# Patient Record
Sex: Female | Born: 1950
Health system: Southern US, Community
[De-identification: ages and names within clinical notes are randomized; demographics above are authoritative.]

## PROBLEM LIST (undated history)

## (undated) DIAGNOSIS — Z8042 Family history of malignant neoplasm of prostate: Secondary | ICD-10-CM

## (undated) DIAGNOSIS — Z860100 Personal history of colon polyps, unspecified: Secondary | ICD-10-CM

## (undated) DIAGNOSIS — Z8041 Family history of malignant neoplasm of ovary: Secondary | ICD-10-CM

## (undated) DIAGNOSIS — T7840XA Allergy, unspecified, initial encounter: Secondary | ICD-10-CM

## (undated) DIAGNOSIS — F32A Depression, unspecified: Secondary | ICD-10-CM

## (undated) DIAGNOSIS — C801 Malignant (primary) neoplasm, unspecified: Secondary | ICD-10-CM

## (undated) DIAGNOSIS — H8110 Benign paroxysmal vertigo, unspecified ear: Secondary | ICD-10-CM

## (undated) DIAGNOSIS — E785 Hyperlipidemia, unspecified: Secondary | ICD-10-CM

## (undated) DIAGNOSIS — IMO0001 Reserved for inherently not codable concepts without codable children: Secondary | ICD-10-CM

## (undated) DIAGNOSIS — F419 Anxiety disorder, unspecified: Secondary | ICD-10-CM

## (undated) DIAGNOSIS — F329 Major depressive disorder, single episode, unspecified: Secondary | ICD-10-CM

## (undated) DIAGNOSIS — Z803 Family history of malignant neoplasm of breast: Secondary | ICD-10-CM

## (undated) DIAGNOSIS — E669 Obesity, unspecified: Secondary | ICD-10-CM

## (undated) DIAGNOSIS — M1990 Unspecified osteoarthritis, unspecified site: Secondary | ICD-10-CM

## (undated) DIAGNOSIS — K219 Gastro-esophageal reflux disease without esophagitis: Secondary | ICD-10-CM

## (undated) DIAGNOSIS — IMO0002 Reserved for concepts with insufficient information to code with codable children: Secondary | ICD-10-CM

## (undated) DIAGNOSIS — M545 Low back pain: Secondary | ICD-10-CM

## (undated) DIAGNOSIS — M8580 Other specified disorders of bone density and structure, unspecified site: Secondary | ICD-10-CM

## (undated) DIAGNOSIS — Z9221 Personal history of antineoplastic chemotherapy: Secondary | ICD-10-CM

## (undated) DIAGNOSIS — C541 Malignant neoplasm of endometrium: Secondary | ICD-10-CM

## (undated) DIAGNOSIS — E119 Type 2 diabetes mellitus without complications: Secondary | ICD-10-CM

## (undated) DIAGNOSIS — R7303 Prediabetes: Secondary | ICD-10-CM

## (undated) DIAGNOSIS — I10 Essential (primary) hypertension: Secondary | ICD-10-CM

## (undated) DIAGNOSIS — Z800 Family history of malignant neoplasm of digestive organs: Secondary | ICD-10-CM

## (undated) DIAGNOSIS — Z8719 Personal history of other diseases of the digestive system: Secondary | ICD-10-CM

## (undated) DIAGNOSIS — E079 Disorder of thyroid, unspecified: Secondary | ICD-10-CM

## (undated) DIAGNOSIS — I8310 Varicose veins of unspecified lower extremity with inflammation: Secondary | ICD-10-CM

## (undated) HISTORY — DX: Depression, unspecified: F32.A

## (undated) HISTORY — DX: Personal history of other diseases of the digestive system: Z87.19

## (undated) HISTORY — DX: Low back pain: M54.5

## (undated) HISTORY — PX: TUBAL LIGATION: SHX77

## (undated) HISTORY — PX: COLONOSCOPY: SHX174

## (undated) HISTORY — DX: Disorder of thyroid, unspecified: E07.9

## (undated) HISTORY — PX: POLYPECTOMY: SHX149

## (undated) HISTORY — DX: Hyperlipidemia, unspecified: E78.5

## (undated) HISTORY — DX: Varicose veins of unspecified lower extremity with inflammation: I83.10

## (undated) HISTORY — DX: Family history of malignant neoplasm of ovary: Z80.41

## (undated) HISTORY — DX: Family history of malignant neoplasm of prostate: Z80.42

## (undated) HISTORY — DX: Personal history of colonic polyps: Z86.010

## (undated) HISTORY — DX: Essential (primary) hypertension: I10

## (undated) HISTORY — DX: Major depressive disorder, single episode, unspecified: F32.9

## (undated) HISTORY — DX: Family history of malignant neoplasm of digestive organs: Z80.0

## (undated) HISTORY — DX: Family history of malignant neoplasm of breast: Z80.3

## (undated) HISTORY — DX: Prediabetes: R73.03

## (undated) HISTORY — DX: Obesity, unspecified: E66.9

## (undated) HISTORY — DX: Other specified disorders of bone density and structure, unspecified site: M85.80

## (undated) HISTORY — PX: DILATION AND CURETTAGE OF UTERUS: SHX78

## (undated) HISTORY — DX: Gastro-esophageal reflux disease without esophagitis: K21.9

## (undated) HISTORY — DX: Type 2 diabetes mellitus without complications: E11.9

## (undated) HISTORY — DX: Benign paroxysmal vertigo, unspecified ear: H81.10

## (undated) HISTORY — DX: Personal history of colon polyps, unspecified: Z86.0100

## (undated) HISTORY — DX: Allergy, unspecified, initial encounter: T78.40XA

## (undated) HISTORY — DX: Unspecified osteoarthritis, unspecified site: M19.90

---

## 1989-07-23 HISTORY — PX: CHOLECYSTECTOMY: SHX55

## 1999-05-22 ENCOUNTER — Other Ambulatory Visit: Admission: RE | Admit: 1999-05-22 | Discharge: 1999-05-22 | Payer: Self-pay | Admitting: Internal Medicine

## 2000-06-17 ENCOUNTER — Other Ambulatory Visit: Admission: RE | Admit: 2000-06-17 | Discharge: 2000-06-17 | Payer: Self-pay | Admitting: Internal Medicine

## 2001-03-03 ENCOUNTER — Ambulatory Visit (HOSPITAL_COMMUNITY): Admission: RE | Admit: 2001-03-03 | Discharge: 2001-03-03 | Payer: Self-pay | Admitting: Gastroenterology

## 2001-03-03 ENCOUNTER — Encounter (INDEPENDENT_AMBULATORY_CARE_PROVIDER_SITE_OTHER): Payer: Self-pay | Admitting: *Deleted

## 2001-07-08 ENCOUNTER — Other Ambulatory Visit: Admission: RE | Admit: 2001-07-08 | Discharge: 2001-07-08 | Payer: Self-pay | Admitting: Neurosurgery

## 2002-11-11 ENCOUNTER — Ambulatory Visit (HOSPITAL_COMMUNITY): Admission: RE | Admit: 2002-11-11 | Discharge: 2002-11-11 | Payer: Self-pay | Admitting: Urology

## 2002-11-29 ENCOUNTER — Ambulatory Visit (HOSPITAL_COMMUNITY): Admission: RE | Admit: 2002-11-29 | Discharge: 2002-11-29 | Payer: Self-pay | Admitting: Internal Medicine

## 2002-11-29 ENCOUNTER — Encounter: Payer: Self-pay | Admitting: Internal Medicine

## 2003-01-27 ENCOUNTER — Encounter (INDEPENDENT_AMBULATORY_CARE_PROVIDER_SITE_OTHER): Payer: Self-pay | Admitting: Specialist

## 2003-01-27 ENCOUNTER — Ambulatory Visit (HOSPITAL_COMMUNITY): Admission: RE | Admit: 2003-01-27 | Discharge: 2003-01-27 | Payer: Self-pay

## 2003-08-13 ENCOUNTER — Other Ambulatory Visit: Admission: RE | Admit: 2003-08-13 | Discharge: 2003-08-13 | Payer: Self-pay | Admitting: Obstetrics and Gynecology

## 2004-08-16 ENCOUNTER — Other Ambulatory Visit: Admission: RE | Admit: 2004-08-16 | Discharge: 2004-08-16 | Payer: Self-pay | Admitting: Internal Medicine

## 2005-08-31 ENCOUNTER — Other Ambulatory Visit: Admission: RE | Admit: 2005-08-31 | Discharge: 2005-08-31 | Payer: Self-pay | Admitting: Internal Medicine

## 2006-02-01 ENCOUNTER — Ambulatory Visit: Payer: Self-pay | Admitting: Family Medicine

## 2006-03-04 ENCOUNTER — Ambulatory Visit: Payer: Self-pay | Admitting: Family Medicine

## 2006-03-18 ENCOUNTER — Ambulatory Visit: Payer: Self-pay | Admitting: Internal Medicine

## 2006-03-19 ENCOUNTER — Ambulatory Visit: Payer: Self-pay | Admitting: *Deleted

## 2006-03-29 ENCOUNTER — Ambulatory Visit: Payer: Self-pay | Admitting: Family Medicine

## 2006-05-10 ENCOUNTER — Ambulatory Visit: Payer: Self-pay | Admitting: Family Medicine

## 2006-05-10 LAB — CONVERTED CEMR LAB
CO2: 31 meq/L (ref 19–32)
Chloride: 102 meq/L (ref 96–112)
Creatinine, Ser: 0.8 mg/dL (ref 0.4–1.2)
GFR calc non Af Amer: 79 mL/min
Glomerular Filtration Rate, Af Am: 96 mL/min/{1.73_m2}
Potassium: 3.8 meq/L (ref 3.5–5.1)

## 2006-08-13 DIAGNOSIS — J45909 Unspecified asthma, uncomplicated: Secondary | ICD-10-CM | POA: Insufficient documentation

## 2006-08-13 DIAGNOSIS — I10 Essential (primary) hypertension: Secondary | ICD-10-CM

## 2006-08-13 HISTORY — DX: Essential (primary) hypertension: I10

## 2006-08-28 ENCOUNTER — Ambulatory Visit: Payer: Self-pay | Admitting: Family Medicine

## 2006-08-30 ENCOUNTER — Emergency Department (HOSPITAL_COMMUNITY): Admission: EM | Admit: 2006-08-30 | Discharge: 2006-08-30 | Payer: Self-pay | Admitting: Emergency Medicine

## 2006-09-02 ENCOUNTER — Ambulatory Visit: Payer: Self-pay | Admitting: Family Medicine

## 2006-09-05 ENCOUNTER — Ambulatory Visit: Payer: Self-pay | Admitting: Family Medicine

## 2006-09-13 ENCOUNTER — Ambulatory Visit: Payer: Self-pay | Admitting: Family Medicine

## 2006-09-13 LAB — CONVERTED CEMR LAB
CO2: 31 meq/L (ref 19–32)
Calcium: 9.4 mg/dL (ref 8.4–10.5)
Chloride: 104 meq/L (ref 96–112)
Cholesterol: 222 mg/dL (ref 0–200)
GFR calc Af Amer: 96 mL/min
GFR calc non Af Amer: 79 mL/min
Potassium: 3.9 meq/L (ref 3.5–5.1)
Sodium: 142 meq/L (ref 135–145)
Total CHOL/HDL Ratio: 3.7
VLDL: 20 mg/dL (ref 0–40)

## 2006-10-04 ENCOUNTER — Ambulatory Visit: Payer: Self-pay | Admitting: Gastroenterology

## 2006-10-18 ENCOUNTER — Ambulatory Visit: Payer: Self-pay | Admitting: Gastroenterology

## 2006-11-15 ENCOUNTER — Ambulatory Visit: Payer: Self-pay | Admitting: Family Medicine

## 2006-11-15 LAB — CONVERTED CEMR LAB
Cholesterol: 169 mg/dL (ref 0–200)
HDL: 51.6 mg/dL (ref >39.0)
Total CHOL/HDL Ratio: 3.3
VLDL: 16 mg/dL (ref 0–40)

## 2007-01-14 ENCOUNTER — Telehealth (INDEPENDENT_AMBULATORY_CARE_PROVIDER_SITE_OTHER): Payer: Self-pay | Admitting: *Deleted

## 2007-01-15 ENCOUNTER — Ambulatory Visit: Payer: Self-pay | Admitting: Vascular Surgery

## 2007-01-15 ENCOUNTER — Ambulatory Visit: Payer: Self-pay | Admitting: Internal Medicine

## 2007-01-15 DIAGNOSIS — L02419 Cutaneous abscess of limb, unspecified: Secondary | ICD-10-CM

## 2007-01-15 DIAGNOSIS — L03119 Cellulitis of unspecified part of limb: Secondary | ICD-10-CM

## 2007-01-16 ENCOUNTER — Telehealth: Payer: Self-pay | Admitting: Internal Medicine

## 2007-01-17 ENCOUNTER — Encounter: Payer: Self-pay | Admitting: Internal Medicine

## 2007-01-23 ENCOUNTER — Ambulatory Visit: Payer: Self-pay | Admitting: Internal Medicine

## 2007-01-29 ENCOUNTER — Telehealth: Payer: Self-pay | Admitting: Internal Medicine

## 2007-01-30 ENCOUNTER — Encounter: Payer: Self-pay | Admitting: Internal Medicine

## 2007-01-30 ENCOUNTER — Encounter (INDEPENDENT_AMBULATORY_CARE_PROVIDER_SITE_OTHER): Payer: Self-pay | Admitting: *Deleted

## 2007-01-30 ENCOUNTER — Ambulatory Visit: Payer: Self-pay

## 2007-01-31 ENCOUNTER — Encounter: Payer: Self-pay | Admitting: Internal Medicine

## 2007-02-03 ENCOUNTER — Ambulatory Visit: Payer: Self-pay | Admitting: Internal Medicine

## 2007-02-03 ENCOUNTER — Encounter (INDEPENDENT_AMBULATORY_CARE_PROVIDER_SITE_OTHER): Payer: Self-pay | Admitting: *Deleted

## 2007-02-11 ENCOUNTER — Ambulatory Visit: Payer: Self-pay | Admitting: Family Medicine

## 2007-03-20 ENCOUNTER — Ambulatory Visit: Payer: Self-pay | Admitting: Family Medicine

## 2007-03-25 ENCOUNTER — Telehealth (INDEPENDENT_AMBULATORY_CARE_PROVIDER_SITE_OTHER): Payer: Self-pay | Admitting: *Deleted

## 2007-03-25 LAB — CONVERTED CEMR LAB
Calcium: 9.4 mg/dL (ref 8.4–10.5)
Cholesterol: 190 mg/dL (ref 0–200)
VLDL: 16 mg/dL (ref 0–40)

## 2007-06-24 ENCOUNTER — Ambulatory Visit: Payer: Self-pay | Admitting: Family Medicine

## 2007-06-24 ENCOUNTER — Encounter: Payer: Self-pay | Admitting: Family Medicine

## 2007-06-24 LAB — CONVERTED CEMR LAB
BUN: 10 mg/dL (ref 6–23)
CO2: 31 meq/L (ref 19–32)
Chloride: 103 meq/L (ref 96–112)
Creatinine, Ser: 0.9 mg/dL (ref 0.4–1.2)
GFR calc Af Amer: 84 mL/min
Potassium: 4.4 meq/L (ref 3.5–5.1)

## 2007-06-25 ENCOUNTER — Telehealth (INDEPENDENT_AMBULATORY_CARE_PROVIDER_SITE_OTHER): Payer: Self-pay | Admitting: *Deleted

## 2007-07-02 ENCOUNTER — Telehealth (INDEPENDENT_AMBULATORY_CARE_PROVIDER_SITE_OTHER): Payer: Self-pay | Admitting: *Deleted

## 2007-08-11 ENCOUNTER — Encounter (INDEPENDENT_AMBULATORY_CARE_PROVIDER_SITE_OTHER): Payer: Self-pay | Admitting: Family Medicine

## 2007-09-26 ENCOUNTER — Other Ambulatory Visit: Admission: RE | Admit: 2007-09-26 | Discharge: 2007-09-26 | Payer: Self-pay | Admitting: Family Medicine

## 2007-09-26 ENCOUNTER — Encounter (INDEPENDENT_AMBULATORY_CARE_PROVIDER_SITE_OTHER): Payer: Self-pay | Admitting: *Deleted

## 2007-09-26 ENCOUNTER — Ambulatory Visit: Payer: Self-pay | Admitting: Family Medicine

## 2007-09-26 ENCOUNTER — Encounter (INDEPENDENT_AMBULATORY_CARE_PROVIDER_SITE_OTHER): Payer: Self-pay | Admitting: Family Medicine

## 2007-09-26 LAB — CONVERTED CEMR LAB
BUN: 11 mg/dL (ref 6–23)
Calcium: 9.7 mg/dL (ref 8.4–10.5)
Cholesterol: 207 mg/dL (ref 0–200)
Creatinine, Ser: 0.9 mg/dL (ref 0.4–1.2)
Direct LDL: 124.4 mg/dL
GFR calc non Af Amer: 69 mL/min
HDL: 53.9 mg/dL (ref >39.0)
Hemoglobin: 13.1 g/dL (ref 12.0–15.0)
Potassium: 4.2 meq/L (ref 3.5–5.1)
Sodium: 142 meq/L (ref 135–145)
TSH: 3.06 microintl units/mL (ref 0.35–5.50)
Triglycerides: 114 mg/dL (ref 0–149)
VLDL: 23 mg/dL (ref 0–40)

## 2007-10-01 ENCOUNTER — Telehealth (INDEPENDENT_AMBULATORY_CARE_PROVIDER_SITE_OTHER): Payer: Self-pay | Admitting: *Deleted

## 2007-11-25 ENCOUNTER — Encounter (INDEPENDENT_AMBULATORY_CARE_PROVIDER_SITE_OTHER): Payer: Self-pay | Admitting: *Deleted

## 2007-12-19 ENCOUNTER — Ambulatory Visit: Payer: Self-pay | Admitting: Internal Medicine

## 2007-12-19 DIAGNOSIS — R1031 Right lower quadrant pain: Secondary | ICD-10-CM

## 2007-12-19 LAB — CONVERTED CEMR LAB
Eosinophils Absolute: 0.1 10*3/uL (ref 0.0–0.7)
Eosinophils Relative: 2 % (ref 0–5)
Hemoglobin: 12 g/dL (ref 12.0–15.0)
Lymphs Abs: 2 10*3/uL (ref 0.7–4.0)
MCV: 66.3 fL — ABNORMAL LOW (ref 78.0–100.0)
Neutrophils Relative %: 43 % (ref 43–77)
Platelets: 260 10*3/uL (ref 150–400)
RBC: 5.87 M/uL — ABNORMAL HIGH (ref 3.87–5.11)
WBC: 4.5 10*3/uL (ref 4.0–10.5)

## 2007-12-26 ENCOUNTER — Ambulatory Visit: Payer: Self-pay | Admitting: Internal Medicine

## 2008-01-13 ENCOUNTER — Encounter (INDEPENDENT_AMBULATORY_CARE_PROVIDER_SITE_OTHER): Payer: Self-pay | Admitting: *Deleted

## 2008-01-13 ENCOUNTER — Ambulatory Visit: Payer: Self-pay | Admitting: Internal Medicine

## 2008-01-13 DIAGNOSIS — R109 Unspecified abdominal pain: Secondary | ICD-10-CM | POA: Insufficient documentation

## 2008-01-14 ENCOUNTER — Ambulatory Visit: Payer: Self-pay | Admitting: Internal Medicine

## 2008-01-19 ENCOUNTER — Ambulatory Visit: Payer: Self-pay | Admitting: Cardiology

## 2008-01-20 ENCOUNTER — Ambulatory Visit: Payer: Self-pay | Admitting: Internal Medicine

## 2008-01-20 ENCOUNTER — Encounter (INDEPENDENT_AMBULATORY_CARE_PROVIDER_SITE_OTHER): Payer: Self-pay | Admitting: *Deleted

## 2008-01-20 LAB — CONVERTED CEMR LAB
OCCULT 1: NEGATIVE
OCCULT 2: NEGATIVE
OCCULT 3: NEGATIVE

## 2008-01-22 ENCOUNTER — Encounter (INDEPENDENT_AMBULATORY_CARE_PROVIDER_SITE_OTHER): Payer: Self-pay | Admitting: *Deleted

## 2008-01-22 LAB — CONVERTED CEMR LAB
ALT: 32 units/L (ref 0–35)
Albumin: 3.6 g/dL (ref 3.5–5.2)
Alkaline Phosphatase: 82 units/L (ref 39–117)
Bilirubin, Direct: 0.1 mg/dL (ref 0.0–0.3)
HCT: 38.2 % (ref 36.0–46.0)
Lipase: 12 units/L (ref 11.0–59.0)
MCV: 66.7 fL — ABNORMAL LOW (ref 78.0–100.0)
Neutrophils Relative %: 47.4 % (ref 43.0–77.0)
RDW: 15.1 % — ABNORMAL HIGH (ref 11.5–14.6)
Total Bilirubin: 0.6 mg/dL (ref 0.3–1.2)

## 2008-01-26 ENCOUNTER — Telehealth (INDEPENDENT_AMBULATORY_CARE_PROVIDER_SITE_OTHER): Payer: Self-pay | Admitting: *Deleted

## 2008-01-29 ENCOUNTER — Encounter (INDEPENDENT_AMBULATORY_CARE_PROVIDER_SITE_OTHER): Payer: Self-pay | Admitting: *Deleted

## 2008-03-10 DIAGNOSIS — Z860100 Personal history of colon polyps, unspecified: Secondary | ICD-10-CM

## 2008-03-10 HISTORY — DX: Personal history of colon polyps, unspecified: Z86.0100

## 2008-03-11 ENCOUNTER — Ambulatory Visit: Payer: Self-pay | Admitting: Gastroenterology

## 2008-03-11 DIAGNOSIS — E669 Obesity, unspecified: Secondary | ICD-10-CM | POA: Insufficient documentation

## 2008-03-11 DIAGNOSIS — K766 Portal hypertension: Secondary | ICD-10-CM

## 2008-04-16 ENCOUNTER — Ambulatory Visit: Payer: Self-pay | Admitting: Family Medicine

## 2008-05-07 ENCOUNTER — Ambulatory Visit: Payer: Self-pay | Admitting: Family Medicine

## 2008-05-25 ENCOUNTER — Telehealth (INDEPENDENT_AMBULATORY_CARE_PROVIDER_SITE_OTHER): Payer: Self-pay | Admitting: *Deleted

## 2008-08-06 ENCOUNTER — Ambulatory Visit: Payer: Self-pay | Admitting: Family Medicine

## 2008-08-13 ENCOUNTER — Encounter: Payer: Self-pay | Admitting: Family Medicine

## 2008-08-16 ENCOUNTER — Telehealth (INDEPENDENT_AMBULATORY_CARE_PROVIDER_SITE_OTHER): Payer: Self-pay | Admitting: *Deleted

## 2008-08-23 ENCOUNTER — Encounter (INDEPENDENT_AMBULATORY_CARE_PROVIDER_SITE_OTHER): Payer: Self-pay | Admitting: *Deleted

## 2008-08-23 LAB — CONVERTED CEMR LAB
Albumin: 3.7 g/dL (ref 3.5–5.2)
BUN: 9 mg/dL (ref 6–23)
Bilirubin, Direct: 0.1 mg/dL (ref 0.0–0.3)
CO2: 31 meq/L (ref 19–32)
Calcium: 9.4 mg/dL (ref 8.4–10.5)
Chloride: 104 meq/L (ref 96–112)
Cholesterol: 196 mg/dL (ref 0–200)
Creatinine, Ser: 0.8 mg/dL (ref 0.4–1.2)
GFR calc Af Amer: 95 mL/min
LDL Cholesterol: 126 mg/dL — ABNORMAL HIGH (ref 0–99)
Potassium: 4.2 meq/L (ref 3.5–5.1)
Sodium: 142 meq/L (ref 135–145)
Total Bilirubin: 0.5 mg/dL (ref 0.3–1.2)
Total Protein: 7.8 g/dL (ref 6.0–8.3)
Triglycerides: 104 mg/dL (ref 0–149)
VLDL: 21 mg/dL (ref 0–40)

## 2008-09-30 ENCOUNTER — Encounter: Payer: Self-pay | Admitting: Family Medicine

## 2008-10-01 ENCOUNTER — Ambulatory Visit: Payer: Self-pay | Admitting: Family Medicine

## 2008-10-01 ENCOUNTER — Encounter: Payer: Self-pay | Admitting: Family Medicine

## 2008-10-01 ENCOUNTER — Other Ambulatory Visit: Admission: RE | Admit: 2008-10-01 | Discharge: 2008-10-01 | Payer: Self-pay | Admitting: Family Medicine

## 2008-10-01 DIAGNOSIS — Z780 Asymptomatic menopausal state: Secondary | ICD-10-CM

## 2008-10-01 HISTORY — DX: Asymptomatic menopausal state: Z78.0

## 2008-10-08 ENCOUNTER — Ambulatory Visit (HOSPITAL_COMMUNITY): Admission: RE | Admit: 2008-10-08 | Discharge: 2008-10-08 | Payer: Self-pay | Admitting: Family Medicine

## 2008-10-08 ENCOUNTER — Encounter: Payer: Self-pay | Admitting: Family Medicine

## 2008-10-11 ENCOUNTER — Encounter (INDEPENDENT_AMBULATORY_CARE_PROVIDER_SITE_OTHER): Payer: Self-pay | Admitting: *Deleted

## 2008-10-18 ENCOUNTER — Encounter (INDEPENDENT_AMBULATORY_CARE_PROVIDER_SITE_OTHER): Payer: Self-pay | Admitting: *Deleted

## 2009-04-26 ENCOUNTER — Ambulatory Visit: Payer: Self-pay | Admitting: Family Medicine

## 2009-12-06 ENCOUNTER — Encounter: Payer: Self-pay | Admitting: Emergency Medicine

## 2009-12-06 ENCOUNTER — Inpatient Hospital Stay (HOSPITAL_COMMUNITY): Admission: EM | Admit: 2009-12-06 | Discharge: 2009-12-08 | Payer: Self-pay | Admitting: Internal Medicine

## 2009-12-06 ENCOUNTER — Ambulatory Visit: Payer: Self-pay | Admitting: Internal Medicine

## 2009-12-06 ENCOUNTER — Ambulatory Visit: Payer: Self-pay | Admitting: Diagnostic Radiology

## 2009-12-08 ENCOUNTER — Encounter: Payer: Self-pay | Admitting: Internal Medicine

## 2009-12-13 ENCOUNTER — Encounter: Payer: Self-pay | Admitting: Emergency Medicine

## 2009-12-13 ENCOUNTER — Ambulatory Visit: Payer: Self-pay | Admitting: Diagnostic Radiology

## 2009-12-13 ENCOUNTER — Observation Stay (HOSPITAL_COMMUNITY): Admission: AD | Admit: 2009-12-13 | Discharge: 2009-12-15 | Payer: Self-pay | Admitting: Internal Medicine

## 2010-01-10 ENCOUNTER — Ambulatory Visit (HOSPITAL_BASED_OUTPATIENT_CLINIC_OR_DEPARTMENT_OTHER)
Admission: RE | Admit: 2010-01-10 | Discharge: 2010-01-10 | Payer: Self-pay | Source: Home / Self Care | Admitting: Internal Medicine

## 2010-01-10 ENCOUNTER — Ambulatory Visit: Payer: Self-pay | Admitting: Diagnostic Radiology

## 2010-06-12 ENCOUNTER — Encounter: Payer: Self-pay | Admitting: Internal Medicine

## 2010-06-12 LAB — CONVERTED CEMR LAB: Pap Smear: NORMAL

## 2010-08-15 ENCOUNTER — Ambulatory Visit (INDEPENDENT_AMBULATORY_CARE_PROVIDER_SITE_OTHER)
Admission: RE | Admit: 2010-08-15 | Discharge: 2010-08-15 | Payer: Private Health Insurance - Indemnity | Source: Home / Self Care | Attending: Internal Medicine | Admitting: Internal Medicine

## 2010-08-15 DIAGNOSIS — Z1231 Encounter for screening mammogram for malignant neoplasm of breast: Secondary | ICD-10-CM

## 2010-08-15 LAB — HM MAMMOGRAPHY: HM Mammogram: NORMAL

## 2010-08-20 LAB — CONVERTED CEMR LAB
AST: 24 units/L (ref 0–37)
Albumin: 3.8 g/dL (ref 3.5–5.2)
Basophils Absolute: 0.1 10*3/uL (ref 0.0–0.1)
Basophils Relative: 1.5 % (ref 0.0–3.0)
Blood in Urine, dipstick: NEGATIVE
CO2: 31 meq/L (ref 19–32)
Cholesterol: 195 mg/dL (ref 0–200)
Eosinophils Absolute: 0.1 10*3/uL (ref 0.0–0.7)
GFR calc Af Amer: 95 mL/min
GFR calc non Af Amer: 79 mL/min
HCT: 39.9 % (ref 36.0–46.0)
HDL: 49.4 mg/dL (ref >39.0)
Monocytes Absolute: 0.5 10*3/uL (ref 0.1–1.0)
Neutro Abs: 2.2 10*3/uL (ref 1.4–7.7)
Nitrite: NEGATIVE
Platelets: 216 10*3/uL (ref 150–400)
Protein, U semiquant: NEGATIVE
RDW: 15.5 % — ABNORMAL HIGH (ref 11.5–14.6)
TSH: 3.56 microintl units/mL (ref 0.35–5.50)
Total Protein: 7.6 g/dL (ref 6.0–8.3)
Urobilinogen, UA: NEGATIVE
VLDL: 21 mg/dL (ref 0–40)
WBC Urine, dipstick: NEGATIVE
WBC: 4.9 10*3/uL (ref 4.5–10.5)
pH: 7

## 2010-08-22 NOTE — Letter (Signed)
Summary: Chief Financial Officer Insurance Certification Notification   Imported By: Roderic Ovens 02/07/2010 11:54:24  _____________________________________________________________________  External Attachment:    Type:   Image     Comment:   External Document

## 2010-09-14 ENCOUNTER — Ambulatory Visit (INDEPENDENT_AMBULATORY_CARE_PROVIDER_SITE_OTHER): Payer: Private Health Insurance - Indemnity | Admitting: Internal Medicine

## 2010-09-14 ENCOUNTER — Encounter: Payer: Self-pay | Admitting: Internal Medicine

## 2010-09-14 DIAGNOSIS — R7309 Other abnormal glucose: Secondary | ICD-10-CM | POA: Insufficient documentation

## 2010-09-14 DIAGNOSIS — Z8719 Personal history of other diseases of the digestive system: Secondary | ICD-10-CM

## 2010-09-14 DIAGNOSIS — K219 Gastro-esophageal reflux disease without esophagitis: Secondary | ICD-10-CM

## 2010-09-14 DIAGNOSIS — I10 Essential (primary) hypertension: Secondary | ICD-10-CM

## 2010-09-14 HISTORY — DX: Gastro-esophageal reflux disease without esophagitis: K21.9

## 2010-09-15 ENCOUNTER — Encounter: Payer: Self-pay | Admitting: Internal Medicine

## 2010-09-15 LAB — CONVERTED CEMR LAB
ALT: 22 units/L (ref 0–35)
BUN: 12 mg/dL (ref 6–23)
Cholesterol: 198 mg/dL (ref 0–200)
Free T4: 1.13 ng/dL (ref 0.80–1.80)
Glucose, Bld: 91 mg/dL (ref 70–99)
HDL: 57 mg/dL (ref >39)
MCHC: 31.5 g/dL (ref 30.0–36.0)
Platelets: 225 10*3/uL (ref 150–400)
Potassium: 4.5 meq/L (ref 3.5–5.3)
RBC: 5.96 M/uL — ABNORMAL HIGH (ref 3.87–5.11)
RDW: 15.9 % — ABNORMAL HIGH (ref 11.5–15.5)
Total Protein: 6.8 g/dL (ref 6.0–8.3)
WBC: 4.9 10*3/uL (ref 4.0–10.5)

## 2010-09-18 ENCOUNTER — Telehealth: Payer: Self-pay | Admitting: Internal Medicine

## 2010-09-18 ENCOUNTER — Encounter: Payer: Self-pay | Admitting: Internal Medicine

## 2010-09-28 NOTE — Progress Notes (Signed)
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  as discussed at previous visit, patient should avoid concentrated sweets and limit carbohydrate intake to 25-30 g per meal.   daily  walking program can also help progression to diabetes  Initial call taken by: D. Thomos Lemons DO,  September 18, 2010 3:15 PM  Follow-up for Phone Call        Pt notified. Nicki Guadalajara Fergerson CMA Duncan Dull)  September 18, 2010 4:11 PM

## 2010-09-28 NOTE — Letter (Signed)
    at [facility] [address] [place], [place]  [address]  [place] Phone: [phone]      September 18, 2010   [facility] [address] [place], [place] [address]  RE:  LAB RESULTS  Dear  Ms. Fikes,  The following is an interpretation of your most recent lab tests.  Please take note of any instructions provided or changes to medications that have resulted from your lab work.  ELECTROLYTES:  Good - no changes needed  KIDNEY FUNCTION TESTS:  Good - no changes needed  LIVER FUNCTION TESTS:  Good - no changes needed  LIPID PANEL:  Fair - review at your next visit Triglyceride: 77   Cholesterol: 198   LDL: 126   HDL: 57   Chol/HDL%:  3.5 Ratio  THYROID STUDIES:  Thyroid studies normal TSH: 3.985     DIABETIC STUDIES:  Fair - schedule a follow-up appointment Blood Glucose: 91   HgbA1C: 6.4     CBC:  Good - no changes needed       Sincerely Yours,    Dr. Thomos Lemons  Appended Document:  mailed

## 2010-10-03 NOTE — Assessment & Plan Note (Signed)
Summary: new to est/aetna/pkg/ss   Vital Signs:  Patient profile:   60 year old female Menstrual status:  postmenopausal Height:      62 inches Weight:      249.75 pounds BMI:     45.84 O2 Sat:      100 % on Room air Temp:     97.7 degrees F oral Pulse rate:   70 / minute Resp:     18 per minute BP sitting:   130 / 90  (left arm)  Vitals Entered By: Glendell Docker CMA (September 14, 2010 9:43 AM)  O2 Flow:  Room air CC: New Patient Is Patient Diabetic? No Pain Assessment Patient in pain? no      Comments general assessment     Menstrual Status postmenopausal Last PAP Result normal   Primary Care Provider:  Dondra Spry DO  CC:  New Patient.  History of Present Illness: 60 y/o female to establish she has been getting ESI for low back pain  she has been going to dermatologist  for dark spots on legs - lipodermatosclerosis  htn -  poor control despite taking current med regimen denies hx of CAD or CVA    Preventive Screening-Counseling & Management  Alcohol-Tobacco     Alcohol drinks/day: 0     Smoking Status: never  Caffeine-Diet-Exercise     Caffeine use/day: None     Does Patient Exercise: yes     Times/week: 3  Allergies: 1)  * Penicillins Group 2)  * Iodine; Iodine Containing Group  Past History:  Past Medical History: Asthma Hypertension Obestiy Colonic polyps, hx of GERD Diverticulitis, hx of   Past Surgical History: Cholecystectomy  1991 D&C Tubal ligation Appendectomy    Social History: Married- 40 years Never Smoked Alcohol use-no Drug use-no Daily Caffeine Use Occupation: housewife Caffeine use/day:  None Does Patient Exercise:  yes  Review of Systems       The patient complains of weight gain.  The patient denies fever, chest pain, syncope, abdominal pain, severe indigestion/heartburn, and depression.         snoring,  daytime somnolence  Physical Exam  General:  alert and overweight-appearing.   Head:   normocephalic and atraumatic.   Eyes:  pupils equal, pupils round, and pupils reactive to light.   Ears:  R ear normal and L ear normal.   Mouth:  pharynx pink and moist.   Neck:  No deformities, masses, or tenderness noted. acanthosis nigracans Lungs:  normal respiratory effort, normal breath sounds, no crackles, and no wheezes.   Heart:  normal rate, regular rhythm, and no gallop.   Abdomen:  obese, soft, non-tender, no hepatomegaly, and no splenomegaly.   Extremities:  trace left pedal edema and trace right pedal edema.   Neurologic:  cranial nerves II-XII intact and gait normal.   Skin:  acanthosis nigricans - neck   Impression & Recommendations:  Problem # 1:  HYPERTENSION (ICD-401.9) Assessment Deteriorated stop maxzide and lopressor use bystolic and lotrel  The following medications were removed from the medication list:    Amlodipine Besylate 10 Mg Tabs (Amlodipine besylate) .Marland Kitchen... 1 tablet  by mouth once daily    Triamterene-hctz 37.5-25 Mg Tabs (Triamterene-hctz) .Marland Kitchen... 1 by mouth once daily    Lopressor 100 Mg Tabs (Metoprolol tartrate) .Marland Kitchen... 1 by mouth two times a day Her updated medication list for this problem includes:    Bystolic 10 Mg Tabs (Nebivolol hcl) .Marland Kitchen... Take 1 tablet by mouth once a  day    Amlodipine Besy-benazepril Hcl 10-40 Mg Caps (Amlodipine besy-benazepril hcl) .Marland Kitchen... Take 1 capsule by mouth once a day  Orders: T-Basic Metabolic Panel [id number]) T-Hepatic Function [phone]) T-Lipid Profile [id number]) T-CBC No Diff [id number]) T- Hemoglobin A1C ([id number]) T-TSH (02725-36644) T-T4, Free (03474-25956)  BP today: 130/90 Prior BP: 116/82 (04/26/2009)  Labs Reviewed: K+: 3.9 (10/01/2008) Creat: : 0.8 (10/01/2008)   Chol: 195 (10/01/2008)   HDL: 49.4 (10/01/2008)   LDL: 125 (10/01/2008)   TG: 105 (10/01/2008)  Problem # 2:  HYPERGLYCEMIA (ICD-790.29) screen for diabetes.  Pt counseled on diet and exercise.  Orders: T-Basic Metabolic  Panel ([id number]) T-Hepatic Function [id number]) T-Lipid Profile [id number]) T-CBC No Diff [id number]) T- Hemoglobin A1C (42706-23762) T-TSH (83151-76160) T-T4, Free [phone])  Problem # 3:  OBESITY, UNSPECIFIED (ICD-278.00) epworth sleepiness scale 13 Orders: Sleep Study (Sleep Study)  Complete Medication List: 1)  Vitamin B Complex-c Caps (B complex-c) .... Take 1 capsule by mouth once daily 2)  Benefiber Powd (Wheat dextrin) .Marland Kitchen.. 1 x a day 3)  Proair Hfa 108 (90 Base) Mcg/act Aers (Albuterol sulfate) .... 2 puffs by mouth two times a day as needed 4)  Meloxicam 15 Mg Tabs (Meloxicam) .... Take 1 tablet by mouth once a day 5)  Bystolic 10 Mg Tabs (Nebivolol hcl) .... Take 1 tablet by mouth once a day 6)  Amlodipine Besy-benazepril Hcl 10-40 Mg Caps (Amlodipine besy-benazepril hcl) .... Take 1 capsule by mouth once a day 7)  Nabumetone 500 Mg Tabs (Nabumetone) .... Take 1 tablet by mouth two times a day 8)  Gabapentin 300 Mg Caps (Gabapentin) .... Take 1 tablet by mouth three times a day 9)  Tramadol Hcl 50 Mg Tabs (Tramadol hcl) .... One tablet by mouth every 6 hours as needed 10)  Symbicort 160-4.5 Mcg/act Aero (Budesonide-formoterol fumarate) .... Inhale 2 puffs by mouth two times a day as needed 11)  Aspirin Low Dose 81 Mg Tabs (Aspirin) .... Take 1 tablet by mouth once a day 12)  Triamcinolone Acetonide 0.1 % Crea (Triamcinolone acetonide) .... Apply topically to affected area once daily 13)  Terbinafine Hcl 250 Mg Tabs (Terbinafine hcl) .... Take 1 tablet by mouth once a day 14)  Trental 400 Mg Cr-tabs (Pentoxifylline) .... Take 1 tablet by mouth three times a day 15)  Budeprion Xl 150 Mg Xr24h-tab (Bupropion hcl) .... Take 1 tablet by mouth once a day 16)  Trazodone Hcl 50 Mg Tabs (Trazodone hcl) .Marland Kitchen.. 1-4 tablets by mouth at bedtime 17)  Protonix 40 Mg Tbec (Pantoprazole sodium) .... Take 1 tablet by mouth once a day  Patient Instructions: 1)   http://[email or web address]-calorie-counter.com/ 2)  Try to limit your calories to 800 - 1000 cal per day 3)  Please schedule a follow-up appointment in 2 months. 4)  Avoid food products that contain refined sugar   Orders Added: 1)  T-Basic Metabolic Panel [[id number]] 2)  T-Hepatic Function [[id number]] 3)  T-Lipid Profile [[id number]] 4)  T-CBC No Diff [[id number]] 5)  T- Hemoglobin A1C [83036-23375] 6)  T-TSH [85462-70350] 7)  T-T4, Free [[id number]] 8)  Sleep Study [Sleep Study] 9)  New Patient Level III [71696]   Immunization History:  Influenza Immunization History:    Influenza:  historical (05/02/2010)   Immunization History:  Influenza Immunization History:    Influenza:  Historical (05/02/2010)    Preventive Care Screening  Mammogram:    Date:  08/15/2010    Results:  normal   Pap Smear:    Date:  06/12/2010  Results:  normal   Last Flu Shot:    Date:  05/02/2010    Results:  Historical    Current Allergies (reviewed today): * PENICILLINS GROUP * IODINE; IODINE CONTAINING GROUP

## 2010-10-09 ENCOUNTER — Telehealth: Payer: Self-pay | Admitting: Internal Medicine

## 2010-10-09 DIAGNOSIS — Z124 Encounter for screening for malignant neoplasm of cervix: Secondary | ICD-10-CM | POA: Insufficient documentation

## 2010-10-09 LAB — CARDIAC PANEL(CRET KIN+CKTOT+MB+TROPI)
CK, MB: 3.8 ng/mL (ref 0.3–4.0)
CK, MB: 4.2 ng/mL — ABNORMAL HIGH (ref 0.3–4.0)
Relative Index: 1.3 (ref 0.0–2.5)
Relative Index: 1.2 (ref 0.0–2.5)
Relative Index: 1.5 (ref 0.0–2.5)
Total CK: 312 U/L — ABNORMAL HIGH (ref 7–177)
Total CK: 314 U/L — ABNORMAL HIGH (ref 7–177)
Total CK: 272 U/L — ABNORMAL HIGH (ref 7–177)

## 2010-10-09 LAB — COMPREHENSIVE METABOLIC PANEL
ALT: 55 U/L — ABNORMAL HIGH (ref 0–35)
AST: 29 U/L (ref 0–37)
AST: 38 U/L — ABNORMAL HIGH (ref 0–37)
Albumin: 4.1 g/dL (ref 3.5–5.2)
Alkaline Phosphatase: 103 U/L (ref 39–117)
Alkaline Phosphatase: 88 U/L (ref 39–117)
Chloride: 107 mEq/L (ref 96–112)
GFR calc Af Amer: >60 mL/min (ref >60)
GFR calc Af Amer: >60 mL/min (ref >60)
Glucose, Bld: 99 mg/dL (ref 70–99)
Potassium: 3.9 mEq/L (ref 3.5–5.1)
Potassium: 4.2 mEq/L (ref 3.5–5.1)
Sodium: 142 mEq/L (ref 135–145)
Total Bilirubin: 0.3 mg/dL (ref 0.3–1.2)
Total Protein: 8.3 g/dL (ref 6.0–8.3)

## 2010-10-09 LAB — BASIC METABOLIC PANEL
BUN: 9 mg/dL (ref 6–23)
CO2: 27 mEq/L (ref 19–32)
Calcium: 8.7 mg/dL (ref 8.4–10.5)
Chloride: 112 mEq/L (ref 96–112)
GFR calc Af Amer: >60 mL/min (ref >60)
GFR calc non Af Amer: >60 mL/min (ref >60)
Glucose, Bld: 96 mg/dL (ref 70–99)
Glucose, Bld: 109 mg/dL — ABNORMAL HIGH (ref 70–99)
Potassium: 4.4 mEq/L (ref 3.5–5.1)
Sodium: 143 mEq/L (ref 135–145)

## 2010-10-09 LAB — DIFFERENTIAL
Basophils Absolute: 0.1 10*3/uL (ref 0.0–0.1)
Eosinophils Absolute: 0.1 10*3/uL (ref 0.0–0.7)
Lymphs Abs: 2.1 10*3/uL (ref 0.7–4.0)
Neutro Abs: 2.5 10*3/uL (ref 1.7–7.7)

## 2010-10-09 LAB — POCT CARDIAC MARKERS
Troponin i, poc: <0.05 ng/mL (ref 0.00–0.09)
Troponin i, poc: <0.05 ng/mL (ref 0.00–0.09)

## 2010-10-09 LAB — CBC
HCT: 36.4 % (ref 36.0–46.0)
HCT: 36.5 % (ref 36.0–46.0)
Hemoglobin: 11.5 g/dL — ABNORMAL LOW (ref 12.0–15.0)
Hemoglobin: 11.7 g/dL — ABNORMAL LOW (ref 12.0–15.0)
Hemoglobin: 12.7 g/dL (ref 12.0–15.0)
MCHC: 31.5 g/dL (ref 30.0–36.0)
MCHC: 31.5 g/dL (ref 30.0–36.0)
MCHC: 32.1 g/dL (ref 30.0–36.0)
MCV: 69 fL — ABNORMAL LOW (ref 78.0–100.0)
Platelets: 199 10*3/uL (ref 150–400)
Platelets: 197 10*3/uL (ref 150–400)
RBC: 5.35 MIL/uL — ABNORMAL HIGH (ref 3.87–5.11)
RBC: 5.81 MIL/uL — ABNORMAL HIGH (ref 3.87–5.11)
RBC: 5.96 MIL/uL — ABNORMAL HIGH (ref 3.87–5.11)
RDW: 16.3 % — ABNORMAL HIGH (ref 11.5–15.5)
RDW: 16.5 % — ABNORMAL HIGH (ref 11.5–15.5)
RDW: 15.7 % — ABNORMAL HIGH (ref 11.5–15.5)
RDW: 15.9 % — ABNORMAL HIGH (ref 11.5–15.5)
RDW: 14.5 % (ref 11.5–15.5)
WBC: 5.2 10*3/uL (ref 4.0–10.5)
WBC: 5.2 10*3/uL (ref 4.0–10.5)

## 2010-10-09 LAB — POCT B-TYPE NATRIURETIC PEPTIDE (BNP): B Natriuretic Peptide, POC: 20.2 pg/mL (ref 0–100)

## 2010-10-09 LAB — HEPARIN LEVEL (UNFRACTIONATED)
Heparin Unfractionated: 0.38 IU/mL (ref 0.30–0.70)
Heparin Unfractionated: 0.63 IU/mL (ref 0.30–0.70)

## 2010-10-09 LAB — HEPATITIS PANEL, ACUTE
HCV Ab: NEGATIVE
Hep A IgM: NEGATIVE
Hep B C IgM: NEGATIVE
Hepatitis B Surface Ag: NEGATIVE

## 2010-10-09 LAB — CLOSTRIDIUM DIFFICILE EIA

## 2010-10-09 LAB — OVA AND PARASITE EXAMINATION

## 2010-10-09 LAB — TSH: TSH: 2.528 u[IU]/mL (ref 0.350–4.500)

## 2010-10-09 LAB — HEMOGLOBIN A1C: Mean Plasma Glucose: 140 mg/dL — ABNORMAL HIGH (ref <117)

## 2010-10-09 LAB — LIPASE, BLOOD: Lipase: 174 U/L (ref 23–300)

## 2010-10-09 LAB — STOOL CULTURE

## 2010-10-18 ENCOUNTER — Ambulatory Visit (HOSPITAL_BASED_OUTPATIENT_CLINIC_OR_DEPARTMENT_OTHER): Payer: Private Health Insurance - Indemnity | Attending: Internal Medicine

## 2010-10-18 DIAGNOSIS — G4733 Obstructive sleep apnea (adult) (pediatric): Secondary | ICD-10-CM | POA: Insufficient documentation

## 2010-10-19 NOTE — Progress Notes (Signed)
Summary: Spotting  Phone Note Call from Patient Call back at Home Phone [phone]   Caller: Patient Call For: D. Thomos Lemons DO Summary of Call: patient called and left voice message stating she has not had a period for 6 years, and on Friday she started spotting. She would like to know what Dr Artist Pais advises.  Glendell Docker CMA,  October 09, 2010 10:50 AM  call was returned to patient at [phone], no answer. A voice message was left informing patient call was returned Initial call taken by: Glendell Docker CMA,  October 09, 2010 10:52 AM  Follow-up for Phone Call        patient returned phone call stating she has dark red bleeding on Friday. She states she has had to use a regular pad,and the bleeding has eased off to bright red. She wanted to know if the bleeding she was experiencing was normal. She was asked if she had a gynecologist that she could follow up wtih with abnormal vaginal bleeding. She states she does not have a Gyn. She was informed that  I would call her back with what  Dr Artist Pais advises. Follow-up by: Glendell Docker CMA,  October 09, 2010 4:55 PM  Additional Follow-up for Phone Call Additional follow up Details #1::        pt needs to be seen by GYN  Additional Follow-up by: D. Thomos Lemons DO,  October 09, 2010 5:14 PM  New Problems: MENORRHAGIA, POSTMENOPAUSAL (ICD-627.1)   Additional Follow-up for Phone Call Additional follow up Details #2::    call placed to patient at [phone] was informed per Dr Artist Pais instructions and she would get a call from our referral coordinator regarding the   gyn appointment date and time . Patient has verbalized understanding regarding contact Follow-up by: Glendell Docker CMA,  October 09, 2010 5:28 PM  New Problems: MENORRHAGIA, POSTMENOPAUSAL (ICD-627.1)

## 2010-10-22 DIAGNOSIS — G4733 Obstructive sleep apnea (adult) (pediatric): Secondary | ICD-10-CM

## 2010-11-06 ENCOUNTER — Telehealth: Payer: Self-pay | Admitting: Internal Medicine

## 2010-11-06 NOTE — Telephone Encounter (Signed)
Refill- lasix 20mg  tab sano. Take one tablet by mouth one time daily. Qty 30. Last fill 2.29.12

## 2010-11-07 NOTE — Telephone Encounter (Signed)
Ok to refill lasix x 3.

## 2010-11-07 NOTE — Telephone Encounter (Signed)
Last OV - lasix not on med list.  Who is original prescriber?

## 2010-11-07 NOTE — Telephone Encounter (Signed)
Call placed to patient at [phone], she stated  Dr Leanor Kail was the original prescriber and it may have been an oversight, but she is taking the medication. She stated she last had medication refilled on 09/20/10

## 2010-11-08 MED ORDER — FUROSEMIDE 20 MG PO TABS: 20.0000 mg | ORAL_TABLET | Freq: Every day | ORAL | Status: DC

## 2010-11-08 NOTE — Telephone Encounter (Signed)
Call placed to patient at [phone] was informed Rx sent to pharmacy

## 2010-11-11 ENCOUNTER — Encounter: Payer: Self-pay | Admitting: Internal Medicine

## 2010-11-13 ENCOUNTER — Encounter (HOSPITAL_COMMUNITY)
Admission: RE | Admit: 2010-11-13 | Discharge: 2010-11-13 | Disposition: A | Discharge status: Home / Self Care | Payer: Managed Care, Other (non HMO) | Source: Ambulatory Visit | Attending: Obstetrics & Gynecology | Admitting: Obstetrics & Gynecology

## 2010-11-13 LAB — CBC
HCT: 40.7 % (ref 36.0–46.0)
MCH: 20.6 pg — ABNORMAL LOW (ref 26.0–34.0)
MCV: 67.5 fL — ABNORMAL LOW (ref 78.0–100.0)
Platelets: 170 10*3/uL (ref 150–400)
RDW: 16.1 % — ABNORMAL HIGH (ref 11.5–15.5)

## 2010-11-13 LAB — BASIC METABOLIC PANEL
BUN: 10 mg/dL (ref 6–23)
Chloride: 105 mEq/L (ref 96–112)
Creatinine, Ser: 0.8 mg/dL (ref 0.4–1.2)
GFR calc non Af Amer: >60 mL/min (ref >60)
Glucose, Bld: 98 mg/dL (ref 70–99)

## 2010-11-15 ENCOUNTER — Ambulatory Visit (INDEPENDENT_AMBULATORY_CARE_PROVIDER_SITE_OTHER): Payer: Managed Care, Other (non HMO) | Admitting: Internal Medicine

## 2010-11-15 ENCOUNTER — Telehealth: Payer: Self-pay | Admitting: Family Medicine

## 2010-11-15 ENCOUNTER — Telehealth: Payer: Self-pay | Admitting: Internal Medicine

## 2010-11-15 DIAGNOSIS — N950 Postmenopausal bleeding: Secondary | ICD-10-CM

## 2010-11-15 DIAGNOSIS — I10 Essential (primary) hypertension: Secondary | ICD-10-CM

## 2010-11-15 DIAGNOSIS — M1990 Unspecified osteoarthritis, unspecified site: Secondary | ICD-10-CM

## 2010-11-15 DIAGNOSIS — G4733 Obstructive sleep apnea (adult) (pediatric): Secondary | ICD-10-CM

## 2010-11-15 DIAGNOSIS — R7309 Other abnormal glucose: Secondary | ICD-10-CM

## 2010-11-15 HISTORY — DX: Unspecified osteoarthritis, unspecified site: M19.90

## 2010-11-15 HISTORY — DX: Obstructive sleep apnea (adult) (pediatric): G47.33

## 2010-11-15 MED ORDER — GABAPENTIN 300 MG PO CAPS: 300.0000 mg | ORAL_CAPSULE | Freq: Three times a day (TID) | ORAL | Status: DC

## 2010-11-15 MED ORDER — NEBIVOLOL HCL 10 MG PO TABS: 10.0000 mg | ORAL_TABLET | Freq: Every day | ORAL | Status: DC

## 2010-11-15 MED ORDER — NABUMETONE 500 MG PO TABS: 500.0000 mg | ORAL_TABLET | Freq: Two times a day (BID) | ORAL | Status: DC | PRN

## 2010-11-15 MED ORDER — PANTOPRAZOLE SODIUM 40 MG PO TBEC: 40.0000 mg | DELAYED_RELEASE_TABLET | Freq: Every day | ORAL | Status: DC

## 2010-11-15 NOTE — Telephone Encounter (Signed)
Orders have been entered for [facility]

## 2010-11-15 NOTE — Patient Instructions (Signed)
Stop taking meloxicam Use nabumetone as needed and sparingly for osteoarthritis Please complete the following lab tests before your next follow up appointment: BMET 401.9 HgA1c - 790.29

## 2010-11-15 NOTE — Assessment & Plan Note (Signed)
The sleep study completed in 10/18/2010 Epworth score was 16 Sleep study results: Mild obstructive sleep apnea/hypopnea syndrome with AHI of 12 events per hour and O2 saturation as low as 84%.  Pt advised to continue weight loss efforts.  Goal wt loss 20-30 pounds.

## 2010-11-15 NOTE — Telephone Encounter (Signed)
Please fax lab order to [facility] for week of 8-13 for bmet 401.9 and hbga1c 790.29

## 2010-11-15 NOTE — Telephone Encounter (Signed)
Pt would like the results of the sleep study test mailed to her.

## 2010-11-15 NOTE — Assessment & Plan Note (Signed)
Patient advised to use generic Relafen sparingly and as needed Continue protonic for gastric protection Monitor creatinine Lab Results  Component Value Date   CREATININE 0.80 11/13/2010

## 2010-11-15 NOTE — Telephone Encounter (Signed)
Copy of results mailed to patients address on file 

## 2010-11-15 NOTE — Assessment & Plan Note (Signed)
Blood pressure controlled significantly improved on new medication regimen Cr. Stable BP: 132/90 mmHg

## 2010-11-15 NOTE — Assessment & Plan Note (Signed)
Patient was seen by her gynecologist. Pelvic ultrasound showed uterine polyps. Patient is scheduled for hysteroscopy and D&C.

## 2010-11-15 NOTE — Progress Notes (Signed)
Subjective:    Patient ID: Katelyn Fisher, female    DOB: 1951-01-10, 60 y.o.   MRN: [id number]  Hypertension This is a chronic problem. The problem has been gradually improving since onset. The problem is controlled. There are no associated agents to hypertension. Past treatments include beta blockers, ACE inhibitors and calcium channel blockers. The current treatment provides mild improvement. There are no compliance problems.   Patient tolerating bystolic well.  OSA - sleep study reviewed.  Review of Systems   negative for chest pain,  No change in weight  Past Medical History  Diagnosis Date  . Asthma   . Hypertension   . Obesity   . Hx of colonic polyps   . GERD (gastroesophageal reflux disease)   . History of colonic diverticulitis     History   Social History  . Marital Status: Married    Spouse Name: N/A    Number of Children: N/A  . Years of Education: N/A   Occupational History  . Not on file.   Social History Main Topics  . Smoking status: Never Smoker   . Smokeless tobacco: Not on file  . Alcohol Use: Not on file  . Drug Use: Not on file  . Sexually Active: Not on file   Other Topics Concern  . Not on file   Social History Narrative   Married- 40 yearsNever SmokedAlcohol use-noDrug use-noDaily Caffeine UseOccupation: housewifeCaffeine use/day:  NoneDoes Patient Exercise:  yes    Past Surgical History  Procedure Date  . Cholecystectomy 1991  . Dilation and curettage of uterus   . Tubal ligation   . Appendectomy     Family History  Problem Relation Age of Onset  . Heart disease Maternal Aunt     x 3 -2 brothers  . Hypertension Maternal Aunt   . Cancer Maternal Aunt     2 maternal aunts  . Prostate cancer Father   . Hyperlipidemia Brother   . Hyperlipidemia Sister   . Other      no FH colon cancer  . Diabetes Mother   . Diabetes Father     Allergies  Allergen Reactions  . Iodine     Rash  . Penicillins     REACTION: unspecified      Current Outpatient Prescriptions on File Prior to Visit  Medication Sig Dispense Refill  . albuterol (PROAIR HFA) 108 (90 BASE) MCG/ACT inhaler Inhale 2 puffs into the lungs 2 (two) times daily as needed.        Marland Kitchen amLODipine-benazepril (LOTREL) 10-40 MG per capsule Take 1 capsule by mouth daily.        Marland Kitchen aspirin 81 MG tablet Take 81 mg by mouth daily.        Marland Kitchen b complex vitamins capsule Take 1 capsule by mouth daily.        . budesonide-formoterol (SYMBICORT) 160-4.5 MCG/ACT inhaler Inhale 2 puffs into the lungs 2 (two) times daily.        Marland Kitchen buPROPion (WELLBUTRIN XL) 150 MG 24 hr tablet Take 150 mg by mouth daily.        . furosemide (LASIX) 20 MG tablet Take 1 tablet (20 mg total) by mouth daily.  30 tablet  3  . gabapentin (NEURONTIN) 300 MG capsule Take 300 mg by mouth 3 (three) times daily.        . pentoxifylline (TRENTAL) 400 MG CR tablet Take 400 mg by mouth 3 (three) times daily with meals.        Marland Kitchen  terbinafine (LAMISIL) 250 MG tablet Take 250 mg by mouth daily.        . traMADol (ULTRAM) 50 MG tablet Take 50 mg by mouth every 6 (six) hours as needed.        . traZODone (DESYREL) 50 MG tablet Take 50 mg by mouth. 1-4 tablets by mouth at bedtime       . triamcinolone (KENALOG) 0.1 % cream Apply 1 application topically. Apply topically to affected area once daily       . Wheat Dextrin (BENEFIBER) TABS Take by mouth daily.        Marland Kitchen DISCONTD: meloxicam (MOBIC) 15 MG tablet Take 15 mg by mouth daily.        Marland Kitchen DISCONTD: nabumetone (RELAFEN) 500 MG tablet Take 500 mg by mouth 2 (two) times daily.        Marland Kitchen DISCONTD: nebivolol (BYSTOLIC) 10 MG tablet Take 10 mg by mouth daily.        Marland Kitchen DISCONTD: pantoprazole (PROTONIX) 40 MG tablet Take 40 mg by mouth daily.          BP 132/90  Pulse 78  Temp(Src) 97.8 F (36.6 C) (Oral)  Resp 18  Ht 5\' 2"  (1.575 m)  Wt 247 lb (112.038 kg)  BMI 45.18 kg/m2  SpO2 100%     Objective:   Physical Exam  Constitutional: She appears well-developed  and well-nourished.  Cardiovascular: Normal rate, regular rhythm and normal heart sounds.   Pulmonary/Chest: Effort normal and breath sounds normal. She has no wheezes. She has no rales.  Skin: Skin is warm and dry.  Psychiatric: She has a normal mood and affect. Her behavior is normal.    Assessment & Plan:

## 2010-11-17 ENCOUNTER — Other Ambulatory Visit: Payer: Self-pay | Admitting: Obstetrics & Gynecology

## 2010-11-17 ENCOUNTER — Ambulatory Visit (HOSPITAL_COMMUNITY)
Admission: RE | Admit: 2010-11-17 | Discharge: 2010-11-17 | Disposition: A | Discharge status: Home / Self Care | Payer: Managed Care, Other (non HMO) | Source: Ambulatory Visit | Attending: Obstetrics & Gynecology | Admitting: Obstetrics & Gynecology

## 2010-11-17 DIAGNOSIS — N950 Postmenopausal bleeding: Secondary | ICD-10-CM | POA: Insufficient documentation

## 2010-11-17 DIAGNOSIS — Z01812 Encounter for preprocedural laboratory examination: Secondary | ICD-10-CM | POA: Insufficient documentation

## 2010-11-17 DIAGNOSIS — N840 Polyp of corpus uteri: Secondary | ICD-10-CM | POA: Insufficient documentation

## 2010-11-17 DIAGNOSIS — Z01818 Encounter for other preprocedural examination: Secondary | ICD-10-CM | POA: Insufficient documentation

## 2010-11-17 DIAGNOSIS — D250 Submucous leiomyoma of uterus: Secondary | ICD-10-CM | POA: Insufficient documentation

## 2010-11-17 LAB — PREGNANCY, URINE: Preg Test, Ur: NEGATIVE

## 2010-11-20 NOTE — Telephone Encounter (Signed)
Was this done?

## 2010-12-08 NOTE — Assessment & Plan Note (Signed)
Katelyn Fisher                        [facility] OFFICE NOTE   NAME:Katelyn Fisher                    MRN:          [id number]  DATE:09/02/2006                            DOB:          [birth year]    Katelyn Fisher presents today for follow up.  She was seen at the emergency  department on Friday after having a syncopal episode.  Katelyn Fisher has  seen me on August 28, 2006 with a significant upper respiratory  infection influenza type.  She reports that she woke up in the middle of  the night 2 days prior to being seen at the emergency department.  She  felt kind of hot in the bathroom and the next thing she knew she was  picking herself up from the floor.  She has sustained a abrasion to the  left side of her head.  She reports that she did not go to the emergency  department immediately.  A day and a half later she decided to go to the  ER after her congestion and fever continued.  She was since evaluated  extensively in the emergency department including comprehensive  metabolic profile, CBC which were unremarkable.  She also had a CT of  the head done which was normal.  She also had a chest x-ray which was  unremarkable.  She was treated with Prednisone and a nebulizer treatment  with a significant improvement of symptoms.  She was discharged with an  Albuterol inhaler and a Prednisone taper over 5 days.  The patient  presents today reporting that she feels much better compared to when she  first presented.  She denies any shortness of breath or dyspnea  exertion.  The cough has also improved.   MEDICATIONS:  Please see med list.   OBJECTIVE:  Temperature 99.6, weight 231, blood pressure 122/82.  GENERAL:  A pleasant female in no acute distress.  Answers questions  appropriately.  HEENT:  Mucosa slightly clearness discharge, oropharynx is benign.  LUNGS:  Clear with good air movement.  HEART:  Regular rate and rhythm.  A chest x-ray  preformed on August 28, 2006 was interpreted by radiology  and there was no acute cardiac, pulmonary disease noted.   Reviewed the medical record from the ER.   IMPRESSION:  1. Influenza type illness.  The patient is improving.  2. Asthma exacerbation.   PLAN:  Advise patient to continue the Prednisone provided at the  emergency department as well as using her Albuterol inhaler as needed.  She is to follow up with me on Thursday or sooner if her symptoms  worsen.     Leanne Chang, M.D.  Electronically Signed   LA/MedQ  DD: 09/02/2006  DT: 09/03/2006  Job #: [id number]

## 2010-12-08 NOTE — Procedures (Signed)
[facility]  Patient:    Katelyn Fisher, Katelyn Fisher                      MRN: [id number] Proc. Date: 03/03/01 Attending:  Verlin Grills, M.D. CC:         Modesta Messing, M.D.   Procedure Report  PROCEDURE:  Colonoscopy.  REFERRING PHYSICIAN:  Modesta Messing, M.D.  INDICATION FOR PROCEDURE:  Katelyn Fisher (DOB:  09-Apr-1951) is a 60 year old female with iron deficiency anemia. [name] continues to have regular menses. She is referred for surveillance colonoscopy with polypectomy to prevent colon cancer.  ENDOSCOPIST:  Verlin Grills, M.D.  PREMEDICATION:  Versed 5 mg, Demerol 50 mg.  ENDOSCOPE:  Olympus pediatric colonoscope.  DESCRIPTION OF PROCEDURE:  After obtaining informed consent, the patient was placed in the left lateral decubitus position. I administered intravenous Demerol and intravenous Versed to achieve conscious sedation for the procedure. The patients cardiac rhythm, oxygen saturation and blood pressure were monitored throughout the procedure and documented in the medical record.  Anal inspection was normal. Digital rectal exam was normal. The Olympus pediatric video colonoscope was introduced into the rectum and easily advanced to the cecum. Colonic preparation for the exam today was excellent.  RECTUM:  Normal.  SIGMOID COLON/DESCENDING COLON:  Normal.  SPLENIC FLEXURE:  Normal.  TRANSVERSE COLON:  Normal.  HEPATIC FLEXURE:  Normal.  ASCENDING COLON:  Normal.  CECUM/ILEOCECAL VALVE:  Normal.  ASSESSMENT:  Normal proctocolonoscopy to the cecum. No endoscopic evidence for the presence of colorectal neoplasia. DD:  03/03/01 TD:  03/03/01 Job: [id number]

## 2010-12-08 NOTE — Op Note (Signed)
   NAME:  Katelyn Fisher, Katelyn Fisher                       ACCOUNT NO.:  [id number]   MEDICAL RECORD NO.:  [id number]                   PATIENT TYPE:  AMB   LOCATION:  SDC                                  FACILITY:  [facility]   PHYSICIAN:  Ronda Fairly. Galen Daft, M.D.              DATE OF BIRTH:  20-Jan-1951   DATE OF PROCEDURE:  DATE OF DISCHARGE:                                 OPERATIVE REPORT   PREOPERATIVE DIAGNOSIS:  Perimenopausal bleeding.   POSTOPERATIVE DIAGNOSES:  1. Perimenopausal bleeding.  2. Submucosal fibroid.   SURGEON:  Ronda Fairly. Galen Daft, M.D.   ANESTHESIA:  General.   COMPLICATIONS:  None.   ESTIMATED BLOOD LOSS:  Minimal.   SPECIMENS:  Biopsies of fibroid and uterine curettage.   PROCEDURE:  The patient was identified as Statistician.  She was brought  to the operating room.  Time out was performed, general anesthesia,  Hibiclens prep, and the cervix was dilated to accept the hysteroscope.  The  hysteroscope revealed evidence of some uterine polyps and a submucosal  fibroid on the patient's left side.  Otherwise unremarkable.  These polyps  were removed with sharp curette and the polyp forceps and these were sent  with the curettings in a separate specimen.  Next biopsies were performed of  remaining polyp areas and of the uterine fibroid.  There were no  complications from the procedure.  It was tolerated well.  Fluid deficit was  under 300 mL.  The patient left the operating room in stable condition.  All  instrument, sponge, and needle counts were correct throughout the case.                                               Ronda Fairly. Galen Daft, M.D.    N[place]T/MEDQ  D:  01/27/2003  T:  01/27/2003  Job:  [id number]

## 2010-12-08 NOTE — Assessment & Plan Note (Signed)
[facility]                        [place] OFFICE NOTE   NAME:Katelyn Fisher, Katelyn Fisher                    MRN:          [id number]  DATE:09/05/2006                            DOB:          03-24-1951    REASON FOR VISIT:  Followup flu.   SUBJECTIVE:  Ms. Meche reports that she feels much better.  She has  not had any fevers.  Intermittent cough.  She reports that she is pretty  close to baseline.  She has no specific complaints today.  She does  report that she needs a refill on her albuterol inhaler.   MEDICATIONS:  Please see med list.   ALLERGIES:  PENICILLIN.   OBJECTIVE:  VITAL SIGNS:  Weight 236, pulse 97.7, blood pressure 126/80.  GENERAL:  We have a pleasant female in no acute distress who answers  questions appropriately.  HEENT:  Nasal mucosa was clear.  Tympanic membranes were both clear  bilaterally.  Oropharynx was unremarkable.  NECK:  Supple.  No lymphadenopathy, carotid bruits or CBD.  LUNGS:  Clear to auscultation.  HEART:  Regular rate and rhythm.   IMPRESSION:  1. Influenza-type illness, resolved.  2. Asthma/bronchitis, improved.   PLAN:  1. Continue current therapy with p.r.n. use of the albuterol.  2. I did refill her Tussionex one teaspoon q.h.s. p.r.n. six ounces      and no refills.  3. The patient to follow up as scheduled next week for routine      followup on blood pressure.     Leanne Chang, M.D.  Electronically Signed    LA/MedQ  DD: 09/05/2006  DT: 09/05/2006  Job #: [id number]

## 2010-12-19 ENCOUNTER — Encounter: Payer: Self-pay | Admitting: Internal Medicine

## 2010-12-20 NOTE — Op Note (Signed)
  Katelyn Fisher, Katelyn Fisher             ACCOUNT NO.:  [id number]  MEDICAL RECORD NO.:  [id number]           PATIENT TYPE:  O  LOCATION:  [facility]                          FACILITY[facility]  PHYSICIAN:  Genia Del, M.D.DATE OF BIRTH:  11/18/1950  DATE OF PROCEDURE:  11/17/2010 DATE OF DISCHARGE:                              OPERATIVE REPORT   PREOPERATIVE DIAGNOSES: 1. Postmenopausal bleeding with intrauterine lesion. 2. Atypical glandular epithelium. 3. Polyps on endometrial biopsy.  POSTOPERATIVE DIAGNOSES: 1. Postmenopausal bleeding with intrauterine lesion. 2. Atypical glandular epithelium. 3. Polyps on endometrial biopsy. 4. Endometrial polyps and submucosal myoma.  PROCEDURES:  Hysteroscopy with resection and dilatation and curettage.  SURGEON:  Genia Del, MD  ASSISTANTS:  None.  ANESTHESIOLOGIST:  Belva Agee, MD  DESCRIPTION OF PROCEDURE:  Under general anesthesia with endotracheal intubation, the patient was in lithotomy position.  She was prepped with SurgiPrep on the suprapubic vulvar and vaginal areas and draped as usual.  The bladder was emptied with a catheter.  The vaginal exam under general anesthesia revealed an anteverted uterus, normal volume, mobile, no adnexal mass.  We inserted the speculum in the vagina.  The anterior lip of the cervix was grasped with a tenaculum.  We proceeded with a paracervical block with Nesacaine 1% 20 mL total at 4 and 8 o'clock.  We then dilated the cervix with Hegar dilators up to #31 without difficulty.  We started with a diagnostic hysteroscopy which confirms endometrial polyps, areas of thickening, and submucosal myoma.  We took pictures.  Both ostia are well visualized.  We then removed the diagnostic hysteroscope and inserted an operative hysteroscope with a single loop.  We resected all polyps and areas of thickening as well as the submucosal portion of uterine myomas.  We completed hemostasis with the single  loop in coag mode.  Pictures are taken after resection.  We also proceeded with a systematic endometrial curettage with a sharp curette on all surfaces.  The specimens were sent to Pathology separately.  The hysteroscope was inserted again in the intrauterine cavity and hemostasis was confirmed.  We removed all instruments.  The estimated blood loss was 50 mL, fluid deficit was 175 mL.  The patient received Flagyl IV before induction.  No complications occurred and the patient was brought to recovery room in good stable status.     Genia Del, M.D.     ML/MEDQ  D:  11/17/2010  T:  11/18/2010  Job:  [id number]  Electronically Signed by Genia Del M.D. on 12/20/2010 05:04:02 PM

## 2011-01-25 ENCOUNTER — Ambulatory Visit (INDEPENDENT_AMBULATORY_CARE_PROVIDER_SITE_OTHER): Payer: Managed Care, Other (non HMO) | Admitting: Internal Medicine

## 2011-01-25 ENCOUNTER — Encounter: Payer: Self-pay | Admitting: Internal Medicine

## 2011-01-25 DIAGNOSIS — M79606 Pain in leg, unspecified: Secondary | ICD-10-CM

## 2011-01-25 DIAGNOSIS — M79609 Pain in unspecified limb: Secondary | ICD-10-CM

## 2011-01-25 MED ORDER — LEVOFLOXACIN 500 MG PO TABS: 500.0000 mg | ORAL_TABLET | Freq: Every day | ORAL | Status: AC

## 2011-01-27 DIAGNOSIS — M25561 Pain in right knee: Secondary | ICD-10-CM | POA: Insufficient documentation

## 2011-01-27 HISTORY — DX: Pain in right knee: M25.561

## 2011-01-27 NOTE — Assessment & Plan Note (Signed)
Concern over possible cellulitis. Begin abx tx. Followup closely if no improvement or worsening.

## 2011-01-27 NOTE — Progress Notes (Signed)
  Subjective:    Patient ID: Katelyn Fisher, female    DOB: [birth year], 60 y.o.   MRN: [id number]  HPI Pt presents to clinic for evaluation of leg swelling and warmth. Has chronic intermittent h/o right ant lower leg swelling and erythema. Follows with dermatology with h/o ?liposclerosis. States receives injections periodically. Was unable to be seen by dermatology for several weeks. Has had negative lower extremity us's that fail to demonstrate dvt. Notes one week h/o swelling and mild discomfort in the same area as past occurrences. No f/c, wound or drainage. No exacerbating or alleviating factors. No other complaints.  Reviewed pmh, medication and allergies    Review of Systems  Constitutional: Negative for fever and chills.  Skin: Positive for color change. Negative for pallor, rash and wound.  Hematological: Does not bruise/bleed easily.       Objective:   Physical Exam  Nursing note and vitals reviewed. Constitutional: She appears well-developed and well-nourished. No distress.  HENT:  Head: Normocephalic and atraumatic.  Neurological: She is alert.  Skin: Skin is warm and dry. No rash noted. She is not diaphoretic. There is erythema. No pallor.       Right anterior LE: mild anterior erythema with associated warmth and minimal st swelling. No wound/ulceration. No calf swelling/tenderness or palpable cords.  Psychiatric: She has a normal mood and affect.          Assessment & Plan:

## 2011-03-06 LAB — BASIC METABOLIC PANEL
Calcium: 9.5 mg/dL (ref 8.4–10.5)
Creat: 0.8 mg/dL (ref 0.50–1.10)
Glucose, Bld: 88 mg/dL (ref 70–99)
Sodium: 141 mEq/L (ref 135–145)

## 2011-03-06 LAB — HEMOGLOBIN A1C: Hgb A1c MFr Bld: 6.8 % — ABNORMAL HIGH (ref <5.7)

## 2011-03-12 ENCOUNTER — Ambulatory Visit: Payer: Managed Care, Other (non HMO) | Admitting: Internal Medicine

## 2011-03-12 ENCOUNTER — Encounter: Payer: Self-pay | Admitting: Internal Medicine

## 2011-03-12 ENCOUNTER — Ambulatory Visit (INDEPENDENT_AMBULATORY_CARE_PROVIDER_SITE_OTHER): Payer: Managed Care, Other (non HMO) | Admitting: Internal Medicine

## 2011-03-12 DIAGNOSIS — E1169 Type 2 diabetes mellitus with other specified complication: Secondary | ICD-10-CM | POA: Insufficient documentation

## 2011-03-12 DIAGNOSIS — E119 Type 2 diabetes mellitus without complications: Secondary | ICD-10-CM

## 2011-03-12 DIAGNOSIS — E669 Obesity, unspecified: Secondary | ICD-10-CM | POA: Insufficient documentation

## 2011-03-12 DIAGNOSIS — I10 Essential (primary) hypertension: Secondary | ICD-10-CM

## 2011-03-12 HISTORY — DX: Type 2 diabetes mellitus with other specified complication: E66.9

## 2011-03-12 NOTE — Assessment & Plan Note (Signed)
Normotensive and stable. Continue current regimen. Monitor bp as outpt and followup in clinic as scheduled.  

## 2011-03-12 NOTE — Patient Instructions (Signed)
Please schedule cbc, chem7, a1c, urine microalbumin, lipid (250.0) and lft (v58.69) prior to next visit

## 2011-03-12 NOTE — Progress Notes (Signed)
  Subjective:    Patient ID: Katelyn Fisher, female    DOB: 09-17-1950, 60 y.o.   MRN: [id number]  HPI Pt presents to clinic for followup of multiple medical problems. Pt with h/o hyperglycemia now with A1c increased to 6.8. No associated sx's. Last seen with inflammatory changes of right lower leg treated with abx tx. States sx's resolved with abx and now back under care of dermatology. BP reviewed as nl with h/o htn. Wt stable. No other complaint.   Past Medical History  Diagnosis Date  . Asthma   . Hypertension   . Obesity   . Hx of colonic polyps   . GERD (gastroesophageal reflux disease)   . History of colonic diverticulitis    Past Surgical History  Procedure Date  . Cholecystectomy 1991  . Dilation and curettage of uterus   . Tubal ligation   . Appendectomy     reports that she has never smoked. She does not have any smokeless tobacco history on file. Her alcohol and drug histories not on file. family history includes Cancer in her maternal aunt; Diabetes in her father and mother; Heart disease in her maternal aunt; Hyperlipidemia in her brother and sister; Hypertension in her maternal aunt; Other in an unspecified family member; and Prostate cancer in her father. Allergies  Allergen Reactions  . Iodine     Rash  . Penicillins     REACTION: unspecified     Review of Systems see hpi     Objective:   Physical Exam  Physical Exam  Nursing note and vitals reviewed. Constitutional: Appears well-developed and well-nourished. No distress.  HENT:  Head: Normocephalic and atraumatic.  Right Ear: External ear normal.  Left Ear: External ear normal.  Eyes: Conjunctivae are normal. No scleral icterus.  Neck: Neck supple. Carotid bruit is not present.  Cardiovascular: Normal rate, regular rhythm and normal heart sounds.  Exam reveals no gallop and no friction rub.   No murmur heard. Pulmonary/Chest: Effort normal and breath sounds normal. No respiratory distress. He has no  wheezes. no rales.  Lymphadenopathy:    He has no cervical adenopathy.  Neurological:Alert.  Skin: Skin is warm and dry. Not diaphoretic.  Psychiatric: Has a normal mood and affect.        Assessment & Plan:

## 2011-03-12 NOTE — Assessment & Plan Note (Signed)
New dx. Recommend low sugar/carb diet, exercise as tolerated and wt loss. Hold on medication. Reevaluate next visit and if glycemic control not at goal then begin tx.

## 2011-03-19 ENCOUNTER — Telehealth: Payer: Self-pay | Admitting: Internal Medicine

## 2011-03-19 MED ORDER — FUROSEMIDE 20 MG PO TABS: 20.0000 mg | ORAL_TABLET | Freq: Every day | ORAL | Status: DC

## 2011-03-19 NOTE — Telephone Encounter (Signed)
Rx refill sent to pharmacy. 

## 2011-03-19 NOTE — Telephone Encounter (Signed)
Refill- furosemide 20mg  tab ranb. Take one tablet by mouth one time daily. Qty 30. Last fill 7.26.12

## 2011-03-22 ENCOUNTER — Encounter: Payer: Self-pay | Admitting: Internal Medicine

## 2011-03-22 ENCOUNTER — Ambulatory Visit (INDEPENDENT_AMBULATORY_CARE_PROVIDER_SITE_OTHER): Payer: Managed Care, Other (non HMO) | Admitting: Internal Medicine

## 2011-03-22 VITALS — BP 132/90 | HR 81 | Temp 98.2°F | Resp 16 | Wt 246.0 lb

## 2011-03-22 DIAGNOSIS — J40 Bronchitis, not specified as acute or chronic: Secondary | ICD-10-CM | POA: Insufficient documentation

## 2011-03-22 MED ORDER — CHLORPHENIRAMINE-HYDROCODONE 8-10 MG/5ML PO LQCR: 5.0000 mL | Freq: Two times a day (BID) | ORAL | Status: DC | PRN

## 2011-03-22 MED ORDER — AZITHROMYCIN 250 MG PO TABS: ORAL_TABLET | ORAL | Status: AC

## 2011-03-22 NOTE — Progress Notes (Signed)
  Subjective:     Katelyn Fisher is a 60 y.o. female who presents for evaluation of symptoms of a URI. Symptoms include cough described as productive. Onset of symptoms was 3 days ago, and has been unchanged since that time. Treatment to date: none.  The following portions of the patient's history were reviewed and updated as appropriate: allergies, current medications, past family history, past medical history, past social history, past surgical history and problem list.  Review of Systems + chills, cough, nasal congestion, wheezing. No fever, hemoptyis.  Objective:    BP 132/90  Pulse 81  Temp(Src) 98.2 F (36.8 C) (Oral)  Resp 16  Wt 246 lb (111.585 kg)  SpO2 99%  General Appearance:    Alert, cooperative, no distress, appears stated age  Head:    Normocephalic, without obvious abnormality, atraumatic  Eyes:    PERRL, conjunctiva/corneas clear, EOM's intact, fundi    benign, both eyes  Ears:    Normal TM's and external ear canals, both ears  Nose:   Nares normal, septum midline, mucosa normal, no drainage    or sinus tenderness  Throat:   Lips, mucosa, and tongue normal; teeth and gums normal  Neck:   Supple, symmetrical, trachea midline, no adenopathy;    thyroid:  no enlargement/tenderness/nodules; no carotid   bruit or JVD     Lungs:     Clear to auscultation bilaterally, respirations unlabored      Heart:    Regular rate and rhythm, S1 and S2 normal, no murmur, rub   or gallop              Extremities:   Extremities normal, atraumatic, no cyanosis or edema     Skin:   Skin color, texture, turgor normal, no rashes or lesions           Assessment:    bronchitis  Plan:   Begin abx tx. Attempt tussionex prn. Cautioned re: possible sedating effecct

## 2011-03-23 ENCOUNTER — Other Ambulatory Visit: Payer: Self-pay | Admitting: Internal Medicine

## 2011-03-23 ENCOUNTER — Ambulatory Visit: Payer: Managed Care, Other (non HMO) | Admitting: Internal Medicine

## 2011-03-23 DIAGNOSIS — Z79899 Other long term (current) drug therapy: Secondary | ICD-10-CM

## 2011-04-20 ENCOUNTER — Telehealth: Payer: Self-pay | Admitting: Internal Medicine

## 2011-04-20 MED ORDER — AMLODIPINE BESY-BENAZEPRIL HCL 10-40 MG PO CAPS: 1.0000 | ORAL_CAPSULE | Freq: Every day | ORAL | Status: DC

## 2011-04-20 NOTE — Telephone Encounter (Signed)
Rx refill sent to pharmacy. 

## 2011-04-20 NOTE — Telephone Encounter (Signed)
Patient states that she is out of amlodipine benazepril. Please send refill to [facility] at [facility] [address].

## 2011-06-04 ENCOUNTER — Other Ambulatory Visit: Payer: Self-pay | Admitting: *Deleted

## 2011-06-04 DIAGNOSIS — Z79899 Other long term (current) drug therapy: Secondary | ICD-10-CM

## 2011-06-04 LAB — LIPID PANEL
Cholesterol: 200 mg/dL (ref 0–200)
LDL Cholesterol: 129 mg/dL — ABNORMAL HIGH (ref 0–99)
Total CHOL/HDL Ratio: 3.8 Ratio
VLDL: 18 mg/dL (ref 0–40)

## 2011-06-04 LAB — HEPATIC FUNCTION PANEL
ALT: 20 U/L (ref 0–35)
Bilirubin, Direct: 0.1 mg/dL (ref 0.0–0.3)
Indirect Bilirubin: 0.3 mg/dL (ref 0.0–0.9)
Total Bilirubin: 0.4 mg/dL (ref 0.3–1.2)

## 2011-06-04 LAB — BASIC METABOLIC PANEL
Chloride: 105 mEq/L (ref 96–112)
Creat: 0.78 mg/dL (ref 0.50–1.10)

## 2011-06-05 LAB — CBC
MCHC: 30.4 g/dL (ref 30.0–36.0)
MCV: 68.2 fL — ABNORMAL LOW (ref 78.0–100.0)
Platelets: 210 10*3/uL (ref 150–400)
RDW: 16.8 % — ABNORMAL HIGH (ref 11.5–15.5)
WBC: 4.2 10*3/uL (ref 4.0–10.5)

## 2011-06-05 LAB — MICROALBUMIN / CREATININE URINE RATIO: Microalb, Ur: 0.5 mg/dL (ref 0.00–1.89)

## 2011-06-05 LAB — HEMOGLOBIN A1C
Hgb A1c MFr Bld: 6.3 % — ABNORMAL HIGH (ref <5.7)
Mean Plasma Glucose: 134 mg/dL — ABNORMAL HIGH (ref <117)

## 2011-06-11 ENCOUNTER — Ambulatory Visit (INDEPENDENT_AMBULATORY_CARE_PROVIDER_SITE_OTHER): Payer: Managed Care, Other (non HMO) | Admitting: Internal Medicine

## 2011-06-11 ENCOUNTER — Encounter: Payer: Self-pay | Admitting: Internal Medicine

## 2011-06-11 ENCOUNTER — Telehealth: Payer: Self-pay | Admitting: Internal Medicine

## 2011-06-11 VITALS — BP 132/80 | HR 68 | Temp 97.8°F | Resp 18 | Ht 62.0 in | Wt 249.0 lb

## 2011-06-11 DIAGNOSIS — Z23 Encounter for immunization: Secondary | ICD-10-CM

## 2011-06-11 DIAGNOSIS — I10 Essential (primary) hypertension: Secondary | ICD-10-CM

## 2011-06-11 DIAGNOSIS — E119 Type 2 diabetes mellitus without complications: Secondary | ICD-10-CM

## 2011-06-11 DIAGNOSIS — E785 Hyperlipidemia, unspecified: Secondary | ICD-10-CM

## 2011-06-11 MED ORDER — ATORVASTATIN CALCIUM 10 MG PO TABS: 10.0000 mg | ORAL_TABLET | Freq: Every day | ORAL | Status: DC

## 2011-06-11 NOTE — Progress Notes (Signed)
  Subjective:    Patient ID: Katelyn Fisher, female    DOB: 04/09/1951, 60 y.o.   MRN: [id number]  HPI Pt presents to clinic for followup of multiple medical problems. Reviewed a1c improved 6.3 down from 6.8. H/o chronic back pain improved with epidural injxns. LDL elevated and not currently on medication. No other complaints.  Past Medical History  Diagnosis Date  . Asthma   . Hypertension   . Obesity   . Hx of colonic polyps   . GERD (gastroesophageal reflux disease)   . History of colonic diverticulitis    Past Surgical History  Procedure Date  . Cholecystectomy 1991  . Dilation and curettage of uterus   . Tubal ligation   . Appendectomy     reports that she has never smoked. She has never used smokeless tobacco. She reports that she does not drink alcohol or use illicit drugs. family history includes Cancer in her maternal aunt; Diabetes in her father and mother; Heart disease in her maternal aunt; Hyperlipidemia in her brother and sister; Hypertension in her maternal aunt; Other in an unspecified family member; and Prostate cancer in her father. Allergies  Allergen Reactions  . Iodine     Rash  . Penicillins     REACTION: unspecified     Review of Systems see hpi     Objective:   Physical Exam  Physical Exam  Nursing note and vitals reviewed. Constitutional: Appears well-developed and well-nourished. No distress.  HENT:  Head: Normocephalic and atraumatic.  Right Ear: External ear normal.  Left Ear: External ear normal.  Eyes: Conjunctivae are normal. No scleral icterus.  Neck: Neck supple. Carotid bruit is not present.  Cardiovascular: Normal rate, regular rhythm and normal heart sounds.  Exam reveals no gallop and no friction rub.   No murmur heard. Pulmonary/Chest: Effort normal and breath sounds normal. No respiratory distress. He has no wheezes. no rales.  Lymphadenopathy:    He has no cervical adenopathy.  Neurological:Alert.  Skin: Skin is warm and  dry. Not diaphoretic.  Psychiatric: Has a normal mood and affect.        Assessment & Plan:

## 2011-06-11 NOTE — Patient Instructions (Signed)
Please schedule chem7, a1c (250.0) and lipid/lft (272.4) prior to next visit 

## 2011-06-12 NOTE — Telephone Encounter (Signed)
Future orders placed and copy sent to lab.

## 2011-06-15 DIAGNOSIS — E785 Hyperlipidemia, unspecified: Secondary | ICD-10-CM | POA: Insufficient documentation

## 2011-06-15 NOTE — Assessment & Plan Note (Signed)
Improved control. Low sugar/carb diet, exercise and wt loss

## 2011-06-15 NOTE — Assessment & Plan Note (Signed)
Normotensive and stable. Continue current regimen. Monitor bp as outpt and followup in clinic as scheduled.  

## 2011-06-15 NOTE — Assessment & Plan Note (Signed)
Begin lipitor 10mg  qd. Obtain lipid/lft prior to next visit

## 2011-07-09 ENCOUNTER — Other Ambulatory Visit: Payer: Self-pay | Admitting: Internal Medicine

## 2011-07-09 DIAGNOSIS — Z1231 Encounter for screening mammogram for malignant neoplasm of breast: Secondary | ICD-10-CM

## 2011-08-02 LAB — HEPATIC FUNCTION PANEL
ALT: 22 U/L (ref 0–35)
Alkaline Phosphatase: 111 U/L (ref 39–117)
Bilirubin, Direct: 0.1 mg/dL (ref 0.0–0.3)
Indirect Bilirubin: 0.2 mg/dL (ref 0.0–0.9)
Total Protein: 6.7 g/dL (ref 6.0–8.3)

## 2011-08-02 NOTE — Telephone Encounter (Signed)
Addended by: Mervin Kung A on: 08/02/2011 09:27 AM   Modules accepted: Orders

## 2011-08-02 NOTE — Telephone Encounter (Signed)
Future lab order released and forwarded to the lab. 

## 2011-08-03 LAB — LIPID PANEL
LDL Cholesterol: 76 mg/dL (ref 0–99)
Triglycerides: 56 mg/dL (ref <150)
VLDL: 11 mg/dL (ref 0–40)

## 2011-08-03 LAB — BASIC METABOLIC PANEL
BUN: 11 mg/dL (ref 6–23)
CO2: 24 mEq/L (ref 19–32)
Chloride: 103 mEq/L (ref 96–112)
Glucose, Bld: 101 mg/dL — ABNORMAL HIGH (ref 70–99)
Potassium: 4.5 mEq/L (ref 3.5–5.3)
Sodium: 140 mEq/L (ref 135–145)

## 2011-08-08 ENCOUNTER — Encounter: Payer: Self-pay | Admitting: Internal Medicine

## 2011-08-08 ENCOUNTER — Telehealth: Payer: Self-pay | Admitting: Internal Medicine

## 2011-08-08 ENCOUNTER — Ambulatory Visit (INDEPENDENT_AMBULATORY_CARE_PROVIDER_SITE_OTHER): Payer: Managed Care, Other (non HMO) | Admitting: Internal Medicine

## 2011-08-08 DIAGNOSIS — E785 Hyperlipidemia, unspecified: Secondary | ICD-10-CM

## 2011-08-08 DIAGNOSIS — E119 Type 2 diabetes mellitus without complications: Secondary | ICD-10-CM

## 2011-08-08 DIAGNOSIS — I10 Essential (primary) hypertension: Secondary | ICD-10-CM

## 2011-08-08 MED ORDER — ATORVASTATIN CALCIUM 20 MG PO TABS: 10.0000 mg | ORAL_TABLET | Freq: Every day | ORAL | Status: DC

## 2011-08-08 NOTE — Assessment & Plan Note (Signed)
Encouraged diabetic diet, exercise and wt loss. a1c target <6.5. Obtain cbc, chem7 and a1c prior to next visit

## 2011-08-08 NOTE — Assessment & Plan Note (Signed)
suboptimal ldl control. Increase lipitor 20mg  qd. Obtain lipid/lft with next visit

## 2011-08-08 NOTE — Progress Notes (Signed)
  Subjective:    Patient ID: Katelyn Fisher, female    DOB: [birth year], 61 y.o.   MRN: [id number]  HPI Pt presents to clinic for followup of multiple medical problems. BP under average control taking 1/2 dose bystolic. Tolerates statin tx without myalgia or abn lft. Reviewed elevated ldl. Diabetic eye exam utd. Attempting exercise and wt down 3lbs since last visit.  Past Medical History  Diagnosis Date  . Asthma   . Hypertension   . Obesity   . Hx of colonic polyps   . GERD (gastroesophageal reflux disease)   . History of colonic diverticulitis    Past Surgical History  Procedure Date  . Cholecystectomy 1991  . Dilation and curettage of uterus   . Tubal ligation   . Appendectomy     reports that she has never smoked. She has never used smokeless tobacco. She reports that she does not drink alcohol or use illicit drugs. family history includes Cancer in her maternal aunt; Diabetes in her father and mother; Heart disease in her maternal aunt; Hyperlipidemia in her brother and sister; Hypertension in her maternal aunt; Other in an unspecified family member; and Prostate cancer in her father. Allergies  Allergen Reactions  . Iodine     Rash  . Penicillins     REACTION: unspecified      Review of Systems see hpi    Objective:   Physical Exam  Physical Exam  Nursing note and vitals reviewed. Constitutional: Appears well-developed and well-nourished. No distress.  HENT:  Head: Normocephalic and atraumatic.  Right Ear: External ear normal.  Left Ear: External ear normal.  Eyes: Conjunctivae are normal. No scleral icterus.  Neck: Neck supple. Carotid bruit is not present.  Cardiovascular: Normal rate, regular rhythm and normal heart sounds.  Exam reveals no gallop and no friction rub.   No murmur heard. Pulmonary/Chest: Effort normal and breath sounds normal. No respiratory distress. He has no wheezes. no rales.  Lymphadenopathy:    He has no cervical adenopathy.    Neurological:Alert.  Skin: Skin is warm and dry. Not diaphoretic.  Psychiatric: Has a normal mood and affect.  Diabetic foot exam: +2 DP pulses, no diabetic wounds, ulcerations or significant callousing. Monofilament exam nl.       Assessment & Plan:

## 2011-08-08 NOTE — Patient Instructions (Signed)
Please schedule fasting labs prior to next appointment Cbc, chem7, a1c 250.0 and lipid/lft 272.4 

## 2011-08-08 NOTE — Assessment & Plan Note (Signed)
Normotensive and stable. Continue current regimen. Monitor bp as outpt and followup in clinic as scheduled.  

## 2011-08-17 ENCOUNTER — Ambulatory Visit (HOSPITAL_BASED_OUTPATIENT_CLINIC_OR_DEPARTMENT_OTHER)
Admission: RE | Admit: 2011-08-17 | Discharge: 2011-08-17 | Disposition: A | Discharge status: Home / Self Care | Payer: Managed Care, Other (non HMO) | Source: Ambulatory Visit | Attending: Internal Medicine | Admitting: Internal Medicine

## 2011-08-17 DIAGNOSIS — Z1231 Encounter for screening mammogram for malignant neoplasm of breast: Secondary | ICD-10-CM

## 2011-08-23 ENCOUNTER — Other Ambulatory Visit: Payer: Self-pay | Admitting: Internal Medicine

## 2011-08-23 DIAGNOSIS — R928 Other abnormal and inconclusive findings on diagnostic imaging of breast: Secondary | ICD-10-CM

## 2011-08-24 ENCOUNTER — Telehealth: Payer: Self-pay | Admitting: *Deleted

## 2011-08-24 NOTE — Telephone Encounter (Signed)
Lab orders entered for April 2013. 

## 2011-08-24 NOTE — Telephone Encounter (Signed)
Radiologist was concerned about left breast. There is an area they want to make sure doesn't show a growth. Basic screening mammogram isn't good enough for that so they were scheduling an ultrasound and a detailed diagnostic mammogram of that left breast area itself. Hopefully is scheduled near future?

## 2011-08-24 NOTE — Telephone Encounter (Signed)
Call placed to patient [phone], she was informed per Dr Rodena Medin instructions and has verbalized understanding. She states she is scheduled for diagnostic on 09/03/2011.

## 2011-08-24 NOTE — Telephone Encounter (Signed)
Patient called and left voice message stating she received a phone call from the breast center to come back for a follow up mammogram. She would like to know if Dr Rodena Medin would review results inform her of her mammogram results.

## 2011-09-03 ENCOUNTER — Ambulatory Visit
Admission: RE | Admit: 2011-09-03 | Discharge: 2011-09-03 | Disposition: A | Discharge status: Home / Self Care | Payer: Managed Care, Other (non HMO) | Source: Ambulatory Visit | Attending: Internal Medicine | Admitting: Internal Medicine

## 2011-09-03 DIAGNOSIS — R928 Other abnormal and inconclusive findings on diagnostic imaging of breast: Secondary | ICD-10-CM

## 2011-10-05 ENCOUNTER — Encounter: Payer: Self-pay | Admitting: Gastroenterology

## 2011-10-28 ENCOUNTER — Other Ambulatory Visit: Payer: Self-pay | Admitting: Internal Medicine

## 2011-10-29 NOTE — Telephone Encounter (Signed)
Rx refill sent to pharmacy. 

## 2011-10-31 LAB — BASIC METABOLIC PANEL
Calcium: 9.8 mg/dL (ref 8.4–10.5)
Chloride: 106 mEq/L (ref 96–112)
Creat: 0.72 mg/dL (ref 0.50–1.10)

## 2011-10-31 LAB — CBC
MCV: 66.9 fL — ABNORMAL LOW (ref 78.0–100.0)
Platelets: 212 10*3/uL (ref 150–400)
RDW: 16.1 % — ABNORMAL HIGH (ref 11.5–15.5)
WBC: 4.7 10*3/uL (ref 4.0–10.5)

## 2011-10-31 LAB — LIPID PANEL: Cholesterol: 137 mg/dL (ref 0–200)

## 2011-10-31 LAB — HEPATIC FUNCTION PANEL
ALT: 24 U/L (ref 0–35)
AST: 25 U/L (ref 0–37)
Bilirubin, Direct: 0.1 mg/dL (ref 0.0–0.3)
Indirect Bilirubin: 0.2 mg/dL (ref 0.0–0.9)

## 2011-10-31 LAB — HEMOGLOBIN A1C: Mean Plasma Glucose: 148 mg/dL — ABNORMAL HIGH (ref <117)

## 2011-10-31 NOTE — Telephone Encounter (Signed)
Addended by: Mervin Kung A on: 10/31/2011 11:16 AM   Modules accepted: Orders

## 2011-10-31 NOTE — Telephone Encounter (Signed)
Pt presented to the lab, order released and given to the lab.

## 2011-11-05 ENCOUNTER — Ambulatory Visit (AMBULATORY_SURGERY_CENTER): Payer: Private Health Insurance - Indemnity | Admitting: *Deleted

## 2011-11-05 VITALS — Ht 62.0 in | Wt 249.5 lb

## 2011-11-05 DIAGNOSIS — Z1211 Encounter for screening for malignant neoplasm of colon: Secondary | ICD-10-CM

## 2011-11-05 MED ORDER — PEG-KCL-NACL-NASULF-NA ASC-C 100 G PO SOLR: 1.0000 | Freq: Once | ORAL | Status: DC

## 2011-11-06 ENCOUNTER — Encounter: Payer: Self-pay | Admitting: Internal Medicine

## 2011-11-06 ENCOUNTER — Telehealth: Payer: Self-pay | Admitting: Internal Medicine

## 2011-11-06 ENCOUNTER — Ambulatory Visit (INDEPENDENT_AMBULATORY_CARE_PROVIDER_SITE_OTHER): Payer: Private Health Insurance - Indemnity | Admitting: Internal Medicine

## 2011-11-06 VITALS — BP 110/80 | HR 78 | Temp 98.0°F | Resp 18 | Wt 247.0 lb

## 2011-11-06 DIAGNOSIS — E119 Type 2 diabetes mellitus without complications: Secondary | ICD-10-CM

## 2011-11-06 DIAGNOSIS — I10 Essential (primary) hypertension: Secondary | ICD-10-CM

## 2011-11-06 DIAGNOSIS — D649 Anemia, unspecified: Secondary | ICD-10-CM

## 2011-11-06 NOTE — Telephone Encounter (Signed)
Lab orders entered for July 2013. 

## 2011-11-06 NOTE — Patient Instructions (Signed)
Please schedule labs prior to next visit Cbc, ferritin, hgb electropheresis-anemia and chem7, a1c-250.0

## 2011-11-07 DIAGNOSIS — D649 Anemia, unspecified: Secondary | ICD-10-CM

## 2011-11-07 HISTORY — DX: Anemia, unspecified: D64.9

## 2011-11-07 NOTE — Assessment & Plan Note (Signed)
Normotensive and stable. Continue current regimen. Monitor bp as outpt and followup in clinic as scheduled.  

## 2011-11-07 NOTE — Assessment & Plan Note (Signed)
Chronic intermittent. Obtain ferritin, cbc and hgb electropheresis prior to next visit

## 2011-11-07 NOTE — Progress Notes (Signed)
  Subjective:    Patient ID: Jones Broom, female    DOB: 12-16-1950, 61 y.o.   MRN: [id number]  HPI Pt presents to clinic for followup of multiple medical problems. Reviewed mild anemia without gross active bleeding.  BP reviewed normotensive. utd with gyn who is scheduling bmd.   Past Medical History  Diagnosis Date  . Asthma   . Hypertension   . Obesity   . Hx of colonic polyps   . GERD (gastroesophageal reflux disease)   . History of colonic diverticulitis   . Arthritis     back  . Hyperlipidemia   . Depression    Past Surgical History  Procedure Date  . Cholecystectomy 1991  . Dilation and curettage of uterus   . Tubal ligation   . Appendectomy     reports that she has never smoked. She has never used smokeless tobacco. She reports that she does not drink alcohol or use illicit drugs. family history includes Cancer in her maternal aunt; Diabetes in her father and mother; Heart disease in her maternal aunt; Hyperlipidemia in her brother and sister; Hypertension in her maternal aunt; Other in an unspecified family member; and Prostate cancer in her father. Allergies  Allergen Reactions  . Shellfish Allergy Hives, Shortness Of Breath and Swelling  . Iodine     Rash  . Penicillins     REACTION: unspecified      Review of Systems see hpi     Objective:   Physical Exam  Physical Exam  Nursing note and vitals reviewed. Constitutional: Appears well-developed and well-nourished. No distress.  HENT:  Head: Normocephalic and atraumatic.  Right Ear: External ear normal.  Left Ear: External ear normal.  Eyes: Conjunctivae are normal. No scleral icterus.  Neck: Neck supple. Carotid bruit is not present.  Cardiovascular: Normal rate, regular rhythm and normal heart sounds.  Exam reveals no gallop and no friction rub.   No murmur heard. Pulmonary/Chest: Effort normal and breath sounds normal. No respiratory distress. He has no wheezes. no rales.  Lymphadenopathy:   He has no cervical adenopathy.  Neurological:Alert.  Skin: Skin is warm and dry. Not diaphoretic.  Psychiatric: Has a normal mood and affect.        Assessment & Plan:

## 2011-11-07 NOTE — Assessment & Plan Note (Addendum)
Average control. Attempt weight loss. Continue current regimen. Next visit consider possible stress test due to risk factors.

## 2011-11-16 ENCOUNTER — Ambulatory Visit (AMBULATORY_SURGERY_CENTER): Payer: Private Health Insurance - Indemnity | Admitting: Gastroenterology

## 2011-11-16 ENCOUNTER — Encounter: Payer: Self-pay | Admitting: Gastroenterology

## 2011-11-16 VITALS — BP 158/90 | HR 89 | Temp 96.5°F | Resp 20 | Ht 62.0 in | Wt 249.0 lb

## 2011-11-16 DIAGNOSIS — Z1211 Encounter for screening for malignant neoplasm of colon: Secondary | ICD-10-CM

## 2011-11-16 MED ORDER — SODIUM CHLORIDE 0.9 % IV SOLN: 500.0000 mL | INTRAVENOUS | Status: DC

## 2011-11-16 NOTE — Op Note (Signed)
[facility] [address] [place], [place]  [address]  COLONOSCOPY PROCEDURE REPORT  PATIENT:  Katelyn Fisher, Katelyn Fisher  MR#:  [id number] BIRTHDATE:  1951-01-30, 60 yrs. old  GENDER:  female ENDOSCOPIST:  Vania Rea. Jarold Motto, MD, [facility] REF. BY:  Charlynn Court, M.D. PROCEDURE DATE:  11/16/2011 PROCEDURE:  Colonoscopy 19147 ASA CLASS:  Class III INDICATIONS:  history of pre-cancerous (adenomatous) colon polyps  MEDICATIONS:   propofol (Diprivan) 130 mg IV  DESCRIPTION OF PROCEDURE:   After the risks and benefits and of the procedure were explained, informed consent was obtained. Digital rectal exam was performed and revealed no abnormalities. The LB [id number]-[id number] endoscope was introduced through the anus and advanced to the cecum, which was identified by both the appendix and ileocecal valve.  The quality of the prep was good, using MoviPrep.  The instrument was then slowly withdrawn as the colon was fully examined. <<PROCEDUREIMAGES>>  FINDINGS:  No polyps or cancers were seen.  This was otherwise a normal examination of the colon.   Retroflexed views in the rectum revealed no abnormalities.    The scope was then withdrawn from the patient and the procedure completed.  COMPLICATIONS:  None ENDOSCOPIC IMPRESSION: 1) No polyps or cancers 2) Otherwise normal examination RECOMMENDATIONS: 1) Repeat Colonoscopy in 5 years.  REPEAT EXAM:  No  ______________________________ Vania Rea. Jarold Motto, MD, [facility]  CC:  n. eSIGNED:   Vania Rea. Azel Gumina at 11/16/2011 09:37 AM  [place], [place], [phone]

## 2011-11-16 NOTE — Patient Instructions (Signed)

## 2011-11-16 NOTE — Progress Notes (Signed)
Patient did not experience any of the following events: a burn prior to discharge; a fall within the facility; wrong site/side/patient/procedure/implant event; or a hospital transfer or hospital admission upon discharge from the facility. (G8907) Patient did not have preoperative order for IV antibiotic SSI prophylaxis. (G8918)  

## 2011-11-19 ENCOUNTER — Telehealth: Payer: Self-pay | Admitting: *Deleted

## 2011-11-19 NOTE — Telephone Encounter (Signed)
  Follow up Call-  Call back number 11/16/2011  Post procedure Call Back phone  # [phone]  Permission to leave phone message Yes     Patient questions:  Do you have a fever, pain , or abdominal swelling? no Pain Score  0 *  Have you tolerated food without any problems? yes  Have you been able to return to your normal activities? yes  Do you have any questions about your discharge instructions: Diet   no Medications  no Follow up visit  no  Do you have questions or concerns about your Care? no  Actions: * If pain score is 4 or above: No action needed, pain <4.

## 2011-11-29 ENCOUNTER — Other Ambulatory Visit: Payer: Self-pay | Admitting: Internal Medicine

## 2011-12-10 ENCOUNTER — Telehealth: Payer: Self-pay | Admitting: Internal Medicine

## 2011-12-10 MED ORDER — NEBIVOLOL HCL 10 MG PO TABS: 10.0000 mg | ORAL_TABLET | Freq: Every day | ORAL | Status: DC

## 2011-12-10 NOTE — Telephone Encounter (Signed)
Refill- bystolic 10mg  tab. Take one tablet by mouth one time daily. Qty 90 last fill 1.9.13

## 2011-12-10 NOTE — Telephone Encounter (Signed)
Rx refill sent to pharmacy. 

## 2012-01-02 ENCOUNTER — Other Ambulatory Visit (HOSPITAL_COMMUNITY): Payer: Self-pay | Admitting: Obstetrics & Gynecology

## 2012-01-02 DIAGNOSIS — Z79890 Hormone replacement therapy (postmenopausal): Secondary | ICD-10-CM

## 2012-01-11 ENCOUNTER — Ambulatory Visit (HOSPITAL_COMMUNITY)
Admission: RE | Admit: 2012-01-11 | Discharge: 2012-01-11 | Disposition: A | Discharge status: Home / Self Care | Payer: Private Health Insurance - Indemnity | Source: Ambulatory Visit | Attending: Obstetrics & Gynecology | Admitting: Obstetrics & Gynecology

## 2012-01-11 DIAGNOSIS — Z13820 Encounter for screening for osteoporosis: Secondary | ICD-10-CM | POA: Insufficient documentation

## 2012-01-11 DIAGNOSIS — Z780 Asymptomatic menopausal state: Secondary | ICD-10-CM | POA: Insufficient documentation

## 2012-01-11 DIAGNOSIS — Z79890 Hormone replacement therapy (postmenopausal): Secondary | ICD-10-CM

## 2012-02-01 LAB — CBC
Hemoglobin: 12.5 g/dL (ref 12.0–15.0)
MCH: 20.6 pg — ABNORMAL LOW (ref 26.0–34.0)
MCHC: 30.9 g/dL (ref 30.0–36.0)
Platelets: 239 10*3/uL (ref 150–400)
RDW: 17.5 % — ABNORMAL HIGH (ref 11.5–15.5)

## 2012-02-01 LAB — BASIC METABOLIC PANEL
BUN: 10 mg/dL (ref 6–23)
CO2: 27 mEq/L (ref 19–32)
Chloride: 106 mEq/L (ref 96–112)
Creat: 0.75 mg/dL (ref 0.50–1.10)
Potassium: 4.5 mEq/L (ref 3.5–5.3)

## 2012-02-01 LAB — HEMOGLOBIN A1C: Mean Plasma Glucose: 137 mg/dL — ABNORMAL HIGH (ref <117)

## 2012-02-01 NOTE — Addendum Note (Signed)
Addended by: Elnora Morrison on: 02/01/2012 10:57 AM   Modules accepted: Orders

## 2012-02-02 LAB — FERRITIN: Ferritin: 27 ng/mL (ref 10–291)

## 2012-02-05 LAB — HEMOGLOBINOPATHY EVALUATION: Hgb A: 97.7 % (ref 96.8–97.8)

## 2012-02-07 ENCOUNTER — Encounter: Payer: Self-pay | Admitting: Internal Medicine

## 2012-02-07 ENCOUNTER — Ambulatory Visit (INDEPENDENT_AMBULATORY_CARE_PROVIDER_SITE_OTHER): Payer: Private Health Insurance - Indemnity | Admitting: Internal Medicine

## 2012-02-07 VITALS — BP 134/94 | HR 71 | Temp 98.1°F | Resp 16 | Ht 62.0 in | Wt 253.0 lb

## 2012-02-07 DIAGNOSIS — E669 Obesity, unspecified: Secondary | ICD-10-CM

## 2012-02-07 DIAGNOSIS — E119 Type 2 diabetes mellitus without complications: Secondary | ICD-10-CM

## 2012-02-07 DIAGNOSIS — E785 Hyperlipidemia, unspecified: Secondary | ICD-10-CM

## 2012-02-07 NOTE — Assessment & Plan Note (Signed)
Stable. Obtain lipid/lft prior to next visit. 

## 2012-02-07 NOTE — Assessment & Plan Note (Signed)
Good control. Continue current regimen. Encouraged wt loss.

## 2012-02-07 NOTE — Patient Instructions (Signed)
Please schedule fasting labs prior to next visit Chem7, a1c-250.0 and lipid/lft-272.4 

## 2012-02-07 NOTE — Progress Notes (Signed)
  Subjective:    Patient ID: Katelyn Fisher, female    DOB: [birth year], 61 y.o.   MRN: [id number]  HPI Pt presents to clinic for followup of multiple medical problems. BP initially elevated and rechecked to be 124/86. States has headache today. BP typically normotensive. Reviewed a1c improved and at goal. BMD utd with gyn.   Past Medical History  Diagnosis Date  . Asthma   . Hypertension   . Obesity   . Hx of colonic polyps   . GERD (gastroesophageal reflux disease)   . History of colonic diverticulitis   . Arthritis     back  . Hyperlipidemia   . Depression    Past Surgical History  Procedure Date  . Cholecystectomy 1991  . Dilation and curettage of uterus   . Tubal ligation   . Appendectomy     reports that she has never smoked. She has never used smokeless tobacco. She reports that she does not drink alcohol or use illicit drugs. family history includes Cancer in her maternal aunt; Diabetes in her father and mother; Heart disease in her maternal aunt; Hyperlipidemia in her brother and sister; Hypertension in her maternal aunt; Other in an unspecified family member; and Prostate cancer in her father. Allergies  Allergen Reactions  . Shellfish Allergy Hives, Shortness Of Breath and Swelling  . Iodine     Rash  . Penicillins     REACTION: unspecified      Review of Systems see hpi     Objective:   Physical Exam  Physical Exam  Nursing note and vitals reviewed. Constitutional: Appears well-developed and well-nourished. No distress.  HENT: nl bilateral tm's and canals Head: Normocephalic and atraumatic.  Right Ear: External ear normal.  Left Ear: External ear normal.  Eyes: Conjunctivae are normal. No scleral icterus.  Neck: Neck supple. Carotid bruit is not present.  Cardiovascular: Normal rate, regular rhythm and normal heart sounds.  Exam reveals no gallop and no friction rub.   No murmur heard. Pulmonary/Chest: Effort normal and breath sounds normal. No  respiratory distress. He has no wheezes. no rales.  Lymphadenopathy:    He has no cervical adenopathy.  Neurological:Alert.  Skin: Skin is warm and dry. Not diaphoretic.  Psychiatric: Has a normal mood and affect.        Assessment & Plan:

## 2012-02-07 NOTE — Assessment & Plan Note (Signed)
Weight increased. Encouraged dietary modification, exercise and weight loss.

## 2012-03-07 ENCOUNTER — Encounter: Payer: Self-pay | Admitting: Internal Medicine

## 2012-03-13 ENCOUNTER — Telehealth: Payer: Self-pay | Admitting: *Deleted

## 2012-03-13 MED ORDER — NEBIVOLOL HCL 10 MG PO TABS: 5.0000 mg | ORAL_TABLET | Freq: Every day | ORAL | Status: DC

## 2012-03-13 NOTE — Telephone Encounter (Signed)
Pt informed Samples requested ready for P/U Mon-Fri 8a-5p/SLS

## 2012-03-27 ENCOUNTER — Other Ambulatory Visit: Payer: Self-pay | Admitting: Internal Medicine

## 2012-05-05 ENCOUNTER — Encounter: Payer: Self-pay | Admitting: Internal Medicine

## 2012-05-05 ENCOUNTER — Ambulatory Visit (INDEPENDENT_AMBULATORY_CARE_PROVIDER_SITE_OTHER): Payer: Managed Care, Other (non HMO) | Admitting: Internal Medicine

## 2012-05-05 VITALS — BP 120/98 | HR 80 | Temp 98.0°F | Resp 16 | Wt 250.8 lb

## 2012-05-05 DIAGNOSIS — J069 Acute upper respiratory infection, unspecified: Secondary | ICD-10-CM

## 2012-05-05 MED ORDER — DOXYCYCLINE HYCLATE 100 MG PO TABS: 100.0000 mg | ORAL_TABLET | Freq: Two times a day (BID) | ORAL | Status: DC

## 2012-05-05 MED ORDER — HYDROCOD POLST-CHLORPHEN POLST 10-8 MG/5ML PO LQCR: 5.0000 mL | Freq: Two times a day (BID) | ORAL | Status: DC | PRN

## 2012-05-05 NOTE — Assessment & Plan Note (Signed)
Given intermittent associated wheezing begin antibiotic therapy. Continue albuterol MDI when necessary. Followup closely if no improvement or worsening.

## 2012-05-05 NOTE — Progress Notes (Signed)
  Subjective:    Patient ID: Katelyn Fisher, female    DOB: [birth year], 61 y.o.   MRN: [id number]  HPI Pt presents to clinic for evaluation of cough. Notes a five-day history of cough productive for clear white sputum without hemoptysis. Notes intermittent wheezing without shortness of breath. Uses albuterol MDI at home when necessary. No fever or chills. No known sick exposure. No other alleviating or exacerbating factors.  Past Medical History  Diagnosis Date  . Asthma   . Hypertension   . Obesity   . Hx of colonic polyps   . GERD (gastroesophageal reflux disease)   . History of colonic diverticulitis   . Arthritis     back  . Hyperlipidemia   . Depression    Past Surgical History  Procedure Date  . Cholecystectomy 1991  . Dilation and curettage of uterus   . Tubal ligation   . Appendectomy     reports that she has never smoked. She has never used smokeless tobacco. She reports that she does not drink alcohol or use illicit drugs. family history includes Cancer in her maternal aunt; Diabetes in her father and mother; Heart disease in her maternal aunt; Hyperlipidemia in her brother and sister; Hypertension in her maternal aunt; Other in an unspecified family member; and Prostate cancer in her father. Allergies  Allergen Reactions  . Shellfish Allergy Hives, Shortness Of Breath and Swelling  . Iodine     Rash  . Penicillins     REACTION: unspecified     Review of Systems see hpi     Objective:   Physical Exam  Nursing note and vitals reviewed. Constitutional: She appears well-developed and well-nourished. No distress.  HENT:  Head: Normocephalic and atraumatic.  Right Ear: Tympanic membrane, external ear and ear canal normal.  Left Ear: Tympanic membrane, external ear and ear canal normal.  Nose: Nose normal.  Mouth/Throat: Oropharynx is clear and moist. No oropharyngeal exudate.  Eyes: Conjunctivae normal are normal. No scleral icterus.  Neck: Neck supple.    Cardiovascular: Normal rate, regular rhythm and normal heart sounds.   Pulmonary/Chest: Effort normal and breath sounds normal. No respiratory distress. She has no wheezes. She has no rales.  Lymphadenopathy:    She has no cervical adenopathy.  Neurological: She is alert.  Skin: Skin is warm and dry. She is not diaphoretic.  Psychiatric: She has a normal mood and affect.          Assessment & Plan:

## 2012-05-08 ENCOUNTER — Ambulatory Visit: Payer: Private Health Insurance - Indemnity | Admitting: Internal Medicine

## 2012-05-13 ENCOUNTER — Ambulatory Visit (INDEPENDENT_AMBULATORY_CARE_PROVIDER_SITE_OTHER): Payer: Managed Care, Other (non HMO) | Admitting: Family

## 2012-05-13 ENCOUNTER — Telehealth: Payer: Self-pay | Admitting: Internal Medicine

## 2012-05-13 ENCOUNTER — Telehealth: Payer: Self-pay | Admitting: Family

## 2012-05-13 ENCOUNTER — Encounter: Payer: Self-pay | Admitting: Family

## 2012-05-13 ENCOUNTER — Ambulatory Visit (HOSPITAL_BASED_OUTPATIENT_CLINIC_OR_DEPARTMENT_OTHER)
Admission: RE | Admit: 2012-05-13 | Discharge: 2012-05-13 | Disposition: A | Discharge status: Home / Self Care | Payer: Managed Care, Other (non HMO) | Source: Ambulatory Visit | Attending: Family | Admitting: Family

## 2012-05-13 VITALS — BP 128/100 | HR 107 | Temp 98.2°F | Resp 18 | Wt 246.1 lb

## 2012-05-13 DIAGNOSIS — R05 Cough: Secondary | ICD-10-CM | POA: Insufficient documentation

## 2012-05-13 DIAGNOSIS — R059 Cough, unspecified: Secondary | ICD-10-CM | POA: Insufficient documentation

## 2012-05-13 DIAGNOSIS — J40 Bronchitis, not specified as acute or chronic: Secondary | ICD-10-CM

## 2012-05-13 MED ORDER — PREDNISONE 10 MG PO TABS: ORAL_TABLET | ORAL | Status: DC

## 2012-05-13 MED ORDER — AZITHROMYCIN 250 MG PO TABS: ORAL_TABLET | ORAL | Status: DC

## 2012-05-13 NOTE — Telephone Encounter (Signed)
Caller: Nessie/Patient; Patient Name: Katelyn Fisher; PCP: Marguarite Arbour (Adults only); Best Callback Phone Number: [phone] Onset- 2 weeks  cough.  Temp- 100.8  Pt c/o of productive cough  with yellow-green phlegm, fever,yellow -green  nasal drainage. She finished Doxycycline on yesterday and wants to know if doctor will call in more antibiotics. Pt does have Asthma. Denies wheezing this morning, but does have wheezing on/off. Emergent s/s of Asthma  protocol r/o. Call provider immediately. Per protocol pt must be seen for antibiotics, appointment scheduled for today at 1:15pm with Lucile Crater.

## 2012-05-13 NOTE — Progress Notes (Signed)
Subjective:    Patient ID: Katelyn Fisher, female    DOB: Mar 31, 1951, 61 y.o.   MRN: [id number]  HPI  Ms.  Coote is a 61 yr old female who presents today with chief complaint of cough. She was initially seen on 10/14 and was placed on doxy and tussionex.  She continues to cough. She reports low grade temp 100.1 on Sunday.  She completed doxy on Sunday. Cough is productive of yellow sputum. She reports resolution of wheezing. She reports mild nasal congestion and occasional nasal drainage. She has not taken her BP meds today.  She reports that she has not been using symbicort every day. She is using albuterol prn. Review of Systems see HPI    Past Medical History  Diagnosis Date  . Asthma   . Hypertension   . Obesity   . Hx of colonic polyps   . GERD (gastroesophageal reflux disease)   . History of colonic diverticulitis   . Arthritis     back  . Hyperlipidemia   . Depression     History   Social History  . Marital Status: Married    Spouse Name: N/A    Number of Children: N/A  . Years of Education: N/A   Occupational History  . Not on file.   Social History Main Topics  . Smoking status: Never Smoker   . Smokeless tobacco: Never Used  . Alcohol Use: No  . Drug Use: No  . Sexually Active: Not on file   Other Topics Concern  . Not on file   Social History Narrative   Married- 40 yearsNever SmokedAlcohol use-noDrug use-noDaily Caffeine UseOccupation: housewifeCaffeine use/day:  NoneDoes Patient Exercise:  yes    Past Surgical History  Procedure Date  . Cholecystectomy 1991  . Dilation and curettage of uterus   . Tubal ligation   . Appendectomy     Family History  Problem Relation Age of Onset  . Heart disease Maternal Aunt     x 3 -2 brothers  . Hypertension Maternal Aunt   . Cancer Maternal Aunt     2 maternal aunts  . Prostate cancer Father   . Hyperlipidemia Brother   . Hyperlipidemia Sister   . Other      no FH colon cancer  . Diabetes Mother    . Diabetes Father     Allergies  Allergen Reactions  . Shellfish Allergy Hives, Shortness Of Breath and Swelling  . Iodine     Rash  . Penicillins     REACTION: unspecified    Current Outpatient Prescriptions on File Prior to Visit  Medication Sig Dispense Refill  . albuterol (PROAIR HFA) 108 (90 BASE) MCG/ACT inhaler Inhale 2 puffs into the lungs 2 (two) times daily as needed.        Marland Kitchen amLODipine-benazepril (LOTREL) 10-40 MG per capsule TAKE ONE CAPSULE BY MOUTH ONE TIME DAILY  30 capsule  1  . aspirin 81 MG tablet Take 81 mg by mouth daily.        Marland Kitchen atorvastatin (LIPITOR) 20 MG tablet Take 0.5 tablets (10 mg total) by mouth daily.  30 tablet  6  . b complex vitamins capsule Take 1 capsule by mouth daily.        . budesonide-formoterol (SYMBICORT) 160-4.5 MCG/ACT inhaler Inhale 2 puffs into the lungs as needed.      . chlorpheniramine-HYDROcodone (TUSSIONEX PENNKINETIC ER) 10-8 MG/5ML LQCR Take 5 mLs by mouth every 12 (twelve) hours as needed.  140  mL  0  . furosemide (LASIX) 20 MG tablet TAKE ONE TABLET BY MOUTH ONE TIME DAILY  30 tablet  5  . meloxicam (MOBIC) 15 MG tablet Take 15 mg by mouth daily.      . Multiple Vitamin (MULTIVITAMIN) tablet Take 1 tablet by mouth daily.      . nebivolol (BYSTOLIC) 10 MG tablet Take 0.5 tablets (5 mg total) by mouth daily.  28 tablet  0  . pantoprazole (PROTONIX) 40 MG tablet Take 1 tablet (40 mg total) by mouth daily.  90 tablet  1  . pentoxifylline (TRENTAL) 400 MG CR tablet Take 400 mg by mouth 3 (three) times daily with meals.        . traMADol (ULTRAM) 50 MG tablet Take 50 mg by mouth every 6 (six) hours as needed.        . triamcinolone (KENALOG) 0.1 % cream Apply 1 application topically. Apply topically to affected area once daily       . Wheat Dextrin (BENEFIBER) TABS Take by mouth daily.        Marland Kitchen doxycycline (VIBRA-TABS) 100 MG tablet Take 1 tablet (100 mg total) by mouth 2 (two) times daily.  14 tablet  0    BP 128/100  Pulse 107   Temp 98.2 F (36.8 C) (Oral)  Resp 18  Wt 246 lb 1.3 oz (111.621 kg)  SpO2 99%    Objective:   Physical Exam  Constitutional: She is oriented to person, place, and time. She appears well-developed and well-nourished. No distress.  HENT:  Head: Normocephalic and atraumatic.  Right Ear: Tympanic membrane and ear canal normal.  Left Ear: Tympanic membrane and ear canal normal.  Mouth/Throat: No posterior oropharyngeal erythema.  Cardiovascular: Normal rate and regular rhythm.   No murmur heard. Pulmonary/Chest: Effort normal.       Coarse upper airway rhonchi  Musculoskeletal: She exhibits no edema.  Neurological: She is alert and oriented to person, place, and time.  Skin: Skin is warm and dry. No rash noted. No erythema. No pallor.  Psychiatric: She has a normal mood and affect. Her behavior is normal. Judgment and thought content normal.          Assessment & Plan:

## 2012-05-13 NOTE — Patient Instructions (Addendum)
Please complete your chest xray on the first floor we will contact you with your results.

## 2012-05-13 NOTE — Assessment & Plan Note (Signed)
CXR neg for pneumonia today. Suspect bronchitis with asthma flare (despite lack of overt wheezing).  Pt is to resume symbicort (add prednisone taper with close monitoring of sugars) see phone note.  Rx with zpak.

## 2012-05-13 NOTE — Telephone Encounter (Addendum)
Called pt and notified her that CXR is negative for pneumonia.  I would like her to take zpak for bronchitis and a pred taper. Pt aware. She does not have a meter at home.  I would like for her to check her blood sugar at least once a day when she is on prednisone. She will come to the office tomorrow for a nurse visit at [facility] for meter and teaching.

## 2012-05-14 ENCOUNTER — Ambulatory Visit (INDEPENDENT_AMBULATORY_CARE_PROVIDER_SITE_OTHER): Payer: Managed Care, Other (non HMO) | Admitting: Family

## 2012-05-14 ENCOUNTER — Ambulatory Visit: Payer: Managed Care, Other (non HMO) | Admitting: Family

## 2012-05-14 VITALS — HR 85 | Temp 98.7°F | Resp 16

## 2012-05-14 DIAGNOSIS — M7918 Myalgia, other site: Secondary | ICD-10-CM

## 2012-05-14 DIAGNOSIS — IMO0001 Reserved for inherently not codable concepts without codable children: Secondary | ICD-10-CM

## 2012-05-14 NOTE — Telephone Encounter (Signed)
Pt presented to the office for glucometer teaching. Technique demonstrated and pt was able to successfully re-demonstrate technique. She is hesitant but feels that she can continue checking blood sugar at home. Advised pt to call me if she has any problems and she voices understanding.

## 2012-05-16 ENCOUNTER — Ambulatory Visit: Payer: Private Health Insurance - Indemnity | Admitting: Internal Medicine

## 2012-05-16 NOTE — Progress Notes (Signed)
  Subjective:    Patient ID: Katelyn Fisher, female    DOB: [birth year], 61 y.o.   MRN: [id number]  HPI  Katelyn Fisher presented today for glucometer teaching with CMA.  CMA noted pt to be tearful and I was asked to come evaluate the patient.  Pt reports + pain around her rib cage which she is worried about.   Reports that she continues to cough. She has not yet started prednisone or zpak.     Review of Systems See HPI     Objective:   Physical Exam  Constitutional: She appears well-developed and well-nourished.  Cardiovascular: Normal rate and regular rhythm.   No murmur heard. Pulmonary/Chest: Effort normal and breath sounds normal. No respiratory distress. She has no wheezes. She has no rales. She exhibits no tenderness.  Psychiatric:       tearful          Assessment & Plan:  Musculoskeletal pain- likely related to coughing. Recommended that she start prednisone, zpak, check sugars once daily, call if >250.

## 2012-05-19 ENCOUNTER — Telehealth: Payer: Self-pay | Admitting: *Deleted

## 2012-05-19 DIAGNOSIS — E119 Type 2 diabetes mellitus without complications: Secondary | ICD-10-CM

## 2012-05-19 DIAGNOSIS — E785 Hyperlipidemia, unspecified: Secondary | ICD-10-CM

## 2012-05-19 LAB — BASIC METABOLIC PANEL
BUN: 13 mg/dL (ref 6–23)
Chloride: 105 mEq/L (ref 96–112)
Glucose, Bld: 77 mg/dL (ref 70–99)
Potassium: 4.1 mEq/L (ref 3.5–5.3)
Sodium: 144 mEq/L (ref 135–145)

## 2012-05-19 LAB — HEPATIC FUNCTION PANEL
ALT: 25 U/L (ref 0–35)
AST: 17 U/L (ref 0–37)
Albumin: 3.9 g/dL (ref 3.5–5.2)
Alkaline Phosphatase: 80 U/L (ref 39–117)
Bilirubin, Direct: 0.1 mg/dL (ref 0.0–0.3)
Total Bilirubin: 0.3 mg/dL (ref 0.3–1.2)

## 2012-05-19 LAB — LIPID PANEL
Cholesterol: 155 mg/dL (ref 0–200)
Total CHOL/HDL Ratio: 3.4 Ratio
VLDL: 22 mg/dL (ref 0–40)

## 2012-05-19 NOTE — Telephone Encounter (Signed)
Pt presented to the lab and orders entered per 02/07/12 office visit:  Please schedule fasting labs prior to next visit  Chem7, a1c-250.0 and lipid/lft-272.4

## 2012-05-21 ENCOUNTER — Encounter: Payer: Self-pay | Admitting: Internal Medicine

## 2012-05-21 ENCOUNTER — Ambulatory Visit (INDEPENDENT_AMBULATORY_CARE_PROVIDER_SITE_OTHER): Payer: Managed Care, Other (non HMO) | Admitting: Internal Medicine

## 2012-05-21 VITALS — BP 138/98 | HR 61 | Temp 97.7°F | Resp 16 | Wt 247.0 lb

## 2012-05-21 DIAGNOSIS — J40 Bronchitis, not specified as acute or chronic: Secondary | ICD-10-CM

## 2012-05-21 DIAGNOSIS — Z23 Encounter for immunization: Secondary | ICD-10-CM

## 2012-05-21 DIAGNOSIS — I10 Essential (primary) hypertension: Secondary | ICD-10-CM

## 2012-05-21 DIAGNOSIS — E119 Type 2 diabetes mellitus without complications: Secondary | ICD-10-CM

## 2012-05-21 MED ORDER — BENAZEPRIL HCL 40 MG PO TABS: 40.0000 mg | ORAL_TABLET | Freq: Every day | ORAL | Status: DC

## 2012-05-21 MED ORDER — NEBIVOLOL HCL 20 MG PO TABS: ORAL_TABLET | ORAL | Status: DC

## 2012-05-21 MED ORDER — METHYLPREDNISOLONE 4 MG PO KIT: PACK | ORAL | Status: DC

## 2012-05-21 MED ORDER — ALBUTEROL SULFATE HFA 108 (90 BASE) MCG/ACT IN AERS: 2.0000 | INHALATION_SPRAY | Freq: Two times a day (BID) | RESPIRATORY_TRACT | Status: DC | PRN

## 2012-05-21 MED ORDER — FUROSEMIDE 20 MG PO TABS: 20.0000 mg | ORAL_TABLET | Freq: Every day | ORAL | Status: DC

## 2012-05-21 MED ORDER — ATORVASTATIN CALCIUM 20 MG PO TABS: 10.0000 mg | ORAL_TABLET | Freq: Every day | ORAL | Status: DC

## 2012-05-21 MED ORDER — AMLODIPINE BESYLATE 10 MG PO TABS: 10.0000 mg | ORAL_TABLET | Freq: Every day | ORAL | Status: DC

## 2012-05-21 MED ORDER — AZITHROMYCIN 250 MG PO TABS: ORAL_TABLET | ORAL | Status: DC

## 2012-05-21 MED ORDER — BUDESONIDE-FORMOTEROL FUMARATE 160-4.5 MCG/ACT IN AERO: 2.0000 | INHALATION_SPRAY | Freq: Two times a day (BID) | RESPIRATORY_TRACT | Status: DC

## 2012-05-21 NOTE — Progress Notes (Signed)
  Subjective:    Patient ID: Katelyn Fisher, female    DOB: February 20, 1951, 61 y.o.   MRN: [id number]  HPI Pt presents to clinic for followup of multiple medical problems. Recent upper respiratory infection with associated wheezing improved but not resolved. Now status post doxycycline, Zithromax and prednisone. Still notes mild intermittent wheezing and cough productive for yellow sputum. Blood pressure remains persistently elevated. Compliant with medication without adverse effect. A1c mildly above goal. Has been resistant to performing fingerstick blood sugar checks. Weight down 6 pounds.  Past Medical History  Diagnosis Date  . Asthma   . Hypertension   . Obesity   . Hx of colonic polyps   . GERD (gastroesophageal reflux disease)   . History of colonic diverticulitis   . Arthritis     back  . Hyperlipidemia   . Depression    Past Surgical History  Procedure Date  . Cholecystectomy 1991  . Dilation and curettage of uterus   . Tubal ligation   . Appendectomy     reports that she has never smoked. She has never used smokeless tobacco. She reports that she does not drink alcohol or use illicit drugs. family history includes Cancer in her maternal aunt; Diabetes in her father and mother; Heart disease in her maternal aunt; Hyperlipidemia in her brother and sister; Hypertension in her maternal aunt; Other in an unspecified family member; and Prostate cancer in her father. Allergies  Allergen Reactions  . Shellfish Allergy Hives, Shortness Of Breath and Swelling  . Iodine     Rash  . Penicillins     REACTION: unspecified      Review of Systems see hpi     Objective:   Physical Exam  Physical Exam  Nursing note and vitals reviewed. Constitutional: Appears well-developed and well-nourished. No distress.  HENT:  Head: Normocephalic and atraumatic.  Right Ear: External ear normal.  Left Ear: External ear normal.  Eyes: Conjunctivae are normal. No scleral icterus.  Neck: Neck  supple. Carotid bruit is not present.  Cardiovascular: Normal rate, regular rhythm and normal heart sounds.  Exam reveals no gallop and no friction rub.   No murmur heard. Pulmonary/Chest: Effort normal and breath sounds normal. No respiratory distress. He has no wheezes. no rales.  Lymphadenopathy:    He has no cervical adenopathy.  Neurological:Alert.  Skin: Skin is warm and dry. Not diaphoretic.  Psychiatric: Has a normal mood and affect.        Assessment & Plan:

## 2012-05-21 NOTE — Assessment & Plan Note (Signed)
Improving but not resolved. Repeat Z-Pak and attempt Medrol Dosepak. Followup if no improvement or worsening.

## 2012-05-21 NOTE — Assessment & Plan Note (Signed)
Suboptimal control. Increase bystolic 20 mg daily.

## 2012-05-21 NOTE — Assessment & Plan Note (Signed)
Average control. Recheck Chem-7 and A1c and urine microalbumin prior to next visit

## 2012-05-21 NOTE — Patient Instructions (Signed)
Please schedule labs prior to next visit Chem7, a1c, urine microalbumin-250.00 

## 2012-05-23 ENCOUNTER — Encounter: Payer: Self-pay | Admitting: Internal Medicine

## 2012-05-27 MED ORDER — ALBUTEROL SULFATE HFA 108 (90 BASE) MCG/ACT IN AERS: 2.0000 | INHALATION_SPRAY | Freq: Two times a day (BID) | RESPIRATORY_TRACT | Status: DC | PRN

## 2012-05-27 NOTE — Telephone Encounter (Signed)
Per conversation w/patient; Samples ready for p/u Bystolic [10 mg--take [2] tabs daily] & Symbicort, Albuterol to mail order

## 2012-05-28 ENCOUNTER — Other Ambulatory Visit: Payer: Self-pay | Admitting: *Deleted

## 2012-05-28 MED ORDER — ALBUTEROL SULFATE HFA 108 (90 BASE) MCG/ACT IN AERS: 2.0000 | INHALATION_SPRAY | Freq: Two times a day (BID) | RESPIRATORY_TRACT | Status: DC | PRN

## 2012-05-28 NOTE — Progress Notes (Signed)
Print in Error first Rx script/SLS

## 2012-07-11 ENCOUNTER — Other Ambulatory Visit: Payer: Self-pay | Admitting: Internal Medicine

## 2012-07-11 DIAGNOSIS — Z1231 Encounter for screening mammogram for malignant neoplasm of breast: Secondary | ICD-10-CM

## 2012-08-14 ENCOUNTER — Telehealth: Payer: Self-pay | Admitting: *Deleted

## 2012-08-14 DIAGNOSIS — E119 Type 2 diabetes mellitus without complications: Secondary | ICD-10-CM

## 2012-08-14 LAB — BASIC METABOLIC PANEL
Chloride: 106 mEq/L (ref 96–112)
Potassium: 4.4 mEq/L (ref 3.5–5.3)
Sodium: 141 mEq/L (ref 135–145)

## 2012-08-14 LAB — HEMOGLOBIN A1C
Hgb A1c MFr Bld: 6.5 % — ABNORMAL HIGH (ref <5.7)
Mean Plasma Glucose: 140 mg/dL — ABNORMAL HIGH (ref <117)

## 2012-08-14 NOTE — Telephone Encounter (Signed)
Pt presented to the lab, orders entered per 05/21/12 office note as below:    Please schedule labs prior to next visit  Chem7, a1c, urine microalbumin-250.00

## 2012-08-15 LAB — MICROALBUMIN / CREATININE URINE RATIO
Creatinine, Urine: 52.5 mg/dL
Microalb Creat Ratio: 9.5 mg/g (ref 0.0–30.0)

## 2012-08-18 ENCOUNTER — Other Ambulatory Visit: Payer: Self-pay | Admitting: Family Medicine

## 2012-08-18 ENCOUNTER — Ambulatory Visit (HOSPITAL_BASED_OUTPATIENT_CLINIC_OR_DEPARTMENT_OTHER)
Admission: RE | Admit: 2012-08-18 | Discharge: 2012-08-18 | Disposition: A | Discharge status: Home / Self Care | Payer: Medicare Other | Source: Ambulatory Visit | Attending: Internal Medicine | Admitting: Internal Medicine

## 2012-08-18 ENCOUNTER — Ambulatory Visit (INDEPENDENT_AMBULATORY_CARE_PROVIDER_SITE_OTHER): Payer: Medicare Other | Admitting: Family Medicine

## 2012-08-18 ENCOUNTER — Encounter: Payer: Self-pay | Admitting: Family Medicine

## 2012-08-18 VITALS — BP 142/92 | HR 78 | Temp 97.7°F | Ht 62.0 in | Wt 250.1 lb

## 2012-08-18 DIAGNOSIS — E669 Obesity, unspecified: Secondary | ICD-10-CM

## 2012-08-18 DIAGNOSIS — M545 Low back pain, unspecified: Secondary | ICD-10-CM

## 2012-08-18 DIAGNOSIS — Z1231 Encounter for screening mammogram for malignant neoplasm of breast: Secondary | ICD-10-CM | POA: Insufficient documentation

## 2012-08-18 DIAGNOSIS — E785 Hyperlipidemia, unspecified: Secondary | ICD-10-CM

## 2012-08-18 DIAGNOSIS — G4733 Obstructive sleep apnea (adult) (pediatric): Secondary | ICD-10-CM

## 2012-08-18 DIAGNOSIS — E119 Type 2 diabetes mellitus without complications: Secondary | ICD-10-CM

## 2012-08-18 DIAGNOSIS — I10 Essential (primary) hypertension: Secondary | ICD-10-CM

## 2012-08-18 HISTORY — DX: Low back pain, unspecified: M54.50

## 2012-08-18 NOTE — Assessment & Plan Note (Signed)
Uses her CPAP routinely and says it helps her sleep well

## 2012-08-18 NOTE — Assessment & Plan Note (Signed)
Patient requesting referral to new ortho office in the [place] area. She has previously seen ortho in [place]. She is advised to pick a copy of all imaging she has done in past prior to appt. Referral placed

## 2012-08-18 NOTE — Patient Instructions (Addendum)
Next visit 3-4 months. Labs prior to visit lipid, renal, hepatic, tsh, hgba1c, cbc   DASH diet No trans fats/partially hydrogenated oils Do a nonfat yogurt daily or a probiotic capsule like Digestive Advantage or Align daily

## 2012-08-18 NOTE — Assessment & Plan Note (Signed)
BP improved on recheck. Patient was late getting here secondary to getting lost. Will not change medications today. Of note she did not take her amlodipine and Asacol in the way up the door this morning. She is encouraged to make sure she takes her meds prior to next visit so we may further evaluate

## 2012-08-18 NOTE — Assessment & Plan Note (Addendum)
Improved control, continue Atorvastatin

## 2012-08-18 NOTE — Assessment & Plan Note (Signed)
She has been exercising well and reports a last visit. Encouraged DASH diet and ongoing exercise

## 2012-08-18 NOTE — Progress Notes (Signed)
Patient ID: Katelyn Fisher, female   DOB: 03-31-1951, 62 y.o.   MRN: [id number] Katelyn Fisher [id number] [birth year] 08/18/2012      Progress Note-Follow Up  Subjective  Chief Complaint  Chief Complaint  Patient presents with  . Follow-up    3 month    HPI  Patient is a 62 year old female who is in today for followup. Overall she is doing well. She is following with dermatology to hold for some psoriasis in her legs but finds that his probably this time. She is happy with some weight loss. She's trying to exercise more using the pool and exercising that way. Does continue to low back pain and radicular symptoms down the right leg at times. Is requesting a referral to an orthopedist in [place] area. Has an appt for GYN exam in April. Denies any acute complaints. No recent illness, fevers, chills, chest pain, palpitations, shortness of breath, GI or GU complaints.  Past Medical History  Diagnosis Date  . Asthma   . Hypertension   . Obesity   . Hx of colonic polyps   . GERD (gastroesophageal reflux disease)   . History of colonic diverticulitis   . Arthritis     back  . Hyperlipidemia   . Depression     Past Surgical History  Procedure Date  . Cholecystectomy 1991  . Dilation and curettage of uterus   . Tubal ligation   . Appendectomy     Family History  Problem Relation Age of Onset  . Heart disease Maternal Aunt     x 3 -2 brothers  . Hypertension Maternal Aunt   . Cancer Maternal Aunt     2 maternal aunts  . Prostate cancer Father   . Hyperlipidemia Brother   . Hyperlipidemia Sister   . Other      no FH colon cancer  . Diabetes Mother   . Diabetes Father     History   Social History  . Marital Status: Married    Spouse Name: N/A    Number of Children: N/A  . Years of Education: N/A   Occupational History  . Not on file.   Social History Main Topics  . Smoking status: Never Smoker   . Smokeless tobacco: Never Used  . Alcohol Use: No  . Drug  Use: No  . Sexually Active: Not on file   Other Topics Concern  . Not on file   Social History Narrative   Married- 40 yearsNever SmokedAlcohol use-noDrug use-noDaily Caffeine UseOccupation: housewifeCaffeine use/day:  NoneDoes Patient Exercise:  yes    Current Outpatient Prescriptions on File Prior to Visit  Medication Sig Dispense Refill  . albuterol (PROAIR HFA) 108 (90 BASE) MCG/ACT inhaler Inhale 2 puffs into the lungs 2 (two) times daily as needed.  3 Inhaler  2  . amLODipine (NORVASC) 10 MG tablet Take 1 tablet (10 mg total) by mouth daily.  90 tablet  2  . aspirin 81 MG tablet Take 81 mg by mouth daily.        Marland Kitchen atorvastatin (LIPITOR) 20 MG tablet Take 0.5 tablets (10 mg total) by mouth daily.  90 tablet  2  . b complex vitamins capsule Take 1 capsule by mouth daily.        . benazepril (LOTENSIN) 40 MG tablet Take 1 tablet (40 mg total) by mouth daily.  90 tablet  2  . budesonide-formoterol (SYMBICORT) 160-4.5 MCG/ACT inhaler Inhale 2 puffs into the lungs 2 (two) times daily.  3 Inhaler  2  . chlorpheniramine-HYDROcodone (TUSSIONEX PENNKINETIC ER) 10-8 MG/5ML LQCR Take 5 mLs by mouth every 12 (twelve) hours as needed.  140 mL  0  . furosemide (LASIX) 20 MG tablet Take 1 tablet (20 mg total) by mouth daily.  90 tablet  2  . meloxicam (MOBIC) 15 MG tablet Take 15 mg by mouth daily.      . Multiple Vitamin (MULTIVITAMIN) tablet Take 1 tablet by mouth daily.      . pantoprazole (PROTONIX) 40 MG tablet Take 1 tablet (40 mg total) by mouth daily.  90 tablet  1  . pentoxifylline (TRENTAL) 400 MG CR tablet Take 400 mg by mouth 3 (three) times daily with meals.        . traMADol (ULTRAM) 50 MG tablet Take 50 mg by mouth every 6 (six) hours as needed.        . triamcinolone (KENALOG) 0.1 % cream Apply 1 application topically. Apply topically to affected area once daily       . Wheat Dextrin (BENEFIBER) TABS Take by mouth daily.        . nebivolol 20 MG TABS One by mouth daily  90 tablet  2     Allergies  Allergen Reactions  . Shellfish Allergy Hives, Shortness Of Breath and Swelling  . Iodine     Rash  . Penicillins     REACTION: unspecified    Review of Systems  Review of Systems  Constitutional: Negative for fever and malaise/fatigue.  HENT: Negative for congestion.   Eyes: Negative for discharge.  Respiratory: Negative for shortness of breath.   Cardiovascular: Negative for chest pain, palpitations and leg swelling.  Gastrointestinal: Negative for nausea, abdominal pain and diarrhea.  Genitourinary: Negative for dysuria.  Musculoskeletal: Positive for back pain. Negative for falls.  Skin: Negative for rash.  Neurological: Negative for loss of consciousness and headaches.  Endo/Heme/Allergies: Negative for polydipsia.  Psychiatric/Behavioral: Negative for depression and suicidal ideas. The patient is not nervous/anxious and does not have insomnia.     Objective  BP 142/92  Pulse 78  Temp 97.7 F (36.5 C) (Temporal)  Ht 5\' 2"  (1.575 m)  Wt 250 lb 1.3 oz (113.436 kg)  BMI 45.74 kg/m2  SpO2 97%  Physical Exam  Physical Exam  Constitutional: She is oriented to person, place, and time and well-developed, well-nourished, and in no distress. No distress.  HENT:  Head: Normocephalic and atraumatic.  Eyes: Conjunctivae normal are normal.  Neck: Neck supple. No thyromegaly present.  Cardiovascular: Normal rate, regular rhythm and normal heart sounds.   No murmur heard. Pulmonary/Chest: Effort normal and breath sounds normal. She has no wheezes.  Abdominal: She exhibits no distension and no mass.  Musculoskeletal: She exhibits no edema.  Lymphadenopathy:    She has no cervical adenopathy.  Neurological: She is alert and oriented to person, place, and time.  Skin: Skin is warm and dry. No rash noted. She is not diaphoretic.  Psychiatric: Memory, affect and judgment normal.    Lab Results  Component Value Date   TSH 3.985 09/15/2010   Lab Results    Component Value Date   WBC 4.5 02/01/2012   HGB 12.5 02/01/2012   HCT 40.4 02/01/2012   MCV 66.4* 02/01/2012   PLT 239 02/01/2012   Lab Results  Component Value Date   CREATININE 0.72 08/14/2012   BUN 13 08/14/2012   NA 141 08/14/2012   K 4.4 08/14/2012   CL 106 08/14/2012  CO2 28 08/14/2012   Lab Results  Component Value Date   ALT 25 05/19/2012   AST 17 05/19/2012   ALKPHOS 80 05/19/2012   BILITOT 0.3 05/19/2012   Lab Results  Component Value Date   CHOL 155 05/19/2012   Lab Results  Component Value Date   HDL 46 05/19/2012   Lab Results  Component Value Date   LDLCALC 87 05/19/2012   Lab Results  Component Value Date   TRIG 111 05/19/2012   Lab Results  Component Value Date   CHOLHDL 3.4 05/19/2012     Assessment & Plan  HYPERTENSION BP improved on recheck. Patient was late getting here secondary to getting lost. Will not change medications today. Of note she did not take her amlodipine and Asacol in the way up the door this morning. She is encouraged to make sure she takes her meds prior to next visit so we may further evaluate  Obstructive sleep apnea Uses her CPAP routinely and says it helps her sleep well  OBESITY, UNSPECIFIED She has been exercising well and reports a last visit. Encouraged DASH diet and ongoing exercise  Diabetes mellitus type 2, controlled HGBA1c improved to 6.5. Patient encouraged to continue avoiding simple carbs and we will continue to monitor.  Hyperlipidemia Improved control, continue Atorvastatin  Low back pain radiating to right leg Patient requesting referral to new ortho office in the [place] area. She has previously seen ortho in [place]. She is advised to pick a copy of all imaging she has done in past prior to appt. Referral placed

## 2012-08-18 NOTE — Assessment & Plan Note (Signed)
HGBA1c improved to 6.5. Patient encouraged to continue avoiding simple carbs and we will continue to monitor.

## 2012-10-14 ENCOUNTER — Emergency Department (HOSPITAL_BASED_OUTPATIENT_CLINIC_OR_DEPARTMENT_OTHER)
Admission: EM | Admit: 2012-10-14 | Discharge: 2012-10-14 | Disposition: A | Discharge status: Home / Self Care | Payer: Medicare Other | Attending: Emergency Medicine | Admitting: Emergency Medicine

## 2012-10-14 ENCOUNTER — Encounter (HOSPITAL_BASED_OUTPATIENT_CLINIC_OR_DEPARTMENT_OTHER): Payer: Self-pay | Admitting: *Deleted

## 2012-10-14 DIAGNOSIS — Z79899 Other long term (current) drug therapy: Secondary | ICD-10-CM | POA: Insufficient documentation

## 2012-10-14 DIAGNOSIS — I10 Essential (primary) hypertension: Secondary | ICD-10-CM | POA: Insufficient documentation

## 2012-10-14 DIAGNOSIS — K219 Gastro-esophageal reflux disease without esophagitis: Secondary | ICD-10-CM | POA: Insufficient documentation

## 2012-10-14 DIAGNOSIS — R1110 Vomiting, unspecified: Secondary | ICD-10-CM | POA: Insufficient documentation

## 2012-10-14 DIAGNOSIS — J45909 Unspecified asthma, uncomplicated: Secondary | ICD-10-CM | POA: Insufficient documentation

## 2012-10-14 DIAGNOSIS — F411 Generalized anxiety disorder: Secondary | ICD-10-CM | POA: Insufficient documentation

## 2012-10-14 DIAGNOSIS — Z860100 Personal history of colon polyps, unspecified: Secondary | ICD-10-CM | POA: Insufficient documentation

## 2012-10-14 DIAGNOSIS — E7800 Pure hypercholesterolemia, unspecified: Secondary | ICD-10-CM | POA: Insufficient documentation

## 2012-10-14 DIAGNOSIS — E669 Obesity, unspecified: Secondary | ICD-10-CM | POA: Insufficient documentation

## 2012-10-14 DIAGNOSIS — F3289 Other specified depressive episodes: Secondary | ICD-10-CM | POA: Insufficient documentation

## 2012-10-14 DIAGNOSIS — Z7982 Long term (current) use of aspirin: Secondary | ICD-10-CM | POA: Insufficient documentation

## 2012-10-14 DIAGNOSIS — R42 Dizziness and giddiness: Secondary | ICD-10-CM | POA: Insufficient documentation

## 2012-10-14 DIAGNOSIS — M129 Arthropathy, unspecified: Secondary | ICD-10-CM | POA: Insufficient documentation

## 2012-10-14 DIAGNOSIS — Z8719 Personal history of other diseases of the digestive system: Secondary | ICD-10-CM | POA: Insufficient documentation

## 2012-10-14 DIAGNOSIS — F329 Major depressive disorder, single episode, unspecified: Secondary | ICD-10-CM | POA: Insufficient documentation

## 2012-10-14 HISTORY — DX: Anxiety disorder, unspecified: F41.9

## 2012-10-14 LAB — COMPREHENSIVE METABOLIC PANEL
ALT: 25 U/L (ref 0–35)
AST: 23 U/L (ref 0–37)
Albumin: 3.8 g/dL (ref 3.5–5.2)
Calcium: 9.3 mg/dL (ref 8.4–10.5)
GFR calc Af Amer: >90 mL/min (ref >90)
Glucose, Bld: 120 mg/dL — ABNORMAL HIGH (ref 70–99)
Sodium: 142 mEq/L (ref 135–145)
Total Protein: 7.8 g/dL (ref 6.0–8.3)

## 2012-10-14 LAB — CBC WITH DIFFERENTIAL/PLATELET
Basophils Absolute: 0.1 10*3/uL (ref 0.0–0.1)
Eosinophils Absolute: 0 10*3/uL (ref 0.0–0.7)
Lymphocytes Relative: 22 % (ref 12–46)
MCHC: 32.4 g/dL (ref 30.0–36.0)
Monocytes Relative: 3 % (ref 3–12)
Neutrophils Relative %: 74 % (ref 43–77)
Platelets: 222 10*3/uL (ref 150–400)
RDW: 18.2 % — ABNORMAL HIGH (ref 11.5–15.5)
WBC: 6.4 10*3/uL (ref 4.0–10.5)

## 2012-10-14 LAB — URINE MICROSCOPIC-ADD ON

## 2012-10-14 LAB — URINALYSIS, ROUTINE W REFLEX MICROSCOPIC
Glucose, UA: NEGATIVE mg/dL
Hgb urine dipstick: NEGATIVE
Specific Gravity, Urine: 1.021 (ref 1.005–1.030)
Urobilinogen, UA: 1 mg/dL (ref 0.0–1.0)
pH: 6.5 (ref 5.0–8.0)

## 2012-10-14 MED ORDER — MECLIZINE HCL 25 MG PO TABS
25.0000 mg | ORAL_TABLET | Freq: Once | ORAL | Status: AC
  Administered 2012-10-14: 25 mg via ORAL
  Filled 2012-10-14: qty 1

## 2012-10-14 MED ORDER — SODIUM CHLORIDE 0.9 % IV SOLN
Freq: Once | INTRAVENOUS | Status: AC
  Administered 2012-10-14: 13:00:00 via INTRAVENOUS

## 2012-10-14 MED ORDER — ONDANSETRON HCL 4 MG/2ML IJ SOLN
4.0000 mg | Freq: Once | INTRAMUSCULAR | Status: AC
  Administered 2012-10-14: 4 mg via INTRAVENOUS
  Filled 2012-10-14: qty 2

## 2012-10-14 MED ORDER — ONDANSETRON 4 MG PO TBDP: 4.0000 mg | ORAL_TABLET | Freq: Three times a day (TID) | ORAL | Status: DC | PRN

## 2012-10-14 MED ORDER — MECLIZINE HCL 25 MG PO TABS: 25.0000 mg | ORAL_TABLET | Freq: Four times a day (QID) | ORAL | Status: DC

## 2012-10-14 NOTE — ED Provider Notes (Signed)
Medical screening examination/treatment/procedure(s) were conducted as a shared visit with non-physician practitioner(s) and myself.  I personally evaluated the patient during the encounter 62 yo woman with new onset of dizziness, nausea and vomiting.  Exam shows accentuation of her dizziness when her head is turned to the left.  Lab workup negative.  Rx Antivert.  Carleene Cooper III, MD 10/14/12 [phone]

## 2012-10-14 NOTE — ED Provider Notes (Signed)
History     CSN: [id number]  Arrival date & time 10/14/12  1032   First MD Initiated Contact with Patient 10/14/12 1213      Chief Complaint  Patient presents with  . Dizziness    (Consider location/radiation/quality/duration/timing/severity/associated sxs/prior treatment) Patient is a 62 y.o. female presenting with vomiting. The history is provided by the patient. No language interpreter was used.  Emesis Severity:  Moderate Duration:  1 day Timing:  Constant Able to tolerate:  Liquids Progression:  Worsening Chronicity:  New Recent urination:  Normal Relieved by:  Nothing Associated symptoms: no abdominal pain, no diarrhea, no fever and no headaches   Pt complains of nausea and vomiting.  Pt reports she feels dizzy, worsen when she lays down.    Past Medical History  Diagnosis Date  . Asthma   . Hypertension   . Obesity   . Hx of colonic polyps   . GERD (gastroesophageal reflux disease)   . History of colonic diverticulitis   . Arthritis     back  . Hyperlipidemia   . Depression   . Low back pain radiating to right leg 08/18/2012  . Anxiety     Past Surgical History  Procedure Laterality Date  . Cholecystectomy  1991  . Dilation and curettage of uterus    . Tubal ligation    . Appendectomy      Family History  Problem Relation Age of Onset  . Heart disease Maternal Aunt     x 3 -2 brothers  . Hypertension Maternal Aunt   . Cancer Maternal Aunt     2 maternal aunts  . Prostate cancer Father   . Hyperlipidemia Brother   . Hyperlipidemia Sister   . Other      no FH colon cancer  . Diabetes Mother   . Diabetes Father     History  Substance Use Topics  . Smoking status: Never Smoker   . Smokeless tobacco: Never Used  . Alcohol Use: No    OB History   Grav Para Term Preterm Abortions TAB SAB Ect Mult Living                  Review of Systems  Gastrointestinal: Positive for vomiting. Negative for abdominal pain and diarrhea.  Neurological:  Negative for headaches.  All other systems reviewed and are negative.    Allergies  Shellfish allergy; Iodine; and Penicillins  Home Medications   Current Outpatient Rx  Name  Route  Sig  Dispense  Refill  . albuterol (PROAIR HFA) 108 (90 BASE) MCG/ACT inhaler   Inhalation   Inhale 2 puffs into the lungs 2 (two) times daily as needed.   3 Inhaler   2   . amLODipine (NORVASC) 10 MG tablet   Oral   Take 1 tablet (10 mg total) by mouth daily.   90 tablet   2   . aspirin 81 MG tablet   Oral   Take 81 mg by mouth daily.           Marland Kitchen atorvastatin (LIPITOR) 20 MG tablet   Oral   Take 0.5 tablets (10 mg total) by mouth daily.   90 tablet   2   . b complex vitamins capsule   Oral   Take 1 capsule by mouth daily.           . benazepril (LOTENSIN) 40 MG tablet   Oral   Take 1 tablet (40 mg total) by mouth daily.  90 tablet   2   . budesonide-formoterol (SYMBICORT) 160-4.5 MCG/ACT inhaler   Inhalation   Inhale 2 puffs into the lungs 2 (two) times daily.   3 Inhaler   2   . chlorpheniramine-HYDROcodone (TUSSIONEX PENNKINETIC ER) 10-8 MG/5ML LQCR   Oral   Take 5 mLs by mouth every 12 (twelve) hours as needed.   140 mL   0   . furosemide (LASIX) 20 MG tablet   Oral   Take 1 tablet (20 mg total) by mouth daily.   90 tablet   2   . meloxicam (MOBIC) 15 MG tablet   Oral   Take 15 mg by mouth daily.         . Multiple Vitamin (MULTIVITAMIN) tablet   Oral   Take 1 tablet by mouth daily.         Marland Kitchen EXPIRED: nebivolol 20 MG TABS      One by mouth daily   90 tablet   2   . pantoprazole (PROTONIX) 40 MG tablet   Oral   Take 1 tablet (40 mg total) by mouth daily.   90 tablet   1   . pentoxifylline (TRENTAL) 400 MG CR tablet   Oral   Take 400 mg by mouth 3 (three) times daily with meals.           . traMADol (ULTRAM) 50 MG tablet   Oral   Take 50 mg by mouth every 6 (six) hours as needed.           . triamcinolone (KENALOG) 0.1 % cream    Topical   Apply 1 application topically. Apply topically to affected area once daily          . Wheat Dextrin (BENEFIBER) TABS   Oral   Take by mouth daily.             BP 157/96  Pulse 82  Temp(Src) 98.5 F (36.9 C) (Oral)  Resp 18  SpO2 99%  Physical Exam  Nursing note and vitals reviewed. Constitutional: She is oriented to person, place, and time. She appears well-developed and well-nourished.  HENT:  Head: Normocephalic.  Right Ear: External ear normal.  Eyes: Pupils are equal, round, and reactive to light.  Neck: Normal range of motion.  Cardiovascular: Normal rate.   Pulmonary/Chest: Effort normal.  Abdominal: Soft. Bowel sounds are normal.  Musculoskeletal: Normal range of motion.  Neurological: She is alert and oriented to person, place, and time. She has normal reflexes.  Skin: Skin is warm.  Psychiatric: She has a normal mood and affect.    ED Course  Procedures (including critical care time)  Labs Reviewed  CBC WITH DIFFERENTIAL - Abnormal; Notable for the following:    RBC 6.28 (*)    MCV 65.0 (*)    MCH 21.0 (*)    RDW 18.2 (*)    All other components within normal limits  URINALYSIS, ROUTINE W REFLEX MICROSCOPIC - Abnormal; Notable for the following:    APPearance CLOUDY (*)    Protein, ur 30 (*)    All other components within normal limits  URINE MICROSCOPIC-ADD ON - Abnormal; Notable for the following:    Squamous Epithelial / LPF FEW (*)    Bacteria, UA FEW (*)    All other components within normal limits  COMPREHENSIVE METABOLIC PANEL   No results found.   No diagnosis found.    MDM   Results for orders placed during the hospital encounter of 10/14/12  CBC WITH DIFFERENTIAL      Result Value Range   WBC 6.4  4.0 - 10.5 K/uL   RBC 6.28 (*) 3.87 - 5.11 MIL/uL   Hemoglobin 13.2  12.0 - 15.0 g/dL   HCT 16.1  09.6 - 04.5 %   MCV 65.0 (*) 78.0 - 100.0 fL   MCH 21.0 (*) 26.0 - 34.0 pg   MCHC 32.4  30.0 - 36.0 g/dL   RDW 40.9 (*)  81.1 - 15.5 %   Platelets 222  150 - 400 K/uL   Neutrophils Relative 74  43 - 77 %   Lymphocytes Relative 22  12 - 46 %   Monocytes Relative 3  3 - 12 %   Eosinophils Relative 0  0 - 5 %   Basophils Relative 1  0 - 1 %   Neutro Abs 4.7  1.7 - 7.7 K/uL   Lymphs Abs 1.4  0.7 - 4.0 K/uL   Monocytes Absolute 0.2  0.1 - 1.0 K/uL   Eosinophils Absolute 0.0  0.0 - 0.7 K/uL   Basophils Absolute 0.1  0.0 - 0.1 K/uL   RBC Morphology TARGET CELLS     Smear Review LARGE PLATELETS PRESENT    COMPREHENSIVE METABOLIC PANEL      Result Value Range   Sodium 142  135 - 145 mEq/L   Potassium 3.6  3.5 - 5.1 mEq/L   Chloride 104  96 - 112 mEq/L   CO2 27  19 - 32 mEq/L   Glucose, Bld 120 (*) 70 - 99 mg/dL   BUN 12  6 - 23 mg/dL   Creatinine, Ser 9.14  0.50 - 1.10 mg/dL   Calcium 9.3  8.4 - 78.2 mg/dL   Total Protein 7.8  6.0 - 8.3 g/dL   Albumin 3.8  3.5 - 5.2 g/dL   AST 23  0 - 37 U/L   ALT 25  0 - 35 U/L   Alkaline Phosphatase 106  39 - 117 U/L   Total Bilirubin 0.3  0.3 - 1.2 mg/dL   GFR calc non Af Amer >90  >90 mL/min   GFR calc Af Amer >90  >90 mL/min  URINALYSIS, ROUTINE W REFLEX MICROSCOPIC      Result Value Range   Color, Urine YELLOW  YELLOW   APPearance CLOUDY (*) CLEAR   Specific Gravity, Urine 1.021  1.005 - 1.030   pH 6.5  5.0 - 8.0   Glucose, UA NEGATIVE  NEGATIVE mg/dL   Hgb urine dipstick NEGATIVE  NEGATIVE   Bilirubin Urine NEGATIVE  NEGATIVE   Ketones, ur NEGATIVE  NEGATIVE mg/dL   Protein, ur 30 (*) NEGATIVE mg/dL   Urobilinogen, UA 1.0  0.0 - 1.0 mg/dL   Nitrite NEGATIVE  NEGATIVE   Leukocytes, UA NEGATIVE  NEGATIVE  URINE MICROSCOPIC-ADD ON      Result Value Range   Squamous Epithelial / LPF FEW (*) RARE   WBC, UA 0-2  <3 WBC/hpf   RBC / HPF 0-2  <3 RBC/hpf   Bacteria, UA FEW (*) RARE   Urine-Other MUCOUS PRESENT     No results found.  IV ns x 1 liter,  zofran iv and antivert.   Pt reports feeling some better.   Dr. Ignacia Palma in to see and examine  pt       Elson Areas, PA-C 10/14/12 1526

## 2012-10-14 NOTE — ED Provider Notes (Signed)
12:49 PM  Date: 10/14/2012  Rate:64  Rhythm: normal sinus rhythm  QRS Axis: normal  Intervals: normal  ST/T Wave abnormalities: nonspecific T wave changes  Conduction Disutrbances:  Incomplete right bundle branch block.  Narrative Interpretation: Abnormal EKG  Old EKG Reviewed: none available    Carleene Cooper III, MD 10/14/12 1252

## 2012-10-14 NOTE — ED Notes (Signed)
Patient states she woke up this morning at 0615 with dizziness.  States she attempted to drink some juice which caused some nausea and vomiting.  States it feels like the room is spinning.  Denies pain.

## 2012-10-21 LAB — HM PAP SMEAR: HM Pap smear: NORMAL

## 2012-10-23 ENCOUNTER — Ambulatory Visit: Payer: Medicare Other | Admitting: Family Medicine

## 2012-10-27 ENCOUNTER — Ambulatory Visit: Payer: Medicare Other | Admitting: Family

## 2012-10-27 ENCOUNTER — Ambulatory Visit (INDEPENDENT_AMBULATORY_CARE_PROVIDER_SITE_OTHER): Payer: Medicare Other | Admitting: Family Medicine

## 2012-10-27 ENCOUNTER — Encounter: Payer: Self-pay | Admitting: Family Medicine

## 2012-10-27 VITALS — BP 136/86 | HR 66 | Temp 98.0°F | Ht 62.0 in | Wt 248.1 lb

## 2012-10-27 DIAGNOSIS — I10 Essential (primary) hypertension: Secondary | ICD-10-CM

## 2012-10-27 DIAGNOSIS — H8110 Benign paroxysmal vertigo, unspecified ear: Secondary | ICD-10-CM

## 2012-10-27 DIAGNOSIS — J3489 Other specified disorders of nose and nasal sinuses: Secondary | ICD-10-CM

## 2012-10-27 DIAGNOSIS — R0981 Nasal congestion: Secondary | ICD-10-CM

## 2012-10-27 DIAGNOSIS — E119 Type 2 diabetes mellitus without complications: Secondary | ICD-10-CM

## 2012-10-27 MED ORDER — GUAIFENESIN ER 600 MG PO TB12: 600.0000 mg | ORAL_TABLET | Freq: Two times a day (BID) | ORAL | Status: DC

## 2012-10-27 MED ORDER — FLUTICASONE PROPIONATE 50 MCG/ACT NA SUSP: 2.0000 | Freq: Every day | NASAL | Status: DC

## 2012-10-27 NOTE — Patient Instructions (Addendum)
Digestive Advantage probiotics by [employer] Krill oil caps can switch from Fish oil, MegaRed by [employer]   Vertigo Vertigo means you feel like you or your surroundings are moving when they are not. Vertigo can be dangerous if it occurs when you are at work, driving, or performing difficult activities.  CAUSES  Vertigo occurs when there is a conflict of signals sent to your brain from the visual and sensory systems in your body. There are many different causes of vertigo, including:  Infections, especially in the inner ear.  A bad reaction to a drug or misuse of alcohol and medicines.  Withdrawal from drugs or alcohol.  Rapidly changing positions, such as lying down or rolling over in bed.  A migraine headache.  Decreased blood flow to the brain.  Increased pressure in the brain from a head injury, infection, tumor, or bleeding. SYMPTOMS  You may feel as though the world is spinning around or you are falling to the ground. Because your balance is upset, vertigo can cause nausea and vomiting. You may have involuntary eye movements (nystagmus). DIAGNOSIS  Vertigo is usually diagnosed by physical exam. If the cause of your vertigo is unknown, your caregiver may perform imaging tests, such as an MRI scan (magnetic resonance imaging). TREATMENT  Most cases of vertigo resolve on their own, without treatment. Depending on the cause, your caregiver may prescribe certain medicines. If your vertigo is related to body position issues, your caregiver may recommend movements or procedures to correct the problem. In rare cases, if your vertigo is caused by certain inner ear problems, you may need surgery. HOME CARE INSTRUCTIONS   Follow your caregiver's instructions.  Avoid driving.  Avoid operating heavy machinery.  Avoid performing any tasks that would be dangerous to you or others during a vertigo episode.  Tell your caregiver if you notice that certain medicines seem to be causing your vertigo.  Some of the medicines used to treat vertigo episodes can actually make them worse in some people. SEEK IMMEDIATE MEDICAL CARE IF:   Your medicines do not relieve your vertigo or are making it worse.  You develop problems with talking, walking, weakness, or using your arms, hands, or legs.  You develop severe headaches.  Your nausea or vomiting continues or gets worse.  You develop visual changes.  A family member notices behavioral changes.  Your condition gets worse. MAKE SURE YOU:  Understand these instructions.  Will watch your condition.  Will get help right away if you are not doing well or get worse. Document Released: 04/18/2005 Document Revised: 10/01/2011 Document Reviewed: 01/25/2011 [facility] Patient Information 2013 [place], [place].

## 2012-11-01 ENCOUNTER — Encounter: Payer: Self-pay | Admitting: Family Medicine

## 2012-11-01 DIAGNOSIS — H8110 Benign paroxysmal vertigo, unspecified ear: Secondary | ICD-10-CM | POA: Insufficient documentation

## 2012-11-01 NOTE — Assessment & Plan Note (Signed)
hgba1c stable, encouraged decreased simple carb intake.

## 2012-11-01 NOTE — Assessment & Plan Note (Signed)
Well controlled, no changes 

## 2012-11-01 NOTE — Progress Notes (Signed)
Patient ID: Katelyn Fisher, female   DOB: 03/04/1951, 61 y.o.   MRN: [id number] Katelyn Fisher [id number] 1950/10/02 11/01/2012      Progress Note-Follow Up  Subjective  Chief Complaint  Chief Complaint  Patient presents with  . Follow-up    hospital    HPI  Patient is a 62 year old female who is in today for hospital followup. She presented to the ED with vertiginous symptoms. She notes that she turned her head to the left especially quickly she had a spinning sensation. No vomiting but some nausea was associated. She did have some mild fullness and discomfort in her left ear as well. No tinnitus or hearing loss. Has had some nasal congestion and irritation but no green rhinorrhea , cough, sore throat, headache or other neurologic complaints noted. Denies chest pain or palpitations no shortness of breath GI or GU concerns given meclizine by the ED and did find that helpful but is now out of the medication. Symptoms are greatly improved and she's not having any spinning sensation at the present time just an occasional sense of congestion and lightheadedness  Past Medical History  Diagnosis Date  . Asthma   . Hypertension   . Obesity   . Hx of colonic polyps   . GERD (gastroesophageal reflux disease)   . History of colonic diverticulitis   . Arthritis     back  . Hyperlipidemia   . Depression   . Low back pain radiating to right leg 08/18/2012  . Anxiety   . Benign paroxysmal positional vertigo 11/01/2012    Past Surgical History  Procedure Laterality Date  . Cholecystectomy  1991  . Dilation and curettage of uterus    . Tubal ligation    . Appendectomy      Family History  Problem Relation Age of Onset  . Heart disease Maternal Aunt     x 3 -2 brothers  . Hypertension Maternal Aunt   . Cancer Maternal Aunt     2 maternal aunts  . Prostate cancer Father   . Hyperlipidemia Brother   . Hyperlipidemia Sister   . Other      no FH colon cancer  . Diabetes Mother   .  Diabetes Father     History   Social History  . Marital Status: Married    Spouse Name: N/A    Number of Children: N/A  . Years of Education: N/A   Occupational History  . Not on file.   Social History Main Topics  . Smoking status: Never Smoker   . Smokeless tobacco: Never Used  . Alcohol Use: No  . Drug Use: No  . Sexually Active: Not on file   Other Topics Concern  . Not on file   Social History Narrative   Married- 40 years   Never Smoked   Alcohol use-no   Drug use-no   Daily Caffeine Use   Occupation: housewife   Caffeine use/day:  None   Does Patient Exercise:  yes    Current Outpatient Prescriptions on File Prior to Visit  Medication Sig Dispense Refill  . albuterol (PROAIR HFA) 108 (90 BASE) MCG/ACT inhaler Inhale 2 puffs into the lungs 2 (two) times daily as needed.  3 Inhaler  2  . amLODipine (NORVASC) 10 MG tablet Take 1 tablet (10 mg total) by mouth daily.  90 tablet  2  . aspirin 81 MG tablet Take 81 mg by mouth daily.        Marland Kitchen  atorvastatin (LIPITOR) 20 MG tablet Take 0.5 tablets (10 mg total) by mouth daily.  90 tablet  2  . b complex vitamins capsule Take 1 capsule by mouth daily.        . benazepril (LOTENSIN) 40 MG tablet Take 1 tablet (40 mg total) by mouth daily.  90 tablet  2  . budesonide-formoterol (SYMBICORT) 160-4.5 MCG/ACT inhaler Inhale 2 puffs into the lungs 2 (two) times daily.  3 Inhaler  2  . chlorpheniramine-HYDROcodone (TUSSIONEX PENNKINETIC ER) 10-8 MG/5ML LQCR Take 5 mLs by mouth every 12 (twelve) hours as needed.  140 mL  0  . furosemide (LASIX) 20 MG tablet Take 1 tablet (20 mg total) by mouth daily.  90 tablet  2  . meloxicam (MOBIC) 15 MG tablet Take 15 mg by mouth daily.      . Multiple Vitamin (MULTIVITAMIN) tablet Take 1 tablet by mouth daily.      . pantoprazole (PROTONIX) 40 MG tablet Take 1 tablet (40 mg total) by mouth daily.  90 tablet  1  . pentoxifylline (TRENTAL) 400 MG CR tablet Take 400 mg by mouth 3 (three) times  daily with meals.        . traMADol (ULTRAM) 50 MG tablet Take 50 mg by mouth every 6 (six) hours as needed.        . triamcinolone (KENALOG) 0.1 % cream Apply 1 application topically. Apply topically to affected area once daily       . Wheat Dextrin (BENEFIBER) TABS Take by mouth daily.        . nebivolol 20 MG TABS One by mouth daily  90 tablet  2   No current facility-administered medications on file prior to visit.    Allergies  Allergen Reactions  . Shellfish Allergy Hives, Shortness Of Breath and Swelling  . Iodine     Rash  . Penicillins     REACTION: unspecified    Review of Systems  Review of Systems  Constitutional: Negative for fever and malaise/fatigue.  HENT: Positive for congestion. Negative for hearing loss, nosebleeds, tinnitus and ear discharge.   Eyes: Negative for discharge.  Respiratory: Positive for sputum production. Negative for shortness of breath.   Cardiovascular: Negative for chest pain, palpitations and leg swelling.  Gastrointestinal: Negative for nausea, abdominal pain and diarrhea.  Genitourinary: Negative for dysuria.  Musculoskeletal: Negative for falls.  Skin: Negative for rash.  Neurological: Positive for dizziness. Negative for loss of consciousness and headaches.  Endo/Heme/Allergies: Negative for polydipsia.  Psychiatric/Behavioral: Negative for depression and suicidal ideas. The patient is not nervous/anxious and does not have insomnia.     Objective  BP 136/86  Pulse 66  Temp(Src) 98 F (36.7 C) (Oral)  Ht 5\' 2"  (1.575 m)  Wt 248 lb 1.9 oz (112.546 kg)  BMI 45.37 kg/m2  SpO2 94%  Physical Exam Physical Exam  Constitutional: She is oriented to person, place, and time and well-developed, well-nourished, and in no distress. No distress.  HENT:  Head: Normocephalic and atraumatic.  TMs dull and retracted  Eyes: Conjunctivae are normal.  Neck: Neck supple. No thyromegaly present.  Cardiovascular: Normal rate, regular rhythm and  normal heart sounds.   No murmur heard. Pulmonary/Chest: Effort normal and breath sounds normal. She has no wheezes.  Abdominal: She exhibits no distension and no mass.  Musculoskeletal: She exhibits no edema.  Lymphadenopathy:    She has no cervical adenopathy.  Neurological: She is alert and oriented to person, place, and time.  Skin:  Skin is warm and dry. No rash noted. She is not diaphoretic.  Psychiatric: Memory, affect and judgment normal.    Lab Results  Component Value Date   TSH 3.985 09/15/2010   Lab Results  Component Value Date   WBC 6.4 10/14/2012   HGB 13.2 10/14/2012   HCT 40.8 10/14/2012   MCV 65.0* 10/14/2012   PLT 222 10/14/2012   Lab Results  Component Value Date   CREATININE 0.70 10/14/2012   BUN 12 10/14/2012   NA 142 10/14/2012   K 3.6 10/14/2012   CL 104 10/14/2012   CO2 27 10/14/2012   Lab Results  Component Value Date   ALT 25 10/14/2012   AST 23 10/14/2012   ALKPHOS 106 10/14/2012   BILITOT 0.3 10/14/2012   Lab Results  Component Value Date   CHOL 155 05/19/2012   Lab Results  Component Value Date   HDL 46 05/19/2012   Lab Results  Component Value Date   LDLCALC 87 05/19/2012   Lab Results  Component Value Date   TRIG 111 05/19/2012   Lab Results  Component Value Date   CHOLHDL 3.4 05/19/2012     Assessment & Plan  HYPERTENSION Well controlled, no changes.  Benign paroxysmal positional vertigo Recently presented to ED with symptoms, is greatly improved and did get relief from Meclizine, given refill today. Encouraged increased hydration, asked to treat her allergies more aggressively and report worsening symptoms for further consideration.   Diabetes mellitus type 2, controlled hgba1c stable, encouraged decreased simple carb intake.

## 2012-11-01 NOTE — Assessment & Plan Note (Addendum)
Recently presented to ED with symptoms, is greatly improved and did get relief from Meclizine, given refill today. Encouraged increased hydration, asked to treat her allergies more aggressively and report worsening symptoms for further consideration.

## 2012-11-02 ENCOUNTER — Encounter: Payer: Self-pay | Admitting: Internal Medicine

## 2012-11-03 NOTE — Telephone Encounter (Signed)
Please advise mychart question? 

## 2012-11-28 ENCOUNTER — Other Ambulatory Visit: Payer: Self-pay | Admitting: Family Medicine

## 2012-11-28 ENCOUNTER — Telehealth: Payer: Self-pay

## 2012-11-28 DIAGNOSIS — D649 Anemia, unspecified: Secondary | ICD-10-CM

## 2012-11-28 DIAGNOSIS — I10 Essential (primary) hypertension: Secondary | ICD-10-CM

## 2012-11-28 DIAGNOSIS — E785 Hyperlipidemia, unspecified: Secondary | ICD-10-CM

## 2012-11-28 DIAGNOSIS — E119 Type 2 diabetes mellitus without complications: Secondary | ICD-10-CM

## 2012-11-28 LAB — HEMOGLOBIN A1C
Hgb A1c MFr Bld: 6.2 % — ABNORMAL HIGH (ref <5.7)
Mean Plasma Glucose: 131 mg/dL — ABNORMAL HIGH (ref <117)

## 2012-11-28 LAB — RENAL FUNCTION PANEL
Calcium: 9.2 mg/dL (ref 8.4–10.5)
Chloride: 107 mEq/L (ref 96–112)
Glucose, Bld: 97 mg/dL (ref 70–99)
Phosphorus: 3.6 mg/dL (ref 2.3–4.6)
Potassium: 4 mEq/L (ref 3.5–5.3)
Sodium: 140 mEq/L (ref 135–145)

## 2012-11-28 LAB — CBC
Hemoglobin: 12.1 g/dL (ref 12.0–15.0)
RBC: 5.88 MIL/uL — ABNORMAL HIGH (ref 3.87–5.11)

## 2012-11-28 LAB — LIPID PANEL: Total CHOL/HDL Ratio: 2.7 Ratio

## 2012-11-28 LAB — HEPATIC FUNCTION PANEL
ALT: 22 U/L (ref 0–35)
Bilirubin, Direct: 0.1 mg/dL (ref 0.0–0.3)
Indirect Bilirubin: 0.2 mg/dL (ref 0.0–0.9)

## 2012-11-28 NOTE — Telephone Encounter (Signed)
Labs ordered.

## 2012-12-02 ENCOUNTER — Ambulatory Visit (INDEPENDENT_AMBULATORY_CARE_PROVIDER_SITE_OTHER): Payer: Medicare Other | Admitting: Family Medicine

## 2012-12-02 ENCOUNTER — Encounter: Payer: Self-pay | Admitting: Family Medicine

## 2012-12-02 VITALS — BP 130/92 | HR 90 | Temp 98.7°F | Ht 62.0 in | Wt 250.0 lb

## 2012-12-02 DIAGNOSIS — T7840XA Allergy, unspecified, initial encounter: Secondary | ICD-10-CM

## 2012-12-02 DIAGNOSIS — I10 Essential (primary) hypertension: Secondary | ICD-10-CM

## 2012-12-02 DIAGNOSIS — E119 Type 2 diabetes mellitus without complications: Secondary | ICD-10-CM

## 2012-12-02 DIAGNOSIS — E785 Hyperlipidemia, unspecified: Secondary | ICD-10-CM

## 2012-12-02 DIAGNOSIS — D649 Anemia, unspecified: Secondary | ICD-10-CM

## 2012-12-02 DIAGNOSIS — J45909 Unspecified asthma, uncomplicated: Secondary | ICD-10-CM

## 2012-12-02 DIAGNOSIS — H8110 Benign paroxysmal vertigo, unspecified ear: Secondary | ICD-10-CM

## 2012-12-02 LAB — T4, FREE: Free T4: 1.13 ng/dL (ref 0.80–1.80)

## 2012-12-02 MED ORDER — MONTELUKAST SODIUM 10 MG PO TABS: 10.0000 mg | ORAL_TABLET | Freq: Every evening | ORAL | Status: DC | PRN

## 2012-12-02 NOTE — Progress Notes (Signed)
Patient ID: Katelyn Fisher, female   DOB: [birth year], 62 y.o.   MRN: [id number] TAKERRA LUPINACCI [id number] 07/18/1951 12/02/2012      Progress Note-Follow Up  Subjective  Chief Complaint  Chief Complaint  Patient presents with  . Follow-up    4 month    HPI  Patient is a 62 year old female who is in today for followup. Generally doing well. Has been struggling with some allergies and a low-grade cough. No fevers or chills. No headache or chest pain. No shortness of breath GI or GU complaints. Patient follows with Dr. Doylene Canning at [facility] dermatology for chronic dermatologic complaints on her legs. Blood sugars have been well-controlled. Taking medications as prescribed.  Past Medical History  Diagnosis Date  . Asthma   . Hypertension   . Obesity   . Hx of colonic polyps   . GERD (gastroesophageal reflux disease)   . History of colonic diverticulitis   . Arthritis     back  . Hyperlipidemia   . Depression   . Low back pain radiating to right leg 08/18/2012  . Anxiety   . Benign paroxysmal positional vertigo 11/01/2012    Past Surgical History  Procedure Laterality Date  . Cholecystectomy  1991  . Dilation and curettage of uterus    . Tubal ligation    . Appendectomy      Family History  Problem Relation Age of Onset  . Heart disease Maternal Aunt     x 3 -2 brothers  . Hypertension Maternal Aunt   . Cancer Maternal Aunt     2 maternal aunts  . Prostate cancer Father   . Hyperlipidemia Brother   . Hyperlipidemia Sister   . Other      no FH colon cancer  . Diabetes Mother   . Diabetes Father     History   Social History  . Marital Status: Married    Spouse Name: N/A    Number of Children: N/A  . Years of Education: N/A   Occupational History  . Not on file.   Social History Main Topics  . Smoking status: Never Smoker   . Smokeless tobacco: Never Used  . Alcohol Use: No  . Drug Use: No  . Sexually Active: Not on file   Other Topics Concern   . Not on file   Social History Narrative   Married- 40 years   Never Smoked   Alcohol use-no   Drug use-no   Daily Caffeine Use   Occupation: housewife   Caffeine use/day:  None   Does Patient Exercise:  yes    Current Outpatient Prescriptions on File Prior to Visit  Medication Sig Dispense Refill  . albuterol (PROAIR HFA) 108 (90 BASE) MCG/ACT inhaler Inhale 2 puffs into the lungs 2 (two) times daily as needed.  3 Inhaler  2  . amLODipine (NORVASC) 10 MG tablet Take 1 tablet (10 mg total) by mouth daily.  90 tablet  2  . aspirin 81 MG tablet Take 81 mg by mouth daily.        Marland Kitchen atorvastatin (LIPITOR) 20 MG tablet Take 0.5 tablets (10 mg total) by mouth daily.  90 tablet  2  . b complex vitamins capsule Take 1 capsule by mouth daily.        . benazepril (LOTENSIN) 40 MG tablet Take 1 tablet (40 mg total) by mouth daily.  90 tablet  2  . budesonide-formoterol (SYMBICORT) 160-4.5 MCG/ACT inhaler Inhale 2 puffs into the  lungs 2 (two) times daily.  3 Inhaler  2  . chlorpheniramine-HYDROcodone (TUSSIONEX PENNKINETIC ER) 10-8 MG/5ML LQCR Take 5 mLs by mouth every 12 (twelve) hours as needed.  140 mL  0  . fluticasone (FLONASE) 50 MCG/ACT nasal spray Place 2 sprays into the nose daily.  16 g  0  . furosemide (LASIX) 20 MG tablet Take 1 tablet (20 mg total) by mouth daily.  90 tablet  2  . meloxicam (MOBIC) 15 MG tablet Take 15 mg by mouth daily.      . Multiple Vitamin (MULTIVITAMIN) tablet Take 1 tablet by mouth daily.      . nebivolol 20 MG TABS One by mouth daily  90 tablet  2  . pantoprazole (PROTONIX) 40 MG tablet Take 1 tablet (40 mg total) by mouth daily.  90 tablet  1  . pentoxifylline (TRENTAL) 400 MG CR tablet Take 400 mg by mouth 3 (three) times daily with meals.        . traMADol (ULTRAM) 50 MG tablet Take 50 mg by mouth every 6 (six) hours as needed.        . triamcinolone (KENALOG) 0.1 % cream Apply 1 application topically. Apply topically to affected area once daily       .  Wheat Dextrin (BENEFIBER) TABS Take by mouth daily.        Marland Kitchen guaiFENesin (MUCINEX) 600 MG 12 hr tablet Take 1 tablet (600 mg total) by mouth 2 (two) times daily. X 10 days  20 tablet  0   No current facility-administered medications on file prior to visit.    Allergies  Allergen Reactions  . Shellfish Allergy Hives, Shortness Of Breath and Swelling  . Iodine     Rash  . Penicillins     REACTION: unspecified    Review of Systems  Review of Systems  Constitutional: Negative for fever and malaise/fatigue.  HENT: Negative for congestion.   Eyes: Negative for discharge.  Respiratory: Negative for shortness of breath.   Cardiovascular: Negative for chest pain, palpitations and leg swelling.  Gastrointestinal: Negative for nausea, abdominal pain and diarrhea.  Genitourinary: Negative for dysuria.  Musculoskeletal: Positive for joint pain. Negative for falls.  Skin: Negative for rash.  Neurological: Negative for loss of consciousness and headaches.  Endo/Heme/Allergies: Negative for polydipsia.  Psychiatric/Behavioral: Negative for depression and suicidal ideas. The patient is not nervous/anxious and does not have insomnia.     Objective  BP 130/92  Pulse 90  Temp(Src) 98.7 F (37.1 C) (Oral)  Ht 5\' 2"  (1.575 m)  Wt 250 lb 0.6 oz (113.417 kg)  BMI 45.72 kg/m2  SpO2 97%  Physical Exam  Physical Exam  Constitutional: She is oriented to person, place, and time and well-developed, well-nourished, and in no distress. No distress.  HENT:  Head: Normocephalic and atraumatic.  Eyes: Conjunctivae are normal.  Neck: Neck supple. No thyromegaly present.  Cardiovascular: Normal rate, regular rhythm and normal heart sounds.   No murmur heard. Pulmonary/Chest: Effort normal and breath sounds normal. She has no wheezes.  Abdominal: She exhibits no distension and no mass.  Musculoskeletal: She exhibits no edema.  Lymphadenopathy:    She has no cervical adenopathy.  Neurological: She  is alert and oriented to person, place, and time.  Skin: Skin is warm and dry. No rash noted. She is not diaphoretic.  Psychiatric: Memory, affect and judgment normal.    Lab Results  Component Value Date   TSH 5.857* 11/28/2012   Lab Results  Component Value Date   WBC 5.0 11/28/2012   HGB 12.1 11/28/2012   HCT 39.2 11/28/2012   MCV 66.7* 11/28/2012   PLT 214 11/28/2012   Lab Results  Component Value Date   CREATININE 0.72 11/28/2012   BUN 10 11/28/2012   NA 140 11/28/2012   K 4.0 11/28/2012   CL 107 11/28/2012   CO2 29 11/28/2012   Lab Results  Component Value Date   ALT 22 11/28/2012   AST 19 11/28/2012   ALKPHOS 99 11/28/2012   BILITOT 0.3 11/28/2012   Lab Results  Component Value Date   CHOL 144 11/28/2012   Lab Results  Component Value Date   HDL 53 11/28/2012   Lab Results  Component Value Date   LDLCALC 78 11/28/2012   Lab Results  Component Value Date   TRIG 65 11/28/2012   Lab Results  Component Value Date   CHOLHDL 2.7 11/28/2012     Assessment & Plan  HYPERTENSION Well controlled, no changes  Benign paroxysmal positional vertigo Recent episode of vertigo improved at this time. Encouraged adequate hydration and report worsening symptoms  Anemia Resolved  Diabetes mellitus type 2, controlled hgba1c is 6.2 minimize simple carbs. Continue current meds  Hyperlipidemia Avoid trans fats, tolerating Lipitor, continue same

## 2012-12-02 NOTE — Patient Instructions (Addendum)
Labs prior to visit, lipid, renal, cbc, tsh hgab1c  Diabetes and Exercise Regular exercise is important and can help:   Control blood glucose (sugar).  Decrease blood pressure.    Control blood lipids (cholesterol, triglycerides).  Improve overall health. BENEFITS FROM EXERCISE  Improved fitness.  Improved flexibility.  Improved endurance.  Increased bone density.  Weight control.  Increased muscle strength.  Decreased body fat.  Improvement of the body's use of insulin, a hormone.  Increased insulin sensitivity.  Reduction of insulin needs.  Reduced stress and tension.  Helps you feel better. People with diabetes who add exercise to their lifestyle gain additional benefits, including:  Weight loss.  Reduced appetite.  Improvement of the body's use of blood glucose.  Decreased risk factors for heart disease:  Lowering of cholesterol and triglycerides.  Raising the level of good cholesterol (high-density lipoproteins, HDL).  Lowering blood sugar.  Decreased blood pressure. TYPE 1 DIABETES AND EXERCISE  Exercise will usually lower your blood glucose.  If blood glucose is greater than 240 mg/dl, check urine ketones. If ketones are present, do not exercise.  Location of the insulin injection sites may need to be adjusted with exercise. Avoid injecting insulin into areas of the body that will be exercised. For example, avoid injecting insulin into:  The arms when playing tennis.  The legs when jogging. For more information, discuss this with your caregiver.  Keep a record of:  Food intake.  Type and amount of exercise.  Expected peak times of insulin action.  Blood glucose levels. Do this before, during, and after exercise. Review your records with your caregiver. This will help you to develop guidelines for adjusting food intake and insulin amounts.  TYPE 2 DIABETES AND EXERCISE  Regular physical activity can help control blood  glucose.  Exercise is important because it may:  Increase the body's sensitivity to insulin.  Improve blood glucose control.  Exercise reduces the risk of heart disease. It decreases serum cholesterol and triglycerides. It also lowers blood pressure.  Those who take insulin or oral hypoglycemic agents should watch for signs of hypoglycemia. These signs include dizziness, shaking, sweating, chills, and confusion.  Body water is lost during exercise. It must be replaced. This will help to avoid loss of body fluids (dehydration) or heat stroke. Be sure to talk to your caregiver before starting an exercise program to make sure it is safe for you. Remember, any activity is better than none.  Document Released: 09/29/2003 Document Revised: 10/01/2011 Document Reviewed: 01/13/2009 [facility] Patient Information 2013 [place], [place].

## 2012-12-05 NOTE — Assessment & Plan Note (Signed)
Recent episode of vertigo improved at this time. Encouraged adequate hydration and report worsening symptoms

## 2012-12-05 NOTE — Assessment & Plan Note (Signed)
hgba1c is 6.2 minimize simple carbs. Continue current meds

## 2012-12-05 NOTE — Assessment & Plan Note (Signed)
Avoid trans fats, tolerating Lipitor, continue same

## 2012-12-05 NOTE — Assessment & Plan Note (Signed)
Well controlled, no changes 

## 2012-12-05 NOTE — Assessment & Plan Note (Signed)
Resolved

## 2013-01-29 ENCOUNTER — Other Ambulatory Visit: Payer: Self-pay

## 2013-02-06 ENCOUNTER — Ambulatory Visit (INDEPENDENT_AMBULATORY_CARE_PROVIDER_SITE_OTHER): Payer: Medicare Other | Admitting: Physician Assistant

## 2013-02-06 ENCOUNTER — Encounter: Payer: Self-pay | Admitting: Physician Assistant

## 2013-02-06 VITALS — BP 128/84 | HR 74 | Temp 97.9°F | Resp 16 | Wt 247.0 lb

## 2013-02-06 DIAGNOSIS — R0981 Nasal congestion: Secondary | ICD-10-CM

## 2013-02-06 DIAGNOSIS — J3489 Other specified disorders of nose and nasal sinuses: Secondary | ICD-10-CM

## 2013-02-06 DIAGNOSIS — H8110 Benign paroxysmal vertigo, unspecified ear: Secondary | ICD-10-CM

## 2013-02-06 DIAGNOSIS — I10 Essential (primary) hypertension: Secondary | ICD-10-CM

## 2013-02-06 MED ORDER — FLUTICASONE PROPIONATE 50 MCG/ACT NA SUSP: 2.0000 | Freq: Every day | NASAL | Status: DC

## 2013-02-06 NOTE — Assessment & Plan Note (Signed)
Patient taking medications as prescribed.  Instructed to be mindful about salt intake as it relates to both her HTN and could add to her aural fullness.  Sodium-controlled diet information given to patient.

## 2013-02-06 NOTE — Progress Notes (Signed)
  Subjective:    Patient ID: Katelyn Fisher, female    DOB: 06/12/1951, 62 y.o.   MRN: [id number]  HPI  Patient is a 62 y.o. female with history of BPPV who presents to the clinic today complaining of dizziness and aural fullness starting 2 days ago.  Information is provided by the patient who seems a reliable historian.  Patient states that this Wednesday she noticed a sensation of pressure in her R ear.  She then began to feel dizzy, nauseas and lightheaded.  Patient states the episode lasted a few hours and was relieved with Meclizine and rest.  She was recently seen in the ED for similar symptoms, diagnosed with BPPV.  She denies ever having experienced these spells prior to her ED visit.  Patient denies any other recent sickness, sick contact, overt ear pain, discharge from the ear, change in hearing or vision, tinnitus, or headache.  Also denies fevers.  Endorses nausea with "spells" but denies any emetic episodes.  Has prescription for Flonase but has not taken it in several months. Endorses taking all anti-HTN medications as prescribed.  BP has been well controlled with medications.  Patient endorses good fluid intake.   Past Medical History  Diagnosis Date  . Asthma   . Hypertension   . Obesity   . Hx of colonic polyps   . GERD (gastroesophageal reflux disease)   . History of colonic diverticulitis   . Arthritis     back  . Hyperlipidemia   . Depression   . Low back pain radiating to right leg 08/18/2012  . Anxiety   . Benign paroxysmal positional vertigo 11/01/2012     Review of Systems  Constitutional: Negative for fever and fatigue.  HENT: Negative for hearing loss, ear pain, congestion, sore throat, rhinorrhea, sinus pressure, tinnitus and ear discharge.        Endorses aural fullness of R ear only  Eyes: Negative for visual disturbance.  Respiratory: Negative for cough and shortness of breath.   All other systems reviewed and are negative.      Objective:   Physical  Exam  Constitutional: She appears well-developed and well-nourished. No distress.  HENT:  Head: Normocephalic and atraumatic.  Right Ear: Tympanic membrane, external ear and ear canal normal.  Left Ear: Tympanic membrane, external ear and ear canal normal.  Lymphadenopathy:    She has no cervical adenopathy.    Labs -- no labs performed for this visit      Assessment & Plan:  (1) Benign Paroxysmal Positional Vertigo -- patient given prescription for Flonase to take daily.  Also encouraged sodium-controlled diet to help with aural fullness.  Advised to try OTC antihistamine and saline nasal spray for pressure.  Return to clinic at next appt. time or sooner if needed. (2) HTN -- BP well-controlled with medications.  Sodium-controlled diet recommended to patient to help with HTN and symptoms of aural fullness.

## 2013-02-06 NOTE — Patient Instructions (Addendum)
Please restart taking your Flonase every day over the next month.  Also, feel free to try an OTC zyrtec or claritin to help alleviate symptoms. Please be mindful of salt intake. I feel fairly confident that this will help to alleviate symptoms.  If symptoms persist or acutely worsen, please call the office or go to the ED.      Benign Positional Vertigo Vertigo means you feel like you or your surroundings are moving when they are not. Benign positional vertigo is the most common form of vertigo. Benign means that the cause of your condition is not serious. Benign positional vertigo is more common in older adults. CAUSES  Benign positional vertigo is the result of an upset in the labyrinth system. This is an area in the middle ear that helps control your balance. This may be caused by a viral infection, head injury, or repetitive motion. However, often no specific cause is found. SYMPTOMS  Symptoms of benign positional vertigo occur when you move your head or eyes in different directions. Some of the symptoms may include:  Loss of balance and falls.  Vomiting.  Blurred vision.  Dizziness.  Nausea.  Involuntary eye movements (nystagmus). DIAGNOSIS  Benign positional vertigo is usually diagnosed by physical exam. If the specific cause of your benign positional vertigo is unknown, your caregiver may perform imaging tests, such as magnetic resonance imaging (MRI) or computed tomography (CT). TREATMENT  Your caregiver may recommend movements or procedures to correct the benign positional vertigo. Medicines such as meclizine, benzodiazepines, and medicines for nausea may be used to treat your symptoms. In rare cases, if your symptoms are caused by certain conditions that affect the inner ear, you may need surgery. HOME CARE INSTRUCTIONS   Follow your caregiver's instructions.  Move slowly. Do not make sudden body or head movements.  Avoid driving.  Avoid operating heavy machinery.  Avoid  performing any tasks that would be dangerous to you or others during a vertigo episode.  Drink enough fluids to keep your urine clear or pale yellow. SEEK IMMEDIATE MEDICAL CARE IF:   You develop problems with walking, weakness, numbness, or using your arms, hands, or legs.  You have difficulty speaking.  You develop severe headaches.  Your nausea or vomiting continues or gets worse.  You develop visual changes.  Your family or friends notice any behavioral changes.  Your condition gets worse.  You have a fever.  You develop a stiff neck or sensitivity to light. MAKE SURE YOU:   Understand these instructions.  Will watch your condition.  Will get help right away if you are not doing well or get worse. Document Released: 04/16/2006 Document Revised: 10/01/2011 Document Reviewed: 03/29/2011 [facility] Patient Information 2014 [place], [place].  Sodium-Controlled Diet Sodium is a mineral. It is found in many foods. Sodium may be found naturally or added during the making of a food. The most common form of sodium is salt, which is made up of sodium and chloride. Reducing your sodium intake involves changing your eating habits. The following guidelines will help you reduce the sodium in your diet:  Stop using the salt shaker.  Use salt sparingly in cooking and baking.  Substitute with sodium-free seasonings and spices.  Do not use a salt substitute (potassium chloride) without your caregiver's permission.  Include a variety of fresh, unprocessed foods in your diet.  Limit the use of processed and convenience foods that are high in sodium. USE THE FOLLOWING FOODS SPARINGLY: Breads/Starches  Commercial bread stuffing,  commercial pancake or waffle mixes, coating mixes. Waffles. Croutons. Prepared (boxed or frozen) potato, rice, or noodle mixes that contain salt or sodium. Salted [place] fries or hash browns. Salted popcorn, breads, crackers, chips, or snack  foods. Vegetables  Vegetables canned with salt or prepared in cream, butter, or cheese sauces. Sauerkraut. Tomato or vegetable juices canned with salt.  Fresh vegetables are allowed if rinsed thoroughly. Fruit  Fruit is okay to eat. Meat and Meat Substitutes  Salted or smoked meats, such as bacon or Canadian bacon, chipped or corned beef, hot dogs, salt pork, luncheon meats, pastrami, ham, or sausage. Canned or smoked fish, poultry, or meat. Processed cheese or cheese spreads, blue or Roquefort cheese. Battered or frozen fish products. Prepared spaghetti sauce. Baked beans. Reuben sandwiches. Salted nuts. Caviar. Milk  Limit buttermilk to 1 cup per week. Soups and Combination Foods  Bouillon cubes, canned or dried soups, broth, consomm. Convenience (frozen or packaged) dinners with more than 600 mg sodium. Pot pies, pizza, Asian food, fast food cheeseburgers, and specialty sandwiches. Desserts and Sweets  Regular (salted) desserts, pie, commercial fruit snack pies, commercial snack cakes, canned puddings.  Eat desserts and sweets in moderation. Fats and Oils  Gravy mixes or canned gravy. No more than 1 to 2 tbs of salad dressing. Chip dips.  Eat fats and oils in moderation. Beverages  See those listed under the vegetables and milk groups. Condiments  Ketchup, mustard, meat sauces, salsa, regular (salted) and lite soy sauce or mustard. Dill pickles, olives, meat tenderizer. Prepared horseradish or pickle relish. Dutch-processed cocoa. Baking powder or baking soda used medicinally. Worcestershire sauce. "Light" salt. Salt substitute, unless approved by your caregiver. Document Released: 12/29/2001 Document Revised: 10/01/2011 Document Reviewed: 08/01/2009 [facility] Patient Information 2014 [place], [place].

## 2013-02-06 NOTE — Assessment & Plan Note (Signed)
Patient instructed to restart Flonase use daily with 2 puffs in each nostril.  Patient also instructed to try saline nasal spray or OTC allergy medications for symptoms.  Return to clinic for next scheduled appt or prn if needed.

## 2013-02-27 ENCOUNTER — Telehealth: Payer: Self-pay | Admitting: *Deleted

## 2013-02-27 ENCOUNTER — Other Ambulatory Visit: Payer: Self-pay | Admitting: Family Medicine

## 2013-02-27 DIAGNOSIS — I10 Essential (primary) hypertension: Secondary | ICD-10-CM

## 2013-02-27 DIAGNOSIS — E785 Hyperlipidemia, unspecified: Secondary | ICD-10-CM

## 2013-02-27 DIAGNOSIS — E119 Type 2 diabetes mellitus without complications: Secondary | ICD-10-CM

## 2013-02-27 LAB — LIPID PANEL
Cholesterol: 143 mg/dL (ref 0–200)
HDL: 51 mg/dL (ref >39)
Total CHOL/HDL Ratio: 2.8 Ratio

## 2013-02-27 LAB — RENAL FUNCTION PANEL
BUN: 10 mg/dL (ref 6–23)
Calcium: 9.1 mg/dL (ref 8.4–10.5)
Creat: 0.81 mg/dL (ref 0.50–1.10)
Phosphorus: 4.1 mg/dL (ref 2.3–4.6)

## 2013-02-27 LAB — CBC
HCT: 40.1 % (ref 36.0–46.0)
MCHC: 30.7 g/dL (ref 30.0–36.0)
MCV: 68.2 fL — ABNORMAL LOW (ref 78.0–100.0)
RDW: 16.6 % — ABNORMAL HIGH (ref 11.5–15.5)

## 2013-02-27 NOTE — Telephone Encounter (Signed)
Pt presented to the lab and orders entered per 12/02/12 office note as below:  Labs prior to visit, lipid, renal, cbc, tsh hgab1c

## 2013-03-04 ENCOUNTER — Ambulatory Visit (INDEPENDENT_AMBULATORY_CARE_PROVIDER_SITE_OTHER): Payer: Medicare Other | Admitting: Family Medicine

## 2013-03-04 ENCOUNTER — Encounter: Payer: Self-pay | Admitting: Family Medicine

## 2013-03-04 VITALS — BP 122/82 | HR 90 | Temp 98.2°F | Ht 62.0 in | Wt 255.0 lb

## 2013-03-04 DIAGNOSIS — M1990 Unspecified osteoarthritis, unspecified site: Secondary | ICD-10-CM

## 2013-03-04 DIAGNOSIS — K219 Gastro-esophageal reflux disease without esophagitis: Secondary | ICD-10-CM

## 2013-03-04 DIAGNOSIS — E119 Type 2 diabetes mellitus without complications: Secondary | ICD-10-CM

## 2013-03-04 DIAGNOSIS — E039 Hypothyroidism, unspecified: Secondary | ICD-10-CM

## 2013-03-04 DIAGNOSIS — E669 Obesity, unspecified: Secondary | ICD-10-CM

## 2013-03-04 DIAGNOSIS — D649 Anemia, unspecified: Secondary | ICD-10-CM

## 2013-03-04 MED ORDER — LEVOTHYROXINE SODIUM 25 MCG PO TABS: 25.0000 ug | ORAL_TABLET | Freq: Every day | ORAL | Status: DC

## 2013-03-04 NOTE — Patient Instructions (Addendum)

## 2013-03-08 ENCOUNTER — Encounter: Payer: Self-pay | Admitting: Family Medicine

## 2013-03-08 NOTE — Assessment & Plan Note (Signed)
Diet controlled, minimize simple carbs, increase activity as tolerated

## 2013-03-08 NOTE — Progress Notes (Signed)
Patient ID: Katelyn Fisher, female   DOB: [birth year], 62 y.o.   MRN: [id number] Katelyn Fisher [id number] [birth year] 03/08/2013      Progress Note-Follow Up  Subjective  Chief Complaint  Chief Complaint  Patient presents with  . Follow-up    3 month    HPI  Patient is a 62 year old female who is in today for followup. Overall she's doing well. She's having persistent left knee pain. Some swelling is noted at times. She denies any trauma redness or warmth. She's recently been considerable clearing of by her orthopedist and that has been somewhat helpful. She's had some intermittent congestion, postnasal drip and pressure in her right ear but no fevers or chills. No sign of acute illness no chest pain, palpitations, shortness or breath, GI or GU concerns noted today.  Past Medical History  Diagnosis Date  . Asthma   . Hypertension   . Obesity   . Hx of colonic polyps   . GERD (gastroesophageal reflux disease)   . History of colonic diverticulitis   . Arthritis     back  . Hyperlipidemia   . Depression   . Low back pain radiating to right leg 08/18/2012  . Anxiety   . Benign paroxysmal positional vertigo 11/01/2012    Past Surgical History  Procedure Laterality Date  . Cholecystectomy  1991  . Dilation and curettage of uterus    . Tubal ligation    . Appendectomy      Family History  Problem Relation Age of Onset  . Heart disease Maternal Aunt     x 3 -2 brothers  . Hypertension Maternal Aunt   . Cancer Maternal Aunt     2 maternal aunts  . Prostate cancer Father   . Hyperlipidemia Brother   . Hyperlipidemia Sister   . Other      no FH colon cancer  . Diabetes Mother   . Diabetes Father     History   Social History  . Marital Status: Married    Spouse Name: N/A    Number of Children: N/A  . Years of Education: N/A   Occupational History  . Not on file.   Social History Main Topics  . Smoking status: Never Smoker   . Smokeless tobacco: Never Used   . Alcohol Use: No  . Drug Use: No  . Sexual Activity: Not on file   Other Topics Concern  . Not on file   Social History Narrative   Married- 40 years   Never Smoked   Alcohol use-no   Drug use-no   Daily Caffeine Use   Occupation: housewife   Caffeine use/day:  None   Does Patient Exercise:  yes    Current Outpatient Prescriptions on File Prior to Visit  Medication Sig Dispense Refill  . albuterol (PROAIR HFA) 108 (90 BASE) MCG/ACT inhaler Inhale 2 puffs into the lungs 2 (two) times daily as needed.  3 Inhaler  2  . amLODipine (NORVASC) 10 MG tablet Take 1 tablet (10 mg total) by mouth daily.  90 tablet  2  . aspirin 81 MG tablet Take 81 mg by mouth daily.        Marland Kitchen atorvastatin (LIPITOR) 20 MG tablet Take 0.5 tablets (10 mg total) by mouth daily.  90 tablet  2  . b complex vitamins capsule Take 1 capsule by mouth daily.        . benazepril (LOTENSIN) 40 MG tablet Take 1 tablet (40 mg total) by  mouth daily.  90 tablet  2  . budesonide-formoterol (SYMBICORT) 160-4.5 MCG/ACT inhaler Inhale 2 puffs into the lungs 2 (two) times daily.  3 Inhaler  2  . chlorpheniramine-HYDROcodone (TUSSIONEX PENNKINETIC ER) 10-8 MG/5ML LQCR Take 5 mLs by mouth every 12 (twelve) hours as needed.  140 mL  0  . fluticasone (FLONASE) 50 MCG/ACT nasal spray Place 2 sprays into the nose daily.  16 g  0  . furosemide (LASIX) 20 MG tablet Take 1 tablet (20 mg total) by mouth daily.  90 tablet  2  . meloxicam (MOBIC) 15 MG tablet Take 15 mg by mouth daily.      . montelukast (SINGULAIR) 10 MG tablet Take 1 tablet (10 mg total) by mouth at bedtime as needed.  30 tablet  3  . Multiple Vitamin (MULTIVITAMIN) tablet Take 1 tablet by mouth daily.      . nebivolol 20 MG TABS One by mouth daily  90 tablet  2  . pantoprazole (PROTONIX) 40 MG tablet Take 1 tablet (40 mg total) by mouth daily.  90 tablet  1  . pentoxifylline (TRENTAL) 400 MG CR tablet Take 400 mg by mouth 3 (three) times daily with meals.        .  traMADol (ULTRAM) 50 MG tablet Take 50 mg by mouth every 6 (six) hours as needed.        . triamcinolone (KENALOG) 0.1 % cream Apply 1 application topically. Apply topically to affected area once daily        No current facility-administered medications on file prior to visit.    Allergies  Allergen Reactions  . Shellfish Allergy Hives, Shortness Of Breath and Swelling  . Iodine     Rash  . Penicillins     REACTION: unspecified    Review of Systems  Review of Systems  Constitutional: Negative for fever and malaise/fatigue.  HENT: Positive for congestion.   Eyes: Negative for discharge.  Respiratory: Negative for shortness of breath.   Cardiovascular: Negative for chest pain, palpitations and leg swelling.  Gastrointestinal: Negative for nausea, abdominal pain and diarrhea.  Genitourinary: Negative for dysuria.  Musculoskeletal: Positive for joint pain. Negative for falls.       Left knee pain and stiffness, no redness, warmth or trauma. Some swelling at times  Skin: Negative for rash.  Neurological: Negative for loss of consciousness and headaches.  Endo/Heme/Allergies: Negative for polydipsia.  Psychiatric/Behavioral: Negative for depression and suicidal ideas. The patient is not nervous/anxious and does not have insomnia.     Objective  BP 122/82  Pulse 90  Temp(Src) 98.2 F (36.8 C) (Oral)  Ht 5\' 2"  (1.575 m)  Wt 255 lb (115.667 kg)  BMI 46.63 kg/m2  SpO2 98%  Physical Exam  Physical Exam  Constitutional: She is oriented to person, place, and time and well-developed, well-nourished, and in no distress. No distress.  HENT:  Head: Normocephalic and atraumatic.  Eyes: Conjunctivae are normal.  Neck: Neck supple. No thyromegaly present.  Cardiovascular: Normal rate, regular rhythm and normal heart sounds.   No murmur heard. Pulmonary/Chest: Effort normal and breath sounds normal. She has no wheezes.  Abdominal: She exhibits no distension and no mass.   Musculoskeletal: She exhibits no edema.  Lymphadenopathy:    She has no cervical adenopathy.  Neurological: She is alert and oriented to person, place, and time.  Skin: Skin is warm and dry. No rash noted. She is not diaphoretic.  Psychiatric: Memory, affect and judgment normal.  Lab Results  Component Value Date   TSH 6.913* 02/27/2013   Lab Results  Component Value Date   WBC 5.2 02/27/2013   HGB 12.3 02/27/2013   HCT 40.1 02/27/2013   MCV 68.2* 02/27/2013   PLT 212 02/27/2013   Lab Results  Component Value Date   CREATININE 0.81 02/27/2013   BUN 10 02/27/2013   NA 142 02/27/2013   K 4.3 02/27/2013   CL 105 02/27/2013   CO2 30 02/27/2013   Lab Results  Component Value Date   ALT 22 11/28/2012   AST 19 11/28/2012   ALKPHOS 99 11/28/2012   BILITOT 0.3 11/28/2012   Lab Results  Component Value Date   CHOL 143 02/27/2013   Lab Results  Component Value Date   HDL 51 02/27/2013   Lab Results  Component Value Date   LDLCALC 75 02/27/2013   Lab Results  Component Value Date   TRIG 83 02/27/2013   Lab Results  Component Value Date   CHOLHDL 2.8 02/27/2013     Assessment & Plan  Anemia resolved  Diabetes mellitus type 2, controlled Diet controlled, minimize simple carbs, increase activity as tolerated  Osteoarthritis Some relief with Voltaren gel, may try Salon Pas as well if more affordable  GERD No c/o on current meds. Continue same avoid offending foods  OBESITY, UNSPECIFIED Encouraged DASH diet, increase activity as tolerated

## 2013-03-08 NOTE — Assessment & Plan Note (Signed)
Some relief with Voltaren gel, may try Salon Pas as well if more affordable

## 2013-03-08 NOTE — Assessment & Plan Note (Signed)
Encouraged DASH diet, increase activity as tolerated

## 2013-03-08 NOTE — Assessment & Plan Note (Signed)
No c/o on current meds. Continue same avoid offending foods

## 2013-03-08 NOTE — Assessment & Plan Note (Signed)
resolved 

## 2013-03-09 ENCOUNTER — Encounter: Payer: Self-pay | Admitting: Family Medicine

## 2013-03-09 DIAGNOSIS — E039 Hypothyroidism, unspecified: Secondary | ICD-10-CM

## 2013-03-10 ENCOUNTER — Encounter: Payer: Self-pay | Admitting: Family Medicine

## 2013-03-10 MED ORDER — LEVOTHYROXINE SODIUM 25 MCG PO TABS: 25.0000 ug | ORAL_TABLET | Freq: Every day | ORAL | Status: DC

## 2013-03-10 NOTE — Telephone Encounter (Signed)
RX sent

## 2013-05-28 ENCOUNTER — Telehealth: Payer: Self-pay

## 2013-05-28 ENCOUNTER — Other Ambulatory Visit: Payer: Self-pay

## 2013-05-28 DIAGNOSIS — E785 Hyperlipidemia, unspecified: Secondary | ICD-10-CM

## 2013-05-28 DIAGNOSIS — Z0000 Encounter for general adult medical examination without abnormal findings: Secondary | ICD-10-CM

## 2013-05-28 DIAGNOSIS — I10 Essential (primary) hypertension: Secondary | ICD-10-CM

## 2013-05-28 NOTE — Telephone Encounter (Signed)
For hyperlipid, HTN and hyperglycemia

## 2013-05-28 NOTE — Telephone Encounter (Signed)
Pt was in today to have her labs drawn. Please advise which labs need to be done and diagnosis?

## 2013-05-28 NOTE — Telephone Encounter (Signed)
Needs lipid, renal, cbc, tsh, hepatic, hgba1c,

## 2013-05-29 LAB — HEMOGLOBIN A1C
Hgb A1c MFr Bld: 6.2 % — ABNORMAL HIGH (ref <5.7)
Mean Plasma Glucose: 131 mg/dL — ABNORMAL HIGH (ref <117)

## 2013-05-29 LAB — RENAL FUNCTION PANEL
BUN: 11 mg/dL (ref 6–23)
Calcium: 8.9 mg/dL (ref 8.4–10.5)
Phosphorus: 3.8 mg/dL (ref 2.3–4.6)
Potassium: 4 mEq/L (ref 3.5–5.3)

## 2013-05-29 LAB — HEPATIC FUNCTION PANEL
Albumin: 3.9 g/dL (ref 3.5–5.2)
Alkaline Phosphatase: 88 U/L (ref 39–117)
Total Bilirubin: 0.4 mg/dL (ref 0.3–1.2)

## 2013-05-29 LAB — LIPID PANEL
Cholesterol: 168 mg/dL (ref 0–200)
Total CHOL/HDL Ratio: 3.1 Ratio
VLDL: 20 mg/dL (ref 0–40)

## 2013-05-29 LAB — CBC
Hemoglobin: 12.9 g/dL (ref 12.0–15.0)
MCHC: 31.2 g/dL (ref 30.0–36.0)
Platelets: 230 10*3/uL (ref 150–400)
RDW: 17 % — ABNORMAL HIGH (ref 11.5–15.5)

## 2013-05-30 LAB — TSH: TSH: 2.926 u[IU]/mL (ref 0.350–4.500)

## 2013-06-01 ENCOUNTER — Telehealth: Payer: Self-pay | Admitting: Family Medicine

## 2013-06-01 ENCOUNTER — Ambulatory Visit (INDEPENDENT_AMBULATORY_CARE_PROVIDER_SITE_OTHER): Payer: Medicare Other | Admitting: Family Medicine

## 2013-06-01 ENCOUNTER — Encounter: Payer: Self-pay | Admitting: Family Medicine

## 2013-06-01 VITALS — BP 142/92 | HR 69 | Temp 97.8°F | Resp 16 | Ht 62.0 in | Wt 250.8 lb

## 2013-06-01 DIAGNOSIS — E039 Hypothyroidism, unspecified: Secondary | ICD-10-CM | POA: Insufficient documentation

## 2013-06-01 DIAGNOSIS — E079 Disorder of thyroid, unspecified: Secondary | ICD-10-CM | POA: Insufficient documentation

## 2013-06-01 DIAGNOSIS — E785 Hyperlipidemia, unspecified: Secondary | ICD-10-CM

## 2013-06-01 DIAGNOSIS — M545 Low back pain: Secondary | ICD-10-CM

## 2013-06-01 DIAGNOSIS — I10 Essential (primary) hypertension: Secondary | ICD-10-CM

## 2013-06-01 DIAGNOSIS — E669 Obesity, unspecified: Secondary | ICD-10-CM

## 2013-06-01 DIAGNOSIS — E119 Type 2 diabetes mellitus without complications: Secondary | ICD-10-CM

## 2013-06-01 DIAGNOSIS — D649 Anemia, unspecified: Secondary | ICD-10-CM

## 2013-06-01 DIAGNOSIS — Z23 Encounter for immunization: Secondary | ICD-10-CM

## 2013-06-01 HISTORY — DX: Disorder of thyroid, unspecified: E07.9

## 2013-06-01 HISTORY — DX: Hypothyroidism, unspecified: E03.9

## 2013-06-01 NOTE — Assessment & Plan Note (Signed)
tsh normalized continue current dose of Levothyroxine

## 2013-06-01 NOTE — Assessment & Plan Note (Signed)
Given handout on DASH diet and encouraged activity as tolerated

## 2013-06-01 NOTE — Telephone Encounter (Signed)
Lab order week of 08-26-2013 Labs prior to next visit lipid, renal, cbc, tsh, hepatic, hgba1c

## 2013-06-01 NOTE — Assessment & Plan Note (Addendum)
Mildly elevated with running late will recheck at next visit, encouraged DASH diet and patient will call with any concerning symptoms. Given flu shot today

## 2013-06-01 NOTE — Progress Notes (Signed)
Patient ID: Katelyn Fisher, female   DOB: 1951/02/22, 62 y.o.   MRN: [id number] Katelyn Fisher [id number] 1950/11/11 06/01/2013      Progress Note-Follow Up  Subjective  Chief Complaint  Chief Complaint  Patient presents with  . Follow-up    3-mth [DM, Osteoarthritis, GERD, Obesity]    HPI   ppatient is a 62 year old American female who is in today for routine followup. Overall she reports having done well. She did have an episode of mild vertigo last week but that resolved without incident. She did have some mild nasal congestion at that time but no fevers or chills. No green rhinorrhea. She took one dose of meclizine increased hydration increase to rest and her symptoms resolved. She denies chest pain, palpitations, shortness of breath, GI or GU concerns at today's visit and is taking medications as prescribed. Is had some intermittent trouble with low back and bilateral hip pain but it is well-controlled with occasional meloxicam. No incontinence noted.  Past Medical History  Diagnosis Date  . Asthma   . Hypertension   . Obesity   . Hx of colonic polyps   . GERD (gastroesophageal reflux disease)   . History of colonic diverticulitis   . Arthritis     back  . Hyperlipidemia   . Depression   . Low back pain radiating to right leg 08/18/2012  . Anxiety   . Benign paroxysmal positional vertigo 11/01/2012    Past Surgical History  Procedure Laterality Date  . Cholecystectomy  1991  . Dilation and curettage of uterus    . Tubal ligation    . Appendectomy      Family History  Problem Relation Age of Onset  . Heart disease Maternal Aunt     x 3 -2 brothers  . Hypertension Maternal Aunt   . Cancer Maternal Aunt     2 maternal aunts  . Prostate cancer Father   . Hyperlipidemia Brother   . Hyperlipidemia Sister   . Other      no FH colon cancer  . Diabetes Mother   . Diabetes Father     History   Social History  . Marital Status: Married    Spouse Name: N/A    Number of Children: N/A  . Years of Education: N/A   Occupational History  . Not on file.   Social History Main Topics  . Smoking status: Never Smoker   . Smokeless tobacco: Never Used  . Alcohol Use: No  . Drug Use: No  . Sexual Activity: Not on file   Other Topics Concern  . Not on file   Social History Narrative   Married- 40 years   Never Smoked   Alcohol use-no   Drug use-no   Daily Caffeine Use   Occupation: housewife   Caffeine use/day:  None   Does Patient Exercise:  yes    Current Outpatient Prescriptions on File Prior to Visit  Medication Sig Dispense Refill  . albuterol (PROAIR HFA) 108 (90 BASE) MCG/ACT inhaler Inhale 2 puffs into the lungs 2 (two) times daily as needed.  3 Inhaler  2  . amLODipine (NORVASC) 10 MG tablet Take 1 tablet (10 mg total) by mouth daily.  90 tablet  2  . aspirin 81 MG tablet Take 81 mg by mouth daily.        Marland Kitchen atorvastatin (LIPITOR) 20 MG tablet Take 0.5 tablets (10 mg total) by mouth daily.  90 tablet  2  . b complex  vitamins capsule Take 1 capsule by mouth daily.        . benazepril (LOTENSIN) 40 MG tablet Take 1 tablet (40 mg total) by mouth daily.  90 tablet  2  . budesonide-formoterol (SYMBICORT) 160-4.5 MCG/ACT inhaler Inhale 2 puffs into the lungs 2 (two) times daily.  3 Inhaler  2  . chlorpheniramine-HYDROcodone (TUSSIONEX PENNKINETIC ER) 10-8 MG/5ML LQCR Take 5 mLs by mouth every 12 (twelve) hours as needed.  140 mL  0  . diclofenac sodium (VOLTAREN) 1 % GEL Apply topically.      . fluticasone (FLONASE) 50 MCG/ACT nasal spray Place 2 sprays into the nose daily.  16 g  0  . furosemide (LASIX) 20 MG tablet Take 1 tablet (20 mg total) by mouth daily.  90 tablet  2  . levothyroxine (LEVOTHROID) 25 MCG tablet Take 1 tablet (25 mcg total) by mouth daily before breakfast.  90 tablet  1  . meloxicam (MOBIC) 15 MG tablet Take 15 mg by mouth daily.      . montelukast (SINGULAIR) 10 MG tablet Take 1 tablet (10 mg total) by mouth at  bedtime as needed.  30 tablet  3  . Multiple Vitamin (MULTIVITAMIN) tablet Take 1 tablet by mouth daily.      . pantoprazole (PROTONIX) 40 MG tablet Take 1 tablet (40 mg total) by mouth daily.  90 tablet  1  . pentoxifylline (TRENTAL) 400 MG CR tablet Take 400 mg by mouth 3 (three) times daily with meals.        . Probiotic Product (ALIGN) 4 MG CAPS Take by mouth.      . traMADol (ULTRAM) 50 MG tablet Take 50 mg by mouth every 6 (six) hours as needed.        . triamcinolone (KENALOG) 0.1 % cream Apply 1 application topically. Apply topically to affected area once daily       . nebivolol 20 MG TABS One by mouth daily  90 tablet  2   No current facility-administered medications on file prior to visit.    Allergies  Allergen Reactions  . Shellfish Allergy Hives, Shortness Of Breath and Swelling  . Iodine     Rash  . Penicillins     REACTION: unspecified    Review of Systems  Review of Systems  Constitutional: Negative.   HENT: Positive for congestion. Negative for sore throat.   Eyes: Negative.   Respiratory: Negative.   Cardiovascular: Negative.   Gastrointestinal: Negative.   Genitourinary: Negative.   Musculoskeletal:       Low back and hip pain intermittently  Neurological: Negative for headaches.  Psychiatric/Behavioral: Negative.     Objective  BP 142/92  Pulse 69  Temp(Src) 97.8 F (36.6 C) (Oral)  Resp 16  Ht 5\' 2"  (1.575 m)  Wt 250 lb 12 oz (113.739 kg)  BMI 45.85 kg/m2  SpO2 98%  Physical Exam  Physical Exam  Constitutional: She is oriented to person, place, and time and well-developed, well-nourished, and in no distress. No distress.  HENT:  Head: Normocephalic and atraumatic.  Eyes: Conjunctivae are normal.  Neck: Neck supple. No thyromegaly present.  Cardiovascular: Normal rate, regular rhythm and normal heart sounds.   No murmur heard. Pulmonary/Chest: Effort normal and breath sounds normal. She has no wheezes.  Abdominal: She exhibits no  distension and no mass.  Musculoskeletal: She exhibits no edema.  Lymphadenopathy:    She has no cervical adenopathy.  Neurological: She is alert and oriented to person, place,  and time.  Skin: Skin is warm and dry. No rash noted. She is not diaphoretic.  Psychiatric: Memory, affect and judgment normal.    Lab Results  Component Value Date   TSH 2.926 05/29/2013   Lab Results  Component Value Date   WBC 3.8* 05/29/2013   HGB 12.9 05/29/2013   HCT 41.4 05/29/2013   MCV 68.8* 05/29/2013   PLT 230 05/29/2013   Lab Results  Component Value Date   CREATININE 0.89 05/29/2013   BUN 11 05/29/2013   NA 139 05/29/2013   K 4.0 05/29/2013   CL 103 05/29/2013   CO2 29 05/29/2013   Lab Results  Component Value Date   ALT 22 05/29/2013   AST 20 05/29/2013   ALKPHOS 88 05/29/2013   BILITOT 0.4 05/29/2013   Lab Results  Component Value Date   CHOL 168 05/29/2013   Lab Results  Component Value Date   HDL 54 05/29/2013   Lab Results  Component Value Date   LDLCALC 94 05/29/2013   Lab Results  Component Value Date   TRIG 99 05/29/2013   Lab Results  Component Value Date   CHOLHDL 3.1 05/29/2013     Assessment & Plan  HYPERTENSION Mildly elevated with running late will recheck at next visit, encouraged DASH diet and patient will call with any concerning symptoms. Given flu shot today  Low back pain radiating to right leg Mild, infrequen, good response to Meloxicam prn, encouraged activity as tolerated. Moist heat and stay active as tolerated  Diabetes mellitus type 2, controlled Stable with hgba1c of 6.2 minimize simple carbs encouraged DASh diet  OBESITY, UNSPECIFIED Given handout on DASH diet and encouraged activity as tolerated  Thyroid disease tsh normalized continue current dose of Levothyroxine

## 2013-06-01 NOTE — Assessment & Plan Note (Signed)
Stable with hgba1c of 6.2 minimize simple carbs encouraged DASh diet

## 2013-06-01 NOTE — Patient Instructions (Signed)
DASH Diet  The DASH diet stands for "Dietary Approaches to Stop Hypertension." It is a healthy eating plan that has been shown to reduce high blood pressure (hypertension) in as little as 14 days, while also possibly providing other significant health benefits. These other health benefits include reducing the risk of breast cancer after menopause and reducing the risk of type 2 diabetes, heart disease, colon cancer, and stroke. Health benefits also include weight loss and slowing kidney failure in patients with chronic kidney disease.   DIET GUIDELINES  · Limit salt (sodium). Your diet should contain less than 1500 mg of sodium daily.  · Limit refined or processed carbohydrates. Your diet should include mostly whole grains. Desserts and added sugars should be used sparingly.  · Include small amounts of heart-healthy fats. These types of fats include nuts, oils, and tub margarine. Limit saturated and trans fats. These fats have been shown to be harmful in the body.  CHOOSING FOODS   The following food groups are based on a 2000 calorie diet. See your Registered Dietitian for individual calorie needs.  Grains and Grain Products (6 to 8 servings daily)  · Eat More Often: Whole-wheat bread, brown rice, whole-grain or wheat pasta, quinoa, popcorn without added fat or salt (air popped).  · Eat Less Often: White bread, white pasta, white rice, cornbread.  Vegetables (4 to 5 servings daily)  · Eat More Often: Fresh, frozen, and canned vegetables. Vegetables may be raw, steamed, roasted, or grilled with a minimal amount of fat.  · Eat Less Often/Avoid: Creamed or fried vegetables. Vegetables in a cheese sauce.  Fruit (4 to 5 servings daily)  · Eat More Often: All fresh, canned (in natural juice), or frozen fruits. Dried fruits without added sugar. One hundred percent fruit juice (½ cup [237 mL] daily).  · Eat Less Often: Dried fruits with added sugar. Canned fruit in light or heavy syrup.  Lean Meats, Fish, and Poultry (2  servings or less daily. One serving is 3 to 4 oz [85-114 g]).  · Eat More Often: Ninety percent or leaner ground beef, tenderloin, sirloin. Round cuts of beef, chicken breast, turkey breast. All fish. Grill, bake, or broil your meat. Nothing should be fried.  · Eat Less Often/Avoid: Fatty cuts of meat, turkey, or chicken leg, thigh, or wing. Fried cuts of meat or fish.  Dairy (2 to 3 servings)  · Eat More Often: Low-fat or fat-free milk, low-fat plain or light yogurt, reduced-fat or part-skim cheese.  · Eat Less Often/Avoid: Milk (whole, 2%). Whole milk yogurt. Full-fat cheeses.  Nuts, Seeds, and Legumes (4 to 5 servings per week)  · Eat More Often: All without added salt.  · Eat Less Often/Avoid: Salted nuts and seeds, canned beans with added salt.  Fats and Sweets (limited)  · Eat More Often: Vegetable oils, tub margarines without trans fats, sugar-free gelatin. Mayonnaise and salad dressings.  · Eat Less Often/Avoid: Coconut oils, palm oils, butter, stick margarine, cream, half and half, cookies, candy, pie.  FOR MORE INFORMATION  The Dash Diet Eating Plan: www.dashdiet.org  Document Released: 06/28/2011 Document Revised: 10/01/2011 Document Reviewed: 06/28/2011  [employer]® Patient Information ©2014 [employer].

## 2013-06-01 NOTE — Assessment & Plan Note (Signed)
Mild, infrequen, good response to Meloxicam prn, encouraged activity as tolerated. Moist heat and stay active as tolerated

## 2013-06-05 NOTE — Telephone Encounter (Signed)
Lab order placed.

## 2013-07-06 ENCOUNTER — Encounter: Payer: Self-pay | Admitting: Physician Assistant

## 2013-07-06 ENCOUNTER — Telehealth: Payer: Self-pay | Admitting: Physician Assistant

## 2013-07-06 ENCOUNTER — Ambulatory Visit (INDEPENDENT_AMBULATORY_CARE_PROVIDER_SITE_OTHER): Payer: Medicare Other | Admitting: Physician Assistant

## 2013-07-06 VITALS — BP 132/88 | HR 72 | Temp 98.9°F | Resp 16 | Wt 250.0 lb

## 2013-07-06 DIAGNOSIS — J45909 Unspecified asthma, uncomplicated: Secondary | ICD-10-CM

## 2013-07-06 MED ORDER — HYDROCOD POLST-CHLORPHEN POLST 10-8 MG/5ML PO LQCR: 5.0000 mL | Freq: Two times a day (BID) | ORAL | Status: DC | PRN

## 2013-07-06 MED ORDER — HYDROCODONE-HOMATROPINE 5-1.5 MG/5ML PO SYRP: 5.0000 mL | ORAL_SOLUTION | Freq: Three times a day (TID) | ORAL | Status: DC | PRN

## 2013-07-06 MED ORDER — AZITHROMYCIN 250 MG PO TABS: ORAL_TABLET | ORAL | Status: DC

## 2013-07-06 MED ORDER — ALBUTEROL SULFATE (2.5 MG/3ML) 0.083% IN NEBU: 2.5000 mg | INHALATION_SOLUTION | Freq: Four times a day (QID) | RESPIRATORY_TRACT | Status: DC | PRN

## 2013-07-06 NOTE — Progress Notes (Signed)
Patient ID: Katelyn Fisher, female   DOB: [birth year], 62 y.o.   MRN: [id number]  Patient presents c/o 1 week of sore throat, R ear pain, chest congestion and cough productive of sputum.  Patient does have history of asthma and has run out of her Albuterol inaler.  Is taking Symbicort as prescribed.  Also taking Flonase nasal spray.  Denies fever, shortness of breath, pleuritic chest pain, recent travel or sick contact.  Patient does endorse mild wheezing at bedtime.  Cough is keeping her from sleeping.   Past Medical History  Diagnosis Date  . Asthma   . Hypertension   . Obesity   . Hx of colonic polyps   . GERD (gastroesophageal reflux disease)   . History of colonic diverticulitis   . Arthritis     back  . Hyperlipidemia   . Depression   . Low back pain radiating to right leg 08/18/2012  . Anxiety   . Benign paroxysmal positional vertigo 11/01/2012  . Thyroid disease 06/01/2013    Current Outpatient Prescriptions on File Prior to Visit  Medication Sig Dispense Refill  . albuterol (PROAIR HFA) 108 (90 BASE) MCG/ACT inhaler Inhale 2 puffs into the lungs 2 (two) times daily as needed.  3 Inhaler  2  . amLODipine (NORVASC) 10 MG tablet Take 1 tablet (10 mg total) by mouth daily.  90 tablet  2  . aspirin 81 MG tablet Take 81 mg by mouth daily.        Marland Kitchen b complex vitamins capsule Take 1 capsule by mouth daily.        . benazepril (LOTENSIN) 40 MG tablet Take 1 tablet (40 mg total) by mouth daily.  90 tablet  2  . budesonide-formoterol (SYMBICORT) 160-4.5 MCG/ACT inhaler Inhale 2 puffs into the lungs 2 (two) times daily.  3 Inhaler  2  . diclofenac sodium (VOLTAREN) 1 % GEL Apply topically.      . fluticasone (FLONASE) 50 MCG/ACT nasal spray Place 2 sprays into the nose daily.  16 g  0  . furosemide (LASIX) 20 MG tablet Take 1 tablet (20 mg total) by mouth daily.  90 tablet  2  . levothyroxine (LEVOTHROID) 25 MCG tablet Take 1 tablet (25 mcg total) by mouth daily before breakfast.  90  tablet  1  . meloxicam (MOBIC) 15 MG tablet Take 15 mg by mouth daily.      . montelukast (SINGULAIR) 10 MG tablet Take 1 tablet (10 mg total) by mouth at bedtime as needed.  30 tablet  3  . Multiple Vitamin (MULTIVITAMIN) tablet Take 1 tablet by mouth daily.      . pantoprazole (PROTONIX) 40 MG tablet Take 1 tablet (40 mg total) by mouth daily.  90 tablet  1  . pentoxifylline (TRENTAL) 400 MG CR tablet Take 400 mg by mouth 3 (three) times daily with meals.        . Probiotic Product (ALIGN) 4 MG CAPS Take by mouth.      . traMADol (ULTRAM) 50 MG tablet Take 50 mg by mouth every 6 (six) hours as needed.        . triamcinolone (KENALOG) 0.1 % cream Apply 1 application topically. Apply topically to affected area once daily       . atorvastatin (LIPITOR) 20 MG tablet Take 0.5 tablets (10 mg total) by mouth daily.  90 tablet  2  . nebivolol 20 MG TABS One by mouth daily  90 tablet  2   No  current facility-administered medications on file prior to visit.    Allergies  Allergen Reactions  . Shellfish Allergy Hives, Shortness Of Breath and Swelling  . Iodine     Rash  . Penicillins     REACTION: unspecified    Family History  Problem Relation Age of Onset  . Heart disease Maternal Aunt     x 3 -2 brothers  . Hypertension Maternal Aunt   . Cancer Maternal Aunt     2 maternal aunts  . Prostate cancer Father   . Hyperlipidemia Brother   . Hyperlipidemia Sister   . Other      no FH colon cancer  . Diabetes Mother   . Diabetes Father     History   Social History  . Marital Status: Married    Spouse Name: N/A    Number of Children: N/A  . Years of Education: N/A   Social History Main Topics  . Smoking status: Never Smoker   . Smokeless tobacco: Never Used  . Alcohol Use: No  . Drug Use: No  . Sexual Activity: None   Other Topics Concern  . None   Social History Narrative   Married- 40 years   Never Smoked   Alcohol use-no   Drug use-no   Daily Caffeine Use    Occupation: housewife   Caffeine use/day:  None   Does Patient Exercise:  yes   Review of Systems - See HPI.  All other ROS are negative.  Filed Vitals:   07/06/13 1416  BP: 132/88  Pulse: 72  Temp: 98.9 F (37.2 C)  Resp: 16   Physical Exam  Vitals reviewed. Constitutional: She is oriented to person, place, and time and well-developed, well-nourished, and in no distress.  HENT:  Head: Normocephalic and atraumatic.  Right Ear: External ear normal.  Left Ear: External ear normal.  Nose: Nose normal.  Mouth/Throat: Oropharynx is clear and moist. No oropharyngeal exudate.  TM within normal limits bilaterally.  No tenderness to percussion of sinuses noted.  Eyes: Conjunctivae are normal.  Neck: Neck supple.  Cardiovascular: Normal rate, regular rhythm, normal heart sounds and intact distal pulses.   Pulmonary/Chest: Effort normal and breath sounds normal. No respiratory distress. She has no wheezes. She has no rales. She exhibits no tenderness.  Lymphadenopathy:    She has no cervical adenopathy.  Neurological: She is alert and oriented to person, place, and time.  Skin: Skin is warm and dry. No rash noted.  Psychiatric: Affect normal.     Recent Results (from the past 2160 hour(s))  LIPID PANEL     Status: None   Collection Time    05/29/13 12:05 PM      Result Value Range   Cholesterol 168  0 - 200 mg/dL   Comment: ATP III Classification:           < 200        mg/dL        Desirable          200 - 239     mg/dL        Borderline High          >= 240        mg/dL        High         Triglycerides 99  <150 mg/dL   HDL 54  >16 mg/dL   Total CHOL/HDL Ratio 3.1     VLDL 20  0 - 40  mg/dL   LDL Cholesterol 94  0 - 99 mg/dL   Comment:       Total Cholesterol/HDL Ratio:CHD Risk                            Coronary Heart Disease Risk Table                                            Men       Women              1/2 Average Risk              3.4        3.3                   Average Risk              5.0        4.4               2X Average Risk              9.6        7.1               3X Average Risk             23.4       11.0     Use the calculated Patient Ratio above and the CHD Risk table      to determine the patient's CHD Risk.     ATP III Classification (LDL):           < 100        mg/dL         Optimal          100 - 129     mg/dL         Near or Above Optimal          130 - 159     mg/dL         Borderline High          160 - 189     mg/dL         High           > 190        mg/dL         Very High        RENAL FUNCTION PANEL     Status: None   Collection Time    05/29/13 12:05 PM      Result Value Range   Sodium 139  135 - 145 mEq/L   Potassium 4.0  3.5 - 5.3 mEq/L   Chloride 103  96 - 112 mEq/L   CO2 29  19 - 32 mEq/L   Glucose, Bld 97  70 - 99 mg/dL   BUN 11  6 - 23 mg/dL   Creat 4.09  8.11 - 9.14 mg/dL   Albumin 3.9  3.5 - 5.2 g/dL   Calcium 8.9  8.4 - 78.2 mg/dL   Phosphorus 3.8  2.3 - 4.6 mg/dL  CBC     Status: Abnormal   Collection Time    05/29/13 12:05 PM      Result Value Range   WBC 3.8 (*) 4.0 - 10.5 K/uL  RBC 6.02 (*) 3.87 - 5.11 MIL/uL   Hemoglobin 12.9  12.0 - 15.0 g/dL   HCT 16.1  09.6 - 04.5 %   MCV 68.8 (*) 78.0 - 100.0 fL   MCH 21.4 (*) 26.0 - 34.0 pg   MCHC 31.2  30.0 - 36.0 g/dL   RDW 40.9 (*) 81.1 - 91.4 %   Platelets 230  150 - 400 K/uL  TSH     Status: None   Collection Time    05/29/13 12:05 PM      Result Value Range   TSH 2.926  0.350 - 4.500 uIU/mL  HEPATIC FUNCTION PANEL     Status: None   Collection Time    05/29/13 12:05 PM      Result Value Range   Total Bilirubin 0.4  0.3 - 1.2 mg/dL   Bilirubin, Direct 0.1  0.0 - 0.3 mg/dL   Indirect Bilirubin 0.3  0.0 - 0.9 mg/dL   Alkaline Phosphatase 88  39 - 117 U/L   AST 20  0 - 37 U/L   ALT 22  0 - 35 U/L   Total Protein 7.1  6.0 - 8.3 g/dL   Albumin 3.9  3.5 - 5.2 g/dL  HEMOGLOBIN N8G     Status: Abnormal   Collection Time    05/29/13 12:05 PM       Result Value Range   Hemoglobin A1C 6.2 (*) <5.7 %   Comment:                                                                            According to the ADA Clinical Practice Recommendations for 2011, when     HbA1c is used as a screening test:             >=6.5%   Diagnostic of Diabetes Mellitus                (if abnormal result is confirmed)           5.7-6.4%   Increased risk of developing Diabetes Mellitus           References:Diagnosis and Classification of Diabetes Mellitus,Diabetes     Care,2011,34(Suppl 1):S62-S69 and Standards of Medical Care in             Diabetes - 2011,Diabetes Care,2011,34 (Suppl 1):S11-S61.         Mean Plasma Glucose 131 (*) <117 mg/dL    Assessment/Plan: Asthma with bronchitis Rx Azithromycin.  Refill Albuterol nebulizer solution.  Continue Asthma medications.  Rx hycodan for cough.  Rest. increase fluid intake.  Probiotic.  Saline nasal spray.  Humidifier in bedroom.  Call or return to clinic if symptoms are not improving.

## 2013-07-06 NOTE — Patient Instructions (Signed)
Take antibiotic as prescribed.  Rest.  Symbicort and Albuterol as prescribed.  Flonase and saline nasal spray daily.  Probiotic.  Claritin daily.  Tussionex for cough.  Please call or return to clinic if symptoms are worsen or do not improve over the next week.  Cough is usually the very last symptom to go away, but it should become dry and non-productive.

## 2013-07-06 NOTE — Telephone Encounter (Signed)
tussionex is not covered by insurance, request something else be called in

## 2013-07-07 DIAGNOSIS — J45909 Unspecified asthma, uncomplicated: Secondary | ICD-10-CM | POA: Insufficient documentation

## 2013-07-07 NOTE — Telephone Encounter (Signed)
Already aware of situation.  Patient returned to office yesterday after visit.  Katelyn Fisher explained situation.  New prescription for Hycodan cough syrup printed and given to patient.

## 2013-07-07 NOTE — Assessment & Plan Note (Signed)
Rx Azithromycin.  Refill Albuterol nebulizer solution.  Continue Asthma medications.  Rx hycodan for cough.  Rest. increase fluid intake.  Probiotic.  Saline nasal spray.  Humidifier in bedroom.  Call or return to clinic if symptoms are not improving.

## 2013-07-14 ENCOUNTER — Ambulatory Visit: Payer: Commercial Managed Care - HMO | Admitting: Family Medicine

## 2013-07-20 ENCOUNTER — Ambulatory Visit: Payer: Medicare Other | Attending: Physical Medicine and Rehabilitation | Admitting: Physical Therapy

## 2013-07-20 DIAGNOSIS — M793 Panniculitis, unspecified: Secondary | ICD-10-CM | POA: Insufficient documentation

## 2013-07-20 DIAGNOSIS — M25669 Stiffness of unspecified knee, not elsewhere classified: Secondary | ICD-10-CM | POA: Insufficient documentation

## 2013-07-20 DIAGNOSIS — R609 Edema, unspecified: Secondary | ICD-10-CM | POA: Insufficient documentation

## 2013-07-20 DIAGNOSIS — J449 Chronic obstructive pulmonary disease, unspecified: Secondary | ICD-10-CM | POA: Insufficient documentation

## 2013-07-20 DIAGNOSIS — J4489 Other specified chronic obstructive pulmonary disease: Secondary | ICD-10-CM | POA: Insufficient documentation

## 2013-07-20 DIAGNOSIS — R269 Unspecified abnormalities of gait and mobility: Secondary | ICD-10-CM | POA: Insufficient documentation

## 2013-07-20 DIAGNOSIS — IMO0001 Reserved for inherently not codable concepts without codable children: Secondary | ICD-10-CM | POA: Insufficient documentation

## 2013-07-20 DIAGNOSIS — M25569 Pain in unspecified knee: Secondary | ICD-10-CM | POA: Insufficient documentation

## 2013-07-20 DIAGNOSIS — M6281 Muscle weakness (generalized): Secondary | ICD-10-CM | POA: Insufficient documentation

## 2013-07-23 DIAGNOSIS — C541 Malignant neoplasm of endometrium: Secondary | ICD-10-CM

## 2013-07-23 DIAGNOSIS — Z9221 Personal history of antineoplastic chemotherapy: Secondary | ICD-10-CM | POA: Insufficient documentation

## 2013-07-23 HISTORY — DX: Malignant neoplasm of endometrium: C54.1

## 2013-07-23 HISTORY — DX: Personal history of antineoplastic chemotherapy: Z92.21

## 2013-07-24 ENCOUNTER — Encounter: Payer: Self-pay | Admitting: Family Medicine

## 2013-07-27 ENCOUNTER — Encounter: Payer: Self-pay | Admitting: Family Medicine

## 2013-07-27 DIAGNOSIS — E039 Hypothyroidism, unspecified: Secondary | ICD-10-CM

## 2013-07-28 ENCOUNTER — Ambulatory Visit: Payer: Medicare HMO | Attending: Physical Medicine and Rehabilitation | Admitting: Physical Therapy

## 2013-07-28 DIAGNOSIS — M25669 Stiffness of unspecified knee, not elsewhere classified: Secondary | ICD-10-CM | POA: Insufficient documentation

## 2013-07-28 DIAGNOSIS — M25569 Pain in unspecified knee: Secondary | ICD-10-CM | POA: Insufficient documentation

## 2013-07-28 DIAGNOSIS — R609 Edema, unspecified: Secondary | ICD-10-CM | POA: Insufficient documentation

## 2013-07-28 DIAGNOSIS — IMO0001 Reserved for inherently not codable concepts without codable children: Secondary | ICD-10-CM | POA: Insufficient documentation

## 2013-07-28 DIAGNOSIS — M6281 Muscle weakness (generalized): Secondary | ICD-10-CM | POA: Insufficient documentation

## 2013-07-28 DIAGNOSIS — R269 Unspecified abnormalities of gait and mobility: Secondary | ICD-10-CM | POA: Insufficient documentation

## 2013-07-30 MED ORDER — AMLODIPINE BESYLATE 10 MG PO TABS: 10.0000 mg | ORAL_TABLET | Freq: Every day | ORAL | Status: DC

## 2013-07-30 MED ORDER — BENAZEPRIL HCL 40 MG PO TABS: 40.0000 mg | ORAL_TABLET | Freq: Every day | ORAL | Status: DC

## 2013-07-30 MED ORDER — PANTOPRAZOLE SODIUM 40 MG PO TBEC: 40.0000 mg | DELAYED_RELEASE_TABLET | Freq: Every day | ORAL | Status: DC

## 2013-07-30 MED ORDER — LEVOTHYROXINE SODIUM 25 MCG PO TABS: 25.0000 ug | ORAL_TABLET | Freq: Every day | ORAL | Status: DC

## 2013-07-30 MED ORDER — FUROSEMIDE 20 MG PO TABS: 20.0000 mg | ORAL_TABLET | Freq: Every day | ORAL | Status: DC

## 2013-07-31 MED ORDER — NEBIVOLOL HCL 20 MG PO TABS: 20.0000 mg | ORAL_TABLET | Freq: Every day | ORAL | Status: DC

## 2013-07-31 NOTE — Addendum Note (Signed)
Addended by: Varney Daily on: 07/31/2013 11:14 AM   Modules accepted: Orders

## 2013-08-04 ENCOUNTER — Ambulatory Visit: Payer: Medicare HMO | Admitting: Rehabilitation

## 2013-08-04 ENCOUNTER — Other Ambulatory Visit: Payer: Self-pay

## 2013-08-04 DIAGNOSIS — Z1231 Encounter for screening mammogram for malignant neoplasm of breast: Secondary | ICD-10-CM

## 2013-08-06 ENCOUNTER — Encounter: Payer: Self-pay | Admitting: Family Medicine

## 2013-08-06 MED ORDER — ATORVASTATIN CALCIUM 20 MG PO TABS: 10.0000 mg | ORAL_TABLET | Freq: Every day | ORAL | Status: DC

## 2013-08-06 NOTE — Telephone Encounter (Signed)
Please advise mychart message? [facility] is out of Bystolic 20 mg. Does pt need to take Bystolic 10 mg 2 tabs at once or 1 bid?

## 2013-08-07 MED ORDER — NEBIVOLOL HCL 10 MG PO TABS: 10.0000 mg | ORAL_TABLET | Freq: Two times a day (BID) | ORAL | Status: DC

## 2013-08-11 ENCOUNTER — Telehealth: Payer: Self-pay | Admitting: Family Medicine

## 2013-08-11 ENCOUNTER — Ambulatory Visit: Payer: Medicare HMO | Admitting: Physical Therapy

## 2013-08-11 NOTE — Telephone Encounter (Signed)
Patient states that [facility] did not receive the bystolic refill. Please resend. Patient states that she is taking 20mg .

## 2013-08-11 NOTE — Telephone Encounter (Signed)
Spoke with Hope at [facility]. She verified receipt of previous rx sent and states they need to speak with pt re: balance on acct before they can ship medication. Left detailed message on pt's home # to call [phone] re: acct.

## 2013-08-18 ENCOUNTER — Ambulatory Visit: Payer: Medicare HMO | Admitting: Physical Therapy

## 2013-08-25 ENCOUNTER — Ambulatory Visit: Payer: Medicare HMO | Attending: Physical Medicine and Rehabilitation | Admitting: Rehabilitation

## 2013-08-25 DIAGNOSIS — IMO0001 Reserved for inherently not codable concepts without codable children: Secondary | ICD-10-CM | POA: Diagnosis present

## 2013-08-25 DIAGNOSIS — R609 Edema, unspecified: Secondary | ICD-10-CM | POA: Insufficient documentation

## 2013-08-25 DIAGNOSIS — R269 Unspecified abnormalities of gait and mobility: Secondary | ICD-10-CM | POA: Insufficient documentation

## 2013-08-25 DIAGNOSIS — M25569 Pain in unspecified knee: Secondary | ICD-10-CM | POA: Insufficient documentation

## 2013-08-25 DIAGNOSIS — M6281 Muscle weakness (generalized): Secondary | ICD-10-CM | POA: Diagnosis not present

## 2013-08-25 DIAGNOSIS — M25669 Stiffness of unspecified knee, not elsewhere classified: Secondary | ICD-10-CM | POA: Insufficient documentation

## 2013-08-27 ENCOUNTER — Ambulatory Visit
Admission: RE | Admit: 2013-08-27 | Discharge: 2013-08-27 | Disposition: A | Discharge status: Home / Self Care | Payer: Commercial Managed Care - HMO | Source: Ambulatory Visit

## 2013-08-27 DIAGNOSIS — Z1231 Encounter for screening mammogram for malignant neoplasm of breast: Secondary | ICD-10-CM

## 2013-08-28 ENCOUNTER — Encounter: Payer: Self-pay | Admitting: Family Medicine

## 2013-09-01 ENCOUNTER — Ambulatory Visit: Payer: Medicare HMO | Admitting: Physical Therapy

## 2013-09-01 DIAGNOSIS — IMO0001 Reserved for inherently not codable concepts without codable children: Secondary | ICD-10-CM | POA: Diagnosis not present

## 2013-09-04 ENCOUNTER — Telehealth: Payer: Self-pay | Admitting: Family Medicine

## 2013-09-04 ENCOUNTER — Ambulatory Visit (INDEPENDENT_AMBULATORY_CARE_PROVIDER_SITE_OTHER): Payer: Medicare HMO | Admitting: Family Medicine

## 2013-09-04 VITALS — BP 138/88 | HR 74 | Temp 97.8°F | Resp 16 | Ht 62.0 in | Wt 246.5 lb

## 2013-09-04 DIAGNOSIS — I10 Essential (primary) hypertension: Secondary | ICD-10-CM

## 2013-09-04 DIAGNOSIS — K219 Gastro-esophageal reflux disease without esophagitis: Secondary | ICD-10-CM

## 2013-09-04 DIAGNOSIS — E119 Type 2 diabetes mellitus without complications: Secondary | ICD-10-CM

## 2013-09-04 DIAGNOSIS — R739 Hyperglycemia, unspecified: Secondary | ICD-10-CM

## 2013-09-04 DIAGNOSIS — D649 Anemia, unspecified: Secondary | ICD-10-CM

## 2013-09-04 DIAGNOSIS — J45909 Unspecified asthma, uncomplicated: Secondary | ICD-10-CM

## 2013-09-04 DIAGNOSIS — R7309 Other abnormal glucose: Secondary | ICD-10-CM

## 2013-09-04 DIAGNOSIS — E039 Hypothyroidism, unspecified: Secondary | ICD-10-CM

## 2013-09-04 DIAGNOSIS — E785 Hyperlipidemia, unspecified: Secondary | ICD-10-CM

## 2013-09-04 DIAGNOSIS — E079 Disorder of thyroid, unspecified: Secondary | ICD-10-CM

## 2013-09-04 MED ORDER — HYDROCODONE-HOMATROPINE 5-1.5 MG/5ML PO SYRP: 5.0000 mL | ORAL_SOLUTION | Freq: Three times a day (TID) | ORAL | Status: DC | PRN

## 2013-09-04 NOTE — Patient Instructions (Signed)

## 2013-09-04 NOTE — Progress Notes (Signed)
Pre visit review using our clinic review tool, if applicable. No additional management support is needed unless otherwise documented below in the visit note/SLS  

## 2013-09-04 NOTE — Telephone Encounter (Signed)
Lab order week of 11-23-2013 Labs prior to next visit lipid, renal, cbc, tsh, hepatic, hgba1c next visit annual

## 2013-09-06 ENCOUNTER — Encounter: Payer: Self-pay | Admitting: Family Medicine

## 2013-09-06 NOTE — Assessment & Plan Note (Signed)
Well controlled, avoid simple carbs

## 2013-09-06 NOTE — Progress Notes (Signed)
Patient ID: Katelyn Fisher, female   DOB: 29-Feb-1951, 63 y.o.   MRN: [id number] Katelyn Fisher [id number] [birth year] 09/06/2013      Progress Note-Follow Up  Subjective  Chief Complaint  Chief Complaint  Patient presents with  . Follow-up    3-mth. [HTN, DM, Obesity, Thyroid disease]    HPI  Patient is a 63 year old female who is in today for followup. She's been struggling with some pain in her right knee but is managing at and it is improving. Her asthma has been stable. No recent flares. No need for albuterol use. No chest pain, palpitations or shortness of breath. Taking medications as prescribed. Denies polyuria and polydipsia.  Past Medical History  Diagnosis Date  . Asthma   . Hypertension   . Obesity   . Hx of colonic polyps   . GERD (gastroesophageal reflux disease)   . History of colonic diverticulitis   . Arthritis     back  . Hyperlipidemia   . Depression   . Low back pain radiating to right leg 08/18/2012  . Anxiety   . Benign paroxysmal positional vertigo 11/01/2012  . Thyroid disease 06/01/2013    Past Surgical History  Procedure Laterality Date  . Cholecystectomy  1991  . Dilation and curettage of uterus    . Tubal ligation    . Appendectomy      Family History  Problem Relation Age of Onset  . Heart disease Maternal Aunt     x 3 -2 brothers  . Hypertension Maternal Aunt   . Cancer Maternal Aunt     2 maternal aunts  . Prostate cancer Father   . Hyperlipidemia Brother   . Hyperlipidemia Sister   . Other      no FH colon cancer  . Diabetes Mother   . Diabetes Father     History   Social History  . Marital Status: Married    Spouse Name: N/A    Number of Children: N/A  . Years of Education: N/A   Occupational History  . Not on file.   Social History Main Topics  . Smoking status: Never Smoker   . Smokeless tobacco: Never Used  . Alcohol Use: No  . Drug Use: No  . Sexual Activity: Not on file   Other Topics Concern  . Not on  file   Social History Narrative   Married- 40 years   Never Smoked   Alcohol use-no   Drug use-no   Daily Caffeine Use   Occupation: housewife   Caffeine use/day:  None   Does Patient Exercise:  yes    Current Outpatient Prescriptions on File Prior to Visit  Medication Sig Dispense Refill  . albuterol (PROAIR HFA) 108 (90 BASE) MCG/ACT inhaler Inhale 2 puffs into the lungs 2 (two) times daily as needed.  3 Inhaler  2  . albuterol (PROVENTIL) (2.5 MG/3ML) 0.083% nebulizer solution Take 3 mLs (2.5 mg total) by nebulization every 6 (six) hours as needed for wheezing or shortness of breath.  150 mL  1  . amLODipine (NORVASC) 10 MG tablet Take 1 tablet (10 mg total) by mouth daily.  90 tablet  2  . aspirin 81 MG tablet Take 81 mg by mouth daily.        Marland Kitchen atorvastatin (LIPITOR) 20 MG tablet Take 0.5 tablets (10 mg total) by mouth daily.  90 tablet  2  . azithromycin (ZITHROMAX) 250 MG tablet Take 2 tablets daily.  Then take 1 tablet  daily.  6 tablet  0  . b complex vitamins capsule Take 1 capsule by mouth daily.        . benazepril (LOTENSIN) 40 MG tablet Take 1 tablet (40 mg total) by mouth daily.  90 tablet  2  . budesonide-formoterol (SYMBICORT) 160-4.5 MCG/ACT inhaler Inhale 2 puffs into the lungs 2 (two) times daily.  3 Inhaler  2  . diclofenac sodium (VOLTAREN) 1 % GEL Apply topically.      . fluticasone (FLONASE) 50 MCG/ACT nasal spray Place 2 sprays into the nose daily.  16 g  0  . furosemide (LASIX) 20 MG tablet Take 1 tablet (20 mg total) by mouth daily.  90 tablet  2  . levothyroxine (LEVOTHROID) 25 MCG tablet Take 1 tablet (25 mcg total) by mouth daily before breakfast.  90 tablet  2  . meloxicam (MOBIC) 15 MG tablet Take 15 mg by mouth daily.      . montelukast (SINGULAIR) 10 MG tablet Take 1 tablet (10 mg total) by mouth at bedtime as needed.  30 tablet  3  . Multiple Vitamin (MULTIVITAMIN) tablet Take 1 tablet by mouth daily.      . nebivolol (BYSTOLIC) 10 MG tablet Take 1  tablet (10 mg total) by mouth 2 (two) times daily.  180 tablet  1  . pantoprazole (PROTONIX) 40 MG tablet Take 1 tablet (40 mg total) by mouth daily.  90 tablet  2  . pentoxifylline (TRENTAL) 400 MG CR tablet Take 400 mg by mouth 3 (three) times daily with meals.        . Probiotic Product (ALIGN) 4 MG CAPS Take by mouth.      . traMADol (ULTRAM) 50 MG tablet Take 50 mg by mouth every 6 (six) hours as needed.        . triamcinolone (KENALOG) 0.1 % cream Apply 1 application topically. Apply topically to affected area once daily        No current facility-administered medications on file prior to visit.    Allergies  Allergen Reactions  . Shellfish Allergy Hives, Shortness Of Breath and Swelling  . Iodine     Rash  . Penicillins     REACTION: unspecified    Review of Systems  Review of Systems  Constitutional: Negative for fever and malaise/fatigue.  HENT: Negative for congestion.   Eyes: Negative for discharge.  Respiratory: Negative for shortness of breath.   Cardiovascular: Negative for chest pain, palpitations and leg swelling.  Gastrointestinal: Negative for nausea, abdominal pain and diarrhea.  Genitourinary: Negative for dysuria.  Musculoskeletal: Positive for joint pain. Negative for falls.       Right knee pain responding to PT  Skin: Negative for rash.  Neurological: Negative for loss of consciousness and headaches.  Endo/Heme/Allergies: Negative for polydipsia.  Psychiatric/Behavioral: Negative for depression and suicidal ideas. The patient is not nervous/anxious and does not have insomnia.     Objective  BP 138/88  Pulse 74  Temp(Src) 97.8 F (36.6 C) (Oral)  Resp 16  Ht 5\' 2"  (1.575 m)  Wt 246 lb 8 oz (111.812 kg)  BMI 45.07 kg/m2  SpO2 96%  Physical Exam  Physical Exam  Constitutional: She is oriented to person, place, and time and well-developed, well-nourished, and in no distress. No distress.  HENT:  Head: Normocephalic and atraumatic.  Eyes:  Conjunctivae are normal.  Neck: Neck supple. No thyromegaly present.  Cardiovascular: Normal rate, regular rhythm and normal heart sounds.   No murmur heard.  Pulmonary/Chest: Effort normal and breath sounds normal. She has no wheezes.  Abdominal: She exhibits no distension and no mass.  Musculoskeletal: She exhibits no edema.  Lymphadenopathy:    She has no cervical adenopathy.  Neurological: She is alert and oriented to person, place, and time.  Skin: Skin is warm and dry. No rash noted. She is not diaphoretic.  Psychiatric: Memory, affect and judgment normal.    Lab Results  Component Value Date   TSH 2.926 05/29/2013   Lab Results  Component Value Date   WBC 3.8* 05/29/2013   HGB 12.9 05/29/2013   HCT 41.4 05/29/2013   MCV 68.8* 05/29/2013   PLT 230 05/29/2013   Lab Results  Component Value Date   CREATININE 0.89 05/29/2013   BUN 11 05/29/2013   NA 139 05/29/2013   K 4.0 05/29/2013   CL 103 05/29/2013   CO2 29 05/29/2013   Lab Results  Component Value Date   ALT 22 05/29/2013   AST 20 05/29/2013   ALKPHOS 88 05/29/2013   BILITOT 0.4 05/29/2013   Lab Results  Component Value Date   CHOL 168 05/29/2013   Lab Results  Component Value Date   HDL 54 05/29/2013   Lab Results  Component Value Date   LDLCALC 94 05/29/2013   Lab Results  Component Value Date   TRIG 99 05/29/2013   Lab Results  Component Value Date   CHOLHDL 3.1 05/29/2013     Assessment & Plan  HYPERTENSION Well controlled, no changes  ASTHMA No recent flares  Diabetes mellitus type 2, controlled Well controlled, avoid simple carbs  GERD Avoid offending foods, continue probiotic and use Protonix prn

## 2013-09-06 NOTE — Assessment & Plan Note (Signed)
No recent flares 

## 2013-09-06 NOTE — Assessment & Plan Note (Signed)
Avoid offending foods, continue probiotic and use Protonix prn

## 2013-09-06 NOTE — Assessment & Plan Note (Signed)
Well controlled, no changes 

## 2013-09-07 ENCOUNTER — Telehealth: Payer: Self-pay | Admitting: Family Medicine

## 2013-09-07 NOTE — Telephone Encounter (Signed)
Lab order placed.

## 2013-09-07 NOTE — Telephone Encounter (Signed)
Relevant patient education assigned to patient using Emmi. ° °

## 2013-09-08 ENCOUNTER — Ambulatory Visit: Payer: Medicare HMO | Admitting: Rehabilitation

## 2013-09-09 ENCOUNTER — Telehealth: Payer: Self-pay

## 2013-09-09 NOTE — Telephone Encounter (Signed)
Relevant patient education assigned to patient using Emmi. ° °

## 2013-09-15 ENCOUNTER — Ambulatory Visit: Payer: Medicare HMO | Admitting: Physical Therapy

## 2013-09-17 LAB — CBC
HCT: 41.7 % (ref 36.0–46.0)
Hemoglobin: 12.8 g/dL (ref 12.0–15.0)
MCH: 21.1 pg — ABNORMAL LOW (ref 26.0–34.0)
MCHC: 30.7 g/dL (ref 30.0–36.0)
MCV: 68.8 fL — ABNORMAL LOW (ref 78.0–100.0)
Platelets: 226 10*3/uL (ref 150–400)
RBC: 6.06 MIL/uL — ABNORMAL HIGH (ref 3.87–5.11)
RDW: 17.5 % — ABNORMAL HIGH (ref 11.5–15.5)
WBC: 4.5 10*3/uL (ref 4.0–10.5)

## 2013-09-17 LAB — LIPID PANEL
Cholesterol: 144 mg/dL (ref 0–200)
HDL: 57 mg/dL (ref >39)
LDL Cholesterol: 65 mg/dL (ref 0–99)
Total CHOL/HDL Ratio: 2.5 Ratio
Triglycerides: 108 mg/dL (ref <150)
VLDL: 22 mg/dL (ref 0–40)

## 2013-09-17 LAB — HEPATIC FUNCTION PANEL
ALT: 23 U/L (ref 0–35)
AST: 22 U/L (ref 0–37)
Albumin: 4.2 g/dL (ref 3.5–5.2)
Alkaline Phosphatase: 82 U/L (ref 39–117)
Bilirubin, Direct: 0.1 mg/dL (ref 0.0–0.3)
Indirect Bilirubin: 0.3 mg/dL (ref 0.2–1.2)
Total Bilirubin: 0.4 mg/dL (ref 0.2–1.2)
Total Protein: 6.8 g/dL (ref 6.0–8.3)

## 2013-09-17 LAB — RENAL FUNCTION PANEL
Albumin: 4.2 g/dL (ref 3.5–5.2)
BUN: 14 mg/dL (ref 6–23)
CO2: 27 mEq/L (ref 19–32)
Calcium: 9.2 mg/dL (ref 8.4–10.5)
Chloride: 102 mEq/L (ref 96–112)
Creat: 0.76 mg/dL (ref 0.50–1.10)
Glucose, Bld: 88 mg/dL (ref 70–99)
Phosphorus: 4 mg/dL (ref 2.3–4.6)
Potassium: 4.1 mEq/L (ref 3.5–5.3)
Sodium: 140 mEq/L (ref 135–145)

## 2013-09-17 LAB — HEMOGLOBIN A1C
Hgb A1c MFr Bld: 6.3 % — ABNORMAL HIGH (ref <5.7)
Mean Plasma Glucose: 134 mg/dL — ABNORMAL HIGH (ref <117)

## 2013-09-17 LAB — TSH: TSH: 5.36 u[IU]/mL — ABNORMAL HIGH (ref 0.350–4.500)

## 2013-09-21 NOTE — Addendum Note (Signed)
Addended by: Varney Daily on: 09/21/2013 11:36 AM   Modules accepted: Orders

## 2013-09-22 ENCOUNTER — Ambulatory Visit: Payer: Medicare HMO | Attending: Physical Medicine and Rehabilitation | Admitting: Rehabilitation

## 2013-09-22 DIAGNOSIS — R609 Edema, unspecified: Secondary | ICD-10-CM | POA: Insufficient documentation

## 2013-09-22 DIAGNOSIS — IMO0001 Reserved for inherently not codable concepts without codable children: Secondary | ICD-10-CM | POA: Insufficient documentation

## 2013-09-22 DIAGNOSIS — M25569 Pain in unspecified knee: Secondary | ICD-10-CM | POA: Insufficient documentation

## 2013-09-22 DIAGNOSIS — R269 Unspecified abnormalities of gait and mobility: Secondary | ICD-10-CM | POA: Insufficient documentation

## 2013-09-22 DIAGNOSIS — M25669 Stiffness of unspecified knee, not elsewhere classified: Secondary | ICD-10-CM | POA: Insufficient documentation

## 2013-09-22 DIAGNOSIS — M6281 Muscle weakness (generalized): Secondary | ICD-10-CM | POA: Insufficient documentation

## 2013-09-29 ENCOUNTER — Ambulatory Visit: Payer: Medicare HMO | Admitting: Physical Therapy

## 2013-10-12 ENCOUNTER — Encounter: Payer: Self-pay | Admitting: Family Medicine

## 2013-10-13 ENCOUNTER — Encounter: Payer: Self-pay | Admitting: Family Medicine

## 2013-10-13 MED ORDER — LEVOTHYROXINE SODIUM 25 MCG PO TABS: 25.0000 ug | ORAL_TABLET | ORAL | Status: DC

## 2013-10-13 NOTE — Telephone Encounter (Signed)
I will handle the refill as soon as I know where patient would like this sent.  Please see the other information

## 2013-10-29 ENCOUNTER — Other Ambulatory Visit: Payer: Self-pay

## 2013-11-27 LAB — HEPATIC FUNCTION PANEL
ALT: 22 U/L (ref 0–35)
AST: 20 U/L (ref 0–37)
Albumin: 3.8 g/dL (ref 3.5–5.2)
Alkaline Phosphatase: 84 U/L (ref 39–117)
BILIRUBIN DIRECT: 0.1 mg/dL (ref 0.0–0.3)
Indirect Bilirubin: 0.2 mg/dL (ref 0.2–1.2)
Total Bilirubin: 0.3 mg/dL (ref 0.2–1.2)
Total Protein: 6.8 g/dL (ref 6.0–8.3)

## 2013-11-27 LAB — CBC
HEMATOCRIT: 39.9 % (ref 36.0–46.0)
Hemoglobin: 12.5 g/dL (ref 12.0–15.0)
MCH: 21.2 pg — ABNORMAL LOW (ref 26.0–34.0)
MCHC: 31.3 g/dL (ref 30.0–36.0)
MCV: 67.5 fL — ABNORMAL LOW (ref 78.0–100.0)
PLATELETS: 252 10*3/uL (ref 150–400)
RBC: 5.91 MIL/uL — ABNORMAL HIGH (ref 3.87–5.11)
RDW: 16.3 % — AB (ref 11.5–15.5)
WBC: 5.3 10*3/uL (ref 4.0–10.5)

## 2013-11-27 LAB — HEMOGLOBIN A1C
Hgb A1c MFr Bld: 6.6 % — ABNORMAL HIGH (ref <5.7)
Mean Plasma Glucose: 143 mg/dL — ABNORMAL HIGH (ref <117)

## 2013-11-27 LAB — RENAL FUNCTION PANEL
ALBUMIN: 3.8 g/dL (ref 3.5–5.2)
BUN: 9 mg/dL (ref 6–23)
CHLORIDE: 104 meq/L (ref 96–112)
CO2: 28 mEq/L (ref 19–32)
Calcium: 9.1 mg/dL (ref 8.4–10.5)
Creat: 0.79 mg/dL (ref 0.50–1.10)
Glucose, Bld: 99 mg/dL (ref 70–99)
Phosphorus: 3.7 mg/dL (ref 2.3–4.6)
Potassium: 4.3 mEq/L (ref 3.5–5.3)
Sodium: 141 mEq/L (ref 135–145)

## 2013-11-27 LAB — LIPID PANEL
Cholesterol: 143 mg/dL (ref 0–200)
HDL: 50 mg/dL (ref >39)
LDL Cholesterol: 72 mg/dL (ref 0–99)
TRIGLYCERIDES: 105 mg/dL (ref <150)
Total CHOL/HDL Ratio: 2.9 Ratio
VLDL: 21 mg/dL (ref 0–40)

## 2013-11-28 LAB — TSH: TSH: 3.503 u[IU]/mL (ref 0.350–4.500)

## 2013-12-07 ENCOUNTER — Ambulatory Visit (INDEPENDENT_AMBULATORY_CARE_PROVIDER_SITE_OTHER): Payer: Commercial Managed Care - HMO | Admitting: Family Medicine

## 2013-12-07 ENCOUNTER — Other Ambulatory Visit (HOSPITAL_COMMUNITY)
Admission: RE | Admit: 2013-12-07 | Discharge: 2013-12-07 | Disposition: A | Discharge status: Home / Self Care | Payer: Medicare HMO | Source: Ambulatory Visit | Attending: Family Medicine | Admitting: Family Medicine

## 2013-12-07 ENCOUNTER — Encounter: Payer: Self-pay | Admitting: Family Medicine

## 2013-12-07 VITALS — BP 112/84 | HR 79 | Temp 98.2°F | Ht 62.0 in | Wt 252.0 lb

## 2013-12-07 DIAGNOSIS — E119 Type 2 diabetes mellitus without complications: Secondary | ICD-10-CM

## 2013-12-07 DIAGNOSIS — Z0000 Encounter for general adult medical examination without abnormal findings: Secondary | ICD-10-CM

## 2013-12-07 DIAGNOSIS — Z01419 Encounter for gynecological examination (general) (routine) without abnormal findings: Secondary | ICD-10-CM | POA: Insufficient documentation

## 2013-12-07 DIAGNOSIS — I8310 Varicose veins of unspecified lower extremity with inflammation: Secondary | ICD-10-CM

## 2013-12-07 DIAGNOSIS — E785 Hyperlipidemia, unspecified: Secondary | ICD-10-CM

## 2013-12-07 DIAGNOSIS — M793 Panniculitis, unspecified: Secondary | ICD-10-CM | POA: Insufficient documentation

## 2013-12-07 DIAGNOSIS — Z124 Encounter for screening for malignant neoplasm of cervix: Secondary | ICD-10-CM

## 2013-12-07 DIAGNOSIS — E669 Obesity, unspecified: Secondary | ICD-10-CM

## 2013-12-07 DIAGNOSIS — I10 Essential (primary) hypertension: Secondary | ICD-10-CM

## 2013-12-07 HISTORY — DX: Varicose veins of unspecified lower extremity with inflammation: I83.10

## 2013-12-07 HISTORY — DX: Panniculitis, unspecified: M79.3

## 2013-12-07 NOTE — Patient Instructions (Signed)

## 2013-12-07 NOTE — Progress Notes (Signed)
Pre visit review using our clinic review tool, if applicable. No additional management support is needed unless otherwise documented below in the visit note. 

## 2013-12-07 NOTE — Assessment & Plan Note (Signed)
Pap today, no concerns on exam.  

## 2013-12-09 ENCOUNTER — Encounter: Payer: Self-pay | Admitting: Family Medicine

## 2013-12-09 DIAGNOSIS — Z0000 Encounter for general adult medical examination without abnormal findings: Secondary | ICD-10-CM | POA: Insufficient documentation

## 2013-12-09 NOTE — Assessment & Plan Note (Signed)
Patient encouraged to maintain heart healthy diet, regular exercise, adequate sleep. Consider daily probiotics. Take medications as prescribed 

## 2013-12-09 NOTE — Assessment & Plan Note (Signed)
Tolerating statin, encouraged heart healthy diet, avoid trans fats, minimize simple carbs and saturated fats. Increase exercise as tolerated 

## 2013-12-09 NOTE — Assessment & Plan Note (Signed)
Encouraged DASH diet, decrease po intake and increase exercise as tolerated. Needs 7-8 hours of sleep nightly. Avoid trans fats, eat small, frequent meals every 4-5 hours with lean proteins, complex carbs and healthy fats. Minimize simple carbs, GMO foods. 

## 2013-12-09 NOTE — Progress Notes (Signed)
Patient ID: Katelyn Fisher, female   DOB: Sep 09, 1950, 63 y.o.   MRN: [id number] Katelyn Fisher [id number] 07-25-1950 12/09/2013      Progress Note-Follow Up  Subjective  Chief Complaint  Chief Complaint  Patient presents with  . Annual Exam    physical  . Gynecologic Exam    pap    HPI  Patient is a 63 year old 43 in today for routine medical care. Generally doing well. Denies any GYN complaints. He is struggling with rash on the sides of her legs which she follows through dermatology. She has a long standing diagnosis of lipodermatosclerosis. Denies CP/palp/SOB/HA/congestion/fevers/GI or GU c/o. Taking meds as prescribed  Past Medical History  Diagnosis Date  . Asthma   . Hypertension   . Obesity   . Hx of colonic polyps   . GERD (gastroesophageal reflux disease)   . History of colonic diverticulitis   . Arthritis     back  . Hyperlipidemia   . Depression   . Low back pain radiating to right leg 08/18/2012  . Anxiety   . Benign paroxysmal positional vertigo 11/01/2012  . Thyroid disease 06/01/2013  . Cervical cancer screening 10/09/2010    Qualifier: Diagnosis of  By: Nena Jordan   . 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  . Preventative health care 12/09/2013    Past Surgical History  Procedure Laterality Date  . Cholecystectomy  1991  . Dilation and curettage of uterus    . Tubal ligation    . Appendectomy      Family History  Problem Relation Age of Onset  . Heart disease Maternal Aunt     x 3 -2 brothers  . Hypertension Maternal Aunt   . Cancer Maternal Aunt     2 maternal aunts  . Prostate cancer Father   . Diabetes Father   . Cancer Father     lung cancer, asbestos exposure  . Heart disease Brother   . Diabetes Brother   . Other      no FH colon cancer  . Diabetes Mother   . Lupus Mother   . Heart disease Brother   . Hyperlipidemia Sister   . Heart disease Brother   . Diabetes Brother   . Heart disease Brother   . Diabetes Brother      History   Social History  . Marital Status: Married    Spouse Name: N/A    Number of Children: N/A  . Years of Education: N/A   Occupational History  . Not on file.   Social History Main Topics  . Smoking status: Never Smoker   . Smokeless tobacco: Never Used  . Alcohol Use: No  . Drug Use: No  . Sexual Activity: Yes     Comment: lives with husband, no dietary restrictions   Other Topics Concern  . Not on file   Social History Narrative   Married- 40 years   Never Smoked   Alcohol use-no   Drug use-no   Daily Caffeine Use   Occupation: housewife   Caffeine use/day:  None   Does Patient Exercise:  yes    Current Outpatient Prescriptions on File Prior to Visit  Medication Sig Dispense Refill  . albuterol (PROAIR HFA) 108 (90 BASE) MCG/ACT inhaler Inhale 2 puffs into the lungs 2 (two) times daily as needed.  3 Inhaler  2  . albuterol (PROVENTIL) (2.5 MG/3ML) 0.083% nebulizer solution Take 3 mLs (2.5 mg total) by nebulization every 6 (six) hours as  needed for wheezing or shortness of breath.  150 mL  1  . amLODipine (NORVASC) 10 MG tablet Take 1 tablet (10 mg total) by mouth daily.  90 tablet  2  . aspirin 81 MG tablet Take 81 mg by mouth daily.        Marland Kitchen atorvastatin (LIPITOR) 20 MG tablet Take 0.5 tablets (10 mg total) by mouth daily.  90 tablet  2  . b complex vitamins capsule Take 1 capsule by mouth daily.        . benazepril (LOTENSIN) 40 MG tablet Take 1 tablet (40 mg total) by mouth daily.  90 tablet  2  . budesonide-formoterol (SYMBICORT) 160-4.5 MCG/ACT inhaler Inhale 2 puffs into the lungs 2 (two) times daily.  3 Inhaler  2  . diclofenac sodium (VOLTAREN) 1 % GEL Apply topically.      . fluticasone (FLONASE) 50 MCG/ACT nasal spray Place 2 sprays into the nose daily.  16 g  0  . furosemide (LASIX) 20 MG tablet Take 1 tablet (20 mg total) by mouth daily.  90 tablet  2  . HYDROcodone-homatropine (HYCODAN) 5-1.5 MG/5ML syrup Take 5 mLs by mouth every 8 (eight)  hours as needed for cough.  120 mL  0  . levothyroxine (SYNTHROID, LEVOTHROID) 25 MCG tablet Take 1 tablet (25 mcg total) by mouth as directed. 1 tab daily except on Saturdays take 2 tabs  102 tablet  1  . meloxicam (MOBIC) 15 MG tablet Take 15 mg by mouth daily.      . montelukast (SINGULAIR) 10 MG tablet Take 1 tablet (10 mg total) by mouth at bedtime as needed.  30 tablet  3  . Multiple Vitamin (MULTIVITAMIN) tablet Take 1 tablet by mouth daily.      . nebivolol (BYSTOLIC) 10 MG tablet Take 1 tablet (10 mg total) by mouth 2 (two) times daily.  180 tablet  1  . pantoprazole (PROTONIX) 40 MG tablet Take 1 tablet (40 mg total) by mouth daily.  90 tablet  2  . pentoxifylline (TRENTAL) 400 MG CR tablet Take 400 mg by mouth 3 (three) times daily with meals.        . Probiotic Product (ALIGN) 4 MG CAPS Take by mouth.      . traMADol (ULTRAM) 50 MG tablet Take 50 mg by mouth every 6 (six) hours as needed.        . triamcinolone (KENALOG) 0.1 % cream Apply 1 application topically. Apply topically to affected area once daily        No current facility-administered medications on file prior to visit.    Allergies  Allergen Reactions  . Shellfish Allergy Hives, Shortness Of Breath and Swelling  . Iodine     Rash  . Penicillins     REACTION: unspecified    Review of Systems  Review of Systems  Constitutional: Negative for fever, chills and malaise/fatigue.  HENT: Negative for congestion, hearing loss and nosebleeds.   Eyes: Negative for discharge.  Respiratory: Negative for cough, sputum production, shortness of breath and wheezing.   Cardiovascular: Negative for chest pain, palpitations and leg swelling.  Gastrointestinal: Negative for heartburn, nausea, vomiting, abdominal pain, diarrhea, constipation and blood in stool.  Genitourinary: Negative for dysuria, urgency, frequency and hematuria.  Musculoskeletal: Negative for back pain, falls and myalgias.  Skin: Negative for rash.   Neurological: Negative for dizziness, tremors, sensory change, focal weakness, loss of consciousness, weakness and headaches.  Endo/Heme/Allergies: Negative for polydipsia. Does not bruise/bleed easily.  Psychiatric/Behavioral: Negative for depression and suicidal ideas. The patient is not nervous/anxious and does not have insomnia.     Objective  BP 112/84  Pulse 79  Temp(Src) 98.2 F (36.8 C) (Oral)  Ht 5\' 2"  (1.575 m)  Wt 252 lb (114.306 kg)  BMI 46.08 kg/m2  SpO2 99%  Physical Exam  Physical Exam  Constitutional: She is oriented to person, place, and time and well-developed, well-nourished, and in no distress. No distress.  HENT:  Head: Normocephalic and atraumatic.  Right Ear: External ear normal.  Left Ear: External ear normal.  Nose: Nose normal.  Mouth/Throat: Oropharynx is clear and moist. No oropharyngeal exudate.  Eyes: Conjunctivae are normal. Pupils are equal, round, and reactive to light. Right eye exhibits no discharge. Left eye exhibits no discharge. No scleral icterus.  Neck: Normal range of motion. Neck supple. No thyromegaly present.  Cardiovascular: Normal rate, regular rhythm, normal heart sounds and intact distal pulses.   No murmur heard. Pulmonary/Chest: Effort normal and breath sounds normal. No respiratory distress. She has no wheezes. She has no rales.  Abdominal: Soft. Bowel sounds are normal. She exhibits no distension and no mass. There is no tenderness.  Genitourinary: Vagina normal, uterus normal, cervix normal, right adnexa normal and left adnexa normal. No vaginal discharge found.  Musculoskeletal: Normal range of motion. She exhibits no edema and no tenderness.  Lymphadenopathy:    She has no cervical adenopathy.  Neurological: She is alert and oriented to person, place, and time. She has normal reflexes. No cranial nerve deficit. Coordination normal.  Skin: Skin is warm and dry. No rash noted. She is not diaphoretic.  Psychiatric: Mood,  memory and affect normal.    Lab Results  Component Value Date   TSH 3.503 11/27/2013   Lab Results  Component Value Date   WBC 5.3 11/27/2013   HGB 12.5 11/27/2013   HCT 39.9 11/27/2013   MCV 67.5* 11/27/2013   PLT 252 11/27/2013   Lab Results  Component Value Date   CREATININE 0.79 11/27/2013   BUN 9 11/27/2013   NA 141 11/27/2013   K 4.3 11/27/2013   CL 104 11/27/2013   CO2 28 11/27/2013   Lab Results  Component Value Date   ALT 22 11/27/2013   AST 20 11/27/2013   ALKPHOS 84 11/27/2013   BILITOT 0.3 11/27/2013   Lab Results  Component Value Date   CHOL 143 11/27/2013   Lab Results  Component Value Date   HDL 50 11/27/2013   Lab Results  Component Value Date   LDLCALC 72 11/27/2013   Lab Results  Component Value Date   TRIG 105 11/27/2013   Lab Results  Component Value Date   CHOLHDL 2.9 11/27/2013     Assessment & Plan  Cervical cancer screening Pap today, no concerns on exam.   Lipodermatosclerosis Follows with dermatology  HYPERTENSION Well controlled, no changes to meds. Encouraged heart healthy diet such as the DASH diet and exercise as tolerated.   Preventative health care Patient encouraged to maintain heart healthy diet, regular exercise, adequate sleep. Consider daily probiotics. Take medications as prescribed  Hyperlipidemia Tolerating statin, encouraged heart healthy diet, avoid trans fats, minimize simple carbs and saturated fats. Increase exercise as tolerated  Diabetes mellitus type 2, controlled hgba1c acceptable, minimize simple carbs. Increase exercise as tolerated. Continue current meds  OBESITY, UNSPECIFIED Encouraged DASH diet, decrease po intake and increase exercise as tolerated. Needs 7-8 hours of sleep nightly. Avoid trans fats, eat small, frequent meals  every 4-5 hours with lean proteins, complex carbs and healthy fats. Minimize simple carbs, GMO foods.

## 2013-12-09 NOTE — Assessment & Plan Note (Signed)
Follows with dermatology 

## 2013-12-09 NOTE — Assessment & Plan Note (Signed)
Well controlled, no changes to meds. Encouraged heart healthy diet such as the DASH diet and exercise as tolerated.  °

## 2013-12-09 NOTE — Assessment & Plan Note (Signed)
hgba1c acceptable, minimize simple carbs. Increase exercise as tolerated. Continue current meds 

## 2014-01-13 ENCOUNTER — Encounter: Payer: Self-pay | Admitting: Family Medicine

## 2014-01-14 ENCOUNTER — Other Ambulatory Visit: Payer: Self-pay | Admitting: Family Medicine

## 2014-01-14 DIAGNOSIS — J45909 Unspecified asthma, uncomplicated: Secondary | ICD-10-CM

## 2014-02-05 ENCOUNTER — Telehealth: Payer: Self-pay | Admitting: Family Medicine

## 2014-02-05 DIAGNOSIS — M25569 Pain in unspecified knee: Secondary | ICD-10-CM

## 2014-02-05 DIAGNOSIS — Z0000 Encounter for general adult medical examination without abnormal findings: Secondary | ICD-10-CM

## 2014-02-05 NOTE — Telephone Encounter (Signed)
Pt states she has been seeing Dr Myrna Blazer for her right leg and right knee pain. States she called them to arrange an appt as the pain is flaring up again but she now has [facility] and was told she will have to have a referral.   Pt also requests referral for annual eye exam to Dr Leandra Kern.  Advised pt she would be notified when referrals have been placed.

## 2014-02-05 NOTE — Telephone Encounter (Signed)
Patient called in requesting referral to the following doctors:  Dr. Wannetta Sender at [facility]  Dr. Leandra Kern eye in [facility]

## 2014-02-07 NOTE — Telephone Encounter (Signed)
Referrals have been placed.  

## 2014-03-08 ENCOUNTER — Ambulatory Visit: Payer: Commercial Managed Care - HMO | Admitting: Family Medicine

## 2014-03-15 ENCOUNTER — Ambulatory Visit: Payer: Commercial Managed Care - HMO | Admitting: Family Medicine

## 2014-03-16 ENCOUNTER — Telehealth: Payer: Self-pay

## 2014-03-16 DIAGNOSIS — E785 Hyperlipidemia, unspecified: Secondary | ICD-10-CM

## 2014-03-16 DIAGNOSIS — E079 Disorder of thyroid, unspecified: Secondary | ICD-10-CM

## 2014-03-16 DIAGNOSIS — E119 Type 2 diabetes mellitus without complications: Secondary | ICD-10-CM

## 2014-03-16 DIAGNOSIS — I10 Essential (primary) hypertension: Secondary | ICD-10-CM

## 2014-03-16 LAB — TSH: TSH: 2.562 u[IU]/mL (ref 0.350–4.500)

## 2014-03-16 LAB — HEMOGLOBIN A1C
Hgb A1c MFr Bld: 7 % — ABNORMAL HIGH (ref <5.7)
MEAN PLASMA GLUCOSE: 154 mg/dL — AB (ref <117)

## 2014-03-16 LAB — CBC
HCT: 40 % (ref 36.0–46.0)
Hemoglobin: 12.2 g/dL (ref 12.0–15.0)
MCH: 20.8 pg — ABNORMAL LOW (ref 26.0–34.0)
MCHC: 30.5 g/dL (ref 30.0–36.0)
MCV: 68.3 fL — ABNORMAL LOW (ref 78.0–100.0)
Platelets: 231 10*3/uL (ref 150–400)
RBC: 5.86 MIL/uL — ABNORMAL HIGH (ref 3.87–5.11)
RDW: 16.5 % — ABNORMAL HIGH (ref 11.5–15.5)
WBC: 4.3 10*3/uL (ref 4.0–10.5)

## 2014-03-16 NOTE — Telephone Encounter (Signed)
Pt presented in lab.  Lab order placed

## 2014-03-17 LAB — RENAL FUNCTION PANEL
Albumin: 4 g/dL (ref 3.5–5.2)
BUN: 9 mg/dL (ref 6–23)
CALCIUM: 9 mg/dL (ref 8.4–10.5)
CHLORIDE: 105 meq/L (ref 96–112)
CO2: 26 mEq/L (ref 19–32)
CREATININE: 0.84 mg/dL (ref 0.50–1.10)
Glucose, Bld: 119 mg/dL — ABNORMAL HIGH (ref 70–99)
PHOSPHORUS: 3.8 mg/dL (ref 2.3–4.6)
POTASSIUM: 4.3 meq/L (ref 3.5–5.3)
Sodium: 140 mEq/L (ref 135–145)

## 2014-03-17 LAB — HEPATIC FUNCTION PANEL
ALBUMIN: 4 g/dL (ref 3.5–5.2)
ALK PHOS: 81 U/L (ref 39–117)
ALT: 23 U/L (ref 0–35)
AST: 19 U/L (ref 0–37)
Bilirubin, Direct: 0.1 mg/dL (ref 0.0–0.3)
Indirect Bilirubin: 0.3 mg/dL (ref 0.2–1.2)
TOTAL PROTEIN: 6.9 g/dL (ref 6.0–8.3)
Total Bilirubin: 0.4 mg/dL (ref 0.2–1.2)

## 2014-03-17 LAB — LIPID PANEL
CHOL/HDL RATIO: 2.7 ratio
CHOLESTEROL: 125 mg/dL (ref 0–200)
HDL: 47 mg/dL (ref >39)
LDL CALC: 62 mg/dL (ref 0–99)
TRIGLYCERIDES: 82 mg/dL (ref <150)
VLDL: 16 mg/dL (ref 0–40)

## 2014-03-18 ENCOUNTER — Encounter: Payer: Self-pay | Admitting: Family Medicine

## 2014-03-18 ENCOUNTER — Ambulatory Visit (INDEPENDENT_AMBULATORY_CARE_PROVIDER_SITE_OTHER): Payer: Commercial Managed Care - HMO | Admitting: Family Medicine

## 2014-03-18 ENCOUNTER — Telehealth: Payer: Self-pay | Admitting: Family Medicine

## 2014-03-18 VITALS — BP 120/82 | HR 71 | Temp 98.1°F | Ht 62.0 in | Wt 253.0 lb

## 2014-03-18 DIAGNOSIS — E785 Hyperlipidemia, unspecified: Secondary | ICD-10-CM

## 2014-03-18 DIAGNOSIS — I10 Essential (primary) hypertension: Secondary | ICD-10-CM

## 2014-03-18 DIAGNOSIS — K219 Gastro-esophageal reflux disease without esophagitis: Secondary | ICD-10-CM

## 2014-03-18 DIAGNOSIS — E119 Type 2 diabetes mellitus without complications: Secondary | ICD-10-CM

## 2014-03-18 DIAGNOSIS — E079 Disorder of thyroid, unspecified: Secondary | ICD-10-CM

## 2014-03-18 NOTE — Telephone Encounter (Signed)
Yes she needs the same labs at 3 months so I can watch her sugar

## 2014-03-18 NOTE — Assessment & Plan Note (Signed)
Well controlled, no changes to meds. Encouraged heart healthy diet such as the DASH diet and exercise as tolerated.  °

## 2014-03-18 NOTE — Telephone Encounter (Signed)
Please advise 

## 2014-03-18 NOTE — Progress Notes (Signed)
Pre visit review using our clinic review tool, if applicable. No additional management support is needed unless otherwise documented below in the visit note. 

## 2014-03-18 NOTE — Telephone Encounter (Signed)
Please inform patient that MD would like her to get labs in Nov to watch her sugar. Labs ordered and pt needs to schedule appt for labs

## 2014-03-18 NOTE — Patient Instructions (Signed)

## 2014-03-18 NOTE — Assessment & Plan Note (Signed)
Avoid offending foods, start probiotics. Do not eat large meals in late evening and consider raising head of bed.  

## 2014-03-18 NOTE — Telephone Encounter (Signed)
Patient scheduled 3 month follow up in November and cpe next year(pt is not due until may for cpe,per ins). Patient wants to know if she still needs to do labs prior to 3 month follow up in November? I did schedule patient lab appt prior to cpe. Thanks!

## 2014-03-19 NOTE — Telephone Encounter (Signed)
Letter sent through mychart.

## 2014-03-21 ENCOUNTER — Encounter: Payer: Self-pay | Admitting: Family Medicine

## 2014-03-21 NOTE — Assessment & Plan Note (Signed)
Tolerating statin, encouraged heart healthy diet, avoid trans fats, minimize simple carbs and saturated fats. Increase exercise as tolerated 

## 2014-03-21 NOTE — Assessment & Plan Note (Signed)
hgba1c acceptable, minimize simple carbs. Increase exercise as tolerated. Continue current meds 

## 2014-03-21 NOTE — Progress Notes (Signed)
Patient ID: Katelyn Fisher, female   DOB: 04/01/1951, 63 y.o.   MRN: [id number] Katelyn Fisher [id number] 12-11-1950 03/21/2014      Progress Note-Follow Up  Subjective  Chief Complaint  Chief Complaint  Patient presents with  . Follow-up    HPI  Patient is a 63 year old female in today for routine medical care. She is generally well although she has had some trouble right knee pain. Has been following with pain management (loss and physical therapy has been helping has had some steroid injections. She notes in general her energy level is better. She's given up soda and is drinking more water. She's decreased her sodium. No recent illness or acute concerns otherwise. Denies CP/palp/SOB/HA/congestion/fevers/GI or GU c/o. Taking meds as prescribed  Past Medical History  Diagnosis Date  . Asthma   . Hypertension   . Obesity   . Hx of colonic polyps   . GERD (gastroesophageal reflux disease)   . History of colonic diverticulitis   . Arthritis     back  . Hyperlipidemia   . Depression   . Low back pain radiating to right leg 08/18/2012  . Anxiety   . Benign paroxysmal positional vertigo 11/01/2012  . Thyroid disease 06/01/2013  . Cervical cancer screening 10/09/2010    Qualifier: Diagnosis of  By: Wynona Luna   . 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  . Preventative health care 12/09/2013    Past Surgical History  Procedure Laterality Date  . Cholecystectomy  1991  . Dilation and curettage of uterus    . Tubal ligation    . Appendectomy      Family History  Problem Relation Age of Onset  . Heart disease Maternal Aunt     x 3 -2 brothers  . Hypertension Maternal Aunt   . Cancer Maternal Aunt     2 maternal aunts  . Prostate cancer Father   . Diabetes Father   . Cancer Father     lung cancer, asbestos exposure  . Heart disease Brother   . Diabetes Brother   . Other      no FH colon cancer  . Diabetes Mother   . Lupus Mother   . Heart disease Brother   .  Hyperlipidemia Sister   . Heart disease Brother   . Diabetes Brother   . Heart disease Brother   . Diabetes Brother     History   Social History  . Marital Status: Married    Spouse Name: N/A    Number of Children: N/A  . Years of Education: N/A   Occupational History  . Not on file.   Social History Main Topics  . Smoking status: Never Smoker   . Smokeless tobacco: Never Used  . Alcohol Use: No  . Drug Use: No  . Sexual Activity: Yes     Comment: lives with husband, no dietary restrictions   Other Topics Concern  . Not on file   Social History Narrative   Married- 40 years   Never Smoked   Alcohol use-no   Drug use-no   Daily Caffeine Use   Occupation: housewife   Caffeine use/day:  None   Does Patient Exercise:  yes    Current Outpatient Prescriptions on File Prior to Visit  Medication Sig Dispense Refill  . albuterol (PROAIR HFA) 108 (90 BASE) MCG/ACT inhaler Inhale 2 puffs into the lungs 2 (two) times daily as needed.  3 Inhaler  2  . albuterol (PROVENTIL) (2.5 MG/3ML)  0.083% nebulizer solution Take 3 mLs (2.5 mg total) by nebulization every 6 (six) hours as needed for wheezing or shortness of breath.  150 mL  1  . amLODipine (NORVASC) 10 MG tablet Take 1 tablet (10 mg total) by mouth daily.  90 tablet  2  . aspirin 81 MG tablet Take 81 mg by mouth daily.        Marland Kitchen atorvastatin (LIPITOR) 20 MG tablet Take 0.5 tablets (10 mg total) by mouth daily.  90 tablet  2  . b complex vitamins capsule Take 1 capsule by mouth daily.        . benazepril (LOTENSIN) 40 MG tablet Take 1 tablet (40 mg total) by mouth daily.  90 tablet  2  . budesonide-formoterol (SYMBICORT) 160-4.5 MCG/ACT inhaler Inhale 2 puffs into the lungs 2 (two) times daily.  3 Inhaler  2  . diclofenac sodium (VOLTAREN) 1 % GEL Apply topically.      . fluticasone (FLONASE) 50 MCG/ACT nasal spray Place 2 sprays into the nose daily.  16 g  0  . furosemide (LASIX) 20 MG tablet Take 1 tablet (20 mg total) by  mouth daily.  90 tablet  2  . HYDROcodone-homatropine (HYCODAN) 5-1.5 MG/5ML syrup Take 5 mLs by mouth every 8 (eight) hours as needed for cough.  120 mL  0  . levothyroxine (SYNTHROID, LEVOTHROID) 25 MCG tablet Take 1 tablet (25 mcg total) by mouth as directed. 1 tab daily except on Saturdays take 2 tabs  102 tablet  1  . meloxicam (MOBIC) 15 MG tablet Take 15 mg by mouth daily.      . montelukast (SINGULAIR) 10 MG tablet Take 1 tablet (10 mg total) by mouth at bedtime as needed.  30 tablet  3  . Multiple Vitamin (MULTIVITAMIN) tablet Take 1 tablet by mouth daily.      . nebivolol (BYSTOLIC) 10 MG tablet Take 1 tablet (10 mg total) by mouth 2 (two) times daily.  180 tablet  1  . pantoprazole (PROTONIX) 40 MG tablet Take 1 tablet (40 mg total) by mouth daily.  90 tablet  2  . pentoxifylline (TRENTAL) 400 MG CR tablet Take 400 mg by mouth 3 (three) times daily with meals.        . Probiotic Product (ALIGN) 4 MG CAPS Take by mouth.      . traMADol (ULTRAM) 50 MG tablet Take 50 mg by mouth every 6 (six) hours as needed.        . triamcinolone (KENALOG) 0.1 % cream Apply 1 application topically. Apply topically to affected area once daily        No current facility-administered medications on file prior to visit.    Allergies  Allergen Reactions  . Shellfish Allergy Hives, Shortness Of Breath and Swelling  . Iodine     Rash  . Penicillins     REACTION: unspecified    Review of Systems  Review of Systems  Constitutional: Negative for fever and malaise/fatigue.  HENT: Negative for congestion.   Eyes: Negative for discharge.  Respiratory: Negative for shortness of breath.   Cardiovascular: Negative for chest pain, palpitations and leg swelling.  Gastrointestinal: Negative for nausea, abdominal pain and diarrhea.  Genitourinary: Negative for dysuria.  Musculoskeletal: Positive for joint pain. Negative for falls.       Right knee pain  Skin: Negative for rash.  Neurological: Negative for  loss of consciousness and headaches.  Endo/Heme/Allergies: Negative for polydipsia.  Psychiatric/Behavioral: Negative for depression and  suicidal ideas. The patient is not nervous/anxious and does not have insomnia.     Objective  BP 120/82  Pulse 71  Temp(Src) 98.1 F (36.7 C) (Oral)  Ht 5\' 2"  (1.575 m)  Wt 253 lb 0.6 oz (114.778 kg)  BMI 46.27 kg/m2  SpO2 94%  Physical Exam  Physical Exam  Constitutional: She is oriented to person, place, and time and well-developed, well-nourished, and in no distress. No distress.  HENT:  Head: Normocephalic and atraumatic.  Eyes: Conjunctivae are normal.  Neck: Neck supple. No thyromegaly present.  Cardiovascular: Normal rate, regular rhythm and normal heart sounds.   No murmur heard. Pulmonary/Chest: Effort normal and breath sounds normal. She has no wheezes.  Abdominal: She exhibits no distension and no mass.  Musculoskeletal: She exhibits no edema.  Lymphadenopathy:    She has no cervical adenopathy.  Neurological: She is alert and oriented to person, place, and time.  Skin: Skin is warm and dry. No rash noted. She is not diaphoretic.  Psychiatric: Memory, affect and judgment normal.    Lab Results  Component Value Date   TSH 2.562 03/16/2014   Lab Results  Component Value Date   WBC 4.3 03/16/2014   HGB 12.2 03/16/2014   HCT 40.0 03/16/2014   MCV 68.3* 03/16/2014   PLT 231 03/16/2014   Lab Results  Component Value Date   CREATININE 0.84 03/16/2014   BUN 9 03/16/2014   NA 140 03/16/2014   K 4.3 03/16/2014   CL 105 03/16/2014   CO2 26 03/16/2014   Lab Results  Component Value Date   ALT 23 03/16/2014   AST 19 03/16/2014   ALKPHOS 81 03/16/2014   BILITOT 0.4 03/16/2014   Lab Results  Component Value Date   CHOL 125 03/16/2014   Lab Results  Component Value Date   HDL 47 03/16/2014   Lab Results  Component Value Date   LDLCALC 62 03/16/2014   Lab Results  Component Value Date   TRIG 82 03/16/2014   Lab Results   Component Value Date   CHOLHDL 2.7 03/16/2014     Assessment & Plan  HYPERTENSION Well controlled, no changes to meds. Encouraged heart healthy diet such as the DASH diet and exercise as tolerated.   GERD Avoid offending foods, start probiotics. Do not eat large meals in late evening and consider raising head of bed.   Diabetes mellitus type 2, controlled hgba1c acceptable, minimize simple carbs. Increase exercise as tolerated. Continue current meds.  Hyperlipidemia Tolerating statin, encouraged heart healthy diet, avoid trans fats, minimize simple carbs and saturated fats. Increase exercise as tolerated

## 2014-05-04 ENCOUNTER — Telehealth: Payer: Self-pay | Admitting: Family Medicine

## 2014-05-04 NOTE — Telephone Encounter (Signed)
Last OV 03-18-14 ok for referral?

## 2014-05-04 NOTE — Telephone Encounter (Signed)
Caller name: Berlynn, Warsame A Relation to pt: self  Call back [phone]  Reason for call: pt requesting a referral for GYN Dr. Princess Bruins, MD

## 2014-05-04 NOTE — Telephone Encounter (Signed)
Clarify what she wants a referral for. Basic pap/screen or a particular symptoms? Then I will place referral

## 2014-05-05 ENCOUNTER — Other Ambulatory Visit: Payer: Self-pay | Admitting: Family Medicine

## 2014-05-05 DIAGNOSIS — N950 Postmenopausal bleeding: Secondary | ICD-10-CM

## 2014-05-05 NOTE — Telephone Encounter (Signed)
done

## 2014-05-05 NOTE — Telephone Encounter (Signed)
Spoke with pt. She has not had menses for 10 years and started having vaginal bleeding yesterday. Wearing a panty liner and has some abdominal cramping. Pt states she called Dr Dellis Filbert (regular gyn) and they have her scheduled for u/s on Friday; will see Dr Dellis Filbert on 05/12/14. Pt just wanted to make sure she has referral in place in case insurance requires it.  Please advise.

## 2014-05-10 ENCOUNTER — Encounter: Payer: Self-pay | Admitting: Family Medicine

## 2014-05-11 ENCOUNTER — Encounter: Payer: Self-pay | Admitting: Family Medicine

## 2014-05-17 ENCOUNTER — Telehealth: Payer: Self-pay | Admitting: Family Medicine

## 2014-05-17 NOTE — Telephone Encounter (Signed)
Caller name:  Erza, [place] Relation to pt: SELF  Call back number: [phone]   Reason for call:  pt refuse to state what her message is regarding pt stated it was personal. Pt stated she had a few test and she would like to discuss

## 2014-05-17 NOTE — Telephone Encounter (Signed)
I called patient and she did not want to tell me anything. Patient stated that she had some labs/tests done and would like the provider to call her back to discuss this.  I informed patient that Dr Charlett Blake doesn't usually call patients back because she is in the room with patients.  Pt stated that she would like me to send a note to the provider to see if md would call her

## 2014-05-18 NOTE — Telephone Encounter (Signed)
Just let her know I am unable to get to the phone today but print me this note and I will attempt to call her tomorrow. Confirm correct number

## 2014-05-18 NOTE — Telephone Encounter (Signed)
Patient informed and note printed and put on mds desk

## 2014-05-19 NOTE — Telephone Encounter (Signed)
Spoke with patient by phone. 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 Has an appt with Dr Everitt Amber tomorrow at [facility] to discuss the surgery. She just wanted to let us know how she was doing. She will let us know if there is anything we can do for her.

## 2014-05-20 ENCOUNTER — Ambulatory Visit: Payer: Medicare HMO | Attending: Gynecologic Oncology | Admitting: Gynecologic Oncology

## 2014-05-20 ENCOUNTER — Encounter: Payer: Self-pay | Admitting: Gynecologic Oncology

## 2014-05-20 VITALS — BP 138/82 | HR 75 | Temp 98.2°F | Ht 62.5 in | Wt 245.2 lb

## 2014-05-20 DIAGNOSIS — E079 Disorder of thyroid, unspecified: Secondary | ICD-10-CM | POA: Diagnosis not present

## 2014-05-20 DIAGNOSIS — E785 Hyperlipidemia, unspecified: Secondary | ICD-10-CM | POA: Diagnosis not present

## 2014-05-20 DIAGNOSIS — I10 Essential (primary) hypertension: Secondary | ICD-10-CM | POA: Insufficient documentation

## 2014-05-20 DIAGNOSIS — Z6841 Body Mass Index (BMI) 40.0 and over, adult: Secondary | ICD-10-CM | POA: Diagnosis not present

## 2014-05-20 DIAGNOSIS — C541 Malignant neoplasm of endometrium: Secondary | ICD-10-CM | POA: Insufficient documentation

## 2014-05-20 DIAGNOSIS — Z79899 Other long term (current) drug therapy: Secondary | ICD-10-CM | POA: Diagnosis not present

## 2014-05-20 NOTE — Progress Notes (Signed)
Consult Note: Gyn-Onc  Consult was requested by Dr. Dellis Filbert for the evaluation of Katelyn Fisher 63 y.o. female with "at least grade 1" endometrial adenocarcinoma.   CC: Dr Dellis Filbert, Dr Alycia Rossetti Chief Complaint  Patient presents with  . Fibroids  . Endometrial Biopsy positive for cancer    Assessment/Plan:  Ms. STEPHENY CANAL  is a 28 y.o.  year old woman who is seen in consultation at the request of Dr Dellis Filbert for endometrial cancer. Her pathology describes the lesion as " at least FIGO grade 1 (of 3)".   A detailed discussion was held with the patient and her family with regard to to her endometrial cancer diagnosis. We discussed the standard management options for uterine cancer which includes surgery followed possibly by adjuvant therapy depending on the results of surgery. The options for surgical management include a hysterectomy and removal of the tubes and ovaries possibly with removal of pelvic and para-aortic lymph nodes. A minimally invasive approach including a robotic hysterectomy or laparoscopic hysterectomy have benefits including shorter hospital stay, recovery time and better wound healing. The alternative approach is an open hysterectomy. The patient has been counseled about these surgical options and the risks of surgery in general including infection, bleeding, damage to surrounding structures (including bowel, bladder, ureters, nerves or vessels), and the postoperative risks of PE/ DVT, and lymphedema. I extensively reviewed the additional risks of robotic hysterectomy including possible need for conversion to open laparotomy. I discussed that these risks are higher for her because she is obese.  I discussed positioning during surgery of trendelenberg and risks of minor facial swelling and care we take in preoperative positioning.  After counseling and consideration of her options, she desires to proceed with robotic hysterectomy, BSO, possible lymphadenectomy.   She will be  seen by anesthesia for preoperative clearance and discussion of postoperative pain management.  She was given the opportunity to ask questions, which were answered to her satisfaction, and she is agreement with the above mentioned plan of care.  Her surgery is scheduled for 06/29/14 to be performed by my partner, Dr Nancy Marus. She was offered an earlier surgery date in [place], however preferred to wait for a time for the surgery to be performed locally.   HPI: Katelyn Fisher is a very pleasant 63 year old G2P2 who has asthma, morbid obesity (BMI 44kg/m2) and HTN who has a 2 week history of postmentopausal bleeding. Her menopause was 10 years ago at age 63. The bleeding was light. She sought consultation with her gynecologist, Dr Dellis Filbert who performed a uterine sonogram (results not available at the time of this consultation), which showed "fibroids" per patient, though the specific dimensions are unclear. It also showed a thickened endometrial stripe. 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   Her only prior surgeries are a D&C and hysteroscopic resection of fibroids in 2012 (for postmenopausal bleeding). The pathology was benign, and tubal ligation and laparoscopic cholecystectomy.  Interval History: She continues to have light spotting only  Current Meds:  Outpatient Encounter Prescriptions as of 05/20/2014  Medication Sig  . albuterol (PROAIR HFA) 108 (90 BASE) MCG/ACT inhaler Inhale 2 puffs into the lungs 2 (two) times daily as needed.  Marland Kitchen amLODipine (NORVASC) 10 MG tablet Take 1 tablet (10 mg total) by mouth daily.  Marland Kitchen atorvastatin (LIPITOR) 20 MG tablet Take 0.5 tablets (10 mg total) by mouth daily.  Marland Kitchen  b complex vitamins capsule Take 1 capsule by mouth daily.    . benazepril (LOTENSIN) 40 MG tablet Take 1 tablet (40 mg total) by mouth daily.  .  budesonide-formoterol (SYMBICORT) 160-4.5 MCG/ACT inhaler Inhale 2 puffs into the lungs 2 (two) times daily.  . diclofenac sodium (VOLTAREN) 1 % GEL Apply topically.  . fluticasone (FLONASE) 50 MCG/ACT nasal spray Place 2 sprays into the nose daily.  . furosemide (LASIX) 20 MG tablet Take 1 tablet (20 mg total) by mouth daily.  Marland Kitchen levothyroxine (SYNTHROID, LEVOTHROID) 25 MCG tablet Take 1 tablet (25 mcg total) by mouth as directed. 1 tab daily except on Saturdays take 2 tabs  . Multiple Vitamin (MULTIVITAMIN) tablet Take 1 tablet by mouth daily.  . nebivolol (BYSTOLIC) 10 MG tablet Take 1 tablet (10 mg total) by mouth 2 (two) times daily.  . pantoprazole (PROTONIX) 40 MG tablet Take 1 tablet (40 mg total) by mouth daily.  . Probiotic Product (ALIGN) 4 MG CAPS Take by mouth.  . triamcinolone (KENALOG) 0.1 % cream Apply 1 application topically. Apply topically to affected area once daily   . albuterol (PROVENTIL) (2.5 MG/3ML) 0.083% nebulizer solution Take 3 mLs (2.5 mg total) by nebulization every 6 (six) hours as needed for wheezing or shortness of breath.  Marland Kitchen aspirin 81 MG tablet Take 81 mg by mouth daily.    Marland Kitchen HYDROcodone-homatropine (HYCODAN) 5-1.5 MG/5ML syrup Take 5 mLs by mouth every 8 (eight) hours as needed for cough.  . meloxicam (MOBIC) 15 MG tablet Take 15 mg by mouth daily.  . montelukast (SINGULAIR) 10 MG tablet Take 1 tablet (10 mg total) by mouth at bedtime as needed.  . pentoxifylline (TRENTAL) 400 MG CR tablet Take 400 mg by mouth 3 (three) times daily with meals.    . traMADol (ULTRAM) 50 MG tablet Take 50 mg by mouth every 6 (six) hours as needed.      Allergy:  Allergies  Allergen Reactions  . Shellfish Allergy Hives, Shortness Of Breath and Swelling  . Iodine     Rash  . Penicillins     REACTION: unspecified    Social Hx:   History   Social History  . Marital Status: Married    Spouse Name: N/A    Number of Children: N/A  . Years of Education: N/A    Occupational History  . Not on file.   Social History Main Topics  . Smoking status: Never Smoker   . Smokeless tobacco: Never Used  . Alcohol Use: No  . Drug Use: No  . Sexual Activity: Yes     Comment: lives with husband, no dietary restrictions   Other Topics Concern  . Not on file   Social History Narrative   Married- 40 years   Never Smoked   Alcohol use-no   Drug use-no   Daily Caffeine Use   Occupation: housewife   Caffeine use/day:  None   Does Patient Exercise:  yes    Past Surgical Hx:  Past Surgical History  Procedure Laterality Date  . Cholecystectomy  1991  . Dilation and curettage of uterus    . Tubal ligation    . Appendectomy      Past Medical Hx:  Past Medical History  Diagnosis Date  . Asthma   . Hypertension   . Obesity   . Hx of colonic polyps   . GERD (gastroesophageal reflux disease)   . History of colonic diverticulitis   . Arthritis  back  . Hyperlipidemia   . Depression   . Low back pain radiating to right leg 08/18/2012  . Anxiety   . Benign paroxysmal positional vertigo 11/01/2012  . Thyroid disease 06/01/2013  . Cervical cancer screening 10/09/2010    Qualifier: Diagnosis of  By: Wynona Luna   . 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  . Preventative health care 12/09/2013    Past Gynecological History:  SVD x 2, no cesarean sections.  No LMP recorded. Patient is postmenopausal.  Family Hx:  Family History  Problem Relation Age of Onset  . Heart disease Maternal Aunt     x 3 -2 brothers  . Hypertension Maternal Aunt   . Cancer Maternal Aunt     2 maternal aunts  . Prostate cancer Father   . Diabetes Father   . Cancer Father     lung cancer, asbestos exposure  . Heart disease Brother   . Diabetes Brother   . Other      no FH colon cancer  . Diabetes Mother   . Lupus Mother   . Heart disease Brother   . Hyperlipidemia Sister   . Heart disease Brother   . Diabetes Brother   . Heart disease Brother   . Diabetes  Brother     Review of Systems:  Constitutional  Feels well,   ENT Normal appearing ears and nares bilaterally Skin/Breast  No rash, sores, jaundice, itching, dryness Cardiovascular  No chest pain, shortness of breath, or edema  Pulmonary  No cough or wheeze.  Gastro Intestinal  No nausea, vomitting, or diarrhoea. No bright red blood per rectum, no abdominal pain, change in bowel movement, or constipation.  Genito Urinary  No frequency, urgency, dysuria, see HPI Musculo Skeletal  No myalgia, arthralgia, joint swelling or pain  Neurologic  No weakness, numbness, change in gait,  Psychology  No depression, anxiety, insomnia.   Vitals:  Blood pressure 138/82, pulse 75, temperature 98.2 F (36.8 C), temperature source Oral, height 5' 2.5" (1.588 m), weight 245 lb 3.2 oz (111.222 kg).  Physical Exam: WD in NAD Neck  Supple NROM, without any enlargements.  Lymph Node Survey No cervical supraclavicular or inguinal adenopathy Cardiovascular  Pulse normal rate, regularity and rhythm. S1 and S2 normal.  Lungs  Clear to auscultation bilateraly, without wheezes/crackles/rhonchi. Good air movement.  Skin  No rash/lesions/breakdown  Psychiatry  Alert and oriented to person, place, and time  Abdomen  Normoactive bowel sounds, abdomen soft, non-tender and obese without evidence of hernia.  Back No CVA tenderness Genito Urinary  Vulva/vagina: Normal external female genitalia.   No lesions. No discharge or bleeding.  Bladder/urethra:  No lesions or masses, well supported bladder  Vagina: normal in appearance, no prolapse  Cervix: Normal appearing, no lesions.  Uterus: Difficult to appreciate size based on body habitus, mobile, good descensus, no parametrial involvement or nodularity.  Adnexa: no palpable masses. Rectal  Good tone, no masses no cul de sac nodularity.  Extremities  No bilateral cyanosis, clubbing or edema.  Donaciano Eva, MD   05/20/2014, 4:40  PM

## 2014-05-20 NOTE — Patient Instructions (Signed)
Preparing for your Surgery  Plan for surgery on December 8 with Dr. Alycia Rossetti.  Pre-operative Testing -You will receive a phone call from presurgical testing at [facility] to arrange for a pre-operative testing appointment before your surgery.  This appointment normally occurs one to two weeks before your scheduled surgery.   -Bring your insurance card, copy of an advanced directive if applicable, medication list  -At that visit, you will be asked to sign a consent for a possible blood transfusion in case a transfusion becomes necessary during surgery.  The need for a blood transfusion is rare but having consent is a necessary part of your care.     -You should not be taking blood thinners or aspirin at least ten days prior to surgery unless instructed by your surgeon.  Day Before Surgery at [place] will be asked to take in only clear liquids the day before surgery.  Examples of clear liquids include broths, jello, and clear juices.  You will be advised to have nothing to eat or drink after midnight the evening before.    Your role in recovery Your role is to become active as soon as directed by your doctor, while still giving yourself time to heal.  Rest when you feel tired. You will be asked to do the following in order to speed your recovery:  - Cough and breathe deeply. This helps toclear and expand your lungs and can prevent pneumonia. You may be given a spirometer to practice deep breathing. A staff member will show you how to use the spirometer. - Do mild physical activity. Walking or moving your legs help your circulation and body functions return to normal. A staff member will help you when you try to walk and will provide you with simple exercises. Do not try to get up or walk alone the first time. - Actively manage your pain. Managing your pain lets you move in comfort. We will ask you to rate your pain on a scale of zero to 10. It is your responsibility to tell  your doctor or nurse where and how much you hurt so your pain can be treated.  Special Considerations -If you are diabetic, you may be placed on insulin after surgery to have closer control over your blood sugars to promote healing and recovery.  This does not mean that you will be discharged on insulin.  If applicable, your oral antidiabetics will be resumed when you are tolerating a solid diet.  -Your final pathology results from surgery should be available by the Friday after surgery and the results will be relayed to you when available.

## 2014-05-21 NOTE — Progress Notes (Signed)
[place] reviewed from 05/07/14: Uterus 9.6x6.2x6.8cm.  Contains 3 fibroids - largest 5x5x3.5cm. Normal right ovary, left ovary not seen. Donaciano Eva, MD

## 2014-05-24 ENCOUNTER — Telehealth: Payer: Self-pay | Admitting: Gynecologic Oncology

## 2014-05-24 NOTE — Telephone Encounter (Signed)
Returned call to patient.  Patient requesting to have her surgery at [facility] the week after Thanksgiving and the cancel her surgery here for Dec. 8.  Verbalizing understanding that she would need to go to [facility] for a pre-op appt and for surgery as well.  Surgery scheduled with Dr. Skeet Latch on Dec 2 and pre-op on November 13 at 9:00am.  Reinforced instructions.  Map of [facility] mailed to patient.  Advised to call for any questions or concerns.

## 2014-05-26 ENCOUNTER — Telehealth: Payer: Self-pay | Admitting: Family Medicine

## 2014-05-26 ENCOUNTER — Ambulatory Visit: Payer: Commercial Managed Care - HMO | Admitting: Gynecologic Oncology

## 2014-05-26 NOTE — Telephone Encounter (Signed)
Caller name:Hemric, Chioma Relation to UV:QQUI Call back number:[phone] Pharmacy:[facility]  Reason for call: pt is needing new rx nebivolol (BYSTOLIC) 10 MG tablet ,

## 2014-05-27 ENCOUNTER — Other Ambulatory Visit: Payer: Self-pay | Admitting: Internal Medicine

## 2014-05-27 ENCOUNTER — Other Ambulatory Visit: Payer: Self-pay

## 2014-05-27 ENCOUNTER — Encounter (HOSPITAL_COMMUNITY): Admission: RE | Payer: Self-pay | Source: Ambulatory Visit

## 2014-05-27 ENCOUNTER — Ambulatory Visit (HOSPITAL_COMMUNITY): Admission: RE | Admit: 2014-05-27 | Payer: Medicare HMO | Source: Ambulatory Visit | Admitting: Obstetrics & Gynecology

## 2014-05-27 SURGERY — DILATATION & CURETTAGE/HYSTEROSCOPY WITH VERSAPOINT RESECTION
Anesthesia: Choice

## 2014-05-27 MED ORDER — NEBIVOLOL HCL 10 MG PO TABS: 10.0000 mg | ORAL_TABLET | Freq: Two times a day (BID) | ORAL | Status: DC

## 2014-05-27 MED ORDER — FUROSEMIDE 20 MG PO TABS: 20.0000 mg | ORAL_TABLET | Freq: Every day | ORAL | Status: DC

## 2014-05-27 MED ORDER — LEVOTHYROXINE SODIUM 25 MCG PO TABS: 25.0000 ug | ORAL_TABLET | ORAL | Status: DC

## 2014-05-27 MED ORDER — BENAZEPRIL HCL 40 MG PO TABS
40.0000 mg | ORAL_TABLET | Freq: Every day | ORAL | Status: DC
Start: 2014-05-27 — End: 2014-10-18

## 2014-05-27 MED ORDER — AMLODIPINE BESYLATE 10 MG PO TABS: 10.0000 mg | ORAL_TABLET | Freq: Every day | ORAL | Status: DC

## 2014-06-10 ENCOUNTER — Ambulatory Visit: Payer: Commercial Managed Care - HMO | Admitting: Gynecologic Oncology

## 2014-06-16 ENCOUNTER — Other Ambulatory Visit (INDEPENDENT_AMBULATORY_CARE_PROVIDER_SITE_OTHER): Payer: Commercial Managed Care - HMO

## 2014-06-16 DIAGNOSIS — I10 Essential (primary) hypertension: Secondary | ICD-10-CM

## 2014-06-16 DIAGNOSIS — E079 Disorder of thyroid, unspecified: Secondary | ICD-10-CM

## 2014-06-16 DIAGNOSIS — E119 Type 2 diabetes mellitus without complications: Secondary | ICD-10-CM

## 2014-06-16 DIAGNOSIS — E785 Hyperlipidemia, unspecified: Secondary | ICD-10-CM

## 2014-06-16 LAB — RENAL FUNCTION PANEL
Albumin: 3.8 g/dL (ref 3.5–5.2)
BUN: 14 mg/dL (ref 6–23)
CALCIUM: 9.1 mg/dL (ref 8.4–10.5)
CO2: 25 meq/L (ref 19–32)
Chloride: 105 mEq/L (ref 96–112)
Creatinine, Ser: 0.8 mg/dL (ref 0.4–1.2)
GFR: 89.44 mL/min (ref >60.00)
Glucose, Bld: 95 mg/dL (ref 70–99)
POTASSIUM: 4.5 meq/L (ref 3.5–5.1)
Phosphorus: 3.9 mg/dL (ref 2.3–4.6)
SODIUM: 141 meq/L (ref 135–145)

## 2014-06-16 LAB — LIPID PANEL
CHOLESTEROL: 176 mg/dL (ref 0–200)
HDL: 57.3 mg/dL (ref >39.00)
LDL Cholesterol: 105 mg/dL — ABNORMAL HIGH (ref 0–99)
NonHDL: 118.7
Total CHOL/HDL Ratio: 3
Triglycerides: 67 mg/dL (ref 0.0–149.0)
VLDL: 13.4 mg/dL (ref 0.0–40.0)

## 2014-06-16 LAB — HEPATIC FUNCTION PANEL
ALBUMIN: 3.8 g/dL (ref 3.5–5.2)
ALK PHOS: 80 U/L (ref 39–117)
ALT: 24 U/L (ref 0–35)
AST: 24 U/L (ref 0–37)
Bilirubin, Direct: 0 mg/dL (ref 0.0–0.3)
TOTAL PROTEIN: 7.1 g/dL (ref 6.0–8.3)
Total Bilirubin: 0.3 mg/dL (ref 0.2–1.2)

## 2014-06-16 LAB — CBC
HEMATOCRIT: 40.7 % (ref 36.0–46.0)
HEMOGLOBIN: 12.4 g/dL (ref 12.0–15.0)
MCHC: 30.6 g/dL (ref 30.0–36.0)
MCV: 68.2 fl — ABNORMAL LOW (ref 78.0–100.0)
Platelets: 219 10*3/uL (ref 150.0–400.0)
RBC: 5.96 Mil/uL — AB (ref 3.87–5.11)
RDW: 16.4 % — ABNORMAL HIGH (ref 11.5–15.5)
WBC: 6 10*3/uL (ref 4.0–10.5)

## 2014-06-16 LAB — TSH: TSH: 2.93 u[IU]/mL (ref 0.35–4.50)

## 2014-06-16 LAB — HEMOGLOBIN A1C: Hgb A1c MFr Bld: 6.9 % — ABNORMAL HIGH (ref 4.6–6.5)

## 2014-06-21 ENCOUNTER — Ambulatory Visit (INDEPENDENT_AMBULATORY_CARE_PROVIDER_SITE_OTHER): Payer: Commercial Managed Care - HMO | Admitting: Family Medicine

## 2014-06-21 ENCOUNTER — Encounter: Payer: Self-pay | Admitting: Family Medicine

## 2014-06-21 VITALS — BP 134/86 | HR 71 | Temp 98.0°F | Ht 62.0 in | Wt 245.4 lb

## 2014-06-21 DIAGNOSIS — K219 Gastro-esophageal reflux disease without esophagitis: Secondary | ICD-10-CM

## 2014-06-21 DIAGNOSIS — E1169 Type 2 diabetes mellitus with other specified complication: Secondary | ICD-10-CM

## 2014-06-21 DIAGNOSIS — E669 Obesity, unspecified: Secondary | ICD-10-CM

## 2014-06-21 DIAGNOSIS — I10 Essential (primary) hypertension: Secondary | ICD-10-CM

## 2014-06-21 DIAGNOSIS — E119 Type 2 diabetes mellitus without complications: Secondary | ICD-10-CM

## 2014-06-21 DIAGNOSIS — E785 Hyperlipidemia, unspecified: Secondary | ICD-10-CM

## 2014-06-21 NOTE — Assessment & Plan Note (Signed)
Well controlled, no changes to meds. Encouraged heart healthy diet such as the DASH diet and exercise as tolerated.  °

## 2014-06-21 NOTE — Progress Notes (Signed)
Pre visit review using our clinic review tool, if applicable. No additional management support is needed unless otherwise documented below in the visit note. 

## 2014-06-21 NOTE — Assessment & Plan Note (Signed)
Tolerating statin, encouraged heart healthy diet, avoid trans fats, minimize simple carbs and saturated fats. Increase exercise as tolerated 

## 2014-06-21 NOTE — Patient Instructions (Signed)
Probiotics and fiber    DASH Eating Plan DASH stands for "Dietary Approaches to Stop Hypertension." The DASH eating plan is a healthy eating plan that has been shown to reduce high blood pressure (hypertension). Additional health benefits may include reducing the risk of type 2 diabetes mellitus, heart disease, and stroke. The DASH eating plan may also help with weight loss. WHAT DO I NEED TO KNOW ABOUT THE DASH EATING PLAN? For the DASH eating plan, you will follow these general guidelines:  Choose foods with a percent daily value for sodium of less than 5% (as listed on the food label).  Use salt-free seasonings or herbs instead of table salt or sea salt.  Check with your health care provider or pharmacist before using salt substitutes.  Eat lower-sodium products, often labeled as "lower sodium" or "no salt added."  Eat fresh foods.  Eat more vegetables, fruits, and low-fat dairy products.  Choose whole grains. Look for the word "whole" as the first word in the ingredient list.  Choose fish and skinless chicken or [place] more often than red meat. Limit fish, poultry, and meat to 6 oz (170 g) each day.  Limit sweets, desserts, sugars, and sugary drinks.  Choose heart-healthy fats.  Limit cheese to 1 oz (28 g) per day.  Eat more home-cooked food and less restaurant, buffet, and fast food.  Limit fried foods.  Cook foods using methods other than frying.  Limit canned vegetables. If you do use them, rinse them well to decrease the sodium.  When eating at a restaurant, ask that your food be prepared with less salt, or no salt if possible. WHAT FOODS CAN I EAT? Seek help from a dietitian for individual calorie needs. Grains Whole grain or whole wheat bread. Brown rice. Whole grain or whole wheat pasta. Quinoa, bulgur, and whole grain cereals. Low-sodium cereals. Corn or whole wheat flour tortillas. Whole grain cornbread. Whole grain crackers. Low-sodium  crackers. Vegetables Fresh or frozen vegetables (raw, steamed, roasted, or grilled). Low-sodium or reduced-sodium tomato and vegetable juices. Low-sodium or reduced-sodium tomato sauce and paste. Low-sodium or reduced-sodium canned vegetables.  Fruits All fresh, canned (in natural juice), or frozen fruits. Meat and Other Protein Products Ground beef (85% or leaner), grass-fed beef, or beef trimmed of fat. Skinless chicken or [place]. Ground chicken or [place]. Pork trimmed of fat. All fish and seafood. Eggs. Dried beans, peas, or lentils. Unsalted nuts and seeds. Unsalted canned beans. Dairy Low-fat dairy products, such as skim or 1% milk, 2% or reduced-fat cheeses, low-fat ricotta or cottage cheese, or plain low-fat yogurt. Low-sodium or reduced-sodium cheeses. Fats and Oils Tub margarines without trans fats. Light or reduced-fat mayonnaise and salad dressings (reduced sodium). Avocado. Safflower, olive, or canola oils. Natural peanut or almond butter. Other Unsalted popcorn and pretzels. The items listed above may not be a complete list of recommended foods or beverages. Contact your dietitian for more options. WHAT FOODS ARE NOT RECOMMENDED? Grains White bread. White pasta. White rice. Refined cornbread. Bagels and croissants. Crackers that contain trans fat. Vegetables Creamed or fried vegetables. Vegetables in a cheese sauce. Regular canned vegetables. Regular canned tomato sauce and paste. Regular tomato and vegetable juices. Fruits Dried fruits. Canned fruit in light or heavy syrup. Fruit juice. Meat and Other Protein Products Fatty cuts of meat. Ribs, chicken wings, bacon, sausage, bologna, salami, chitterlings, fatback, hot dogs, bratwurst, and packaged luncheon meats. Salted nuts and seeds. Canned beans with salt. Dairy Whole or 2% milk, cream, half-and-half, and cream  cheese. Whole-fat or sweetened yogurt. Full-fat cheeses or blue cheese. Nondairy creamers and whipped toppings.  Processed cheese, cheese spreads, or cheese curds. Condiments Onion and garlic salt, seasoned salt, table salt, and sea salt. Canned and packaged gravies. Worcestershire sauce. Tartar sauce. Barbecue sauce. Teriyaki sauce. Soy sauce, including reduced sodium. Steak sauce. Fish sauce. Oyster sauce. Cocktail sauce. Horseradish. Ketchup and mustard. Meat flavorings and tenderizers. Bouillon cubes. Hot sauce. Tabasco sauce. Marinades. Taco seasonings. Relishes. Fats and Oils Butter, stick margarine, lard, shortening, ghee, and bacon fat. Coconut, palm kernel, or palm oils. Regular salad dressings. Other Pickles and olives. Salted popcorn and pretzels. The items listed above may not be a complete list of foods and beverages to avoid. Contact your dietitian for more information. WHERE CAN I FIND MORE INFORMATION? National Heart, Lung, and Blood Institute: travelstabloid.com Document Released: 06/28/2011 Document Revised: 11/23/2013 Document Reviewed: 05/13/2013 [facility] Patient Information 2015 [place], [place]. This information is not intended to replace advice given to you by your health care provider. Make sure you discuss any questions you have with your health care provider.

## 2014-06-21 NOTE — Assessment & Plan Note (Signed)
hgba1c acceptable, minimize simple carbs. Increase exercise as tolerated. Continue current meds. No recent elevated numbers.

## 2014-06-21 NOTE — Assessment & Plan Note (Signed)
Encouraged DASH diet, decrease po intake and increase exercise as tolerated. Needs 7-8 hours of sleep nightly. Avoid trans fats, eat small, frequent meals every 4-5 hours with lean proteins, complex carbs and healthy fats. Minimize simple carbs, GMO foods. 

## 2014-06-21 NOTE — Assessment & Plan Note (Signed)
Avoid offending foods, start probiotics. Do not eat large meals in late evening and consider raising head of bed.  

## 2014-06-21 NOTE — Progress Notes (Signed)
Katelyn Fisher  [id number] 03-30-1951 06/21/2014      Progress Note-Follow Up  Subjective  Chief Complaint  Chief Complaint  Patient presents with  . Follow-up    3 month    HPI  Patient is a 63 y.o. female in today for routine medical care. She is in today for follow-up been feeling well. Is scheduled for a complete hysterectomy at [facility] this coming Wednesday for endometrial cancer. Denies any abdominal pain or recent bleeding. No recent illness. Blood sugars have been well-controlled running between 80 and 130. No polyuria or polydipsia. Denies CP/palp/SOB/HA/congestion/fevers/GI or GU c/o. Taking meds as prescribed  Past Medical History  Diagnosis Date  . Asthma   . Hypertension   . Obesity   . Hx of colonic polyps   . GERD (gastroesophageal reflux disease)   . History of colonic diverticulitis   . Arthritis     back  . Hyperlipidemia   . Depression   . Low back pain radiating to right leg 08/18/2012  . Anxiety   . Benign paroxysmal positional vertigo 11/01/2012  . Thyroid disease 06/01/2013  . Cervical cancer screening 10/09/2010    Qualifier: Diagnosis of  By: Wynona Luna   . 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  . Preventative health care 12/09/2013    Past Surgical History  Procedure Laterality Date  . Cholecystectomy  1991  . Dilation and curettage of uterus    . Tubal ligation    . Appendectomy      Family History  Problem Relation Age of Onset  . Heart disease Maternal Aunt     x 3 -2 brothers  . Hypertension Maternal Aunt   . Cancer Maternal Aunt     2 maternal aunts  . Prostate cancer Father   . Diabetes Father   . Cancer Father     lung cancer, asbestos exposure  . Heart disease Brother   . Diabetes Brother   . Other      no FH colon cancer  . Diabetes Mother   . Lupus Mother   . Heart disease Brother   . Hyperlipidemia Sister   . Heart disease Brother   . Diabetes Brother   . Heart disease Brother   . Diabetes Brother      History   Social History  . Marital Status: Married    Spouse Name: N/A    Number of Children: N/A  . Years of Education: N/A   Occupational History  . Not on file.   Social History Main Topics  . Smoking status: Never Smoker   . Smokeless tobacco: Never Used  . Alcohol Use: No  . Drug Use: No  . Sexual Activity: Yes     Comment: lives with husband, no dietary restrictions   Other Topics Concern  . Not on file   Social History Narrative   Married- 40 years   Never Smoked   Alcohol use-no   Drug use-no   Daily Caffeine Use   Occupation: housewife   Caffeine use/day:  None   Does Patient Exercise:  yes    Current Outpatient Prescriptions on File Prior to Visit  Medication Sig Dispense Refill  . albuterol (PROAIR HFA) 108 (90 BASE) MCG/ACT inhaler Inhale 2 puffs into the lungs 2 (two) times daily as needed. 3 Inhaler 2  . albuterol (PROVENTIL) (2.5 MG/3ML) 0.083% nebulizer solution Take 3 mLs (2.5 mg total) by nebulization every 6 (six) hours as needed for wheezing or shortness of breath.  150 mL 1  . amLODipine (NORVASC) 10 MG tablet Take 1 tablet (10 mg total) by mouth daily. 90 tablet 2  . aspirin 81 MG tablet Take 81 mg by mouth daily.      Marland Kitchen atorvastatin (LIPITOR) 20 MG tablet Take 0.5 tablets (10 mg total) by mouth daily. 90 tablet 2  . b complex vitamins capsule Take 1 capsule by mouth daily.      . benazepril (LOTENSIN) 40 MG tablet Take 1 tablet (40 mg total) by mouth daily. 90 tablet 2  . budesonide-formoterol (SYMBICORT) 160-4.5 MCG/ACT inhaler Inhale 2 puffs into the lungs 2 (two) times daily. 3 Inhaler 2  . diclofenac sodium (VOLTAREN) 1 % GEL Apply topically.    . fluticasone (FLONASE) 50 MCG/ACT nasal spray Place 2 sprays into the nose daily. 16 g 0  . furosemide (LASIX) 20 MG tablet Take 1 tablet (20 mg total) by mouth daily. 90 tablet 2  . HYDROcodone-homatropine (HYCODAN) 5-1.5 MG/5ML syrup Take 5 mLs by mouth every 8 (eight) hours as needed for  cough. 120 mL 0  . levothyroxine (SYNTHROID, LEVOTHROID) 25 MCG tablet Take 1 tablet (25 mcg total) by mouth as directed. 1 tab daily except on Saturdays take 2 tabs 102 tablet 1  . meloxicam (MOBIC) 15 MG tablet Take 15 mg by mouth daily.    . montelukast (SINGULAIR) 10 MG tablet Take 1 tablet (10 mg total) by mouth at bedtime as needed. 30 tablet 3  . Multiple Vitamin (MULTIVITAMIN) tablet Take 1 tablet by mouth daily.    . nebivolol (BYSTOLIC) 10 MG tablet Take 1 tablet (10 mg total) by mouth 2 (two) times daily. 180 tablet 1  . pantoprazole (PROTONIX) 40 MG tablet Take 1 tablet (40 mg total) by mouth daily. 90 tablet 2  . pentoxifylline (TRENTAL) 400 MG CR tablet Take 400 mg by mouth 3 (three) times daily with meals.      . Probiotic Product (ALIGN) 4 MG CAPS Take by mouth.    . traMADol (ULTRAM) 50 MG tablet Take 50 mg by mouth every 6 (six) hours as needed.      . triamcinolone (KENALOG) 0.1 % cream Apply 1 application topically. Apply topically to affected area once daily      No current facility-administered medications on file prior to visit.    Allergies  Allergen Reactions  . Shellfish Allergy Hives, Shortness Of Breath and Swelling  . Iodine     Rash  . Penicillins     REACTION: unspecified  . Sulfa Antibiotics Rash    Review of Systems  Review of Systems  Constitutional: Negative for fever and malaise/fatigue.  HENT: Negative for congestion.   Eyes: Negative for discharge.  Respiratory: Negative for shortness of breath.   Cardiovascular: Negative for chest pain, palpitations and leg swelling.  Gastrointestinal: Negative for nausea, abdominal pain and diarrhea.  Genitourinary: Negative for dysuria.  Musculoskeletal: Negative for falls.  Skin: Negative for rash.  Neurological: Negative for loss of consciousness and headaches.  Endo/Heme/Allergies: Negative for polydipsia.  Psychiatric/Behavioral: Negative for depression and suicidal ideas. The patient is not  nervous/anxious and does not have insomnia.     Objective  BP 134/86 mmHg  Pulse 71  Temp(Src) 98 F (36.7 C) (Oral)  Ht 5\' 2"  (1.575 m)  Wt 245 lb 6.4 oz (111.313 kg)  BMI 44.87 kg/m2  SpO2 100%  Physical Exam  Physical Exam  Constitutional: She is oriented to person, place, and time and well-developed, well-nourished, and in  no distress. No distress.  HENT:  Head: Normocephalic and atraumatic.  Eyes: Conjunctivae are normal.  Neck: Neck supple. No thyromegaly present.  Cardiovascular: Normal rate, regular rhythm and normal heart sounds.   No murmur heard. Pulmonary/Chest: Effort normal and breath sounds normal. She has no wheezes.  Abdominal: She exhibits no distension and no mass.  Musculoskeletal: She exhibits no edema.  Lymphadenopathy:    She has no cervical adenopathy.  Neurological: She is alert and oriented to person, place, and time.  Skin: Skin is warm and dry. No rash noted. She is not diaphoretic.  Psychiatric: Memory, affect and judgment normal.    Lab Results  Component Value Date   TSH 2.93 06/16/2014   Lab Results  Component Value Date   WBC 6.0 06/16/2014   HGB 12.4 06/16/2014   HCT 40.7 06/16/2014   MCV 68.2 Repeated and verified X2.* 06/16/2014   PLT 219.0 06/16/2014   Lab Results  Component Value Date   CREATININE 0.8 06/16/2014   BUN 14 06/16/2014   NA 141 06/16/2014   K 4.5 06/16/2014   CL 105 06/16/2014   CO2 25 06/16/2014   Lab Results  Component Value Date   ALT 24 06/16/2014   AST 24 06/16/2014   ALKPHOS 80 06/16/2014   BILITOT 0.3 06/16/2014   Lab Results  Component Value Date   CHOL 176 06/16/2014   Lab Results  Component Value Date   HDL 57.30 06/16/2014   Lab Results  Component Value Date   LDLCALC 105* 06/16/2014   Lab Results  Component Value Date   TRIG 67.0 06/16/2014   Lab Results  Component Value Date   CHOLHDL 3 06/16/2014     Assessment & Plan  Essential hypertension Well controlled, no  changes to meds. Encouraged heart healthy diet such as the DASH diet and exercise as tolerated.   GERD Avoid offending foods, start probiotics. Do not eat large meals in late evening and consider raising head of bed.   Obesity Encouraged DASH diet, decrease po intake and increase exercise as tolerated. Needs 7-8 hours of sleep nightly. Avoid trans fats, eat small, frequent meals every 4-5 hours with lean proteins, complex carbs and healthy fats. Minimize simple carbs, GMO foods.  Hyperlipidemia Tolerating statin, encouraged heart healthy diet, avoid trans fats, minimize simple carbs and saturated fats. Increase exercise as tolerated  Diabetes mellitus type 2, controlled hgba1c acceptable, minimize simple carbs. Increase exercise as tolerated. Continue current meds. No recent elevated numbers.

## 2014-06-23 HISTORY — PX: ABDOMINAL HYSTERECTOMY: SHX81

## 2014-06-24 DIAGNOSIS — Z90710 Acquired absence of both cervix and uterus: Secondary | ICD-10-CM

## 2014-06-24 HISTORY — DX: Acquired absence of both cervix and uterus: Z90.710

## 2014-07-14 ENCOUNTER — Telehealth: Payer: Self-pay | Admitting: Oncology

## 2014-07-14 NOTE — Telephone Encounter (Signed)
left message for patient to return call to schedule np appt.  °

## 2014-07-19 ENCOUNTER — Telehealth: Payer: Self-pay | Admitting: Oncology

## 2014-07-19 NOTE — Telephone Encounter (Signed)
S/w patient and gave np appt for 01/18 @ 10:30 w/Dr. Marko Plume, chemo edu 01/14 @ 10.

## 2014-07-22 ENCOUNTER — Other Ambulatory Visit: Payer: Commercial Managed Care - HMO

## 2014-07-27 ENCOUNTER — Telehealth: Payer: Self-pay | Admitting: Family Medicine

## 2014-07-27 NOTE — Telephone Encounter (Signed)
Caller name: Vondra, [place] A Relation to pt: self  Call back number: [phone]   Reason for call:  Pt requesting a refill HYDROcodone-homatropine (HYCODAN) 5-1.5 MG/5ML syrup

## 2014-07-27 NOTE — Telephone Encounter (Signed)
Please advise  Pt last seen on 06-21-14 Hycodan cough syrup last filled 09/04/13 162mL with 0

## 2014-07-28 ENCOUNTER — Encounter: Payer: Self-pay | Admitting: Family Medicine

## 2014-07-28 ENCOUNTER — Telehealth: Payer: Self-pay | Admitting: Family Medicine

## 2014-07-28 ENCOUNTER — Other Ambulatory Visit: Payer: Self-pay | Admitting: General Practice

## 2014-07-28 MED ORDER — HYDROCODONE-HOMATROPINE 5-1.5 MG/5ML PO SYRP: 5.0000 mL | ORAL_SOLUTION | Freq: Three times a day (TID) | ORAL | Status: DC | PRN

## 2014-07-28 NOTE — Telephone Encounter (Signed)
Medication was filled and printed for Dr. Charlett Blake to sign (out of office today). Printed per previous phone note.

## 2014-07-28 NOTE — Telephone Encounter (Signed)
OK to give refill on Hycodan, same sig, 120 ml

## 2014-07-28 NOTE — Telephone Encounter (Signed)
Caller name:Katelyn Fisher Relation to XH:BZJI Call back number:[phone] Pharmacy:  Reason for call: pt is needing rx HYDROcodone-homatropine (HYCODAN) 5-1.5 MG/5ML syrup please call when available and ready for pick up

## 2014-07-29 ENCOUNTER — Telehealth: Payer: Self-pay | Admitting: *Deleted

## 2014-07-29 ENCOUNTER — Other Ambulatory Visit: Payer: Commercial Managed Care - HMO

## 2014-07-29 ENCOUNTER — Ambulatory Visit: Payer: Commercial Managed Care - HMO | Admitting: Oncology

## 2014-07-29 ENCOUNTER — Ambulatory Visit: Payer: Commercial Managed Care - HMO

## 2014-07-29 NOTE — Telephone Encounter (Signed)
Spoke with the pt and informed her that the rx she requested was approved and ready for her to pick up.  Pt agreed.//AB/CMA

## 2014-07-29 NOTE — Telephone Encounter (Signed)
Requested rx through [facility].  Rx was approved by Dr. Charlett Blake.  Called and [facility] @ 1:40pm @ [phone]) informing the pt that the rx was approved and will be left up front for her to pick up.//AB/CMA

## 2014-08-05 ENCOUNTER — Other Ambulatory Visit: Payer: Commercial Managed Care - HMO

## 2014-08-06 ENCOUNTER — Other Ambulatory Visit: Payer: Self-pay | Admitting: Gynecologic Oncology

## 2014-08-06 DIAGNOSIS — C541 Malignant neoplasm of endometrium: Secondary | ICD-10-CM

## 2014-08-06 NOTE — Progress Notes (Signed)
Referral made to radiation per Dr. Skeet Latch for vaginal brachytherapy.

## 2014-08-08 ENCOUNTER — Other Ambulatory Visit: Payer: Self-pay | Admitting: Oncology

## 2014-08-08 DIAGNOSIS — C541 Malignant neoplasm of endometrium: Secondary | ICD-10-CM

## 2014-08-09 ENCOUNTER — Telehealth: Payer: Self-pay | Admitting: Oncology

## 2014-08-09 ENCOUNTER — Other Ambulatory Visit (HOSPITAL_BASED_OUTPATIENT_CLINIC_OR_DEPARTMENT_OTHER): Payer: Commercial Managed Care - HMO

## 2014-08-09 ENCOUNTER — Encounter: Payer: Self-pay | Admitting: Oncology

## 2014-08-09 ENCOUNTER — Telehealth: Payer: Self-pay | Admitting: *Deleted

## 2014-08-09 ENCOUNTER — Ambulatory Visit: Payer: Commercial Managed Care - HMO

## 2014-08-09 ENCOUNTER — Ambulatory Visit (HOSPITAL_BASED_OUTPATIENT_CLINIC_OR_DEPARTMENT_OTHER): Payer: Commercial Managed Care - HMO | Admitting: Oncology

## 2014-08-09 VITALS — BP 149/89 | HR 87 | Temp 98.4°F | Resp 20 | Ht 62.0 in | Wt 243.9 lb

## 2014-08-09 DIAGNOSIS — K635 Polyp of colon: Secondary | ICD-10-CM

## 2014-08-09 DIAGNOSIS — J4520 Mild intermittent asthma, uncomplicated: Secondary | ICD-10-CM

## 2014-08-09 DIAGNOSIS — K5792 Diverticulitis of intestine, part unspecified, without perforation or abscess without bleeding: Secondary | ICD-10-CM

## 2014-08-09 DIAGNOSIS — Z808 Family history of malignant neoplasm of other organs or systems: Secondary | ICD-10-CM

## 2014-08-09 DIAGNOSIS — I878 Other specified disorders of veins: Secondary | ICD-10-CM

## 2014-08-09 DIAGNOSIS — Z803 Family history of malignant neoplasm of breast: Secondary | ICD-10-CM

## 2014-08-09 DIAGNOSIS — C541 Malignant neoplasm of endometrium: Secondary | ICD-10-CM

## 2014-08-09 DIAGNOSIS — K219 Gastro-esophageal reflux disease without esophagitis: Secondary | ICD-10-CM

## 2014-08-09 DIAGNOSIS — I10 Essential (primary) hypertension: Secondary | ICD-10-CM

## 2014-08-09 LAB — CBC WITH DIFFERENTIAL/PLATELET
BASO%: 0.9 % (ref 0.0–2.0)
Basophils Absolute: 0.1 10*3/uL (ref 0.0–0.1)
EOS ABS: 0.1 10*3/uL (ref 0.0–0.5)
EOS%: 2 % (ref 0.0–7.0)
HEMATOCRIT: 40.9 % (ref 34.8–46.6)
HGB: 12.2 g/dL (ref 11.6–15.9)
LYMPH#: 1.9 10*3/uL (ref 0.9–3.3)
LYMPH%: 32.7 % (ref 14.0–49.7)
MCH: 20.5 pg — AB (ref 25.1–34.0)
MCHC: 29.7 g/dL — AB (ref 31.5–36.0)
MCV: 69.1 fL — AB (ref 79.5–101.0)
MONO#: 0.5 10*3/uL (ref 0.1–0.9)
MONO%: 8.8 % (ref 0.0–14.0)
NEUT#: 3.3 10*3/uL (ref 1.5–6.5)
NEUT%: 55.6 % (ref 38.4–76.8)
Platelets: 210 10*3/uL (ref 145–400)
RBC: 5.92 10*6/uL — AB (ref 3.70–5.45)
RDW: 15.6 % — ABNORMAL HIGH (ref 11.2–14.5)
WBC: 5.9 10*3/uL (ref 3.9–10.3)

## 2014-08-09 LAB — COMPREHENSIVE METABOLIC PANEL (CC13)
ALK PHOS: 94 U/L (ref 40–150)
ALT: 23 U/L (ref 0–55)
AST: 20 U/L (ref 5–34)
Albumin: 3.7 g/dL (ref 3.5–5.0)
Anion Gap: 8 mEq/L (ref 3–11)
BUN: 9.3 mg/dL (ref 7.0–26.0)
CALCIUM: 9.2 mg/dL (ref 8.4–10.4)
CO2: 27 mEq/L (ref 22–29)
CREATININE: 0.8 mg/dL (ref 0.6–1.1)
Chloride: 108 mEq/L (ref 98–109)
EGFR: 90 mL/min/{1.73_m2} — ABNORMAL LOW (ref >90)
GLUCOSE: 108 mg/dL (ref 70–140)
Potassium: 3.9 mEq/L (ref 3.5–5.1)
SODIUM: 144 meq/L (ref 136–145)
Total Bilirubin: 0.25 mg/dL (ref 0.20–1.20)
Total Protein: 7.1 g/dL (ref 6.4–8.3)

## 2014-08-09 MED ORDER — LIDOCAINE-PRILOCAINE 2.5-2.5 % EX CREA: TOPICAL_CREAM | CUTANEOUS | Status: DC

## 2014-08-09 MED ORDER — ONDANSETRON HCL 8 MG PO TABS: 8.0000 mg | ORAL_TABLET | Freq: Three times a day (TID) | ORAL | Status: DC | PRN

## 2014-08-09 MED ORDER — LORAZEPAM 1 MG PO TABS: ORAL_TABLET | ORAL | Status: DC

## 2014-08-09 MED ORDER — DEXAMETHASONE 4 MG PO TABS
ORAL_TABLET | ORAL | Status: DC
Start: 2014-08-09 — End: 2014-08-30

## 2014-08-09 NOTE — Patient Instructions (Signed)
We will send prescriptions to your pharmacy   1.decadron (dexamethasone, steroid) 4 mg. Take five tablets +(=20 mg) with food 12 hrs before taxol chemotherapy and five tablets with food 6 hrs before taxol   2.zofran (ondansetron) 8mg  One tablet every 8 hrs as needed for nausea. Will not make you drowsy. Fine to take one tablet AM after chemo whether or not any nausea then, to extend coverage for nausea a bit longer. Other than that dose, fine to take just as needed for nausea   3.ativan (lorazepam) 0.5 mg. One tablet swallow or dissolve under tongue every 6 hrs as needed for nausea. WIll make you drowsy and a little forgetful around each dose. Fine to take one tablet at bedtime night of chemo whether or not any nausea.   4.EMLA cream: numbing cream for the portacath. Squeeze out ~ an inch of the cream onto skin over portacath, then cover with a square of plastic wrap from kitchen to keep it in place and off of your clothes. Put the cream on shortly before you come to office for chemo.  You can call any time if needed [phone]

## 2014-08-09 NOTE — Progress Notes (Signed)
Checked in new pt with no financial concerns prior to seeing the dr. I informed pt that if chemo is part of her treatment Raquel will contact her ins to see if Josem Kaufmann is req and will obtain that if it is as well as contact foundations that offer copay assistance for chemo if needed. She has Raquel's card for any billing or insurance questions or concerns.

## 2014-08-09 NOTE — Telephone Encounter (Signed)
Per staff message and POF I have scheduled appts. Advised scheduler of appts. JMW  

## 2014-08-09 NOTE — Progress Notes (Signed)
[place] NEW PATIENT EVALUATION   Name: Katelyn Fisher Date: 08/09/2014 MRN: [id number] DOB: 09-Mar-1951  REFERRING PHYSICIAN: Terrence Dupont Rossi/ Janie Morning cc Mosie Lukes, MD,Marie-Lynn Lequita Halt, J.Bardelas, Jari Pigg, Verl Blalock  Outside records from [facility] reviewed in detail for this consultation.  REASON FOR REFERRAL: IA mixed serous endometroid endometrial carcinoma, for consideration of chemotherapy   HISTORY OF PRESENT ILLNESS:Katelyn Fisher is a 64 y.o. 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  Patient presented to Dr Dellis Filbert with new onset postmenopausal spotting. [place] 05-07-14 reportedly had thickened endometrial stripe, with 3 fibroids largest 5x 5x 3.5 cm;  endometrial biopsy 05-07-14 reportedly was concerning for at least FIGO grade 1 endometrial carcinoma. She had consultation with Dr Denman George on 05-20-14. Surgery was robotic hysterectomy, BSO, bilateral pelvic and para-aortic nodes by Dr Skeet Latch at [facility] on 06-23-14. Patient did well with surgery, discharged home on POD #1. Pathology [phone]) found mixed high grade carcinoma (serous 50% and endometrioid 50%) FIGO grade 3, involving inner half of myometrium with depth 5 mm where wall 3 cm thick, no serosal or lower uterine segment involvement, no cervical or adnexal involvement, LVSI present, 0/15 nodes involved. PET/ CT at [facility] 07-30-14 had no findings of concern for distant disease.  She was presented at [facility] multidisciplinary conference, with recommendations from that conference and from Dr Skeet Latch for 6 cycles of taxane platinum chemotherapy followed by vaginal brachytherapy, and for genetics counseling. Patient and husband attended chemotherapy teaching class prior to visit today. She has not had radiation  oncology consultation as yet. She had follow up with Dr Skeet Latch on 08-06-14 at [facility], and will see her next in [place] in ~ 3 months.  Patient has been recovering well from the surgery, back to most regular activities tho still limited lifting. She is having no pain. Bowels are moving well now, after some initial constipation following surgery. She was not on LMW heparin after the robotic surgery. She has had no bleeding, no fever, no symptoms of bladder infection. Appetite is generally good.   REVIEW OF SYSTEMS as above, also: No HA, good visual acuity with glasses, occasional environmental allergies/ sinus symptoms, no difficulty hearing. Upper dentures, has dentist, no active dental problems. No thyroid disease. No changes in breasts. Last asthma flare was with respiratory infection summer 2015. No cardiac symptoms. Weight stable. No nausea. No LE swelling. Chronic arthritis symptoms back and knee. No peripheral neuropathy. IV access was difficult with surgery.  Remainder of full 10 point review of systems negative.   ALLERGIES: Oxycodone; Shellfish allergy; Iodine; Penicillins; and Sulfa antibiotics  Oral pain medication after surgery "locked jaw" the 2 times that she used it, discontinued then.  PAST MEDICAL/ SURGICAL HISTORY:    G2 LMP 10 years ago. Asthma since age 69, uses prn inhalers HTN Occasional GERD Colon polyps previously, last  colonsocopy 10-2011 by Dr Verl Blalock with no polyps, next recommended in 5 years = 10-2016. Benign positional vertigo, improved with medication then Cholecystectomy age 22 in [place] BTL/ D&C Last mammograms at [facility] 08-31-13 Flu vaccine done Pneumovax, date not known  CURRENT MEDICATIONS: reviewed as listed now in EMR. Prescriptions to be sent for decadron 20 mg with food 12 hrs and 6 hrs prior to taxol, ativan, zofran, EMLA  PHARMACY: [facility]  SOCIAL HISTORY: From [place], has lived in [place] x 44 years, Married x  40 years. Never smoker, no ETOH. 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 2 daughters, one locally and one in [place].   FAMILY HISTORY:  Father prostate cancer Mother lupus, DM Brother and sister with DM and cardiac disease 2 maternal aunts with breast cancer in 60s. One maternal aunt with ovarian cancer age ~ 19 One daughter with HTN           PHYSICAL EXAM:  height is 5' 2"  (1.575 m) and weight is 243 lb 14.4 oz (110.632 kg). Her oral temperature is 98.4 F (36.9 C). Her blood pressure is 149/89 and her pulse is 87. Her respiration is 20 and oxygen saturation is 100%.  Alert, pleasant, cooperative lady looks stated age, easily mobile. Good historian, family very supportive.  HEENT:normal hair pattern. PERRL, not icteric. Oral mucosa moist, upper dentures, posterior pharynx clear. Neck supple without JVD or thyroid mass.  RESPIRATORY:lungs clear to A and P. Respirations not labored RA  CARDIAC/ VASCULAR: heart RRR, clear heart sounds. Peripheral pulses intact and symmetrical  ABDOMEN:soft, not tender, not distended. Incisions from robotic surgery closed, not tender, healing well. No appreciable HSM or mass, normal BS.  BREASTS: bilaterally without dominant mass, skin or nipple findings of concern. Axillae benign.  NEUROLOGIC:CN, motor, sensory, cerebellar not remarkable. PSYCH appropriate mood and affect  LYMPH NODES: no cervical, supraclavicular, axillary or inguinal adenopathy SKIN: without rash, ecchymosis, petechiae MUSCULOSKELETAL:good muscle mass thruout. No LE swelling, cords, tenderness. Back not tender.    LABORATORY DATA:  Results for orders placed or performed in visit on 08/09/14 (from the past 48 hour(s))  CBC with Differential     Status: Abnormal   Collection Time: 08/09/14 10:20 AM  Result Value Ref Range   WBC 5.9 3.9 - 10.3 10e3/uL   NEUT# 3.3 1.5 - 6.5 10e3/uL   HGB 12.2 11.6 - 15.9 g/dL   HCT 40.9 34.8 - 46.6 %   Platelets 210 145 - 400  10e3/uL   MCV 69.1 (L) 79.5 - 101.0 fL   MCH 20.5 (L) 25.1 - 34.0 pg   MCHC 29.7 (L) 31.5 - 36.0 g/dL   RBC 5.92 (H) 3.70 - 5.45 10e6/uL   RDW 15.6 (H) 11.2 - 14.5 %   lymph# 1.9 0.9 - 3.3 10e3/uL   MONO# 0.5 0.1 - 0.9 10e3/uL   Eosinophils Absolute 0.1 0.0 - 0.5 10e3/uL   Basophils Absolute 0.1 0.0 - 0.1 10e3/uL   NEUT% 55.6 38.4 - 76.8 %   LYMPH% 32.7 14.0 - 49.7 %   MONO% 8.8 0.0 - 14.0 %   EOS% 2.0 0.0 - 7.0 %   BASO% 0.9 0.0 - 2.0 %     CMET available after visit entirely normal , including glucose 108, creatinine 0.8, LFTs. EGFR 90  PATHOLOGY: Surgical pathology exam12/08/2013  [place]  Specimen  Tissue   Result Narrative  Patient:     KENNETHA, PEARMAN MRN:         [id number] DOB (Age):   1950-11-07 (Age: 40) Collected:   06/23/2014 Received:    06/23/2014 Completed:   06/28/2014  Surgical Pathology Report    Accession #:   [id number]  Diagnosis: A:  Uterus, cervix, bilateral ovaries and fallopian tubes, total hysterectomy and bilateral salpingo-oophorectomy  Histologic type:     mixed high grade carcinoma (serous carcinoma 50%, endometrioid adenocarcinoma 50%) (FSA1, A20) (see comment)  Histologic grade:     FIGO grade 3   Tumor site:     posterior upper corpus   Tumor size (gross):     2.0 cm (microscopic measurement)        Myometrial invasion: Inner half Depth:     5 mm           Wall thickness:     3 cm           Percent:     17% (FSA1)  Serosal involvement:     not identified             Lower uterine segment involvement:     not identified             Cervical involvement:     not identified        Adnexal involvement:     not identified        Other involved sites:     not applicable        Cervical/vaginal margin and distance:          widely negative            Lymphovascular space invasion:     present (FSA1 and A22)        Regional lymph nodes (see other specimens):      Total number involved:     0                 Total number examined:     15       Additional pathologic findings:      Right ovary:     atrophic with scant adhesions       Left ovary:     atrophic with endosalpingiosis Endometrium:     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) (A21); predominantly background atrophic endometrium Cervix:     atrophic, Nabothian cysts  Myometrium:     adenomyosis, submucosal and intramural leiomyomata (size up to 5.5 cm), small hemangioma (FSA1)  Right fallopian tube:     mild chronic inflammation and scant adhesions; s/p ligation by gross examination  Left fallopian tube:     mild chronic inflammation and scant adhesions; s/p ligation by gross examination       AJCC Pathologic Stage: pT1a pN0 pMx FIGO (2009 classification) Stage Grouping:     IA Note:  This pathologic stage assessment is based on information available at the time of this report, and is subject to change pending clinical review and additional information. Best block for future studies:     A20  Normal block for future studies:     A2  B: Lymph nodes, right pelvic, excision  - Six lymph nodes negative for metastatic carcinoma (0/6)   C: Lymph nodes, left pelvic, excision  - Nine lymph nodes, negative for metastatic carcinoma (0/9)    Comment: The review of the original frozen section slides shows at least FIGO grade 2 endometrial carcinoma with prominent frozen section artifact and LVSI.  Additional representative sections show a serous component (A20).  The entire endometrium is submitted for light microscopic examination.   Intraoperative Consult Diagnosis: Frozen section diagnosis was requested by Dr. Skeet Latch in OR #[id number] at 11:01 a.m. and delivered at 11:41 a.m. (A)  FSA1:  Uterus, representative section -  FIGO grade 1 endometrioid adenocarcinoma with focal lymphovascular space invasion  Drs. Rusin, Allen and Bookhout on 06/23/14 at 11:52 a.m. (A)  Frozen Section Pathologist: Marrion Coy, MD  Clinical History: The patient is a 64 year old woman with FIGO grade 1 endometrioid carcinoma on biopsy.  Gross Description: Received are three appropriately labeled containers.  Container A is additionally labeled "uterus, cervix, bilateral tubes and ovaries."   Adnexa:      present, bilateral fallopian tubes and ovaries  Weight:      210 grams  Shape:      pyriform but irregular  Dimensions:           height: 16.0 cm       anterior to posterior width: 6.0 cm       breadth at fundus: 8.0 cm  Serosa:      tan/brown, glistening, with multifocal red/brown discoloration  Cervix:            ectocervix: tan/brown and atrophic with slight surface nodularity       endocervix: tan/brown and trabeculated       length of endocervical canal: 5.5 cm                  Endomyometrium:           length of endometrial cavity: 5.0 cm            width of endometrial cavity at fundus: distorted secondary to fibroids measuring 5.5 cm anteriorly and 2.5 cm posteriorly       tumor findings:      There is a 0.7 x 0.4 x 0.3 cm polypoid projection on the anterior mid corpus. The remainder of the endometrium       consists of flat tan/brown nodularity with multiple underlying tan/white whorled nodules.                                      location and extent:     The polyp is located in the anterior mid corpus.       myometrial invasion:         no apparent invasion       thickness of myometrial wall at deepest gross invasion: The myometrial wall for the anterior myometrium is 2.6 cm. The                                myometrial wall posteriorly measures 1.8 cm.       other findings or comments: There are multiple tan/white whorled nodules measuring up to 3.7 cm in the anterior uterus, and 3.5                 cm in the posterior uterus.  Adnexa:      Right ovary:                dimensions: 2.7 x 1.5 x 1.0 cm                 external surface: tan/white, cerebriform with multifocal  red/brown discolorations            cut surface:      tan/white, and unremarkable       Right fallopian tube:                dimensions: 4.0 cm in length and 0.5 cm in diameter                 other findings: The fallopian tube is fimbriated and otherwise unremarkable. The cut surface  shows a pinpoint lumen. The            proximal aspect of the tube shows well healed surgical ligation.       Left ovary:                dimensions: 2.5 x 1.0 x 1.0 cm            external surface:  tan/white and cerebriform            cut surface:      tan/white and unremarkable      Left fallopian tube:                dimensions: 2.7 cm in length and 0.6 cm in diameter                 other findings: The fallopian tube is fimbriated and the cut surface shows a pinpoint lumen. The proximal aspect of the tube                 shows well healed surgical ligation.  Lymph nodes:     submitted separately      RADIOGRAPHY: EXAM: CT abdomen and pelvis without contrast    [facility] DATE: 07/30/14 13:34:51 ACCESSION: [id number] UN DICTATED: 07/30/14 15:55:40 INTERPRETATION LOCATION: [place]  CLINICAL INDICATION:  65 year old female with endometrial cancer, status post recent hysterectomy and BSO. Evaluate for metastatic disease.  COMPARISON:  PET/CT same day.  TECHNIQUE:  A spiral CT scan was obtained without IV contrast from the lung bases to the pubic symphysis. Oral contrast was administered. Images were reconstructed axially at 5-mm increments. Coronal and sagittal reformatted images were also provided for further evaluation.  For all [facility] CT exams, radiation dose reduction device (automated exposure control) is used or manual techniques with radiation dose As Low As Reasonably Achievable (ALARA) protocol are followed using age and patient-size-specific scan parameters, while maintaining the necessary diagnostic image quality.  FINDINGS:  The lung bases are unremarkable. No pleural effusion is  present.  Limited evaluation of the abdominal and pelvic viscera without IV contrast administration.  The liver, spleen, pancreas, adrenal glands, and kidneys demonstrate an unremarkable noncontrast appearance.  The gallbladder is surgically absent.  There is a small hiatal hernia. Enteric contrast extends in the proximal colon there no dilated loops of bowel to suggest obstruction. The appendix is visualized in the right lower quadrant and is normal.  There is a small amount of free fluid noted within the pelvis. No free intra-abdominal air is noted.  The bladder is well-distended and normal in appearance. The uterus and ovaries are absent.  There is a 2.8 cm hypodense lesion along the left external iliac chain. Additionally, a 1.2 cm hypodense lesion is sen in the left lower quadrant.   Otherwise, no pathologically enlarged lymph nodes are noted throughout the abdomen and pelvis.  Multilevel degenerative disc disease is noted throughout the visualized thoracolumbar spine, most pronounced at L5-S1 with moderate-severe intervertebral disc space loss. No acute osseous abnormalities or osteoblastic/lytic lesions are noted.  IMPRESSION: 1.  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  A 1.2 cm hypodense lesion is sen in the left lower quadrant. This lesion is indeterminate and may represent a reactive lymph node. However, an implant could not be excluded based on imaging. Correlation with PET/CT findings and followup are recommended. 2.  Small hiatal hernia.     Care  CTp Abdomen Pelvis Wo Contrast1/02/2015  [facility]  Result Narrative  EXAM: CT abdomen and pelvis without contrast DATE: 07/30/14 13:34:51 ACCESSION: [id number] UN DICTATED: 07/30/14 15:55:40 INTERPRETATION LOCATION: [place]  CLINICAL INDICATION:  64 year old female with endometrial cancer, status post recent hysterectomy and BSO. Evaluate for metastatic  disease.  COMPARISON:  PET/CT same day.  TECHNIQUE:  A spiral CT scan was obtained without IV contrast from the lung bases to the pubic symphysis. Oral contrast was administered. Images were reconstructed axially at 5-mm increments. Coronal and sagittal reformatted images were also provided for further evaluation.  For all [facility] CT exams, radiation dose reduction device (automated exposure control) is used or manual techniques with radiation dose As Low As Reasonably Achievable (ALARA) protocol are followed using age and patient-size-specific scan parameters, while maintaining the necessary diagnostic image quality.  FINDINGS:  The lung bases are unremarkable. No pleural effusion is present.  Limited evaluation of the abdominal and pelvic viscera without IV contrast administration.  The liver, spleen, pancreas, adrenal glands, and kidneys demonstrate an unremarkable noncontrast appearance.  The gallbladder is surgically absent.  There is a small hiatal hernia. Enteric contrast extends in the proximal colon there no dilated loops of bowel to suggest obstruction. The appendix is visualized in the right lower quadrant and is normal.  There is a small amount of free fluid noted within the pelvis. No free intra-abdominal air is noted.  The bladder is well-distended and normal in appearance. The uterus and ovaries are absent.  There is a 2.8 cm hypodense lesion along the left external iliac chain. Additionally, a 1.2 cm hypodense lesion is sen in the left lower quadrant.   Otherwise, no pathologically enlarged lymph nodes are noted throughout the abdomen and pelvis.  Multilevel degenerative disc disease is noted throughout the visualized thoracolumbar spine, most pronounced at L5-S1 with moderate-severe intervertebral disc space loss. No acute osseous abnormalities or osteoblastic/lytic lesions are noted.  IMPRESSION: 1.  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  A 1.2 cm hypodense lesion is sen in the left lower quadrant. This lesion is indeterminate and may represent a reactive lymph node. However, an implant could not be excluded based on imaging. Correlation with PET/CT findings and followup are recommended. 2.  Small hiatal hernia.   Status Results Details   Encounter Summary  PET Skull Base Mid Thigh1/02/2015  [facility]  PET Skull Base Mid Thigh1/02/2015  [facility]  Result Narrative  EXAM: Positron emission tomography (PET) with concurrently acquired computed tomography (CT) for attenuation correction and anatomic localization: skull base to mid-thigh. DATE: 07/30/14 13:33:38 ACCESSION: [id number] UN DICTATED: 07/30/14 14:45:31 INTERPRETATION LOCATION: [place]  CLINICAL INDICATION: 64 Year Old (F): C54.1 - Endometrial cancer (RAF-HCC). Serous endometrial cancer   64 year-old female with staging of newly discovered mixed serous and endometrioid endometrial cancer.  COMPARISON: No prior PET/CT studies  RADIOPHARMACEUTICAL: F-18 Fluorodeoxyglucose (FDG), IV  TECHNIQUE: Following the administration of radiopharmaceutical, PET images were acquired using 3D-acquisition and reconstructed with attenuation-correction.  A single-breathhold CT scan was obtained at quiet end-expiration with oral contrast for anatomic localization and attenuation-correction.  The coregistered PET and CT images were evaluated in axial, coronal, and sagittal planes.  Scanner: Siemens Biograph mCT  Serum glucose: 81 mg/dL Injected activity: 11.6 mCi Site of injection: Left antecubital Time of injection: 1128 Time of scan: 1250 Liver SUVavg: 2.2  FINDINGS:  HEAD/NECK: - No abnormal focal radiotracer uptake - No cervical adenopathy  CHEST: Axillae: Mild bilateral  axillary uptake  Breasts: No abnormal breast uptake  Mediastinum/hila: No adenopathy Lungs: No pulmonary nodules Pleura: No effusions Cardiovascular: No abnormal  uptake   ABDOMEN/PELVIS: Liver: No focal abnormality Gallbladder: Cholecystectomy clips are present in the gallbladder fossa. Spleen: No splenomegaly. Overall FDG uptake is normal. No focal abnormalities. Pancreas: No focal abnormality Adrenal glands: Unremarkable Kidneys: Unremarkable GI Tract: Unremarkable GU Tract: Patient is status post TAH/BSO. Residual activity in the cervix is likely postsurgical. A small amount of free fluid in the pelvis and near the left external iliac vessels is not metabolically active and may be postsurgical. Adenopathy: Mildly active bilateral inguinal nodes are likely reactive in nature.  MUSCULOSKELETAL: - No suspicious metabolically active osseous lesions are identified  - No foci of abnormal FDG uptake are noted involving the external soft tissues   IMPRESSION: - No evidence of residual or recurrent nodal or distant metastatic disease.        DISCUSSION: History as above reviewed in entirety. Circumstances surrounding diagnosis, surgery and pathology information discussed. Rationale for adjuvant chemotherapy for grade 3 serous features discussed. Patient and husband were comfortable with chemotherapy education information. Discussed usual outpatient administration of chemotherapy, antiemetics, premedication steroids for taxol. Peripheral venous access does not appear adequate for chemo; discussed PAC by IR, which patient would like to have. They understand that they can contact this office at any time if questions or concerns.  During this consultation, patient's daughter became symptomatic from new blood pressure medication such that discussion was interrupted, tho we were able to complete the visit subsequently. Patient and family know that I am glad to speak further by phone if they would like also.   IMPRESSION / PLAN:  1.IA grade 3 mixed serous and endometrioid uterine carcinoma: post robotic hysterectomy/BSO/ pelvic and para aortic nodes at [facility]  06-23-14. Will begin q 3 week taxol carboplatin in next ~ 7-10 days following PAC placement. I will see her back ~ 1 week and ~ 2 weeks after first chemotherapy. WIll refer to genetics counselors upcoming. 2.peripheral IV access not adequate for chemtherapy: PAC by IR requested. 3. HTN, controlled 4.asthma intermittently symptomatic 5.post cholecystectomy 6.history of colon polyps, none on last colonoscopy 2013 7.history of diverticulitis and GERD 8.flu vaccine done 9.history benign positional vertigo    Patient and accompanying individuals have had questions answered to their satisfaction and are in agreement with plan above. They can contact this office for questions or concerns at any time prior to next scheduled visit.  Chemotherapy orders entered; message sent for preauthorization. Antiemetics as above. Verbal consent obtained. Message to RN to review premed steroids and antiemetics shortly prior to first treatment. Time spent 60 min, including >50% discussion and coordination of care.    Crickett Abbett P, MD 08/09/2014 10:50 AM

## 2014-08-10 ENCOUNTER — Telehealth: Payer: Self-pay | Admitting: *Deleted

## 2014-08-10 NOTE — Telephone Encounter (Deleted)
-----   Message from Gordy Levan, MD sent at 08/09/2014 12:13 PM EST ----- Please send to pharmacy  [facility] always fine  1.zofran 8 mg:  1 q 8 hr prn nausea. Will not make drowsy  #30  1RF  2.ativan 1 mg:  1 SL or po q 6 hr prn nausea. Will make drowsy  #20 NRF  3.decadron 4 mg:  Five tabs with food (=20mg ) 12 hrs prior to chemo and 5 tabs with    food (=20 mg) 6 hrs prior to chemo  #10 for first treatment only  4.EMLA   RN please call patient a few days before first chemo to review  thanks

## 2014-08-11 ENCOUNTER — Other Ambulatory Visit: Payer: Self-pay | Admitting: Radiology

## 2014-08-16 ENCOUNTER — Other Ambulatory Visit: Payer: Self-pay | Admitting: Oncology

## 2014-08-16 ENCOUNTER — Encounter (HOSPITAL_COMMUNITY): Payer: Self-pay

## 2014-08-16 ENCOUNTER — Ambulatory Visit (HOSPITAL_COMMUNITY)
Admission: RE | Admit: 2014-08-16 | Discharge: 2014-08-16 | Disposition: A | Discharge status: Home / Self Care | Payer: Commercial Managed Care - HMO | Source: Ambulatory Visit | Attending: Oncology | Admitting: Oncology

## 2014-08-16 DIAGNOSIS — C541 Malignant neoplasm of endometrium: Secondary | ICD-10-CM | POA: Diagnosis not present

## 2014-08-16 DIAGNOSIS — M1990 Unspecified osteoarthritis, unspecified site: Secondary | ICD-10-CM | POA: Diagnosis not present

## 2014-08-16 DIAGNOSIS — E079 Disorder of thyroid, unspecified: Secondary | ICD-10-CM | POA: Insufficient documentation

## 2014-08-16 DIAGNOSIS — Z791 Long term (current) use of non-steroidal anti-inflammatories (NSAID): Secondary | ICD-10-CM | POA: Diagnosis not present

## 2014-08-16 DIAGNOSIS — Z452 Encounter for adjustment and management of vascular access device: Secondary | ICD-10-CM | POA: Diagnosis present

## 2014-08-16 DIAGNOSIS — E785 Hyperlipidemia, unspecified: Secondary | ICD-10-CM | POA: Insufficient documentation

## 2014-08-16 DIAGNOSIS — K219 Gastro-esophageal reflux disease without esophagitis: Secondary | ICD-10-CM | POA: Insufficient documentation

## 2014-08-16 DIAGNOSIS — Z7952 Long term (current) use of systemic steroids: Secondary | ICD-10-CM | POA: Diagnosis not present

## 2014-08-16 DIAGNOSIS — Z7982 Long term (current) use of aspirin: Secondary | ICD-10-CM | POA: Diagnosis not present

## 2014-08-16 DIAGNOSIS — E669 Obesity, unspecified: Secondary | ICD-10-CM | POA: Insufficient documentation

## 2014-08-16 DIAGNOSIS — M5441 Lumbago with sciatica, right side: Secondary | ICD-10-CM | POA: Insufficient documentation

## 2014-08-16 DIAGNOSIS — Z7951 Long term (current) use of inhaled steroids: Secondary | ICD-10-CM | POA: Insufficient documentation

## 2014-08-16 DIAGNOSIS — Z79899 Other long term (current) drug therapy: Secondary | ICD-10-CM | POA: Diagnosis not present

## 2014-08-16 DIAGNOSIS — Z6841 Body Mass Index (BMI) 40.0 and over, adult: Secondary | ICD-10-CM | POA: Diagnosis not present

## 2014-08-16 DIAGNOSIS — I10 Essential (primary) hypertension: Secondary | ICD-10-CM | POA: Diagnosis not present

## 2014-08-16 DIAGNOSIS — J45909 Unspecified asthma, uncomplicated: Secondary | ICD-10-CM | POA: Diagnosis not present

## 2014-08-16 LAB — CBC WITH DIFFERENTIAL/PLATELET
BASOS ABS: 0 10*3/uL (ref 0.0–0.1)
Basophils Relative: 0 % (ref 0–1)
EOS ABS: 0.1 10*3/uL (ref 0.0–0.7)
EOS PCT: 2 % (ref 0–5)
HCT: 39.6 % (ref 36.0–46.0)
HEMOGLOBIN: 12.1 g/dL (ref 12.0–15.0)
LYMPHS PCT: 32 % (ref 12–46)
Lymphs Abs: 1.8 10*3/uL (ref 0.7–4.0)
MCH: 21.2 pg — AB (ref 26.0–34.0)
MCHC: 30.6 g/dL (ref 30.0–36.0)
MCV: 69.4 fL — ABNORMAL LOW (ref 78.0–100.0)
Monocytes Absolute: 0.4 10*3/uL (ref 0.1–1.0)
Monocytes Relative: 8 % (ref 3–12)
Neutro Abs: 3.2 10*3/uL (ref 1.7–7.7)
Neutrophils Relative %: 58 % (ref 43–77)
Platelets: 223 10*3/uL (ref 150–400)
RBC: 5.71 MIL/uL — ABNORMAL HIGH (ref 3.87–5.11)
RDW: 15.3 % (ref 11.5–15.5)
WBC: 5.5 10*3/uL (ref 4.0–10.5)

## 2014-08-16 LAB — PROTIME-INR
INR: 0.97 (ref 0.00–1.49)
Prothrombin Time: 13 seconds (ref 11.6–15.2)

## 2014-08-16 MED ORDER — HEPARIN SOD (PORK) LOCK FLUSH 100 UNIT/ML IV SOLN
INTRAVENOUS | Status: AC
  Filled 2014-08-16: qty 5

## 2014-08-16 MED ORDER — MIDAZOLAM HCL 2 MG/2ML IJ SOLN
INTRAMUSCULAR | Status: AC
Start: 2014-08-16 — End: 2014-08-16
  Filled 2014-08-16: qty 6

## 2014-08-16 MED ORDER — HEPARIN SOD (PORK) LOCK FLUSH 100 UNIT/ML IV SOLN
INTRAVENOUS | Status: AC | PRN
  Administered 2014-08-16: 500 [IU]

## 2014-08-16 MED ORDER — MIDAZOLAM HCL 2 MG/2ML IJ SOLN
INTRAMUSCULAR | Status: AC | PRN
  Administered 2014-08-16: 0.5 mg via INTRAVENOUS
  Administered 2014-08-16: 1 mg via INTRAVENOUS
  Administered 2014-08-16: 0.5 mg via INTRAVENOUS
  Administered 2014-08-16: 1 mg via INTRAVENOUS
  Administered 2014-08-16: 0.5 mg via INTRAVENOUS

## 2014-08-16 MED ORDER — FENTANYL CITRATE 0.05 MG/ML IJ SOLN
INTRAMUSCULAR | Status: AC | PRN
  Administered 2014-08-16: 25 ug via INTRAVENOUS
  Administered 2014-08-16: 50 ug via INTRAVENOUS
  Administered 2014-08-16: 25 ug via INTRAVENOUS

## 2014-08-16 MED ORDER — VANCOMYCIN HCL 10 G IV SOLR
1500.0000 mg | Freq: Once | INTRAVENOUS | Status: AC
Start: 2014-08-16 — End: 2014-08-16
  Administered 2014-08-16: 1500 mg via INTRAVENOUS
  Filled 2014-08-16: qty 1500

## 2014-08-16 MED ORDER — SODIUM CHLORIDE 0.9 % IV SOLN
INTRAVENOUS | Status: DC
  Administered 2014-08-16: 12:00:00 via INTRAVENOUS

## 2014-08-16 MED ORDER — LIDOCAINE HCL 1 % IJ SOLN
INTRAMUSCULAR | Status: AC
  Filled 2014-08-16: qty 20

## 2014-08-16 MED ORDER — FENTANYL CITRATE 0.05 MG/ML IJ SOLN
INTRAMUSCULAR | Status: AC
  Filled 2014-08-16: qty 4

## 2014-08-16 MED ORDER — LIDOCAINE-EPINEPHRINE 2 %-1:100000 IJ SOLN
INTRAMUSCULAR | Status: AC
  Filled 2014-08-16: qty 1

## 2014-08-16 NOTE — Progress Notes (Signed)
MEDICAL ONCOLOGY  NOTE OF INFORMATION  Patient is to begin adjuvant chemotherapy with every 3 week carboplatin and taxol beginning 08-24-2014. The majority of patients require gCSF support with this regimen, tho occasionally patients do not need gCSF.  Preauthorization for neupogen/ granix requested, which will be started if needed depending on blood counts after first chemotherapy.  Godfrey Pick, MD

## 2014-08-16 NOTE — Discharge Instructions (Signed)
Implanted Port Insertion, Care After °Refer to this sheet in the next few weeks. These instructions provide you with information on caring for yourself after your procedure. Your health care provider may also give you more specific instructions. Your treatment has been planned according to current medical practices, but problems sometimes occur. Call your health care provider if you have any problems or questions after your procedure. °WHAT TO EXPECT AFTER THE PROCEDURE °After your procedure, it is typical to have the following:  °· Discomfort at the port insertion site. Ice packs to the area will help. °· Bruising on the skin over the port. This will subside in 3-4 days. °HOME CARE INSTRUCTIONS °· After your port is placed, you will get a manufacturer's information card. The card has information about your port. Keep this card with you at all times.   °· Know what kind of port you have. There are many types of ports available.   °· Wear a medical alert bracelet in case of an emergency. This can help alert health care workers that you have a port.   °· The port can stay in for as long as your health care provider believes it is necessary.   °· A home health care nurse may give medicines and take care of the port.   °· You or a family member can get special training and directions for giving medicine and taking care of the port at home.   °SEEK MEDICAL CARE IF:  °· Your port does not flush or you are unable to get a blood return.   °· You have a fever or chills. °SEEK IMMEDIATE MEDICAL CARE IF: °· You have new fluid or pus coming from your incision.   °· You notice a bad smell coming from your incision site.   °· You have swelling, pain, or more redness at the incision or port site.   °· You have chest pain or shortness of breath. °Document Released: 04/29/2013 Document Revised: 07/14/2013 Document Reviewed: 04/29/2013 °ExitCare® Patient Information ©2015 [employer]. This information is not intended to replace  advice given to you by your health care provider. Make sure you discuss any questions you have with your health care provider. °Implanted Port Home Guide °An implanted port is a type of central line that is placed under the skin. Central lines are used to provide IV access when treatment or nutrition needs to be given through a person's veins. Implanted ports are used for long-term IV access. An implanted port may be placed because:  °· You need IV medicine that would be irritating to the small veins in your hands or arms.   °· You need long-term IV medicines, such as antibiotics.   °· You need IV nutrition for a long period.   °· You need frequent blood draws for lab tests.   °· You need dialysis.   °Implanted ports are usually placed in the chest area, but they can also be placed in the upper arm, the abdomen, or the leg. An implanted port has two main parts:  °· Reservoir. The reservoir is round and will appear as a small, raised area under your skin. The reservoir is the part where a needle is inserted to give medicines or draw blood.   °· Catheter. The catheter is a thin, flexible tube that extends from the reservoir. The catheter is placed into a large vein. Medicine that is inserted into the reservoir goes into the catheter and then into the vein.   °HOW WILL I CARE FOR MY INCISION SITE? °Do not get the incision site wet. Bathe or   shower as directed by your health care provider.  °HOW IS MY PORT ACCESSED? °Special steps must be taken to access the port:  °· Before the port is accessed, a numbing cream can be placed on the skin. This helps numb the skin over the port site.   °· Your health care provider uses a sterile technique to access the port. °· Your health care provider must put on a mask and sterile gloves. °· The skin over your port is cleaned carefully with an antiseptic and allowed to dry. °· The port is gently pinched between sterile gloves, and a needle is inserted into the port. °· Only  "non-coring" port needles should be used to access the port. Once the port is accessed, a blood return should be checked. This helps ensure that the port is in the vein and is not clogged.   °· If your port needs to remain accessed for a constant infusion, a clear (transparent) bandage will be placed over the needle site. The bandage and needle will need to be changed every week, or as directed by your health care provider.   °· Keep the bandage covering the needle clean and dry. Do not get it wet. Follow your health care provider's instructions on how to take a shower or bath while the port is accessed.   °· If your port does not need to stay accessed, no bandage is needed over the port.   °WHAT IS FLUSHING? °Flushing helps keep the port from getting clogged. Follow your health care provider's instructions on how and when to flush the port. Ports are usually flushed with saline solution or a medicine called heparin. The need for flushing will depend on how the port is used.  °· If the port is used for intermittent medicines or blood draws, the port will need to be flushed:   °· After medicines have been given.   °· After blood has been drawn.   °· As part of routine maintenance.   °· If a constant infusion is running, the port may not need to be flushed.   °HOW LONG WILL MY PORT STAY IMPLANTED? °The port can stay in for as long as your health care provider thinks it is needed. When it is time for the port to come out, surgery will be done to remove it. The procedure is similar to the one performed when the port was put in.  °WHEN SHOULD I SEEK IMMEDIATE MEDICAL CARE? °When you have an implanted port, you should seek immediate medical care if:  °· You notice a bad smell coming from the incision site.   °· You have swelling, redness, or drainage at the incision site.   °· You have more swelling or pain at the port site or the surrounding area.   °· You have a fever that is not controlled with medicine. °Document  Released: 07/09/2005 Document Revised: 04/29/2013 Document Reviewed: 03/16/2013 °ExitCare® Patient Information ©2015 [employer]. This information is not intended to replace advice given to you by your health care provider. Make sure you discuss any questions you have with your health care provider.Conscious Sedation, Adult, Care After °Refer to this sheet in the next few weeks. These instructions provide you with information on caring for yourself after your procedure. Your health care provider may also give you more specific instructions. Your treatment has been planned according to current medical practices, but problems sometimes occur. Call your health care provider if you have any problems or questions after your procedure. °WHAT TO EXPECT AFTER THE PROCEDURE  °After your procedure: °·   You may feel sleepy, clumsy, and have poor balance for several hours. °· Vomiting may occur if you eat too soon after the procedure. °HOME CARE INSTRUCTIONS °· Do not participate in any activities where you could become injured for at least 24 hours. Do not: °¨ Drive. °¨ Swim. °¨ Ride a bicycle. °¨ Operate heavy machinery. °¨ Cook. °¨ Use power tools. °¨ Climb ladders. °¨ Work from a high place. °· Do not make important decisions or sign legal documents until you are improved. °· If you vomit, drink water, juice, or soup when you can drink without vomiting. Make sure you have little or no nausea before eating solid foods. °· Only take over-the-counter or prescription medicines for pain, discomfort, or fever as directed by your health care provider. °· Make sure you and your family fully understand everything about the medicines given to you, including what side effects may occur. °· You should not drink alcohol, take sleeping pills, or take medicines that cause drowsiness for at least 24 hours. °· If you smoke, do not smoke without supervision. °· If you are feeling better, you may resume normal activities 24 hours after you  were sedated. °· Keep all appointments with your health care provider. °SEEK MEDICAL CARE IF: °· Your skin is pale or bluish in color. °· You continue to feel nauseous or vomit. °· Your pain is getting worse and is not helped by medicine. °· You have bleeding or swelling. °· You are still sleepy or feeling clumsy after 24 hours. °SEEK IMMEDIATE MEDICAL CARE IF: °· You develop a rash. °· You have difficulty breathing. °· You develop any type of allergic problem. °· You have a fever. °MAKE SURE YOU: °· Understand these instructions. °· Will watch your condition. °· Will get help right away if you are not doing well or get worse. °Document Released: 04/29/2013 Document Reviewed: 04/29/2013 °ExitCare® Patient Information ©2015 [employer]. This information is not intended to replace advice given to you by your health care provider. Make sure you discuss any questions you have with your health care provider. ° °

## 2014-08-16 NOTE — H&P (Addendum)
Chief Complaint: "I am here for a port."  Referring Physician(s): Livesay,Lennis P  History of Present Illness: Katelyn Fisher is a 64 y.o. 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 Marko Plume on 08/09/14 and scheduled today for image guided port a catheter placement. The patient has had a H&P performed within the last 30 days, all history, medications, and exam have been reviewed. The patient denies any interval changes since the H&P. She has previously tolerated sedation without complications and denies any history of sleep apnea.    Past Medical History  Diagnosis Date  . Asthma   . Hypertension   . Obesity   . Hx of colonic polyps   . GERD (gastroesophageal reflux disease)   . History of colonic diverticulitis   . Arthritis     back  . Hyperlipidemia   . Depression   . Low back pain radiating to right leg 08/18/2012  . Anxiety   . Benign paroxysmal positional vertigo 11/01/2012  . Thyroid disease 06/01/2013  . Cervical cancer screening 10/09/2010    Qualifier: Diagnosis of  By: Wynona Luna   . 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  . Preventative health care 12/09/2013    Past Surgical History  Procedure Laterality Date  . Cholecystectomy  1991  . Dilation and curettage of uterus    . Tubal ligation    . Appendectomy      Allergies: Oxycodone; Penicillins; Shellfish allergy; Iodine; and Sulfa antibiotics  Medications: Prior to Admission medications   Medication Sig Start Date End Date Taking? Authorizing Provider  albuterol (PROAIR HFA) 108 (90 BASE) MCG/ACT inhaler Inhale 2 puffs into the lungs 2 (two) times daily as needed. Patient taking differently: Inhale 2 puffs into the lungs 2 (two) times daily as needed for wheezing.  05/28/12  Yes Burnice Logan, MD  albuterol (PROVENTIL) (2.5 MG/3ML) 0.083% nebulizer solution Take 3 mLs (2.5 mg total) by nebulization every 6 (six) hours as needed for wheezing or shortness of  breath. 07/06/13  Yes Brunetta Jeans, PA-C  amLODipine (NORVASC) 10 MG tablet Take 1 tablet (10 mg total) by mouth daily. 05/27/14  Yes Mosie Lukes, MD  aspirin 81 MG tablet Take 81 mg by mouth daily.     Yes Historical Provider, MD  atorvastatin (LIPITOR) 20 MG tablet Take 0.5 tablets (10 mg total) by mouth daily. 08/06/13 08/17/14 Yes Mosie Lukes, MD  benazepril (LOTENSIN) 40 MG tablet Take 1 tablet (40 mg total) by mouth daily. 05/27/14  Yes Mosie Lukes, MD  budesonide-formoterol [facility]) 160-4.5 MCG/ACT inhaler Inhale 2 puffs into the lungs 2 (two) times daily. 05/21/12  Yes Burnice Logan, MD  dexamethasone (DECADRON) 4 MG tablet Take 5 tabs with food (=20mg ) 12 hrs prior to chemo and 6 hrs with food prior to chemo 08/09/14  Yes Lennis Marion Downer, MD  diclofenac sodium (VOLTAREN) 1 % GEL Apply 2 g topically 4 (four) times daily as needed.    Yes Historical Provider, MD  fluticasone (FLONASE) 50 MCG/ACT nasal spray Place 2 sprays into the nose daily. 02/06/13  Yes Brunetta Jeans, PA-C  furosemide (LASIX) 20 MG tablet Take 1 tablet (20 mg total) by mouth daily. 05/27/14  Yes Mosie Lukes, MD  HYDROcodone-homatropine [facility]) 5-1.5 MG/5ML syrup Take 5 mLs by mouth every 8 (eight) hours as needed for cough. 07/28/14  Yes Mosie Lukes, MD  levothyroxine (SYNTHROID, LEVOTHROID) 25 MCG tablet Take 1 tablet (25  mcg total) by mouth as directed. 1 tab daily except on Saturdays take 2 tabs 05/27/14  Yes Mosie Lukes, MD  lidocaine-prilocaine (EMLA) cream Apply 1-2 hrs to [facility] cath site  prior access as directed. 08/09/14  Yes Lennis Marion Downer, MD  LORazepam (ATIVAN) 1 MG tablet Place 1 tablet under the tongue or swallow every 6 hours as needed for nausea.  Will make you drowsy. 08/09/14  Yes Lennis Marion Downer, MD  meloxicam (MOBIC) 15 MG tablet Take 15 mg by mouth daily.   Yes Historical Provider, MD  Multiple Vitamin (MULTIVITAMIN) tablet Take 1 tablet by mouth daily.   Yes Historical Provider,  MD  Multiple Vitamins-Minerals (STRESS B-COMPLEX/C/ZINC PO) Take 1 tablet by mouth daily.   Yes Historical Provider, MD  nebivolol (BYSTOLIC) 10 MG tablet Take 1 tablet (10 mg total) by mouth 2 (two) times daily. 05/27/14  Yes Mosie Lukes, MD  ondansetron (ZOFRAN) 8 MG tablet Take 1 tablet (8 mg total) by mouth every 8 (eight) hours as needed for nausea or vomiting. Will  not make drowsy. 08/09/14  Yes Lennis Marion Downer, MD  pantoprazole (PROTONIX) 40 MG tablet Take 1 tablet (40 mg total) by mouth daily. 07/30/13  Yes Mosie Lukes, MD  pentoxifylline (TRENTAL) 400 MG CR tablet Take 400 mg by mouth 3 (three) times daily with meals.    Yes Historical Provider, MD  Probiotic Product (ALIGN) 4 MG CAPS Take 1 capsule by mouth daily.    Yes Historical Provider, MD  traMADol (ULTRAM) 50 MG tablet Take 50 mg by mouth every 6 (six) hours as needed for moderate pain.    Yes Historical Provider, MD  triamcinolone (KENALOG) 0.1 % cream Apply 1 application topically. Apply topically to affected area once daily    Yes Historical Provider, MD  montelukast (SINGULAIR) 10 MG tablet Take 1 tablet (10 mg total) by mouth at bedtime as needed. Patient not taking: Reported on 08/12/2014 12/02/12   Mosie Lukes, MD    Family History  Problem Relation Age of Onset  . Heart disease Maternal Aunt     x 3 -2 brothers  . Hypertension Maternal Aunt   . Cancer Maternal Aunt     2 maternal aunts  . Prostate cancer Father   . Diabetes Father   . Cancer Father     lung cancer, asbestos exposure  . Heart disease Brother   . Diabetes Brother   . Other      no FH colon cancer  . Diabetes Mother   . Lupus Mother   . Heart disease Brother   . Hyperlipidemia Sister   . Heart disease Brother   . Diabetes Brother   . Heart disease Brother   . Diabetes Brother     History   Social History  . Marital Status: Married    Spouse Name: N/A    Number of Children: N/A  . Years of Education: N/A   Social History Main  Topics  . Smoking status: Never Smoker   . Smokeless tobacco: Never Used  . Alcohol Use: No  . Drug Use: No  . Sexual Activity: Yes     Comment: lives with husband, no dietary restrictions   Other Topics Concern  . None   Social History Narrative   Married- 40 years   Never Smoked   Alcohol use-no   Drug use-no   Daily Caffeine Use   Occupation: housewife   Caffeine use/day:  None   Does  Patient Exercise:  yes    Review of Systems: A 12 point ROS discussed and pertinent positives are indicated in the HPI above.  All other systems are negative.  Review of Systems  Vital Signs: BP 144/88 mmHg  Pulse 79  Temp(Src) 98.2 F (36.8 C) (Oral)  Resp 18  Ht 5\' 2"  (1.575 m)  Wt 243 lb 14 oz (110.621 kg)  BMI 44.59 kg/m2  SpO2 100%  Physical Exam  Constitutional: She is oriented to person, place, and time. No distress.  HENT:  Head: Normocephalic and atraumatic.  Neck: No tracheal deviation present.  Cardiovascular: Normal rate and regular rhythm.  Exam reveals no gallop and no friction rub.   No murmur heard. Pulmonary/Chest: Effort normal and breath sounds normal. No respiratory distress. She has no wheezes. She has no rales.  Abdominal: Soft. Bowel sounds are normal. She exhibits no distension. There is no tenderness.  Neurological: She is alert and oriented to person, place, and time.  Skin: She is not diaphoretic.  Psychiatric: She has a normal mood and affect. Her behavior is normal. Thought content normal.    Imaging: No results found.  Labs:  CBC:  Recent Labs  11/27/13 0847 03/16/14 0853 06/16/14 0855 08/09/14 1020  WBC 5.3 4.3 6.0 5.9  HGB 12.5 12.2 12.4 12.2  HCT 39.9 40.0 40.7 40.9  PLT 252 231 219.0 210    COAGS: No results for input(s): INR, APTT in the last 8760 hours.  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 Marko Plume on 08/09/14 Scheduled today for image guided port a catheter placement with moderate sedation Patient has been NPO, afebrile, labs reviewed, no blood thinners taken Risks and Benefits discussed with the patient. All of the patient's questions were answered, patient is agreeable to proceed. Consent signed and in chart.   SignedHedy Jacob 08/16/2014, 11:58 AM

## 2014-08-16 NOTE — Procedures (Signed)
Successful RT IJ POWER PORT  SVC/RA NO COMP STABLE FULL REPORT IN PACS

## 2014-08-20 ENCOUNTER — Telehealth: Payer: Self-pay

## 2014-08-20 ENCOUNTER — Encounter: Payer: Self-pay | Admitting: Oncology

## 2014-08-20 NOTE — Telephone Encounter (Signed)
-----   Message from Gordy Levan, MD sent at 08/11/2014  9:53 PM EST ----- RN please speak with her by phone shortly prior to first long taxol carbo on 08-24-14. Review decadron premeds with food, 5 tabs 12 hrs and 6 hrs prior Review antiemetics: suggest using ativan night of rx whether or not nausea and zofran AM after Rx whether or not nausea Go over possible taxol aches. Review EMLA for PAC Be sure she knows how to call office  (This consultation was a little difficult as daughter had medication reaction during our visit)  thanks

## 2014-08-20 NOTE — Telephone Encounter (Signed)
Spoke with Ms. Lizardo and spent 20 minutes on the phone reviewing the premedication schedule and antiemetic recommendations as noted below by Dr. Marko Plume. Teach Back Method used. Reviewed application of EMLA cream/ Press and Seal  2 hrs prior to [facility] access. Ms. Sheaffer' daughter is doing fine. Her PCP told her she needs to eat proerly and stay well hydrated.

## 2014-08-20 NOTE — Progress Notes (Signed)
APPROVED [id number]..([place]) [id number]... 08/24/14-11/22/14

## 2014-08-23 ENCOUNTER — Other Ambulatory Visit: Payer: Self-pay | Admitting: *Deleted

## 2014-08-23 ENCOUNTER — Telehealth: Payer: Self-pay

## 2014-08-23 ENCOUNTER — Encounter: Payer: Self-pay | Admitting: Oncology

## 2014-08-23 DIAGNOSIS — C541 Malignant neoplasm of endometrium: Secondary | ICD-10-CM

## 2014-08-23 NOTE — Progress Notes (Signed)
Called the patient back and she just needed the billing ph#

## 2014-08-23 NOTE — Telephone Encounter (Signed)
Returned pt call re: home medications.  Let pt know it is ok to take her regular meds for blood pressure and thyroid prior to chemotherapy tomorrow.  Pt voiced understanding.

## 2014-08-24 ENCOUNTER — Other Ambulatory Visit (HOSPITAL_BASED_OUTPATIENT_CLINIC_OR_DEPARTMENT_OTHER): Payer: Commercial Managed Care - HMO

## 2014-08-24 ENCOUNTER — Ambulatory Visit (HOSPITAL_BASED_OUTPATIENT_CLINIC_OR_DEPARTMENT_OTHER): Payer: Commercial Managed Care - HMO

## 2014-08-24 DIAGNOSIS — C541 Malignant neoplasm of endometrium: Secondary | ICD-10-CM

## 2014-08-24 DIAGNOSIS — Z5111 Encounter for antineoplastic chemotherapy: Secondary | ICD-10-CM

## 2014-08-24 LAB — COMPREHENSIVE METABOLIC PANEL (CC13)
ALBUMIN: 3.8 g/dL (ref 3.5–5.0)
ALT: 23 U/L (ref 0–55)
ANION GAP: 10 meq/L (ref 3–11)
AST: 18 U/L (ref 5–34)
Alkaline Phosphatase: 98 U/L (ref 40–150)
BUN: 12.4 mg/dL (ref 7.0–26.0)
CALCIUM: 9.5 mg/dL (ref 8.4–10.4)
CO2: 22 mEq/L (ref 22–29)
Chloride: 107 mEq/L (ref 98–109)
Creatinine: 0.9 mg/dL (ref 0.6–1.1)
EGFR: 80 mL/min/{1.73_m2} — ABNORMAL LOW (ref >90)
Glucose: 261 mg/dl — ABNORMAL HIGH (ref 70–140)
POTASSIUM: 4.5 meq/L (ref 3.5–5.1)
Sodium: 139 mEq/L (ref 136–145)
TOTAL PROTEIN: 7.6 g/dL (ref 6.4–8.3)
Total Bilirubin: 0.27 mg/dL (ref 0.20–1.20)

## 2014-08-24 LAB — CBC WITH DIFFERENTIAL/PLATELET
BASO%: 0 % (ref 0.0–2.0)
Basophils Absolute: 0 10*3/uL (ref 0.0–0.1)
EOS ABS: 0 10*3/uL (ref 0.0–0.5)
EOS%: 0 % (ref 0.0–7.0)
HCT: 41.4 % (ref 34.8–46.6)
HGB: 12.7 g/dL (ref 11.6–15.9)
LYMPH#: 1 10*3/uL (ref 0.9–3.3)
LYMPH%: 16 % (ref 14.0–49.7)
MCH: 21.1 pg — AB (ref 25.1–34.0)
MCHC: 30.7 g/dL — ABNORMAL LOW (ref 31.5–36.0)
MCV: 68.8 fL — AB (ref 79.5–101.0)
MONO#: 0 10*3/uL — ABNORMAL LOW (ref 0.1–0.9)
MONO%: 0.3 % (ref 0.0–14.0)
NEUT#: 5 10*3/uL (ref 1.5–6.5)
NEUT%: 83.7 % — ABNORMAL HIGH (ref 38.4–76.8)
Platelets: 211 10*3/uL (ref 145–400)
RBC: 6.02 10*6/uL — ABNORMAL HIGH (ref 3.70–5.45)
RDW: 15.2 % — AB (ref 11.2–14.5)
WBC: 5.9 10*3/uL (ref 3.9–10.3)

## 2014-08-24 MED ORDER — ONDANSETRON 16 MG/50ML IVPB (CHCC)
INTRAVENOUS | Status: AC
  Filled 2014-08-24: qty 16

## 2014-08-24 MED ORDER — PACLITAXEL CHEMO INJECTION 300 MG/50ML
175.0000 mg/m2 | Freq: Once | INTRAVENOUS | Status: AC
  Administered 2014-08-24: 384 mg via INTRAVENOUS
  Filled 2014-08-24: qty 64

## 2014-08-24 MED ORDER — DIPHENHYDRAMINE HCL 50 MG/ML IJ SOLN
INTRAMUSCULAR | Status: AC
  Filled 2014-08-24: qty 1

## 2014-08-24 MED ORDER — FAMOTIDINE IN NACL 20-0.9 MG/50ML-% IV SOLN
20.0000 mg | Freq: Once | INTRAVENOUS | Status: AC
  Administered 2014-08-24: 20 mg via INTRAVENOUS

## 2014-08-24 MED ORDER — SODIUM CHLORIDE 0.9 % IJ SOLN
10.0000 mL | INTRAMUSCULAR | Status: DC | PRN
  Administered 2014-08-24: 10 mL
  Filled 2014-08-24: qty 10

## 2014-08-24 MED ORDER — FAMOTIDINE IN NACL 20-0.9 MG/50ML-% IV SOLN
INTRAVENOUS | Status: AC
  Filled 2014-08-24: qty 50

## 2014-08-24 MED ORDER — DIPHENHYDRAMINE HCL 50 MG/ML IJ SOLN
50.0000 mg | Freq: Once | INTRAMUSCULAR | Status: AC
  Administered 2014-08-24: 50 mg via INTRAVENOUS

## 2014-08-24 MED ORDER — SODIUM CHLORIDE 0.9 % IV SOLN
691.0000 mg | Freq: Once | INTRAVENOUS | Status: AC
  Administered 2014-08-24: 690 mg via INTRAVENOUS
  Filled 2014-08-24: qty 69

## 2014-08-24 MED ORDER — DEXAMETHASONE SODIUM PHOSPHATE 20 MG/5ML IJ SOLN
INTRAMUSCULAR | Status: AC
  Filled 2014-08-24: qty 5

## 2014-08-24 MED ORDER — SODIUM CHLORIDE 0.9 % IV SOLN
Freq: Once | INTRAVENOUS | Status: AC
  Administered 2014-08-24: 10:00:00 via INTRAVENOUS

## 2014-08-24 MED ORDER — ONDANSETRON 16 MG/50ML IVPB (CHCC)
16.0000 mg | Freq: Once | INTRAVENOUS | Status: AC
  Administered 2014-08-24: 16 mg via INTRAVENOUS

## 2014-08-24 MED ORDER — DEXAMETHASONE SODIUM PHOSPHATE 20 MG/5ML IJ SOLN
20.0000 mg | Freq: Once | INTRAMUSCULAR | Status: AC
  Administered 2014-08-24: 20 mg via INTRAVENOUS

## 2014-08-24 MED ORDER — HEPARIN SOD (PORK) LOCK FLUSH 100 UNIT/ML IV SOLN
500.0000 [IU] | Freq: Once | INTRAVENOUS | Status: AC | PRN
  Administered 2014-08-24: 500 [IU]
  Filled 2014-08-24: qty 5

## 2014-08-24 NOTE — Patient Instructions (Signed)
[facility] Discharge Instructions for Patients Receiving Chemotherapy  Today you received the following chemotherapy agents taxol and carboplatin.  To help prevent nausea and vomiting after your treatment, we encourage you to take your nausea medication zofran and ativan. Take a zofran before bed tonight and then one in the morning as instructed by Dr. Marko Plume.  After those doses, use the zofran as needed for nausea.  You can also use ativan, however it may make you sleepy.   If you develop nausea and vomiting that is not controlled by your nausea medication, call the clinic.   BELOW ARE SYMPTOMS THAT SHOULD BE REPORTED IMMEDIATELY:  *FEVER GREATER THAN 100.5 F  *CHILLS WITH OR WITHOUT FEVER  NAUSEA AND VOMITING THAT IS NOT CONTROLLED WITH YOUR NAUSEA MEDICATION  *UNUSUAL SHORTNESS OF BREATH  *UNUSUAL BRUISING OR BLEEDING  TENDERNESS IN MOUTH AND THROAT WITH OR WITHOUT PRESENCE OF ULCERS  *URINARY PROBLEMS  *BOWEL PROBLEMS  UNUSUAL RASH Items with * indicate a potential emergency and should be followed up as soon as possible.  Feel free to call the clinic you have any questions or concerns. The clinic phone number is [phone].

## 2014-08-25 ENCOUNTER — Telehealth: Payer: Self-pay

## 2014-08-25 ENCOUNTER — Telehealth: Payer: Self-pay | Admitting: Certified Registered Nurse Anesthetist

## 2014-08-25 NOTE — Telephone Encounter (Signed)
Return pt call asking about alternatives for lemonade that are not acidic. Talked about apple, grape and cranberry or putting cucumber in water.

## 2014-08-25 NOTE — Telephone Encounter (Signed)
Chemo f/u call: pt.is  doing well without any symptoms. She did mention about drinking lemon juice.  Advised pt. to avoid acidic type food and juice with potential mouthsores.  Mouth care reviewed in regards to use of baking soda/water, and avoid alcohol based mouth rinse. Reminded pt. to use the sample biotene given in chemo education class.  Reviewed nausea medications. Pt. did  have some questions about future appts. Reviewed process with her.  She will call [phone] if she has further questions.  HL

## 2014-08-27 ENCOUNTER — Ambulatory Visit: Payer: Commercial Managed Care - HMO | Admitting: Radiation Oncology

## 2014-08-27 ENCOUNTER — Ambulatory Visit: Payer: Commercial Managed Care - HMO

## 2014-08-27 ENCOUNTER — Other Ambulatory Visit: Payer: Self-pay | Admitting: *Deleted

## 2014-08-27 DIAGNOSIS — C541 Malignant neoplasm of endometrium: Secondary | ICD-10-CM

## 2014-08-29 ENCOUNTER — Other Ambulatory Visit: Payer: Self-pay | Admitting: Oncology

## 2014-08-30 ENCOUNTER — Telehealth: Payer: Self-pay | Admitting: Oncology

## 2014-08-30 ENCOUNTER — Encounter: Payer: Self-pay | Admitting: Oncology

## 2014-08-30 ENCOUNTER — Ambulatory Visit (HOSPITAL_BASED_OUTPATIENT_CLINIC_OR_DEPARTMENT_OTHER): Payer: Commercial Managed Care - HMO

## 2014-08-30 ENCOUNTER — Ambulatory Visit (HOSPITAL_BASED_OUTPATIENT_CLINIC_OR_DEPARTMENT_OTHER): Payer: Commercial Managed Care - HMO | Admitting: Oncology

## 2014-08-30 ENCOUNTER — Other Ambulatory Visit (HOSPITAL_BASED_OUTPATIENT_CLINIC_OR_DEPARTMENT_OTHER): Payer: Commercial Managed Care - HMO

## 2014-08-30 VITALS — BP 88/68 | HR 104 | Temp 99.2°F | Resp 19 | Ht 62.0 in | Wt 239.2 lb

## 2014-08-30 DIAGNOSIS — I951 Orthostatic hypotension: Secondary | ICD-10-CM

## 2014-08-30 DIAGNOSIS — T451X5A Adverse effect of antineoplastic and immunosuppressive drugs, initial encounter: Secondary | ICD-10-CM

## 2014-08-30 DIAGNOSIS — C541 Malignant neoplasm of endometrium: Secondary | ICD-10-CM

## 2014-08-30 DIAGNOSIS — R112 Nausea with vomiting, unspecified: Secondary | ICD-10-CM

## 2014-08-30 DIAGNOSIS — I10 Essential (primary) hypertension: Secondary | ICD-10-CM

## 2014-08-30 DIAGNOSIS — I959 Hypotension, unspecified: Secondary | ICD-10-CM

## 2014-08-30 DIAGNOSIS — Z9109 Other allergy status, other than to drugs and biological substances: Secondary | ICD-10-CM

## 2014-08-30 LAB — CBC WITH DIFFERENTIAL/PLATELET
BASO%: 0.3 % (ref 0.0–2.0)
Basophils Absolute: 0 10*3/uL (ref 0.0–0.1)
EOS ABS: 0.2 10*3/uL (ref 0.0–0.5)
EOS%: 6 % (ref 0.0–7.0)
HEMATOCRIT: 38.2 % (ref 34.8–46.6)
HEMOGLOBIN: 12 g/dL (ref 11.6–15.9)
LYMPH#: 0.8 10*3/uL — AB (ref 0.9–3.3)
LYMPH%: 19.9 % (ref 14.0–49.7)
MCH: 21.5 pg — ABNORMAL LOW (ref 25.1–34.0)
MCHC: 31.4 g/dL — ABNORMAL LOW (ref 31.5–36.0)
MCV: 68.5 fL — ABNORMAL LOW (ref 79.5–101.0)
MONO#: 0.1 10*3/uL (ref 0.1–0.9)
MONO%: 1.3 % (ref 0.0–14.0)
NEUT#: 2.8 10*3/uL (ref 1.5–6.5)
NEUT%: 72.5 % (ref 38.4–76.8)
Platelets: 130 10*3/uL — ABNORMAL LOW (ref 145–400)
RBC: 5.58 10*6/uL — AB (ref 3.70–5.45)
RDW: 14.5 % (ref 11.2–14.5)
WBC: 3.8 10*3/uL — ABNORMAL LOW (ref 3.9–10.3)
nRBC: 0 % (ref 0–0)

## 2014-08-30 LAB — COMPREHENSIVE METABOLIC PANEL (CC13)
ALBUMIN: 3.5 g/dL (ref 3.5–5.0)
ALK PHOS: 77 U/L (ref 40–150)
ALT: 31 U/L (ref 0–55)
AST: 17 U/L (ref 5–34)
Anion Gap: 10 mEq/L (ref 3–11)
BUN: 10.4 mg/dL (ref 7.0–26.0)
CHLORIDE: 104 meq/L (ref 98–109)
CO2: 26 meq/L (ref 22–29)
Calcium: 9 mg/dL (ref 8.4–10.4)
Creatinine: 0.9 mg/dL (ref 0.6–1.1)
EGFR: 81 mL/min/{1.73_m2} — AB (ref >90)
Glucose: 148 mg/dl — ABNORMAL HIGH (ref 70–140)
Potassium: 4.6 mEq/L (ref 3.5–5.1)
Sodium: 140 mEq/L (ref 136–145)
Total Bilirubin: 0.43 mg/dL (ref 0.20–1.20)
Total Protein: 6.9 g/dL (ref 6.4–8.3)

## 2014-08-30 MED ORDER — DEXAMETHASONE 4 MG PO TABS
ORAL_TABLET | ORAL | Status: DC
Start: 2014-08-30 — End: 2014-10-18

## 2014-08-30 MED ORDER — SODIUM CHLORIDE 0.9 % IV SOLN
INTRAVENOUS | Status: DC
  Administered 2014-08-30: 16:00:00 via INTRAVENOUS

## 2014-08-30 MED ORDER — TRAMADOL HCL 50 MG PO TABS: 50.0000 mg | ORAL_TABLET | Freq: Four times a day (QID) | ORAL | Status: DC | PRN

## 2014-08-30 NOTE — Telephone Encounter (Signed)
gv adn printed appt sched and avs forpt for Feb and March °

## 2014-08-30 NOTE — Patient Instructions (Signed)
Do not take lasix (furosemide) for now.   If blood pressure is <244 systolic (top number), do not take norvasc (amlodipine)  If blood pressure is <975 systolic, don't take either norvasc (amlodipine) or lotensin (benazepril)  Check blood pressure 3 times a day and bring the list of readings when you see MD

## 2014-08-30 NOTE — Progress Notes (Signed)
OFFICE PROGRESS NOTE   August 30, 2014   [place], Mosie Lukes, MD,Marie-Lynn Lequita Halt, J.Bardelas, Jari Pigg, Verl Blalock  INTERVAL HISTORY:  Patient is seen, together with husband, having had first adjuvant chemotherapy with every 3 week carboplatin taxol on 08-24-14.  She is to see Dr Isidore Moos in consultation on 09-03-14.  Patient has had intermittent nausea with some vomiting since chemotherapy, severe taxol aches beginning ~ day 3, one episode of brief syncope after vomiting, and is hypotensive and orthostatic today. Note she has taken usual norvasc 41m, lotensin 483mand lasix 20 mg daily, including this AM. Nausea with vomiting happened abruptly several days after chemo. Husband was with her, and she was immediately responsive after slipping from chair to floor without injury. Tramadol was helpful for taxol aches, which were mostly in legs; aching has resolved now. She did not try claritin for taxol aches. She is able to drink gingerale, lemonade, broth and has eaten crackers, but has not been pushing fluids. She has BP cuff at home, which she has not used. She has slightly sore right ear and slightly scratchy throat. She has not had temperature higher than present 99 at office now. Bowels are moving including today, tho not great.   She had PAC by IR prior to chemo, which functioned well.  Flu vaccine done   ONCOLOGIC HISTORY Patient presented to Dr LaDellis Filbertith new onset postmenopausal spotting. [place] reportedly had thickened endometrial stripe, with 3 fibroids largest 5x 5x 3.5 cm; endometrial biopsy 05-07-14 reportedly was concerning for at least FIGO grade 1 endometrial carcinoma. She had consultation with Dr RoDenman Georgen 05-20-14. Surgery was robotic hysterectomy, BSO, bilateral pelvic and para-aortic nodes [facility] Patient did well with surgery, discharged home on POD #1. 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 [facility] had no findings of concern for distant disease. [facility] with recommendations from that conference and from Dr BrSkeet Latchor 6 cycles of taxane platinum chemotherapy followed by vaginal brachytherapy, and for genetics counseling. Patient and husband attended chemotherapy teaching class prior to visit today. She has not had radiation oncology consultation as yet. [facility] [place]. Cycle 1 carboplatin taxol was given 2-08-24-14.    Review of systems as above, also: No other URI symptoms, no lower respiratory symptoms. No bleeding. Is voiding. No peripheral neuropathy. No LE swelling. Remainder of 10 point Review of Systems negative.  Objective:  Vital signs in last 24 hours:  BP 88/68 mmHg  Pulse 104  Temp(Src) 99.2 F (37.3 C) (Oral)  Resp 19  Ht 5' 2" (1.575 m)  Wt 239 lb 3.2 oz (108.5 kg)  BMI 43.74 kg/m2  SpO2 100% Supine BP 96/68 with HR 100, seated 104/74 with HR 100, standing 88/68 with HR 104.  Respirations not labored RA Alert, oriented and appropriate. Ambulatory slowly, then used WC to infusion.  No alopecia  HEENT:PERRL, sclerae not icteric. Oral mucosa a little dry without lesions, posterior pharynx with dull erythema, no exudate. Left TM clear, right TM dull, no erythema. Neck supple. No JVD.  Lymphatics:no cervical,surpaclavicular adenopathy Resp: clear to auscultation bilaterally and normal percussion bilaterally Cardio: regular rate and rhythm. No gallop. GI: soft, nontender, not distended, no mass or organomegaly. Normally active bowel sounds. Surgical incisions  not remarkable. Musculoskeletal/ Extremities: without pitting edema, cords, tenderness Neuro: no peripheral neuropathy. Otherwise nonfocal Skin without rash, ecchymosis, petechiae Portacath-without erythema or tenderness, minimal resolving ecchymosis.  Lab Results:  Results for orders placed or performed in visit on 08/30/14  CBC with Differential  Result Value Ref Range   WBC 3.8 (L) 3.9 - 10.3 10e3/uL   NEUT# 2.8 1.5 - 6.5 10e3/uL   HGB 12.0 11.6 - 15.9 g/dL   HCT 38.2 34.8 - 46.6 %   Platelets 130 (L) 145 - 400 10e3/uL   MCV 68.5 (L) 79.5 - 101.0 fL   MCH 21.5 (L) 25.1 - 34.0 pg   MCHC 31.4 (L) 31.5 - 36.0 g/dL   RBC 5.58 (H) 3.70 - 5.45 10e6/uL   RDW 14.5 11.2 - 14.5 %   lymph# 0.8 (L) 0.9 - 3.3 10e3/uL   MONO# 0.1 0.1 - 0.9 10e3/uL   Eosinophils Absolute 0.2 0.0 - 0.5 10e3/uL   Basophils Absolute 0.0 0.0 - 0.1 10e3/uL   NEUT% 72.5 38.4 - 76.8 %   LYMPH% 19.9 14.0 - 49.7 %   MONO% 1.3 0.0 - 14.0 %   EOS% 6.0 0.0 - 7.0 %   BASO% 0.3 0.0 - 2.0 %   nRBC 0 0 - 0 %  Comprehensive metabolic panel (Cmet) - [facility]  Result Value Ref Range   Sodium 140 136 - 145 mEq/L   Potassium 4.6 3.5 - 5.1 mEq/L   Chloride 104 98 - 109 mEq/L   CO2 26 22 - 29 mEq/L   Glucose 148 (H) 70 - 140 mg/dl   BUN 10.4 7.0 - 26.0 mg/dL   Creatinine 0.9 0.6 - 1.1 mg/dL   Total Bilirubin 0.43 0.20 - 1.20 mg/dL   Alkaline Phosphatase 77 40 - 150 U/L   AST 17 5 - 34 U/L   ALT 31 0 - 55 U/L   Total Protein 6.9 6.4 - 8.3 g/dL   Albumin 3.5 3.5 - 5.0 g/dL   Calcium 9.0 8.4 - 10.4 mg/dL   Anion Gap 10 3 - 11 mEq/L   EGFR 81 (L) >90 ml/min/1.73 m2   CBC discussed, counts not yet at nadir from cycle 1. Chemistries available after visit   Studies/Results:  No results found.  Medications: I have reviewed the patient's current medications. She will hold lasix. She will not take norvasc if systolic <144 and will not take lotensin if systolic <315 She will begin claritin today, for right ear and post nasal  drainage  DISCUSSION: medications as above, written and oral instructions for the lasix and antihypertensives. IVF today. Will recheck CBC and see RN with BP check on 2-11.   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 Counts not yet at nadir; with present problems will recheck CBC on 2-11, RN to assess patient at office including BP then. I will see her again with labs at least 2-15. Consultation with Dr Isidore Moos 2-12. 2.hypotension and orthostatic changes: IV NS today, push po fluids. Follow BP at least 3x daily at home, recheck here also on 2-11. Hold lasix, adjust other BP meds as noted for now. I will let Dr Charlett Blake know, and we are  glad for her to assist with BP meds. 3.PAC in 4.history of HTN, on present meds x years with previous good control 5.right TM dull, possibly allergic post nasal drainage: claritin daily for now (which may also help aches). Call if symptoms worse or any fever 6.asthma intermittent, GERD, benign positional vertigo 7.past colon polyps, hx diverticulitis 8.flu vaccine done 9.post cholecystectomy   Repeat vitals after IVF today 120/78 with HR 98 seated. Time spent 30 min including >50% counseling and coordination of care. Cc note to Dr Charlett Blake re BP meds and lasix.   LIVESAY,LENNIS P, MD   08/30/2014, 4:07 PM

## 2014-08-30 NOTE — Patient Instructions (Signed)
Dehydration, Adult Dehydration is when you lose more fluids from the body than you take in. Vital organs like the kidneys, brain, and heart cannot function without a proper amount of fluids and salt. Any loss of fluids from the body can cause dehydration.  CAUSES   Vomiting.  Diarrhea.  Excessive sweating.  Excessive urine output.  Fever. SYMPTOMS  Mild dehydration  Thirst.  Dry lips.  Slightly dry mouth. Moderate dehydration  Very dry mouth.  Sunken eyes.  Skin does not bounce back quickly when lightly pinched and released.  Dark urine and decreased urine production.  Decreased tear production.  Headache. Severe dehydration  Very dry mouth.  Extreme thirst.  Rapid, weak pulse (more than 100 beats per minute at rest).  Cold hands and feet.  Not able to sweat in spite of heat and temperature.  Rapid breathing.  Blue lips.  Confusion and lethargy.  Difficulty being awakened.  Minimal urine production.  No tears. DIAGNOSIS  Your caregiver will diagnose dehydration based on your symptoms and your exam. Blood and urine tests will help confirm the diagnosis. The diagnostic evaluation should also identify the cause of dehydration. TREATMENT  Treatment of mild or moderate dehydration can often be done at home by increasing the amount of fluids that you drink. It is best to drink small amounts of fluid more often. Drinking too much at one time can make vomiting worse. Refer to the home care instructions below. Severe dehydration needs to be treated at the hospital where you will probably be given intravenous (IV) fluids that contain water and electrolytes. HOME CARE INSTRUCTIONS   Ask your caregiver about specific rehydration instructions.  Drink enough fluids to keep your urine clear or pale yellow.  Drink small amounts frequently if you have nausea and vomiting.  Eat as you normally do.  Avoid:  Foods or drinks high in sugar.  Carbonated  drinks.  Juice.  Extremely hot or cold fluids.  Drinks with caffeine.  Fatty, greasy foods.  Alcohol.  Tobacco.  Overeating.  Gelatin desserts.  Wash your hands well to avoid spreading bacteria and viruses.  Only take over-the-counter or prescription medicines for pain, discomfort, or fever as directed by your caregiver.  Ask your caregiver if you should continue all prescribed and over-the-counter medicines.  Keep all follow-up appointments with your caregiver. SEEK MEDICAL CARE IF:  You have abdominal pain and it increases or stays in one area (localizes).  You have a rash, stiff neck, or severe headache.  You are irritable, sleepy, or difficult to awaken.  You are weak, dizzy, or extremely thirsty. SEEK IMMEDIATE MEDICAL CARE IF:   You are unable to keep fluids down or you get worse despite treatment.  You have frequent episodes of vomiting or diarrhea.  You have blood or green matter (bile) in your vomit.  You have blood in your stool or your stool looks black and tarry.  You have not urinated in 6 to 8 hours, or you have only urinated a small amount of very dark urine.  You have a fever.  You faint. MAKE SURE YOU:   Understand these instructions.  Will watch your condition.  Will get help right away if you are not doing well or get worse. Document Released: 07/09/2005 Document Revised: 10/01/2011 Document Reviewed: 02/26/2011 ExitCare Patient Information 2015 [employer]. This information is not intended to replace advice given to you by your health care provider. Make sure you discuss any questions you have with your health care   provider.  

## 2014-08-31 ENCOUNTER — Telehealth: Payer: Self-pay | Admitting: *Deleted

## 2014-08-31 NOTE — Telephone Encounter (Signed)
-----   Message from Gordy Levan, MD sent at 08/31/2014 10:34 AM EST ----- Katelyn Fisher -  she was having a hard time when I saw her yesterday, after first chemo 2-2. Details in note from yesterday. Could you please check on her by phone today? BP at home, right ear, nausea etc. Thank you

## 2014-08-31 NOTE — Telephone Encounter (Signed)
Called patient as noted below by Dr. Marko Plume. Patient states "I am feeling so much better today." She states she took Claritin last night when she got home and her sore throat and ear pain have resolved now. She took her BP at home this morning and states it was 120/84. She plans on checking it again around 2pm and then again tonight before bed. She adjusted BP medications as instructed by Dr. Marko Plume at yesterday's visit. She says she is not having any issues with nausea today and has been able to eat a little bit more food and is still trying to push PO fluids as much as possible.   Confirmed lab appt on 09/02/14 while on the phone - patient states she will be at that appt. Told patient if she has any issues or concerns before then to please give [place] a call - patient agreeable to this.

## 2014-09-01 ENCOUNTER — Other Ambulatory Visit: Payer: Self-pay | Admitting: Oncology

## 2014-09-01 DIAGNOSIS — C541 Malignant neoplasm of endometrium: Secondary | ICD-10-CM

## 2014-09-02 ENCOUNTER — Ambulatory Visit (HOSPITAL_BASED_OUTPATIENT_CLINIC_OR_DEPARTMENT_OTHER): Payer: Commercial Managed Care - HMO

## 2014-09-02 ENCOUNTER — Other Ambulatory Visit: Payer: Self-pay | Admitting: Oncology

## 2014-09-02 ENCOUNTER — Encounter: Payer: Self-pay | Admitting: Radiation Oncology

## 2014-09-02 ENCOUNTER — Other Ambulatory Visit (HOSPITAL_BASED_OUTPATIENT_CLINIC_OR_DEPARTMENT_OTHER): Payer: Commercial Managed Care - HMO

## 2014-09-02 ENCOUNTER — Telehealth: Payer: Self-pay | Admitting: Oncology

## 2014-09-02 VITALS — BP 120/73 | HR 94 | Temp 98.3°F

## 2014-09-02 DIAGNOSIS — D702 Other drug-induced agranulocytosis: Secondary | ICD-10-CM

## 2014-09-02 DIAGNOSIS — C541 Malignant neoplasm of endometrium: Secondary | ICD-10-CM

## 2014-09-02 DIAGNOSIS — Z5189 Encounter for other specified aftercare: Secondary | ICD-10-CM

## 2014-09-02 LAB — CBC WITH DIFFERENTIAL/PLATELET
BASO%: 1 % (ref 0.0–2.0)
BASOS ABS: 0 10*3/uL (ref 0.0–0.1)
EOS%: 4.4 % (ref 0.0–7.0)
Eosinophils Absolute: 0.1 10*3/uL (ref 0.0–0.5)
HCT: 36.7 % (ref 34.8–46.6)
HEMOGLOBIN: 11.4 g/dL — AB (ref 11.6–15.9)
LYMPH#: 1.2 10*3/uL (ref 0.9–3.3)
LYMPH%: 55.8 % — ABNORMAL HIGH (ref 14.0–49.7)
MCH: 21.1 pg — ABNORMAL LOW (ref 25.1–34.0)
MCHC: 31.1 g/dL — ABNORMAL LOW (ref 31.5–36.0)
MCV: 68 fL — ABNORMAL LOW (ref 79.5–101.0)
MONO#: 0.3 10*3/uL (ref 0.1–0.9)
MONO%: 13.1 % (ref 0.0–14.0)
NEUT#: 0.5 10*3/uL — CL (ref 1.5–6.5)
NEUT%: 25.7 % — ABNORMAL LOW (ref 38.4–76.8)
NRBC: 0 % (ref 0–0)
Platelets: 155 10*3/uL (ref 145–400)
RBC: 5.4 10*6/uL (ref 3.70–5.45)
RDW: 14.7 % — AB (ref 11.2–14.5)
WBC: 2.1 10*3/uL — AB (ref 3.9–10.3)

## 2014-09-02 MED ORDER — CIPROFLOXACIN HCL 250 MG PO TABS: 250.0000 mg | ORAL_TABLET | Freq: Two times a day (BID) | ORAL | Status: DC

## 2014-09-02 MED ORDER — TBO-FILGRASTIM 300 MCG/0.5ML ~~LOC~~ SOSY
300.0000 ug | PREFILLED_SYRINGE | Freq: Once | SUBCUTANEOUS | Status: AC
  Administered 2014-09-02: 300 ug via SUBCUTANEOUS
  Filled 2014-09-02: qty 0.5

## 2014-09-02 NOTE — Telephone Encounter (Signed)
, °

## 2014-09-02 NOTE — Patient Instructions (Signed)

## 2014-09-02 NOTE — Progress Notes (Addendum)
Katelyn Fisher is feeling fine. Afebrile 98.3 Reviewed neutropenic precations with patient.  Pt. To hold ASA 81 mg as platlets are low at 68.5K. Katelyn Fisher is not currently taking ASA.  No bleed noted and discussed calling the office if bleeding occurs. Chemotherapy Alert Card given to patient and reviewed instructions. Granix given as instructed and Cipro sent to her pharmacy x 3 days while counts low. Katelyn Fisher to have repeat CBC tomorrow to follow up her platlet count.

## 2014-09-02 NOTE — Progress Notes (Addendum)
GYN Location of Tumor / Histology: endometrial adenocarcinoma, mixed high grade  Dorathy A Bloxham presented Oct 2015 with symptoms of: post menopausal spotting  Biopsies revealed:  06/23/14 Uterus: mixed high grade carcinoma , see path in chart for details  Past/Anticipated interventions by Gyn/Onc surgery, if any: Dr Skeet Latch 06/23/14 TRH/BSO, WITH LYMPH NODE DISSECTION, last FU on 08/06/14, to see her in [place] in 3 months  Past/Anticipated interventions by medical oncology, if any: Dr Marko Plume: recommendations from that conference and from Dr Skeet Latch for 6 cycles of taxane platinum chemotherapy followed by vaginal brachytherapy, and for genetics counseling. Next appt with Dr Marko Plume 09/06/14 Cycle 1 carboplatin taxol was given 08-24-14, next chemo 09/14/14  Weight changes, if any: 7 lb weight loss in past year  Bowel/Bladder complaints, if any: none  Nausea/Vomiting, if any: no  Pain issues, if any:  no  SAFETY ISSUES:  Prior radiation? no  Pacemaker/ICD? no  Possible current pregnancy? no  Is the patient on methotrexate? 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 Wearing a mask per Dr Marko Plume due to low WBC, taking Cipro.

## 2014-09-02 NOTE — Addendum Note (Signed)
Addended by: Baruch Merl on: 09/02/2014 02:13 PM   Modules accepted: Orders, Medications

## 2014-09-03 ENCOUNTER — Other Ambulatory Visit: Payer: Self-pay | Admitting: Oncology

## 2014-09-03 ENCOUNTER — Encounter: Payer: Self-pay | Admitting: Radiation Oncology

## 2014-09-03 ENCOUNTER — Ambulatory Visit
Admission: RE | Admit: 2014-09-03 | Discharge: 2014-09-03 | Disposition: A | Discharge status: Home / Self Care | Payer: Commercial Managed Care - HMO | Source: Ambulatory Visit | Attending: Radiation Oncology | Admitting: Radiation Oncology

## 2014-09-03 ENCOUNTER — Ambulatory Visit: Payer: Commercial Managed Care - HMO

## 2014-09-03 ENCOUNTER — Ambulatory Visit (HOSPITAL_BASED_OUTPATIENT_CLINIC_OR_DEPARTMENT_OTHER): Payer: Commercial Managed Care - HMO

## 2014-09-03 ENCOUNTER — Telehealth: Payer: Self-pay

## 2014-09-03 ENCOUNTER — Other Ambulatory Visit (HOSPITAL_BASED_OUTPATIENT_CLINIC_OR_DEPARTMENT_OTHER): Payer: Commercial Managed Care - HMO

## 2014-09-03 DIAGNOSIS — Z791 Long term (current) use of non-steroidal anti-inflammatories (NSAID): Secondary | ICD-10-CM | POA: Diagnosis not present

## 2014-09-03 DIAGNOSIS — J45909 Unspecified asthma, uncomplicated: Secondary | ICD-10-CM | POA: Insufficient documentation

## 2014-09-03 DIAGNOSIS — K219 Gastro-esophageal reflux disease without esophagitis: Secondary | ICD-10-CM | POA: Insufficient documentation

## 2014-09-03 DIAGNOSIS — I10 Essential (primary) hypertension: Secondary | ICD-10-CM | POA: Diagnosis not present

## 2014-09-03 DIAGNOSIS — Z90710 Acquired absence of both cervix and uterus: Secondary | ICD-10-CM | POA: Insufficient documentation

## 2014-09-03 DIAGNOSIS — E079 Disorder of thyroid, unspecified: Secondary | ICD-10-CM | POA: Diagnosis not present

## 2014-09-03 DIAGNOSIS — Z79899 Other long term (current) drug therapy: Secondary | ICD-10-CM | POA: Diagnosis not present

## 2014-09-03 DIAGNOSIS — Z90722 Acquired absence of ovaries, bilateral: Secondary | ICD-10-CM | POA: Insufficient documentation

## 2014-09-03 DIAGNOSIS — C541 Malignant neoplasm of endometrium: Secondary | ICD-10-CM | POA: Insufficient documentation

## 2014-09-03 DIAGNOSIS — Z9049 Acquired absence of other specified parts of digestive tract: Secondary | ICD-10-CM | POA: Diagnosis not present

## 2014-09-03 DIAGNOSIS — Z7982 Long term (current) use of aspirin: Secondary | ICD-10-CM | POA: Diagnosis not present

## 2014-09-03 DIAGNOSIS — Z5189 Encounter for other specified aftercare: Secondary | ICD-10-CM

## 2014-09-03 DIAGNOSIS — E785 Hyperlipidemia, unspecified: Secondary | ICD-10-CM | POA: Insufficient documentation

## 2014-09-03 DIAGNOSIS — D702 Other drug-induced agranulocytosis: Secondary | ICD-10-CM

## 2014-09-03 HISTORY — DX: Malignant (primary) neoplasm, unspecified: C80.1

## 2014-09-03 LAB — CBC WITH DIFFERENTIAL/PLATELET
BASO%: 1.2 % (ref 0.0–2.0)
Basophils Absolute: 0 10*3/uL (ref 0.0–0.1)
EOS ABS: 0.1 10*3/uL (ref 0.0–0.5)
EOS%: 4.1 % (ref 0.0–7.0)
HEMATOCRIT: 37 % (ref 34.8–46.6)
HEMOGLOBIN: 11.4 g/dL — AB (ref 11.6–15.9)
LYMPH%: 45.4 % (ref 14.0–49.7)
MCH: 21.1 pg — ABNORMAL LOW (ref 25.1–34.0)
MCHC: 30.8 g/dL — ABNORMAL LOW (ref 31.5–36.0)
MCV: 68.5 fL — ABNORMAL LOW (ref 79.5–101.0)
MONO#: 0.8 10*3/uL (ref 0.1–0.9)
MONO%: 22.1 % — ABNORMAL HIGH (ref 0.0–14.0)
NEUT%: 27.2 % — ABNORMAL LOW (ref 38.4–76.8)
NEUTROS ABS: 0.9 10*3/uL — AB (ref 1.5–6.5)
NRBC: 1 % — AB (ref 0–0)
Platelets: 173 10*3/uL (ref 145–400)
RBC: 5.4 10*6/uL (ref 3.70–5.45)
RDW: 14.8 % — AB (ref 11.2–14.5)
WBC: 3.4 10*3/uL — ABNORMAL LOW (ref 3.9–10.3)
lymph#: 1.5 10*3/uL (ref 0.9–3.3)

## 2014-09-03 MED ORDER — TBO-FILGRASTIM 300 MCG/0.5ML ~~LOC~~ SOSY
300.0000 ug | PREFILLED_SYRINGE | Freq: Once | SUBCUTANEOUS | Status: AC
  Administered 2014-09-03: 300 ug via SUBCUTANEOUS
  Filled 2014-09-03: qty 0.5

## 2014-09-03 NOTE — Patient Instructions (Signed)

## 2014-09-03 NOTE — Telephone Encounter (Signed)
-----   Message from Gordy Levan, MD sent at 09/03/2014 10:47 AM EST ----- Labs seen and need follow up: counts noted 2-12, was given gCSF today. Counts all recovering.  She does NOT need gCSF injection on 2-13, so fine to cancel that. I have put in POF, just need to be sure patient knows.   thanks

## 2014-09-03 NOTE — Progress Notes (Signed)
Spoke with Katelyn Fisher and she denies any bleeding.  Told her her platlets  Are the same as yesterday 68 K.  She is feeling tired today but otherwise feels good.  She had a little aching in her legs from the granix last evening and this am.

## 2014-09-03 NOTE — Telephone Encounter (Signed)
Told Ms. Nehme the results of the labs and cancellation of appointment  For 09-04-14 as noted below by Dr. Marko Plume.

## 2014-09-04 ENCOUNTER — Ambulatory Visit: Payer: Commercial Managed Care - HMO

## 2014-09-04 ENCOUNTER — Emergency Department (HOSPITAL_BASED_OUTPATIENT_CLINIC_OR_DEPARTMENT_OTHER)
Admission: EM | Admit: 2014-09-04 | Discharge: 2014-09-04 | Disposition: A | Discharge status: Home / Self Care | Payer: Commercial Managed Care - HMO | Attending: Emergency Medicine | Admitting: Emergency Medicine

## 2014-09-04 ENCOUNTER — Encounter (HOSPITAL_BASED_OUTPATIENT_CLINIC_OR_DEPARTMENT_OTHER): Payer: Self-pay | Admitting: *Deleted

## 2014-09-04 DIAGNOSIS — Z791 Long term (current) use of non-steroidal anti-inflammatories (NSAID): Secondary | ICD-10-CM | POA: Diagnosis not present

## 2014-09-04 DIAGNOSIS — M79605 Pain in left leg: Secondary | ICD-10-CM | POA: Diagnosis present

## 2014-09-04 DIAGNOSIS — E669 Obesity, unspecified: Secondary | ICD-10-CM | POA: Diagnosis not present

## 2014-09-04 DIAGNOSIS — F329 Major depressive disorder, single episode, unspecified: Secondary | ICD-10-CM | POA: Insufficient documentation

## 2014-09-04 DIAGNOSIS — I10 Essential (primary) hypertension: Secondary | ICD-10-CM | POA: Insufficient documentation

## 2014-09-04 DIAGNOSIS — E785 Hyperlipidemia, unspecified: Secondary | ICD-10-CM | POA: Diagnosis not present

## 2014-09-04 DIAGNOSIS — M79604 Pain in right leg: Secondary | ICD-10-CM | POA: Insufficient documentation

## 2014-09-04 DIAGNOSIS — E079 Disorder of thyroid, unspecified: Secondary | ICD-10-CM | POA: Diagnosis not present

## 2014-09-04 DIAGNOSIS — Z880 Allergy status to penicillin: Secondary | ICD-10-CM | POA: Diagnosis not present

## 2014-09-04 DIAGNOSIS — Z86010 Personal history of colonic polyps: Secondary | ICD-10-CM | POA: Diagnosis not present

## 2014-09-04 DIAGNOSIS — K219 Gastro-esophageal reflux disease without esophagitis: Secondary | ICD-10-CM | POA: Insufficient documentation

## 2014-09-04 DIAGNOSIS — Z79899 Other long term (current) drug therapy: Secondary | ICD-10-CM | POA: Diagnosis not present

## 2014-09-04 DIAGNOSIS — T458X5A Adverse effect of other primarily systemic and hematological agents, initial encounter: Secondary | ICD-10-CM | POA: Diagnosis not present

## 2014-09-04 DIAGNOSIS — F419 Anxiety disorder, unspecified: Secondary | ICD-10-CM | POA: Insufficient documentation

## 2014-09-04 DIAGNOSIS — Z8669 Personal history of other diseases of the nervous system and sense organs: Secondary | ICD-10-CM | POA: Insufficient documentation

## 2014-09-04 DIAGNOSIS — Z8541 Personal history of malignant neoplasm of cervix uteri: Secondary | ICD-10-CM | POA: Diagnosis not present

## 2014-09-04 DIAGNOSIS — T50905A Adverse effect of unspecified drugs, medicaments and biological substances, initial encounter: Secondary | ICD-10-CM

## 2014-09-04 DIAGNOSIS — M1990 Unspecified osteoarthritis, unspecified site: Secondary | ICD-10-CM | POA: Diagnosis not present

## 2014-09-04 DIAGNOSIS — Z7982 Long term (current) use of aspirin: Secondary | ICD-10-CM | POA: Insufficient documentation

## 2014-09-04 DIAGNOSIS — J45909 Unspecified asthma, uncomplicated: Secondary | ICD-10-CM | POA: Diagnosis not present

## 2014-09-04 MED ORDER — ACETAMINOPHEN 500 MG PO TABS
1000.0000 mg | ORAL_TABLET | Freq: Once | ORAL | Status: AC
  Administered 2014-09-04: 1000 mg via ORAL
  Filled 2014-09-04: qty 2

## 2014-09-04 MED ORDER — TRAMADOL HCL 50 MG PO TABS
50.0000 mg | ORAL_TABLET | Freq: Once | ORAL | Status: AC
  Administered 2014-09-04: 50 mg via ORAL

## 2014-09-04 MED ORDER — TRAMADOL HCL 50 MG PO TABS
ORAL_TABLET | ORAL | Status: AC
  Administered 2014-09-04: 50 mg via ORAL
  Filled 2014-09-04: qty 1

## 2014-09-04 NOTE — ED Provider Notes (Signed)
CSN: [id number]     Arrival date & time 09/04/14  0254 History   First MD Initiated Contact with Patient 09/04/14 [phone]     Chief Complaint  Patient presents with  . Leg Pain    chemo card     (Consider location/radiation/quality/duration/timing/severity/associated sxs/prior Treatment) Patient is a 64 y.o. female presenting with leg pain. The history is provided by the patient.  Leg Pain Location:  Leg Injury: no   Leg location:  L leg and R leg Pain details:    Quality:  Aching   Radiates to:  Does not radiate   Severity:  Severe   Onset quality:  Sudden   Timing:  Constant   Progression:  Improving Chronicity:  Recurrent (had this pain during her chemo infusion on 2/2 and some of it with granix injection) Dislocation: no   Prior injury to area:  No Relieved by:  Nothing Worsened by:  Nothing tried Associated symptoms: no back pain, no decreased ROM, no fatigue, no fever, no itching, no muscle weakness, no neck pain, no numbness, no stiffness, no swelling and no tingling   Risk factors: no concern for non-accidental trauma     Past Medical History  Diagnosis Date  . Asthma   . Hypertension   . Obesity   . Hx of colonic polyps   . GERD (gastroesophageal reflux disease)   . History of colonic diverticulitis   . Arthritis     back  . Hyperlipidemia   . Depression   . Low back pain radiating to right leg 08/18/2012  . Anxiety   . Benign paroxysmal positional vertigo 11/01/2012  . Thyroid disease 06/01/2013  . Cervical cancer screening 10/09/2010    Qualifier: Diagnosis of  By: Wynona Luna   . 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  . Preventative health care 12/09/2013  . Cancer     endometrial   Past Surgical History  Procedure Laterality Date  . Cholecystectomy  1991  . Dilation and curettage of uterus    . Tubal ligation    . Appendectomy    . Abdominal hysterectomy  06/23/14    [facility], TRH/BSO   Family History  Problem Relation Age of Onset  . Heart disease  Maternal Aunt     x 3 -2 brothers  . Hypertension Maternal Aunt   . Cancer Maternal Aunt     2 maternal aunts, breast and ovarian  . Prostate cancer Father   . Diabetes Father   . Cancer Father     lung cancer, asbestos exposure  . Heart disease Brother   . Diabetes Brother   . Other      no FH colon cancer  . Diabetes Mother   . Lupus Mother   . Heart disease Brother   . Hyperlipidemia Sister   . Heart disease Brother   . Diabetes Brother   . Heart disease Brother   . Diabetes Brother    History  Substance Use Topics  . Smoking status: Never Smoker   . Smokeless tobacco: Never Used  . Alcohol Use: No   OB History    No data available     Review of Systems  Constitutional: Negative for fever, chills and fatigue.  Respiratory: Negative for cough, choking, chest tightness and shortness of breath.   Cardiovascular: Negative for chest pain, palpitations and leg swelling.  Musculoskeletal: Positive for arthralgias. Negative for back pain, stiffness and neck pain.  Skin: Negative for itching.  All other systems reviewed and are  negative.     Allergies  Oxycodone; Penicillins; Shellfish allergy; Iodine; and Sulfa antibiotics  Home Medications   Prior to Admission medications   Medication Sig Start Date End Date Taking? Authorizing Provider  albuterol (PROAIR HFA) 108 (90 BASE) MCG/ACT inhaler Inhale 2 puffs into the lungs 2 (two) times daily as needed. Patient not taking: Reported on 08/30/2014 05/28/12   Burnice Logan, MD  albuterol (PROVENTIL) (2.5 MG/3ML) 0.083% nebulizer solution Take 3 mLs (2.5 mg total) by nebulization every 6 (six) hours as needed for wheezing or shortness of breath. Patient not taking: Reported on 08/30/2014 07/06/13   Brunetta Jeans, PA-C  amLODipine (NORVASC) 10 MG tablet Take 1 tablet (10 mg total) by mouth daily. 05/27/14   Mosie Lukes, MD  aspirin 81 MG tablet Take 81 mg by mouth daily.      Historical Provider, MD  atorvastatin  (LIPITOR) 20 MG tablet Take 10 mg by mouth daily.    Historical Provider, MD  benazepril (LOTENSIN) 40 MG tablet Take 1 tablet (40 mg total) by mouth daily. 05/27/14   Mosie Lukes, MD  budesonide-formoterol [facility]) 160-4.5 MCG/ACT inhaler Inhale 2 puffs into the lungs 2 (two) times daily. Patient not taking: Reported on 08/30/2014 05/21/12   Burnice Logan, MD  ciprofloxacin (CIPRO) 250 MG tablet Take 1 tablet (250 mg total) by mouth 2 (two) times daily. 09/02/14   Lennis Marion Downer, MD  dexamethasone (DECADRON) 4 MG tablet Take 5 tabs with food (=20mg ) 12 hrs prior to chemo and 6 hrs with food prior to chemo 08/30/14   Lennis Marion Downer, MD  diclofenac sodium (VOLTAREN) 1 % GEL Apply 2 g topically 4 (four) times daily as needed.     Historical Provider, MD  fluticasone (FLONASE) 50 MCG/ACT nasal spray Place 2 sprays into the nose daily. Patient not taking: Reported on 08/30/2014 02/06/13   Brunetta Jeans, PA-C  furosemide (LASIX) 20 MG tablet Take 1 tablet (20 mg total) by mouth daily. Patient not taking: Reported on 09/03/2014 05/27/14   Mosie Lukes, MD  HYDROcodone-homatropine [facility]) 5-1.5 MG/5ML syrup Take 5 mLs by mouth every 8 (eight) hours as needed for cough. Patient not taking: Reported on 08/30/2014 07/28/14   Mosie Lukes, MD  levothyroxine (SYNTHROID, LEVOTHROID) 25 MCG tablet Take 1 tablet (25 mcg total) by mouth as directed. 1 tab daily except on Saturdays take 2 tabs 05/27/14   Mosie Lukes, MD  lidocaine-prilocaine (EMLA) cream Apply 1-2 hrs to [facility] cath site  prior access as directed. 08/09/14   Lennis Marion Downer, MD  LORazepam (ATIVAN) 1 MG tablet Place 1 tablet under the tongue or swallow every 6 hours as needed for nausea.  Will make you drowsy. 08/09/14   Lennis Marion Downer, MD  meloxicam (MOBIC) 15 MG tablet Take 15 mg by mouth daily as needed.     Historical Provider, MD  Multiple Vitamin (MULTIVITAMIN) tablet Take 1 tablet by mouth daily.    Historical Provider, MD  Multiple  Vitamins-Minerals (STRESS B-COMPLEX/C/ZINC PO) Take 1 tablet by mouth daily.    Historical Provider, MD  nebivolol (BYSTOLIC) 10 MG tablet Take 1 tablet (10 mg total) by mouth 2 (two) times daily. 05/27/14   Mosie Lukes, MD  ondansetron (ZOFRAN) 8 MG tablet Take 1 tablet (8 mg total) by mouth every 8 (eight) hours as needed for nausea or vomiting. Will  not make drowsy. 08/09/14   Lennis Marion Downer, MD  pantoprazole (PROTONIX) 40  MG tablet Take 1 tablet (40 mg total) by mouth daily. 07/30/13   Mosie Lukes, MD  pentoxifylline (TRENTAL) 400 MG CR tablet Take 400 mg by mouth 2 (two) times daily.     Historical Provider, MD  Probiotic Product (ALIGN) 4 MG CAPS Take 1 capsule by mouth daily.     Historical Provider, MD  traMADol (ULTRAM) 50 MG tablet Take 1 tablet (50 mg total) by mouth every 6 (six) hours as needed for moderate pain. 08/30/14   Lennis Marion Downer, MD  triamcinolone (KENALOG) 0.1 % cream Apply 1 application topically. Apply topically to affected area once daily     Historical Provider, MD   BP 139/95 mmHg  Pulse 97  Temp(Src) 99 F (37.2 C) (Oral)  Resp 18  Ht 5\' 2"  (1.575 m)  Wt 240 lb (108.863 kg)  BMI 43.89 kg/m2  SpO2 98% Physical Exam  Constitutional: She is oriented to person, place, and time. She appears well-developed and well-nourished. No distress.  HENT:  Head: Normocephalic and atraumatic.  Mouth/Throat: Oropharynx is clear and moist. No oropharyngeal exudate.  Eyes: Conjunctivae and EOM are normal. Pupils are equal, round, and reactive to light.  Neck: Normal range of motion. Neck supple.  Cardiovascular: Normal rate, regular rhythm and intact distal pulses.   No murmurs.    Pulmonary/Chest: Effort normal and breath sounds normal. No respiratory distress. She has no wheezes. She has no rales.  Abdominal: Soft. Bowel sounds are normal. There is no tenderness. There is no rebound and no guarding.  Musculoskeletal: Normal range of motion. She exhibits no edema or  tenderness.  2= femoral and dorsalis pedis B, no cords.  No edema FROM.  No effusions of the ankles or knees.  Negative anterior and posterior drawer tests of B knees  No breaks in the skin.  Skin is warm and dry.  Legs are symmetric in warmth.  No erythema.  No rashes.  All compartments are soft  Neurological: She is alert and oriented to person, place, and time. She has normal reflexes. She displays normal reflexes. She exhibits normal muscle tone.  Skin: Skin is warm and dry. No rash noted. She is not diaphoretic. No erythema. No pallor.  No splinter hemorrhages no osler nodes or janeway lesions  Psychiatric: She has a normal mood and affect.    ED Course  Procedures (including critical care time) Labs Review Labs Reviewed - No data to display  Imaging Review No results found.   EKG Interpretation None      MDM   Final diagnoses:  None   Normal labs within the past 18 hours.  Did receive Granix 2/12 which can also cause aches in joints 340 am case d/w Dr, Marin Olp of oncology.  Given patient's WBC count and ANC within the past 24 hours she does not need to be on the cipro as this may be the source of the pain.  The carboplatin and granix can also cause pain.  Hold cipro and have patient follow up   Patient informed of discussion with Dr. Marin Olp.  Stop cipro and follow up.  Strict return precautions given.       Carlisle Beers, MD 09/04/14 [phone]

## 2014-09-04 NOTE — ED Notes (Signed)
Dr. Palumbo at [facility] 

## 2014-09-04 NOTE — Progress Notes (Signed)
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 Endometrial cancer 182.0 C54.1 Ambulatory referral to Social Work    HISTORY OF PRESENT ILLNESS::Katelyn Fisher is a 64 y.o. female who presented in Oct 2015 with symptoms of: post menopausal bleeding.  Biopsies revealed: 06/23/14 Uterus: mixed high grade carcinoma.  Dr Skeet Latch on 06/23/14 performed  total hysterectomy and BSO; pathology showed FIGO grade 3 mixed high grade carcinoma (50% serous 50% endometrioid adenocarcinoma).  17% invasion of myometrium, 0/15 nodes, +LVSI. pT1aN0.  PET/CT at [facility] on 07-30-14 was negative for metastatic / residual disease (per report).  Past/Anticipated interventions by medical oncology, if any: Dr Marko Plume: Cycle 1 carboplatin taxol was given 08-24-14, next chemo 09/14/14.  Per notes, tumor board recommendation was for adjuvant chemotherapy, then vaginal cuff brachytherapy.  Weight changes, if any: 7 lb weight loss in past year  Bowel/Bladder complaints, if any: none  Nausea/Vomiting, if any: no  Pain issues, if any:  no  SAFETY ISSUES:  Prior radiation? no  Pacemaker/ICD? no  Possible current pregnancy? no  Is the patient on methotrexate? 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 She denies pain, vaginal discharge.  Here with husband.  PREVIOUS RADIATION THERAPY: No  PAST MEDICAL HISTORY:  has a past medical history of Asthma; Hypertension; Obesity; colonic polyps; GERD (gastroesophageal reflux disease); History of colonic diverticulitis; Arthritis; Hyperlipidemia; Depression; Low back pain radiating to  right leg (08/18/2012); Anxiety; Benign paroxysmal positional vertigo (11/01/2012); Thyroid disease (06/01/2013); Cervical cancer screening (10/09/2010); Lipodermatosclerosis (12/07/2013); Preventative health care (12/09/2013); and Cancer.    PAST SURGICAL HISTORY: Past Surgical History  Procedure Laterality Date  . Cholecystectomy  1991  . Dilation and curettage of uterus    . Tubal ligation    . Appendectomy    . Abdominal hysterectomy  06/23/14    [facility], TRH/BSO    FAMILY HISTORY: family history includes Cancer in her father and maternal aunt; Diabetes in her brother, brother, brother, father, and mother; Heart disease in her brother, brother, brother, brother, and maternal aunt; Hyperlipidemia in her sister; Hypertension in her maternal aunt; Lupus in her mother; Other in an other family member; Prostate cancer in her father.  SOCIAL HISTORY:  reports that she has never smoked. She has never used smokeless tobacco. She reports that she does not drink alcohol or use illicit drugs.  ALLERGIES: Oxycodone; Penicillins; Shellfish allergy; Iodine; and Sulfa antibiotics  MEDICATIONS:  Current Outpatient Prescriptions  Medication Sig Dispense Refill  . amLODipine (NORVASC) 10 MG tablet Take 1 tablet (10 mg total) by mouth daily. 90 tablet 2  . atorvastatin (LIPITOR) 20 MG tablet Take 10 mg by mouth daily.    . benazepril (LOTENSIN) 40 MG tablet Take 1 tablet (40 mg total) by mouth daily. 90 tablet 2  . ciprofloxacin (CIPRO) 250 MG tablet Take 1 tablet (250 mg total) by mouth 2 (two) times daily. 6 tablet 0  . dexamethasone (DECADRON) 4 MG tablet Take 5 tabs with food (=20mg ) 12 hrs prior to chemo and 6 hrs with food prior to chemo 20 tablet 1  . diclofenac sodium (VOLTAREN)  1 % GEL Apply 2 g topically 4 (four) times daily as needed.     Marland Kitchen levothyroxine (SYNTHROID, LEVOTHROID) 25 MCG tablet Take 1 tablet (25 mcg total) by mouth as directed. 1 tab daily except on Saturdays take 2 tabs 102 tablet 1    . lidocaine-prilocaine (EMLA) cream Apply 1-2 hrs to [facility] cath site  prior access as directed. 30 g 1  . LORazepam (ATIVAN) 1 MG tablet Place 1 tablet under the tongue or swallow every 6 hours as needed for nausea.  Will make you drowsy. 20 tablet 0  . meloxicam (MOBIC) 15 MG tablet Take 15 mg by mouth daily as needed.     . Multiple Vitamin (MULTIVITAMIN) tablet Take 1 tablet by mouth daily.    . Multiple Vitamins-Minerals (STRESS B-COMPLEX/C/ZINC PO) Take 1 tablet by mouth daily.    . nebivolol (BYSTOLIC) 10 MG tablet Take 1 tablet (10 mg total) by mouth 2 (two) times daily. 180 tablet 1  . ondansetron (ZOFRAN) 8 MG tablet Take 1 tablet (8 mg total) by mouth every 8 (eight) hours as needed for nausea or vomiting. Will  not make drowsy. 30 tablet 01  . pantoprazole (PROTONIX) 40 MG tablet Take 1 tablet (40 mg total) by mouth daily. 90 tablet 2  . pentoxifylline (TRENTAL) 400 MG CR tablet Take 400 mg by mouth 2 (two) times daily.     . Probiotic Product (ALIGN) 4 MG CAPS Take 1 capsule by mouth daily.     . traMADol (ULTRAM) 50 MG tablet Take 1 tablet (50 mg total) by mouth every 6 (six) hours as needed for moderate pain. 30 tablet 0  . triamcinolone (KENALOG) 0.1 % cream Apply 1 application topically. Apply topically to affected area once daily     . albuterol (PROAIR HFA) 108 (90 BASE) MCG/ACT inhaler Inhale 2 puffs into the lungs 2 (two) times daily as needed. (Patient not taking: Reported on 08/30/2014) 3 Inhaler 2  . albuterol (PROVENTIL) (2.5 MG/3ML) 0.083% nebulizer solution Take 3 mLs (2.5 mg total) by nebulization every 6 (six) hours as needed for wheezing or shortness of breath. (Patient not taking: Reported on 08/30/2014) 150 mL 1  . aspirin 81 MG tablet Take 81 mg by mouth daily.      . budesonide-formoterol (SYMBICORT) 160-4.5 MCG/ACT inhaler Inhale 2 puffs into the lungs 2 (two) times daily. (Patient not taking: Reported on 08/30/2014) 3 Inhaler 2  . fluticasone (FLONASE) 50 MCG/ACT nasal  spray Place 2 sprays into the nose daily. (Patient not taking: Reported on 08/30/2014) 16 g 0  . furosemide (LASIX) 20 MG tablet Take 1 tablet (20 mg total) by mouth daily. (Patient not taking: Reported on 09/03/2014) 90 tablet 2  . HYDROcodone-homatropine (HYCODAN) 5-1.5 MG/5ML syrup Take 5 mLs by mouth every 8 (eight) hours as needed for cough. (Patient not taking: Reported on 08/30/2014) 120 mL 0   No current facility-administered medications for this encounter.    REVIEW OF SYSTEMS:  Notable for that above.   PHYSICAL EXAM:  height is 5\' 2"  (1.575 m) and weight is 244 lb (110.678 kg). Her oral temperature is 98.1 F (36.7 C). Her blood pressure is 124/78 and her pulse is 85. Her respiration is 20.   General: Alert and oriented, in no acute distress HEENT: Head is normocephalic. Extraocular movements are intact. Oropharynx is clear. Neck: Neck is supple, no palpable cervical or supraclavicular lymphadenopathy. Heart: Regular in rate and rhythm with no murmurs, rubs, or gallops. Chest: Clear to  auscultation bilaterally, with no rhonchi, wheezes, or rales. Abdomen: Soft, nontender, nondistended, with no rigidity or guarding. Extremities: No cyanosis or edema. Lymphatics: see Neck Exam Skin: No concerning lesions. Musculoskeletal: symmetric strength and muscle tone throughout. Neurologic: Cranial nerves II through XII are grossly intact. No obvious focalities. Speech is fluent. Coordination is intact. Psychiatric: Judgment and insight are intact. Affect is appropriate. Vaginal/GYN: no lesions over external genitalia or within vaginal cuff appreciated on digital/speculum exam   ECOG = 1  0 - Asymptomatic (Fully active, able to carry on all predisease activities without restriction)  1 - Symptomatic but completely ambulatory (Restricted in physically strenuous activity but ambulatory and able to carry out work of a light or sedentary nature. For example, light housework, office work)  2 -  Symptomatic, <50% in bed during the day (Ambulatory and capable of all self care but unable to carry out any work activities. Up and about more than 50% of waking hours)  3 - Symptomatic, >50% in bed, but not bedbound (Capable of only limited self-care, confined to bed or chair 50% or more of waking hours)  4 - Bedbound (Completely disabled. Cannot carry on any self-care. Totally confined to bed or chair)  5 - Death   Eustace Pen MM, Creech RH, Tormey DC, et al. [phone]). "Toxicity and response criteria of the [facility]". [place] Oncol. 5 (6): 649-55   LABORATORY DATA:  Lab Results  Component Value Date   WBC 2.1* 09/02/2014   HGB 11.4* 09/02/2014   HCT 36.7 09/02/2014   MCV 68.0* 09/02/2014   PLT 155 09/02/2014   CMP     Component Value Date/Time   NA 140 08/30/2014 1359   NA 141 06/16/2014 0855   K 4.6 08/30/2014 1359   K 4.5 06/16/2014 0855   CL 105 06/16/2014 0855   CO2 26 08/30/2014 1359   CO2 25 06/16/2014 0855   GLUCOSE 148* 08/30/2014 1359   GLUCOSE 95 06/16/2014 0855   GLUCOSE 69* 05/10/2006 1253   BUN 10.4 08/30/2014 1359   BUN 14 06/16/2014 0855   CREATININE 0.9 08/30/2014 1359   CREATININE 0.8 06/16/2014 0855   CREATININE 0.84 03/16/2014 0853   CALCIUM 9.0 08/30/2014 1359   CALCIUM 9.1 06/16/2014 0855   PROT 6.9 08/30/2014 1359   PROT 7.1 06/16/2014 0855   ALBUMIN 3.5 08/30/2014 1359   ALBUMIN 3.8 06/16/2014 0855   ALBUMIN 3.8 06/16/2014 0855   AST 17 08/30/2014 1359   AST 24 06/16/2014 0855   ALT 31 08/30/2014 1359   ALT 24 06/16/2014 0855   ALKPHOS 77 08/30/2014 1359   ALKPHOS 80 06/16/2014 0855   BILITOT 0.43 08/30/2014 1359   BILITOT 0.3 06/16/2014 0855   GFRNONAA >90 10/14/2012 1250   GFRAA >90 10/14/2012 1250         RADIOGRAPHY: Ir Fluoro Guide Cv Line Right  08/16/2014   CLINICAL DATA:  Endometrial cancer, access for chemotherapy  EXAM: RIGHT INTERNAL JUGULAR SINGLE LUMEN POWER PORT CATHETER INSERTION  Date:   1/25/20161/25/2016 2:12 pm  Radiologist:  M. Daryll Brod, MD  Guidance:  ULTRASOUND AND FLUOROSCOPIC  FLUOROSCOPY TIME:  30 seconds  MEDICATIONS AND MEDICAL HISTORY: 1.5 g of vancomycinadministered within 1 hour of the procedure.3.5 mg versed, 100 mcg fentanyl  ANESTHESIA/SEDATION: 32 min  CONTRAST:  None.  COMPLICATIONS: None  PROCEDURE: Informed consent was obtained from the patient following explanation of the procedure, risks, benefits and alternatives. The patient understands, agrees and consents for the procedure.  All questions were addressed. A time out was performed.  Maximal barrier sterile technique utilized including caps, mask, sterile gowns, sterile gloves, large sterile drape, hand hygiene, and 2% chlorhexidine scrub.  Under sterile conditions and local anesthesia, right internal jugular micropuncture venous access was performed. Access was performed with ultrasound. Images were obtained for documentation. A guide wire was inserted followed by a transitional dilator. This allowed insertion of a guide wire and catheter into the IVC. Measurements were obtained from the SVC / RA junction back to the right IJ venotomy site. In the right infraclavicular chest, a subcutaneous pocket was created over the second anterior rib. This was done under sterile conditions and local anesthesia. 1% lidocaine with epinephrine was utilized for this. A 2.5 cm incision was made in the skin. Blunt dissection was performed to create a subcutaneous pocket over the right pectoralis major muscle. The pocket was flushed with saline vigorously. There was adequate hemostasis. The port catheter was assembled and checked for leakage. The port catheter was secured in the pocket with two retention sutures. The tubing was tunneled subcutaneously to the right venotomy site and inserted into the SVC/RA junction through a valved peel-away sheath. Position was confirmed with fluoroscopy. Images were obtained for documentation. The patient  tolerated the procedure well. No immediate complications. Incisions were closed in a two layer fashion with 4 - 0 Vicryl suture. Dermabond was applied to the skin. The port catheter was accessed, blood was aspirated followed by saline and heparin flushes. Needle was removed. A dry sterile dressing was applied.  IMPRESSION: Ultrasound and fluoroscopically guided right internal jugular single lumen power port catheter insertion. Tip in the SVC/RA junction. Catheter ready for use.   Electronically Signed   By: Daryll Brod M.D.   On: 08/16/2014 15:30   Ir US Guide Vasc Access Right  08/16/2014   CLINICAL DATA:  Endometrial cancer, access for chemotherapy  EXAM: RIGHT INTERNAL JUGULAR SINGLE LUMEN POWER PORT CATHETER INSERTION  Date:  1/25/20161/25/2016 2:12 pm  Radiologist:  M. Daryll Brod, MD  Guidance:  ULTRASOUND AND FLUOROSCOPIC  FLUOROSCOPY TIME:  30 seconds  MEDICATIONS AND MEDICAL HISTORY: 1.5 g of vancomycinadministered within 1 hour of the procedure.3.5 mg versed, 100 mcg fentanyl  ANESTHESIA/SEDATION: 32 min  CONTRAST:  None.  COMPLICATIONS: None  PROCEDURE: Informed consent was obtained from the patient following explanation of the procedure, risks, benefits and alternatives. The patient understands, agrees and consents for the procedure. All questions were addressed. A time out was performed.  Maximal barrier sterile technique utilized including caps, mask, sterile gowns, sterile gloves, large sterile drape, hand hygiene, and 2% chlorhexidine scrub.  Under sterile conditions and local anesthesia, right internal jugular micropuncture venous access was performed. Access was performed with ultrasound. Images were obtained for documentation. A guide wire was inserted followed by a transitional dilator. This allowed insertion of a guide wire and catheter into the IVC. Measurements were obtained from the SVC / RA junction back to the right IJ venotomy site. In the right infraclavicular chest, a subcutaneous  pocket was created over the second anterior rib. This was done under sterile conditions and local anesthesia. 1% lidocaine with epinephrine was utilized for this. A 2.5 cm incision was made in the skin. Blunt dissection was performed to create a subcutaneous pocket over the right pectoralis major muscle. The pocket was flushed with saline vigorously. There was adequate hemostasis. The port catheter was assembled and checked for leakage. The port catheter was secured in the pocket with  two retention sutures. The tubing was tunneled subcutaneously to the right venotomy site and inserted into the SVC/RA junction through a valved peel-away sheath. Position was confirmed with fluoroscopy. Images were obtained for documentation. The patient tolerated the procedure well. No immediate complications. Incisions were closed in a two layer fashion with 4 - 0 Vicryl suture. Dermabond was applied to the skin. The port catheter was accessed, blood was aspirated followed by saline and heparin flushes. Needle was removed. A dry sterile dressing was applied.  IMPRESSION: Ultrasound and fluoroscopically guided right internal jugular single lumen power port catheter insertion. Tip in the SVC/RA junction. Catheter ready for use.   Electronically Signed   By: Daryll Brod M.D.   On: 08/16/2014 15:30      IMPRESSION/PLAN: Today, we discussed the patient's diagnosis ofGrade 3 Mixed serous endometroid endometrial carcinoma FIGO Stage IA (T1a, N0, M0) and general treatment for this, highlighting the role of adjuvant vaginal cuff brachytherapy in the management. We discussed the available radiation techniques, and focused on the details of logistics and delivery.   Katelyn Fisher is proceeding with chemotherapy.  Vaginal cuff brachytherapy is recommended by me to take place thereafter, to prevent a local recurrence.  We discussed the risks, benefits, and side effects of radiotherapy (HDR brachytherapy to vaginal cuff, 30 Gy/5 fractions  to mucosal surface.) Side effects may include but not necessarily be limited to: fatigue, vaginal dryness/stenosis, rare internal organ injury. No guarantees of treatment were given. A consent form was signed and placed in the patient's medical record.  The patient was encouraged to ask questions that I answered to the best of my ability. She is enthusiastic to proceed.  I plan to proceed with treatment planning once she is released to me by Dr Marko Plume.  __________________________________________   Eppie Gibson, MD

## 2014-09-04 NOTE — ED Notes (Addendum)
C/o leg pain this last week, worse tonight after taking her cipro, pain eased off tonight after taking her tramadol. Pt had contact with her oncologist yesterday. Notes in system reveal that today's appt was cancelled because blood work is improving. (denies: sx other than pain, denies: fever, nvd, dizziness, cough, congestion, cold sx, urinary sx, abd pain, back pain, chills or other sx).

## 2014-09-04 NOTE — ED Notes (Signed)
Dr. Randal Buba in to see pt.

## 2014-09-05 ENCOUNTER — Other Ambulatory Visit: Payer: Self-pay | Admitting: Oncology

## 2014-09-05 DIAGNOSIS — C541 Malignant neoplasm of endometrium: Secondary | ICD-10-CM

## 2014-09-06 ENCOUNTER — Other Ambulatory Visit (HOSPITAL_BASED_OUTPATIENT_CLINIC_OR_DEPARTMENT_OTHER): Payer: Commercial Managed Care - HMO

## 2014-09-06 ENCOUNTER — Ambulatory Visit (HOSPITAL_BASED_OUTPATIENT_CLINIC_OR_DEPARTMENT_OTHER): Payer: Commercial Managed Care - HMO | Admitting: Oncology

## 2014-09-06 ENCOUNTER — Encounter: Payer: Self-pay | Admitting: Oncology

## 2014-09-06 ENCOUNTER — Telehealth: Payer: Self-pay | Admitting: Oncology

## 2014-09-06 VITALS — BP 138/92 | HR 92 | Temp 98.8°F | Resp 20 | Ht 62.0 in | Wt 243.6 lb

## 2014-09-06 DIAGNOSIS — R112 Nausea with vomiting, unspecified: Secondary | ICD-10-CM

## 2014-09-06 DIAGNOSIS — I951 Orthostatic hypotension: Secondary | ICD-10-CM

## 2014-09-06 DIAGNOSIS — I10 Essential (primary) hypertension: Secondary | ICD-10-CM

## 2014-09-06 DIAGNOSIS — T451X5A Adverse effect of antineoplastic and immunosuppressive drugs, initial encounter: Secondary | ICD-10-CM

## 2014-09-06 DIAGNOSIS — C541 Malignant neoplasm of endometrium: Secondary | ICD-10-CM

## 2014-09-06 DIAGNOSIS — D702 Other drug-induced agranulocytosis: Secondary | ICD-10-CM

## 2014-09-06 LAB — CBC WITH DIFFERENTIAL/PLATELET
BASO%: 0.8 % (ref 0.0–2.0)
BASOS ABS: 0.1 10*3/uL (ref 0.0–0.1)
EOS%: 2.3 % (ref 0.0–7.0)
Eosinophils Absolute: 0.3 10*3/uL (ref 0.0–0.5)
HEMATOCRIT: 37.9 % (ref 34.8–46.6)
HEMOGLOBIN: 11.1 g/dL — AB (ref 11.6–15.9)
LYMPH%: 17.4 % (ref 14.0–49.7)
MCH: 19.8 pg — AB (ref 25.1–34.0)
MCHC: 29.4 g/dL — AB (ref 31.5–36.0)
MCV: 67.6 fL — ABNORMAL LOW (ref 79.5–101.0)
MONO#: 1.4 10*3/uL — ABNORMAL HIGH (ref 0.1–0.9)
MONO%: 9.3 % (ref 0.0–14.0)
NEUT#: 10.9 10*3/uL — ABNORMAL HIGH (ref 1.5–6.5)
NEUT%: 70.2 % (ref 38.4–76.8)
PLATELETS: 186 10*3/uL (ref 145–400)
RBC: 5.61 10*6/uL — AB (ref 3.70–5.45)
RDW: 15.3 % — AB (ref 11.2–14.5)
WBC: 15.5 10*3/uL — ABNORMAL HIGH (ref 3.9–10.3)
lymph#: 2.7 10*3/uL (ref 0.9–3.3)

## 2014-09-06 LAB — COMPREHENSIVE METABOLIC PANEL (CC13)
ALT: 26 U/L (ref 0–55)
AST: 19 U/L (ref 5–34)
Albumin: 3.3 g/dL — ABNORMAL LOW (ref 3.5–5.0)
Alkaline Phosphatase: 97 U/L (ref 40–150)
Anion Gap: 10 mEq/L (ref 3–11)
BUN: 7.9 mg/dL (ref 7.0–26.0)
CALCIUM: 8.9 mg/dL (ref 8.4–10.4)
CO2: 25 meq/L (ref 22–29)
Chloride: 105 mEq/L (ref 98–109)
Creatinine: 0.8 mg/dL (ref 0.6–1.1)
EGFR: >90 mL/min/{1.73_m2} (ref >90)
Glucose: 131 mg/dl (ref 70–140)
POTASSIUM: 3.8 meq/L (ref 3.5–5.1)
SODIUM: 141 meq/L (ref 136–145)
Total Bilirubin: 0.21 mg/dL (ref 0.20–1.20)
Total Protein: 6.7 g/dL (ref 6.4–8.3)

## 2014-09-06 NOTE — Telephone Encounter (Signed)
gave pt avs repoert and appts for feb/march.

## 2014-09-06 NOTE — Patient Instructions (Addendum)
After next chemo, call if nausea/ vomiting not controlled with nausea medications, or if you cannot drink fluids well. We can give you IV fluids at office if so.  Resume lasix today and take this daily as long as blood pressure is >618 systolic and you are able to take fluids well.  Watch blood pressures closely for several days after chemo. Do not take BP meds or lasix if systolic BP < 485-927.  Tramadol is fine for aches from chemo or from shots. Suggest also starting claritin the day of chemo or the next morning, and taking it daily from then until a couple of days after shots finish.

## 2014-09-06 NOTE — Progress Notes (Signed)
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 IVF were helpful on day 7, with lasix held and other BP meds needed to resume based on home BP by the following day. She was neutropenic with ANC 0.5 by day 10, with granix given day 10 and day 11, and also covered then with cipro. She was seen in ED on day 12 with leg pain which was consistent with gCSF; cipro was also DCd, no CBC done in ED, was given tramadol. She saw Dr Isidore Moos in consultation on 09-03-14, plan for vaginal brachytherapy after chemo completes.  Leg pain has essentially resolved, and no longer neutropenic by labs today. BPs have been higher in past week off lasix, 138/92 up to 166/102 at home. She has taken norvasc and lotensin as instructed, and will resume lasix now; she understands that she will need to watch BP at home after chemo treatments, and may need to adjust again during those times. Ear and sinus symptoms resolved with claritin. She is eating and drinking fluids well now, tho taste is affected by chemo. Bowels are moving.  PAC in Flu vaccine done  ONCOLOGIC HISTORY Patient presented to Dr Dellis Filbert with new onset postmenopausal spotting. [place] 05-07-14 reportedly had thickened endometrial stripe, with 3 fibroids largest 5x 5x 3.5 cm; endometrial biopsy 05-07-14 reportedly was concerning for at least FIGO grade 1 endometrial carcinoma. She had consultation with Dr Denman George on 05-20-14. Surgery was robotic hysterectomy, BSO, bilateral pelvic and para-aortic nodes by Dr Skeet Latch at [facility] on 06-23-14. Patient did well with surgery, discharged home on POD #1. Pathology [phone]) found mixed high  grade carcinoma (serous 50% and endometrioid 50%) FIGO grade 3, involving inner half of myometrium with depth 5 mm where wall 3 cm thick, no serosal or lower uterine segment involvement, no cervical or adnexal involvement, LVSI present, 0/15 nodes involved. PET/ CT at [facility] 07-30-14 had no findings of concern for distant disease. She was presented at [facility] multidisciplinary conference, with recommendations from that conference and from Dr Skeet Latch for 6 cycles of taxane platinum chemotherapy followed by vaginal brachytherapy, and for genetics counseling. Patient and husband attended chemotherapy teaching class prior to visit today. She has not had radiation oncology consultation as yet. She had follow up with Dr Skeet Latch on 08-06-14 at [facility], and will see her next in [place] in ~ 3 months. Cycle 1 carboplatin taxol was given 2-08-24-14   Review of systems as above, also: No HA or chest pain. Denies increased SOB. Wakens every 2 hrs at night, chronic problem. No LE swelling.Remainder of 10 point Review of Systems negative.  Objective:  Vital signs in last 24 hours:  BP 138/92 mmHg  Pulse 92  Temp(Src) 98.8 F (37.1 C) (Oral)  Resp 20  Ht _0  (1.575 m)  Wt 243 lb 9.6 oz (110.496 kg)  BMI 44.54 kg/m2 weight is up 4 lbs. Looks much better today, smiling and easily mobile. Alert, oriented and appropriate. Ambulatory without difficulty.  No alopecia  HEENT:PERRL, sclerae not icteric. Oral mucosa moist without lesions, posterior pharynx clear.  Neck supple. No JVD.  Lymphatics:no cervical,suraclavicular adenopathy Resp: clear to auscultation bilaterally and normal percussion bilaterally Cardio: regular rate and rhythm. No gallop. SP:QZRAQTM obese,  soft, nontender, not distended, no mass or organomegaly. Normally active bowel sounds. Surgical incisions not remarkable. Musculoskeletal/ Extremities: without pitting edema, cords, tenderness Neuro: no significant peripheral neuropathy. Otherwise  nonfocal. PSYCH appropriate mood and affect Skin without rash, ecchymosis, petechiae. Portacath-without erythema or tenderness  Lab Results:  Results for orders placed or performed in visit on 09/06/14  CBC with Differential  Result Value Ref Range   WBC 15.5 (H) 3.9 - 10.3 10e3/uL   NEUT# 10.9 (H) 1.5 - 6.5 10e3/uL   HGB 11.1 (L) 11.6 - 15.9 g/dL   HCT 37.9 34.8 - 46.6 %   Platelets 186 145 - 400 10e3/uL   MCV 67.6 (L) 79.5 - 101.0 fL   MCH 19.8 (L) 25.1 - 34.0 pg   MCHC 29.4 (L) 31.5 - 36.0 g/dL   RBC 5.61 (H) 3.70 - 5.45 10e6/uL   RDW 15.3 (H) 11.2 - 14.5 %   lymph# 2.7 0.9 - 3.3 10e3/uL   MONO# 1.4 (H) 0.1 - 0.9 10e3/uL   Eosinophils Absolute 0.3 0.0 - 0.5 10e3/uL   Basophils Absolute 0.1 0.0 - 0.1 10e3/uL   NEUT% 70.2 38.4 - 76.8 %   LYMPH% 17.4 14.0 - 49.7 %   MONO% 9.3 0.0 - 14.0 %   EOS% 2.3 0.0 - 7.0 %   BASO% 0.8 0.0 - 2.0 %  Comprehensive metabolic panel (Cmet) - [facility]  Result Value Ref Range   Sodium 141 136 - 145 mEq/L   Potassium 3.8 3.5 - 5.1 mEq/L   Chloride 105 98 - 109 mEq/L   CO2 25 22 - 29 mEq/L   Glucose 131 70 - 140 mg/dl   BUN 7.9 7.0 - 26.0 mg/dL   Creatinine 0.8 0.6 - 1.1 mg/dL   Total Bilirubin 0.21 0.20 - 1.20 mg/dL   Alkaline Phosphatase 97 40 - 150 U/L   AST 19 5 - 34 U/L   ALT 26 0 - 55 U/L   Total Protein 6.7 6.4 - 8.3 g/dL   Albumin 3.3 (L) 3.5 - 5.0 g/dL   Calcium 8.9 8.4 - 10.4 mg/dL   Anion Gap 10 3 - 11 mEq/L   EGFR >90 >90 ml/min/1.73 m2     Studies/Results:  No results found.  Medications: I have reviewed the patient's current medications. Resume lasix. Fine to use tramadol prn for aches after taxol and gCSF.  DISCUSSION: gCSF aches discussed again. Antihypertensives as above.   Patient asking if any help with housekeeping available, message sent to SW.  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 Cycle 1 complicated to date by taxol aches,  hypotension/ dehydration day 7, neutropenia day 10, gCSF aches. Counts recovered, otherwise all improved. Will give cycle 2 on 2-23 as long as ANC >=1.5 and plt >=100k. RN will assess by phone a few days after treatment/ patient and husband also to let us know if may need extra IVF. Will add EMEND if approved due to delayed nausea. I will see her back with labs and expect to begin gCSF 09-20-14. 2.hypotension/ orthostatic symptoms resolved with IVF after cycle 1: resume daily lasix with BP higher now, as well as usual antihypertensives. Follow home BP and adjust after next treatments if needed. She will keep scheduled appointment with Dr Charlett Blake upcoming. 3.PAC in 4.aches from taxol and gCSF. Should begin claritin day of chemo and can use prn tramadol  if helpful 5.right TM dull, possibly allergic post nasal drainage: resolved with claritin  6.asthma intermittent, GERD, benign positional vertigo 7.past colon polyps, hx diverticulitis 8.flu vaccine done 9.post cholecystectomy   Patient and husband understood discussion and are in agreement with plans above. They know to call prior to next scheduled appointments if needed. Chemo orders adjusted. Message to financial staff re EMEND. Time spent 25 min including >50% counseling and coordination of care.   Artist Bloom P, MD   09/06/2014, 10:44 AM

## 2014-09-08 ENCOUNTER — Other Ambulatory Visit: Payer: Self-pay | Admitting: Oncology

## 2014-09-08 DIAGNOSIS — C541 Malignant neoplasm of endometrium: Secondary | ICD-10-CM

## 2014-09-09 ENCOUNTER — Encounter: Payer: Self-pay | Admitting: *Deleted

## 2014-09-09 NOTE — Progress Notes (Signed)
[place] Psychosocial Distress Screening Clinical Social Work  Clinical Social Work was referred by distress screening protocol.  The patient scored a 5 on the Psychosocial Distress Thermometer which indicates moderate distress. Clinical Social Worker attempted to contact patient to assess for distress identified in screen. CSW had contacted patient by phone ten minutes prior to this attempt for a different referral made by medical oncologist for cleaning services.   ONCBCN DISTRESS SCREENING 09/03/2014  Screening Type Change in Status  Distress experienced in past week (1-10) 5  Practical problem type Food  Family Problem type   Emotional problem type Adjusting to illness  Spiritual/Religous concerns type   Information Concerns Type   Physical Problem type Sleep/insomnia  Physician notified of physical symptoms   Referral to clinical social work   Other "sleep" listed as most distressing    Clinical Social Worker follow up needed: Yes.    If yes, follow up plan:  CSW left voicemail for patient to return call when convenient.  Patient is familiar with CSW from previous phone contact made today.  Polo Riley, MSW, LCSW, OSW-C Clinical Social Worker [facility] [phone]

## 2014-09-09 NOTE — Progress Notes (Signed)
[place] Work  Clinical Social Work was referred by Futures trader.  Clinical Social Worker contacted patient by phone to offer support and assess for needs.  Katelyn Fisher shared she is currently in treatment and lives with her spouse.  She stated her spouse is 'doing the best he can' but she is requesting help with general cleaning services at home.  CSW made referral for [employer] organization.  Katelyn Fisher plans to contact CSW once she has been contacted by the organization.   Polo Riley, MSW, LCSW, OSW-C Clinical Social Worker [facility] [phone]

## 2014-09-13 ENCOUNTER — Telehealth: Payer: Self-pay

## 2014-09-13 NOTE — Telephone Encounter (Signed)
Told Ms. Jupin that she needs to take the decadron at 1230 09-14-14 and at 0630 with some food for her treatment at ~1230 09-14-14.  Ms. Dolinger verbalized understanding.

## 2014-09-14 ENCOUNTER — Ambulatory Visit (HOSPITAL_BASED_OUTPATIENT_CLINIC_OR_DEPARTMENT_OTHER): Payer: Commercial Managed Care - HMO

## 2014-09-14 ENCOUNTER — Other Ambulatory Visit (HOSPITAL_BASED_OUTPATIENT_CLINIC_OR_DEPARTMENT_OTHER): Payer: Commercial Managed Care - HMO

## 2014-09-14 DIAGNOSIS — Z5111 Encounter for antineoplastic chemotherapy: Secondary | ICD-10-CM

## 2014-09-14 DIAGNOSIS — C541 Malignant neoplasm of endometrium: Secondary | ICD-10-CM

## 2014-09-14 LAB — CBC WITH DIFFERENTIAL/PLATELET
BASO%: 0 % (ref 0.0–2.0)
Basophils Absolute: 0 10*3/uL (ref 0.0–0.1)
EOS ABS: 0 10*3/uL (ref 0.0–0.5)
EOS%: 0 % (ref 0.0–7.0)
HCT: 39 % (ref 34.8–46.6)
HGB: 12.3 g/dL (ref 11.6–15.9)
LYMPH%: 16.7 % (ref 14.0–49.7)
MCH: 21.1 pg — ABNORMAL LOW (ref 25.1–34.0)
MCHC: 31.5 g/dL (ref 31.5–36.0)
MCV: 66.9 fL — AB (ref 79.5–101.0)
MONO#: 0 10*3/uL — AB (ref 0.1–0.9)
MONO%: 0.4 % (ref 0.0–14.0)
NEUT%: 82.9 % — AB (ref 38.4–76.8)
NEUTROS ABS: 4.7 10*3/uL (ref 1.5–6.5)
Platelets: 217 10*3/uL (ref 145–400)
RBC: 5.83 10*6/uL — AB (ref 3.70–5.45)
RDW: 15.7 % — ABNORMAL HIGH (ref 11.2–14.5)
WBC: 5.7 10*3/uL (ref 3.9–10.3)
lymph#: 1 10*3/uL (ref 0.9–3.3)
nRBC: 0 % (ref 0–0)

## 2014-09-14 LAB — COMPREHENSIVE METABOLIC PANEL (CC13)
ALBUMIN: 3.6 g/dL (ref 3.5–5.0)
ALT: 28 U/L (ref 0–55)
AST: 20 U/L (ref 5–34)
Alkaline Phosphatase: 94 U/L (ref 40–150)
Anion Gap: 12 mEq/L — ABNORMAL HIGH (ref 3–11)
BUN: 10.1 mg/dL (ref 7.0–26.0)
CALCIUM: 9.5 mg/dL (ref 8.4–10.4)
CO2: 21 mEq/L — ABNORMAL LOW (ref 22–29)
Chloride: 105 mEq/L (ref 98–109)
Creatinine: 0.9 mg/dL (ref 0.6–1.1)
EGFR: 81 mL/min/{1.73_m2} — ABNORMAL LOW (ref >90)
Glucose: 209 mg/dl — ABNORMAL HIGH (ref 70–140)
POTASSIUM: 4.2 meq/L (ref 3.5–5.1)
Sodium: 138 mEq/L (ref 136–145)
Total Bilirubin: 0.26 mg/dL (ref 0.20–1.20)
Total Protein: 7.5 g/dL (ref 6.4–8.3)

## 2014-09-14 MED ORDER — DIPHENHYDRAMINE HCL 50 MG/ML IJ SOLN
50.0000 mg | Freq: Once | INTRAMUSCULAR | Status: AC
  Administered 2014-09-14: 50 mg via INTRAVENOUS

## 2014-09-14 MED ORDER — SODIUM CHLORIDE 0.9 % IV SOLN
691.0000 mg | Freq: Once | INTRAVENOUS | Status: AC
  Administered 2014-09-14: 690 mg via INTRAVENOUS
  Filled 2014-09-14: qty 69

## 2014-09-14 MED ORDER — SODIUM CHLORIDE 0.9 % IV SOLN
150.0000 mg | Freq: Once | INTRAVENOUS | Status: AC
  Administered 2014-09-14: 150 mg via INTRAVENOUS
  Filled 2014-09-14: qty 5

## 2014-09-14 MED ORDER — DIPHENHYDRAMINE HCL 50 MG/ML IJ SOLN
INTRAMUSCULAR | Status: AC
  Filled 2014-09-14: qty 1

## 2014-09-14 MED ORDER — SODIUM CHLORIDE 0.9 % IV SOLN
Freq: Once | INTRAVENOUS | Status: AC
  Administered 2014-09-14: 13:00:00 via INTRAVENOUS

## 2014-09-14 MED ORDER — ONDANSETRON 16 MG/50ML IVPB (CHCC)
16.0000 mg | Freq: Once | INTRAVENOUS | Status: AC
  Administered 2014-09-14: 16 mg via INTRAVENOUS

## 2014-09-14 MED ORDER — FAMOTIDINE IN NACL 20-0.9 MG/50ML-% IV SOLN
INTRAVENOUS | Status: AC
  Filled 2014-09-14: qty 50

## 2014-09-14 MED ORDER — ONDANSETRON 16 MG/50ML IVPB (CHCC)
INTRAVENOUS | Status: AC
  Filled 2014-09-14: qty 16

## 2014-09-14 MED ORDER — FAMOTIDINE IN NACL 20-0.9 MG/50ML-% IV SOLN
20.0000 mg | Freq: Once | INTRAVENOUS | Status: AC
  Administered 2014-09-14: 20 mg via INTRAVENOUS

## 2014-09-14 MED ORDER — DEXAMETHASONE SODIUM PHOSPHATE 20 MG/5ML IJ SOLN
20.0000 mg | Freq: Once | INTRAMUSCULAR | Status: AC
  Administered 2014-09-14: 20 mg via INTRAVENOUS

## 2014-09-14 MED ORDER — SODIUM CHLORIDE 0.9 % IJ SOLN
10.0000 mL | INTRAMUSCULAR | Status: DC | PRN
  Administered 2014-09-14: 10 mL
  Filled 2014-09-14: qty 10

## 2014-09-14 MED ORDER — DEXAMETHASONE SODIUM PHOSPHATE 20 MG/5ML IJ SOLN
INTRAMUSCULAR | Status: AC
  Filled 2014-09-14: qty 5

## 2014-09-14 MED ORDER — HEPARIN SOD (PORK) LOCK FLUSH 100 UNIT/ML IV SOLN
500.0000 [IU] | Freq: Once | INTRAVENOUS | Status: AC | PRN
  Administered 2014-09-14: 500 [IU]
  Filled 2014-09-14: qty 5

## 2014-09-14 MED ORDER — PACLITAXEL CHEMO INJECTION 300 MG/50ML
175.0000 mg/m2 | Freq: Once | INTRAVENOUS | Status: AC
  Administered 2014-09-14: 384 mg via INTRAVENOUS
  Filled 2014-09-14: qty 64

## 2014-09-14 NOTE — Patient Instructions (Signed)
[facility] Discharge Instructions for Patients Receiving Chemotherapy  Today you received the following chemotherapy agents: taxol, carboplatin   To help prevent nausea and vomiting after your treatment, we encourage you to take your nausea medication as prescribed.    If you develop nausea and vomiting that is not controlled by your nausea medication, call the clinic.   BELOW ARE SYMPTOMS THAT SHOULD BE REPORTED IMMEDIATELY:  *FEVER GREATER THAN 100.5 F  *CHILLS WITH OR WITHOUT FEVER  NAUSEA AND VOMITING THAT IS NOT CONTROLLED WITH YOUR NAUSEA MEDICATION  *UNUSUAL SHORTNESS OF BREATH  *UNUSUAL BRUISING OR BLEEDING  TENDERNESS IN MOUTH AND THROAT WITH OR WITHOUT PRESENCE OF ULCERS  *URINARY PROBLEMS  *BOWEL PROBLEMS  UNUSUAL RASH Items with * indicate a potential emergency and should be followed up as soon as possible.  Feel free to call the clinic you have any questions or concerns. The clinic phone number is [phone].    

## 2014-09-16 ENCOUNTER — Telehealth: Payer: Self-pay | Admitting: *Deleted

## 2014-09-16 NOTE — Telephone Encounter (Signed)
Per Dr. Marko Plume, patient called to see how she is doing after chemo on 2/23. Patient states she has been taking Claritin daily and is not experiencing any muscle aches or pains. She denies any nausea as well. Patient states she has been drinking at least 64 ounces of water and lemonade each day. Encouraged patient to continue drinking PO fluids. Confirmed patient's appts on Monday, 09/20/14 while on the phone. Told patient to please call us before then if she has any concerns - patient agreeable to this.

## 2014-09-19 ENCOUNTER — Other Ambulatory Visit: Payer: Self-pay | Admitting: Oncology

## 2014-09-19 DIAGNOSIS — C541 Malignant neoplasm of endometrium: Secondary | ICD-10-CM

## 2014-09-20 ENCOUNTER — Encounter: Payer: Self-pay | Admitting: Oncology

## 2014-09-20 ENCOUNTER — Ambulatory Visit (HOSPITAL_BASED_OUTPATIENT_CLINIC_OR_DEPARTMENT_OTHER): Payer: Commercial Managed Care - HMO

## 2014-09-20 ENCOUNTER — Other Ambulatory Visit (HOSPITAL_BASED_OUTPATIENT_CLINIC_OR_DEPARTMENT_OTHER): Payer: Commercial Managed Care - HMO

## 2014-09-20 ENCOUNTER — Telehealth: Payer: Self-pay

## 2014-09-20 ENCOUNTER — Ambulatory Visit (HOSPITAL_BASED_OUTPATIENT_CLINIC_OR_DEPARTMENT_OTHER): Payer: Commercial Managed Care - HMO | Admitting: Oncology

## 2014-09-20 VITALS — BP 129/91 | HR 100 | Temp 98.5°F | Resp 18 | Ht 62.0 in | Wt 238.6 lb

## 2014-09-20 DIAGNOSIS — Z5189 Encounter for other specified aftercare: Secondary | ICD-10-CM

## 2014-09-20 DIAGNOSIS — T451X5A Adverse effect of antineoplastic and immunosuppressive drugs, initial encounter: Secondary | ICD-10-CM

## 2014-09-20 DIAGNOSIS — M79604 Pain in right leg: Secondary | ICD-10-CM

## 2014-09-20 DIAGNOSIS — G620 Drug-induced polyneuropathy: Secondary | ICD-10-CM

## 2014-09-20 DIAGNOSIS — M545 Low back pain: Secondary | ICD-10-CM

## 2014-09-20 DIAGNOSIS — C541 Malignant neoplasm of endometrium: Secondary | ICD-10-CM

## 2014-09-20 DIAGNOSIS — I10 Essential (primary) hypertension: Secondary | ICD-10-CM

## 2014-09-20 LAB — CBC WITH DIFFERENTIAL/PLATELET
BASO%: 0.3 % (ref 0.0–2.0)
BASOS ABS: 0 10*3/uL (ref 0.0–0.1)
EOS%: 12.2 % — AB (ref 0.0–7.0)
Eosinophils Absolute: 0.5 10*3/uL (ref 0.0–0.5)
HEMATOCRIT: 38.6 % (ref 34.8–46.6)
HEMOGLOBIN: 11.5 g/dL — AB (ref 11.6–15.9)
LYMPH%: 47.3 % (ref 14.0–49.7)
MCH: 20.2 pg — AB (ref 25.1–34.0)
MCHC: 29.9 g/dL — AB (ref 31.5–36.0)
MCV: 67.4 fL — AB (ref 79.5–101.0)
MONO#: 0 10*3/uL — ABNORMAL LOW (ref 0.1–0.9)
MONO%: 1.1 % (ref 0.0–14.0)
NEUT%: 39.1 % (ref 38.4–76.8)
NEUTROS ABS: 1.5 10*3/uL (ref 1.5–6.5)
Platelets: 207 10*3/uL (ref 145–400)
RBC: 5.72 10*6/uL — ABNORMAL HIGH (ref 3.70–5.45)
RDW: 15.3 % — ABNORMAL HIGH (ref 11.2–14.5)
WBC: 3.8 10*3/uL — AB (ref 3.9–10.3)
lymph#: 1.8 10*3/uL (ref 0.9–3.3)

## 2014-09-20 LAB — COMPREHENSIVE METABOLIC PANEL (CC13)
ALK PHOS: 73 U/L (ref 40–150)
ALT: 33 U/L (ref 0–55)
AST: 20 U/L (ref 5–34)
Albumin: 3.6 g/dL (ref 3.5–5.0)
Anion Gap: 6 mEq/L (ref 3–11)
BUN: 13.5 mg/dL (ref 7.0–26.0)
CO2: 29 mEq/L (ref 22–29)
CREATININE: 0.8 mg/dL (ref 0.6–1.1)
Calcium: 9.6 mg/dL (ref 8.4–10.4)
Chloride: 104 mEq/L (ref 98–109)
EGFR: 90 mL/min/{1.73_m2} — ABNORMAL LOW (ref >90)
Glucose: 98 mg/dl (ref 70–140)
Potassium: 4.1 mEq/L (ref 3.5–5.1)
Sodium: 138 mEq/L (ref 136–145)
Total Bilirubin: 0.43 mg/dL (ref 0.20–1.20)
Total Protein: 7 g/dL (ref 6.4–8.3)

## 2014-09-20 MED ORDER — TBO-FILGRASTIM 300 MCG/0.5ML ~~LOC~~ SOSY
300.0000 ug | PREFILLED_SYRINGE | Freq: Once | SUBCUTANEOUS | Status: AC
  Administered 2014-09-20: 300 ug via SUBCUTANEOUS
  Filled 2014-09-20: qty 0.5

## 2014-09-20 NOTE — Patient Instructions (Signed)
Ok to increase tramadol to two of the 50 mg tablets every 8 hrs if this dose helps leg pain better  Call if you are very tired or if fever (100.5 or higher) or if symptoms of infection prior to next appointment

## 2014-09-20 NOTE — Progress Notes (Signed)
OFFICE PROGRESS NOTE   September 20, 2014   Physicians:Emma Rossi/ Janie Morning, Mosie Lukes, MD,Marie-Lynn Lequita Halt, J.Bardelas, Jari Pigg, Verl Blalock  INTERVAL HISTORY:  Patient is seen, together with husband, in continuing attention to adjuvant chemotherapy in process for IA grade 3 serous/endometrioid endometrial carcinoma, having had cycle 2 carboplatin taxol on 09-14-14. As she was neutropenic by day 10 cycle 1, she is to begin 3 days of granix today. She did better with this second cycle of chemotherapy thus far, with EMEND added to IV premeds. Plan is for vaginal brachytherapy after chemo completes.  Patient had no nausea, has been eating and drinking well and bowels are moving regularly. Tramadol and tylenol were helpful for taxol aches, which do not seem to have been severe. She has slight tingling on soles of feet bilaterally, none in hands Unrelated to taxol aches, she has had pain lateral right leg for past 3 days, this problem same as she has had in past and which was treated with injection (to knee?) by Dr Nelva Bush. Last orthopedic injection was ~ Nov 2015. Patient would be feeling well now if not for this RLE pain. Blood pressures at home last 4 days 111-129/ 83-97  PAC in Flu vaccine done.   ONCOLOGIC HISTORY Patient presented to Dr Dellis Filbert with new onset postmenopausal spotting. [place] 05-07-14 reportedly had thickened endometrial stripe, with 3 fibroids largest 5x 5x 3.5 cm; endometrial biopsy 05-07-14 reportedly was concerning for at least FIGO grade 1 endometrial carcinoma. She had consultation with Dr Denman George on 05-20-14. Surgery was robotic hysterectomy, BSO, bilateral pelvic and para-aortic nodes by Dr Skeet Latch at [facility] on 06-23-14. Patient did well with surgery, discharged home on POD #1. Pathology [phone]) found mixed high grade carcinoma (serous 50% and endometrioid 50%) FIGO grade 3, involving inner half of myometrium with depth 5 mm where wall 3 cm  thick, no serosal or lower uterine segment involvement, no cervical or adnexal involvement, LVSI present, 0/15 nodes involved. PET/ CT at [facility] 07-30-14 had no findings of concern for distant disease. She was presented at [facility] multidisciplinary conference, with recommendations from that conference and from Dr Skeet Latch for 6 cycles of taxane platinum chemotherapy followed by vaginal brachytherapy, and for genetics counseling. Patient and husband attended chemotherapy teaching class prior to visit today. She has not had radiation oncology consultation as yet. She had follow up with Dr Skeet Latch on 08-06-14 at [facility], and will see her next in [place] in ~ 3 months. Cycle 1 carboplatin taxol was given 08-24-2014    Review of systems as above, also: No fever or symptoms of infection. No neuropathy in hands. No syncope. No low back pain. No LE swelling. No rash. Remainder of 10 point Review of Systems negative.  Objective:  Vital signs in last 24 hours:  BP 129/91 mmHg  Pulse 100  Temp(Src) 98.5 F (36.9 C) (Oral)  Resp 18  Ht 5' 2" (1.575 m)  Wt 238 lb 9.6 oz (108.228 kg)  BMI 43.63 kg/m2 Weight down 5 lbs. Alert, oriented and appropriate. Ambulatory without assistance, favors RLE.. Otherwise looks comfortable, respirations not labored RA Alopecia  HEENT:PERRL, sclerae not icteric. Oral mucosa moist without lesions, posterior pharynx clear.  Neck supple. No JVD.  Lymphatics:no cervical,supraclavicular adenopathy Resp: clear to auscultation bilaterally and normal percussion bilaterally Cardio: regular rate and rhythm. No gallop. GI: abdomen obese, soft, nontender, not distended, no appreciable mass or organomegaly. Normally active bowel sounds. Surgical incisions not remarkable. Musculoskeletal/ Extremities: without pitting edema, cords,  tenderness Neuro: no significant peripheral neuropathy. Otherwise nonfocal. PSYCH appropriate mood and affect Skin without rash, ecchymosis,  petechiae Portacath-without erythema or tenderness  Lab Results:  Results for orders placed or performed in visit on 09/20/14  CBC with Differential  Result Value Ref Range   WBC 3.8 (L) 3.9 - 10.3 10e3/uL   NEUT# 1.5 1.5 - 6.5 10e3/uL   HGB 11.5 (L) 11.6 - 15.9 g/dL   HCT 38.6 34.8 - 46.6 %   Platelets 207 145 - 400 10e3/uL   MCV 67.4 (L) 79.5 - 101.0 fL   MCH 20.2 (L) 25.1 - 34.0 pg   MCHC 29.9 (L) 31.5 - 36.0 g/dL   RBC 5.72 (H) 3.70 - 5.45 10e6/uL   RDW 15.3 (H) 11.2 - 14.5 %   lymph# 1.8 0.9 - 3.3 10e3/uL   MONO# 0.0 (L) 0.1 - 0.9 10e3/uL   Eosinophils Absolute 0.5 0.0 - 0.5 10e3/uL   Basophils Absolute 0.0 0.0 - 0.1 10e3/uL   NEUT% 39.1 38.4 - 76.8 %   LYMPH% 47.3 14.0 - 49.7 %   MONO% 1.1 0.0 - 14.0 %   EOS% 12.2 (H) 0.0 - 7.0 %   BASO% 0.3 0.0 - 2.0 %  Comprehensive metabolic panel (Cmet) - [facility]  Result Value Ref Range   Sodium 138 136 - 145 mEq/L   Potassium 4.1 3.5 - 5.1 mEq/L   Chloride 104 98 - 109 mEq/L   CO2 29 22 - 29 mEq/L   Glucose 98 70 - 140 mg/dl   BUN 13.5 7.0 - 26.0 mg/dL   Creatinine 0.8 0.6 - 1.1 mg/dL   Total Bilirubin 0.43 0.20 - 1.20 mg/dL   Alkaline Phosphatase 73 40 - 150 U/L   AST 20 5 - 34 U/L   ALT 33 0 - 55 U/L   Total Protein 7.0 6.4 - 8.3 g/dL   Albumin 3.6 3.5 - 5.0 g/dL   Calcium 9.6 8.4 - 10.4 mg/dL   Anion Gap 6 3 - 11 mEq/L   EGFR 90 (L) >90 ml/min/1.73 m2     Studies/Results:  No results found.  Medications: I have reviewed the patient's current medications. Continue EMEND with IV premeds. She is on claritin. She will have granix 2-29, 3-1 and 3-2.  DISCUSSION: this MD spoke directly with Dr Ramos' office, with offer of appointment with APP there tomorrow; MD then notified care management directly as she needs new patient referral for the visit, last referral apparently recently expired. Letter completed for home cleaning assistance for cancer patients. She understands that counts may drop despite granix, and that she  should call if fever or symptoms of infection, or if very fatigued.  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  Has tolerated cycle 2 better so far, gCSF to begin now. I will see her as scheduled 09-27-14 and she will have cycle 3 on 10-05-14 if counts adequate.  2.hypotension/ orthostatic symptoms resolved with IVF after cycle 1: back on daily lasix with BP and usual antihypertensives. Follow home BP and adjust after next treatments if needed. She will keep scheduled appointment with Dr Blyth 10-26-14 3.PAC in 4.aches from taxol and gCSF. Less with  claritin beginning day of chemo and prn tramadol  5.right lateral LE pain: seems to be exacerbation of previous orthopedic   problem. 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 Discussed. [place]ow.  All questions answered. Patient and husband are in agreement with continuing treatment as planned. Time spent 25 min including >50% counseling and coordination of care.  LIVESAY,LENNIS P, MD   09/20/2014, 3:24 PM    

## 2014-09-20 NOTE — Telephone Encounter (Signed)
Faxed letter stating that Katelyn Fisher is under cancer treatment with Dr. Marko Plume.  This is needed for application for cleaning services.

## 2014-09-21 ENCOUNTER — Ambulatory Visit (HOSPITAL_BASED_OUTPATIENT_CLINIC_OR_DEPARTMENT_OTHER): Payer: Commercial Managed Care - HMO

## 2014-09-21 DIAGNOSIS — Z5189 Encounter for other specified aftercare: Secondary | ICD-10-CM

## 2014-09-21 DIAGNOSIS — C541 Malignant neoplasm of endometrium: Secondary | ICD-10-CM

## 2014-09-21 MED ORDER — TBO-FILGRASTIM 300 MCG/0.5ML ~~LOC~~ SOSY
300.0000 ug | PREFILLED_SYRINGE | Freq: Once | SUBCUTANEOUS | Status: AC
  Administered 2014-09-21: 300 ug via SUBCUTANEOUS
  Filled 2014-09-21: qty 0.5

## 2014-09-22 ENCOUNTER — Telehealth: Payer: Self-pay | Admitting: Oncology

## 2014-09-22 ENCOUNTER — Encounter: Payer: Self-pay | Admitting: Family Medicine

## 2014-09-22 ENCOUNTER — Ambulatory Visit (HOSPITAL_BASED_OUTPATIENT_CLINIC_OR_DEPARTMENT_OTHER): Payer: Commercial Managed Care - HMO

## 2014-09-22 ENCOUNTER — Other Ambulatory Visit: Payer: Self-pay | Admitting: Oncology

## 2014-09-22 ENCOUNTER — Ambulatory Visit: Payer: Commercial Managed Care - HMO

## 2014-09-22 DIAGNOSIS — G620 Drug-induced polyneuropathy: Secondary | ICD-10-CM | POA: Insufficient documentation

## 2014-09-22 DIAGNOSIS — C541 Malignant neoplasm of endometrium: Secondary | ICD-10-CM

## 2014-09-22 DIAGNOSIS — Z5189 Encounter for other specified aftercare: Secondary | ICD-10-CM

## 2014-09-22 DIAGNOSIS — T451X5A Adverse effect of antineoplastic and immunosuppressive drugs, initial encounter: Secondary | ICD-10-CM

## 2014-09-22 MED ORDER — TBO-FILGRASTIM 300 MCG/0.5ML ~~LOC~~ SOSY
300.0000 ug | PREFILLED_SYRINGE | Freq: Once | SUBCUTANEOUS | Status: AC
  Administered 2014-09-22: 300 ug via SUBCUTANEOUS
  Filled 2014-09-22: qty 0.5

## 2014-09-22 NOTE — Telephone Encounter (Signed)
per pof ot sch pt appt-sent MW email to sch pt trmt-LL stated she had made appt for pt-cld Dr Nelva Bush office they stated pt CX appt 3/1

## 2014-09-23 ENCOUNTER — Ambulatory Visit: Payer: Commercial Managed Care - HMO | Admitting: Family Medicine

## 2014-09-25 ENCOUNTER — Other Ambulatory Visit: Payer: Self-pay | Admitting: Oncology

## 2014-09-25 DIAGNOSIS — C541 Malignant neoplasm of endometrium: Secondary | ICD-10-CM

## 2014-09-27 ENCOUNTER — Encounter: Payer: Self-pay | Admitting: Oncology

## 2014-09-27 ENCOUNTER — Ambulatory Visit (HOSPITAL_BASED_OUTPATIENT_CLINIC_OR_DEPARTMENT_OTHER): Payer: Commercial Managed Care - HMO | Admitting: Oncology

## 2014-09-27 ENCOUNTER — Telehealth: Payer: Self-pay | Admitting: Oncology

## 2014-09-27 ENCOUNTER — Telehealth: Payer: Self-pay | Admitting: *Deleted

## 2014-09-27 ENCOUNTER — Other Ambulatory Visit (HOSPITAL_BASED_OUTPATIENT_CLINIC_OR_DEPARTMENT_OTHER): Payer: Commercial Managed Care - HMO

## 2014-09-27 VITALS — BP 135/84 | HR 106 | Temp 97.8°F | Resp 18 | Ht 62.0 in | Wt 240.3 lb

## 2014-09-27 DIAGNOSIS — C541 Malignant neoplasm of endometrium: Secondary | ICD-10-CM

## 2014-09-27 DIAGNOSIS — G622 Polyneuropathy due to other toxic agents: Secondary | ICD-10-CM

## 2014-09-27 DIAGNOSIS — M79604 Pain in right leg: Secondary | ICD-10-CM

## 2014-09-27 LAB — CBC WITH DIFFERENTIAL/PLATELET
BASO%: 0.4 % (ref 0.0–2.0)
Basophils Absolute: 0 10*3/uL (ref 0.0–0.1)
EOS%: 1.5 % (ref 0.0–7.0)
Eosinophils Absolute: 0.1 10*3/uL (ref 0.0–0.5)
HCT: 36.6 % (ref 34.8–46.6)
HGB: 11.3 g/dL — ABNORMAL LOW (ref 11.6–15.9)
LYMPH%: 28.5 % (ref 14.0–49.7)
MCH: 21.1 pg — ABNORMAL LOW (ref 25.1–34.0)
MCHC: 30.9 g/dL — ABNORMAL LOW (ref 31.5–36.0)
MCV: 68.3 fL — ABNORMAL LOW (ref 79.5–101.0)
MONO#: 0.7 10*3/uL (ref 0.1–0.9)
MONO%: 8.7 % (ref 0.0–14.0)
NEUT#: 4.9 10*3/uL (ref 1.5–6.5)
NEUT%: 60.9 % (ref 38.4–76.8)
PLATELETS: 125 10*3/uL — AB (ref 145–400)
RBC: 5.36 10*6/uL (ref 3.70–5.45)
RDW: 15.9 % — ABNORMAL HIGH (ref 11.2–14.5)
WBC: 8.1 10*3/uL (ref 3.9–10.3)
lymph#: 2.3 10*3/uL (ref 0.9–3.3)
nRBC: 0 % (ref 0–0)

## 2014-09-27 MED ORDER — TRAMADOL HCL 50 MG PO TABS: ORAL_TABLET | ORAL | Status: DC

## 2014-09-27 NOTE — Telephone Encounter (Signed)
appts made and avs pritned for pt,email to mw to add chemo

## 2014-09-27 NOTE — Telephone Encounter (Signed)
Per staff message and POF I have scheduled appts. Advised scheduler of appts. JMW  

## 2014-09-27 NOTE — Telephone Encounter (Signed)
Called and lm with new appt times on 3/15  anne

## 2014-09-27 NOTE — Progress Notes (Signed)
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  Patient had no aching with granix injections since using claritin. She had had no nausea, appetite is great and bowels are moving regularly. Fortunately Tramadol 100 mg every 8 hrs prn resolved the pain RLE, as she did not get authorization from insurance to allow visit back to Dr Nelva Bush. Last tramadol was 3 days ago, with no pain RLE now. No worsening of slight peripheral neuropathy in feet.  PAC in Flu vaccine done  ONCOLOGIC HISTORY Patient presented to Dr Dellis Filbert with new onset postmenopausal spotting. [place] 05-07-14 reportedly had thickened endometrial stripe, with 3 fibroids largest 5x 5x 3.5 cm; endometrial biopsy 05-07-14 reportedly was concerning for at least FIGO grade 1 endometrial carcinoma. She had consultation with Dr Denman George on 05-20-14. Surgery was robotic hysterectomy, BSO, bilateral pelvic and para-aortic nodes by Dr Skeet Latch at [facility] on 06-23-14. Patient did well with surgery, discharged home on POD #1. Pathology [id number]) found mixed high grade carcinoma (serous 50% and endometrioid 50%) FIGO grade 3, involving inner half of myometrium with depth 5 mm where wall 3 cm thick, no serosal or lower uterine segment involvement, no cervical or adnexal involvement, LVSI present, 0/15 nodes involved. PET/ CT at [facility] 07-30-14 had no findings of concern for distant disease. She was presented at [facility] multidisciplinary conference, with recommendations from that conference and from Dr Skeet Latch for 6 cycles of taxane platinum chemotherapy followed by vaginal brachytherapy, and for  genetics counseling. Patient and husband attended chemotherapy teaching class prior to visit today. She has not had radiation oncology consultation as yet. She had follow up with Dr Skeet Latch on 08-06-14 at [facility], and will see her next in [place] in ~ 3 months. Cycle 1 carboplatin taxol was given 2-08-24-14    Review of systems as above, also: No fever or symptoms of infection. No abdominal or pelvic pain. No bleeding. No skin rash. No swelling LE.  Is able to sleep. Following blood pressures at home. No orthostatic symptoms Remainder of 10 point Review of Systems negative.  Objective:  Vital signs in last 24 hours:  BP 135/84 mmHg  Pulse 106  Temp(Src) 97.8 F (36.6 C) (Oral)  Resp 18  Ht 5\' 2"  (1.575 m)  Wt 240 lb 4.8 oz (108.999 kg)  BMI 43.94 kg/m2  SpO2 100% Weight is up 2 lbs Alert, oriented and appropriate. Ambulatory without difficulty.  Alopecia  HEENT:PERRL, sclerae not icteric. Oral mucosa moist without lesions, posterior pharynx clear.  Neck supple. No JVD.  Lymphatics:no cervical,supraclavicular or inguinal adenopathy Resp: clear to auscultation bilaterally and normal percussion bilaterally Cardio: regular rate and rhythm. No gallop. GI: abdomen soft, nontender, not distended, no mass or organomegaly. Normally active bowel sounds. Surgical incision not remarkable. Musculoskeletal/ Extremities: without pitting edema, cords, tenderness Neuro: no peripheral neuropathy. Otherwise nonfocal Skin without rash, ecchymosis, petechiae Portacath-without erythema or tenderness  Lab Results:  Results for orders placed or performed in visit on 09/27/14  CBC with Differential  Result Value Ref Range   WBC 8.1 3.9 - 10.3 10e3/uL   NEUT# 4.9 1.5 - 6.5 10e3/uL   HGB 11.3 (L) 11.6 - 15.9 g/dL  HCT 36.6 34.8 - 46.6 %   Platelets 125 (L) 145 - 400 10e3/uL   MCV 68.3 (L) 79.5 - 101.0 fL   MCH 21.1 (L) 25.1 - 34.0 pg   MCHC 30.9 (L) 31.5 - 36.0 g/dL   RBC 5.36 3.70 - 5.45  10e6/uL   RDW 15.9 (H) 11.2 - 14.5 %   lymph# 2.3 0.9 - 3.3 10e3/uL   MONO# 0.7 0.1 - 0.9 10e3/uL   Eosinophils Absolute 0.1 0.0 - 0.5 10e3/uL   Basophils Absolute 0.0 0.0 - 0.1 10e3/uL   NEUT% 60.9 38.4 - 76.8 %   LYMPH% 28.5 14.0 - 49.7 %   MONO% 8.7 0.0 - 14.0 %   EOS% 1.5 0.0 - 7.0 %   BASO% 0.4 0.0 - 2.0 %   nRBC 0 0 - 0 %     Studies/Results:  No results found.  Medications: I have reviewed the patient's current medications. Tramadol refilled, to use prn. 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 Tolerated cycle 2 better with EMEND and claritin with gCSF. Cycle 3 will be given on 10-05-14 as long as ANC >=1.5 and plt >=100k. She will have gCSF 3-21,22,23 and I will see her back the following week. 2.hypotension/ orthostatic symptoms resolved with IVF after cycle 1: back on daily lasix with BP and usual antihypertensives. Follow home BP and adjust after next treatments if needed. She will keep scheduled appointment with Dr Charlett Blake 10-26-14 3.PAC in 4.aches from taxol and gCSF. Less with claritin beginning day of chemo and prn tramadol  5.right lateral LE pain: exacerbation of previous orthopedic problem, unable to be seen at Dr Nelva Bush' office last week, but improved with increase in tramadol. 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    Chemo and gCSF orders  entered. Time spent 25 min including >50% counseling and coordination of care.   LIVESAY,LENNIS P, MD   09/27/2014, 10:50 AM

## 2014-09-30 ENCOUNTER — Telehealth: Payer: Self-pay | Admitting: *Deleted

## 2014-09-30 ENCOUNTER — Other Ambulatory Visit: Payer: Self-pay | Admitting: *Deleted

## 2014-09-30 ENCOUNTER — Ambulatory Visit (HOSPITAL_BASED_OUTPATIENT_CLINIC_OR_DEPARTMENT_OTHER): Payer: Commercial Managed Care - HMO | Admitting: Nurse Practitioner

## 2014-09-30 ENCOUNTER — Ambulatory Visit (HOSPITAL_BASED_OUTPATIENT_CLINIC_OR_DEPARTMENT_OTHER): Payer: Commercial Managed Care - HMO

## 2014-09-30 VITALS — BP 124/76 | HR 99 | Temp 98.7°F | Resp 16 | Wt 245.3 lb

## 2014-09-30 DIAGNOSIS — J40 Bronchitis, not specified as acute or chronic: Secondary | ICD-10-CM

## 2014-09-30 DIAGNOSIS — C541 Malignant neoplasm of endometrium: Secondary | ICD-10-CM

## 2014-09-30 DIAGNOSIS — R509 Fever, unspecified: Secondary | ICD-10-CM

## 2014-09-30 LAB — CBC WITH DIFFERENTIAL/PLATELET
BASO%: 0.5 % (ref 0.0–2.0)
BASOS ABS: 0 10*3/uL (ref 0.0–0.1)
EOS%: 4.1 % (ref 0.0–7.0)
Eosinophils Absolute: 0.3 10*3/uL (ref 0.0–0.5)
HCT: 34.1 % — ABNORMAL LOW (ref 34.8–46.6)
HEMOGLOBIN: 10.5 g/dL — AB (ref 11.6–15.9)
LYMPH#: 1.5 10*3/uL (ref 0.9–3.3)
LYMPH%: 24.7 % (ref 14.0–49.7)
MCH: 20.9 pg — AB (ref 25.1–34.0)
MCHC: 30.8 g/dL — ABNORMAL LOW (ref 31.5–36.0)
MCV: 67.9 fL — AB (ref 79.5–101.0)
MONO#: 0.4 10*3/uL (ref 0.1–0.9)
MONO%: 6.8 % (ref 0.0–14.0)
NEUT%: 63.9 % (ref 38.4–76.8)
NEUTROS ABS: 3.9 10*3/uL (ref 1.5–6.5)
Platelets: 139 10*3/uL — ABNORMAL LOW (ref 145–400)
RBC: 5.02 10*6/uL (ref 3.70–5.45)
RDW: 16.1 % — ABNORMAL HIGH (ref 11.2–14.5)
WBC: 6.2 10*3/uL (ref 3.9–10.3)
nRBC: 0 % (ref 0–0)

## 2014-09-30 LAB — COMPREHENSIVE METABOLIC PANEL (CC13)
ALBUMIN: 3.2 g/dL — AB (ref 3.5–5.0)
ALT: 21 U/L (ref 0–55)
AST: 18 U/L (ref 5–34)
Alkaline Phosphatase: 84 U/L (ref 40–150)
Anion Gap: 10 mEq/L (ref 3–11)
BILIRUBIN TOTAL: 0.23 mg/dL (ref 0.20–1.20)
BUN: 8.6 mg/dL (ref 7.0–26.0)
CALCIUM: 8.6 mg/dL (ref 8.4–10.4)
CO2: 24 mEq/L (ref 22–29)
Chloride: 105 mEq/L (ref 98–109)
Creatinine: 0.8 mg/dL (ref 0.6–1.1)
EGFR: 88 mL/min/{1.73_m2} — ABNORMAL LOW (ref >90)
Glucose: 137 mg/dl (ref 70–140)
POTASSIUM: 4 meq/L (ref 3.5–5.1)
SODIUM: 139 meq/L (ref 136–145)
TOTAL PROTEIN: 6.5 g/dL (ref 6.4–8.3)

## 2014-09-30 MED ORDER — AZITHROMYCIN 250 MG PO TABS: ORAL_TABLET | ORAL | Status: DC

## 2014-09-30 NOTE — Telephone Encounter (Signed)
Received VM from patient stating she has had a sore throat since Tuesday evening and a productive cough with thick, yellow sputum. Returned call to patient to get more information - she states she also had a fever of 100.1 this morning but took Tylenol and it went back down to normal. Dr. Marko Plume notified and she states patient can see Selena Lesser, NP today. POF placed and patient agreeable to arrive at 9:15am for lab and then appt with Cyndee. Juliann Pulse, RN working with [employer] today notified of patient.

## 2014-10-01 NOTE — Assessment & Plan Note (Signed)
Patient received cycle 2 of her carboplatin/Taxol chemotherapy regimen on 09/14/2014.  She is scheduled to return on 10/05/2014 for labs and her next cycle of chemotherapy.

## 2014-10-01 NOTE — Assessment & Plan Note (Signed)
Patient complaint of three-day history of congested, productive cough with yellow secretions.  She also complains of a sore throat when she coughs only.  She has had a fever to maximum 100.1 in the past few days as well.  On exam today-oropharynx clear; with no erythema or pustules.  Patient does have a mild, congested cough; but lungs are clear with no wheezes on exam.  No acute respiratory distress.  Vital signs stable; and patient afebrile today.  Will prescribe patient a Z-Pak for probable bronchitis-like symptoms.  Patient was advised to call/return or directly to the emergency department for any worsening symptoms whatsoever.

## 2014-10-01 NOTE — Progress Notes (Signed)
SYMPTOM MANAGEMENT [facility]IC   HPI: Katelyn Fisher 64 y.o. female diagnosed with endometrial cancer.  Currently undergoing carboplatin/Taxol chemotherapy regimen.  Patient called the cancer Center today requesting urgent care visit.  Patient is complaining of a few day history of a congested, productive cough with yellow secretions.  She states that her throat hurts when she coughs only.  She has had a fever to maximum of 100.1.  She denies any other new symptoms whatsoever.   HPI  ROS  Past Medical History  Diagnosis Date  . Asthma   . Hypertension   . Obesity   . Hx of colonic polyps   . GERD (gastroesophageal reflux disease)   . History of colonic diverticulitis   . Arthritis     back  . Hyperlipidemia   . Depression   . Low back pain radiating to right leg 08/18/2012  . Anxiety   . Benign paroxysmal positional vertigo 11/01/2012  . Thyroid disease 06/01/2013  . Cervical cancer screening 10/09/2010    Qualifier: Diagnosis of  By: Wynona Luna   . 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  . Preventative health care 12/09/2013  . Cancer     endometrial    Past Surgical History  Procedure Laterality Date  . Cholecystectomy  1991  . Dilation and curettage of uterus    . Tubal ligation    . Appendectomy    . Abdominal hysterectomy  06/23/14    [facility], TRH/BSO    has Obesity; Essential hypertension; ASTHMA; COLONIC POLYPS, HX OF; POSTMENOPAUSAL STATUS; GERD; DIVERTICULITIS, HX OF; Cervical cancer screening; Obstructive sleep apnea; Osteoarthritis; Diabetes mellitus type 2, controlled; Hyperlipidemia; Anemia; Bronchitis; Low back pain radiating to right leg; Benign paroxysmal positional vertigo; Thyroid disease; Lipodermatosclerosis; Preventative health care; Endometrial cancer; and Chemotherapy-induced peripheral neuropathy on her problem list.    is allergic to oxycodone; penicillins; shellfish allergy; iodine; and sulfa antibiotics.    Medication List       This list is  accurate as of: 09/30/14 11:59 PM.  Always use your most recent med list.               albuterol 108 (90 BASE) MCG/ACT inhaler  Commonly known as:  PROAIR HFA  Inhale 2 puffs into the lungs 2 (two) times daily as needed.     albuterol (2.5 MG/3ML) 0.083% nebulizer solution  Commonly known as:  PROVENTIL  Take 3 mLs (2.5 mg total) by nebulization every 6 (six) hours as needed for wheezing or shortness of breath.     ALIGN 4 MG Caps  Take 1 capsule by mouth daily.     amLODipine 10 MG tablet  Commonly known as:  NORVASC  Take 1 tablet (10 mg total) by mouth daily.     aspirin 81 MG tablet  Take 81 mg by mouth daily.     atorvastatin 20 MG tablet  Commonly known as:  LIPITOR  Take 10 mg by mouth daily.     azithromycin 250 MG tablet  Commonly known as:  ZITHROMAX Z-PAK  Take 2 tabs (500 mg) PO on day # 1; then take 1 tab (250 mg) PO QD till gone.     benazepril 40 MG tablet  Commonly known as:  LOTENSIN  Take 1 tablet (40 mg total) by mouth daily.     budesonide-formoterol 160-4.5 MCG/ACT inhaler  Commonly known as:  SYMBICORT  Inhale 2 puffs into the lungs 2 (two) times daily.     dexamethasone 4 MG tablet  Commonly known as:  DECADRON  Take 5 tabs with food (=68m) 12 hrs prior to chemo and 6 hrs with food prior to chemo     diclofenac sodium 1 % Gel  Commonly known as:  VOLTAREN  Apply 2 g topically 4 (four) times daily as needed.     fluticasone 50 MCG/ACT nasal spray  Commonly known as:  FLONASE  Place 2 sprays into the nose daily.     furosemide 20 MG tablet  Commonly known as:  LASIX  Take 1 tablet (20 mg total) by mouth daily.     HYDROcodone-homatropine 5-1.5 MG/5ML syrup  Commonly known as:  HYCODAN  Take 5 mLs by mouth every 8 (eight) hours as needed for cough.     levothyroxine 25 MCG tablet  Commonly known as:  SYNTHROID, LEVOTHROID  Take 1 tablet (25 mcg total) by mouth as directed. 1 tab daily except on Saturdays take 2 tabs      lidocaine-prilocaine cream  Commonly known as:  EMLA  Apply 1-2 hrs to [facility] site  prior access as directed.     LORazepam 1 MG tablet  Commonly known as:  ATIVAN  Place 1 tablet under the tongue or swallow every 6 hours as needed for nausea.  Will make you drowsy.     meloxicam 15 MG tablet  Commonly known as:  MOBIC  Take 15 mg by mouth daily as needed.     multivitamin tablet  Take 1 tablet by mouth daily.     nebivolol 10 MG tablet  Commonly known as:  BYSTOLIC  Take 1 tablet (10 mg total) by mouth 2 (two) times daily.     ondansetron 8 MG tablet  Commonly known as:  ZOFRAN  Take 1 tablet (8 mg total) by mouth every 8 (eight) hours as needed for nausea or vomiting. Will  not make drowsy.     pantoprazole 40 MG tablet  Commonly known as:  PROTONIX  Take 1 tablet (40 mg total) by mouth daily.     pentoxifylline 400 MG CR tablet  Commonly known as:  TRENTAL  Take 400 mg by mouth 2 (two) times daily.     STRESS B-COMPLEX/C/ZINC PO  Take 1 tablet by mouth daily.     traMADol 50 MG tablet  Commonly known as:  ULTRAM  Take 1-2 tablets by mouth as needed every 8 hours for pain.     triamcinolone cream 0.1 %  Commonly known as:  KENALOG  Apply 1 application topically. Apply topically to affected area once daily         PHYSICAL EXAMINATION  Oncology Vitals 09/30/2014 09/27/2014 09/22/2014 09/21/2014 09/20/2014 09/14/2014 09/06/2014  Height - 158 cm - - 158 cm - 158 cm  Weight 111.267 kg 108.999 kg - - 108.228 kg - 110.496 kg  Weight (lbs) 245 lbs 5 oz 240 lbs 5 oz - - 238 lbs 10 oz - 243 lbs 10 oz  BMI (kg/m2) - 43.95 kg/m2 - - 43.64 kg/m2 - 44.55 kg/m2  Temp 98.7 97.8 98.6 98.2 98.5 97.9 98.8  Pulse 99 106 104 106 100 93 92  Resp 16 18 - - _0 SpO2 - 100 - - - 100 -  BSA (m2) - 2.18 m2 - - 2.18 m2 - 2.2 m2   BP Readings from Last 3 Encounters:  09/30/14 124/76  09/27/14 135/84  09/22/14 142/87    Physical Exam  Constitutional: She is oriented to person,  place, and  time and well-developed, well-nourished, and in no distress.  HENT:  Head: Normocephalic and atraumatic.  Mouth/Throat: Oropharynx is clear and moist.  Eyes: Conjunctivae and EOM are normal. Pupils are equal, round, and reactive to light. Right eye exhibits no discharge. Left eye exhibits no discharge. No scleral icterus.  Neck: Normal range of motion. Neck supple. No JVD present. No tracheal deviation present. No thyromegaly present.  Cardiovascular: Normal rate, regular rhythm, normal heart sounds and intact distal pulses.   Pulmonary/Chest: Effort normal and breath sounds normal. No respiratory distress. She has no wheezes. She has no rales. She exhibits no tenderness.  Occasional congested cough on exam.  Abdominal: Soft. Bowel sounds are normal. She exhibits no distension and no mass. There is no tenderness. There is no rebound and no guarding.  Musculoskeletal: Normal range of motion. She exhibits no edema or tenderness.  Lymphadenopathy:    She has no cervical adenopathy.  Neurological: She is alert and oriented to person, place, and time. Gait normal.  Skin: Skin is warm and dry. No rash noted. No erythema. No pallor.  Psychiatric: Affect normal.  Nursing note and vitals reviewed.   LABORATORY DATA:. Appointment on 09/30/2014  Component Date Value Ref Range Status  . WBC 09/30/2014 6.2  3.9 - 10.3 10e3/uL Final  . NEUT# 09/30/2014 3.9  1.5 - 6.5 10e3/uL Final  . HGB 09/30/2014 10.5* 11.6 - 15.9 g/dL Final  . HCT 09/30/2014 34.1* 34.8 - 46.6 % Final  . Platelets 09/30/2014 139* 145 - 400 10e3/uL Final  . MCV 09/30/2014 67.9* 79.5 - 101.0 fL Final  . MCH 09/30/2014 20.9* 25.1 - 34.0 pg Final  . MCHC 09/30/2014 30.8* 31.5 - 36.0 g/dL Final  . RBC 09/30/2014 5.02  3.70 - 5.45 10e6/uL Final  . RDW 09/30/2014 16.1* 11.2 - 14.5 % Final  . lymph# 09/30/2014 1.5  0.9 - 3.3 10e3/uL Final  . MONO# 09/30/2014 0.4  0.1 - 0.9 10e3/uL Final  . Eosinophils Absolute 09/30/2014 0.3   0.0 - 0.5 10e3/uL Final  . Basophils Absolute 09/30/2014 0.0  0.0 - 0.1 10e3/uL Final  . NEUT% 09/30/2014 63.9  38.4 - 76.8 % Final  . LYMPH% 09/30/2014 24.7  14.0 - 49.7 % Final  . MONO% 09/30/2014 6.8  0.0 - 14.0 % Final  . EOS% 09/30/2014 4.1  0.0 - 7.0 % Final  . BASO% 09/30/2014 0.5  0.0 - 2.0 % Final  . nRBC 09/30/2014 0  0 - 0 % Final  . Sodium 09/30/2014 139  136 - 145 mEq/L Final  . Potassium 09/30/2014 4.0  3.5 - 5.1 mEq/L Final  . Chloride 09/30/2014 105  98 - 109 mEq/L Final  . CO2 09/30/2014 24  22 - 29 mEq/L Final  . Glucose 09/30/2014 137  70 - 140 mg/dl Final  . BUN 09/30/2014 8.6  7.0 - 26.0 mg/dL Final  . Creatinine 09/30/2014 0.8  0.6 - 1.1 mg/dL Final  . Total Bilirubin 09/30/2014 0.23  0.20 - 1.20 mg/dL Final  . Alkaline Phosphatase 09/30/2014 84  40 - 150 U/L Final  . AST 09/30/2014 18  5 - 34 U/L Final  . ALT 09/30/2014 21  0 - 55 U/L Final  . Total Protein 09/30/2014 6.5  6.4 - 8.3 g/dL Final  . Albumin 09/30/2014 3.2* 3.5 - 5.0 g/dL Final  . Calcium 09/30/2014 8.6  8.4 - 10.4 mg/dL Final  . Anion Gap 09/30/2014 10  3 - 11 mEq/L Final  . EGFR 09/30/2014 88* >90  ml/min/1.73 m2 Final   eGFR is calculated using the CKD-EPI Creatinine Equation (2009)     RADIOGRAPHIC STUDIES: No results found.  ASSESSMENT/PLAN:    Bronchitis Patient complaint of three-day history of congested, productive cough with yellow secretions.  She also complains of a sore throat when she coughs only.  She has had a fever to maximum 100.1 in the past few days as well.  On exam today-oropharynx clear; with no erythema or pustules.  Patient does have a mild, congested cough; but lungs are clear with no wheezes on exam.  No acute respiratory distress.  Vital signs stable; and patient afebrile today.  Will prescribe patient a Z-Pak for probable bronchitis-like symptoms.  Patient was advised to call/return or directly to the emergency department for any worsening symptoms  whatsoever.   Endometrial cancer Patient received cycle 2 of her carboplatin/Taxol chemotherapy regimen on 09/14/2014.  She is scheduled to return on 10/05/2014 for labs and her next cycle of chemotherapy.    Patient stated understanding of all instructions; and was in agreement with this plan of care. The patient knows to call the clinic with any problems, questions or concerns.   Review/collaboration with Dr. Marko Plume regarding all aspects of patient's visit today.   Total time spent with patient was 25 minutes;  with greater than 75 percent of that time spent in face to face counseling regarding patient's symptoms,  and coordination of care and follow up.  Disclaimer: This note was dictated with voice recognition software. Similar sounding words can inadvertently be transcribed and may not be corrected upon review.   Drue Second, NP 10/01/2014

## 2014-10-05 ENCOUNTER — Other Ambulatory Visit: Payer: Commercial Managed Care - HMO

## 2014-10-05 ENCOUNTER — Ambulatory Visit (HOSPITAL_BASED_OUTPATIENT_CLINIC_OR_DEPARTMENT_OTHER): Payer: Commercial Managed Care - HMO

## 2014-10-05 ENCOUNTER — Other Ambulatory Visit (HOSPITAL_BASED_OUTPATIENT_CLINIC_OR_DEPARTMENT_OTHER): Payer: Commercial Managed Care - HMO

## 2014-10-05 DIAGNOSIS — C541 Malignant neoplasm of endometrium: Secondary | ICD-10-CM

## 2014-10-05 DIAGNOSIS — Z5111 Encounter for antineoplastic chemotherapy: Secondary | ICD-10-CM

## 2014-10-05 LAB — CBC WITH DIFFERENTIAL/PLATELET
BASO%: 0 % (ref 0.0–2.0)
Basophils Absolute: 0 10*3/uL (ref 0.0–0.1)
EOS ABS: 0 10*3/uL (ref 0.0–0.5)
EOS%: 0 % (ref 0.0–7.0)
HCT: 37.3 % (ref 34.8–46.6)
HGB: 11.3 g/dL — ABNORMAL LOW (ref 11.6–15.9)
LYMPH#: 1 10*3/uL (ref 0.9–3.3)
LYMPH%: 12.4 % — ABNORMAL LOW (ref 14.0–49.7)
MCH: 20.7 pg — ABNORMAL LOW (ref 25.1–34.0)
MCHC: 30.3 g/dL — ABNORMAL LOW (ref 31.5–36.0)
MCV: 68.4 fL — ABNORMAL LOW (ref 79.5–101.0)
MONO#: 0 10*3/uL — ABNORMAL LOW (ref 0.1–0.9)
MONO%: 0.5 % (ref 0.0–14.0)
NEUT%: 87.1 % — ABNORMAL HIGH (ref 38.4–76.8)
NEUTROS ABS: 6.9 10*3/uL — AB (ref 1.5–6.5)
Platelets: 311 10*3/uL (ref 145–400)
RBC: 5.45 10*6/uL (ref 3.70–5.45)
RDW: 16.7 % — AB (ref 11.2–14.5)
WBC: 7.9 10*3/uL (ref 3.9–10.3)

## 2014-10-05 LAB — COMPREHENSIVE METABOLIC PANEL (CC13)
ALBUMIN: 3.5 g/dL (ref 3.5–5.0)
ALK PHOS: 94 U/L (ref 40–150)
ALT: 24 U/L (ref 0–55)
AST: 16 U/L (ref 5–34)
Anion Gap: 11 mEq/L (ref 3–11)
BUN: 11.6 mg/dL (ref 7.0–26.0)
CHLORIDE: 107 meq/L (ref 98–109)
CO2: 22 mEq/L (ref 22–29)
Calcium: 9.7 mg/dL (ref 8.4–10.4)
Creatinine: 0.9 mg/dL (ref 0.6–1.1)
EGFR: 83 mL/min/{1.73_m2} — ABNORMAL LOW (ref >90)
GLUCOSE: 249 mg/dL — AB (ref 70–140)
POTASSIUM: 4.5 meq/L (ref 3.5–5.1)
SODIUM: 140 meq/L (ref 136–145)
TOTAL PROTEIN: 7.5 g/dL (ref 6.4–8.3)
Total Bilirubin: 0.26 mg/dL (ref 0.20–1.20)

## 2014-10-05 MED ORDER — DIPHENHYDRAMINE HCL 50 MG/ML IJ SOLN
50.0000 mg | Freq: Once | INTRAMUSCULAR | Status: AC
  Administered 2014-10-05: 50 mg via INTRAVENOUS

## 2014-10-05 MED ORDER — FAMOTIDINE IN NACL 20-0.9 MG/50ML-% IV SOLN
INTRAVENOUS | Status: AC
  Filled 2014-10-05: qty 50

## 2014-10-05 MED ORDER — HEPARIN SOD (PORK) LOCK FLUSH 100 UNIT/ML IV SOLN
500.0000 [IU] | Freq: Once | INTRAVENOUS | Status: AC | PRN
Start: 2014-10-05 — End: 2014-10-05
  Administered 2014-10-05: 500 [IU]
  Filled 2014-10-05: qty 5

## 2014-10-05 MED ORDER — DIPHENHYDRAMINE HCL 50 MG/ML IJ SOLN
INTRAMUSCULAR | Status: AC
  Filled 2014-10-05: qty 1

## 2014-10-05 MED ORDER — SODIUM CHLORIDE 0.9 % IJ SOLN
10.0000 mL | INTRAMUSCULAR | Status: DC | PRN
  Administered 2014-10-05: 10 mL
  Filled 2014-10-05: qty 10

## 2014-10-05 MED ORDER — PACLITAXEL CHEMO INJECTION 300 MG/50ML
175.0000 mg/m2 | Freq: Once | INTRAVENOUS | Status: AC
  Administered 2014-10-05: 384 mg via INTRAVENOUS
  Filled 2014-10-05: qty 64

## 2014-10-05 MED ORDER — SODIUM CHLORIDE 0.9 % IV SOLN
Freq: Once | INTRAVENOUS | Status: AC
  Administered 2014-10-05: 13:00:00 via INTRAVENOUS
  Filled 2014-10-05: qty 8

## 2014-10-05 MED ORDER — SODIUM CHLORIDE 0.9 % IV SOLN
691.0000 mg | Freq: Once | INTRAVENOUS | Status: AC
  Administered 2014-10-05: 690 mg via INTRAVENOUS
  Filled 2014-10-05: qty 69

## 2014-10-05 MED ORDER — SODIUM CHLORIDE 0.9 % IV SOLN
Freq: Once | INTRAVENOUS | Status: AC
  Administered 2014-10-05: 13:00:00 via INTRAVENOUS
  Filled 2014-10-05: qty 5

## 2014-10-05 MED ORDER — SODIUM CHLORIDE 0.9 % IV SOLN
Freq: Once | INTRAVENOUS | Status: AC
  Administered 2014-10-05: 13:00:00 via INTRAVENOUS

## 2014-10-05 MED ORDER — FAMOTIDINE IN NACL 20-0.9 MG/50ML-% IV SOLN
20.0000 mg | Freq: Once | INTRAVENOUS | Status: AC
Start: 2014-10-05 — End: 2014-10-05
  Administered 2014-10-05: 20 mg via INTRAVENOUS

## 2014-10-05 NOTE — Patient Instructions (Signed)
Carboplatin injection  What is this medicine?  CARBOPLATIN (KAR boe pla tin) is a chemotherapy drug. It targets fast dividing cells, like cancer cells, and causes these cells to die. This medicine is used to treat ovarian cancer and many other cancers.  This medicine may be used for other purposes; ask your health care provider or pharmacist if you have questions.  COMMON BRAND NAME(S): Paraplatin  What should I tell my health care provider before I take this medicine?  They need to know if you have any of these conditions:  -blood disorders  -hearing problems  -kidney disease  -recent or ongoing radiation therapy  -an unusual or allergic reaction to carboplatin, cisplatin, other chemotherapy, other medicines, foods, dyes, or preservatives  -pregnant or trying to get pregnant  -breast-feeding  How should I use this medicine?  This drug is usually given as an infusion into a vein. It is administered in a hospital or clinic by a specially trained health care professional.  Talk to your pediatrician regarding the use of this medicine in children. Special care may be needed.  Overdosage: If you think you have taken too much of this medicine contact a poison control center or emergency room at once.  NOTE: This medicine is only for you. Do not share this medicine with others.  What if I miss a dose?  It is important not to miss a dose. Call your doctor or health care professional if you are unable to keep an appointment.  What may interact with this medicine?  -medicines for seizures  -medicines to increase blood counts like filgrastim, pegfilgrastim, sargramostim  -some antibiotics like amikacin, gentamicin, neomycin, streptomycin, tobramycin  -vaccines  Talk to your doctor or health care professional before taking any of these medicines:  -acetaminophen  -aspirin  -ibuprofen  -ketoprofen  -naproxen  This list may not describe all possible interactions. Give your health care provider a list of all the medicines, herbs,  non-prescription drugs, or dietary supplements you use. Also tell them if you smoke, drink alcohol, or use illegal drugs. Some items may interact with your medicine.  What should I watch for while using this medicine?  Your condition will be monitored carefully while you are receiving this medicine. You will need important blood work done while you are taking this medicine.  This drug may make you feel generally unwell. This is not uncommon, as chemotherapy can affect healthy cells as well as cancer cells. Report any side effects. Continue your course of treatment even though you feel ill unless your doctor tells you to stop.  In some cases, you may be given additional medicines to help with side effects. Follow all directions for their use.  Call your doctor or health care professional for advice if you get a fever, chills or sore throat, or other symptoms of a cold or flu. Do not treat yourself. This drug decreases your body's ability to fight infections. Try to avoid being around people who are sick.  This medicine may increase your risk to bruise or bleed. Call your doctor or health care professional if you notice any unusual bleeding.  Be careful brushing and flossing your teeth or using a toothpick because you may get an infection or bleed more easily. If you have any dental work done, tell your dentist you are receiving this medicine.  Avoid taking products that contain aspirin, acetaminophen, ibuprofen, naproxen, or ketoprofen unless instructed by your doctor. These medicines may hide a fever.  Do not become   pregnant while taking this medicine. Women should inform their doctor if they wish to become pregnant or think they might be pregnant. There is a potential for serious side effects to an unborn child. Talk to your health care professional or pharmacist for more information. Do not breast-feed an infant while taking this medicine.  What side effects may I notice from receiving this medicine?  Side effects  that you should report to your doctor or health care professional as soon as possible:  -allergic reactions like skin rash, itching or hives, swelling of the face, lips, or tongue  -signs of infection - fever or chills, cough, sore throat, pain or difficulty passing urine  -signs of decreased platelets or bleeding - bruising, pinpoint red spots on the skin, black, tarry stools, nosebleeds  -signs of decreased red blood cells - unusually weak or tired, fainting spells, lightheadedness  -breathing problems  -changes in hearing  -changes in vision  -chest pain  -high blood pressure  -low blood counts - This drug may decrease the number of white blood cells, red blood cells and platelets. You may be at increased risk for infections and bleeding.  -nausea and vomiting  -pain, swelling, redness or irritation at the injection site  -pain, tingling, numbness in the hands or feet  -problems with balance, talking, walking  -trouble passing urine or change in the amount of urine  Side effects that usually do not require medical attention (report to your doctor or health care professional if they continue or are bothersome):  -hair loss  -loss of appetite  -metallic taste in the mouth or changes in taste  This list may not describe all possible side effects. Call your doctor for medical advice about side effects. You may report side effects to FDA at 1-800-FDA-1088.  Where should I keep my medicine?  This drug is given in a hospital or clinic and will not be stored at home.  NOTE: This sheet is a summary. It may not cover all possible information. If you have questions about this medicine, talk to your doctor, pharmacist, or health care provider.   2015, Elsevier/Gold Standard. (2007-10-14 14:38:05)  Paclitaxel injection  What is this medicine?  PACLITAXEL (PAK li TAX el) is a chemotherapy drug. It targets fast dividing cells, like cancer cells, and causes these cells to die. This medicine is used to treat ovarian cancer,  breast cancer, and other cancers.  This medicine may be used for other purposes; ask your health care provider or pharmacist if you have questions.  COMMON BRAND NAME(S): Onxol, Taxol  What should I tell my health care provider before I take this medicine?  They need to know if you have any of these conditions:  -blood disorders  -irregular heartbeat  -infection (especially a virus infection such as chickenpox, cold sores, or herpes)  -liver disease  -previous or ongoing radiation therapy  -an unusual or allergic reaction to paclitaxel, alcohol, polyoxyethylated castor oil, other chemotherapy agents, other medicines, foods, dyes, or preservatives  -pregnant or trying to get pregnant  -breast-feeding  How should I use this medicine?  This drug is given as an infusion into a vein. It is administered in a hospital or clinic by a specially trained health care professional.  Talk to your pediatrician regarding the use of this medicine in children. Special care may be needed.  Overdosage: If you think you have taken too much of this medicine contact a poison control center or emergency room at once.  NOTE:   This medicine is only for you. Do not share this medicine with others.  What if I miss a dose?  It is important not to miss your dose. Call your doctor or health care professional if you are unable to keep an appointment.  What may interact with this medicine?  Do not take this medicine with any of the following medications:  -disulfiram  -metronidazole  This medicine may also interact with the following medications:  -cyclosporine  -diazepam  -ketoconazole  -medicines to increase blood counts like filgrastim, pegfilgrastim, sargramostim  -other chemotherapy drugs like cisplatin, doxorubicin, epirubicin, etoposide, teniposide, vincristine  -quinidine  -testosterone  -vaccines  -verapamil  Talk to your doctor or health care professional before taking any of these  medicines:  -acetaminophen  -aspirin  -ibuprofen  -ketoprofen  -naproxen  This list may not describe all possible interactions. Give your health care provider a list of all the medicines, herbs, non-prescription drugs, or dietary supplements you use. Also tell them if you smoke, drink alcohol, or use illegal drugs. Some items may interact with your medicine.  What should I watch for while using this medicine?  Your condition will be monitored carefully while you are receiving this medicine. You will need important blood work done while you are taking this medicine.  This drug may make you feel generally unwell. This is not uncommon, as chemotherapy can affect healthy cells as well as cancer cells. Report any side effects. Continue your course of treatment even though you feel ill unless your doctor tells you to stop.  In some cases, you may be given additional medicines to help with side effects. Follow all directions for their use.  Call your doctor or health care professional for advice if you get a fever, chills or sore throat, or other symptoms of a cold or flu. Do not treat yourself. This drug decreases your body's ability to fight infections. Try to avoid being around people who are sick.  This medicine may increase your risk to bruise or bleed. Call your doctor or health care professional if you notice any unusual bleeding.  Be careful brushing and flossing your teeth or using a toothpick because you may get an infection or bleed more easily. If you have any dental work done, tell your dentist you are receiving this medicine.  Avoid taking products that contain aspirin, acetaminophen, ibuprofen, naproxen, or ketoprofen unless instructed by your doctor. These medicines may hide a fever.  Do not become pregnant while taking this medicine. Women should inform their doctor if they wish to become pregnant or think they might be pregnant. There is a potential for serious side effects to an unborn child. Talk to  your health care professional or pharmacist for more information. Do not breast-feed an infant while taking this medicine.  Men are advised not to father a child while receiving this medicine.  What side effects may I notice from receiving this medicine?  Side effects that you should report to your doctor or health care professional as soon as possible:  -allergic reactions like skin rash, itching or hives, swelling of the face, lips, or tongue  -low blood counts - This drug may decrease the number of white blood cells, red blood cells and platelets. You may be at increased risk for infections and bleeding.  -signs of infection - fever or chills, cough, sore throat, pain or difficulty passing urine  -signs of decreased platelets or bleeding - bruising, pinpoint red spots on the skin, black, tarry   stools, nosebleeds  -signs of decreased red blood cells - unusually weak or tired, fainting spells, lightheadedness  -breathing problems  -chest pain  -high or low blood pressure  -mouth sores  -nausea and vomiting  -pain, swelling, redness or irritation at the injection site  -pain, tingling, numbness in the hands or feet  -slow or irregular heartbeat  -swelling of the ankle, feet, hands  Side effects that usually do not require medical attention (report to your doctor or health care professional if they continue or are bothersome):  -bone pain  -complete hair loss including hair on your head, underarms, pubic hair, eyebrows, and eyelashes  -changes in the color of fingernails  -diarrhea  -loosening of the fingernails  -loss of appetite  -muscle or joint pain  -red flush to skin  -sweating  This list may not describe all possible side effects. Call your doctor for medical advice about side effects. You may report side effects to FDA at 1-800-FDA-1088.  Where should I keep my medicine?  This drug is given in a hospital or clinic and will not be stored at home.  NOTE: This sheet is a summary. It may not cover all possible  information. If you have questions about this medicine, talk to your doctor, pharmacist, or health care provider.   2015, Elsevier/Gold Standard. (2012-09-01 16:41:21)

## 2014-10-11 ENCOUNTER — Ambulatory Visit (HOSPITAL_BASED_OUTPATIENT_CLINIC_OR_DEPARTMENT_OTHER): Payer: Commercial Managed Care - HMO

## 2014-10-11 DIAGNOSIS — Z5189 Encounter for other specified aftercare: Secondary | ICD-10-CM

## 2014-10-11 DIAGNOSIS — C541 Malignant neoplasm of endometrium: Secondary | ICD-10-CM

## 2014-10-11 MED ORDER — TBO-FILGRASTIM 300 MCG/0.5ML ~~LOC~~ SOSY
300.0000 ug | PREFILLED_SYRINGE | Freq: Once | SUBCUTANEOUS | Status: AC
  Administered 2014-10-11: 300 ug via SUBCUTANEOUS
  Filled 2014-10-11: qty 0.5

## 2014-10-12 ENCOUNTER — Ambulatory Visit (HOSPITAL_BASED_OUTPATIENT_CLINIC_OR_DEPARTMENT_OTHER): Payer: Commercial Managed Care - HMO

## 2014-10-12 DIAGNOSIS — Z5189 Encounter for other specified aftercare: Secondary | ICD-10-CM | POA: Diagnosis not present

## 2014-10-12 DIAGNOSIS — C541 Malignant neoplasm of endometrium: Secondary | ICD-10-CM

## 2014-10-12 MED ORDER — TBO-FILGRASTIM 300 MCG/0.5ML ~~LOC~~ SOSY
300.0000 ug | PREFILLED_SYRINGE | Freq: Once | SUBCUTANEOUS | Status: AC
  Administered 2014-10-12: 300 ug via SUBCUTANEOUS
  Filled 2014-10-12: qty 0.5

## 2014-10-12 NOTE — Patient Instructions (Signed)

## 2014-10-13 ENCOUNTER — Ambulatory Visit (HOSPITAL_BASED_OUTPATIENT_CLINIC_OR_DEPARTMENT_OTHER): Payer: Commercial Managed Care - HMO

## 2014-10-13 DIAGNOSIS — C541 Malignant neoplasm of endometrium: Secondary | ICD-10-CM | POA: Diagnosis not present

## 2014-10-13 DIAGNOSIS — Z5189 Encounter for other specified aftercare: Secondary | ICD-10-CM | POA: Diagnosis not present

## 2014-10-13 MED ORDER — TBO-FILGRASTIM 300 MCG/0.5ML ~~LOC~~ SOSY
300.0000 ug | PREFILLED_SYRINGE | Freq: Once | SUBCUTANEOUS | Status: AC
  Administered 2014-10-13: 300 ug via SUBCUTANEOUS
  Filled 2014-10-13: qty 0.5

## 2014-10-15 ENCOUNTER — Other Ambulatory Visit: Payer: Self-pay | Admitting: Oncology

## 2014-10-15 DIAGNOSIS — C541 Malignant neoplasm of endometrium: Secondary | ICD-10-CM

## 2014-10-17 ENCOUNTER — Other Ambulatory Visit: Payer: Self-pay | Admitting: Oncology

## 2014-10-18 ENCOUNTER — Ambulatory Visit (HOSPITAL_BASED_OUTPATIENT_CLINIC_OR_DEPARTMENT_OTHER): Payer: Commercial Managed Care - HMO | Admitting: Oncology

## 2014-10-18 ENCOUNTER — Encounter: Payer: Self-pay | Admitting: Oncology

## 2014-10-18 ENCOUNTER — Telehealth: Payer: Self-pay | Admitting: Oncology

## 2014-10-18 ENCOUNTER — Other Ambulatory Visit: Payer: Self-pay | Admitting: Family Medicine

## 2014-10-18 ENCOUNTER — Ambulatory Visit (HOSPITAL_BASED_OUTPATIENT_CLINIC_OR_DEPARTMENT_OTHER): Payer: Commercial Managed Care - HMO

## 2014-10-18 VITALS — BP 138/89 | HR 97 | Temp 98.2°F | Resp 18 | Ht 62.0 in | Wt 241.1 lb

## 2014-10-18 DIAGNOSIS — T451X5A Adverse effect of antineoplastic and immunosuppressive drugs, initial encounter: Secondary | ICD-10-CM

## 2014-10-18 DIAGNOSIS — J4520 Mild intermittent asthma, uncomplicated: Secondary | ICD-10-CM

## 2014-10-18 DIAGNOSIS — R112 Nausea with vomiting, unspecified: Secondary | ICD-10-CM | POA: Diagnosis not present

## 2014-10-18 DIAGNOSIS — G620 Drug-induced polyneuropathy: Secondary | ICD-10-CM

## 2014-10-18 DIAGNOSIS — G622 Polyneuropathy due to other toxic agents: Secondary | ICD-10-CM

## 2014-10-18 DIAGNOSIS — I959 Hypotension, unspecified: Secondary | ICD-10-CM

## 2014-10-18 DIAGNOSIS — C541 Malignant neoplasm of endometrium: Secondary | ICD-10-CM

## 2014-10-18 DIAGNOSIS — D702 Other drug-induced agranulocytosis: Secondary | ICD-10-CM

## 2014-10-18 DIAGNOSIS — Z95828 Presence of other vascular implants and grafts: Secondary | ICD-10-CM

## 2014-10-18 LAB — CBC WITH DIFFERENTIAL/PLATELET
BASO%: 0.5 % (ref 0.0–2.0)
Basophils Absolute: 0 10*3/uL (ref 0.0–0.1)
EOS%: 0.6 % (ref 0.0–7.0)
Eosinophils Absolute: 0.1 10*3/uL (ref 0.0–0.5)
HCT: 35.7 % (ref 34.8–46.6)
HEMOGLOBIN: 10.8 g/dL — AB (ref 11.6–15.9)
LYMPH%: 30.3 % (ref 14.0–49.7)
MCH: 20.9 pg — ABNORMAL LOW (ref 25.1–34.0)
MCHC: 30.3 g/dL — ABNORMAL LOW (ref 31.5–36.0)
MCV: 69.1 fL — ABNORMAL LOW (ref 79.5–101.0)
MONO#: 0.9 10*3/uL (ref 0.1–0.9)
MONO%: 11 % (ref 0.0–14.0)
NEUT%: 57.6 % (ref 38.4–76.8)
NEUTROS ABS: 4.5 10*3/uL (ref 1.5–6.5)
NRBC: 1 % — AB (ref 0–0)
Platelets: 133 10*3/uL — ABNORMAL LOW (ref 145–400)
RBC: 5.17 10*6/uL (ref 3.70–5.45)
RDW: 17.2 % — ABNORMAL HIGH (ref 11.2–14.5)
WBC: 7.8 10*3/uL (ref 3.9–10.3)
lymph#: 2.4 10*3/uL (ref 0.9–3.3)

## 2014-10-18 LAB — COMPREHENSIVE METABOLIC PANEL (CC13)
ALBUMIN: 3.4 g/dL — AB (ref 3.5–5.0)
ALT: 27 U/L (ref 0–55)
ANION GAP: 10 meq/L (ref 3–11)
AST: 18 U/L (ref 5–34)
Alkaline Phosphatase: 93 U/L (ref 40–150)
BUN: 13.7 mg/dL (ref 7.0–26.0)
CALCIUM: 9.1 mg/dL (ref 8.4–10.4)
CO2: 26 mEq/L (ref 22–29)
CREATININE: 0.8 mg/dL (ref 0.6–1.1)
Chloride: 108 mEq/L (ref 98–109)
EGFR: >90 mL/min/{1.73_m2} (ref >90)
Glucose: 121 mg/dl (ref 70–140)
Potassium: 3.4 mEq/L — ABNORMAL LOW (ref 3.5–5.1)
Sodium: 144 mEq/L (ref 136–145)
Total Protein: 6.9 g/dL (ref 6.4–8.3)

## 2014-10-18 MED ORDER — TRAMADOL HCL 50 MG PO TABS: ORAL_TABLET | ORAL | Status: DC

## 2014-10-18 MED ORDER — FUROSEMIDE 20 MG PO TABS
20.0000 mg | ORAL_TABLET | Freq: Every day | ORAL | Status: DC
Start: 2014-10-18 — End: 2015-10-05

## 2014-10-18 MED ORDER — DEXAMETHASONE 4 MG PO TABS: ORAL_TABLET | ORAL | Status: DC

## 2014-10-18 MED ORDER — BENAZEPRIL HCL 40 MG PO TABS: 40.0000 mg | ORAL_TABLET | Freq: Every day | ORAL | Status: DC

## 2014-10-18 MED ORDER — LEVOTHYROXINE SODIUM 25 MCG PO TABS: 25.0000 ug | ORAL_TABLET | ORAL | Status: DC

## 2014-10-18 MED ORDER — AMLODIPINE BESYLATE 10 MG PO TABS: 10.0000 mg | ORAL_TABLET | Freq: Every day | ORAL | Status: DC

## 2014-10-18 MED ORDER — FERROUS FUMARATE 325 (106 FE) MG PO TABS: ORAL_TABLET | ORAL | Status: DC

## 2014-10-18 NOTE — Telephone Encounter (Signed)
Appointments made and avs printed for patient °

## 2014-10-18 NOTE — Progress Notes (Signed)
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  Patient has tolerated most recent chemotherapy without significant problems and is feeling well today. Aches with taxol and granix are controlled with claritin. Mild URI symptoms with temp 100.1 on 09-30-14 resolved quickly with z pack, no symptom now. Minimal neuropathy right foot only for a few days after chemo, then resolves. Has used only occasional prn antiemetic, but no significant nausea and no vomiting. Bowels moving daily. No problems with PAC.   PAC in Flu vaccine done  Oncologic History  Patient presented to Dr Dellis Filbert with new onset postmenopausal spotting. [place] 05-07-14 reportedly had thickened endometrial stripe, with 3 fibroids largest 5x 5x 3.5 cm; endometrial biopsy 05-07-14 reportedly was concerning for at least FIGO grade 1 endometrial carcinoma. She had consultation with Dr Denman George on 05-20-14. Surgery was robotic hysterectomy, BSO, bilateral pelvic and para-aortic nodes by Dr Skeet Latch at [facility] on 06-23-14. Patient did well with surgery, discharged home on POD #1. 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 PET/ CT at [facility] 07-30-14 had no findings of concern for distant disease. She was presented at [facility] multidisciplinary conference, with recommendations from that conference and from Dr Skeet Latch for 6 cycles  of taxane platinum chemotherapy followed by vaginal brachytherapy, and for genetics counseling. Patient and husband attended chemotherapy teaching class prior to visit today. She has not had radiation oncology consultation as yet. She had follow up with Dr Skeet Latch on 08-06-14 at [facility], and will see her next in [place] in ~ 3 months. Cycle 1 carboplatin taxol was given 2-08-24-14  Review of systems as above, also: No SOB, no bleeding, no LE swelling, bladder ok, no abdominal or pelvic discomfort Remainder of 10 point Review of Systems negative.  Objective:  Vital signs in last 24 hours:  BP 138/89 mmHg  Pulse 97  Temp(Src) 98.2 F (36.8 C) (Oral)  Resp 18  Ht 5\' 2"  (1.575 m)  Wt 241 lb 1.6 oz (109.362 kg)  BMI 44.09 kg/m2 Weight down 4 lbs Alert, oriented and appropriate. Ambulatory without difficulty. Looks comfortable, very pleasant as always. Alopecia  HEENT:PERRL, sclerae not icteric. Oral mucosa moist without lesions, posterior pharynx clear.  Neck supple. No JVD.  Lymphatics:no cervical,supraclavicular or inguinal adenopathy Resp: clear to auscultation bilaterally and normal percussion bilaterally Cardio: regular rate and rhythm. No gallop. GI: abdomen obese, soft, nontender, not distended, no appreciable mass or organomegaly. Normally active bowel sounds. Surgical incisions not remarkable. Musculoskeletal/ Extremities: without pitting edema, cords, tenderness Neuro: no significant peripheral neuropathy. Otherwise nonfocal.PSYCH appropriate mood and affect Skin without rash, ecchymosis, petechiae Portacath-without erythema or tenderness  Lab Results:  Results for orders placed or performed in visit on 10/18/14  CBC with Differential  Result Value Ref Range   WBC 7.8 3.9 - 10.3 10e3/uL   NEUT# 4.5 1.5 - 6.5 10e3/uL   HGB 10.8 (L) 11.6 - 15.9 g/dL   HCT 35.7 34.8 - 46.6 %  Platelets 133 Large platelets present (L) 145 - 400 10e3/uL   MCV 69.1 (L) 79.5 - 101.0 fL   MCH  20.9 (L) 25.1 - 34.0 pg   MCHC 30.3 (L) 31.5 - 36.0 g/dL   RBC 5.17 3.70 - 5.45 10e6/uL   RDW 17.2 (H) 11.2 - 14.5 %   lymph# 2.4 0.9 - 3.3 10e3/uL   MONO# 0.9 0.1 - 0.9 10e3/uL   Eosinophils Absolute 0.1 0.0 - 0.5 10e3/uL   Basophils Absolute 0.0 0.0 - 0.1 10e3/uL   NEUT% 57.6 38.4 - 76.8 %   LYMPH% 30.3 14.0 - 49.7 %   MONO% 11.0 0.0 - 14.0 %   EOS% 0.6 0.0 - 7.0 %   BASO% 0.5 0.0 - 2.0 %   nRBC 1 (H) 0 - 0 %   CMET available after visit normal with exception of K 3.4 and alb 3.4, including total bili <0.2 and creat 0.8  Studies/Results:  No results found.  Medications: I have reviewed the patient's current medications. Not on diuretics. Will add hemocyte or ferrous fumarate one daily on empty stomach with OJ  DISCUSSION: discussed oral iron as above. 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 Tolerated cycle 3 well with EMEND and claritin with gCSF. Cycle 4 will be given on 10-26-14 as long as ANC >=1.5 and plt >=100k. She will have gCSF [phone]  and I will see her back the following week with labs 2.hypotension/ orthostatic symptoms resolved with IVF after cycle 1: diuretics held. Continues bystolic for HTN by Dr Charlett Blake 3.PAC in 4.aches from taxol and gCSF. Less with claritin beginning day of chemo and prn tramadol  5.recent right lateral LE pain: exacerbation of previous orthopedic problem, unable to be seen at Dr Nelva Bush' office when acute, but improved with increase in tramadol. Follow 6.asthma intermittent: no symptoms now, continuing usual medications 7.past colon polyps, hx diverticulitis,  GERD 8.flu vaccine done 9.post cholecystectomy 10.minimal peripheral neuropathy primarily right foot: follow 11.hx benign positional vertigo   Chemo and granix orders confirmed. All questions addressed. Time spent 25 min including >50% counseling and coordination of care.  Derricka Mertz P, MD   10/18/2014, 8:44 AM

## 2014-10-19 ENCOUNTER — Other Ambulatory Visit: Payer: Self-pay | Admitting: Oncology

## 2014-10-19 DIAGNOSIS — Z95828 Presence of other vascular implants and grafts: Secondary | ICD-10-CM | POA: Insufficient documentation

## 2014-10-19 DIAGNOSIS — D702 Other drug-induced agranulocytosis: Secondary | ICD-10-CM | POA: Insufficient documentation

## 2014-10-19 DIAGNOSIS — R112 Nausea with vomiting, unspecified: Secondary | ICD-10-CM | POA: Insufficient documentation

## 2014-10-19 DIAGNOSIS — T451X5A Adverse effect of antineoplastic and immunosuppressive drugs, initial encounter: Secondary | ICD-10-CM

## 2014-10-19 DIAGNOSIS — J4520 Mild intermittent asthma, uncomplicated: Secondary | ICD-10-CM | POA: Insufficient documentation

## 2014-10-20 ENCOUNTER — Other Ambulatory Visit: Payer: Self-pay

## 2014-10-20 NOTE — Patient Outreach (Signed)
CSW called patient to get update on application process for veteran benefits. CSW was notified that Patient had started the process and was submitting the request. Patient requested information on local companies that help with home care. CSW provided home care numbers. CSW also checked on medications and history.

## 2014-10-22 ENCOUNTER — Other Ambulatory Visit: Payer: Self-pay | Admitting: *Deleted

## 2014-10-22 ENCOUNTER — Other Ambulatory Visit: Payer: Self-pay

## 2014-10-22 DIAGNOSIS — Z1231 Encounter for screening mammogram for malignant neoplasm of breast: Secondary | ICD-10-CM

## 2014-10-22 NOTE — Patient Outreach (Signed)
[facility]) Care Management   10/22/2014  IZZABELLA BESSE 04-05-1951 [id number]  NATILEE GAUER is an 64 y.o. female  Subjective:   Objective:   Review of Systems  Constitutional: Negative.   HENT: Negative.   Eyes: Negative.   Respiratory: Negative.   Cardiovascular: Negative.   Gastrointestinal: Negative.   Genitourinary: Negative.   Musculoskeletal: Negative.   Skin: Negative.   Neurological: Negative.   Endo/Heme/Allergies: Negative.   Psychiatric/Behavioral: Negative.   All other systems reviewed and are negative.   Physical Exam  Constitutional: She is oriented to person, place, and time. She appears well-developed and well-nourished.  HENT:  Head: Normocephalic.  Neck: Normal range of motion.  Cardiovascular: Normal rate and regular rhythm.   Respiratory: Effort normal and breath sounds normal.  GI: Soft. Bowel sounds are normal.  Musculoskeletal: Normal range of motion.  Neurological: She is alert and oriented to person, place, and time.  Skin: Skin is warm and dry.  Psychiatric: She has a normal mood and affect. Her behavior is normal. Judgment and thought content normal.    Current Medications:   Current Outpatient Prescriptions  Medication Sig Dispense Refill  . albuterol (PROAIR HFA) 108 (90 BASE) MCG/ACT inhaler Inhale 2 puffs into the lungs 2 (two) times daily as needed. (Patient not taking: Reported on 10/18/2014) 3 Inhaler 2  . albuterol (PROVENTIL) (2.5 MG/3ML) 0.083% nebulizer solution Take 3 mLs (2.5 mg total) by nebulization every 6 (six) hours as needed for wheezing or shortness of breath. (Patient not taking: Reported on 10/18/2014) 150 mL 1  . amLODipine (NORVASC) 10 MG tablet Take 1 tablet (10 mg total) by mouth daily. 90 tablet 0  . aspirin 81 MG tablet Take 81 mg by mouth daily.      Marland Kitchen atorvastatin (LIPITOR) 20 MG tablet Take 10 mg by mouth daily.    . benazepril (LOTENSIN) 40 MG tablet Take 1 tablet (40 mg total) by mouth  daily. 90 tablet 0  . budesonide-formoterol (SYMBICORT) 160-4.5 MCG/ACT inhaler Inhale 2 puffs into the lungs 2 (two) times daily. 3 Inhaler 2  . dexamethasone (DECADRON) 4 MG tablet Take 5 tabs with food (=20mg ) 12 hrs prior to chemo and 6 hrs with food prior to chemo 30 tablet 0  . diclofenac sodium (VOLTAREN) 1 % GEL Apply 2 g topically 4 (four) times daily as needed.     . diphenhydramine-acetaminophen (TYLENOL PM) 25-500 MG TABS Take 1 tablet by mouth at bedtime as needed.    . ferrous fumarate (HEMOCYTE) 325 (106 FE) MG TABS tablet Take 1 tablet daily on an empty stomach with OJ. 30 each 4  . fluticasone (FLONASE) 50 MCG/ACT nasal spray Place 2 sprays into the nose daily. 16 g 0  . furosemide (LASIX) 20 MG tablet Take 1 tablet (20 mg total) by mouth daily. 90 tablet 0  . HYDROcodone-homatropine (HYCODAN) 5-1.5 MG/5ML syrup Take 5 mLs by mouth every 8 (eight) hours as needed for cough. (Patient not taking: Reported on 10/18/2014) 120 mL 0  . levothyroxine (SYNTHROID, LEVOTHROID) 25 MCG tablet Take 1 tablet (25 mcg total) by mouth as directed. 1 tab daily except on Saturdays take 2 tabs 102 tablet 0  . lidocaine-prilocaine (EMLA) cream Apply 1-2 hrs to [facility] cath site  prior access as directed. 30 g 1  . LORazepam (ATIVAN) 1 MG tablet Place 1 tablet under the tongue or swallow every 6 hours as needed for nausea.  Will make you drowsy. 20 tablet 0  .  meloxicam (MOBIC) 15 MG tablet Take 15 mg by mouth daily as needed.     . Multiple Vitamin (MULTIVITAMIN) tablet Take 1 tablet by mouth daily.    . nebivolol (BYSTOLIC) 10 MG tablet Take 1 tablet (10 mg total) by mouth 2 (two) times daily. 180 tablet 1  . ondansetron (ZOFRAN) 8 MG tablet Take 1 tablet (8 mg total) by mouth every 8 (eight) hours as needed for nausea or vomiting. Will  not make drowsy. 30 tablet 01  . pantoprazole (PROTONIX) 40 MG tablet Take 1 tablet (40 mg total) by mouth daily. 90 tablet 2  . pentoxifylline (TRENTAL) 400 MG CR tablet  Take 400 mg by mouth 2 (two) times daily.     . Probiotic Product (ALIGN) 4 MG CAPS Take 1 capsule by mouth daily.     . traMADol (ULTRAM) 50 MG tablet Take 1-2 tablets by mouth as needed every 8 hours for pain. 30 tablet 0  . triamcinolone (KENALOG) 0.1 % cream Apply 1 application topically. Apply topically to affected area once daily      No current facility-administered medications for this visit.    Functional Status:   No flowsheet data found.  Fall/Depression Screening:    No flowsheet data found.  Assessment:   Follow up on ongoing swelling related to TED  Ongoing case management related to HTN   Plan:   Verified pt is wearing her compression stocking daily with minimal swelling to her lower extremities. Will continue to encouraged pt to wear her compression stocking and offer an additional pair for ongoing adherence with daily wear.  Again review education on removing the compression stocking at night and reapply upon awakening in the morning when mobile.  Will verified pt's ongoing understanding of HTN and possible crises than can occur. Will also verify pt continues to monitor her BP daily and document all readings for provides to view. Will continue to encourage pt with her daily monitoring and verify pt's understanding of HTN crises.  Plan to continue educating pt on her medical problem with hypo--hypertension and what to do if acute. Will continue to assist pt in managing her ongoing medical issues for improving her self management of care. No inquires or questions presented today. Will follow up with a scheduled home visit for next month.   Raina Mina, RN Care Management Coordinator [facility] Main Office [phone]

## 2014-10-22 NOTE — Patient Instructions (Signed)

## 2014-10-26 ENCOUNTER — Ambulatory Visit (HOSPITAL_BASED_OUTPATIENT_CLINIC_OR_DEPARTMENT_OTHER): Payer: Commercial Managed Care - HMO

## 2014-10-26 ENCOUNTER — Other Ambulatory Visit (HOSPITAL_BASED_OUTPATIENT_CLINIC_OR_DEPARTMENT_OTHER): Payer: Commercial Managed Care - HMO

## 2014-10-26 ENCOUNTER — Ambulatory Visit: Payer: Commercial Managed Care - HMO | Admitting: Family Medicine

## 2014-10-26 DIAGNOSIS — Z5111 Encounter for antineoplastic chemotherapy: Secondary | ICD-10-CM

## 2014-10-26 DIAGNOSIS — C541 Malignant neoplasm of endometrium: Secondary | ICD-10-CM

## 2014-10-26 LAB — CBC WITH DIFFERENTIAL/PLATELET
BASO%: 0 % (ref 0.0–2.0)
Basophils Absolute: 0 10*3/uL (ref 0.0–0.1)
EOS%: 0 % (ref 0.0–7.0)
Eosinophils Absolute: 0 10*3/uL (ref 0.0–0.5)
HCT: 36.4 % (ref 34.8–46.6)
HGB: 11.3 g/dL — ABNORMAL LOW (ref 11.6–15.9)
LYMPH%: 13.5 % — AB (ref 14.0–49.7)
MCH: 21 pg — ABNORMAL LOW (ref 25.1–34.0)
MCHC: 31 g/dL — ABNORMAL LOW (ref 31.5–36.0)
MCV: 67.8 fL — ABNORMAL LOW (ref 79.5–101.0)
MONO#: 0 10*3/uL — AB (ref 0.1–0.9)
MONO%: 0.4 % (ref 0.0–14.0)
NEUT#: 4.6 10*3/uL (ref 1.5–6.5)
NEUT%: 86.1 % — ABNORMAL HIGH (ref 38.4–76.8)
NRBC: 0 % (ref 0–0)
PLATELETS: 241 10*3/uL (ref 145–400)
RBC: 5.37 10*6/uL (ref 3.70–5.45)
RDW: 18 % — ABNORMAL HIGH (ref 11.2–14.5)
WBC: 5.4 10*3/uL (ref 3.9–10.3)
lymph#: 0.7 10*3/uL — ABNORMAL LOW (ref 0.9–3.3)

## 2014-10-26 LAB — COMPREHENSIVE METABOLIC PANEL (CC13)
ALBUMIN: 3.6 g/dL (ref 3.5–5.0)
ALT: 25 U/L (ref 0–55)
AST: 17 U/L (ref 5–34)
Alkaline Phosphatase: 93 U/L (ref 40–150)
Anion Gap: 11 mEq/L (ref 3–11)
BUN: 12.5 mg/dL (ref 7.0–26.0)
CALCIUM: 9.6 mg/dL (ref 8.4–10.4)
CHLORIDE: 107 meq/L (ref 98–109)
CO2: 21 meq/L — AB (ref 22–29)
Creatinine: 0.8 mg/dL (ref 0.6–1.1)
EGFR: 88 mL/min/{1.73_m2} — AB (ref >90)
GLUCOSE: 208 mg/dL — AB (ref 70–140)
Potassium: 4.1 mEq/L (ref 3.5–5.1)
SODIUM: 140 meq/L (ref 136–145)
Total Bilirubin: 0.22 mg/dL (ref 0.20–1.20)
Total Protein: 7.4 g/dL (ref 6.4–8.3)

## 2014-10-26 MED ORDER — DIPHENHYDRAMINE HCL 50 MG/ML IJ SOLN
50.0000 mg | Freq: Once | INTRAMUSCULAR | Status: AC
  Administered 2014-10-26: 50 mg via INTRAVENOUS

## 2014-10-26 MED ORDER — DIPHENHYDRAMINE HCL 50 MG/ML IJ SOLN
INTRAMUSCULAR | Status: AC
  Filled 2014-10-26: qty 1

## 2014-10-26 MED ORDER — SODIUM CHLORIDE 0.9 % IV SOLN
Freq: Once | INTRAVENOUS | Status: AC
  Administered 2014-10-26: 10:00:00 via INTRAVENOUS
  Filled 2014-10-26: qty 8

## 2014-10-26 MED ORDER — HEPARIN SOD (PORK) LOCK FLUSH 100 UNIT/ML IV SOLN
500.0000 [IU] | Freq: Once | INTRAVENOUS | Status: AC | PRN
  Administered 2014-10-26: 500 [IU]
  Filled 2014-10-26: qty 5

## 2014-10-26 MED ORDER — PACLITAXEL CHEMO INJECTION 300 MG/50ML
175.0000 mg/m2 | Freq: Once | INTRAVENOUS | Status: AC
  Administered 2014-10-26: 384 mg via INTRAVENOUS
  Filled 2014-10-26: qty 64

## 2014-10-26 MED ORDER — SODIUM CHLORIDE 0.9 % IV SOLN
750.0000 mg | Freq: Once | INTRAVENOUS | Status: AC
  Administered 2014-10-26: 750 mg via INTRAVENOUS
  Filled 2014-10-26: qty 75

## 2014-10-26 MED ORDER — FAMOTIDINE IN NACL 20-0.9 MG/50ML-% IV SOLN
20.0000 mg | Freq: Once | INTRAVENOUS | Status: AC
  Administered 2014-10-26: 20 mg via INTRAVENOUS

## 2014-10-26 MED ORDER — FAMOTIDINE IN NACL 20-0.9 MG/50ML-% IV SOLN
INTRAVENOUS | Status: AC
  Filled 2014-10-26: qty 50

## 2014-10-26 MED ORDER — SODIUM CHLORIDE 0.9 % IJ SOLN
10.0000 mL | INTRAMUSCULAR | Status: DC | PRN
  Administered 2014-10-26: 10 mL
  Filled 2014-10-26: qty 10

## 2014-10-26 MED ORDER — SODIUM CHLORIDE 0.9 % IV SOLN
Freq: Once | INTRAVENOUS | Status: AC
  Administered 2014-10-26: 10:00:00 via INTRAVENOUS

## 2014-10-26 MED ORDER — SODIUM CHLORIDE 0.9 % IV SOLN
Freq: Once | INTRAVENOUS | Status: AC
  Administered 2014-10-26: 11:00:00 via INTRAVENOUS
  Filled 2014-10-26: qty 5

## 2014-10-26 NOTE — Patient Instructions (Signed)
[facility] Discharge Instructions for Patients Receiving Chemotherapy  Today you received the following chemotherapy agents Taxol Carbo  To help prevent nausea and vomiting after your treatment, we encourage you to take your nausea medication as directed/prescribed   If you develop nausea and vomiting that is not controlled by your nausea medication, call the clinic.   BELOW ARE SYMPTOMS THAT SHOULD BE REPORTED IMMEDIATELY:  *FEVER GREATER THAN 100.5 F  *CHILLS WITH OR WITHOUT FEVER  NAUSEA AND VOMITING THAT IS NOT CONTROLLED WITH YOUR NAUSEA MEDICATION  *UNUSUAL SHORTNESS OF BREATH  *UNUSUAL BRUISING OR BLEEDING  TENDERNESS IN MOUTH AND THROAT WITH OR WITHOUT PRESENCE OF ULCERS  *URINARY PROBLEMS  *BOWEL PROBLEMS  UNUSUAL RASH Items with * indicate a potential emergency and should be followed up as soon as possible.  Feel free to call the clinic you have any questions or concerns. The clinic phone number is [phone].  Please show the CHEMO ALERT CARD at check-in to the Emergency Department and triage nurse.   

## 2014-11-01 ENCOUNTER — Ambulatory Visit (HOSPITAL_BASED_OUTPATIENT_CLINIC_OR_DEPARTMENT_OTHER): Payer: Commercial Managed Care - HMO

## 2014-11-01 DIAGNOSIS — C541 Malignant neoplasm of endometrium: Secondary | ICD-10-CM

## 2014-11-01 DIAGNOSIS — Z5189 Encounter for other specified aftercare: Secondary | ICD-10-CM

## 2014-11-01 MED ORDER — TBO-FILGRASTIM 300 MCG/0.5ML ~~LOC~~ SOSY
300.0000 ug | PREFILLED_SYRINGE | Freq: Once | SUBCUTANEOUS | Status: AC
  Administered 2014-11-01: 300 ug via SUBCUTANEOUS
  Filled 2014-11-01: qty 0.5

## 2014-11-02 ENCOUNTER — Ambulatory Visit (HOSPITAL_BASED_OUTPATIENT_CLINIC_OR_DEPARTMENT_OTHER): Payer: Commercial Managed Care - HMO

## 2014-11-02 DIAGNOSIS — C541 Malignant neoplasm of endometrium: Secondary | ICD-10-CM

## 2014-11-02 DIAGNOSIS — Z5189 Encounter for other specified aftercare: Secondary | ICD-10-CM | POA: Diagnosis not present

## 2014-11-02 MED ORDER — TBO-FILGRASTIM 300 MCG/0.5ML ~~LOC~~ SOSY
300.0000 ug | PREFILLED_SYRINGE | Freq: Once | SUBCUTANEOUS | Status: AC
  Administered 2014-11-02: 300 ug via SUBCUTANEOUS
  Filled 2014-11-02: qty 0.5

## 2014-11-03 ENCOUNTER — Ambulatory Visit (HOSPITAL_BASED_OUTPATIENT_CLINIC_OR_DEPARTMENT_OTHER): Payer: Commercial Managed Care - HMO

## 2014-11-03 VITALS — BP 123/70 | HR 125 | Temp 98.5°F

## 2014-11-03 DIAGNOSIS — C541 Malignant neoplasm of endometrium: Secondary | ICD-10-CM

## 2014-11-03 DIAGNOSIS — Z5189 Encounter for other specified aftercare: Secondary | ICD-10-CM

## 2014-11-03 MED ORDER — TBO-FILGRASTIM 300 MCG/0.5ML ~~LOC~~ SOSY
300.0000 ug | PREFILLED_SYRINGE | Freq: Once | SUBCUTANEOUS | Status: AC
  Administered 2014-11-03: 300 ug via SUBCUTANEOUS
  Filled 2014-11-03: qty 0.5

## 2014-11-04 ENCOUNTER — Other Ambulatory Visit: Payer: Self-pay

## 2014-11-04 NOTE — Patient Outreach (Signed)
[facility]) Care Management  11/04/2014  Katelyn Fisher [birth year] [id number]   CSW called patient to get update on care plan. Patient reports she was able to make contact with potential home care provider whom she will interview on 4/18. She reports she will confirm whether home care from provider will be acceptable. She reports she has taken next step in aid and attendance application and gotten doctor to fill out a request form. She reports she will keep CSW updated on application process. CSW made plans to follow-up in two weeks to confirm continued progress towards goals and assess needs.

## 2014-11-05 ENCOUNTER — Other Ambulatory Visit: Payer: Self-pay

## 2014-11-05 ENCOUNTER — Encounter: Payer: Self-pay | Admitting: Oncology

## 2014-11-05 DIAGNOSIS — C541 Malignant neoplasm of endometrium: Secondary | ICD-10-CM

## 2014-11-05 NOTE — Progress Notes (Signed)
I placed the forms on the desk of nurse for dr. Marko Plume

## 2014-11-07 ENCOUNTER — Other Ambulatory Visit: Payer: Self-pay | Admitting: Oncology

## 2014-11-07 DIAGNOSIS — C541 Malignant neoplasm of endometrium: Secondary | ICD-10-CM

## 2014-11-08 ENCOUNTER — Encounter: Payer: Self-pay | Admitting: Oncology

## 2014-11-08 ENCOUNTER — Telehealth: Payer: Self-pay | Admitting: Oncology

## 2014-11-08 ENCOUNTER — Telehealth: Payer: Self-pay | Admitting: *Deleted

## 2014-11-08 ENCOUNTER — Ambulatory Visit (HOSPITAL_BASED_OUTPATIENT_CLINIC_OR_DEPARTMENT_OTHER): Payer: Commercial Managed Care - HMO | Admitting: Oncology

## 2014-11-08 ENCOUNTER — Other Ambulatory Visit (HOSPITAL_BASED_OUTPATIENT_CLINIC_OR_DEPARTMENT_OTHER): Payer: Commercial Managed Care - HMO

## 2014-11-08 VITALS — BP 132/89 | HR 96 | Temp 98.2°F | Resp 19 | Ht 62.0 in | Wt 237.9 lb

## 2014-11-08 DIAGNOSIS — I10 Essential (primary) hypertension: Secondary | ICD-10-CM

## 2014-11-08 DIAGNOSIS — R112 Nausea with vomiting, unspecified: Secondary | ICD-10-CM

## 2014-11-08 DIAGNOSIS — Z95828 Presence of other vascular implants and grafts: Secondary | ICD-10-CM

## 2014-11-08 DIAGNOSIS — T451X5A Adverse effect of antineoplastic and immunosuppressive drugs, initial encounter: Secondary | ICD-10-CM

## 2014-11-08 DIAGNOSIS — G622 Polyneuropathy due to other toxic agents: Secondary | ICD-10-CM | POA: Diagnosis not present

## 2014-11-08 DIAGNOSIS — D702 Other drug-induced agranulocytosis: Secondary | ICD-10-CM | POA: Diagnosis not present

## 2014-11-08 DIAGNOSIS — C541 Malignant neoplasm of endometrium: Secondary | ICD-10-CM | POA: Diagnosis not present

## 2014-11-08 DIAGNOSIS — Z6841 Body Mass Index (BMI) 40.0 and over, adult: Secondary | ICD-10-CM

## 2014-11-08 DIAGNOSIS — G620 Drug-induced polyneuropathy: Secondary | ICD-10-CM

## 2014-11-08 LAB — CBC WITH DIFFERENTIAL/PLATELET
BASO%: 0.6 % (ref 0.0–2.0)
BASOS ABS: 0 10*3/uL (ref 0.0–0.1)
EOS ABS: 0.1 10*3/uL (ref 0.0–0.5)
EOS%: 1.1 % (ref 0.0–7.0)
HEMATOCRIT: 35.9 % (ref 34.8–46.6)
HGB: 10.8 g/dL — ABNORMAL LOW (ref 11.6–15.9)
LYMPH%: 33.9 % (ref 14.0–49.7)
MCH: 20.5 pg — ABNORMAL LOW (ref 25.1–34.0)
MCHC: 30.1 g/dL — ABNORMAL LOW (ref 31.5–36.0)
MCV: 68.2 fL — AB (ref 79.5–101.0)
MONO#: 0.8 10*3/uL (ref 0.1–0.9)
MONO%: 13.5 % (ref 0.0–14.0)
NEUT#: 3 10*3/uL (ref 1.5–6.5)
NEUT%: 50.9 % (ref 38.4–76.8)
PLATELETS: 187 10*3/uL (ref 145–400)
RBC: 5.27 10*6/uL (ref 3.70–5.45)
RDW: 19 % — AB (ref 11.2–14.5)
WBC: 5.9 10*3/uL (ref 3.9–10.3)
lymph#: 2 10*3/uL (ref 0.9–3.3)

## 2014-11-08 LAB — COMPREHENSIVE METABOLIC PANEL (CC13)
ALT: 27 U/L (ref 0–55)
ANION GAP: 14 meq/L — AB (ref 3–11)
AST: 19 U/L (ref 5–34)
Albumin: 3.6 g/dL (ref 3.5–5.0)
Alkaline Phosphatase: 89 U/L (ref 40–150)
BUN: 9.3 mg/dL (ref 7.0–26.0)
CHLORIDE: 107 meq/L (ref 98–109)
CO2: 25 mEq/L (ref 22–29)
Calcium: 9.5 mg/dL (ref 8.4–10.4)
Creatinine: 0.8 mg/dL (ref 0.6–1.1)
EGFR: >90 mL/min/{1.73_m2} (ref >90)
Glucose: 114 mg/dl (ref 70–140)
Potassium: 3.8 mEq/L (ref 3.5–5.1)
SODIUM: 146 meq/L — AB (ref 136–145)
Total Protein: 7.1 g/dL (ref 6.4–8.3)

## 2014-11-08 NOTE — Telephone Encounter (Signed)
per pof ot sch pt appt-gave pt copy of sch °

## 2014-11-08 NOTE — Progress Notes (Signed)
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 Plan is vaginal brachytherapy by Dr Isidore Moos after completion of chemotherapy. She is to see Dr Skeet Latch on 11-11-14.  Patient is tolerating chemotherapy well overall, particularly since addition of EMEND and Claritin. She is tired for a few days after chemo, with decreased appetite, but no nausea or vomiting. Bowels are moving regularly. She has minimal tingling in toes, none in fingers. She has no aching with taxol or granix on present schedule with Claritin. She is feeling very well again now, with good appetite and energy.   PAC in Flu vaccine done Genetics referral placed now  Oncologic History  Patient presented to Dr Dellis Filbert with new onset postmenopausal spotting. [place] 05-07-14 reportedly had thickened endometrial stripe, with 3 fibroids largest 5x 5x 3.5 cm; endometrial biopsy 05-07-14 reportedly was concerning for at least FIGO grade 1 endometrial carcinoma. She had consultation with Dr Denman George on 05-20-14. Surgery was robotic hysterectomy, BSO, bilateral pelvic and para-aortic nodes by Dr Skeet Latch at [facility] on 06-23-14. Patient did well with surgery, discharged home on POD #1. Pathology [phone]) found mixed high grade carcinoma (serous 50% and endometrioid 50%) FIGO grade 3, involving inner half of myometrium with depth 5 mm where wall 3 cm thick, no serosal or lower uterine segment involvement, no cervical or adnexal involvement, LVSI present, 0/15 nodes involved. PET/ CT at [facility] 07-30-14 had no findings of concern for distant disease.  [facility] multidisciplinary conference  recommended  6 cycles of taxane platinum chemotherapy followed by vaginal brachytherapy, and for genetics counseling.  Cycle 1 carboplatin taxol was given 2-08-24-14, with neutropenia day 10, granix added. Initial severe nausea controlled with addition of EMEND and aches controlled with claritin.     Review of systems as above, also: No abdominal or pelvic pain. No respiratory symptoms. Minimal LE swelling, better with knee high support socks, no pain. No bleeding. Bladder ok. No problems with PAC Remainder of 10 point Review of Systems negative.  Objective:  Vital signs in last 24 hours:  BP 132/89 mmHg  Pulse 96  Temp(Src) 98.2 F (36.8 C) (Oral)  Resp 19  Ht 5\' 2"  (1.575 m)  Wt 237 lb 14.4 oz (107.911 kg)  BMI 43.50 kg/m2  SpO2 100% Weight down 3 lbs. Alert, oriented and appropriate. Ambulatory without difficulty. Very pleasant as always Alopecia  HEENT:PERRL, sclerae not icteric. Oral mucosa moist without lesions, posterior pharynx clear.  Neck supple. No JVD.  Lymphatics:no cervical,supraclavicular or inguinal adenopathy Resp: clear to auscultation bilaterally and normal percussion bilaterally Cardio: regular rate and rhythm. No gallop. GI: abdomen obese, soft, nontender, not distended, no appreciable mass or organomegaly. Normally active bowel sounds. Surgical incision not remarkable. Musculoskeletal/ Extremities: without pitting edema, cords, tenderness Neuro: no significant peripheral neuropathy. Otherwise nonfocal. PSYCH appropriate mood and affect Skin without rash, ecchymosis, petechiae Portacath-without erythema or tenderness  Lab Results:  Results for orders placed or performed in visit on 11/08/14  CBC with Differential  Result Value Ref Range   WBC 5.9 3.9 - 10.3 10e3/uL   NEUT# 3.0 1.5 - 6.5 10e3/uL   HGB 10.8 (L) 11.6 - 15.9 g/dL   HCT 35.9 34.8 -  46.6 %   Platelets 187 145 - 400 10e3/uL   MCV 68.2 (L) 79.5 - 101.0 fL   MCH 20.5 (L) 25.1 - 34.0 pg    MCHC 30.1 (L) 31.5 - 36.0 g/dL   RBC 5.27 3.70 - 5.45 10e6/uL   RDW 19.0 (H) 11.2 - 14.5 %   lymph# 2.0 0.9 - 3.3 10e3/uL   MONO# 0.8 0.1 - 0.9 10e3/uL   Eosinophils Absolute 0.1 0.0 - 0.5 10e3/uL   Basophils Absolute 0.0 0.0 - 0.1 10e3/uL   NEUT% 50.9 38.4 - 76.8 %   LYMPH% 33.9 14.0 - 49.7 %   MONO% 13.5 0.0 - 14.0 %   EOS% 1.1 0.0 - 7.0 %   BASO% 0.6 0.0 - 2.0 %   CMET available after visit normal with exception of Na 146, including creat 0.8  Studies/Results:  No results found.  Medications: I have reviewed the patient's current medications, continuing EMEND and Claritin  DISCUSSION: patient is pleased with how she is doing with chemo now and is in agreement with continuing as planned. We have discussed rationale for genetics testing, particularly as identification of abnormalities can make a difference in long term surveillance recommendations for patients and family members.  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 Tolerated cycle 3 well with EMEND and claritin with gCSF. Cycle 5 will be given on 11-16-14 as long as ANC >=1.5 and plt >=100k. She will have gCSF 5-2,3,4 and I will see her back with labs prior to cycle 6. Genetics referral. Keep appointment with Dr Skeet Latch 11-11-14 as scheduled. HDR to follow chemo, message sent to Dr Isidore Moos re anticipated date of completion of chemo. 2.HTN managed by Dr Charlett Blake. Diuretics held after first chemo due to dehydration. 3.PAC in 4.aches from taxol and gCSF. controlled with claritin beginning day of chemo and prn tramadol  5. right lateral LE pain: exacerbation of previous orthopedic problem,  improved with prn tramadol. 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 Chemo and granix orders completed. Time spent 25 min including >50% counseling and coordination of care.   LIVESAY,LENNIS P, MD   11/08/2014, 9:36 AM

## 2014-11-08 NOTE — Telephone Encounter (Signed)
Per staff message and POF I have scheduled appts. Advised scheduler of appts. JMW  

## 2014-11-09 ENCOUNTER — Ambulatory Visit
Admission: RE | Admit: 2014-11-09 | Discharge: 2014-11-09 | Disposition: A | Discharge status: Home / Self Care | Payer: Commercial Managed Care - HMO | Source: Ambulatory Visit

## 2014-11-09 ENCOUNTER — Other Ambulatory Visit: Payer: Self-pay | Admitting: Oncology

## 2014-11-09 DIAGNOSIS — Z6841 Body Mass Index (BMI) 40.0 and over, adult: Secondary | ICD-10-CM

## 2014-11-09 DIAGNOSIS — Z1231 Encounter for screening mammogram for malignant neoplasm of breast: Secondary | ICD-10-CM

## 2014-11-09 HISTORY — DX: Morbid (severe) obesity due to excess calories: E66.01

## 2014-11-09 LAB — CA 125: CA 125: 13 U/mL (ref <35)

## 2014-11-11 ENCOUNTER — Encounter: Payer: Self-pay | Admitting: Gynecologic Oncology

## 2014-11-11 ENCOUNTER — Ambulatory Visit: Payer: Commercial Managed Care - HMO | Attending: Gynecologic Oncology | Admitting: Gynecologic Oncology

## 2014-11-11 VITALS — BP 128/87 | HR 85 | Temp 98.2°F | Resp 18 | Ht 62.0 in | Wt 240.5 lb

## 2014-11-11 DIAGNOSIS — I10 Essential (primary) hypertension: Secondary | ICD-10-CM | POA: Insufficient documentation

## 2014-11-11 DIAGNOSIS — M1990 Unspecified osteoarthritis, unspecified site: Secondary | ICD-10-CM | POA: Diagnosis not present

## 2014-11-11 DIAGNOSIS — H8110 Benign paroxysmal vertigo, unspecified ear: Secondary | ICD-10-CM | POA: Diagnosis not present

## 2014-11-11 DIAGNOSIS — E079 Disorder of thyroid, unspecified: Secondary | ICD-10-CM | POA: Diagnosis not present

## 2014-11-11 DIAGNOSIS — J45909 Unspecified asthma, uncomplicated: Secondary | ICD-10-CM | POA: Diagnosis not present

## 2014-11-11 DIAGNOSIS — K219 Gastro-esophageal reflux disease without esophagitis: Secondary | ICD-10-CM | POA: Diagnosis not present

## 2014-11-11 DIAGNOSIS — C541 Malignant neoplasm of endometrium: Secondary | ICD-10-CM | POA: Diagnosis not present

## 2014-11-11 DIAGNOSIS — Z6841 Body Mass Index (BMI) 40.0 and over, adult: Secondary | ICD-10-CM | POA: Diagnosis not present

## 2014-11-11 DIAGNOSIS — F329 Major depressive disorder, single episode, unspecified: Secondary | ICD-10-CM | POA: Diagnosis not present

## 2014-11-11 NOTE — Patient Instructions (Signed)
Plan to follow up in four months or sooner if needed.

## 2014-11-11 NOTE — Progress Notes (Signed)
GYNECOLOGIC ONCOLOGY OFFICE NOTE   Chief Complaint: Uterine serous endometrial cancer.    Assessment/Plan: Katelyn Fisher is a 64 y.o. woman with pre op diagnosis of grade 1 endometrial cancer . 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   PET/CT scan of the abdomen and pelvis to evaluate the para-aortic LN is without evidence of metastases Has received 4/6 6 cycles adjuvant platinum and taxane chemotherapy.  This will be  followed by vaginal brachytherapy. Follow-up with Gyn Onc in 4 months Follow-up with Dr Marko Plume and Rad Onc at [facility] as scheduled   Referral to genetic counseling.   History of Present Illness: Katelyn Fisher is a 64 y.o. woman who presented with a two-week history of postmenopausal bleeding. A pelvic ultrasound showed fibroids and a thickened endometrial stripe. She subsequently underwent an endometrial biopsy which was concerning for at least grade 1 endometrioid adenocarcinoma.   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 US showed fibroids and thickened EMS. EMB showed "at least grade 1 endometriod adenoca" on EMB by primary gynecologist.    06/23/2014  Surgery  RATLH, BSO, BPLND. Findings: enlarged fibroid uterus, no e/o mets. IOFS: grade 1, focal LVSI. Final path: mixed serous (50%) and endometrioid (50%), 2.0cm size, 17% invasion, no cervical involvement, +LVSI, all pelvic LN neg.     Pathology: A: Uterus, cervix, bilateral ovaries and fallopian tubes, total hysterectomy and bilateral salpingo-oophorectomy  Histologic type: mixed high grade carcinoma (serous carcinoma 50%, endometrioid adenocarcinoma 50%)   Histologic grade: FIGO grade 3   Tumor site: posterior upper corpus   Tumor size (gross): 2.0 cm (microscopic  measurement)   Myometrial invasion: Inner half Depth: 5 mm Wall thickness: 3 cm Percent: 17%  Serosal involvement: not identified   Lower uterine segment involvement: not identified   Cervical involvement: not identified   Adnexal involvement: not identified   Other involved sites: not applicable   Cervical/vaginal margin and distance: widely negative   Lymphovascular space invasion: present  Regional lymph nodes (see other specimens): Total number involved: 0  Total number examined: 15  Additional pathologic findings:  Right ovary: atrophic with scant adhesions  Left ovary: atrophic with endosalpingiosis Endometrium: 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) predominantly background atrophic endometrium Cervix: atrophic, Nabothian cysts  Myometrium: adenomyosis, submucosal and intramural leiomyomata (size up to 5.5 cm), small hemangioma  Right fallopian tube: mild chronic inflammation and scant adhesions; s/p ligation by gross examination  Left fallopian tube: mild chronic inflammation and scant adhesions; s/p ligation by gross examination  AJCC Pathologic Stage: pT1a pN0 pMx B: Lymph nodes, right pelvic, excision  - Six lymph nodes negative for metastatic carcinoma (0/6)   C: Lymph nodes, left pelvic, excision  - Nine lymph nodes, negative for metastatic carcinoma (0/9)   Post op PET to assess LN without evidence of metastatic disease.  Has received 4 cycles taxol/Carbo.  Past Medical History:  Past Medical History   Past Medical History   Diagnosis  Date   .  Asthma    .  Morbid obesity (RAF-HCC)      BMI 44   .  Hypertension    .  GERD (gastroesophageal reflux disease)    .  Arthritis    .  Depression    .  Back  pain    .  Thyroid disease  05/2013   .  Vertigo  10/2012     benign paroxysmal positional       Past Surgical History:  Past Surgical History   Past Surgical History   Procedure  Laterality  Date   .  Dilation  and curettage of uterus   2102     for PMB   .  Hysteroscopy   2012     with resection of fibroids   .  Tubal ligation     .  Laparoscopic cholecystectomy     .  Pr laparoscopy w tot hysterectuterus <=250 gram w tube/ovary  Midline  06/23/2014     Procedure: ROBOTIC LAPAROSCOPY, SURGICAL, WITH TOTAL HYSTERECTOMY, FOR UTERUS 250 G OR LESS; W/REMOVAL TUBE(S) AND/OR OVARY(S); Surgeon: Devra Dopp, MD; Location: MAIN OR [facility]; Service: Gynecology Oncology   .  Pr lap,pelvic lymphadenectomy/bx  Bilateral  06/23/2014     Procedure: ROBOTIC LAPAROSCOPY, SURGICAL; WITH BILATERAL TOTAL PELVIC LYMPHADENECTOMY PERI-AORTIC LYMPH NODE SAMPLE, SGL/MULT; Surgeon: Devra Dopp, MD; Location: MAIN OR [facility]; Service: Gynecology Oncology       Family History:  Family History   Family History   Problem  Relation  Age of Onset   .  Heart disease  Maternal Aunt    .  Lung cancer  Father    .  Cancer  Maternal Aunt  74     Breast   .  Cancer  Maternal Aunt  67     Breast   .  Diabetes  Mother    .  Lupus  Mother    .  Diabetes  Father    .  Prostate cancer  Father    .  Diabetes  Brother    .  Heart disease  Brother      ROS: No visual changes, no nausea or vomiting, no sensory or motor changes, no flank pain, no extremity edema, no incontinence, no vaginal bleeding. Tolerating chemo well.    Physical Exam: BP 128/87 mmHg  Pulse 85  Temp(Src) 98.2 F (36.8 C) (Oral)  Resp 18  Ht 5\' 2"  (1.575 m)  Wt 240 lb 8 oz (109.09 kg)  BMI 43.98 kg/m2 WD female in NAD Chest: CTA Back: No CVAT Abdomen: Obese. Soft, nondistended, nontender to palpation. No masses or hepatosplenomegaly appreciated.Port sites intact, no tenderness or masses  GU: Normal external female genitalia. No lesions. Cuff intact, no bleeding or discharge Back:  No CVAT LN:  No cervical supraclavicular or inguinal adenopathy Ext No CCE

## 2014-11-16 ENCOUNTER — Other Ambulatory Visit (HOSPITAL_BASED_OUTPATIENT_CLINIC_OR_DEPARTMENT_OTHER): Payer: Commercial Managed Care - HMO

## 2014-11-16 ENCOUNTER — Ambulatory Visit (HOSPITAL_BASED_OUTPATIENT_CLINIC_OR_DEPARTMENT_OTHER): Payer: Commercial Managed Care - HMO

## 2014-11-16 VITALS — BP 130/84 | HR 101 | Temp 98.0°F | Resp 18

## 2014-11-16 DIAGNOSIS — C541 Malignant neoplasm of endometrium: Secondary | ICD-10-CM

## 2014-11-16 DIAGNOSIS — Z5111 Encounter for antineoplastic chemotherapy: Secondary | ICD-10-CM | POA: Diagnosis not present

## 2014-11-16 LAB — CBC WITH DIFFERENTIAL/PLATELET
BASO%: 0 % (ref 0.0–2.0)
Basophils Absolute: 0 10*3/uL (ref 0.0–0.1)
EOS%: 0 % (ref 0.0–7.0)
Eosinophils Absolute: 0 10*3/uL (ref 0.0–0.5)
HEMATOCRIT: 36.5 % (ref 34.8–46.6)
HGB: 11.3 g/dL — ABNORMAL LOW (ref 11.6–15.9)
LYMPH#: 1 10*3/uL (ref 0.9–3.3)
LYMPH%: 23 % (ref 14.0–49.7)
MCH: 21.4 pg — ABNORMAL LOW (ref 25.1–34.0)
MCHC: 31 g/dL — ABNORMAL LOW (ref 31.5–36.0)
MCV: 69.1 fL — ABNORMAL LOW (ref 79.5–101.0)
MONO#: 0 10*3/uL — ABNORMAL LOW (ref 0.1–0.9)
MONO%: 0.2 % (ref 0.0–14.0)
NEUT#: 3.2 10*3/uL (ref 1.5–6.5)
NEUT%: 76.8 % (ref 38.4–76.8)
Platelets: 234 10*3/uL (ref 145–400)
RBC: 5.28 10*6/uL (ref 3.70–5.45)
RDW: 19.2 % — ABNORMAL HIGH (ref 11.2–14.5)
WBC: 4.2 10*3/uL (ref 3.9–10.3)
nRBC: 0 % (ref 0–0)

## 2014-11-16 LAB — COMPREHENSIVE METABOLIC PANEL (CC13)
ALT: 24 U/L (ref 0–55)
AST: 16 U/L (ref 5–34)
Albumin: 3.7 g/dL (ref 3.5–5.0)
Alkaline Phosphatase: 87 U/L (ref 40–150)
Anion Gap: 15 mEq/L — ABNORMAL HIGH (ref 3–11)
BUN: 13.2 mg/dL (ref 7.0–26.0)
CALCIUM: 10 mg/dL (ref 8.4–10.4)
CO2: 19 mEq/L — ABNORMAL LOW (ref 22–29)
Chloride: 107 mEq/L (ref 98–109)
Creatinine: 0.8 mg/dL (ref 0.6–1.1)
EGFR: 86 mL/min/{1.73_m2} — ABNORMAL LOW (ref >90)
Glucose: 209 mg/dl — ABNORMAL HIGH (ref 70–140)
Potassium: 4.1 mEq/L (ref 3.5–5.1)
SODIUM: 140 meq/L (ref 136–145)
Total Bilirubin: <0.2 mg/dL (ref 0.20–1.20)
Total Protein: 7.4 g/dL (ref 6.4–8.3)

## 2014-11-16 MED ORDER — SODIUM CHLORIDE 0.9 % IV SOLN
Freq: Once | INTRAVENOUS | Status: AC
  Administered 2014-11-16: 09:00:00 via INTRAVENOUS
  Filled 2014-11-16: qty 8

## 2014-11-16 MED ORDER — SODIUM CHLORIDE 0.9 % IV SOLN
750.0000 mg | Freq: Once | INTRAVENOUS | Status: AC
  Administered 2014-11-16: 750 mg via INTRAVENOUS
  Filled 2014-11-16: qty 75

## 2014-11-16 MED ORDER — FAMOTIDINE IN NACL 20-0.9 MG/50ML-% IV SOLN
20.0000 mg | Freq: Once | INTRAVENOUS | Status: AC
  Administered 2014-11-16: 20 mg via INTRAVENOUS

## 2014-11-16 MED ORDER — SODIUM CHLORIDE 0.9 % IV SOLN
Freq: Once | INTRAVENOUS | Status: AC
  Administered 2014-11-16: 09:00:00 via INTRAVENOUS

## 2014-11-16 MED ORDER — SODIUM CHLORIDE 0.9 % IJ SOLN
10.0000 mL | INTRAMUSCULAR | Status: DC | PRN
  Administered 2014-11-16: 10 mL
  Filled 2014-11-16: qty 10

## 2014-11-16 MED ORDER — DIPHENHYDRAMINE HCL 50 MG/ML IJ SOLN
INTRAMUSCULAR | Status: AC
  Filled 2014-11-16: qty 1

## 2014-11-16 MED ORDER — FAMOTIDINE IN NACL 20-0.9 MG/50ML-% IV SOLN
INTRAVENOUS | Status: AC
  Filled 2014-11-16: qty 50

## 2014-11-16 MED ORDER — FOSAPREPITANT DIMEGLUMINE INJECTION 150 MG
Freq: Once | INTRAVENOUS | Status: AC
  Administered 2014-11-16: 10:00:00 via INTRAVENOUS
  Filled 2014-11-16: qty 5

## 2014-11-16 MED ORDER — DIPHENHYDRAMINE HCL 50 MG/ML IJ SOLN
50.0000 mg | Freq: Once | INTRAMUSCULAR | Status: AC
  Administered 2014-11-16: 50 mg via INTRAVENOUS

## 2014-11-16 MED ORDER — PACLITAXEL CHEMO INJECTION 300 MG/50ML
175.0000 mg/m2 | Freq: Once | INTRAVENOUS | Status: AC
  Administered 2014-11-16: 384 mg via INTRAVENOUS
  Filled 2014-11-16: qty 64

## 2014-11-16 MED ORDER — HEPARIN SOD (PORK) LOCK FLUSH 100 UNIT/ML IV SOLN
500.0000 [IU] | Freq: Once | INTRAVENOUS | Status: AC | PRN
  Administered 2014-11-16: 500 [IU]
  Filled 2014-11-16: qty 5

## 2014-11-16 NOTE — Patient Instructions (Signed)
[facility] Discharge Instructions for Patients Receiving Chemotherapy  Today you received the following chemotherapy agents Taxol and Carboplatin.  To help prevent nausea and vomiting after your treatment, we encourage you to take your nausea medication as prescribed.   If you develop nausea and vomiting that is not controlled by your nausea medication, call the clinic.   BELOW ARE SYMPTOMS THAT SHOULD BE REPORTED IMMEDIATELY:  *FEVER GREATER THAN 100.5 F  *CHILLS WITH OR WITHOUT FEVER  NAUSEA AND VOMITING THAT IS NOT CONTROLLED WITH YOUR NAUSEA MEDICATION  *UNUSUAL SHORTNESS OF BREATH  *UNUSUAL BRUISING OR BLEEDING  TENDERNESS IN MOUTH AND THROAT WITH OR WITHOUT PRESENCE OF ULCERS  *URINARY PROBLEMS  *BOWEL PROBLEMS  UNUSUAL RASH Items with * indicate a potential emergency and should be followed up as soon as possible.  Feel free to call the clinic you have any questions or concerns. The clinic phone number is [phone].  Please show the CHEMO ALERT CARD at check-in to the Emergency Department and triage nurse.   

## 2014-11-18 ENCOUNTER — Other Ambulatory Visit: Payer: Commercial Managed Care - HMO

## 2014-11-18 ENCOUNTER — Ambulatory Visit: Payer: Commercial Managed Care - HMO

## 2014-11-19 ENCOUNTER — Ambulatory Visit (INDEPENDENT_AMBULATORY_CARE_PROVIDER_SITE_OTHER): Payer: Commercial Managed Care - HMO | Admitting: Family Medicine

## 2014-11-19 ENCOUNTER — Encounter: Payer: Self-pay | Admitting: Family Medicine

## 2014-11-19 VITALS — BP 154/97 | HR 88 | Temp 98.5°F | Ht 62.5 in | Wt 238.4 lb

## 2014-11-19 DIAGNOSIS — Z9109 Other allergy status, other than to drugs and biological substances: Secondary | ICD-10-CM

## 2014-11-19 DIAGNOSIS — K219 Gastro-esophageal reflux disease without esophagitis: Secondary | ICD-10-CM

## 2014-11-19 DIAGNOSIS — E785 Hyperlipidemia, unspecified: Secondary | ICD-10-CM | POA: Diagnosis not present

## 2014-11-19 DIAGNOSIS — E119 Type 2 diabetes mellitus without complications: Secondary | ICD-10-CM

## 2014-11-19 DIAGNOSIS — C541 Malignant neoplasm of endometrium: Secondary | ICD-10-CM

## 2014-11-19 DIAGNOSIS — R739 Hyperglycemia, unspecified: Secondary | ICD-10-CM

## 2014-11-19 DIAGNOSIS — M25569 Pain in unspecified knee: Secondary | ICD-10-CM

## 2014-11-19 DIAGNOSIS — J45909 Unspecified asthma, uncomplicated: Secondary | ICD-10-CM | POA: Diagnosis not present

## 2014-11-19 DIAGNOSIS — G8929 Other chronic pain: Secondary | ICD-10-CM

## 2014-11-19 DIAGNOSIS — Z889 Allergy status to unspecified drugs, medicaments and biological substances status: Secondary | ICD-10-CM

## 2014-11-19 DIAGNOSIS — I10 Essential (primary) hypertension: Secondary | ICD-10-CM

## 2014-11-19 LAB — LDL CHOLESTEROL, DIRECT: Direct LDL: 71 mg/dL

## 2014-11-19 LAB — LIPID PANEL
CHOL/HDL RATIO: 4
CHOLESTEROL: 174 mg/dL (ref 0–200)
HDL: 49.5 mg/dL (ref >39.00)
NonHDL: 124.5
Triglycerides: 282 mg/dL — ABNORMAL HIGH (ref 0.0–149.0)
VLDL: 56.4 mg/dL — AB (ref 0.0–40.0)

## 2014-11-19 LAB — HEMOGLOBIN A1C: Hgb A1c MFr Bld: 7 % — ABNORMAL HIGH (ref 4.6–6.5)

## 2014-11-19 MED ORDER — BENAZEPRIL HCL 40 MG PO TABS
40.0000 mg | ORAL_TABLET | Freq: Every day | ORAL | Status: DC
Start: 2014-11-19 — End: 2015-10-05

## 2014-11-19 MED ORDER — LEVOTHYROXINE SODIUM 25 MCG PO TABS: 25.0000 ug | ORAL_TABLET | ORAL | Status: DC

## 2014-11-19 MED ORDER — NEBIVOLOL HCL 10 MG PO TABS: 10.0000 mg | ORAL_TABLET | Freq: Two times a day (BID) | ORAL | Status: DC

## 2014-11-19 MED ORDER — AMLODIPINE BESYLATE 10 MG PO TABS
10.0000 mg | ORAL_TABLET | Freq: Every day | ORAL | Status: DC
Start: 2014-11-19 — End: 2015-10-05

## 2014-11-19 NOTE — Patient Instructions (Signed)

## 2014-11-19 NOTE — Progress Notes (Signed)
Pre visit review using our clinic review tool, if applicable. No additional management support is needed unless otherwise documented below in the visit note. 

## 2014-11-22 ENCOUNTER — Ambulatory Visit (HOSPITAL_BASED_OUTPATIENT_CLINIC_OR_DEPARTMENT_OTHER): Payer: Commercial Managed Care - HMO

## 2014-11-22 VITALS — BP 119/86 | HR 120 | Temp 98.4°F | Resp 18

## 2014-11-22 DIAGNOSIS — C541 Malignant neoplasm of endometrium: Secondary | ICD-10-CM

## 2014-11-22 DIAGNOSIS — D702 Other drug-induced agranulocytosis: Secondary | ICD-10-CM

## 2014-11-22 MED ORDER — TBO-FILGRASTIM 300 MCG/0.5ML ~~LOC~~ SOSY
300.0000 ug | PREFILLED_SYRINGE | Freq: Once | SUBCUTANEOUS | Status: AC
  Administered 2014-11-22: 300 ug via SUBCUTANEOUS
  Filled 2014-11-22: qty 0.5

## 2014-11-22 NOTE — Patient Instructions (Signed)

## 2014-11-23 ENCOUNTER — Ambulatory Visit (HOSPITAL_BASED_OUTPATIENT_CLINIC_OR_DEPARTMENT_OTHER): Payer: Commercial Managed Care - HMO

## 2014-11-23 VITALS — BP 106/71 | HR 106 | Temp 98.6°F | Resp 18

## 2014-11-23 DIAGNOSIS — D702 Other drug-induced agranulocytosis: Secondary | ICD-10-CM | POA: Diagnosis not present

## 2014-11-23 DIAGNOSIS — C541 Malignant neoplasm of endometrium: Secondary | ICD-10-CM

## 2014-11-23 MED ORDER — TBO-FILGRASTIM 300 MCG/0.5ML ~~LOC~~ SOSY
300.0000 ug | PREFILLED_SYRINGE | Freq: Once | SUBCUTANEOUS | Status: AC
  Administered 2014-11-23: 300 ug via SUBCUTANEOUS
  Filled 2014-11-23: qty 0.5

## 2014-11-23 NOTE — Patient Instructions (Signed)

## 2014-11-24 ENCOUNTER — Ambulatory Visit (HOSPITAL_BASED_OUTPATIENT_CLINIC_OR_DEPARTMENT_OTHER): Payer: Commercial Managed Care - HMO

## 2014-11-24 VITALS — BP 107/93 | HR 106 | Temp 98.7°F

## 2014-11-24 DIAGNOSIS — D702 Other drug-induced agranulocytosis: Secondary | ICD-10-CM

## 2014-11-24 DIAGNOSIS — C541 Malignant neoplasm of endometrium: Secondary | ICD-10-CM | POA: Diagnosis not present

## 2014-11-24 MED ORDER — TBO-FILGRASTIM 300 MCG/0.5ML ~~LOC~~ SOSY
300.0000 ug | PREFILLED_SYRINGE | Freq: Once | SUBCUTANEOUS | Status: AC
  Administered 2014-11-24: 300 ug via SUBCUTANEOUS
  Filled 2014-11-24: qty 0.5

## 2014-11-24 NOTE — Patient Instructions (Signed)

## 2014-11-25 ENCOUNTER — Other Ambulatory Visit: Payer: Self-pay | Admitting: *Deleted

## 2014-11-25 ENCOUNTER — Encounter: Payer: Self-pay | Admitting: *Deleted

## 2014-11-25 NOTE — Patient Outreach (Signed)
[facility]) Care Management  [facility] Social Work  11/25/2014  Katelyn Fisher 01-16-1951 [id number]  Subjective:    Objective:   Current Medications:  Current Outpatient Prescriptions  Medication Sig Dispense Refill  . albuterol (PROAIR HFA) 108 (90 BASE) MCG/ACT inhaler Inhale 2 puffs into the lungs 2 (two) times daily as needed. 3 Inhaler 2  . albuterol (PROVENTIL) (2.5 MG/3ML) 0.083% nebulizer solution Take 3 mLs (2.5 mg total) by nebulization every 6 (six) hours as needed for wheezing or shortness of breath. 150 mL 1  . amLODipine (NORVASC) 10 MG tablet Take 1 tablet (10 mg total) by mouth daily. 90 tablet 2  . aspirin 81 MG tablet Take 81 mg by mouth daily.      Marland Kitchen atorvastatin (LIPITOR) 20 MG tablet Take 10 mg by mouth daily.    . benazepril (LOTENSIN) 40 MG tablet Take 1 tablet (40 mg total) by mouth daily. 90 tablet 2  . budesonide-formoterol (SYMBICORT) 160-4.5 MCG/ACT inhaler Inhale 2 puffs into the lungs 2 (two) times daily. 3 Inhaler 2  . dexamethasone (DECADRON) 4 MG tablet Take 5 tabs with food (=94m) 12 hrs prior to chemo and 6 hrs with food prior to chemo 30 tablet 0  . diclofenac sodium (VOLTAREN) 1 % GEL Apply 2 g topically 4 (four) times daily as needed.     . diphenhydramine-acetaminophen (TYLENOL PM) 25-500 MG TABS Take 1 tablet by mouth at bedtime as needed.    . ferrous fumarate (HEMOCYTE) 325 (106 FE) MG TABS tablet Take 1 tablet daily on an empty stomach with OJ. 30 each 4  . fluticasone (FLONASE) 50 MCG/ACT nasal spray Place 2 sprays into the nose daily. 16 g 0  . furosemide (LASIX) 20 MG tablet Take 1 tablet (20 mg total) by mouth daily. 90 tablet 0  . HYDROcodone-homatropine (HYCODAN) 5-1.5 MG/5ML syrup Take 5 mLs by mouth every 8 (eight) hours as needed for cough. 120 mL 0  . levothyroxine (SYNTHROID, LEVOTHROID) 25 MCG tablet Take 1 tablet (25 mcg total) by mouth as directed. 1 tab daily except on Saturdays take 2 tabs 102 tablet 2  .  lidocaine-prilocaine (EMLA) cream Apply 1-2 [facility]  prior access as directed. 30 g 1  . LORazepam (ATIVAN) 1 MG tablet Place 1 tablet under the tongue or swallow every 6 hours as needed for nausea.  Will make you drowsy. 20 tablet 0  . meloxicam (MOBIC) 15 MG tablet Take 15 mg by mouth daily as needed.     . Multiple Vitamin (MULTIVITAMIN) tablet Take 1 tablet by mouth daily.    . nebivolol (BYSTOLIC) 10 MG tablet Take 1 tablet (10 mg total) by mouth 2 (two) times daily. 180 tablet 2  . ondansetron (ZOFRAN) 8 MG tablet Take 1 tablet (8 mg total) by mouth every 8 (eight) hours as needed for nausea or vomiting. Will  not make drowsy. 30 tablet 01  . pantoprazole (PROTONIX) 40 MG tablet Take 1 tablet (40 mg total) by mouth daily. 90 tablet 2  . pentoxifylline (TRENTAL) 400 MG CR tablet Take 400 mg by mouth 2 (two) times daily.     . Probiotic Product (ALIGN) 4 MG CAPS Take 1 capsule by mouth daily.     . traMADol (ULTRAM) 50 MG tablet Take 1-2 tablets by mouth as needed every 8 hours for pain. 30 tablet 0  . triamcinolone (KENALOG) 0.1 % cream Apply 1 application topically. Apply topically to affected area once daily  No current facility-administered medications for this visit.    Functional Status:  In your present state of health, do you have any difficulty performing the following activities: 11/25/2014 10/22/2014  Hearing? N N  Vision? N N  Difficulty concentrating or making decisions? N N  Walking or climbing stairs? N N  Dressing or bathing? N N  Doing errands, shopping? Y N  Preparing Food and eating ? N N  Using the Toilet? N N  In the past six months, have you accidently leaked urine? N N  Do you have problems with loss of bowel control? N N  Managing your Medications? N N  Managing your Finances? N N  Housekeeping or managing your Housekeeping? Y N    Fall/Depression Screening:  PHQ 2/9 Scores 11/25/2014 10/22/2014  PHQ - 2 Score 0 0    Assessment:   CSW was  able to make contact with patient today to explain to patient that [facility] would be providing social work services through [employer], moving forward, as previous CSW is no longer with the health system.  CSW then introduced self, explained role and types of services provided through [employer].  CSW briefly explained the reason for the call, indicating that CSW wanted to confirm that patient was able to obtain all information necessary to submit her application for Aid & Attendance benefits to the Department of [facility] for processing.  CSW was able to obtain verbal consent from patient to converse with CSW, in addition to getting patient to recite two HIPAA compliant identifiers (which included her name and date of birth).  Patient indicated to CSW that she was able to submit her Aid & Attendance application for processing and has been told that eligibility is imminent.  CSW congratulated patient for being able to submit her application in such a timely fashion, as the application process typically takes several months.  Patient went on to say that she has already been in contact with several home care agencies that may be providing the home care services for her and her husband.  Patient is now just awaiting the final approval and determination of hours of service received each week.  CSW inquired as to how CSW could be of further assistance to patient at this time.  Patient denied being able to identify any additional social work needs at this time.  CSW provided patient with CSW's contact information, encouraging patient to contact CSW directly if social work needs are identified in the future.  Patient voiced understanding and was agreeable to this plan.  Patient is aware that she will continue to receive disease management services through her RNCM with [facility], Raina Mina.  CSW will contact Ms. Matthew's  to report findings of phone conversation with patient today.  Plan:    CSW will perform a case closure on patient, as all goals of treatment from social work standpoint have been met and no additional social work needs have been identified at this time. CSW will fax a correspondence letter to patient's Primary Care Physician, Dr. Penni Homans to ensure that Dr. Charlett Blake is aware of CSW's involvement with patient. CSW will submit a case closure request to Lurline Del, Care Management Assistant with [facility], in the form of an [employer].  Nat Christen, BSW, MSW, [facility] [place], [place], [place] [address] [email or web address] [phone]

## 2014-11-26 ENCOUNTER — Other Ambulatory Visit: Payer: Self-pay | Admitting: *Deleted

## 2014-11-26 NOTE — Patient Outreach (Signed)
[facility]) Care Management  11/26/2014  Katelyn Fisher 02/04/1951 [id number]   RN returned a call to pt based upon a voice message received as pt was requesting to cancelled today's appointment and rescheduled for next week. Pt states she recently received an "injection to her leg" that has been giving her problems.. States since the injection it has improved but she wanted to schedule the next home visit on another day. RN inquired on her ongoing medical issues as pt states her blood pressures have been 120-122/82-84 with no symptoms encountered. States she is doing good concerning her blood pressures. Denies any other issues at this time as RN will rescheduled the next home visit for next Thursday at 11:30AM.     Raina Mina, RN Care Management Coordinator [place] [phone]

## 2014-11-28 ENCOUNTER — Other Ambulatory Visit: Payer: Self-pay | Admitting: Oncology

## 2014-11-28 ENCOUNTER — Encounter: Payer: Self-pay | Admitting: Family Medicine

## 2014-11-28 NOTE — Assessment & Plan Note (Addendum)
Poorly controlled will not alter medications, encouraged DASH diet, minimize caffeine and obtain adequate sleep. Report concerning symptoms and follow up as directed and as needed. Did not take meds this am

## 2014-11-28 NOTE — Assessment & Plan Note (Signed)
Sugar labile with chemo and radiation.

## 2014-11-28 NOTE — Assessment & Plan Note (Signed)
Following closely with oncology and has chemo every 3 weeks and has had radiation as well.

## 2014-11-28 NOTE — Assessment & Plan Note (Signed)
encouraged heart healthy diet, avoid trans fats, minimize simple carbs and saturated fats. Increase exercise as tolerated 

## 2014-11-28 NOTE — Progress Notes (Signed)
Katelyn Fisher  [id number] 06-Dec-1950 11/28/2014      Progress Note-Follow Up  Subjective  Chief Complaint  Chief Complaint  Patient presents with  . Follow-up    3 month    HPI  Patient is a 64 y.o. female in today for routine medical care. Patient is in today accompanied by her husband with numerous concerns. She has been diagnosed with endometrial cancer and is undergoing chemotherapy and radiation. Is tolerating it but acknowledges fatigue, malaise, nausea and anorexia. She acknowledges depression and anxiety. Overall feels she is coping fairly well. Did not take all of her medications today as prescribed but normally does. Is in need of removal referral to her allergist to manage his her asthma, Dr. Frances Furbish as well as referral to orthopedist, Dr. Nelva Bush for ongoing management of chronic pain.  Past Medical History  Diagnosis Date  . Asthma   . Hypertension   . Obesity   . Hx of colonic polyps   . GERD (gastroesophageal reflux disease)   . History of colonic diverticulitis   . Arthritis     back  . Hyperlipidemia   . Depression   . Low back pain radiating to right leg 08/18/2012  . Anxiety   . Benign paroxysmal positional vertigo 11/01/2012  . Thyroid disease 06/01/2013  . Cervical cancer screening 10/09/2010    Qualifier: Diagnosis of  By: Wynona Luna   . 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  . Preventative health care 12/09/2013  . Cancer     endometrial    Past Surgical History  Procedure Laterality Date  . Cholecystectomy  1991  . Dilation and curettage of uterus    . Tubal ligation    . Appendectomy    . Abdominal hysterectomy  06/23/14    [facility], TRH/BSO    Family History  Problem Relation Age of Onset  . Heart disease Maternal Aunt     x 3 -2 brothers  . Hypertension Maternal Aunt   . Cancer Maternal Aunt     2 maternal aunts, breast and ovarian  . Prostate cancer Father   . Diabetes Father   . Cancer Father     lung cancer, asbestos exposure  .  Heart disease Brother   . Diabetes Brother   . Other      no FH colon cancer  . Diabetes Mother   . Lupus Mother   . Heart disease Brother   . Hyperlipidemia Sister   . Heart disease Brother   . Diabetes Brother   . Heart disease Brother   . Diabetes Brother     History   Social History  . Marital Status: Married    Spouse Name: N/A  . Number of Children: N/A  . Years of Education: N/A   Occupational History  . Not on file.   Social History Main Topics  . Smoking status: Never Smoker   . Smokeless tobacco: Never Used  . Alcohol Use: No  . Drug Use: No  . Sexual Activity: Yes     Comment: lives with husband, no dietary restrictions   G2 P 2   Other Topics Concern  . Not on file   Social History Narrative   Married- 40 years   Never Smoked   Alcohol use-no   Drug use-no   Daily Caffeine Use   Occupation: housewife   Caffeine use/day:  None   Does Patient Exercise:  yes    Current Outpatient Prescriptions on File Prior to Visit  Medication Sig Dispense Refill  . albuterol (PROAIR HFA) 108 (90 BASE) MCG/ACT inhaler Inhale 2 puffs into the lungs 2 (two) times daily as needed. 3 Inhaler 2  . albuterol (PROVENTIL) (2.5 MG/3ML) 0.083% nebulizer solution Take 3 mLs (2.5 mg total) by nebulization every 6 (six) hours as needed for wheezing or shortness of breath. 150 mL 1  . aspirin 81 MG tablet Take 81 mg by mouth daily.      Marland Kitchen atorvastatin (LIPITOR) 20 MG tablet Take 10 mg by mouth daily.    . budesonide-formoterol (SYMBICORT) 160-4.5 MCG/ACT inhaler Inhale 2 puffs into the lungs 2 (two) times daily. 3 Inhaler 2  . dexamethasone (DECADRON) 4 MG tablet Take 5 tabs with food (=20mg ) 12 hrs prior to chemo and 6 hrs with food prior to chemo 30 tablet 0  . diclofenac sodium (VOLTAREN) 1 % GEL Apply 2 g topically 4 (four) times daily as needed.     . diphenhydramine-acetaminophen (TYLENOL PM) 25-500 MG TABS Take 1 tablet by mouth at bedtime as needed.    . ferrous fumarate  (HEMOCYTE) 325 (106 FE) MG TABS tablet Take 1 tablet daily on an empty stomach with OJ. 30 each 4  . fluticasone (FLONASE) 50 MCG/ACT nasal spray Place 2 sprays into the nose daily. 16 g 0  . furosemide (LASIX) 20 MG tablet Take 1 tablet (20 mg total) by mouth daily. 90 tablet 0  . HYDROcodone-homatropine (HYCODAN) 5-1.5 MG/5ML syrup Take 5 mLs by mouth every 8 (eight) hours as needed for cough. 120 mL 0  . lidocaine-prilocaine (EMLA) cream Apply 1-2 hrs to [facility] cath site  prior access as directed. 30 g 1  . LORazepam (ATIVAN) 1 MG tablet Place 1 tablet under the tongue or swallow every 6 hours as needed for nausea.  Will make you drowsy. 20 tablet 0  . meloxicam (MOBIC) 15 MG tablet Take 15 mg by mouth daily as needed.     . Multiple Vitamin (MULTIVITAMIN) tablet Take 1 tablet by mouth daily.    . ondansetron (ZOFRAN) 8 MG tablet Take 1 tablet (8 mg total) by mouth every 8 (eight) hours as needed for nausea or vomiting. Will  not make drowsy. 30 tablet 01  . pantoprazole (PROTONIX) 40 MG tablet Take 1 tablet (40 mg total) by mouth daily. 90 tablet 2  . pentoxifylline (TRENTAL) 400 MG CR tablet Take 400 mg by mouth 2 (two) times daily.     . Probiotic Product (ALIGN) 4 MG CAPS Take 1 capsule by mouth daily.     . traMADol (ULTRAM) 50 MG tablet Take 1-2 tablets by mouth as needed every 8 hours for pain. 30 tablet 0  . triamcinolone (KENALOG) 0.1 % cream Apply 1 application topically. Apply topically to affected area once daily      No current facility-administered medications on file prior to visit.    Allergies  Allergen Reactions  . Oxycodone Swelling    Facial swelling and tightness  . Penicillins Swelling    Rash with welps  . Shellfish Allergy Hives, Shortness Of Breath and Swelling  . Iodine     Rash  . Sulfa Antibiotics Rash    Review of Systems  Review of Systems  Constitutional: Positive for malaise/fatigue. Negative for fever.  HENT: Negative for congestion.   Eyes:  Negative for discharge.  Respiratory: Negative for shortness of breath.   Cardiovascular: Negative for chest pain, palpitations and leg swelling.  Gastrointestinal: Negative for nausea, abdominal pain and diarrhea.  Genitourinary: Negative for dysuria.  Musculoskeletal: Positive for back pain. Negative for falls.  Skin: Negative for rash.  Neurological: Negative for loss of consciousness and headaches.  Endo/Heme/Allergies: Negative for polydipsia.  Psychiatric/Behavioral: Negative for depression and suicidal ideas. The patient is nervous/anxious. The patient does not have insomnia.     Objective  BP 154/97 mmHg  Pulse 88  Temp(Src) 98.5 F (36.9 C) (Oral)  Ht 5' 2.5" (1.588 m)  Wt 238 lb 6 oz (108.126 kg)  BMI 42.88 kg/m2  SpO2 100%  Physical Exam  Physical Exam  Constitutional: She is oriented to person, place, and time and well-developed, well-nourished, and in no distress. No distress.  HENT:  Head: Normocephalic and atraumatic.  Eyes: Conjunctivae are normal.  Neck: Neck supple. No thyromegaly present.  Cardiovascular: Normal rate, regular rhythm and normal heart sounds.   No murmur heard. Pulmonary/Chest: Effort normal and breath sounds normal. She has no wheezes.  Abdominal: She exhibits no distension and no mass.  Musculoskeletal: She exhibits no edema.  Lymphadenopathy:    She has no cervical adenopathy.  Neurological: She is alert and oriented to person, place, and time.  Skin: Skin is warm and dry. No rash noted. She is not diaphoretic.  Psychiatric: Memory, affect and judgment normal.    Lab Results  Component Value Date   TSH 2.93 06/16/2014   Lab Results  Component Value Date   WBC 4.2 11/16/2014   HGB 11.3* 11/16/2014   HCT 36.5 11/16/2014   MCV 69.1* 11/16/2014   PLT 234 11/16/2014   Lab Results  Component Value Date   CREATININE 0.8 11/16/2014   BUN 13.2 11/16/2014   NA 140 11/16/2014   K 4.1 11/16/2014   CL 105 06/16/2014   CO2 19*  11/16/2014   Lab Results  Component Value Date   ALT 24 11/16/2014   AST 16 11/16/2014   ALKPHOS 87 11/16/2014   BILITOT <0.20 11/16/2014   Lab Results  Component Value Date   CHOL 174 11/19/2014   Lab Results  Component Value Date   HDL 49.50 11/19/2014   Lab Results  Component Value Date   LDLCALC 105* 06/16/2014   Lab Results  Component Value Date   TRIG 282.0* 11/19/2014   Lab Results  Component Value Date   CHOLHDL 4 11/19/2014     Assessment & Plan  Essential hypertension Poorly controlled will not alter medications, encouraged DASH diet, minimize caffeine and obtain adequate sleep. Report concerning symptoms and follow up as directed and as needed. Did not take meds this am   GERD Avoid offending foods, start probiotics. Do not eat large meals in late evening and consider raising head of bed.    Diabetes mellitus type 2, controlled Sugar labile with chemo and radiation.    Endometrial cancer Following closely with oncology and has chemo every 3 weeks and has had radiation as well.    Hyperlipidemia encouraged heart healthy diet, avoid trans fats, minimize simple carbs and saturated fats. Increase exercise as tolerated

## 2014-11-28 NOTE — Assessment & Plan Note (Signed)
Avoid offending foods, start probiotics. Do not eat large meals in late evening and consider raising head of bed.  

## 2014-11-29 ENCOUNTER — Encounter: Payer: Self-pay | Admitting: Genetic Counselor

## 2014-11-29 ENCOUNTER — Ambulatory Visit (HOSPITAL_BASED_OUTPATIENT_CLINIC_OR_DEPARTMENT_OTHER): Payer: Commercial Managed Care - HMO | Admitting: Genetic Counselor

## 2014-11-29 ENCOUNTER — Telehealth: Payer: Self-pay | Admitting: Oncology

## 2014-11-29 ENCOUNTER — Other Ambulatory Visit (HOSPITAL_BASED_OUTPATIENT_CLINIC_OR_DEPARTMENT_OTHER): Payer: Commercial Managed Care - HMO

## 2014-11-29 ENCOUNTER — Other Ambulatory Visit: Payer: Commercial Managed Care - HMO

## 2014-11-29 ENCOUNTER — Encounter: Payer: Self-pay | Admitting: Oncology

## 2014-11-29 ENCOUNTER — Ambulatory Visit (HOSPITAL_BASED_OUTPATIENT_CLINIC_OR_DEPARTMENT_OTHER): Payer: Commercial Managed Care - HMO | Admitting: Oncology

## 2014-11-29 VITALS — BP 133/88 | HR 83 | Temp 98.0°F | Resp 18 | Ht 62.5 in | Wt 236.5 lb

## 2014-11-29 DIAGNOSIS — K219 Gastro-esophageal reflux disease without esophagitis: Secondary | ICD-10-CM | POA: Diagnosis not present

## 2014-11-29 DIAGNOSIS — I10 Essential (primary) hypertension: Secondary | ICD-10-CM

## 2014-11-29 DIAGNOSIS — C541 Malignant neoplasm of endometrium: Secondary | ICD-10-CM

## 2014-11-29 DIAGNOSIS — M79604 Pain in right leg: Secondary | ICD-10-CM | POA: Diagnosis not present

## 2014-11-29 DIAGNOSIS — Z95828 Presence of other vascular implants and grafts: Secondary | ICD-10-CM

## 2014-11-29 DIAGNOSIS — Z8041 Family history of malignant neoplasm of ovary: Secondary | ICD-10-CM

## 2014-11-29 DIAGNOSIS — Z315 Encounter for genetic counseling: Secondary | ICD-10-CM | POA: Diagnosis not present

## 2014-11-29 DIAGNOSIS — Z803 Family history of malignant neoplasm of breast: Secondary | ICD-10-CM | POA: Diagnosis not present

## 2014-11-29 DIAGNOSIS — Z8042 Family history of malignant neoplasm of prostate: Secondary | ICD-10-CM | POA: Insufficient documentation

## 2014-11-29 DIAGNOSIS — D701 Agranulocytosis secondary to cancer chemotherapy: Secondary | ICD-10-CM | POA: Insufficient documentation

## 2014-11-29 DIAGNOSIS — R112 Nausea with vomiting, unspecified: Secondary | ICD-10-CM

## 2014-11-29 DIAGNOSIS — G620 Drug-induced polyneuropathy: Secondary | ICD-10-CM

## 2014-11-29 DIAGNOSIS — Z800 Family history of malignant neoplasm of digestive organs: Secondary | ICD-10-CM | POA: Diagnosis not present

## 2014-11-29 DIAGNOSIS — Z6841 Body Mass Index (BMI) 40.0 and over, adult: Secondary | ICD-10-CM

## 2014-11-29 DIAGNOSIS — G622 Polyneuropathy due to other toxic agents: Secondary | ICD-10-CM | POA: Diagnosis not present

## 2014-11-29 DIAGNOSIS — T451X5A Adverse effect of antineoplastic and immunosuppressive drugs, initial encounter: Secondary | ICD-10-CM | POA: Insufficient documentation

## 2014-11-29 DIAGNOSIS — K635 Polyp of colon: Secondary | ICD-10-CM

## 2014-11-29 LAB — COMPREHENSIVE METABOLIC PANEL (CC13)
ALBUMIN: 3.5 g/dL (ref 3.5–5.0)
ALK PHOS: 85 U/L (ref 40–150)
ALT: 25 U/L (ref 0–55)
AST: 15 U/L (ref 5–34)
Anion Gap: 13 mEq/L — ABNORMAL HIGH (ref 3–11)
BUN: 10.2 mg/dL (ref 7.0–26.0)
CO2: 24 mEq/L (ref 22–29)
CREATININE: 0.8 mg/dL (ref 0.6–1.1)
Calcium: 9.1 mg/dL (ref 8.4–10.4)
Chloride: 107 mEq/L (ref 98–109)
GLUCOSE: 132 mg/dL (ref 70–140)
POTASSIUM: 3.8 meq/L (ref 3.5–5.1)
Sodium: 144 mEq/L (ref 136–145)
Total Bilirubin: <0.2 mg/dL (ref 0.20–1.20)
Total Protein: 6.8 g/dL (ref 6.4–8.3)

## 2014-11-29 LAB — CBC WITH DIFFERENTIAL/PLATELET
BASO%: 0.4 % (ref 0.0–2.0)
Basophils Absolute: 0 10*3/uL (ref 0.0–0.1)
EOS%: 0.7 % (ref 0.0–7.0)
Eosinophils Absolute: 0.1 10*3/uL (ref 0.0–0.5)
HCT: 34.1 % — ABNORMAL LOW (ref 34.8–46.6)
HEMOGLOBIN: 10.4 g/dL — AB (ref 11.6–15.9)
LYMPH#: 2.3 10*3/uL (ref 0.9–3.3)
LYMPH%: 34.9 % (ref 14.0–49.7)
MCH: 21.5 pg — ABNORMAL LOW (ref 25.1–34.0)
MCHC: 30.5 g/dL — AB (ref 31.5–36.0)
MCV: 70.6 fL — ABNORMAL LOW (ref 79.5–101.0)
MONO#: 0.7 10*3/uL (ref 0.1–0.9)
MONO%: 10.7 % (ref 0.0–14.0)
NEUT#: 3.6 10*3/uL (ref 1.5–6.5)
NEUT%: 53.3 % (ref 38.4–76.8)
NRBC: 0 % (ref 0–0)
PLATELETS: 138 10*3/uL — AB (ref 145–400)
RBC: 4.83 10*6/uL (ref 3.70–5.45)
RDW: 18.8 % — ABNORMAL HIGH (ref 11.2–14.5)
WBC: 6.7 10*3/uL (ref 3.9–10.3)

## 2014-11-29 NOTE — Progress Notes (Signed)
OFFICE PROGRESS NOTE   Nov 29, 2014   Physicians:Emma Rossi/ Janie Morning, Mosie Lukes, MD,Marie-Lynn Saul Fordyce, J.Bardelas, Jari Pigg, Verl Blalock Also followed by [facility] outreach RN  Note from Dr Nelva Bush 11-24-14 received and will be scanned into this EMR.  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 She will have cycle 6 carbo taxol on 12-06-14 with granix on 5-23,24,25, then HDR by Dr Isidore Moos. She had follow up with Dr Skeet Latch on 11-11-14 and will see her again in 4 months. She will see Dr Isidore Moos on 12-17-14. She did not have imaging prior to surgery, which likely will be appropriate for baseline after treatment completed.  Patient continues to tolerate chemotherapy well overall, with nausea controlled since addition of EMEND to antiemetics, taxol and granix aches better with claritin and no marked fatigue. She noticed some more peripheral neuropathy in feet since cycle 5 chemo, described as "sand between my toes", which has improved tho not completely resolved now, but not bothersome; she had minimal numbness tips of fingers only which has again resolved. Bowels are moving regularly. PAC is functioning well. She has been walking a little outdoors twice daily, understands that this will be very useful in getting strength back after treatment and hopefully for continued weight loss; she is wearing pedometer now.  PAC in Flu vaccine done To see genetics counselor today  [facility] home nurse sees her monthly, follows BP etc, thru [employer].  ONCOLOGIC HISTORY Patient presented to Dr Dellis Filbert with new onset postmenopausal spotting. [place] 05-07-14 reportedly had thickened endometrial stripe, with 3 fibroids largest 5x 5x 3.5 cm; endometrial biopsy 05-07-14 reportedly was concerning for at least FIGO grade 1 endometrial carcinoma. She had  consultation with Dr Denman George on 05-20-14. Surgery was robotic hysterectomy, BSO, bilateral pelvic and para-aortic nodes by Dr Skeet Latch at [facility] on 06-23-14. Patient did well with surgery, discharged home on POD #1. Pathology [id number]) found mixed high grade carcinoma (serous 50% and endometrioid 50%) FIGO grade 3, involving inner half of myometrium with depth 5 mm where wall 3 cm thick, no serosal or lower uterine segment involvement, no cervical or adnexal involvement, LVSI present, 0/15 nodes involved. PET/ CT at [facility] 07-30-14 had no findings of concern for distant disease. [facility] multidisciplinary conference recommended 6 cycles of taxane platinum chemotherapy followed by vaginal brachytherapy, and for genetics counseling.  Cycle 1 carboplatin taxol was given 2-08-24-14, with neutropenia day 10, granix added. Initial severe nausea controlled with addition of EMEND and aches controlled with claritin.    Review of systems as above, also: No fever or symptoms of infection. Taste bad for plain water x 1 week after chemo, but can drink flavored water then. Steroid injection to right knee by Dr Nelva Bush last week very helpful, last done ~ 6 mo ago. No bleeding. Bowels moving regularly and bladder ok. Knee high teds helpful. Remainder of 10 point Review of Systems negative.  Objective:  Vital signs in last 24 hours:  BP 133/88 mmHg  Pulse 83  Temp(Src) 98 F (36.7 C) (Oral)  Resp 18  Ht 5' 2.5" (1.588 m)  Wt 236 lb 8 oz (107.276 kg)  BMI 42.54 kg/m2 Weight down 2 lbs Alert, oriented and appropriate. Ambulatory without difficulty.  Complete alopecia  HEENT:PERRL, sclerae not icteric. Oral mucosa moist without lesions, posterior pharynx clear. Dentures. Neck supple. No JVD.  Lymphatics:no cervical,supraclavicular adenopathy  Resp: clear to auscultation bilaterally and normal percussion bilaterally Cardio: regular rate and rhythm. No gallop. GI: abdomen obese, soft, nontender, not distended, no mass or  organomegaly. Normally active bowel sounds. Surgical incision not remarkable. Musculoskeletal/ Extremities: without pitting edema, cords, tenderness, knee high teds on Neuro:  peripheral neuropathy as noted. Otherwise nonfocal. PSYCH appropriate mood and affect Skin without rash, ecchymosis, petechiae  Portacath-without erythema or tenderness  Lab Results:  Results for orders placed or performed in visit on 11/29/14  CBC with Differential  Result Value Ref Range   WBC 6.7 3.9 - 10.3 10e3/uL   NEUT# 3.6 1.5 - 6.5 10e3/uL   HGB 10.4 (L) 11.6 - 15.9 g/dL   HCT 34.1 (L) 34.8 - 46.6 %   Platelets 138 (L) 145 - 400 10e3/uL   MCV 70.6 (L) 79.5 - 101.0 fL   MCH 21.5 (L) 25.1 - 34.0 pg   MCHC 30.5 (L) 31.5 - 36.0 g/dL   RBC 4.83 3.70 - 5.45 10e6/uL   RDW 18.8 (H) 11.2 - 14.5 %   lymph# 2.3 0.9 - 3.3 10e3/uL   MONO# 0.7 0.1 - 0.9 10e3/uL   Eosinophils Absolute 0.1 0.0 - 0.5 10e3/uL   Basophils Absolute 0.0 0.0 - 0.1 10e3/uL   NEUT% 53.3 38.4 - 76.8 %   LYMPH% 34.9 14.0 - 49.7 %   MONO% 10.7 0.0 - 14.0 %   EOS% 0.7 0.0 - 7.0 %   BASO% 0.4 0.0 - 2.0 %   nRBC 0 0 - 0 %   CMET available after visit all WNL including creat 0.8, Tbili <0.2, rest of electrolytes and LFTs  Studies/Results:  No results found.  Medications: I have reviewed the patient's current medications. Dr Nelva Bush rewrote tramadol script  DISCUSSION: she understands that we will leave PAC in for at least several months after treatment completes, in case needed; it can be used for blood draws and scans, otherwise will need flush every 6-8 weeks when not otherwise used. Note her peripheral IV access is very difficult Discussed peripheral neuropathy as complication of taxol, usually improves and hopefully resolves given time out from chemo. Recommended lots of movement, massage, exercise to help feet but would not decrease chemo dose for last cycle with this degree of neuropathy. She understands that I will see her back a few  weeks after upcoming chemo, to follow up blood counts and that treatment; my appointment date/time can be adjusted if needed based on RT appointments. Likely will set up scans after RT completes and prior to seeing Dr Skeet Latch next. Encouraged focusing on weight loss to ideal after treatment completes, which she is interested in doing.   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 Tolerated cycle 3 well with EMEND and claritin with gCSF. Cycle 6 will be given on 12-06-14 as long as ANC >=1.5 and plt >=100k. She will have gCSF 5-23, 24, 25 and I will see her back with labs after cycle 6. Genetics referral. HDR to follow chemo, Dr Skeet Latch again in Aug. 2.HTN managed by Dr Charlett Blake. Diuretics held after first chemo due to dehydration. 3.PAC in 4.aches from taxol and gCSF. controlled with claritin beginning day of chemo and prn tramadol  5. 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 She has been able to lose 2 lbs since seen last, encouraged.   All questions answered and patient is comfortable with recommendations and plans. Chemo and granix orders confirmed. Time spent 25 min including >50% counseling and coordination of care.  Patient very appreciative of care from entire health team   Gordy Levan, MD   11/29/2014, 9:13 AM

## 2014-11-29 NOTE — Progress Notes (Signed)
REFERRING PROVIDER: Mosie Lukes, MD [address] [place], [place] [address]   Evlyn Clines, MD  PRIMARY PROVIDER:  Penni Homans, MD  PRIMARY REASON FOR VISIT:  1. Endometrial cancer   2. Colon polyps   3. Family history of breast cancer   4. Family history of ovarian cancer   5. Family history of colon cancer   6. Family history of prostate cancer      HISTORY OF PRESENT ILLNESS:   [name], a 64 y.o. female, was seen for a [facility] cancer genetics consultation at the request of Dr. Charlett Blake due to a personal and family history of cancer.  [name] presents to clinic today to discuss the possibility of a hereditary predisposition to cancer, genetic testing, and to further clarify her future cancer risks, as well as potential cancer risks for family members.   In 2016, at the age of 63, [name] was diagnosed with Endometrioid carcinoma of the uterus. This was treated with surgery and will be treated with chemotherapy and radiation.  No tumor testing was performed.    CANCER HISTORY:   No history exists.     HORMONAL RISK FACTORS:  Menarche was at age 69.  First live birth at age 11.  OCP use for approximately 10+ years.  Ovaries intact: no.  Hysterectomy: yes.  Menopausal status: postmenopausal.  HRT use: 0 years. Colonoscopy: yes; approximately 3 colon polyps, colonoscopy every 5 years.. Mammogram within the last year: yes. Number of breast biopsies: 0. Up to date with pelvic exams:  yes. Any excessive radiation exposure in the past:  no  Past Medical History  Diagnosis Date  . Asthma   . Hypertension   . Obesity   . Hx of colonic polyps   . GERD (gastroesophageal reflux disease)   . History of colonic diverticulitis   . Arthritis     back  . Hyperlipidemia   . Depression   . Low back pain radiating to right leg 08/18/2012  . Anxiety   . Benign paroxysmal positional vertigo 11/01/2012  . Thyroid disease 06/01/2013  . Cervical  cancer screening 10/09/2010    Qualifier: Diagnosis of  By: Wynona Luna   . 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  . Preventative health care 12/09/2013  . Cancer     endometrial  . Family history of breast cancer   . Family history of ovarian cancer   . Family history of prostate cancer   . Family history of colon cancer     Past Surgical History  Procedure Laterality Date  . Cholecystectomy  1991  . Dilation and curettage of uterus    . Tubal ligation    . Appendectomy    . Abdominal hysterectomy  06/23/14    [facility], TRH/BSO    History   Social History  . Marital Status: Married    Spouse Name: N/A  . Number of Children: N/A  . Years of Education: N/A   Social History Main Topics  . Smoking status: Never Smoker   . Smokeless tobacco: Never Used  . Alcohol Use: No  . Drug Use: No  . Sexual Activity: Yes     Comment: lives with husband, no dietary restrictions   G2 P 2   Other Topics Concern  . None   Social History Narrative   Married- 40 years   Never Smoked   Alcohol use-no   Drug use-no   Daily Caffeine Use   Occupation: housewife  Caffeine use/day:  None   Does Patient Exercise:  yes     FAMILY HISTORY:  We obtained a detailed, 4-generation family history.  Significant diagnoses are listed below: Family History  Problem Relation Age of Onset  . Heart disease Maternal Aunt     x 3 -2 brothers  . Hypertension Maternal Aunt   . Breast cancer Maternal Aunt     maternal half; dx in her 63s  . Ovarian cancer Maternal Aunt     dx in her 90s  . Prostate cancer Father 35  . Diabetes Father   . Cancer Father     lung cancer, asbestos exposure  . Heart disease Brother   . Diabetes Brother   . Other      no FH colon cancer  . Diabetes Mother   . Lupus Mother   . Heart disease Brother   . Hyperlipidemia Sister   . Heart disease Brother   . Diabetes Brother   . Colon polyps Brother   . Heart disease Brother   . Diabetes Brother   . Colon polyps  Brother   . Colon cancer Maternal Grandmother     dx in her 76s  . Leukemia Maternal Uncle 21  . Cancer Paternal Aunt     NOS  . Prostate cancer Paternal Uncle     dx in his 54s  . Breast cancer Other     dx in her 99s   The patient has four full brothers, one full sister and a paternal half brother.  One full brother has a daughter with breast cancer.  Ms. Skerritt' father was diagnosed with prostate cancer at 48.  Her had two full sisters, one full brother and five paternal half brothers and sisters.  One full sister had a cancer NOS, and his full brother was diagnosed with prostate cancer.  His brother has a son with prostate cancer, his sister with cancer NOS had a daughter who died of breast cancer, and his full sister has two daughters with breast cancer and a son with prostate cancer.  Ms. Loisel' mother had four full sisters, a maternal half brother and two maternal half sisters.  One full sister had ovarian cancer in her 80s, her half brother died at 92 with leukemia, and a half sister with breast cancer.  Ms. Panella' maternal grandmother died of colon cancer in her 45s. Patient's maternal ancestors are of [place] American descent, and paternal ancestors are of [place] and [place] descent. There is no reported Ashkenazi Jewish ancestry. There is no known consanguinity.  GENETIC COUNSELING ASSESSMENT: HERMELA HARDT is a 64 y.o. female with a personal and family history of cancer which somewhat suggestive of a hereditary cancer syndrome, including Lynch syndrome or possibly PTEN based on the family history of cancer, which would place Ms. Furber with a predisposition to cancer. We, therefore, discussed and recommended the following at today's visit.   DISCUSSION: We reviewed the characteristics, features and inheritance patterns of hereditary cancer syndromes. Based on the paternal family history, we discussed BRCA mutations and PTEN mutations, and based on her maternal  history we discussed Lynch syndrome and possibly BRCA mutations.  We also discussed genetic testing, including the appropriate family members to test, the process of testing, insurance coverage and turn-around-time for results. We discussed the implications of a negative, positive and/or variant of uncertain significant result. We recommended Ms. Bittel pursue genetic testing for the Comprehensive Cancer gene panel. The Comprehensive Cancer Panel offered  by [facility] includes sequencing and/or deletion duplication testing of the following 32 genes: APC, ATM, AXIN2, BARD1, BMPR1A, BRCA1, BRCA2, BRIP1, CDH1, CDK4, CDKN2A, CHEK2, EPCAM, FANCC, MLH1, MSH2, MSH6, MUTYH, NBN, PALB2, PMS2, POLD1, POLE, PTEN, RAD51C, RAD51D, SCG5/GREM1, SMAD4, STK11, TP53, VHL, and XRCC2.     Based on Ms. Sol's family history, she meets medical criteria for genetic testing. Despite that she meets criteria, she may still have an out of pocket cost. We discussed that if her out of pocket cost for testing is over $100, the laboratory will call and confirm whether she wants to proceed with testing.  If the out of pocket cost of testing is less than $100 she will be billed by the genetic testing laboratory.   PLAN: After considering the risks, benefits, and limitations, Ms. Schnebly  provided informed consent to pursue genetic testing and the blood sample was sent to [facility] for analysis of the Comprehensive Cancer Panel. Results should be available within approximately 2-3 weeks' time, at which point they will be disclosed by telephone to Ms. Dimaria, as will any additional recommendations warranted by these results. Ms. Mccasland will receive a summary of her genetic counseling visit and a copy of her results once available. This information will also be available in Epic. We encouraged Ms. Bowersox to remain in contact with cancer genetics annually so that we can continuously update the family history and inform her of any  changes in cancer genetics and testing that may be of benefit for her family. Ms. Romano questions were answered to her satisfaction today. Our contact information was provided should additional questions or concerns arise.  Lastly, we encouraged Ms. Shatswell to remain in contact with cancer genetics annually so that we can continuously update the family history and inform her of any changes in cancer genetics and testing that may be of benefit for this family.   Ms.  Kakos questions were answered to her satisfaction today. Our contact information was provided should additional questions or concerns arise. Thank you for the referral and allowing [place] to share in the care of your patient.   Karen P. Florene Glen, [place], [facility] Certified Genetic Counselor [email or web address] phone: [phone]  The patient was seen for a total of 60 minutes in face-to-face genetic counseling.  This patient was discussed with Drs. Magrinat, Lindi Adie and/or Burr Medico who agrees with the above.    _______________________________________________________________________ For Office Staff:  Number of people involved in session: 1 Was an Intern/ student involved with case: no

## 2014-11-29 NOTE — Telephone Encounter (Signed)
Lab added to 5/16 and patient will get a new schedule today

## 2014-11-29 NOTE — Telephone Encounter (Signed)
Appointments made and avs printed for patient °

## 2014-12-02 ENCOUNTER — Other Ambulatory Visit: Payer: Self-pay | Admitting: *Deleted

## 2014-12-02 NOTE — Patient Outreach (Signed)
[facility]) Care Management   12/02/2014  Katelyn Fisher 05/04/1951 [id number]  Katelyn Fisher is an 64 y.o. female  Subjective:  Pt reports she is doing well with ongoing treatment (chemotherapy) and will start radiation this month. HTN: Pt states she continues to manager her HTN with daily BP monitoring on her home device with no major issues however reports one elevated reading on last Sunday (Mother's Day) but improved the following day. Pt denies any signs or symptoms of HTN and continues to report she is view the EMMI programs assigned last month (very helpful). EDEMA: Pt states she continues to wear her compression stockings daily to reduce ongoing swelling to her lower legs. Pt verified she removes the compression stockings every night prior to bedtime with no problems.  Pt will continue to wear the compressions as recommended by her doctor. SAFETY:  Pt reports receiving a recent shot to her right knee from her orthopedic doctor for pain and swelling. Pt reports much improved along with ongoing oral medicine.  Pt uses her cane when needed for short walks when tolerated.     Objective:   Review of Systems  All other systems reviewed and are negative.   Physical Exam  Constitutional: She is oriented to person, place, and time. She appears well-developed and well-nourished.  HENT:  Right Ear: External ear normal.  Left Ear: External ear normal.  Nose: Nose normal.  Neck: Normal range of motion.  Cardiovascular: Normal rate and normal heart sounds.   Respiratory: Effort normal and breath sounds normal.  GI: Soft. Bowel sounds are normal.  Musculoskeletal: She exhibits edema.  Bilateral lower extremities  Neurological: She is alert and oriented to person, place, and time.  Skin: Skin is warm and dry.  Psychiatric: She has a normal mood and affect. Her behavior is normal. Judgment and thought content normal.    Current Medications:   Current Outpatient  Prescriptions  Medication Sig Dispense Refill  . albuterol (PROAIR HFA) 108 (90 BASE) MCG/ACT inhaler Inhale 2 puffs into the lungs 2 (two) times daily as needed. 3 Inhaler 2  . albuterol (PROVENTIL) (2.5 MG/3ML) 0.083% nebulizer solution Take 3 mLs (2.5 mg total) by nebulization every 6 (six) hours as needed for wheezing or shortness of breath. (Patient not taking: Reported on 11/29/2014) 150 mL 1  . amLODipine (NORVASC) 10 MG tablet Take 1 tablet (10 mg total) by mouth daily. 90 tablet 2  . aspirin 81 MG tablet Take 81 mg by mouth daily.      Marland Kitchen atorvastatin (LIPITOR) 20 MG tablet Take 10 mg by mouth daily.    . benazepril (LOTENSIN) 40 MG tablet Take 1 tablet (40 mg total) by mouth daily. 90 tablet 2  . budesonide-formoterol (SYMBICORT) 160-4.5 MCG/ACT inhaler Inhale 2 puffs into the lungs 2 (two) times daily. 3 Inhaler 2  . dexamethasone (DECADRON) 4 MG tablet Take 5 tabs with food (=20mg ) 12 hrs prior to chemo and 6 hrs with food prior to chemo (Patient not taking: Reported on 11/29/2014) 30 tablet 0  . diclofenac sodium (VOLTAREN) 1 % GEL Apply 2 g topically 4 (four) times daily as needed.     . diphenhydramine-acetaminophen (TYLENOL PM) 25-500 MG TABS Take 1 tablet by mouth at bedtime as needed.    . ferrous fumarate (HEMOCYTE) 325 (106 FE) MG TABS tablet Take 1 tablet daily on an empty stomach with OJ. 30 each 4  . fluticasone (FLONASE) 50 MCG/ACT nasal spray Place 2 sprays into  the nose daily. 16 g 0  . furosemide (LASIX) 20 MG tablet Take 1 tablet (20 mg total) by mouth daily. 90 tablet 0  . HYDROcodone-homatropine (HYCODAN) 5-1.5 MG/5ML syrup Take 5 mLs by mouth every 8 (eight) hours as needed for cough. (Patient not taking: Reported on 11/29/2014) 120 mL 0  . levothyroxine (SYNTHROID, LEVOTHROID) 25 MCG tablet Take 1 tablet (25 mcg total) by mouth as directed. 1 tab daily except on Saturdays take 2 tabs 102 tablet 2  . lidocaine-prilocaine (EMLA) cream Apply 1-2 hrs to [facility] cath site  prior  access as directed. 30 g 1  . LORazepam (ATIVAN) 1 MG tablet Place 1 tablet under the tongue or swallow every 6 hours as needed for nausea.  Will make you drowsy. 20 tablet 0  . meloxicam (MOBIC) 15 MG tablet Take 15 mg by mouth daily as needed.     . Multiple Vitamin (MULTIVITAMIN) tablet Take 1 tablet by mouth daily.    . nebivolol (BYSTOLIC) 10 MG tablet Take 1 tablet (10 mg total) by mouth 2 (two) times daily. 180 tablet 2  . ondansetron (ZOFRAN) 8 MG tablet Take 1 tablet (8 mg total) by mouth every 8 (eight) hours as needed for nausea or vomiting. Will  not make drowsy. 30 tablet 01  . pantoprazole (PROTONIX) 40 MG tablet Take 1 tablet (40 mg total) by mouth daily. 90 tablet 2  . pentoxifylline (TRENTAL) 400 MG CR tablet Take 400 mg by mouth 2 (two) times daily.     . Probiotic Product (ALIGN) 4 MG CAPS Take 1 capsule by mouth daily.     . sennosides-docusate sodium (SENOKOT-S) 8.6-50 MG tablet Take 1 tablet by mouth daily as needed for constipation.    . traMADol (ULTRAM) 50 MG tablet Take 1-2 tablets by mouth as needed every 8 hours for pain. 30 tablet 0  . triamcinolone (KENALOG) 0.1 % cream Apply 1 application topically. Apply topically to affected area once daily      No current facility-administered medications for this visit.    Functional Status:   In your present state of health, do you have any difficulty performing the following activities: 11/25/2014 10/22/2014  Hearing? N N  Vision? N N  Difficulty concentrating or making decisions? N N  Walking or climbing stairs? N N  Dressing or bathing? N N  Doing errands, shopping? Y N  Preparing Food and eating ? N N  Using the Toilet? N N  In the past six months, have you accidently leaked urine? N N  Do you have problems with loss of bowel control? N N  Managing your Medications? N N  Managing your Finances? N N  Housekeeping or managing your Housekeeping? Y N    Fall/Depression Screening:    PHQ 2/9 Scores 11/25/2014 10/22/2014   PHQ - 2 Score 0 0    Assessment:   Ongoing case management related HTN. Ongoing swelling related to lower legs. Safety related to right knee discomfort and treatment.  Plan:  Physical assessment completed with no acute symptoms. Will verify pt has viewed her EMMI (pt continues to view EMMI program assigned) last month and reiterated on HTN as pt adherence with documenting all her BP readings and document in calendar tool for providers to view.  Will continue to encouraged pt to elevate her lower legs to reduce ongoing swelling and to remove compression stockings prior to bedtime (comfirmed pt with understanding). Will strongly encouraged pt to follow the recommended treatment plan and  use precautionary measures with DME (cane) when needed. Plan of care discussed and goals extended to allow adherence for pt to comply with managing her care more independently. Plan to follow up with a visit in one month with ongoing case management services and re-evaluate plan of care and goals at that time.  Raina Mina, RN Care Management Coordinator [facility] Main Office [phone]

## 2014-12-06 ENCOUNTER — Other Ambulatory Visit (HOSPITAL_BASED_OUTPATIENT_CLINIC_OR_DEPARTMENT_OTHER): Payer: Commercial Managed Care - HMO

## 2014-12-06 ENCOUNTER — Ambulatory Visit (HOSPITAL_BASED_OUTPATIENT_CLINIC_OR_DEPARTMENT_OTHER): Payer: Commercial Managed Care - HMO

## 2014-12-06 ENCOUNTER — Other Ambulatory Visit: Payer: Commercial Managed Care - HMO

## 2014-12-06 VITALS — BP 136/75 | HR 101 | Temp 97.3°F

## 2014-12-06 DIAGNOSIS — C541 Malignant neoplasm of endometrium: Secondary | ICD-10-CM | POA: Diagnosis not present

## 2014-12-06 DIAGNOSIS — Z5111 Encounter for antineoplastic chemotherapy: Secondary | ICD-10-CM

## 2014-12-06 LAB — CBC WITH DIFFERENTIAL/PLATELET
BASO%: 0.3 % (ref 0.0–2.0)
BASOS ABS: 0 10*3/uL (ref 0.0–0.1)
EOS%: 0 % (ref 0.0–7.0)
Eosinophils Absolute: 0 10*3/uL (ref 0.0–0.5)
HCT: 36.1 % (ref 34.8–46.6)
HEMOGLOBIN: 11 g/dL — AB (ref 11.6–15.9)
LYMPH%: 15.5 % (ref 14.0–49.7)
MCH: 21.1 pg — AB (ref 25.1–34.0)
MCHC: 30.4 g/dL — ABNORMAL LOW (ref 31.5–36.0)
MCV: 69.4 fL — AB (ref 79.5–101.0)
MONO#: 0 10*3/uL — ABNORMAL LOW (ref 0.1–0.9)
MONO%: 0.5 % (ref 0.0–14.0)
NEUT#: 3.7 10*3/uL (ref 1.5–6.5)
NEUT%: 83.7 % — ABNORMAL HIGH (ref 38.4–76.8)
Platelets: 238 10*3/uL (ref 145–400)
RBC: 5.21 10*6/uL (ref 3.70–5.45)
RDW: 20.5 % — ABNORMAL HIGH (ref 11.2–14.5)
WBC: 4.5 10*3/uL (ref 3.9–10.3)
lymph#: 0.7 10*3/uL — ABNORMAL LOW (ref 0.9–3.3)

## 2014-12-06 LAB — COMPREHENSIVE METABOLIC PANEL (CC13)
ALBUMIN: 3.6 g/dL (ref 3.5–5.0)
ALK PHOS: 90 U/L (ref 40–150)
ALT: 21 U/L (ref 0–55)
AST: 15 U/L (ref 5–34)
Anion Gap: 14 mEq/L — ABNORMAL HIGH (ref 3–11)
BILIRUBIN TOTAL: 0.27 mg/dL (ref 0.20–1.20)
BUN: 14.1 mg/dL (ref 7.0–26.0)
CO2: 21 mEq/L — ABNORMAL LOW (ref 22–29)
Calcium: 9.5 mg/dL (ref 8.4–10.4)
Chloride: 107 mEq/L (ref 98–109)
Creatinine: 0.8 mg/dL (ref 0.6–1.1)
EGFR: 90 mL/min/{1.73_m2} — AB (ref >90)
Glucose: 246 mg/dl — ABNORMAL HIGH (ref 70–140)
POTASSIUM: 4.2 meq/L (ref 3.5–5.1)
SODIUM: 142 meq/L (ref 136–145)
TOTAL PROTEIN: 7.4 g/dL (ref 6.4–8.3)

## 2014-12-06 MED ORDER — SODIUM CHLORIDE 0.9 % IV SOLN
Freq: Once | INTRAVENOUS | Status: AC
  Administered 2014-12-06: 09:00:00 via INTRAVENOUS
  Filled 2014-12-06: qty 8

## 2014-12-06 MED ORDER — FAMOTIDINE IN NACL 20-0.9 MG/50ML-% IV SOLN
INTRAVENOUS | Status: AC
  Filled 2014-12-06: qty 50

## 2014-12-06 MED ORDER — FOSAPREPITANT DIMEGLUMINE INJECTION 150 MG
Freq: Once | INTRAVENOUS | Status: AC
  Administered 2014-12-06: 10:00:00 via INTRAVENOUS
  Filled 2014-12-06: qty 5

## 2014-12-06 MED ORDER — FAMOTIDINE IN NACL 20-0.9 MG/50ML-% IV SOLN
20.0000 mg | Freq: Once | INTRAVENOUS | Status: AC
  Administered 2014-12-06: 20 mg via INTRAVENOUS

## 2014-12-06 MED ORDER — SODIUM CHLORIDE 0.9 % IV SOLN
Freq: Once | INTRAVENOUS | Status: AC
  Administered 2014-12-06: 09:00:00 via INTRAVENOUS

## 2014-12-06 MED ORDER — DIPHENHYDRAMINE HCL 50 MG/ML IJ SOLN
50.0000 mg | Freq: Once | INTRAMUSCULAR | Status: AC
  Administered 2014-12-06: 50 mg via INTRAVENOUS

## 2014-12-06 MED ORDER — HEPARIN SOD (PORK) LOCK FLUSH 100 UNIT/ML IV SOLN
500.0000 [IU] | Freq: Once | INTRAVENOUS | Status: AC | PRN
  Administered 2014-12-06: 500 [IU]
  Filled 2014-12-06: qty 5

## 2014-12-06 MED ORDER — SODIUM CHLORIDE 0.9 % IV SOLN
750.0000 mg | Freq: Once | INTRAVENOUS | Status: AC
  Administered 2014-12-06: 750 mg via INTRAVENOUS
  Filled 2014-12-06: qty 75

## 2014-12-06 MED ORDER — PACLITAXEL CHEMO INJECTION 300 MG/50ML
175.0000 mg/m2 | Freq: Once | INTRAVENOUS | Status: AC
  Administered 2014-12-06: 384 mg via INTRAVENOUS
  Filled 2014-12-06: qty 64

## 2014-12-06 MED ORDER — DIPHENHYDRAMINE HCL 50 MG/ML IJ SOLN
INTRAMUSCULAR | Status: AC
  Filled 2014-12-06: qty 1

## 2014-12-06 MED ORDER — SODIUM CHLORIDE 0.9 % IJ SOLN
10.0000 mL | INTRAMUSCULAR | Status: DC | PRN
  Administered 2014-12-06: 10 mL
  Filled 2014-12-06: qty 10

## 2014-12-06 NOTE — Patient Instructions (Signed)
[facility] Discharge Instructions for Patients Receiving Chemotherapy  Today you received the following chemotherapy agents: Carboplatin, Taxol. To help prevent nausea and vomiting after your treatment, we encourage you to take your nausea medication.  If you develop nausea and vomiting that is not controlled by your nausea medication, call the clinic.   BELOW ARE SYMPTOMS THAT SHOULD BE REPORTED IMMEDIATELY:  *FEVER GREATER THAN 100.5 F  *CHILLS WITH OR WITHOUT FEVER  NAUSEA AND VOMITING THAT IS NOT CONTROLLED WITH YOUR NAUSEA MEDICATION  *UNUSUAL SHORTNESS OF BREATH  *UNUSUAL BRUISING OR BLEEDING  TENDERNESS IN MOUTH AND THROAT WITH OR WITHOUT PRESENCE OF ULCERS  *URINARY PROBLEMS  *BOWEL PROBLEMS  UNUSUAL RASH Items with * indicate a potential emergency and should be followed up as soon as possible.  Feel free to call the clinic you have any questions or concerns. The clinic phone number is [phone].  Please show the CHEMO ALERT CARD at check-in to the Emergency Department and triage nurse.   

## 2014-12-13 ENCOUNTER — Ambulatory Visit: Payer: Commercial Managed Care - HMO | Admitting: Family Medicine

## 2014-12-13 ENCOUNTER — Ambulatory Visit (HOSPITAL_BASED_OUTPATIENT_CLINIC_OR_DEPARTMENT_OTHER): Payer: Commercial Managed Care - HMO

## 2014-12-13 VITALS — BP 120/67 | HR 108 | Temp 98.2°F

## 2014-12-13 DIAGNOSIS — D702 Other drug-induced agranulocytosis: Secondary | ICD-10-CM | POA: Diagnosis not present

## 2014-12-13 DIAGNOSIS — C541 Malignant neoplasm of endometrium: Secondary | ICD-10-CM | POA: Diagnosis not present

## 2014-12-13 MED ORDER — TBO-FILGRASTIM 300 MCG/0.5ML ~~LOC~~ SOSY
300.0000 ug | PREFILLED_SYRINGE | Freq: Once | SUBCUTANEOUS | Status: AC
  Administered 2014-12-13: 300 ug via SUBCUTANEOUS
  Filled 2014-12-13: qty 0.5

## 2014-12-14 ENCOUNTER — Ambulatory Visit (HOSPITAL_BASED_OUTPATIENT_CLINIC_OR_DEPARTMENT_OTHER): Payer: Commercial Managed Care - HMO

## 2014-12-14 VITALS — BP 110/69 | HR 106 | Temp 97.9°F | Resp 20

## 2014-12-14 DIAGNOSIS — C541 Malignant neoplasm of endometrium: Secondary | ICD-10-CM | POA: Diagnosis not present

## 2014-12-14 DIAGNOSIS — D702 Other drug-induced agranulocytosis: Secondary | ICD-10-CM | POA: Diagnosis not present

## 2014-12-14 MED ORDER — TBO-FILGRASTIM 300 MCG/0.5ML ~~LOC~~ SOSY
300.0000 ug | PREFILLED_SYRINGE | Freq: Once | SUBCUTANEOUS | Status: AC
Start: 2014-12-14 — End: 2014-12-14
  Administered 2014-12-14: 300 ug via SUBCUTANEOUS
  Filled 2014-12-14: qty 0.5

## 2014-12-14 NOTE — Patient Instructions (Signed)

## 2014-12-15 ENCOUNTER — Ambulatory Visit (HOSPITAL_BASED_OUTPATIENT_CLINIC_OR_DEPARTMENT_OTHER): Payer: Commercial Managed Care - HMO

## 2014-12-15 VITALS — BP 143/92 | HR 97 | Temp 98.1°F

## 2014-12-15 DIAGNOSIS — C541 Malignant neoplasm of endometrium: Secondary | ICD-10-CM

## 2014-12-15 DIAGNOSIS — D702 Other drug-induced agranulocytosis: Secondary | ICD-10-CM

## 2014-12-15 MED ORDER — TBO-FILGRASTIM 300 MCG/0.5ML ~~LOC~~ SOSY
300.0000 ug | PREFILLED_SYRINGE | Freq: Once | SUBCUTANEOUS | Status: AC
  Administered 2014-12-15: 300 ug via SUBCUTANEOUS
  Filled 2014-12-15: qty 0.5

## 2014-12-15 NOTE — Progress Notes (Addendum)
ICD-9-CM ICD-10-CM   1. Endometrial cancer 182.0 C54.1    OUTPATIENT  SIMULATION AND TREATMENT PLANNING NOTE HDR BRACHYTHERAPY  NARRATIVE: The patient was brought to the [facility]. Identity was confirmed. All relevant records and images related to the planned course of therapy were reviewed. The patient freely provided informed written consent to proceed with treatment after reviewing the details related to the planned course of therapy. The consent form was witnessed and verified by the simulation staff. Then, the patient was set-up in a stable reproducible supine position for radiation therapy. The patient's custom vaginal cylinder was placed in the proximal vagina. This was fixed to the CT/MR stabilization plate to prevent slippage. CT images were obtained. Surface markings were placed. The CT images were loaded into the planning software. Then the target and avoidance structures were contoured. Treatment planning then occurred. The radiation prescription was entered and confirmed. I have requested : Brachytherapy Isodose Plan and Dosimetry Calculations to plan the radiation distribution.   PLAN: The patient will receive 30 Gy in 5 fractions, with at least 2 days between each treatment. Prescription will be to the vaginal mucosal surface. Treatment length will be 4.5 cm. The patient will be treated with a 3.0 cm diameter cylinder. Iridium 192 will be the high-dose-rate source.  -----------------------------------  Eppie Gibson, MD

## 2014-12-16 ENCOUNTER — Encounter: Payer: Self-pay | Admitting: Genetic Counselor

## 2014-12-16 ENCOUNTER — Telehealth: Payer: Self-pay | Admitting: *Deleted

## 2014-12-16 DIAGNOSIS — Z1379 Encounter for other screening for genetic and chromosomal anomalies: Secondary | ICD-10-CM | POA: Insufficient documentation

## 2014-12-16 NOTE — Telephone Encounter (Signed)
CALLED PATIENT TO REMIND OF HDR VAG. CUFF CASE FOR 12-17-14, LVM FOR A RETURN CALL

## 2014-12-17 ENCOUNTER — Ambulatory Visit: Admission: RE | Admit: 2014-12-17 | Payer: Commercial Managed Care - HMO | Source: Ambulatory Visit

## 2014-12-17 ENCOUNTER — Ambulatory Visit
Admission: RE | Admit: 2014-12-17 | Discharge: 2014-12-17 | Disposition: A | Discharge status: Home / Self Care | Payer: Commercial Managed Care - HMO | Source: Ambulatory Visit | Attending: Radiation Oncology | Admitting: Radiation Oncology

## 2014-12-17 ENCOUNTER — Encounter: Payer: Self-pay | Admitting: Radiation Oncology

## 2014-12-17 ENCOUNTER — Telehealth: Payer: Self-pay | Admitting: Genetic Counselor

## 2014-12-17 VITALS — BP 144/94 | HR 102 | Temp 98.4°F | Resp 20 | Ht 62.0 in | Wt 235.7 lb

## 2014-12-17 DIAGNOSIS — Z510 Encounter for antineoplastic radiation therapy: Secondary | ICD-10-CM | POA: Diagnosis not present

## 2014-12-17 DIAGNOSIS — Z79899 Other long term (current) drug therapy: Secondary | ICD-10-CM | POA: Diagnosis not present

## 2014-12-17 DIAGNOSIS — I10 Essential (primary) hypertension: Secondary | ICD-10-CM | POA: Diagnosis not present

## 2014-12-17 DIAGNOSIS — Z9049 Acquired absence of other specified parts of digestive tract: Secondary | ICD-10-CM | POA: Insufficient documentation

## 2014-12-17 DIAGNOSIS — Z7982 Long term (current) use of aspirin: Secondary | ICD-10-CM | POA: Insufficient documentation

## 2014-12-17 DIAGNOSIS — Z90722 Acquired absence of ovaries, bilateral: Secondary | ICD-10-CM | POA: Insufficient documentation

## 2014-12-17 DIAGNOSIS — E785 Hyperlipidemia, unspecified: Secondary | ICD-10-CM | POA: Diagnosis not present

## 2014-12-17 DIAGNOSIS — C541 Malignant neoplasm of endometrium: Secondary | ICD-10-CM

## 2014-12-17 DIAGNOSIS — J45909 Unspecified asthma, uncomplicated: Secondary | ICD-10-CM | POA: Diagnosis not present

## 2014-12-17 DIAGNOSIS — E079 Disorder of thyroid, unspecified: Secondary | ICD-10-CM | POA: Insufficient documentation

## 2014-12-17 DIAGNOSIS — Z791 Long term (current) use of non-steroidal anti-inflammatories (NSAID): Secondary | ICD-10-CM | POA: Insufficient documentation

## 2014-12-17 DIAGNOSIS — K219 Gastro-esophageal reflux disease without esophagitis: Secondary | ICD-10-CM | POA: Insufficient documentation

## 2014-12-17 DIAGNOSIS — Z90710 Acquired absence of both cervix and uterus: Secondary | ICD-10-CM | POA: Insufficient documentation

## 2014-12-17 NOTE — Progress Notes (Signed)
Follow up  Endometrial cancer,.here today for HDR treatment, no bleeding or discharge nop apin , no difficulies with bladder ,regular bowel movements, appetite good, energy level fair, right knee pain,cheronic,  8:24 AM BP 144/94 mmHg  Pulse 102  Temp(Src) 98.4 F (36.9 C) (Oral)  Resp 20  Ht 5\' 2"  (1.575 m)  Wt 235 lb 11.2 oz (106.913 kg)  BMI 43.10 kg/m2  SpO2 99%  Wt Readings from Last 3 Encounters:  12/02/14 231 lb 9.6 oz (105.053 kg)  11/29/14 236 lb 8 oz (107.276 kg)  11/19/14 238 lb 6 oz (108.126 kg)

## 2014-12-17 NOTE — Telephone Encounter (Signed)
LM on VM that we had good news on her test results. Asked that she please call back.

## 2014-12-17 NOTE — Progress Notes (Signed)
Radiation Oncology         [phone] ________________________________  Name: CRYSTA GULICK MRN: [id number]  Date: 12/17/2014  DOB: 12-29-1950  Follow-Up Visit Note  outpatient  CC: Penni Homans, MD  Janie Morning, MD  Diagnosis and Prior Radiotherapy:    ICD-9-CM ICD-10-CM   1. 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 Completed cycle 6 of carbotaxol on 12/06/14. Denies any bleeding, discharge or pain. No GI or GU complaints.  ALLERGIES:  is allergic to oxycodone; penicillins; shellfish allergy; iodine; and sulfa antibiotics.  Meds: Current Outpatient Prescriptions  Medication Sig Dispense Refill  . albuterol (PROAIR HFA) 108 (90 BASE) MCG/ACT inhaler Inhale 2 puffs into the lungs 2 (two) times daily as needed. 3 Inhaler 2  . albuterol (PROVENTIL) (2.5 MG/3ML) 0.083% nebulizer solution Take 3 mLs (2.5 mg total) by nebulization every 6 (six) hours as needed for wheezing or shortness of breath. 150 mL 1  . amLODipine (NORVASC) 10 MG tablet Take 1 tablet (10 mg total) by mouth daily. 90 tablet 2  . aspirin 81 MG tablet Take 81 mg by mouth daily.      Marland Kitchen atorvastatin (LIPITOR) 20 MG tablet Take 10 mg by mouth daily.    . benazepril (LOTENSIN) 40 MG tablet Take 1 tablet (40 mg total) by mouth daily. 90 tablet 2  . budesonide-formoterol (SYMBICORT) 160-4.5 MCG/ACT inhaler Inhale 2 puffs into the lungs 2 (two) times daily. 3 Inhaler 2  . diclofenac sodium (VOLTAREN) 1 % GEL Apply 2 g topically 4 (four) times daily as needed.     . diphenhydramine-acetaminophen (TYLENOL PM) 25-500 MG TABS Take 1 tablet by mouth at bedtime as needed.    . ferrous fumarate (HEMOCYTE) 325 (106 FE) MG TABS tablet Take 1 tablet daily on an empty stomach with OJ. 30 each 4  . fluticasone (FLONASE) 50 MCG/ACT nasal spray Place 2 sprays into the nose daily. 16 g 0  .  furosemide (LASIX) 20 MG tablet Take 1 tablet (20 mg total) by mouth daily. 90 tablet 0  . levothyroxine (SYNTHROID, LEVOTHROID) 25 MCG tablet Take 1 tablet (25 mcg total) by mouth as directed. 1 tab daily except on Saturdays take 2 tabs 102 tablet 2  . lidocaine-prilocaine (EMLA) cream Apply 1-2 hrs to [employer] cath site  prior access as directed. 30 g 1  . LORazepam (ATIVAN) 1 MG tablet Place 1 tablet under the tongue or swallow every 6 hours as needed for nausea.  Will make you drowsy. 20 tablet 0  . meloxicam (MOBIC) 15 MG tablet Take 15 mg by mouth daily as needed.     . Multiple Vitamin (MULTIVITAMIN) tablet Take 1 tablet by mouth daily.    . nebivolol (BYSTOLIC) 10 MG tablet Take 1 tablet (10 mg total) by mouth 2 (two) times daily. 180 tablet 2  . ondansetron (ZOFRAN) 8 MG tablet Take 1 tablet (8 mg total) by mouth every 8 (eight) hours as needed for nausea or vomiting. Will  not make drowsy. 30 tablet 01  . pantoprazole (PROTONIX) 40 MG tablet Take 1 tablet (40 mg total) by mouth daily. 90 tablet 2  . pentoxifylline (TRENTAL) 400 MG CR tablet Take 400 mg by mouth 2 (two) times daily.     . Probiotic Product (ALIGN) 4 MG CAPS Take 1 capsule by mouth daily.     . sennosides-docusate  sodium (SENOKOT-S) 8.6-50 MG tablet Take 1 tablet by mouth daily as needed for constipation.    . traMADol (ULTRAM) 50 MG tablet Take 1-2 tablets by mouth as needed every 8 hours for pain. 30 tablet 0  . triamcinolone (KENALOG) 0.1 % cream Apply 1 application topically. Apply topically to affected area once daily     . dexamethasone (DECADRON) 4 MG tablet Take 5 tabs with food (=20mg ) 12 hrs prior to chemo and 6 hrs with food prior to chemo (Patient not taking: Reported on 12/17/2014) 30 tablet 0  . HYDROcodone-homatropine (HYCODAN) 5-1.5 MG/5ML syrup Take 5 mLs by mouth every 8 (eight) hours as needed for cough. (Patient not taking: Reported on 12/17/2014) 120 mL 0   No current facility-administered medications for  this encounter.    Physical Findings: The patient is in no acute distress. Patient is alert and oriented.  height is 5\' 2"  (1.575 m) and weight is 235 lb 11.2 oz (106.913 kg). Her oral temperature is 98.4 F (36.9 C). Her blood pressure is 144/94 and her pulse is 102. Her respiration is 20 and oxygen saturation is 99%. .    Vaginal vault without active bleeding, fistula or sign of recurrence.   Lab Findings: Lab Results  Component Value Date   WBC 4.5 12/06/2014   HGB 11.0* 12/06/2014   HCT 36.1 12/06/2014   MCV 69.4* 12/06/2014   PLT 238 12/06/2014    Radiographic Findings: No results found.  Impression/Plan: Today we will perform treatment planning for vaginal brachytherapy. She was fitted today for the appropriate 3.0 cm width vaginal cylinder.We will proceed now with CT sim and the first treatment this afternoon. A consent form has been signed and placed in her chart.    This document serves as a record of services personally performed by Eppie Gibson, MD. It was created on her behalf by Darcus Austin, a trained medical scribe. The creation of this record is based on the scribe's personal observations and the provider's statements to them. This document has been checked and approved by the attending provider.    _____________________________________   Eppie Gibson, MD

## 2014-12-17 NOTE — Progress Notes (Addendum)
HDR BRACHYTHERAPY  DIAGNOSIS:     ICD-9-CM ICD-10-CM   1. Endometrial cancer 182.0 C54.1     NARRATIVE: After planning was complete the patient was transferred to the high-dose-rate suite. She was placed in the supine position. The patient's custom vaginal cylinder was placed within the vaginal vault. This was affixed to the CT/MR stabilization plate to prevent slippage.  Verification simulation  A fiducial marker was placed within the vaginal cylinder. Patient then had an AP and lateral film obtained. This was compared to the patient's planning films showing accurate position of the vaginal cylinder for treatment.  High-dose-rate treatment  The remote afterloading catheter was affixed to the vaginal cylinder. The patient then proceeded to undergo her 1st high-dose-rate treatment. She was prescribed a dose of 6 Gy to be delivered to 0.1cm depth below the proximal vaginal mucosal surface. This was achieved with a 3 cm diameter cylinder with a treatment length of 4.5 cm, 10 dwell positions. Total treatment time was 336.1 seconds. Patient tolerated the procedure well. After completion of her therapy a radiation survey was performed documenting return of the iridium source into the GammaMed safe.  -----------------------------------  Eppie Gibson, MD

## 2014-12-21 ENCOUNTER — Telehealth: Payer: Self-pay | Admitting: *Deleted

## 2014-12-21 NOTE — Telephone Encounter (Signed)
Called patient to remind of HDR Tx. For 12-22-14 @ 9 am , lvm for a return call

## 2014-12-22 ENCOUNTER — Ambulatory Visit
Admission: RE | Admit: 2014-12-22 | Discharge: 2014-12-22 | Disposition: A | Discharge status: Home / Self Care | Payer: Commercial Managed Care - HMO | Source: Ambulatory Visit | Attending: Radiation Oncology | Admitting: Radiation Oncology

## 2014-12-22 ENCOUNTER — Other Ambulatory Visit: Payer: Self-pay | Admitting: Oncology

## 2014-12-22 DIAGNOSIS — C541 Malignant neoplasm of endometrium: Secondary | ICD-10-CM

## 2014-12-22 NOTE — Progress Notes (Signed)
HDR BRACHYTHERAPY  DIAGNOSIS:     ICD-9-CM ICD-10-CM   1. Endometrial cancer 182.0 C54.1     NARRATIVE: After planning was complete the patient was transferred to the high-dose-rate suite. She was placed in the supine position. The patient's custom vaginal cylinder was placed within the vaginal vault. This was affixed to the CT/MR stabilization plate to prevent slippage.  Verification simulation  A fiducial marker was placed within the vaginal cylinder. Patient then had an AP and lateral film obtained. This was compared to the patient's planning films showing accurate position of the vaginal cylinder for treatment.  High-dose-rate treatment  The remote afterloading catheter was affixed to the vaginal cylinder. The patient then proceeded to undergo her 2nd high-dose-rate treatment. She was prescribed a dose of 6 Gy to be delivered to 0.1cm depth below the proximal vaginal mucosal surface. This was achieved with a 3 cm diameter cylinder with a treatment length of 4.5 cm, 10 dwell positions. Total treatment time was 352.0 seconds. Patient tolerated the procedure well. After completion of her therapy a radiation survey was performed documenting return of the iridium source into the GammaMed safe.  This document serves as a record of services personally performed by Eppie Gibson, MD. It was created on her behalf by Lenn Cal, a trained medical scribe. The creation of this record is based on the scribe's personal observations and the provider's statements to them. This document has been checked and approved by the attending provider.   -----------------------------------  Eppie Gibson, MD

## 2014-12-23 ENCOUNTER — Ambulatory Visit (HOSPITAL_BASED_OUTPATIENT_CLINIC_OR_DEPARTMENT_OTHER): Payer: Commercial Managed Care - HMO | Admitting: Oncology

## 2014-12-23 ENCOUNTER — Other Ambulatory Visit: Payer: Commercial Managed Care - HMO

## 2014-12-23 ENCOUNTER — Encounter: Payer: Self-pay | Admitting: Oncology

## 2014-12-23 ENCOUNTER — Other Ambulatory Visit (HOSPITAL_BASED_OUTPATIENT_CLINIC_OR_DEPARTMENT_OTHER): Payer: Commercial Managed Care - HMO

## 2014-12-23 ENCOUNTER — Ambulatory Visit (HOSPITAL_BASED_OUTPATIENT_CLINIC_OR_DEPARTMENT_OTHER): Payer: Commercial Managed Care - HMO

## 2014-12-23 ENCOUNTER — Telehealth: Payer: Self-pay | Admitting: Oncology

## 2014-12-23 VITALS — BP 135/70 | HR 100 | Temp 98.6°F | Resp 18 | Ht 62.0 in | Wt 237.6 lb

## 2014-12-23 DIAGNOSIS — Z95828 Presence of other vascular implants and grafts: Secondary | ICD-10-CM

## 2014-12-23 DIAGNOSIS — C541 Malignant neoplasm of endometrium: Secondary | ICD-10-CM | POA: Diagnosis not present

## 2014-12-23 DIAGNOSIS — Z6841 Body Mass Index (BMI) 40.0 and over, adult: Secondary | ICD-10-CM

## 2014-12-23 DIAGNOSIS — G620 Drug-induced polyneuropathy: Secondary | ICD-10-CM | POA: Diagnosis not present

## 2014-12-23 DIAGNOSIS — T451X5A Adverse effect of antineoplastic and immunosuppressive drugs, initial encounter: Secondary | ICD-10-CM

## 2014-12-23 DIAGNOSIS — I878 Other specified disorders of veins: Secondary | ICD-10-CM

## 2014-12-23 DIAGNOSIS — D6481 Anemia due to antineoplastic chemotherapy: Secondary | ICD-10-CM | POA: Diagnosis not present

## 2014-12-23 LAB — CBC WITH DIFFERENTIAL/PLATELET
BASO%: 0.5 % (ref 0.0–2.0)
BASOS ABS: 0 10*3/uL (ref 0.0–0.1)
EOS%: 1 % (ref 0.0–7.0)
Eosinophils Absolute: 0 10*3/uL (ref 0.0–0.5)
HCT: 31.1 % — ABNORMAL LOW (ref 34.8–46.6)
HEMOGLOBIN: 9.6 g/dL — AB (ref 11.6–15.9)
LYMPH#: 1.4 10*3/uL (ref 0.9–3.3)
LYMPH%: 34.5 % (ref 14.0–49.7)
MCH: 22.1 pg — AB (ref 25.1–34.0)
MCHC: 30.9 g/dL — AB (ref 31.5–36.0)
MCV: 71.5 fL — ABNORMAL LOW (ref 79.5–101.0)
MONO#: 0.3 10*3/uL (ref 0.1–0.9)
MONO%: 8.6 % (ref 0.0–14.0)
NEUT%: 55.4 % (ref 38.4–76.8)
NEUTROS ABS: 2.2 10*3/uL (ref 1.5–6.5)
NRBC: 0 % (ref 0–0)
Platelets: 155 10*3/uL (ref 145–400)
RBC: 4.35 10*6/uL (ref 3.70–5.45)
RDW: 18.7 % — ABNORMAL HIGH (ref 11.2–14.5)
WBC: 4 10*3/uL (ref 3.9–10.3)

## 2014-12-23 LAB — COMPREHENSIVE METABOLIC PANEL (CC13)
ALBUMIN: 3.2 g/dL — AB (ref 3.5–5.0)
ALT: 19 U/L (ref 0–55)
AST: 17 U/L (ref 5–34)
Alkaline Phosphatase: 75 U/L (ref 40–150)
Anion Gap: 9 mEq/L (ref 3–11)
BILIRUBIN TOTAL: 0.27 mg/dL (ref 0.20–1.20)
BUN: 10.8 mg/dL (ref 7.0–26.0)
CALCIUM: 8.8 mg/dL (ref 8.4–10.4)
CO2: 25 mEq/L (ref 22–29)
Chloride: 108 mEq/L (ref 98–109)
Creatinine: 0.7 mg/dL (ref 0.6–1.1)
EGFR: >90 mL/min/{1.73_m2} (ref >90)
Glucose: 122 mg/dl (ref 70–140)
Potassium: 3.2 mEq/L — ABNORMAL LOW (ref 3.5–5.1)
Sodium: 143 mEq/L (ref 136–145)
TOTAL PROTEIN: 6.4 g/dL (ref 6.4–8.3)

## 2014-12-23 LAB — TECHNOLOGIST REVIEW

## 2014-12-23 MED ORDER — SODIUM CHLORIDE 0.9 % IJ SOLN
10.0000 mL | INTRAMUSCULAR | Status: DC | PRN
  Administered 2014-12-23: 10 mL via INTRAVENOUS
  Filled 2014-12-23: qty 10

## 2014-12-23 MED ORDER — HEPARIN SOD (PORK) LOCK FLUSH 100 UNIT/ML IV SOLN
500.0000 [IU] | Freq: Once | INTRAVENOUS | Status: AC
  Administered 2014-12-23: 500 [IU] via INTRAVENOUS
  Filled 2014-12-23: qty 5

## 2014-12-23 NOTE — Patient Instructions (Signed)

## 2014-12-23 NOTE — Telephone Encounter (Signed)
Appointments made and avs printed for patient °

## 2014-12-23 NOTE — Progress Notes (Signed)
OFFICE PROGRESS NOTE   December 23, 2014   Physicians:Emma Rossi/ Janie Morning, Mosie Lukes, MD,Marie-Lynn Inez Pilgrim; Suella Broad, J.Bardelas, Jari Pigg, Verl Blalock Also followed by [facility] outreach RN  INTERVAL HISTORY:  Patient is seen, together with husband, in follow up of recently completed adjuvant chemotherapy for IA grade 3 serous/ endometrioid carcinoma of uterus. HDR is in process, with 5 treatments planned thru 01-03-15. She will need scans prior to return visit to Dr Skeet Latch on 03-03-15 (no preoperative scans done).   Patient had cycle 6 carboplatin taxol on 12-06-14, with granix support. She did not have any different problems with the last chemotherapy, vomited x1 just after treatment then controlled with prn antiemetics. She is no longer having any nausea or vomiting. She has some numbness in fingers, worse when hands are cold, and "sand under feet"; we have discussed lots of movement and massage, and hopefully the neuropathy symptoms will gradually improve out further from chemo. Bowels are moving regularly. She is walking a mile daily counting all of activity, using Fit Bit. Bowels are moving well, no diarrhea. She is tolerating the vaginal brachyherapy without difficulty.    PAC in. Peripheral IV access extremely difficult, so will leave in for now. Flu vaccine done Genetics testing sent 11-29-14  [facility] home nurse sees her monthly, follows BP etc, thru [employer].  ONCOLOGIC HISTORY Patient presented to Dr Dellis Filbert with new onset postmenopausal spotting. [place] 05-07-14 reportedly had thickened endometrial stripe, with 3 fibroids largest 5x 5x 3.5 cm; endometrial biopsy 05-07-14 reportedly was concerning for at least FIGO grade 1 endometrial carcinoma. She had consultation with Dr Denman George on 05-20-14. Surgery was robotic hysterectomy, BSO, bilateral pelvic and para-aortic nodes by Dr Skeet Latch at [facility] on 06-23-14. Patient did well with surgery, discharged home on POD  #1. Pathology [phone]) found mixed high grade carcinoma (serous 50% and endometrioid 50%) FIGO grade 3, involving inner half of myometrium with depth 5 mm where wall 3 cm thick, no serosal or lower uterine segment involvement, no cervical or adnexal involvement, LVSI present, 0/15 nodes involved. PET/ CT at [facility] 07-30-14 had no findings of concern for distant disease. [facility] multidisciplinary conference recommended 6 cycles of taxane platinum chemotherapy followed by vaginal brachytherapy, and for genetics counseling.  Cycle 1 carboplatin taxol was given 2-08-24-14, with neutropenia day 10, granix added. Initial severe nausea controlled with addition of EMEND and aches controlled with claritin. She completed 6 cycles of carboplatin taxol on 12-06-14, with granix support. HDR x 5 is planned thru 01-03-15.       Review of systems as above, also: No fever or symptoms of infection. No bleeding. No LE swelling. Right knee much better since injection by Dr Nelva Bush, however now some positional discomort lateral lower leg worse when she lies on that side.  Remainder of 10 point Review of Systems negative.  Objective:  Vital signs in last 24 hours:  BP 135/70 mmHg  Pulse 100  Temp(Src) 98.6 F (37 C) (Oral)  Resp 18  Ht 5' 2"  (1.575 m)  Wt 237 lb 9.6 oz (107.775 kg)  BMI 43.45 kg/m2  SpO2 100% Weight up 2 lbs. Alert, oriented and appropriate. Ambulatory without difficulty, needs a little assistance on and off exam table..  Total alopecia  HEENT:PERRL, sclerae not icteric. Oral mucosa moist without lesions, posterior pharynx clear.  Neck supple. No JVD.  Lymphatics:no cervical,supraclavicular, axillary or inguinal adenopathy Resp: clear to auscultation bilaterally and normal percussion bilaterally Cardio: regular rate and rhythm. No  gallop. GI: abdomen obese, soft, nontender, not distended, no appreciable mass or organomegaly. Normally active bowel sounds. Surgical incisions not  remarkable. Musculoskeletal/ Extremities: without pitting edema, cords, tenderness Neuro: mild peripheral neuropathy fingers and soles of feet. Otherwise nonfocal. Psych appropriate mood and affect Skin without rash including LE ecchymosis, petechiae Portacath-without erythema or tenderness  Lab Results:  Results for orders placed or performed in visit on 12/23/14  TECHNOLOGIST REVIEW  Result Value Ref Range   Technologist Review      few teardrop RBCs; few ovalocytes, sl polychromasia  CBC with Differential  Result Value Ref Range   WBC 4.0 3.9 - 10.3 10e3/uL   NEUT# 2.2 1.5 - 6.5 10e3/uL   HGB 9.6 (L) 11.6 - 15.9 g/dL   HCT 31.1 (L) 34.8 - 46.6 %   Platelets 155 Large platelets present 145 - 400 10e3/uL   MCV 71.5 (L) 79.5 - 101.0 fL   MCH 22.1 (L) 25.1 - 34.0 pg   MCHC 30.9 (L) 31.5 - 36.0 g/dL   RBC 4.35 3.70 - 5.45 10e6/uL   RDW 18.7 (H) 11.2 - 14.5 %   lymph# 1.4 0.9 - 3.3 10e3/uL   MONO# 0.3 0.1 - 0.9 10e3/uL   Eosinophils Absolute 0.0 0.0 - 0.5 10e3/uL   Basophils Absolute 0.0 0.0 - 0.1 10e3/uL   NEUT% 55.4 38.4 - 76.8 %   LYMPH% 34.5 14.0 - 49.7 %   MONO% 8.6 0.0 - 14.0 %   EOS% 1.0 0.0 - 7.0 %   BASO% 0.5 0.0 - 2.0 %   nRBC 0 0 - 0 %  Comprehensive metabolic panel (Cmet) - [facility]  Result Value Ref Range   Sodium 143 136 - 145 mEq/L   Potassium 3.2 (L) 3.5 - 5.1 mEq/L   Chloride 108 98 - 109 mEq/L   CO2 25 22 - 29 mEq/L   Glucose 122 70 - 140 mg/dl   BUN 10.8 7.0 - 26.0 mg/dL   Creatinine 0.7 0.6 - 1.1 mg/dL   Total Bilirubin 0.27 0.20 - 1.20 mg/dL   Alkaline Phosphatase 75 40 - 150 U/L   AST 17 5 - 34 U/L   ALT 19 0 - 55 U/L   Total Protein 6.4 6.4 - 8.3 g/dL   Albumin 3.2 (L) 3.5 - 5.0 g/dL   Calcium 8.8 8.4 - 10.4 mg/dL   Anion Gap 9 3 - 11 mEq/L   EGFR >90 >90 ml/min/1.73 m2     Studies/Results:  No results found. CT AP shortly prior to seeing Dr Skeet Latch 03-03-15  Medications: I have reviewed the patient's current medications. Continue Hemocyte,  on empty stomach with OJ  DISCUSSION: Encouraged gradual increase in activity and watching diet; discussed goal of 5000 steps daily above present baseline. She is aware that PAC can be used for CT, and will need to be flushed every 6-8 weeks when not otherwise used. Discussed chemotherapy peripheral neuropathy, as above. Doubt symptoms in lateral leg are related to chemo, may be referred from hip/knee or low back; she can contact Dr Nelva Bush if continues.   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 Genetics testing pending. Dr Skeet Latch to see in August, CT prior. I will see her in Sept coordinating with PAC flush. 2.chemo anemia: continue oral iron. Follow up labs coordinating with PAC flush. 3.PAC in: flush every  6-8 weeks when not otherwise used. Peripheral IV access extremely difficult, so leave this for now 4.arthritis LE known to Dr Nelva Bush 5.obesity, BMI 43.5. She enjoys walking and using FitBit, encouraged. Would be best from cancer and orthopedic standpoint to lose weight to ideal 6.asthma intermittent: no symptoms now, continuing usual medications 7.past colon polyps, hx diverticulitis, GERD 8.chemo peripheral neuropathy fingers and feet: as above 9.post cholecystectomy 10.hx benign positional vertigo. HTN followed by PCP    All questions answered. Patient is in agreement with recommendations and plans above.Time spent 25 min including >50% counseling and coordination of care.    Huan Pollok P, MD   12/23/2014, 9:33 AM

## 2014-12-24 ENCOUNTER — Encounter: Payer: Self-pay | Admitting: Oncology

## 2014-12-24 ENCOUNTER — Telehealth: Payer: Self-pay | Admitting: *Deleted

## 2014-12-24 ENCOUNTER — Encounter: Payer: Self-pay | Admitting: Radiation Oncology

## 2014-12-24 DIAGNOSIS — D6481 Anemia due to antineoplastic chemotherapy: Secondary | ICD-10-CM | POA: Insufficient documentation

## 2014-12-24 DIAGNOSIS — I878 Other specified disorders of veins: Secondary | ICD-10-CM | POA: Insufficient documentation

## 2014-12-24 DIAGNOSIS — T451X5A Adverse effect of antineoplastic and immunosuppressive drugs, initial encounter: Secondary | ICD-10-CM

## 2014-12-24 NOTE — Telephone Encounter (Signed)
CALLED PATIENT TO REMIND OF HDR TX. FOR 12-27-14 @ 9 AM, LVM FOR A RETURN CALL

## 2014-12-24 NOTE — Progress Notes (Signed)
[place] END OF TREATMENT   Name: Katelyn Fisher Date: December 24, 2014  MRN: [id number] DOB: [birth year]   TREATMENT DATES: 08-24-2014 thru 12-06-14   REFERRING PHYSICIAN: Janie Morning  DIAGNOSIS: grade 3 serous/ endometrioid uterine carcinoma   STAGE AT START OF TREATMENT: IA   INTENT: curative   DRUGS OR REGIMENS GIVEN: carboplatin taxol x 6 cycles   MAJOR TOXICITIES: nausea (improved with addition of EMEND), chemo anemia, chemo neutropenia, chemo peripheral neuropathy  REASON TREATMENT STOPPED: completion of planned course   PERFORMANCE STATUS AT END: 1   ONGOING PROBLEMS: anemia, peripheral neuropathy   FOLLOW UP PLANS: completion of vaginal brachytherapy. Follow up CT and gyn oncology re-evaluation. Follow up blood counts. Medical oncology, radiation oncology follow up

## 2014-12-27 ENCOUNTER — Ambulatory Visit
Admission: RE | Admit: 2014-12-27 | Discharge: 2014-12-27 | Disposition: A | Discharge status: Home / Self Care | Payer: Commercial Managed Care - HMO | Source: Ambulatory Visit | Attending: Radiation Oncology | Admitting: Radiation Oncology

## 2014-12-27 DIAGNOSIS — C541 Malignant neoplasm of endometrium: Secondary | ICD-10-CM

## 2014-12-27 NOTE — Progress Notes (Signed)
HDR BRACHYTHERAPY  DIAGNOSIS:     ICD-9-CM ICD-10-CM   1. Endometrial cancer 182.0 C54.1     NARRATIVE: After planning was complete the patient was transferred to the high-dose-rate suite. She was placed in the supine position. The patient's custom vaginal cylinder was placed within the vaginal vault. This was affixed to the CT/MR stabilization plate to prevent slippage.  Verification simulation  A fiducial marker was placed within the vaginal cylinder. Patient then had an AP and lateral film obtained. This was compared to the patient's planning films showing accurate position of the vaginal cylinder for treatment.  High-dose-rate treatment  The remote afterloading catheter was affixed to the vaginal cylinder. The patient then proceeded to undergo her 3rd high-dose-rate treatment. She was prescribed a dose of 6 Gy to be delivered to 0.1cm depth below the proximal vaginal mucosal surface. This was achieved with a 3 cm diameter cylinder with a treatment length of 4.5 cm, 10 dwell positions. Total treatment time was 369 seconds. Patient tolerated the procedure well. After completion of her therapy a radiation survey was performed documenting return of the iridium source into the GammaMed safe.  -----------------------------------  Eppie Gibson, MD

## 2014-12-29 ENCOUNTER — Other Ambulatory Visit: Payer: Self-pay | Admitting: *Deleted

## 2014-12-29 ENCOUNTER — Encounter: Payer: Self-pay | Admitting: Radiation Oncology

## 2014-12-29 NOTE — Patient Outreach (Signed)
[name]) Care Management   12/29/2014  Katelyn Fisher October 07, 1950 [id number]  Katelyn Fisher is an 64 y.o. female  Subjective:   HTN: Pt reports she is doing well and continues to monitor her BP and document all readings. Denies any signs or symptoms or issues related to hypo-hypertension. Pt reports she continues to do well with no reported problems however continues to have issues with her right knee.  Pt reports she is taking all her prescribed medications with no reported issues.  EDEMA:  Pt reports she is wearing her compression stockings and recently applied her knee compression with stabilizer to assist with ambulation. Pt states feels much better and safer when ambulating.      Objective:   Review of Systems  Constitutional: Negative.   HENT: Negative.   Eyes: Negative.   Respiratory: Negative.   Cardiovascular: Negative.   Gastrointestinal: Negative.   Genitourinary: Negative.   Musculoskeletal: Negative.   Skin: Negative.   Neurological: Negative.   Endo/Heme/Allergies: Negative.   Psychiatric/Behavioral: Negative.     Physical Exam  Constitutional: She is oriented to person, place, and time. She appears well-developed and well-nourished.  HENT:  Right Ear: External ear normal.  Left Ear: External ear normal.  Nose: Nose normal.  Neck: Normal range of motion.  Cardiovascular: Normal rate and normal heart sounds.   Respiratory: Effort normal and breath sounds normal.  GI: Soft. Bowel sounds are normal.  Musculoskeletal: Normal range of motion.  Neurological: She is alert and oriented to person, place, and time.  Skin: Skin is warm and dry.  Psychiatric: She has a normal mood and affect. Her behavior is normal. Judgment and thought content normal.    Current Medications:   Current Outpatient Prescriptions  Medication Sig Dispense Refill  . albuterol (PROAIR HFA) 108 (90 BASE) MCG/ACT inhaler Inhale 2 puffs into the lungs 2 (two) times  daily as needed. 3 Inhaler 2  . albuterol (PROVENTIL) (2.5 MG/3ML) 0.083% nebulizer solution Take 3 mLs (2.5 mg total) by nebulization every 6 (six) hours as needed for wheezing or shortness of breath. 150 mL 1  . amLODipine (NORVASC) 10 MG tablet Take 1 tablet (10 mg total) by mouth daily. 90 tablet 2  . aspirin 81 MG tablet Take 81 mg by mouth daily.      Marland Kitchen atorvastatin (LIPITOR) 20 MG tablet Take 10 mg by mouth daily.    . benazepril (LOTENSIN) 40 MG tablet Take 1 tablet (40 mg total) by mouth daily. 90 tablet 2  . budesonide-formoterol (SYMBICORT) 160-4.5 MCG/ACT inhaler Inhale 2 puffs into the lungs 2 (two) times daily. 3 Inhaler 2  . dexamethasone (DECADRON) 4 MG tablet Take 5 tabs with food (=20mg ) 12 hrs prior to chemo and 6 hrs with food prior to chemo 30 tablet 0  . diclofenac sodium (VOLTAREN) 1 % GEL Apply 2 g topically 4 (four) times daily as needed.     . diphenhydramine-acetaminophen (TYLENOL PM) 25-500 MG TABS Take 1 tablet by mouth at bedtime as needed.    . ferrous fumarate (HEMOCYTE) 325 (106 FE) MG TABS tablet Take 1 tablet daily on an empty stomach with OJ. 30 each 4  . fluticasone (FLONASE) 50 MCG/ACT nasal spray Place 2 sprays into the nose daily. 16 g 0  . furosemide (LASIX) 20 MG tablet Take 1 tablet (20 mg total) by mouth daily. 90 tablet 0  . HYDROcodone-homatropine (HYCODAN) 5-1.5 MG/5ML syrup Take 5 mLs by mouth every 8 (eight) hours as  needed for cough. 120 mL 0  . levothyroxine (SYNTHROID, LEVOTHROID) 25 MCG tablet Take 1 tablet (25 mcg total) by mouth as directed. 1 tab daily except on Saturdays take 2 tabs 102 tablet 2  . lidocaine-prilocaine (EMLA) cream Apply 1-2 hrs to [facility] cath site  prior access as directed. 30 g 1  . LORazepam (ATIVAN) 1 MG tablet Place 1 tablet under the tongue or swallow every 6 hours as needed for nausea.  Will make you drowsy. 20 tablet 0  . meloxicam (MOBIC) 15 MG tablet Take 15 mg by mouth daily as needed.     . Multiple Vitamin  (MULTIVITAMIN) tablet Take 1 tablet by mouth daily.    . nebivolol (BYSTOLIC) 10 MG tablet Take 1 tablet (10 mg total) by mouth 2 (two) times daily. 180 tablet 2  . ondansetron (ZOFRAN) 8 MG tablet Take 1 tablet (8 mg total) by mouth every 8 (eight) hours as needed for nausea or vomiting. Will  not make drowsy. 30 tablet 01  . pantoprazole (PROTONIX) 40 MG tablet Take 1 tablet (40 mg total) by mouth daily. 90 tablet 2  . pentoxifylline (TRENTAL) 400 MG CR tablet Take 400 mg by mouth 2 (two) times daily.     . Probiotic Product (ALIGN) 4 MG CAPS Take 1 capsule by mouth daily.     . sennosides-docusate sodium (SENOKOT-S) 8.6-50 MG tablet Take 1 tablet by mouth daily as needed for constipation.    . traMADol (ULTRAM) 50 MG tablet Take 1-2 tablets by mouth as needed every 8 hours for pain. 30 tablet 0  . triamcinolone (KENALOG) 0.1 % cream Apply 1 application topically. Apply topically to affected area once daily      No current facility-administered medications for this visit.    Functional Status:   In your present state of health, do you have any difficulty performing the following activities: 11/25/2014 10/22/2014  Hearing? N N  Vision? N N  Difficulty concentrating or making decisions? N N  Walking or climbing stairs? N N  Dressing or bathing? N N  Doing errands, shopping? Y N  Preparing Food and eating ? N N  Using the Toilet? N N  In the past six months, have you accidently leaked urine? N N  Do you have problems with loss of bowel control? N N  Managing your Medications? N N  Managing your Finances? N N  Housekeeping or managing your Housekeeping? Y N    Fall/Depression Screening:    PHQ 2/9 Scores 12/29/2014 11/25/2014 10/22/2014  PHQ - 2 Score 0 0 0    Assessment:  Ongoing case management related to HTN Ongoing edema related to swelling to lower legs and knee   Plan:  Discussed ongoing plan of care and goals ongoing and confirmed pt's continues to have an understanding in managing  her HTN. Pt aware of hypo-hypertension signs or symptoms and what to do if acute symptoms should occur.  Will reiterate on symptoms and allow pt's teach-back method concerning such signs and symptoms. Verified pt taking all prescribed medications with no problems.  Encouraged pt to continue to elevated her lower legs and adhere to wearing her compression stocking and knee compression as needed to reduce ongoing swelling and prevent fall and/or injuries from her right knee. Plan to follow up in one month with ongoing case management services and re-evaluate her plan of care and goals accordingly. No inquires or questions as pt continue to manager her care independently. Discussed possible discharge in  few months based upon pt's progress.  Raina Mina, RN Care Management Coordinator [facility] Main Office [phone]

## 2014-12-30 ENCOUNTER — Telehealth: Payer: Self-pay | Admitting: *Deleted

## 2014-12-30 NOTE — Telephone Encounter (Signed)
TC from patient stating that the pain pain in her right leg is getting worse and she is not getting any relief using current meds. She does have an appt with Dr. Nelva Bush on the 23rd but is wondering if Dr. Marko Plume can do anything to help move that date up by calling Dr. Nelva Bush.  She doesn't think she can tolerate the pain that long.

## 2014-12-30 NOTE — Telephone Encounter (Signed)
Pt. States no swelling, just pain. She is waiting for cancellation for earlier appt. Advised her to call PCP also.

## 2014-12-30 NOTE — Telephone Encounter (Signed)
Re triage note about leg pain today: If any swelling in the leg, she needs venous doppler. If pain but no swelling, patient should call Dr Nelva Bush' office in case they have cancellation earlier, or call her PCP.  Thanks

## 2014-12-31 ENCOUNTER — Ambulatory Visit
Admission: RE | Admit: 2014-12-31 | Discharge: 2014-12-31 | Disposition: A | Discharge status: Home / Self Care | Payer: Commercial Managed Care - HMO | Source: Ambulatory Visit | Attending: Radiation Oncology | Admitting: Radiation Oncology

## 2014-12-31 DIAGNOSIS — C541 Malignant neoplasm of endometrium: Secondary | ICD-10-CM

## 2014-12-31 NOTE — Progress Notes (Signed)
HDR BRACHYTHERAPY  DIAGNOSIS:     ICD-9-CM ICD-10-CM   1. Endometrial cancer 182.0 C54.1     NARRATIVE: After planning was complete the patient was transferred to the high-dose-rate suite. She was placed in the supine position. The patient's custom vaginal cylinder was placed within the vaginal vault. This was affixed to the CT/MR stabilization plate to prevent slippage.  Verification simulation  A fiducial marker was placed within the vaginal cylinder. Patient then had an AP and lateral film obtained. This was compared to the patient's planning films showing accurate position of the vaginal cylinder for treatment.  High-dose-rate treatment  The remote afterloading catheter was affixed to the vaginal cylinder. The patient then proceeded to undergo her 4th high-dose-rate treatment. She was prescribed a dose of 6 Gy to be delivered to 0.1cm depth below the proximal vaginal mucosal surface. This was achieved with a 3 cm diameter cylinder with a treatment length of 4.5 cm, 10 dwell positions. Total treatment time was 383.4 seconds. Patient tolerated the procedure well. After completion of her therapy a radiation survey was performed documenting return of the iridium source into the [employer] safe.  This document serves as a record of services personally performed by Eppie Gibson, MD. It was created on her behalf by Darcus Austin, a trained medical scribe. The creation of this record is based on the scribe's personal observations and the provider's statements to them. This document has been checked and approved by the attending provider.     -----------------------------------  Eppie Gibson, MD

## 2015-01-03 ENCOUNTER — Encounter: Payer: Self-pay | Admitting: Radiation Oncology

## 2015-01-03 ENCOUNTER — Ambulatory Visit
Admission: RE | Admit: 2015-01-03 | Discharge: 2015-01-03 | Disposition: A | Discharge status: Home / Self Care | Payer: Commercial Managed Care - HMO | Source: Ambulatory Visit | Attending: Radiation Oncology | Admitting: Radiation Oncology

## 2015-01-03 DIAGNOSIS — C541 Malignant neoplasm of endometrium: Secondary | ICD-10-CM | POA: Diagnosis not present

## 2015-01-03 NOTE — Progress Notes (Signed)
HDR BRACHYTHERAPY  DIAGNOSIS:     ICD-9-CM ICD-10-CM   1. Endometrial cancer 182.0 C54.1     NARRATIVE: After planning was complete the patient was transferred to the high-dose-rate suite. She was placed in the supine position. The patient's custom vaginal cylinder was placed within the vaginal vault. This was affixed to the CT/MR stabilization plate to prevent slippage.  Verification simulation  A fiducial marker was placed within the vaginal cylinder. Patient then had an AP and lateral film obtained. This was compared to the patient's planning films showing accurate position of the vaginal cylinder for treatment.  High-dose-rate treatment  The remote afterloading catheter was affixed to the vaginal cylinder. The patient then proceeded to undergo her 5th and final high-dose-rate treatment. She was prescribed a dose of 6 Gy to be delivered to 0.1cm depth below the proximal vaginal mucosal surface. This was achieved with a 3 cm diameter cylinder with a treatment length of 4.5 cm, 10 dwell positions. Total treatment time was 394.2 seconds. Patient tolerated the procedure well. After completion of her therapy a radiation survey was performed documenting return of the iridium source into the GammaMed safe.  Plan: Dilators given; f/u in 58mo  This document serves as a record of services personally performed by Eppie Gibson, MD. It was created on her behalf by Arlyce Harman, a trained medical scribe. The creation of this record is based on the scribe's personal observations and the provider's statements to them. This document has been checked and approved by the attending provider.  -----------------------------------  Eppie Gibson, MD

## 2015-01-03 NOTE — Progress Notes (Signed)
  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     Plan: The patient has completed radiation treatment. The patient will return to radiation oncology clinic for routine followup in one month. I advised them to call or return sooner if they have any questions or concerns related to their recovery or treatment.   -----------------------------------  Eppie Gibson, MD

## 2015-01-05 ENCOUNTER — Telehealth: Payer: Self-pay

## 2015-01-05 NOTE — Telephone Encounter (Addendum)
Received request for Tramadol refill. Spoke with Katelyn Fisher and she has seen Katelyn Fisher recently for leg pain and PA-C today  and received prednisone. Told her that Katelyn Fisher or PCP Dr. Randel Pigg should be managing the pain meds for her leg pain.  Katelyn Fisher verbalized understanding and will call Katelyn Fisher' office. Notified [facility] of no refill from this office.  They will send to Katelyn Fisher' office.

## 2015-01-06 ENCOUNTER — Telehealth: Payer: Self-pay | Admitting: Nurse Practitioner

## 2015-01-06 ENCOUNTER — Telehealth: Payer: Self-pay | Admitting: Genetic Counselor

## 2015-01-06 ENCOUNTER — Other Ambulatory Visit: Payer: Self-pay | Admitting: Oncology

## 2015-01-06 ENCOUNTER — Encounter: Payer: Self-pay | Admitting: Genetic Counselor

## 2015-01-06 DIAGNOSIS — Z8041 Family history of malignant neoplasm of ovary: Secondary | ICD-10-CM

## 2015-01-06 DIAGNOSIS — C541 Malignant neoplasm of endometrium: Secondary | ICD-10-CM

## 2015-01-06 DIAGNOSIS — I878 Other specified disorders of veins: Secondary | ICD-10-CM

## 2015-01-06 DIAGNOSIS — Z1379 Encounter for other screening for genetic and chromosomal anomalies: Secondary | ICD-10-CM

## 2015-01-06 DIAGNOSIS — Z803 Family history of malignant neoplasm of breast: Secondary | ICD-10-CM

## 2015-01-06 DIAGNOSIS — Z8042 Family history of malignant neoplasm of prostate: Secondary | ICD-10-CM

## 2015-01-06 DIAGNOSIS — Z800 Family history of malignant neoplasm of digestive organs: Secondary | ICD-10-CM

## 2015-01-06 NOTE — Telephone Encounter (Signed)
Left good news message on VM.  Asked that she call back.

## 2015-01-06 NOTE — Telephone Encounter (Signed)
Per Dr. Marko Plume, [place] to cancel lab appointment on 01/07/15 due to last labs drawn 12/23/14 with PAC flush. Patient is grateful and thanks for the call. She is asking if this means she is cancer free.  RN conveyed that I don't have that information to give her but did review upcoming f/u appointments and assured her that med onc, rad onc, and surgical onc are keeping close follow up to manager her care. RN informed her that if she desires the details of her diagnosis and oncologic status, I can arrange for her to speak directly to her physician. Patient states she doesn't need to at this time because she believes to be cancer free and is encouraged because "Dr. Skeet Latch told me they didn't find any more cancer and the chemo and radiation were for prevention." I assured her we are following her case closely and she should call if she has any new questions or concerns. Patient verbalizes understanding and thanks for the call.

## 2015-01-06 NOTE — Progress Notes (Signed)
HPI: [name] was previously seen in the [place] clinic due to a personal and family history of cancer and concerns regarding a hereditary predisposition to cancer. Please refer to our prior cancer genetics clinic note for more information regarding Katelyn Fisher's medical, social and family histories, and our assessment and recommendations, at the time. Katelyn Fisher recent genetic test results were disclosed to her, as were recommendations warranted by these results. These results and recommendations are discussed in more detail below.  GENETIC TEST RESULTS: At the time of Katelyn Fisher's visit, we recommended she pursue genetic testing of the Comprehensive cancer gene panel. The Comprehensive Cancer Panel offered by [facility] includes sequencing and/or deletion duplication testing of the following 32 genes: APC, ATM, AXIN2, BARD1, BMPR1A, BRCA1, BRCA2, BRIP1, CDH1, CDK4, CDKN2A, CHEK2, EPCAM, FANCC, MLH1, MSH2, MSH6, MUTYH, NBN, PALB2, PMS2, POLD1, POLE, PTEN, RAD51C, RAD51D, SCG5/GREM1, SMAD4, STK11, TP53, VHL, and XRCC2.   The report date is Dec 15, 214.  Genetic testing was normal, and did not reveal a deleterious mutation in these genes. The test report has been scanned into EPIC and is located under the Media tab.   We discussed with Katelyn Fisher that since the current genetic testing is not perfect, it is possible there may be a gene mutation in one of these genes that current testing cannot detect, but that chance is small. We also discussed, that it is possible that another gene that has not yet been discovered, or that we have not yet tested, is responsible for the cancer diagnoses in the family, and it is, therefore, important to remain in touch with cancer genetics in the future so that we can continue to offer Katelyn Fisher the most up to date genetic testing.   CANCER SCREENING RECOMMENDATIONS: Given Katelyn Fisher's personal and family histories, we must interpret these negative  results with some caution.  Families with features suggestive of hereditary risk for cancer tend to have multiple family members with cancer, diagnoses in multiple generations and diagnoses before the age of 69. Katelyn Fisher family exhibits some of these features. Thus this result may simply reflect our current inability to detect all mutations within these genes or there may be a different gene that has not yet been discovered or tested.   RECOMMENDATIONS FOR FAMILY MEMBERS: Women in this family might be at some increased risk of developing cancer, over the general population risk, simply due to the family history of cancer. We recommended women in this family have a yearly mammogram beginning at age 66, or 71 years younger than the earliest onset of cancer, an an annual clinical breast exam, and perform monthly breast self-exams. Women in this family should also have a gynecological exam as recommended by their primary provider. All family members should have a colonoscopy by age 19.  FOLLOW-UP: Lastly, we discussed with Katelyn Fisher that cancer genetics is a rapidly advancing field and it is possible that new genetic tests will be appropriate for her and/or her family members in the future. We encouraged her to remain in contact with cancer genetics on an annual basis so we can update her personal and family histories and let her know of advances in cancer genetics that may benefit this family.   Our contact number was provided. Katelyn Fisher questions were answered to her satisfaction, and she knows she is welcome to call us at anytime with additional questions or concerns.   Roma Kayser, MS, [facility] Certified Genetic Counselor [email or web address]

## 2015-01-06 NOTE — Telephone Encounter (Signed)
Revealed negative genetic testing.  Explained that we do not know why there is cancer in the family or why Katelyn Fisher developed cancer.  Explained that there could be a mutation that we are not able to pick up yet, or possilby there is another gene.  Asked that she keep in contact with [place] periodically to ensure that if there is updated testing we can let her know about it.

## 2015-01-07 ENCOUNTER — Telehealth: Payer: Self-pay | Admitting: Oncology

## 2015-01-07 ENCOUNTER — Other Ambulatory Visit: Payer: Commercial Managed Care - HMO

## 2015-01-07 NOTE — Telephone Encounter (Signed)
Lab only appointment for 6/17 cancelled per pof

## 2015-01-17 ENCOUNTER — Other Ambulatory Visit: Payer: Self-pay

## 2015-01-17 LAB — HM DIABETES EYE EXAM

## 2015-01-26 ENCOUNTER — Other Ambulatory Visit: Payer: Self-pay

## 2015-02-01 ENCOUNTER — Other Ambulatory Visit: Payer: Self-pay | Admitting: *Deleted

## 2015-02-01 ENCOUNTER — Telehealth: Payer: Self-pay | Admitting: *Deleted

## 2015-02-01 ENCOUNTER — Encounter: Payer: Self-pay | Admitting: *Deleted

## 2015-02-01 NOTE — Telephone Encounter (Signed)
patient called stating she has a cough and starting to have yellow phlegm, could Dr. Isidore Moos call in rx for this"?, NO fever stated, was seen at Dr. Gabriel Carina today and noted that lungs clear, in note, we treated her HDR cervicl, wouldn't cause a cough, suggested to try mucinex otc  Or cough syrup  and drink plenty water to thin secretions, if continues to  Have increasing congestion, or fever call her primary MD otherwise we will see her 02/11/15 or her follow up,patient thanked me ,said she would see Dr. Isidore Moos then 3:37 PM

## 2015-02-01 NOTE — Patient Outreach (Signed)
[facility]) Care Management   02/01/2015  Katelyn Fisher Dec 14, 1950 [id number]  Katelyn Fisher is an 64 y.o. female  Subjective:  HTN: Pt reports she continues to take her blood pressures and document all readings for her providers to view.  Denies any signs or symptoms of HTN and pt continues to be aware of such symptoms and what to do if acute.   EDEMA: Pt continues to wear her compression stockings and will consider additional compression stockings in the future if swelling continues to go down and will continue to elevate her legs when not wearing the stockings.   NUTRITIONAL: Pt inquired about healthy eating habits concerning several topics. Garlic, seasonings for her foods and healthy cookware (non-stick cooking pan). Also inquire on carbohydrates and supplements. RIGHT KNEE: Pt report ocassional nerve pain to her right lower leg pending assessment with a neurologist or orthopedic for possible remedy. Pt reports there has been discussion on possible injections however this has not be completed or pursued. Pt will intervene over the next month. Pt wears her knee brace to buffer the pain when needed.   Objective:   Review of Systems  Constitutional: Negative.   HENT: Negative.   Eyes: Negative.   Respiratory: Positive for cough.        Productive cough with discoloration (yellow).   Cardiovascular: Negative.   Gastrointestinal: Negative.   Genitourinary: Negative.   Musculoskeletal: Negative.   Skin: Negative.   Neurological: Negative.   Endo/Heme/Allergies: Negative.   Psychiatric/Behavioral: Negative.     Physical Exam  Constitutional: She is oriented to person, place, and time. She appears well-developed and well-nourished.  HENT:  Right Ear: External ear normal.  Left Ear: External ear normal.  Nose: Nose normal.  Neck: Normal range of motion.  Cardiovascular: Normal rate.   Respiratory: Effort normal and breath sounds normal.  GI: Soft. Bowel  sounds are normal.  Musculoskeletal: Normal range of motion.  Neurological: She is alert and oriented to person, place, and time.  Skin: Skin is warm and dry.  Psychiatric: Her behavior is normal. Judgment and thought content normal.    Current Medications:   Current Outpatient Prescriptions  Medication Sig Dispense Refill  . albuterol (PROAIR HFA) 108 (90 BASE) MCG/ACT inhaler Inhale 2 puffs into the lungs 2 (two) times daily as needed. 3 Inhaler 2  . albuterol (PROVENTIL) (2.5 MG/3ML) 0.083% nebulizer solution Take 3 mLs (2.5 mg total) by nebulization every 6 (six) hours as needed for wheezing or shortness of breath. 150 mL 1  . amLODipine (NORVASC) 10 MG tablet Take 1 tablet (10 mg total) by mouth daily. 90 tablet 2  . aspirin 81 MG tablet Take 81 mg by mouth daily.      Marland Kitchen atorvastatin (LIPITOR) 20 MG tablet Take 10 mg by mouth daily.    . benazepril (LOTENSIN) 40 MG tablet Take 1 tablet (40 mg total) by mouth daily. 90 tablet 2  . budesonide-formoterol (SYMBICORT) 160-4.5 MCG/ACT inhaler Inhale 2 puffs into the lungs 2 (two) times daily. 3 Inhaler 2  . diclofenac sodium (VOLTAREN) 1 % GEL Apply 2 g topically 4 (four) times daily as needed.     . diphenhydramine-acetaminophen (TYLENOL PM) 25-500 MG TABS Take 1 tablet by mouth at bedtime as needed.    . ferrous fumarate (HEMOCYTE) 325 (106 FE) MG TABS tablet Take 1 tablet daily on an empty stomach with OJ. 30 each 4  . fluticasone (FLONASE) 50 MCG/ACT nasal spray Place 2 sprays into  the nose daily. 16 g 0  . furosemide (LASIX) 20 MG tablet Take 1 tablet (20 mg total) by mouth daily. 90 tablet 0  . HYDROcodone-homatropine (HYCODAN) 5-1.5 MG/5ML syrup Take 5 mLs by mouth every 8 (eight) hours as needed for cough. 120 mL 0  . levothyroxine (SYNTHROID, LEVOTHROID) 25 MCG tablet Take 1 tablet (25 mcg total) by mouth as directed. 1 tab daily except on Saturdays take 2 tabs 102 tablet 2  . LORazepam (ATIVAN) 1 MG tablet Place 1 tablet under the  tongue or swallow every 6 hours as needed for nausea.  Will make you drowsy. 20 tablet 0  . meloxicam (MOBIC) 15 MG tablet Take 15 mg by mouth daily as needed.     . Multiple Vitamin (MULTIVITAMIN) tablet Take 1 tablet by mouth daily.    . nebivolol (BYSTOLIC) 10 MG tablet Take 1 tablet (10 mg total) by mouth 2 (two) times daily. 180 tablet 2  . pantoprazole (PROTONIX) 40 MG tablet Take 1 tablet (40 mg total) by mouth daily. 90 tablet 2  . pentoxifylline (TRENTAL) 400 MG CR tablet Take 400 mg by mouth 2 (two) times daily.     . Probiotic Product (ALIGN) 4 MG CAPS Take 1 capsule by mouth daily.     . sennosides-docusate sodium (SENOKOT-S) 8.6-50 MG tablet Take 1 tablet by mouth daily as needed for constipation.    . traMADol (ULTRAM) 50 MG tablet Take 1-2 tablets by mouth as needed every 8 hours for pain. 30 tablet 0  . triamcinolone (KENALOG) 0.1 % cream Apply 1 application topically. Apply topically to affected area once daily     . dexamethasone (DECADRON) 4 MG tablet Take 5 tabs with food (=20mg ) 12 hrs prior to chemo and 6 hrs with food prior to chemo (Patient not taking: Reported on 02/01/2015) 30 tablet 0  . lidocaine-prilocaine (EMLA) cream Apply 1-2 hrs to [facility] cath site  prior access as directed. (Patient not taking: Reported on 02/01/2015) 30 g 1  . ondansetron (ZOFRAN) 8 MG tablet Take 1 tablet (8 mg total) by mouth every 8 (eight) hours as needed for nausea or vomiting. Will  not make drowsy. (Patient not taking: Reported on 02/01/2015) 30 tablet 01   No current facility-administered medications for this visit.    Functional Status:   In your present state of health, do you have any difficulty performing the following activities: 11/25/2014 10/22/2014  Hearing? N N  Vision? N N  Difficulty concentrating or making decisions? N N  Walking or climbing stairs? N N  Dressing or bathing? N N  Doing errands, shopping? Y N  Preparing Food and eating ? N N  Using the Toilet? N N  In the past  six months, have you accidently leaked urine? N N  Do you have problems with loss of bowel control? N N  Managing your Medications? N N  Managing your Finances? N N  Housekeeping or managing your Housekeeping? Y N    Fall/Depression Screening:    PHQ 2/9 Scores 02/01/2015 12/29/2014 11/25/2014 10/22/2014  PHQ - 2 Score 0 0 0 0    Assessment:   Ongoing follow up with case management related to HTN Ongoing follow up related to bilateral edema Nutritional issues related to healthy eating habits. Safety related pain to right knee.  Plan:  Physical assessment completed with no acute symptoms found today on home visit. Pt able to recite some of the signs and symptoms of HTN and continues adherence with  daily documentation in her calendar tool with daily blood pressures checks. Pt aware of both hypo-hypertension symptoms and what to do if acute. Pt continues to exhibit independence in managing her medical issues and takes all of her prescribed medications with no reported problems. Will continue to encouraged adherence and provide needed resources as needed and requested.  Discussed healthier food items and nutritional recipes also with web-site information on healthy seasonings for her ongoing dietary measures ( pt would like to lose more weight and eat healthier foods). Discussed reducing carbohydrates, starches and fatty food items. Discussed and demonstrated label readings and what to be accountable for with portion sizes ( pt with clear understanding).  Discussed possible intervention for right leg pain and verified pt will be contact her primary care doctor with an office visit next month and inquire on an orthopedic or neurologist consult to address her ongoing with her right leg ( off and on with encounters of pain).  Discussed and review pt's plan of care and discussed and adjusted goals accordingly. Will re-evaluate next month as discharge for the [facility] program has been briefly discussed with pt's  understanding. No other inquires or request at this time.  Raina Mina, RN Care Management Coordinator [facility] Main Office [phone]

## 2015-02-02 ENCOUNTER — Encounter: Payer: Self-pay | Admitting: Medical

## 2015-02-02 ENCOUNTER — Ambulatory Visit (INDEPENDENT_AMBULATORY_CARE_PROVIDER_SITE_OTHER): Payer: Commercial Managed Care - HMO | Admitting: Medical

## 2015-02-02 VITALS — BP 143/76 | HR 76 | Temp 98.8°F | Ht 62.0 in | Wt 239.4 lb

## 2015-02-02 DIAGNOSIS — J45909 Unspecified asthma, uncomplicated: Secondary | ICD-10-CM | POA: Diagnosis not present

## 2015-02-02 DIAGNOSIS — R059 Cough, unspecified: Secondary | ICD-10-CM

## 2015-02-02 DIAGNOSIS — H6501 Acute serous otitis media, right ear: Secondary | ICD-10-CM | POA: Diagnosis not present

## 2015-02-02 DIAGNOSIS — R05 Cough: Secondary | ICD-10-CM

## 2015-02-02 MED ORDER — AZITHROMYCIN 250 MG PO TABS: ORAL_TABLET | ORAL | Status: DC

## 2015-02-02 MED ORDER — HYDROCODONE-HOMATROPINE 5-1.5 MG/5ML PO SYRP: 5.0000 mL | ORAL_SOLUTION | Freq: Three times a day (TID) | ORAL | Status: DC | PRN

## 2015-02-02 NOTE — Progress Notes (Signed)
Pre visit review using our clinic review tool, if applicable. No additional management support is needed unless otherwise documented below in the visit note. 

## 2015-02-02 NOTE — Progress Notes (Signed)
Subjective:    Patient ID: Katelyn Fisher, female    DOB: [birth year], 64 y.o.   MRN: [id number]  HPI  Cough since Saturday. Some productive cough. Non smoker. Pt has asthma but controlled per pt. Last time wheezed one month ago. Did not have to use her inhaler.  Pt just finisihed radiation tx for endometrial cancer Finished chemo in may.  Mild runny nose. Onset mild itchy throat.   Cough keeping her up at night.  Review of Systems  Constitutional: Negative for fever, chills and fatigue.  HENT: Positive for rhinorrhea. Negative for congestion, ear pain, nosebleeds and sore throat.   Respiratory: Positive for cough.   Cardiovascular: Negative for chest pain and palpitations.  Neurological: Negative for dizziness and headaches.  Hematological: Negative for adenopathy. Does not bruise/bleed easily.   Past Medical History  Diagnosis Date  . Asthma   . Hypertension   . Obesity   . Hx of colonic polyps   . GERD (gastroesophageal reflux disease)   . History of colonic diverticulitis   . Arthritis     back  . Hyperlipidemia   . Depression   . Low back pain radiating to right leg 08/18/2012  . Anxiety   . Benign paroxysmal positional vertigo 11/01/2012  . Thyroid disease 06/01/2013  . Cervical cancer screening 10/09/2010    Qualifier: Diagnosis of  By: Wynona Luna   . 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  . Preventative health care 12/09/2013  . Cancer     endometrial  . Family history of breast cancer   . Family history of ovarian cancer   . Family history of prostate cancer   . Family history of colon cancer     History   Social History  . Marital Status: Married    Spouse Name: N/A  . Number of Children: N/A  . Years of Education: N/A   Occupational History  . Not on file.   Social History Main Topics  . Smoking status: Never Smoker   . Smokeless tobacco: Never Used  . Alcohol Use: No  . Drug Use: No  . Sexual Activity: Yes     Comment: lives with husband,  no dietary restrictions   G2 P 2   Other Topics Concern  . Not on file   Social History Narrative   Married- 40 years   Never Smoked   Alcohol use-no   Drug use-no   Daily Caffeine Use   Occupation: housewife   Caffeine use/day:  None   Does Patient Exercise:  yes    Past Surgical History  Procedure Laterality Date  . Cholecystectomy  1991  . Dilation and curettage of uterus    . Tubal ligation    . Appendectomy    . Abdominal hysterectomy  06/23/14    [facility], TRH/BSO    Family History  Problem Relation Age of Onset  . Heart disease Maternal Aunt     x 3 -2 brothers  . Hypertension Maternal Aunt   . Breast cancer Maternal Aunt     maternal half; dx in her 109s  . Ovarian cancer Maternal Aunt     dx in her 46s  . Prostate cancer Father 16  . Diabetes Father   . Cancer Father     lung cancer, asbestos exposure  . Heart disease Brother   . Diabetes Brother   . Other      no FH colon cancer  . Diabetes Mother   . Lupus Mother   .  Heart disease Brother   . Hyperlipidemia Sister   . Heart disease Brother   . Diabetes Brother   . Colon polyps Brother   . Heart disease Brother   . Diabetes Brother   . Colon polyps Brother   . Colon cancer Maternal Grandmother     dx in her 56s  . Leukemia Maternal Uncle 21  . Cancer Paternal Aunt     NOS  . Prostate cancer Paternal Uncle     dx in his 79s  . Breast cancer Other     dx in her 41s    Allergies  Allergen Reactions  . Oxycodone Swelling    Facial swelling and tightness  . Penicillins Swelling    Rash with welps  . Shellfish Allergy Hives, Shortness Of Breath and Swelling  . Iodine     Rash  . Sulfa Antibiotics Rash    Current Outpatient Prescriptions on File Prior to Visit  Medication Sig Dispense Refill  . albuterol (PROAIR HFA) 108 (90 BASE) MCG/ACT inhaler Inhale 2 puffs into the lungs 2 (two) times daily as needed. 3 Inhaler 2  . albuterol (PROVENTIL) (2.5 MG/3ML) 0.083% nebulizer solution Take 3  mLs (2.5 mg total) by nebulization every 6 (six) hours as needed for wheezing or shortness of breath. 150 mL 1  . amLODipine (NORVASC) 10 MG tablet Take 1 tablet (10 mg total) by mouth daily. 90 tablet 2  . aspirin 81 MG tablet Take 81 mg by mouth daily.      Marland Kitchen atorvastatin (LIPITOR) 20 MG tablet Take 10 mg by mouth daily.    . benazepril (LOTENSIN) 40 MG tablet Take 1 tablet (40 mg total) by mouth daily. 90 tablet 2  . budesonide-formoterol (SYMBICORT) 160-4.5 MCG/ACT inhaler Inhale 2 puffs into the lungs 2 (two) times daily. 3 Inhaler 2  . diclofenac sodium (VOLTAREN) 1 % GEL Apply 2 g topically 4 (four) times daily as needed.     . diphenhydramine-acetaminophen (TYLENOL PM) 25-500 MG TABS Take 1 tablet by mouth at bedtime as needed.    . fluticasone (FLONASE) 50 MCG/ACT nasal spray Place 2 sprays into the nose daily. 16 g 0  . furosemide (LASIX) 20 MG tablet Take 1 tablet (20 mg total) by mouth daily. 90 tablet 0  . levothyroxine (SYNTHROID, LEVOTHROID) 25 MCG tablet Take 1 tablet (25 mcg total) by mouth as directed. 1 tab daily except on Saturdays take 2 tabs 102 tablet 2  . meloxicam (MOBIC) 15 MG tablet Take 15 mg by mouth daily as needed.     . Multiple Vitamin (MULTIVITAMIN) tablet Take 1 tablet by mouth daily.    . nebivolol (BYSTOLIC) 10 MG tablet Take 1 tablet (10 mg total) by mouth 2 (two) times daily. 180 tablet 2  . pantoprazole (PROTONIX) 40 MG tablet Take 1 tablet (40 mg total) by mouth daily. 90 tablet 2  . pentoxifylline (TRENTAL) 400 MG CR tablet Take 400 mg by mouth 2 (two) times daily.     . Probiotic Product (ALIGN) 4 MG CAPS Take 1 capsule by mouth daily.     . sennosides-docusate sodium (SENOKOT-S) 8.6-50 MG tablet Take 1 tablet by mouth daily as needed for constipation.    . traMADol (ULTRAM) 50 MG tablet Take 1-2 tablets by mouth as needed every 8 hours for pain. 30 tablet 0  . triamcinolone (KENALOG) 0.1 % cream Apply 1 application topically. Apply topically to affected  area once daily     . ferrous fumarate (HEMOCYTE)  325 (106 FE) MG TABS tablet Take 1 tablet daily on an empty stomach with OJ. 30 each 4   No current facility-administered medications on file prior to visit.    BP 143/76 mmHg  Pulse 76  Temp(Src) 98.8 F (37.1 C) (Oral)  Ht 5\' 2"  (1.575 m)  Wt 239 lb 6.4 oz (108.591 kg)  BMI 43.78 kg/m2  SpO2 99%      Objective:   Physical Exam  General  Mental Status - Alert. General Appearance - Well groomed. Not in acute distress.  Skin Rashes- No Rashes.  HEENT Head- Normal. Ear Auditory Canal - Left- Normal. Right - Normal.Tympanic Membrane- Left- Normal. Right- mild red tm Eye Sclera/Conjunctiva- Left- Normal. Right- Normal. Nose & Sinuses Nasal Mucosa- Left-  Not oggy or Congested. Right-  Not  boggy or Congested. Mouth & Throat Lips: Upper Lip- Normal: no dryness, cracking, pallor, cyanosis, or vesicular eruption. Lower Lip-Normal: no dryness, cracking, pallor, cyanosis or vesicular eruption. Buccal Mucosa- Bilateral- No Aphthous ulcers. Oropharynx- No Discharge or Erythema. Tonsils: Characteristics- Bilateral- No Erythema or Congestion. Size/Enlargement- Bilateral- No enlargement. Discharge- bilateral-None.  Neck Neck- Supple. No Masses.   Chest and Lung Exam Auscultation: Breath Sounds:- even and unlabored, but bilateral upper lobe rhonchi.  Cardiovascular Auscultation:Rythm- Regular, rate and rhythm. Murmurs & Other Heart Sounds:Ausculatation of the heart reveal- No Murmurs.  Lymphatic Head & Neck General Head & Neck Lymphatics: Bilateral: Description- No Localized lymphadenopathy.       Assessment & Plan:  Rt OM early- rx azithromycin'  Cough- rx hycodan. Not this antibiotic should help in event early bronchitis.(prior  Use hycodan and no reaction)  Asthma- stable and lung clear. Use albuterol if needed. Continue other inhalers.  Follow up 7 days or as needed

## 2015-02-02 NOTE — Patient Instructions (Addendum)
Rt OM early- rx azithromycin'  Cough- rx hycodan. Not this antibiotic should help in event early bronchitis.(prior  Use hycodan and no reaction)  Asthma- stable and lung clear. Use albuterol if needed. Continue other inhalers.  Follow up 7 days or as needed

## 2015-02-08 ENCOUNTER — Encounter: Payer: Self-pay | Admitting: Gastroenterology

## 2015-02-11 ENCOUNTER — Encounter: Payer: Self-pay | Admitting: Oncology

## 2015-02-11 ENCOUNTER — Ambulatory Visit
Admission: RE | Admit: 2015-02-11 | Discharge: 2015-02-11 | Disposition: A | Discharge status: Home / Self Care | Payer: Commercial Managed Care - HMO | Source: Ambulatory Visit | Attending: Radiation Oncology | Admitting: Radiation Oncology

## 2015-02-11 VITALS — BP 146/89 | HR 85 | Temp 97.8°F | Resp 16 | Ht 62.0 in | Wt 239.9 lb

## 2015-02-11 DIAGNOSIS — C541 Malignant neoplasm of endometrium: Secondary | ICD-10-CM

## 2015-02-11 HISTORY — DX: Reserved for inherently not codable concepts without codable children: IMO0001

## 2015-02-11 HISTORY — DX: Reserved for concepts with insufficient information to code with codable children: IMO0002

## 2015-02-11 NOTE — Progress Notes (Signed)
Katelyn Fisher here for follow up.  She continues to have pain in her legs with the right worse than her left.  She said this started before radiation and she will be going to Dr. Pollyann Samples 03/08/15 for a "shot."  She denies issues with her bladder and bowels and is not having any nausea.  She reports having occasional clear vaginal discharge.  She is using her vaginal dilator 3 times a week.  She reports her energy level is improving.  She is walking a mile a day.    BP 146/89 mmHg  Pulse 85  Temp(Src) 97.8 F (36.6 C) (Oral)  Resp 16  Ht 5\' 2"  (1.575 m)  Wt 239 lb 14.4 oz (108.818 kg)  BMI 43.87 kg/m2

## 2015-02-11 NOTE — Progress Notes (Signed)
Radiation Oncology         [phone]) [phone] ________________________________  Name: Katelyn Fisher MRN: [id number]  Date: 02/11/2015  DOB: 11-01-1950  Follow-Up Visit Note  Outpatient  CC: Penni Homans, MD  Janie Morning, MD  Diagnosis and Prior Radiotherapy:    ICD-9-CM ICD-10-CM   1. 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 She will see Dr. Skeet Latch on August 11 and Dr. Sheralyn Boatman on September 8. She continues to have pain in her legs with the right is worse than the left. She said this started before radiation and she will be going to Dr. Pollyann Samples 03/08/15 for a "shot." She denies issues with her bladder and bowels and is not having any nausea. Denies blood in her urine or stool. She reports having occasional clear vaginal discharge. She is using her vaginal dilator 3 times a week. She reports her energy level is improving. She is walking a mile a day.   ALLERGIES:  is allergic to oxycodone; penicillins; shellfish allergy; iodine; and sulfa antibiotics.  Meds: Current Outpatient Prescriptions  Medication Sig Dispense Refill  . albuterol (PROAIR HFA) 108 (90 BASE) MCG/ACT inhaler Inhale 2 puffs into the lungs 2 (two) times daily as needed. 3 Inhaler 2  . albuterol (PROVENTIL) (2.5 MG/3ML) 0.083% nebulizer solution Take 3 mLs (2.5 mg total) by nebulization every 6 (six) hours as needed for wheezing or shortness of breath. 150 mL 1  . amLODipine (NORVASC) 10 MG tablet Take 1 tablet (10 mg total) by mouth daily. 90 tablet 2  . aspirin 81 MG tablet Take 81 mg by mouth daily.      Marland Kitchen atorvastatin (LIPITOR) 20 MG tablet Take 10 mg by mouth daily.    . benazepril (LOTENSIN) 40 MG tablet Take 1 tablet (40 mg total) by mouth daily. 90 tablet 2  .  budesonide-formoterol (SYMBICORT) 160-4.5 MCG/ACT inhaler Inhale 2 puffs into the lungs 2 (two) times daily. 3 Inhaler 2  . diclofenac sodium (VOLTAREN) 1 % GEL Apply 2 g topically 4 (four) times daily as needed.     . diphenhydramine-acetaminophen (TYLENOL PM) 25-500 MG TABS Take 1 tablet by mouth at bedtime as needed.    . ferrous fumarate (HEMOCYTE) 325 (106 FE) MG TABS tablet Take 1 tablet daily on an empty stomach with OJ. 30 each 4  . fluticasone (FLONASE) 50 MCG/ACT nasal spray Place 2 sprays into the nose daily. 16 g 0  . furosemide (LASIX) 20 MG tablet Take 1 tablet (20 mg total) by mouth daily. 90 tablet 0  . HYDROcodone-homatropine (HYCODAN) 5-1.5 MG/5ML syrup Take 5 mLs by mouth every 8 (eight) hours as needed for cough. 120 mL 0  . levothyroxine (SYNTHROID, LEVOTHROID) 25 MCG tablet Take 1 tablet (25 mcg total) by mouth as directed. 1 tab daily except on Saturdays take 2 tabs 102 tablet 2  . meloxicam (MOBIC) 15 MG tablet Take 15 mg by mouth daily as needed.     . Multiple Vitamin (MULTIVITAMIN) tablet Take 1 tablet by mouth daily.    . nebivolol (BYSTOLIC) 10 MG tablet Take 1 tablet (10 mg total) by mouth 2 (two) times daily. 180 tablet 2  . pantoprazole (PROTONIX) 40 MG tablet Take 1 tablet (40 mg total) by mouth daily. 90 tablet 2  . pentoxifylline (  TRENTAL) 400 MG CR tablet Take 400 mg by mouth 2 (two) times daily.     . Probiotic Product (ALIGN) 4 MG CAPS Take 1 capsule by mouth daily.     . sennosides-docusate sodium (SENOKOT-S) 8.6-50 MG tablet Take 1 tablet by mouth daily as needed for constipation.    . triamcinolone (KENALOG) 0.1 % cream Apply 1 application topically. Apply topically to affected area once daily     . traMADol (ULTRAM) 50 MG tablet Take 1-2 tablets by mouth as needed every 8 hours for pain. (Patient not taking: Reported on 02/11/2015) 30 tablet 0   No current facility-administered medications for this encounter.    Physical Findings: The patient is in no  acute distress. Patient is alert and oriented.  height is 5\' 2"  (1.575 m) and weight is 239 lb 14.4 oz (108.818 kg). Her oral temperature is 97.8 F (36.6 C). Her blood pressure is 146/89 and her pulse is 85. Her respiration is 16. .    Abdomen and pelvis is non-tender to palpation. Edema is noted in her ankles bilaterally and is wearing compression stockings.  Lab Findings: Lab Results  Component Value Date   WBC 4.0 12/23/2014   HGB 9.6* 12/23/2014   HCT 31.1* 12/23/2014   MCV 71.5* 12/23/2014   PLT 155 Large platelets present 12/23/2014    Radiographic Findings: No results found.  Impression/Plan: recovering well from HDR brachytherapy. I gave the pt a card and she will follow up with me in 6 months; anticipate GYN exam at that time. The pt is scheduled to see Dr. Skeet Latch on August 11 and Dr. Marko Plume on September 8.   This document serves as a record of services personally performed by Eppie Gibson, MD. It was created on her behalf by Darcus Austin, a trained medical scribe. The creation of this record is based on the scribe's personal observations and the provider's statements to them. This document has been checked and approved by the attending provider.     _____________________________________   Eppie Gibson, MD

## 2015-02-14 ENCOUNTER — Encounter: Payer: Self-pay | Admitting: Family Medicine

## 2015-02-15 ENCOUNTER — Encounter (HOSPITAL_BASED_OUTPATIENT_CLINIC_OR_DEPARTMENT_OTHER): Payer: Self-pay

## 2015-02-15 ENCOUNTER — Other Ambulatory Visit: Payer: Self-pay | Admitting: Family Medicine

## 2015-02-15 ENCOUNTER — Emergency Department (HOSPITAL_BASED_OUTPATIENT_CLINIC_OR_DEPARTMENT_OTHER)
Admission: EM | Admit: 2015-02-15 | Discharge: 2015-02-15 | Disposition: A | Discharge status: Home / Self Care | Payer: Commercial Managed Care - HMO | Attending: Emergency Medicine | Admitting: Emergency Medicine

## 2015-02-15 DIAGNOSIS — I10 Essential (primary) hypertension: Secondary | ICD-10-CM | POA: Insufficient documentation

## 2015-02-15 DIAGNOSIS — E785 Hyperlipidemia, unspecified: Secondary | ICD-10-CM | POA: Diagnosis not present

## 2015-02-15 DIAGNOSIS — F329 Major depressive disorder, single episode, unspecified: Secondary | ICD-10-CM | POA: Diagnosis not present

## 2015-02-15 DIAGNOSIS — M545 Low back pain: Secondary | ICD-10-CM | POA: Insufficient documentation

## 2015-02-15 DIAGNOSIS — F419 Anxiety disorder, unspecified: Secondary | ICD-10-CM | POA: Insufficient documentation

## 2015-02-15 DIAGNOSIS — M5431 Sciatica, right side: Secondary | ICD-10-CM | POA: Diagnosis not present

## 2015-02-15 DIAGNOSIS — G629 Polyneuropathy, unspecified: Secondary | ICD-10-CM | POA: Diagnosis not present

## 2015-02-15 DIAGNOSIS — Z7982 Long term (current) use of aspirin: Secondary | ICD-10-CM | POA: Diagnosis not present

## 2015-02-15 DIAGNOSIS — Z79899 Other long term (current) drug therapy: Secondary | ICD-10-CM | POA: Diagnosis not present

## 2015-02-15 DIAGNOSIS — J45909 Unspecified asthma, uncomplicated: Secondary | ICD-10-CM | POA: Diagnosis not present

## 2015-02-15 DIAGNOSIS — Z923 Personal history of irradiation: Secondary | ICD-10-CM | POA: Diagnosis not present

## 2015-02-15 DIAGNOSIS — Z880 Allergy status to penicillin: Secondary | ICD-10-CM | POA: Insufficient documentation

## 2015-02-15 DIAGNOSIS — M1990 Unspecified osteoarthritis, unspecified site: Secondary | ICD-10-CM | POA: Diagnosis not present

## 2015-02-15 DIAGNOSIS — Z8541 Personal history of malignant neoplasm of cervix uteri: Secondary | ICD-10-CM | POA: Insufficient documentation

## 2015-02-15 DIAGNOSIS — G620 Drug-induced polyneuropathy: Secondary | ICD-10-CM

## 2015-02-15 DIAGNOSIS — K219 Gastro-esophageal reflux disease without esophagitis: Secondary | ICD-10-CM | POA: Insufficient documentation

## 2015-02-15 DIAGNOSIS — E669 Obesity, unspecified: Secondary | ICD-10-CM | POA: Insufficient documentation

## 2015-02-15 DIAGNOSIS — M79604 Pain in right leg: Secondary | ICD-10-CM | POA: Diagnosis present

## 2015-02-15 DIAGNOSIS — T451X5A Adverse effect of antineoplastic and immunosuppressive drugs, initial encounter: Secondary | ICD-10-CM | POA: Insufficient documentation

## 2015-02-15 DIAGNOSIS — Z86010 Personal history of colonic polyps: Secondary | ICD-10-CM | POA: Insufficient documentation

## 2015-02-15 DIAGNOSIS — Z7951 Long term (current) use of inhaled steroids: Secondary | ICD-10-CM | POA: Insufficient documentation

## 2015-02-15 DIAGNOSIS — E079 Disorder of thyroid, unspecified: Secondary | ICD-10-CM | POA: Diagnosis not present

## 2015-02-15 DIAGNOSIS — E876 Hypokalemia: Secondary | ICD-10-CM

## 2015-02-15 DIAGNOSIS — Z8542 Personal history of malignant neoplasm of other parts of uterus: Secondary | ICD-10-CM | POA: Insufficient documentation

## 2015-02-15 DIAGNOSIS — R739 Hyperglycemia, unspecified: Secondary | ICD-10-CM

## 2015-02-15 MED ORDER — CYCLOBENZAPRINE HCL 10 MG PO TABS
10.0000 mg | ORAL_TABLET | Freq: Once | ORAL | Status: AC
  Administered 2015-02-15: 10 mg via ORAL
  Filled 2015-02-15: qty 1

## 2015-02-15 MED ORDER — HYDROCODONE-ACETAMINOPHEN 5-325 MG PO TABS
1.0000 | ORAL_TABLET | Freq: Once | ORAL | Status: AC
  Administered 2015-02-15: 1 via ORAL
  Filled 2015-02-15: qty 1

## 2015-02-15 MED ORDER — GABAPENTIN 300 MG PO CAPS: 300.0000 mg | ORAL_CAPSULE | Freq: Three times a day (TID) | ORAL | Status: DC

## 2015-02-15 MED ORDER — HYDROCODONE-ACETAMINOPHEN 5-325 MG PO TABS: 1.0000 | ORAL_TABLET | Freq: Four times a day (QID) | ORAL | Status: DC | PRN

## 2015-02-15 MED ORDER — CYCLOBENZAPRINE HCL 10 MG PO TABS: 10.0000 mg | ORAL_TABLET | Freq: Two times a day (BID) | ORAL | Status: DC | PRN

## 2015-02-15 NOTE — ED Provider Notes (Signed)
CSN: [id number]     Arrival date & time 02/15/15  1114 History   First MD Initiated Contact with Patient 02/15/15 1125     Chief Complaint  Patient presents with  . Leg Pain     (Consider location/radiation/quality/duration/timing/severity/associated sxs/prior Treatment) Patient is a 64 y.o. female presenting with leg pain. The history is provided by the patient.  Leg Pain Location:  Leg Time since incident: This pain has been going on for months but worse this morning. Injury: no   Leg location:  R leg Pain details:    Quality:  Aching and burning   Radiates to:  R leg   Severity:  Severe   Onset quality:  Gradual   Progression:  Waxing and waning Chronicity:  Chronic Relieved by:  Rest Worsened by:  Bearing weight Ineffective treatments: Tramadol. Associated symptoms: back pain and numbness   Associated symptoms: no decreased ROM, no fatigue, no fever, no muscle weakness and no swelling   Risk factors comment:  Has been treated for sciatica seeing orthopedics and has an appointment first urine injection on August 16   Past Medical History  Diagnosis Date  . Asthma   . Hypertension   . Obesity   . Hx of colonic polyps   . GERD (gastroesophageal reflux disease)   . History of colonic diverticulitis   . Arthritis     back  . Hyperlipidemia   . Depression   . Low back pain radiating to right leg 08/18/2012  . Anxiety   . Benign paroxysmal positional vertigo 11/01/2012  . Thyroid disease 06/01/2013  . Cervical cancer screening 10/09/2010    Qualifier: Diagnosis of  By: Wynona Luna   . 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  . Preventative health care 12/09/2013  . Cancer     endometrial  . Family history of breast cancer   . Family history of ovarian cancer   . Family history of prostate cancer   . Family history of colon cancer   . Radiation 12/17/14-01/03/15    vaginal brachytherapy   Past Surgical History  Procedure Laterality Date  . Cholecystectomy  1991  .  Dilation and curettage of uterus    . Tubal ligation    . Appendectomy    . Abdominal hysterectomy  06/23/14    [facility], TRH/BSO   Family History  Problem Relation Age of Onset  . Heart disease Maternal Aunt     x 3 -2 brothers  . Hypertension Maternal Aunt   . Breast cancer Maternal Aunt     maternal half; dx in her 106s  . Ovarian cancer Maternal Aunt     dx in her 73s  . Prostate cancer Father 5  . Diabetes Father   . Cancer Father     lung cancer, asbestos exposure  . Heart disease Brother   . Diabetes Brother   . Other      no FH colon cancer  . Diabetes Mother   . Lupus Mother   . Heart disease Brother   . Hyperlipidemia Sister   . Heart disease Brother   . Diabetes Brother   . Colon polyps Brother   . Heart disease Brother   . Diabetes Brother   . Colon polyps Brother   . Colon cancer Maternal Grandmother     dx in her 94s  . Leukemia Maternal Uncle 21  . Cancer Paternal Aunt     NOS  . Prostate cancer Paternal Uncle     dx in  his 39s  . Breast cancer Other     dx in her 25s   History  Substance Use Topics  . Smoking status: Never Smoker   . Smokeless tobacco: Never Used  . Alcohol Use: No   OB History    No data available     Review of Systems  Constitutional: Negative for fever and fatigue.  Musculoskeletal: Positive for back pain.  All other systems reviewed and are negative.     Allergies  Oxycodone; Penicillins; Shellfish allergy; Iodine; and Sulfa antibiotics  Home Medications   Prior to Admission medications   Medication Sig Start Date End Date Taking? Authorizing Provider  albuterol (PROAIR HFA) 108 (90 BASE) MCG/ACT inhaler Inhale 2 puffs into the lungs 2 (two) times daily as needed. 05/28/12   Burnice Logan, MD  albuterol (PROVENTIL) (2.5 MG/3ML) 0.083% nebulizer solution Take 3 mLs (2.5 mg total) by nebulization every 6 (six) hours as needed for wheezing or shortness of breath. 07/06/13   Brunetta Jeans, PA-C  amLODipine  (NORVASC) 10 MG tablet Take 1 tablet (10 mg total) by mouth daily. 11/19/14   Mosie Lukes, MD  aspirin 81 MG tablet Take 81 mg by mouth daily.      Historical Provider, MD  atorvastatin (LIPITOR) 20 MG tablet Take 10 mg by mouth daily.    Historical Provider, MD  benazepril (LOTENSIN) 40 MG tablet Take 1 tablet (40 mg total) by mouth daily. 11/19/14   Mosie Lukes, MD  budesonide-formoterol [facility]) 160-4.5 MCG/ACT inhaler Inhale 2 puffs into the lungs 2 (two) times daily. 05/21/12   Burnice Logan, MD  cyclobenzaprine (FLEXERIL) 10 MG tablet Take 1 tablet (10 mg total) by mouth 2 (two) times daily as needed for muscle spasms. 02/15/15   Blanchie Dessert, MD  diclofenac sodium (VOLTAREN) 1 % GEL Apply 2 g topically 4 (four) times daily as needed.     Historical Provider, MD  diphenhydramine-acetaminophen (TYLENOL PM) 25-500 MG TABS Take 1 tablet by mouth at bedtime as needed.    Historical Provider, MD  ferrous fumarate (HEMOCYTE) 325 (106 FE) MG TABS tablet Take 1 tablet daily on an empty stomach with OJ. 10/18/14   Lennis P Livesay, MD  fluticasone (FLONASE) 50 MCG/ACT nasal spray Place 2 sprays into the nose daily. 02/06/13   Brunetta Jeans, PA-C  furosemide (LASIX) 20 MG tablet Take 1 tablet (20 mg total) by mouth daily. 10/18/14   Mosie Lukes, MD  gabapentin (NEURONTIN) 300 MG capsule Take 1 capsule (300 mg total) by mouth 3 (three) times daily. 02/15/15   Blanchie Dessert, MD  HYDROcodone-acetaminophen (NORCO/VICODIN) 5-325 MG per tablet Take 1-2 tablets by mouth every 6 (six) hours as needed. 02/15/15   Blanchie Dessert, MD  HYDROcodone-homatropine (HYCODAN) 5-1.5 MG/5ML syrup Take 5 mLs by mouth every 8 (eight) hours as needed for cough. 02/02/15   Percell Miller Saguier, PA-C  levothyroxine (SYNTHROID, LEVOTHROID) 25 MCG tablet Take 1 tablet (25 mcg total) by mouth as directed. 1 tab daily except on Saturdays take 2 tabs 11/19/14   Mosie Lukes, MD  meloxicam (MOBIC) 15 MG tablet Take 15 mg by  mouth daily as needed.     Historical Provider, MD  Multiple Vitamin (MULTIVITAMIN) tablet Take 1 tablet by mouth daily.    Historical Provider, MD  nebivolol (BYSTOLIC) 10 MG tablet Take 1 tablet (10 mg total) by mouth 2 (two) times daily. 11/19/14   Mosie Lukes, MD  pantoprazole (PROTONIX) 40 MG  tablet Take 1 tablet (40 mg total) by mouth daily. 07/30/13   Mosie Lukes, MD  pentoxifylline (TRENTAL) 400 MG CR tablet Take 400 mg by mouth 2 (two) times daily.     Historical Provider, MD  Probiotic Product (ALIGN) 4 MG CAPS Take 1 capsule by mouth daily.     Historical Provider, MD  sennosides-docusate sodium (SENOKOT-S) 8.6-50 MG tablet Take 1 tablet by mouth daily as needed for constipation.    Historical Provider, MD  traMADol (ULTRAM) 50 MG tablet Take 1-2 tablets by mouth as needed every 8 hours for pain. Patient not taking: Reported on 02/11/2015 10/18/14   Gordy Levan, MD  triamcinolone (KENALOG) 0.1 % cream Apply 1 application topically. Apply topically to affected area once daily     Historical Provider, MD   BP 158/92 mmHg  Pulse 66  Temp(Src) 97.9 F (36.6 C) (Oral)  Resp 20  Ht 5\' 2"  (1.575 m)  Wt 239 lb (108.41 kg)  BMI 43.70 kg/m2  SpO2 100% Physical Exam  Constitutional: She is oriented to person, place, and time. She appears well-developed and well-nourished. No distress.  HENT:  Head: Normocephalic and atraumatic.  Mouth/Throat: Oropharynx is clear and moist.  Eyes: EOM are normal. Pupils are equal, round, and reactive to light.  Neck: No spinous process tenderness and no muscular tenderness present. No rigidity. Normal range of motion present.  Cardiovascular: Normal rate and intact distal pulses.   Pulmonary/Chest: Effort normal.  Abdominal: There is no CVA tenderness.  Musculoskeletal: She exhibits tenderness.       Lumbar back: She exhibits pain. She exhibits normal range of motion, no swelling, no deformity and normal pulse.  Tenderness along the lateral right  leg in the L5 distribution  Neurological: She is alert and oriented to person, place, and time. She has normal strength. Coordination normal.  Decreased sensation to the bottom of feet and the hands  Skin: Skin is warm and dry. No rash noted.  Psychiatric: She has a normal mood and affect.  Nursing note and vitals reviewed.   ED Course  Procedures (including critical care time) Labs Review Labs Reviewed - No data to display  Imaging Review No results found.   EKG Interpretation None      MDM   Final diagnoses:  Sciatica, right  Peripheral neuropathy due to chemotherapy    Pt with gradual onset of back pain suggestive of radiculopathy for months but worse today despite taking tramadol.  No neurovascular compromise and no incontinence.  Pt has no infectious sx or other red flags concerning for pathologic back pain.  Patient recently finished chemotherapy and radiation treatment for endometrial cancer. She's been seeing an orthopedist about this pain in there is no evidence that this is metastases or complications from cancer. Pt is able to ambulate but is painful.  Normal strength and reflexes on exam.  Denies trauma.  Also since getting chemotherapy patient has had neuropathy of her hands and feet is currently untreated. Will give pt pain control and to return for developement of above sx. Will give patient hydrocodone as she is taking this before and had no reaction, Flexeril and gabapentin. She has an appointment with her regular physician on Friday and they can decide if the gabapentin is beneficial and want to continue    Blanchie Dessert, MD 02/15/15 1149

## 2015-02-15 NOTE — ED Notes (Signed)
Directed to pharmacy to pick up medications 

## 2015-02-15 NOTE — ED Notes (Signed)
C/o pain to entire right leg x "long while"-no injury-steady gait-states she is being seen by ortho for same and has appt 8/16 for an "injection"

## 2015-02-15 NOTE — Telephone Encounter (Signed)
Labs entered and lab appt. made

## 2015-02-17 ENCOUNTER — Other Ambulatory Visit (INDEPENDENT_AMBULATORY_CARE_PROVIDER_SITE_OTHER): Payer: Commercial Managed Care - HMO

## 2015-02-17 ENCOUNTER — Other Ambulatory Visit: Payer: Self-pay | Admitting: Family Medicine

## 2015-02-17 DIAGNOSIS — E876 Hypokalemia: Secondary | ICD-10-CM

## 2015-02-17 DIAGNOSIS — E785 Hyperlipidemia, unspecified: Secondary | ICD-10-CM | POA: Diagnosis not present

## 2015-02-17 DIAGNOSIS — R739 Hyperglycemia, unspecified: Secondary | ICD-10-CM | POA: Diagnosis not present

## 2015-02-17 LAB — COMPREHENSIVE METABOLIC PANEL
ALK PHOS: 67 U/L (ref 39–117)
ALT: 20 U/L (ref 0–35)
AST: 20 U/L (ref 0–37)
Albumin: 3.7 g/dL (ref 3.5–5.2)
BILIRUBIN TOTAL: 0.3 mg/dL (ref 0.2–1.2)
BUN: 12 mg/dL (ref 6–23)
CO2: 26 mEq/L (ref 19–32)
Calcium: 9.2 mg/dL (ref 8.4–10.5)
Chloride: 107 mEq/L (ref 96–112)
Creatinine, Ser: 0.77 mg/dL (ref 0.40–1.20)
GFR: 97.32 mL/min (ref >60.00)
GLUCOSE: 101 mg/dL — AB (ref 70–99)
Potassium: 3.9 mEq/L (ref 3.5–5.1)
SODIUM: 142 meq/L (ref 135–145)
Total Protein: 6.8 g/dL (ref 6.0–8.3)

## 2015-02-17 LAB — TSH: TSH: 5.22 u[IU]/mL — ABNORMAL HIGH (ref 0.35–4.50)

## 2015-02-17 LAB — LIPID PANEL
CHOLESTEROL: 167 mg/dL (ref 0–200)
HDL: 44.5 mg/dL (ref >39.00)
LDL Cholesterol: 99 mg/dL (ref 0–99)
NonHDL: 122.66
Total CHOL/HDL Ratio: 4
Triglycerides: 119 mg/dL (ref 0.0–149.0)
VLDL: 23.8 mg/dL (ref 0.0–40.0)

## 2015-02-17 MED ORDER — LEVOTHYROXINE SODIUM 50 MCG PO TABS: 50.0000 ug | ORAL_TABLET | Freq: Every day | ORAL | Status: DC

## 2015-02-17 NOTE — Addendum Note (Signed)
Addended by: Peggyann Shoals on: 02/17/2015 09:30 AM   Modules accepted: Orders

## 2015-02-18 ENCOUNTER — Ambulatory Visit (HOSPITAL_BASED_OUTPATIENT_CLINIC_OR_DEPARTMENT_OTHER): Payer: Commercial Managed Care - HMO

## 2015-02-18 ENCOUNTER — Ambulatory Visit (HOSPITAL_BASED_OUTPATIENT_CLINIC_OR_DEPARTMENT_OTHER)
Admission: RE | Admit: 2015-02-18 | Discharge: 2015-02-18 | Disposition: A | Discharge status: Home / Self Care | Payer: Commercial Managed Care - HMO | Source: Ambulatory Visit | Attending: Family Medicine | Admitting: Family Medicine

## 2015-02-18 ENCOUNTER — Ambulatory Visit (INDEPENDENT_AMBULATORY_CARE_PROVIDER_SITE_OTHER): Payer: Commercial Managed Care - HMO | Admitting: Family Medicine

## 2015-02-18 ENCOUNTER — Other Ambulatory Visit (HOSPITAL_BASED_OUTPATIENT_CLINIC_OR_DEPARTMENT_OTHER): Payer: Commercial Managed Care - HMO

## 2015-02-18 ENCOUNTER — Encounter: Payer: Self-pay | Admitting: Family Medicine

## 2015-02-18 VITALS — BP 140/89 | HR 85 | Temp 98.7°F | Resp 18

## 2015-02-18 VITALS — BP 132/88 | HR 86 | Temp 98.2°F | Resp 18 | Wt 240.8 lb

## 2015-02-18 DIAGNOSIS — E669 Obesity, unspecified: Secondary | ICD-10-CM

## 2015-02-18 DIAGNOSIS — Z452 Encounter for adjustment and management of vascular access device: Secondary | ICD-10-CM | POA: Diagnosis not present

## 2015-02-18 DIAGNOSIS — C541 Malignant neoplasm of endometrium: Secondary | ICD-10-CM

## 2015-02-18 DIAGNOSIS — Z95828 Presence of other vascular implants and grafts: Secondary | ICD-10-CM

## 2015-02-18 DIAGNOSIS — I10 Essential (primary) hypertension: Secondary | ICD-10-CM | POA: Diagnosis not present

## 2015-02-18 DIAGNOSIS — M5431 Sciatica, right side: Secondary | ICD-10-CM

## 2015-02-18 DIAGNOSIS — K219 Gastro-esophageal reflux disease without esophagitis: Secondary | ICD-10-CM

## 2015-02-18 DIAGNOSIS — E119 Type 2 diabetes mellitus without complications: Secondary | ICD-10-CM | POA: Diagnosis not present

## 2015-02-18 DIAGNOSIS — E079 Disorder of thyroid, unspecified: Secondary | ICD-10-CM

## 2015-02-18 DIAGNOSIS — M25551 Pain in right hip: Secondary | ICD-10-CM | POA: Diagnosis present

## 2015-02-18 DIAGNOSIS — E785 Hyperlipidemia, unspecified: Secondary | ICD-10-CM

## 2015-02-18 LAB — CBC WITH DIFFERENTIAL/PLATELET
BASO%: 0.6 % (ref 0.0–2.0)
BASOS ABS: 0.1 10*3/uL (ref 0.0–0.1)
Basophils Absolute: 0 10*3/uL (ref 0.0–0.1)
Basophils Relative: 1.3 % (ref 0.0–3.0)
EOS%: 2 % (ref 0.0–7.0)
Eosinophils Absolute: 0.1 10*3/uL (ref 0.0–0.7)
Eosinophils Absolute: 0.1 10*3/uL (ref 0.0–0.5)
Eosinophils Relative: 1.5 % (ref 0.0–5.0)
HCT: 36.7 % (ref 34.8–46.6)
HEMATOCRIT: 37.3 % (ref 36.0–46.0)
HGB: 11.3 g/dL — ABNORMAL LOW (ref 11.6–15.9)
Hemoglobin: 11.6 g/dL — ABNORMAL LOW (ref 12.0–15.0)
LYMPH%: 38.7 % (ref 14.0–49.7)
LYMPHS PCT: 43.9 % (ref 12.0–46.0)
Lymphs Abs: 1.7 10*3/uL (ref 0.7–4.0)
MCH: 21.8 pg — ABNORMAL LOW (ref 25.1–34.0)
MCHC: 31.2 g/dL (ref 30.0–36.0)
MCHC: 30.8 g/dL — AB (ref 31.5–36.0)
MCV: 71.2 fl — AB (ref 78.0–100.0)
MCV: 70.8 fL — ABNORMAL LOW (ref 79.5–101.0)
MONO#: 0.4 10*3/uL (ref 0.1–0.9)
MONO%: 10.2 % (ref 0.0–14.0)
Monocytes Absolute: 0.4 10*3/uL (ref 0.1–1.0)
Monocytes Relative: 9.2 % (ref 3.0–12.0)
NEUT%: 48.5 % (ref 38.4–76.8)
NEUTROS ABS: 1.7 10*3/uL (ref 1.4–7.7)
NEUTROS ABS: 1.7 10*3/uL (ref 1.5–6.5)
Neutrophils Relative %: 44.1 % (ref 43.0–77.0)
Platelets: 176 10*3/uL (ref 150.0–400.0)
Platelets: 211 10*3/uL (ref 145–400)
RBC: 5.23 Mil/uL — ABNORMAL HIGH (ref 3.87–5.11)
RBC: 5.18 10*6/uL (ref 3.70–5.45)
RDW: 17 % — AB (ref 11.5–15.5)
RDW: 17.2 % — ABNORMAL HIGH (ref 11.2–14.5)
WBC: 3.9 10*3/uL — ABNORMAL LOW (ref 4.0–10.5)
WBC: 3.5 10*3/uL — ABNORMAL LOW (ref 3.9–10.3)
lymph#: 1.3 10*3/uL (ref 0.9–3.3)

## 2015-02-18 LAB — COMPREHENSIVE METABOLIC PANEL (CC13)
ALBUMIN: 3.5 g/dL (ref 3.5–5.0)
ALT: 21 U/L (ref 0–55)
ANION GAP: 7 meq/L (ref 3–11)
AST: 22 U/L (ref 5–34)
Alkaline Phosphatase: 70 U/L (ref 40–150)
BUN: 9.7 mg/dL (ref 7.0–26.0)
CHLORIDE: 109 meq/L (ref 98–109)
CO2: 26 mEq/L (ref 22–29)
CREATININE: 0.7 mg/dL (ref 0.6–1.1)
Calcium: 9 mg/dL (ref 8.4–10.4)
Glucose: 108 mg/dl (ref 70–140)
POTASSIUM: 3.3 meq/L — AB (ref 3.5–5.1)
Sodium: 142 mEq/L (ref 136–145)
TOTAL PROTEIN: 6.7 g/dL (ref 6.4–8.3)
Total Bilirubin: 0.28 mg/dL (ref 0.20–1.20)

## 2015-02-18 LAB — HEMOGLOBIN A1C: Hgb A1c MFr Bld: 6.4 % (ref 4.6–6.5)

## 2015-02-18 MED ORDER — HEPARIN SOD (PORK) LOCK FLUSH 100 UNIT/ML IV SOLN
500.0000 [IU] | Freq: Once | INTRAVENOUS | Status: AC
  Administered 2015-02-18: 500 [IU] via INTRAVENOUS
  Filled 2015-02-18: qty 5

## 2015-02-18 MED ORDER — LEVOTHYROXINE SODIUM 50 MCG PO TABS: ORAL_TABLET | ORAL | Status: DC

## 2015-02-18 MED ORDER — SODIUM CHLORIDE 0.9 % IJ SOLN
10.0000 mL | INTRAMUSCULAR | Status: DC | PRN
  Administered 2015-02-18: 10 mL via INTRAVENOUS
  Filled 2015-02-18: qty 10

## 2015-02-18 NOTE — Patient Instructions (Addendum)
Salon Pas patches found over the counter at night and during the day.    Hypertension Hypertension, commonly called high blood pressure, is when the force of blood pumping through your arteries is too strong. Your arteries are the blood vessels that carry blood from your heart throughout your body. A blood pressure reading consists of a higher number over a lower number, such as 110/72. The higher number (systolic) is the pressure inside your arteries when your heart pumps. The lower number (diastolic) is the pressure inside your arteries when your heart relaxes. Ideally you want your blood pressure below 120/80. Hypertension forces your heart to work harder to pump blood. Your arteries may become narrow or stiff. Having hypertension puts you at risk for heart disease, stroke, and other problems.  RISK FACTORS Some risk factors for high blood pressure are controllable. Others are not.  Risk factors you cannot control include:   Race. You may be at higher risk if you are African American.  Age. Risk increases with age.  Gender. Men are at higher risk than women before age 4 years. After age 40, women are at higher risk than men. Risk factors you can control include:  Not getting enough exercise or physical activity.  Being overweight.  Getting too much fat, sugar, calories, or salt in your diet.  Drinking too much alcohol. SIGNS AND SYMPTOMS Hypertension does not usually cause signs or symptoms. Extremely high blood pressure (hypertensive crisis) may cause headache, anxiety, shortness of breath, and nosebleed. DIAGNOSIS  To check if you have hypertension, your health care provider will measure your blood pressure while you are seated, with your arm held at the level of your heart. It should be measured at least twice using the same arm. Certain conditions can cause a difference in blood pressure between your right and left arms. A blood pressure reading that is higher than normal on one  occasion does not mean that you need treatment. If one blood pressure reading is high, ask your health care provider about having it checked again. TREATMENT  Treating high blood pressure includes making lifestyle changes and possibly taking medicine. Living a healthy lifestyle can help lower high blood pressure. You may need to change some of your habits. Lifestyle changes may include:  Following the DASH diet. This diet is high in fruits, vegetables, and whole grains. It is low in salt, red meat, and added sugars.  Getting at least 2 hours of brisk physical activity every week.  Losing weight if necessary.  Not smoking.  Limiting alcoholic beverages.  Learning ways to reduce stress. If lifestyle changes are not enough to get your blood pressure under control, your health care provider may prescribe medicine. You may need to take more than one. Work closely with your health care provider to understand the risks and benefits. HOME CARE INSTRUCTIONS  Have your blood pressure rechecked as directed by your health care provider.   Take medicines only as directed by your health care provider. Follow the directions carefully. Blood pressure medicines must be taken as prescribed. The medicine does not work as well when you skip doses. Skipping doses also puts you at risk for problems.   Do not smoke.   Monitor your blood pressure at home as directed by your health care provider. SEEK MEDICAL CARE IF:   You think you are having a reaction to medicines taken.  You have recurrent headaches or feel dizzy.  You have swelling in your ankles.  You have trouble  with your vision. SEEK IMMEDIATE MEDICAL CARE IF:  You develop a severe headache or confusion.  You have unusual weakness, numbness, or feel faint.  You have severe chest or abdominal pain.  You vomit repeatedly.  You have trouble breathing. MAKE SURE YOU:   Understand these instructions.  Will watch your  condition.  Will get help right away if you are not doing well or get worse. Document Released: 07/09/2005 Document Revised: 11/23/2013 Document Reviewed: 05/01/2013 [facility] Patient Information 2015 [place], [place]. This information is not intended to replace advice given to you by your health care provider. Make sure you discuss any questions you have with your health care provider.

## 2015-02-18 NOTE — Progress Notes (Signed)
Pre visit review using our clinic review tool, if applicable. No additional management support is needed unless otherwise documented below in the visit note. 

## 2015-02-18 NOTE — Addendum Note (Signed)
Addended by: Peggyann Shoals on: 02/18/2015 07:53 AM   Modules accepted: Orders

## 2015-02-18 NOTE — Patient Instructions (Signed)

## 2015-02-18 NOTE — Progress Notes (Signed)
Katelyn Fisher  [id number] July 01, 1951 02/18/2015      Progress Note-Follow Up  Subjective  Chief Complaint  Chief Complaint  Patient presents with  . Follow-up    HPI  Patient is a 64 y.o. female in today for routine medical care. Patient is in today for follow-up She is currently being treated  Endometrial cancer with chemothera She has some myalgias, arthralgias and fatigue No acute illness or concerns are noted. Continues to struggle with back pain but is following with Dr Nelva Bush. Denies CP/palp/SOB/HA/congestion/fevers/GI or GU c/o. Taking meds as prescribed  Past Medical History  Diagnosis Date  . Asthma   . Hypertension   . Obesity   . Hx of colonic polyps   . GERD (gastroesophageal reflux disease)   . History of colonic diverticulitis   . Arthritis     back  . Hyperlipidemia   . Depression   . Low back pain radiating to right leg 08/18/2012  . Anxiety   . Benign paroxysmal positional vertigo 11/01/2012  . Thyroid disease 06/01/2013  . Cervical cancer screening 10/09/2010    Qualifier: Diagnosis of  By: Wynona Luna   . 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  . Preventative health care 12/09/2013  . Cancer     endometrial  . Family history of breast cancer   . Family history of ovarian cancer   . Family history of prostate cancer   . Family history of colon cancer   . Radiation 12/17/14-01/03/15    vaginal brachytherapy    Past Surgical History  Procedure Laterality Date  . Cholecystectomy  1991  . Dilation and curettage of uterus    . Tubal ligation    . Appendectomy    . Abdominal hysterectomy  06/23/14    [facility], TRH/BSO    Family History  Problem Relation Age of Onset  . Heart disease Maternal Aunt     x 3 -2 brothers  . Hypertension Maternal Aunt   . Breast cancer Maternal Aunt     maternal half; dx in her 22s  . Ovarian cancer Maternal Aunt     dx in her 37s  . Prostate cancer Father 31  . Diabetes Father   . Cancer Father     lung cancer,  asbestos exposure  . Heart disease Brother   . Diabetes Brother   . Other      no FH colon cancer  . Diabetes Mother   . Lupus Mother   . Heart disease Brother   . Hyperlipidemia Sister   . Heart disease Brother   . Diabetes Brother   . Colon polyps Brother   . Heart disease Brother   . Diabetes Brother   . Colon polyps Brother   . Colon cancer Maternal Grandmother     dx in her 84s  . Leukemia Maternal Uncle 21  . Cancer Paternal Aunt     NOS  . Prostate cancer Paternal Uncle     dx in his 66s  . Breast cancer Other     dx in her 28s    History   Social History  . Marital Status: Married    Spouse Name: N/A  . Number of Children: N/A  . Years of Education: N/A   Occupational History  . Not on file.   Social History Main Topics  . Smoking status: Never Smoker   . Smokeless tobacco: Never Used  . Alcohol Use: No  . Drug Use: No  . Sexual Activity: Yes  Comment: lives with husband, no dietary restrictions   G2 P 2   Other Topics Concern  . Not on file   Social History Narrative   Married- 40 years   Never Smoked   Alcohol use-no   Drug use-no   Daily Caffeine Use   Occupation: housewife   Caffeine use/day:  None   Does Patient Exercise:  yes    Current Outpatient Prescriptions on File Prior to Visit  Medication Sig Dispense Refill  . albuterol (PROAIR HFA) 108 (90 BASE) MCG/ACT inhaler Inhale 2 puffs into the lungs 2 (two) times daily as needed. 3 Inhaler 2  . albuterol (PROVENTIL) (2.5 MG/3ML) 0.083% nebulizer solution Take 3 mLs (2.5 mg total) by nebulization every 6 (six) hours as needed for wheezing or shortness of breath. 150 mL 1  . amLODipine (NORVASC) 10 MG tablet Take 1 tablet (10 mg total) by mouth daily. 90 tablet 2  . aspirin 81 MG tablet Take 81 mg by mouth daily.      Marland Kitchen atorvastatin (LIPITOR) 20 MG tablet Take 10 mg by mouth daily.    . benazepril (LOTENSIN) 40 MG tablet Take 1 tablet (40 mg total) by mouth daily. 90 tablet 2  .  budesonide-formoterol (SYMBICORT) 160-4.5 MCG/ACT inhaler Inhale 2 puffs into the lungs 2 (two) times daily. 3 Inhaler 2  . cyclobenzaprine (FLEXERIL) 10 MG tablet Take 1 tablet (10 mg total) by mouth 2 (two) times daily as needed for muscle spasms. 20 tablet 0  . diclofenac sodium (VOLTAREN) 1 % GEL Apply 2 g topically 4 (four) times daily as needed.     . diphenhydramine-acetaminophen (TYLENOL PM) 25-500 MG TABS Take 1 tablet by mouth at bedtime as needed.    . ferrous fumarate (HEMOCYTE) 325 (106 FE) MG TABS tablet Take 1 tablet daily on an empty stomach with OJ. 30 each 4  . fluticasone (FLONASE) 50 MCG/ACT nasal spray Place 2 sprays into the nose daily. 16 g 0  . furosemide (LASIX) 20 MG tablet Take 1 tablet (20 mg total) by mouth daily. 90 tablet 0  . gabapentin (NEURONTIN) 300 MG capsule Take 1 capsule (300 mg total) by mouth 3 (three) times daily. 20 capsule 0  . HYDROcodone-acetaminophen (NORCO/VICODIN) 5-325 MG per tablet Take 1-2 tablets by mouth every 6 (six) hours as needed. 15 tablet 0  . HYDROcodone-homatropine (HYCODAN) 5-1.5 MG/5ML syrup Take 5 mLs by mouth every 8 (eight) hours as needed for cough. 120 mL 0  . levothyroxine (SYNTHROID, LEVOTHROID) 50 MCG tablet Take 1 tablet (50 mcg total) by mouth daily before breakfast. 30 tablet 6  . meloxicam (MOBIC) 15 MG tablet Take 15 mg by mouth daily as needed.     . Multiple Vitamin (MULTIVITAMIN) tablet Take 1 tablet by mouth daily.    . nebivolol (BYSTOLIC) 10 MG tablet Take 1 tablet (10 mg total) by mouth 2 (two) times daily. 180 tablet 2  . pantoprazole (PROTONIX) 40 MG tablet Take 1 tablet (40 mg total) by mouth daily. 90 tablet 2  . pentoxifylline (TRENTAL) 400 MG CR tablet Take 400 mg by mouth 2 (two) times daily.     . Probiotic Product (ALIGN) 4 MG CAPS Take 1 capsule by mouth daily.     . sennosides-docusate sodium (SENOKOT-S) 8.6-50 MG tablet Take 1 tablet by mouth daily as needed for constipation.    . traMADol (ULTRAM) 50 MG  tablet Take 1-2 tablets by mouth as needed every 8 hours for pain. 30 tablet 0  .  triamcinolone (KENALOG) 0.1 % cream Apply 1 application topically. Apply topically to affected area once daily      No current facility-administered medications on file prior to visit.    Allergies  Allergen Reactions  . Oxycodone Swelling    Facial swelling and tightness  . Penicillins Swelling    Rash with welps  . Shellfish Allergy Hives, Shortness Of Breath and Swelling  . Iodine     Rash  . Sulfa Antibiotics Rash    Review of Systems  Review of Systems  Constitutional: Positive for malaise/fatigue. Negative for fever.  HENT: Negative for congestion.   Eyes: Negative for discharge.  Respiratory: Negative for shortness of breath.   Cardiovascular: Negative for chest pain, palpitations and leg swelling.  Gastrointestinal: Negative for nausea, abdominal pain and diarrhea.  Genitourinary: Negative for dysuria.  Musculoskeletal: Negative for falls.  Skin: Negative for rash.  Neurological: Negative for loss of consciousness and headaches.  Endo/Heme/Allergies: Negative for polydipsia.  Psychiatric/Behavioral: Negative for depression and suicidal ideas. The patient is not nervous/anxious and does not have insomnia.     Objective  BP 132/88 mmHg  Pulse 86  Temp(Src) 98.2 F (36.8 C) (Oral)  Resp 18  Wt 240 lb 12.8 oz (109.226 kg)  SpO2 95%  Physical Exam  Physical Exam  Constitutional: She is oriented to person, place, and time and well-developed, well-nourished, and in no distress. No distress.  HENT:  Head: Normocephalic and atraumatic.  Eyes: Conjunctivae are normal.  Neck: Neck supple. No thyromegaly present.  Cardiovascular: Normal rate, regular rhythm and normal heart sounds.   No murmur heard. Pulmonary/Chest: Effort normal and breath sounds normal. She has no wheezes.  Abdominal: She exhibits no distension and no mass.  Musculoskeletal: She exhibits no edema.    Lymphadenopathy:    She has no cervical adenopathy.  Neurological: She is alert and oriented to person, place, and time.  Skin: Skin is warm and dry. No rash noted. She is not diaphoretic.  Psychiatric: Memory, affect and judgment normal.    Lab Results  Component Value Date   TSH 5.22* 02/17/2015   Lab Results  Component Value Date   WBC 4.0 12/23/2014   HGB 9.6* 12/23/2014   HCT 31.1* 12/23/2014   MCV 71.5* 12/23/2014   PLT 155 Large platelets present 12/23/2014   Lab Results  Component Value Date   CREATININE 0.77 02/17/2015   BUN 12 02/17/2015   NA 142 02/17/2015   K 3.9 02/17/2015   CL 107 02/17/2015   CO2 26 02/17/2015   Lab Results  Component Value Date   ALT 20 02/17/2015   AST 20 02/17/2015   ALKPHOS 67 02/17/2015   BILITOT 0.3 02/17/2015   Lab Results  Component Value Date   CHOL 167 02/17/2015   Lab Results  Component Value Date   HDL 44.50 02/17/2015   Lab Results  Component Value Date   LDLCALC 99 02/17/2015   Lab Results  Component Value Date   TRIG 119.0 02/17/2015   Lab Results  Component Value Date   CHOLHDL 4 02/17/2015     Assessment & Plan  Obesity Encouraged DASH diet, decrease po intake and increase exercise as tolerated. Needs 7-8 hours of sleep nightly. Avoid trans fats, eat small, frequent meals every 4-5 hours with lean proteins, complex carbs and healthy fats. Minimize simple carbs, GMO foods.  GERD Avoid offending foods, start probiotics. Do not eat large meals in late evening and consider raising head of bed.  Essential hypertension Well controlled, no changes to meds. Encouraged heart healthy diet such as the DASH diet and exercise as tolerated.   Diabetes mellitus type 2, controlled hgba1c acceptable, minimize simple carbs. Increase exercise as tolerated. Continue current meds  Endometrial cancer Is tolerating Chemo, hs a short episode of achiness and fatigue after each treatment.

## 2015-02-21 ENCOUNTER — Telehealth: Payer: Self-pay

## 2015-02-21 NOTE — Telephone Encounter (Signed)
Told [name] the results of her labs as noted below by Dr. Marko Plume.  [name] is on lasix 20 meq daily.  Reviewed foods rich in potassium with patient eg; OJ, strawberries, skins of white potatoes, and cantaloupe. [name] stated that she has not heard from scheduling about a CT scan of A/P to be done ~02-21-15.  Told her that this nurse would follow up call her back.

## 2015-02-21 NOTE — Telephone Encounter (Signed)
-----   Message from Gordy Levan, MD sent at 02/18/2015  2:11 PM EDT ----- Labs seen and need follow up: labs done with PAC flush today. Counts stable since completing chemo and RT. Chemistries ok except K a little low - if she is using lasix, that is likely cause. She needs to increase K in diet and I have cc'd these labs to PCP.

## 2015-02-24 ENCOUNTER — Encounter: Payer: Self-pay | Admitting: Oncology

## 2015-02-24 ENCOUNTER — Ambulatory Visit (INDEPENDENT_AMBULATORY_CARE_PROVIDER_SITE_OTHER): Payer: Commercial Managed Care - HMO

## 2015-02-24 VITALS — HR 101 | Temp 97.9°F | Ht 62.75 in | Wt 238.0 lb

## 2015-02-24 DIAGNOSIS — Z0000 Encounter for general adult medical examination without abnormal findings: Secondary | ICD-10-CM

## 2015-02-24 DIAGNOSIS — B351 Tinea unguium: Secondary | ICD-10-CM

## 2015-02-24 LAB — HM DIABETES EYE EXAM

## 2015-02-24 NOTE — Progress Notes (Addendum)
Subjective:   Katelyn Fisher is a 64 y.o. female who presents for an Initial Medicare Annual Wellness Visit.  Review of Systems     Cardiac Risk Factors include: advanced age (>97men, >65 women);obesity (BMI >30kg/m2);diabetes mellitus;dyslipidemia;hypertension;sedentary lifestyle   Sleep patterns:   Sleeps approximately 8 hours per night Vision:  No vision changes  Fall Risk:  LOW risk  Home Safety/Smoke Alarms:  Feels safe at home.  Lives with husband.  Smoke alarm and security system present.   Firearm Safety: none Seat Belt Safety/Bike Helmet:  Always wears seat belt.    Counseling:   Eye Exam- June 2015- Dr. Nonnie Done at [facility], requested records from office.   Dental-  August 2015 - Dr. Colin Rhein  Female:  Pap- Total hysterectomy    Mammo-UTD      Dexa scan-01/11/12       CCS-UTD Immunizations- Wants to wait until next appointment.       Objective:    Today's Vitals   02/24/15 0940  Pulse: 101  Temp: 97.9 F (36.6 C)  TempSrc: Oral  Height: 5' 2.75" (1.594 m)  Weight: 238 lb (107.956 kg)  SpO2: 98%  PainSc: 10-Worst pain ever  PainLoc: Leg    Current Medications (verified) Outpatient Encounter Prescriptions as of 02/24/2015  Medication Sig  . albuterol (PROAIR HFA) 108 (90 BASE) MCG/ACT inhaler Inhale 2 puffs into the lungs 2 (two) times daily as needed.  Marland Kitchen albuterol (PROVENTIL) (2.5 MG/3ML) 0.083% nebulizer solution Take 3 mLs (2.5 mg total) by nebulization every 6 (six) hours as needed for wheezing or shortness of breath.  Marland Kitchen amLODipine (NORVASC) 10 MG tablet Take 1 tablet (10 mg total) by mouth daily.  Marland Kitchen aspirin 81 MG tablet Take 81 mg by mouth daily.    Marland Kitchen atorvastatin (LIPITOR) 20 MG tablet Take 10 mg by mouth daily.  . benazepril (LOTENSIN) 40 MG tablet Take 1 tablet (40 mg total) by mouth daily.  . budesonide-formoterol (SYMBICORT) 160-4.5 MCG/ACT inhaler Inhale 2 puffs into the lungs 2 (two) times daily.  . cyclobenzaprine (FLEXERIL) 10 MG tablet Take 1 tablet  (10 mg total) by mouth 2 (two) times daily as needed for muscle spasms.  . diclofenac sodium (VOLTAREN) 1 % GEL Apply 2 g topically 4 (four) times daily as needed.   . ferrous fumarate (HEMOCYTE) 325 (106 FE) MG TABS tablet Take 1 tablet daily on an empty stomach with OJ.  . fluticasone (FLONASE) 50 MCG/ACT nasal spray Place 2 sprays into the nose daily.  . furosemide (LASIX) 20 MG tablet Take 1 tablet (20 mg total) by mouth daily.  Marland Kitchen gabapentin (NEURONTIN) 300 MG capsule Take 1 capsule (300 mg total) by mouth 3 (three) times daily.  Marland Kitchen HYDROcodone-acetaminophen (NORCO/VICODIN) 5-325 MG per tablet Take 1-2 tablets by mouth every 6 (six) hours as needed.  Marland Kitchen levothyroxine (SYNTHROID, LEVOTHROID) 50 MCG tablet 1 PILL DAILY FOR 5 DAYS A WEEK AND 2 PILLS A DAY ON Wednesday AND Saturday  . meloxicam (MOBIC) 15 MG tablet Take 15 mg by mouth daily as needed.   . Multiple Vitamin (MULTIVITAMIN) tablet Take 1 tablet by mouth daily.  . nebivolol (BYSTOLIC) 10 MG tablet Take 1 tablet (10 mg total) by mouth 2 (two) times daily.  . pantoprazole (PROTONIX) 40 MG tablet Take 1 tablet (40 mg total) by mouth daily.  . pentoxifylline (TRENTAL) 400 MG CR tablet Take 400 mg by mouth 2 (two) times daily.   . Probiotic Product (ALIGN) 4 MG CAPS Take 1  capsule by mouth daily.   . sennosides-docusate sodium (SENOKOT-S) 8.6-50 MG tablet Take 1 tablet by mouth daily as needed for constipation.  . traMADol (ULTRAM) 50 MG tablet Take 1-2 tablets by mouth as needed every 8 hours for pain.  Marland Kitchen triamcinolone (KENALOG) 0.1 % cream Apply 1 application topically. Apply topically to affected area once daily   . diphenhydramine-acetaminophen (TYLENOL PM) 25-500 MG TABS Take 1 tablet by mouth at bedtime as needed.  Marland Kitchen HYDROcodone-homatropine (HYCODAN) 5-1.5 MG/5ML syrup Take 5 mLs by mouth every 8 (eight) hours as needed for cough. (Patient not taking: Reported on 02/24/2015)   No facility-administered encounter medications on file as of  02/24/2015.    Allergies (verified) Oxycodone; Penicillins; Shellfish allergy; Iodine; and Sulfa antibiotics   History: Past Medical History  Diagnosis Date  . Asthma   . Hypertension   . Obesity   . Hx of colonic polyps   . GERD (gastroesophageal reflux disease)   . History of colonic diverticulitis   . Arthritis     back  . Hyperlipidemia   . Depression   . Low back pain radiating to right leg 08/18/2012  . Anxiety   . Benign paroxysmal positional vertigo 11/01/2012  . Thyroid disease 06/01/2013  . Cervical cancer screening 10/09/2010    Qualifier: Diagnosis of  By: Wynona Luna   . 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  . Preventative health care 12/09/2013  . Family history of breast cancer   . Family history of ovarian cancer   . Family history of prostate cancer   . Family history of colon cancer   . Radiation 12/17/14-01/03/15    vaginal brachytherapy  . Cancer     endometrial   Past Surgical History  Procedure Laterality Date  . Cholecystectomy  1991  . Dilation and curettage of uterus    . Tubal ligation    . Appendectomy    . Abdominal hysterectomy  06/23/14    [facility], TRH/BSO   Family History  Problem Relation Age of Onset  . Heart disease Maternal Aunt     x 3 -2 brothers  . Hypertension Maternal Aunt   . Breast cancer Maternal Aunt     maternal half; dx in her 32s  . Ovarian cancer Maternal Aunt     dx in her 53s  . Prostate cancer Father 68  . Diabetes Father   . Cancer Father     lung cancer, asbestos exposure  . Heart disease Brother   . Diabetes Brother   . Other      no FH colon cancer  . Diabetes Mother   . Lupus Mother   . Heart disease Brother   . Hyperlipidemia Sister   . Heart disease Brother   . Diabetes Brother   . Colon polyps Brother   . Heart disease Brother   . Diabetes Brother   . Colon polyps Brother   . Colon cancer Maternal Grandmother     dx in her 78s  . Leukemia Maternal Uncle 21  . Cancer Paternal Aunt     NOS  .  Prostate cancer Paternal Uncle     dx in his 64s  . Breast cancer Other     dx in her 85s   Social History   Occupational History  . Not on file.   Social History Main Topics  . Smoking status: Never Smoker   . Smokeless tobacco: Never Used  . Alcohol Use: No  . Drug Use: No  .  Sexual Activity:    Partners: Male     Comment: lives with husband, no dietary restrictions   G2 P 2    Tobacco Counseling Counseling given: Not Answered   Activities of Daily Living In your present state of health, do you have any difficulty performing the following activities: 02/24/2015 11/25/2014  Hearing? N N  Vision? N N  Difficulty concentrating or making decisions? N N  Walking or climbing stairs? Y N  Dressing or bathing? N N  Doing errands, shopping? Tempie Donning  Preparing Food and eating ? N N  Using the Toilet? N N  In the past six months, have you accidently leaked urine? Y N  Do you have problems with loss of bowel control? N N  Managing your Medications? N N  Managing your Finances? N N  Housekeeping or managing your Housekeeping? N Y    Immunizations and Health Maintenance Immunization History  Administered Date(s) Administered  . Influenza Split 06/11/2011, 05/21/2012  . Influenza Whole 06/24/2007, 04/16/2008, 05/02/2010  . Influenza,inj,Quad PF,36+ Mos 06/01/2013  . Influenza-Unspecified 05/23/2014  . Td 09/26/2007   Health Maintenance Due  Topic Date Due  . INFLUENZA VACCINE  02/21/2015    Patient Care Team: Mosie Lukes, MD as PCP - General (Family Medicine) Suella Broad, MD as Consulting Physician (Physical Medicine and Rehabilitation) Gordy Levan, MD as Consulting Physician (Oncology) Janie Morning, MD as Attending Physician (Obstetrics and Gynecology) Eppie Gibson, MD as Attending Physician (Radiation Oncology)  Indicate any recent Medical Services you may have received from other than [facility] providers in the past year (date may be approximate).     Assessment:    This is a routine wellness examination for Katelyn Fisher.   Hearing/Vision screen  Hearing Screening   125Hz  250Hz  500Hz  1000Hz  2000Hz  4000Hz  8000Hz   Right ear:   100      Left ear:   100        Dietary issues and exercise activities discussed: Type of exercise: Other - see comments, Time (Minutes): 30, Frequency (Times/Week): 5, Weekly Exercise (Minutes/Week): 150, Intensity: Mild   Diet (meal preparation, eat out, water intake, caffeinated beverages, dairy products, fruits and vegetables): "A lot of carbs." Some fruits/veggies.  Drinks plenty of water.  Coffee only during winter month.  No sodas.      Goals    . Increase physical activity     Continue chair exercises and maybe implement Walk Away the Pounds [name]     . maintain health    . Reduce portion size      Depression Screen PHQ 2/9 Scores 02/24/2015 02/01/2015 12/29/2014 11/25/2014 10/22/2014  PHQ - 2 Score 0 0 0 0 0    Fall Risk Fall Risk  02/24/2015 02/01/2015 12/29/2014 12/17/2014 11/25/2014  Falls in the past year? Yes No No No No  Number falls in past yr: 1 - - - -  Injury with Fall? No - - - -  Risk for fall due to : Impaired balance/gait - - - -  Risk for fall due to (comments): due to pain in right leg - - - -  Follow up Education provided - - - -    Cognitive Function: Alert and oriented.  Conversation appropriate.  AD8 score 0.    Screening Tests Health Maintenance  Topic Date Due  . INFLUENZA VACCINE  02/21/2015  . PNEUMOCOCCAL POLYSACCHARIDE VACCINE (1) 05/30/2015 (Originally 12/23/1952)  . URINE MICROALBUMIN  05/30/2015 (Originally 08/14/2013)  . ZOSTAVAX  05/30/2015 (Originally  12/24/2011)  . HIV Screening  07/04/2015 (Originally 12/24/1965)  . HEMOGLOBIN A1C  08/21/2015  . OPHTHALMOLOGY EXAM  01/17/2016  . FOOT EXAM  02/24/2016  . MAMMOGRAM  11/08/2016  . COLONOSCOPY  11/15/2016  . PAP SMEAR  12/07/2016  . TETANUS/TDAP  09/25/2017  . Hepatitis C Screening  Completed     Plan:  Follow up with PCP in  3 months.  Needs Pneumonia and Shingles vaccine and Urine Microalbumin.    Eat healthy meals that include lean protein, whole grains, fruits/veggies and healthy snacks.    Watch portion sizes/count carbs.  Continue chair exercises.    Try Griselda Miner Away The Pounds (videos on YouTube).     During the course of the visit, Camree was educated and counseled about the following appropriate screening and preventive services:   Vaccines to include Pneumoccal, Influenza, Hepatitis B, Td, Zostavax, HCV  Electrocardiogram  Cardiovascular disease screening  Colorectal cancer screening  Bone density screening  Diabetes screening  Glaucoma screening  Mammography/PAP  Nutrition counseling  Smoking cessation counseling  Patient Instructions (the written plan) were given to the patient.   Katelyn Anda, RN   03/02/2015

## 2015-02-24 NOTE — Patient Instructions (Addendum)
Eat healthy meals that include lean protein, whole grains, fruits/veggies and healthy snacks.    Watch portion sizes/count carbs.  Continue chair exercises.    Try Griselda Miner Away The Pounds (videos on YouTube).    You look GREAT!!!     Fat and Cholesterol Control Diet Your diet has an affect on your fat and cholesterol levels in your blood and organs. Too much fat and cholesterol in your blood can affect your:  Heart.  Blood vessels (arteries, veins).  Gallbladder.  Liver.  Pancreas. CONTROL FAT AND CHOLESTEROL WITH DIET Certain foods raise cholesterol and others lower it. It is important to replace bad fats with other types of fat.  Do not eat:  Fatty meats, such as hot dogs and salami.  Stick margarine and some tub margarines that have "partially hydrogenated oils" in them.  Baked goods, such as cookies and crackers that have "partially hydrogenated oils" in them.  Saturated tropical oils, such as coconut and palm oil. Eat the following foods:  Round or loin cuts of red meat.  Chicken (without skin).  Fish.  Veal.  [place] breast.  Shellfish.  Fruit, such as apples.  Vegetables, such as broccoli, potatoes, and carrots.  Beans, peas, and lentils (legumes).  Grains, such as barley, rice, couscous, and bulgar wheat.  Pasta (without cream sauces). Look for foods that are nonfat, low in fat, and low in cholesterol.  FIND FOODS THAT ARE LOWER IN FAT AND CHOLESTEROL  Find foods with soluble fiber and plant sterols (phytosterol). You should eat 2 grams a day of these foods. These foods include:  Fruits.  Vegetables.  Whole grains.  Dried beans and peas.  Nuts and seeds.  Read package labels. Look for low-saturated fats, trans fat free, low-fat foods.  Choose cheese that have only 2 to 3 grams of saturated fat per ounce.  Use heart-healthy tub margarine that is free of trans fat or partially hydrogenated oil.  Avoid buying baked  goods that have partially hydrogenated oils in them. Instead, buy baked goods made with whole grains (whole-wheat or whole oat flour). Avoid baked goods labeled with "flour" or "enriched flour."  Buy non-creamy canned soups with reduced salt and no added fats. PREPARING YOUR FOOD  Broil, bake, steam, or roast foods. Do not fry food.  Use non-stick cooking sprays.  Use lemon or herbs to flavor food instead of using butter or stick margarine.  Use nonfat yogurt, salsa, or low-fat dressings for salads. LOW-SATURATED FAT / LOW-FAT FOOD SUBSTITUTES  Meats / Saturated Fat (g)  Avoid: Steak, marbled (3 oz/85 g) / 11 g.  Choose: Steak, lean (3 oz/85 g) / 4 g.  Avoid: Hamburger (3 oz/85 g) / 7 g.  Choose: Hamburger, lean (3 oz/85 g) / 5 g.  Avoid: Ham (3 oz/85 g) / 6 g.  Choose: Ham, lean cut (3 oz/85 g) / 2.4 g.  Avoid: Chicken, with skin, dark meat (3 oz/85 g) / 4 g.  Choose: Chicken, skin removed, dark meat (3 oz/85 g) / 2 g.  Avoid: Chicken, with skin, light meat (3 oz/85 g) / 2.5 g.  Choose: Chicken, skin removed, light meat (3 oz/85 g) / 1 g. Dairy / Saturated Fat (g)  Avoid: Whole milk (1 cup) / 5 g.  Choose: Low-fat milk, 2% (1 cup) / 3 g.  Choose: Low-fat milk, 1% (1 cup) / 1.5 g.  Choose: Skim milk (1 cup) / 0.3 g.  Avoid: Hard cheese (1 oz/28 g) / 6  g.  Choose: Skim milk cheese (1 oz/28 g) / 2 to 3 g.  Avoid: Cottage cheese, 4% fat (1 cup) / 6.5 g.  Choose: Low-fat cottage cheese, 1% fat (1 cup) / 1.5 g.  Avoid: Ice cream (1 cup) / 9 g.  Choose: Sherbet (1 cup) / 2.5 g.  Choose: Nonfat frozen yogurt (1 cup) / 0.3 g.  Choose: Frozen fruit bar / trace.  Avoid: Whipped cream (1 tbs) / 3.5 g.  Choose: Nondairy whipped topping (1 tbs) / 1 g. Condiments / Saturated Fat (g)  Avoid: Mayonnaise (1 tbs) / 2 g.  Choose: Low-fat mayonnaise (1 tbs) / 1 g.  Avoid: Butter (1 tbs) / 7 g.  Choose: Extra light margarine (1 tbs) / 1 g.  Avoid: Coconut oil (1  tbs) / 11.8 g.  Choose: Olive oil (1 tbs) / 1.8 g.  Choose: Corn oil (1 tbs) / 1.7 g.  Choose: Safflower oil (1 tbs) / 1.2 g.  Choose: Sunflower oil (1 tbs) / 1.4 g.  Choose: Soybean oil (1 tbs) / 2.4 g .  Choose: Canola oil (1 tbs) / 1 g. Document Released: 01/08/2012 Document Revised: 03/11/2013 Document Reviewed: 10/08/2013 [facility] Patient Information 2015 [place], [place]. This information is not intended to replace advice given to you by your health care provider. Make sure you discuss any questions you have with your health care provider.  Health Maintenance Adopting a healthy lifestyle and getting preventive care can go a long way to promote health and wellness. Talk with your health care provider about what schedule of regular examinations is right for you. This is a good chance for you to check in with your provider about disease prevention and staying healthy. In between checkups, there are plenty of things you can do on your own. Experts have done a lot of research about which lifestyle changes and preventive measures are most likely to keep you healthy. Ask your health care provider for more information. WEIGHT AND DIET  Eat a healthy diet  Be sure to include plenty of vegetables, fruits, low-fat dairy products, and lean protein.  Do not eat a lot of foods high in solid fats, added sugars, or salt.  Get regular exercise. This is one of the most important things you can do for your health.  Most adults should exercise for at least 150 minutes each week. The exercise should increase your heart rate and make you sweat (moderate-intensity exercise).  Most adults should also do strengthening exercises at least twice a week. This is in addition to the moderate-intensity exercise.  Maintain a healthy weight  Body mass index (BMI) is a measurement that can be used to identify possible weight problems. It estimates body fat based on height and weight. Your health care provider  can help determine your BMI and help you achieve or maintain a healthy weight.  For females 46 years of age and older:   A BMI below 18.5 is considered underweight.  A BMI of 18.5 to 24.9 is normal.  A BMI of 25 to 29.9 is considered overweight.  A BMI of 30 and above is considered obese.  Watch levels of cholesterol and blood lipids  You should start having your blood tested for lipids and cholesterol at 63 years of age, then have this test every 5 years.  You may need to have your cholesterol levels checked more often if:  Your lipid or cholesterol levels are high.  You are older than 63 years of age.  You are at high risk for heart disease.  CANCER SCREENING   Lung Cancer  Lung cancer screening is recommended for adults 56-57 years old who are at high risk for lung cancer because of a history of smoking.  A yearly low-dose CT scan of the lungs is recommended for people who:  Currently smoke.  Have quit within the past 15 years.  Have at least a 30-pack-year history of smoking. A pack year is smoking an average of one pack of cigarettes a day for 1 year.  Yearly screening should continue until it has been 15 years since you quit.  Yearly screening should stop if you develop a health problem that would prevent you from having lung cancer treatment.  Breast Cancer  Practice breast self-awareness. This means understanding how your breasts normally appear and feel.  It also means doing regular breast self-exams. Let your health care provider know about any changes, no matter how small.  If you are in your 20s or 30s, you should have a clinical breast exam (CBE) by a health care provider every 1-3 years as part of a regular health exam.  If you are 25 or older, have a CBE every year. Also consider having a breast X-ray (mammogram) every year.  If you have a family history of breast cancer, talk to your health care provider about genetic screening.  If you are at  high risk for breast cancer, talk to your health care provider about having an MRI and a mammogram every year.  Breast cancer gene (BRCA) assessment is recommended for women who have family members with BRCA-related cancers. BRCA-related cancers include:  Breast.  Ovarian.  Tubal.  Peritoneal cancers.  Results of the assessment will determine the need for genetic counseling and BRCA1 and BRCA2 testing. Cervical Cancer Routine pelvic examinations to screen for cervical cancer are no longer recommended for nonpregnant women who are considered low risk for cancer of the pelvic organs (ovaries, uterus, and vagina) and who do not have symptoms. A pelvic examination may be necessary if you have symptoms including those associated with pelvic infections. Ask your health care provider if a screening pelvic exam is right for you.   The Pap test is the screening test for cervical cancer for women who are considered at risk.  If you had a hysterectomy for a problem that was not cancer or a condition that could lead to cancer, then you no longer need Pap tests.  If you are older than 65 years, and you have had normal Pap tests for the past 10 years, you no longer need to have Pap tests.  If you have had past treatment for cervical cancer or a condition that could lead to cancer, you need Pap tests and screening for cancer for at least 20 years after your treatment.  If you no longer get a Pap test, assess your risk factors if they change (such as having a new sexual partner). This can affect whether you should start being screened again.  Some women have medical problems that increase their chance of getting cervical cancer. If this is the case for you, your health care provider may recommend more frequent screening and Pap tests.  The human papillomavirus (HPV) test is another test that may be used for cervical cancer screening. The HPV test looks for the virus that can cause cell changes in the  cervix. The cells collected during the Pap test can be tested for HPV.  The HPV test can  be used to screen women 74 years of age and older. Getting tested for HPV can extend the interval between normal Pap tests from three to five years.  An HPV test also should be used to screen women of any age who have unclear Pap test results.  After 63 years of age, women should have HPV testing as often as Pap tests.  Colorectal Cancer  This type of cancer can be detected and often prevented.  Routine colorectal cancer screening usually begins at 63 years of age and continues through 63 years of age.  Your health care provider may recommend screening at an earlier age if you have risk factors for colon cancer.  Your health care provider may also recommend using home test kits to check for hidden blood in the stool.  A small camera at the end of a tube can be used to examine your colon directly (sigmoidoscopy or colonoscopy). This is done to check for the earliest forms of colorectal cancer.  Routine screening usually begins at age 40.  Direct examination of the colon should be repeated every 5-10 years through 63 years of age. However, you may need to be screened more often if early forms of precancerous polyps or small growths are found. Skin Cancer  Check your skin from head to toe regularly.  Tell your health care provider about any new moles or changes in moles, especially if there is a change in a mole's shape or color.  Also tell your health care provider if you have a mole that is larger than the size of a pencil eraser.  Always use sunscreen. Apply sunscreen liberally and repeatedly throughout the day.  Protect yourself by wearing long sleeves, pants, a wide-brimmed hat, and sunglasses whenever you are outside. HEART DISEASE, DIABETES, AND HIGH BLOOD PRESSURE   Have your blood pressure checked at least every 1-2 years. High blood pressure causes heart disease and increases the risk  of stroke.  If you are between 43 years and 77 years old, ask your health care provider if you should take aspirin to prevent strokes.  Have regular diabetes screenings. This involves taking a blood sample to check your fasting blood sugar level.  If you are at a normal weight and have a low risk for diabetes, have this test once every three years after 63 years of age.  If you are overweight and have a high risk for diabetes, consider being tested at a younger age or more often. PREVENTING INFECTION  Hepatitis B  If you have a higher risk for hepatitis B, you should be screened for this virus. You are considered at high risk for hepatitis B if:  You were born in a country where hepatitis B is common. Ask your health care provider which countries are considered high risk.  Your parents were born in a high-risk country, and you have not been immunized against hepatitis B (hepatitis B vaccine).  You have HIV or AIDS.  You use needles to inject street drugs.  You live with someone who has hepatitis B.  You have had sex with someone who has hepatitis B.  You get hemodialysis treatment.  You take certain medicines for conditions, including cancer, organ transplantation, and autoimmune conditions. Hepatitis C  Blood testing is recommended for:  Everyone born from [birth year] through 1964.  Anyone with known risk factors for hepatitis C. Sexually transmitted infections (STIs)  You should be screened for sexually transmitted infections (STIs) including gonorrhea and chlamydia if:  You  are sexually active and are younger than 63 years of age.  You are older than 63 years of age and your health care provider tells you that you are at risk for this type of infection.  Your sexual activity has changed since you were last screened and you are at an increased risk for chlamydia or gonorrhea. Ask your health care provider if you are at risk.  If you do not have HIV, but are at risk, it may be  recommended that you take a prescription medicine daily to prevent HIV infection. This is called pre-exposure prophylaxis (PrEP). You are considered at risk if:  You are sexually active and do not regularly use condoms or know the HIV status of your partner(s).  You take drugs by injection.  You are sexually active with a partner who has HIV. Talk with your health care provider about whether you are at high risk of being infected with HIV. If you choose to begin PrEP, you should first be tested for HIV. You should then be tested every 3 months for as long as you are taking PrEP.  PREGNANCY   If you are premenopausal and you may become pregnant, ask your health care provider about preconception counseling.  If you may become pregnant, take 400 to 800 micrograms (mcg) of folic acid every day.  If you want to prevent pregnancy, talk to your health care provider about birth control (contraception). OSTEOPOROSIS AND MENOPAUSE   Osteoporosis is a disease in which the bones lose minerals and strength with aging. This can result in serious bone fractures. Your risk for osteoporosis can be identified using a bone density scan.  If you are 85 years of age or older, or if you are at risk for osteoporosis and fractures, ask your health care provider if you should be screened.  Ask your health care provider whether you should take a calcium or vitamin D supplement to lower your risk for osteoporosis.  Menopause may have certain physical symptoms and risks.  Hormone replacement therapy may reduce some of these symptoms and risks. Talk to your health care provider about whether hormone replacement therapy is right for you.  HOME CARE INSTRUCTIONS   Schedule regular health, dental, and eye exams.  Stay current with your immunizations.   Do not use any tobacco products including cigarettes, chewing tobacco, or electronic cigarettes.  If you are pregnant, do not drink alcohol.  If you are  breastfeeding, limit how much and how often you drink alcohol.  Limit alcohol intake to no more than 1 drink per day for nonpregnant women. One drink equals 12 ounces of beer, 5 ounces of wine, or 1 ounces of hard liquor.  Do not use street drugs.  Do not share needles.  Ask your health care provider for help if you need support or information about quitting drugs.  Tell your health care provider if you often feel depressed.  Tell your health care provider if you have ever been abused or do not feel safe at home. Document Released: 01/22/2011 Document Revised: 11/23/2013 Document Reviewed: 06/10/2013 [facility] Patient Information 2015 [place], [place]. This information is not intended to replace advice given to you by your health care provider. Make sure you discuss any questions you have with your health care provider.

## 2015-02-24 NOTE — Progress Notes (Signed)
Pre visit review using our clinic review tool, if applicable. No additional management support is needed unless otherwise documented below in the visit note. 

## 2015-02-27 NOTE — Assessment & Plan Note (Signed)
hgba1c acceptable, minimize simple carbs. Increase exercise as tolerated. Continue current meds 

## 2015-02-27 NOTE — Assessment & Plan Note (Signed)
Well controlled, no changes to meds. Encouraged heart healthy diet such as the DASH diet and exercise as tolerated.  °

## 2015-02-27 NOTE — Assessment & Plan Note (Signed)
Is tolerating Chemo, hs a short episode of achiness and fatigue after each treatment.

## 2015-02-27 NOTE — Assessment & Plan Note (Signed)
Avoid offending foods, start probiotics. Do not eat large meals in late evening and consider raising head of bed.  

## 2015-02-27 NOTE — Assessment & Plan Note (Signed)
Encouraged DASH diet, decrease po intake and increase exercise as tolerated. Needs 7-8 hours of sleep nightly. Avoid trans fats, eat small, frequent meals every 4-5 hours with lean proteins, complex carbs and healthy fats. Minimize simple carbs, GMO foods. 

## 2015-03-01 ENCOUNTER — Telehealth: Payer: Self-pay | Admitting: Gynecologic Oncology

## 2015-03-01 NOTE — Telephone Encounter (Signed)
GYNECOLOGIC ONCOLOGY OFFICE NOTE   Chief Complaint: Uterine serous endometrial cancer. Surveillance   Assessment/Plan: Katelyn Fisher is a 64 y.o. woman with pre op diagnosis of grade 1 endometrial cancer . 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   PET/CT scan of the abdomen and pelvis to evaluate the para-aortic LN is without evidence of metastases Has received 6//6 cycles adjuvant platinum and taxane chemotherapy and  vaginal brachytherapy. Follow-up with Gyn Onc in 4 months Follow-up with Dr Marko Plume and Rad Onc at [facility] as scheduled   Referral to genetic counseling.   History of Present Illness: Katelyn Fisher is a 64 y.o. who presented with a two-week history of postmenopausal bleeding. A pelvic ultrasound showed fibroids and a thickened endometrial stripe. She subsequently underwent an endometrial biopsy which was concerning for at least grade 1 endometrioid adenocarcinoma.   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 US showed fibroids and thickened EMS. EMB showed "at least grade 1 endometriod adenoca" on EMB by primary gynecologist.    06/23/2014  Surgery  RATLH, BSO, BPLND. Findings: enlarged fibroid uterus, no e/o mets. IOFS: grade 1, focal LVSI. Final path: mixed serous (50%) and endometrioid (50%), 2.0cm size, 17% invasion, no cervical involvement, +LVSI, all pelvic LN neg.     Pathology: A: Uterus, cervix, bilateral ovaries and fallopian tubes, total hysterectomy and bilateral salpingo-oophorectomy  Histologic type: mixed high grade carcinoma (serous carcinoma 50%, endometrioid adenocarcinoma 50%)   Tumor size (gross): 2.0 cm (microscopic measurement)   Myometrial invasion: Inner half Depth: 5 mm Wall thickness: 3 cm Percent:  17% Lymphovascular space invasion: present   Endometrium: 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) predominantly   - Six lymph nodes negative for metastatic carcinoma (0/6)   C: Lymph nodes, left pelvic, excision  - Nine lymph nodes, negative for metastatic carcinoma (0/9)   Post op PET to assess LN without evidence of metastatic disease.  Has received 6 cycles taxol/Carbo completed 01/2015.   Radiation Treatment Dates: 12/17/14-01/03/15 Site/Dose: HDR Brachytherapy with 30 Gy in 5 fractions   Past Medical History:  Past Medical History   Past Medical History   Diagnosis  Date   .  Asthma    .  Morbid obesity (RAF-HCC)      BMI 44   .  Hypertension    .  GERD (gastroesophageal reflux disease)    .  Arthritis    .  Depression    .  Back pain    .  Thyroid disease  05/2013   .  Vertigo  10/2012     benign paroxysmal positional       Past Surgical History:  Past Surgical History   Past Surgical History   Procedure  Laterality  Date   .  Dilation and curettage of uterus   2102     for PMB   .  Hysteroscopy   2012     with resection of fibroids   .  Tubal ligation     .  Laparoscopic cholecystectomy     .  Pr laparoscopy w tot hysterectuterus <=250 gram w tube/ovary  Midline  06/23/2014     Procedure: ROBOTIC LAPAROSCOPY, SURGICAL, WITH TOTAL HYSTERECTOMY, FOR UTERUS 250 G OR LESS; W/REMOVAL TUBE(S) AND/OR OVARY(S); Surgeon: Abigail Butts  Helayne Seminole, MD; Location: MAIN OR [facility]; Service: Gynecology Oncology   .  Pr lap,pelvic lymphadenectomy/bx  Bilateral  06/23/2014     Procedure: ROBOTIC LAPAROSCOPY, SURGICAL; WITH BILATERAL TOTAL PELVIC LYMPHADENECTOMY PERI-AORTIC LYMPH NODE SAMPLE, SGL/MULT; Surgeon: Devra Dopp, MD; Location: MAIN OR [facility]; Service: Gynecology Oncology       Family History:  Family History   Family History   Problem  Relation  Age of Onset   .  Heart disease  Maternal Aunt    .  Lung cancer  Father    .  Cancer  Maternal Aunt   63     Breast   .  Cancer  Maternal Aunt  67     Breast   .  Diabetes  Mother    .  Lupus  Mother    .  Diabetes  Father    .  Prostate cancer  Father    .  Diabetes  Brother    .  Heart disease  Brother      ROS: No visual changes, no nausea or vomiting, no sensory or motor changes, no flank pain, no extremity edema, no incontinence, no vaginal bleeding. Tolerating chemo well.    Physical Exam: BP 128/87 mmHg  Pulse 85  Temp(Src) 98.2 F (36.8 C) (Oral)  Resp 18  Ht 5\' 2"  (1.575 m)  Wt 240 lb 8 oz (109.09 kg)  BMI 43.98 kg/m2 WD female in NAD Chest: CTA Back: No CVAT Abdomen: Obese. Soft, nondistended, nontender to palpation. No masses or hepatosplenomegaly appreciated.Port sites intact, no tenderness or masses  GU: Normal external female genitalia. No lesions. Cuff intact, no bleeding or discharge Back:  No CVAT LN:  No cervical supraclavicular or inguinal adenopathy Ext No CCE

## 2015-03-02 ENCOUNTER — Ambulatory Visit (HOSPITAL_COMMUNITY)
Admission: RE | Admit: 2015-03-02 | Discharge: 2015-03-02 | Disposition: A | Discharge status: Home / Self Care | Payer: Commercial Managed Care - HMO | Source: Ambulatory Visit | Attending: Oncology | Admitting: Oncology

## 2015-03-02 ENCOUNTER — Encounter (HOSPITAL_COMMUNITY): Payer: Self-pay

## 2015-03-02 ENCOUNTER — Ambulatory Visit (HOSPITAL_COMMUNITY): Payer: Commercial Managed Care - HMO

## 2015-03-02 DIAGNOSIS — C541 Malignant neoplasm of endometrium: Secondary | ICD-10-CM

## 2015-03-03 ENCOUNTER — Ambulatory Visit (HOSPITAL_BASED_OUTPATIENT_CLINIC_OR_DEPARTMENT_OTHER): Payer: Commercial Managed Care - HMO | Admitting: Gynecologic Oncology

## 2015-03-03 ENCOUNTER — Ambulatory Visit (HOSPITAL_COMMUNITY)
Admission: RE | Admit: 2015-03-03 | Discharge: 2015-03-03 | Disposition: A | Discharge status: Home / Self Care | Payer: Commercial Managed Care - HMO | Source: Ambulatory Visit | Attending: Oncology | Admitting: Oncology

## 2015-03-03 ENCOUNTER — Encounter: Payer: Self-pay | Admitting: Gynecologic Oncology

## 2015-03-03 ENCOUNTER — Encounter: Payer: Self-pay | Admitting: Family Medicine

## 2015-03-03 ENCOUNTER — Encounter (HOSPITAL_COMMUNITY): Payer: Self-pay

## 2015-03-03 VITALS — BP 113/92 | HR 87 | Temp 97.7°F | Resp 18 | Ht 62.75 in | Wt 235.9 lb

## 2015-03-03 DIAGNOSIS — Z90710 Acquired absence of both cervix and uterus: Secondary | ICD-10-CM | POA: Insufficient documentation

## 2015-03-03 DIAGNOSIS — M4317 Spondylolisthesis, lumbosacral region: Secondary | ICD-10-CM | POA: Insufficient documentation

## 2015-03-03 DIAGNOSIS — I10 Essential (primary) hypertension: Secondary | ICD-10-CM | POA: Insufficient documentation

## 2015-03-03 DIAGNOSIS — Z91041 Radiographic dye allergy status: Secondary | ICD-10-CM | POA: Diagnosis not present

## 2015-03-03 DIAGNOSIS — Z6841 Body Mass Index (BMI) 40.0 and over, adult: Secondary | ICD-10-CM | POA: Diagnosis not present

## 2015-03-03 DIAGNOSIS — Z923 Personal history of irradiation: Secondary | ICD-10-CM | POA: Diagnosis not present

## 2015-03-03 DIAGNOSIS — K5730 Diverticulosis of large intestine without perforation or abscess without bleeding: Secondary | ICD-10-CM | POA: Insufficient documentation

## 2015-03-03 DIAGNOSIS — C541 Malignant neoplasm of endometrium: Secondary | ICD-10-CM | POA: Diagnosis not present

## 2015-03-03 DIAGNOSIS — K449 Diaphragmatic hernia without obstruction or gangrene: Secondary | ICD-10-CM | POA: Insufficient documentation

## 2015-03-03 DIAGNOSIS — Z9221 Personal history of antineoplastic chemotherapy: Secondary | ICD-10-CM | POA: Diagnosis not present

## 2015-03-03 DIAGNOSIS — Z90722 Acquired absence of ovaries, bilateral: Secondary | ICD-10-CM | POA: Diagnosis not present

## 2015-03-03 DIAGNOSIS — K219 Gastro-esophageal reflux disease without esophagitis: Secondary | ICD-10-CM | POA: Insufficient documentation

## 2015-03-03 DIAGNOSIS — J45909 Unspecified asthma, uncomplicated: Secondary | ICD-10-CM | POA: Insufficient documentation

## 2015-03-03 DIAGNOSIS — K429 Umbilical hernia without obstruction or gangrene: Secondary | ICD-10-CM | POA: Insufficient documentation

## 2015-03-03 MED ORDER — IOHEXOL 300 MG/ML  SOLN
100.0000 mL | Freq: Once | INTRAMUSCULAR | Status: AC | PRN
  Administered 2015-03-03: 100 mL via INTRAVENOUS

## 2015-03-03 MED ORDER — IOHEXOL 300 MG/ML  SOLN
50.0000 mL | Freq: Once | INTRAMUSCULAR | Status: AC | PRN
  Administered 2015-03-03: 50 mL via ORAL

## 2015-03-03 NOTE — Patient Instructions (Signed)
Plan to follow up with Dr. Marko Plume in Sept, Dr. Skeet Latch in December, and Dr. Isidore Moos in March 2017.  Please call for any questions, concerns, or new symptoms.  Thank you for coming to see me today.  I appreciate your confidence in choosing [facility] for your medical care.  If you have any questions about your visit today, please call our office and we will get back to you as soon as possible.  Dr. Janie Morning Gynecologic Oncology

## 2015-03-03 NOTE — Progress Notes (Signed)
GYNECOLOGIC ONCOLOGY OFFICE NOTE   Chief Complaint: Uterine serous endometrial cancer. Surveillance   Assessment/Plan: Katelyn Fisher is a 64 y.o. woman with pre op diagnosis of grade 1 endometrial cancer . 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   PET/CT scan of the abdomen and pelvis to evaluate the para-aortic LN is without evidence of metastases Has received 6//6 cycles adjuvant platinum and taxane chemotherapy and  vaginal brachytherapy completed 12/2014 Follow-up with Gyn Onc 06/2015  Follow-up with Dr Marko Plume  03/2015 and Rad Onc at [facility] 09/2015 Follow-up with Dr. Dellis Filbert 12/2015     History of Present Illness: Katelyn Fisher is a 64 y.o. who presented with a two-week history of postmenopausal bleeding. A pelvic ultrasound showed fibroids and a thickened endometrial stripe. She subsequently underwent an endometrial biopsy which was concerning for at least grade 1 endometrioid adenocarcinoma.   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 US showed fibroids and thickened EMS. EMB showed "at least grade 1 endometriod adenoca" on EMB by primary gynecologist.    06/23/2014  Surgery  RATLH, BSO, BPLND. Findings: enlarged fibroid uterus, no e/o mets. IOFS: grade 1, focal LVSI. Final path: mixed serous (50%) and endometrioid (50%), 2.0cm size, 17% invasion, no cervical involvement, +LVSI, all pelvic LN neg.     Pathology: A: Uterus, cervix, bilateral ovaries and fallopian tubes, total hysterectomy and bilateral salpingo-oophorectomy  Histologic type: mixed high grade carcinoma (serous carcinoma 50%, endometrioid adenocarcinoma 50%)   Tumor size (gross): 2.0 cm (microscopic measurement)   Myometrial invasion: Inner half Depth: 5 mm Wall  thickness: 3 cm Percent: 17% Lymphovascular space invasion: present   Endometrium: 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) predominantly   - Six lymph nodes negative for metastatic carcinoma (0/6)   C: Lymph nodes, left pelvic, excision  - Nine lymph nodes, negative for metastatic carcinoma (0/9)   Post op PET to assess LN without evidence of metastatic disease.  Has received 6 cycles taxol/Carbo completed 01/2015.   Radiation Treatment Dates: 12/17/14-01/03/15 Site/Dose: HDR Brachytherapy with 30 Gy in 5 fractions Extended cancer gene panel negative.  CT 03/03/2015 IMPRESSION: 1. Interval hysterectomy. No evidence of local recurrence or metastatic disease. 2. No acute abdominal pelvic findings. 3. Distal colonic diverticulosis with possible rectal wall thickening, possibly related to prior radiation therapy. Correlate clinically.   Doing well  Past Medical History:  Past Medical History   Past Medical History   Diagnosis  Date   .  Asthma    .  Morbid obesity (RAF-HCC)      BMI 44   .  Hypertension    .  GERD (gastroesophageal reflux disease)    .  Arthritis    .  Depression    .  Back pain    .  Thyroid disease  05/2013   .  Vertigo  10/2012     benign paroxysmal positional       Past Surgical History:  Past Surgical History   Past Surgical History   Procedure  Laterality  Date   .  Dilation and curettage of uterus   2102     for PMB   .  Hysteroscopy   2012     with resection of fibroids   .  Tubal ligation     .  Laparoscopic cholecystectomy     .  Pr laparoscopy w tot hysterectuterus <=250 gram w tube/ovary  Midline  06/23/2014     Procedure: ROBOTIC LAPAROSCOPY, SURGICAL, WITH TOTAL HYSTERECTOMY, FOR UTERUS 250 G OR LESS; W/REMOVAL TUBE(S) AND/OR OVARY(S); Surgeon: Devra Dopp, MD; Location: MAIN OR [facility]; Service: Gynecology Oncology   .  Pr lap,pelvic lymphadenectomy/bx  Bilateral  06/23/2014     Procedure: ROBOTIC  LAPAROSCOPY, SURGICAL; WITH BILATERAL TOTAL PELVIC LYMPHADENECTOMY PERI-AORTIC LYMPH NODE SAMPLE, SGL/MULT; Surgeon: Devra Dopp, MD; Location: MAIN OR [facility]; Service: Gynecology Oncology       Family History:  Family History   Family History   Problem  Relation  Age of Onset   .  Heart disease  Maternal Aunt    .  Lung cancer  Father    .  Cancer  Maternal Aunt  93     Breast   .  Cancer  Maternal Aunt  73     Breast   .  Diabetes  Mother    .  Lupus  Mother    .  Diabetes  Father    .  Prostate cancer  Father    .  Diabetes  Brother    .  Heart disease  Brother      ROS: No visual changes, no nausea or vomiting, no sensory or motor changes, no flank pain, no extremity edema, no incontinence, no vaginal bleeding. No change in bowel or bladder habits, Otherwise 10 point ROS negative.   Physical Exam: BP 113/92 mmHg  Pulse 87  Temp(Src) 97.7 F (36.5 C) (Oral)  Resp 18  Ht 5' 2.75" (1.594 m)  Wt 235 lb 14.4 oz (107.004 kg)  BMI 42.11 kg/m2  SpO2 100% WD female in NAD Chest: CTA Back: No CVAT Abdomen: Obese. Soft, nondistended, nontender to palpation. No masses or hepatosplenomegaly appreciated.Port sites intact, no tenderness or masses  GU: Normal external female genitalia. No lesions. Cuff intact, no bleeding or discharge Rectal:  Good tone no masses. Back:  No CVAT LN:  No cervical supraclavicular or inguinal adenopathy Ext No CCE

## 2015-03-08 ENCOUNTER — Other Ambulatory Visit: Payer: Self-pay | Admitting: *Deleted

## 2015-03-08 NOTE — Patient Outreach (Signed)
Katelyn Fisher) Care Management   03/08/2015  Katelyn Fisher 1950-10-15 [id number]  Katelyn Fisher is an 64 y.o. female   Subjective:  HTN: Pt reports she is doing well in managing her HTN however continues to need reiteration on some symptoms. Pt continue to document all her BP readings in her calendar tool for her providers to view.  Pt continues to inquired on the symptoms of hypo-hypertension and denies any symptoms related to HTN.  Pt verified she is taking all her medication as prescribed. Pt also reports attendance to all her medical appointments as scheduled.  EDEMA:  Pt continues to reports swelling to both lower legs however wears her compression stockings that "helps a lot". Pt  Reports she continues to elevate her lower legs when needed to reduce ongoing swelling.  NUTRITION: Pt report she has been eating healthier food items and has loss 9 lbs over the last 3 weeks. Pt very proud of herself and determine to continue to losing the unwanted weight. Pt reports recent doctor's office visit with a good reports. Pt will follow up with her OB GYN in December. No additional issues or inquires have occurred.     Objective:   Review of Systems  All other systems reviewed and are negative.   Physical Exam  Constitutional: She is oriented to person, place, and time. She appears well-developed and well-nourished.  HENT:  Right Ear: External ear normal.  Left Ear: External ear normal.  Nose: Nose normal.  Neck: Normal range of motion.  Cardiovascular: Regular rhythm.   Respiratory: Effort normal and breath sounds normal.  GI: Soft. Bowel sounds are normal.  Musculoskeletal: Normal range of motion.  Neurological: She is alert and oriented to person, place, and time.  Skin: Skin is warm and dry.  Psychiatric: She has a normal mood and affect. Her behavior is normal. Judgment and thought content normal.    Current Medications:   Current Outpatient Prescriptions   Medication Sig Dispense Refill  . albuterol (PROAIR HFA) 108 (90 BASE) MCG/ACT inhaler Inhale 2 puffs into the lungs 2 (two) times daily as needed. 3 Inhaler 2  . albuterol (PROVENTIL) (2.5 MG/3ML) 0.083% nebulizer solution Take 3 mLs (2.5 mg total) by nebulization every 6 (six) hours as needed for wheezing or shortness of breath. 150 mL 1  . amLODipine (NORVASC) 10 MG tablet Take 1 tablet (10 mg total) by mouth daily. 90 tablet 2  . aspirin 81 MG tablet Take 81 mg by mouth daily.      Marland Kitchen atorvastatin (LIPITOR) 20 MG tablet Take 10 mg by mouth daily.    . benazepril (LOTENSIN) 40 MG tablet Take 1 tablet (40 mg total) by mouth daily. 90 tablet 2  . budesonide-formoterol (SYMBICORT) 160-4.5 MCG/ACT inhaler Inhale 2 puffs into the lungs 2 (two) times daily. 3 Inhaler 2  . cyclobenzaprine (FLEXERIL) 10 MG tablet Take 1 tablet (10 mg total) by mouth 2 (two) times daily as needed for muscle spasms. 20 tablet 0  . diclofenac sodium (VOLTAREN) 1 % GEL Apply 2 g topically 4 (four) times daily as needed.     . diphenhydramine-acetaminophen (TYLENOL PM) 25-500 MG TABS Take 1 tablet by mouth at bedtime as needed.    . ferrous fumarate (HEMOCYTE) 325 (106 FE) MG TABS tablet Take 1 tablet daily on an empty stomach with OJ. 30 each 4  . fluticasone (FLONASE) 50 MCG/ACT nasal spray Place 2 sprays into the nose daily. 16 g 0  . furosemide (  LASIX) 20 MG tablet Take 1 tablet (20 mg total) by mouth daily. 90 tablet 0  . gabapentin (NEURONTIN) 300 MG capsule Take 1 capsule (300 mg total) by mouth 3 (three) times daily. 20 capsule 0  . HYDROcodone-acetaminophen (NORCO/VICODIN) 5-325 MG per tablet Take 1-2 tablets by mouth every 6 (six) hours as needed. 15 tablet 0  . HYDROcodone-homatropine (HYCODAN) 5-1.5 MG/5ML syrup Take 5 mLs by mouth every 8 (eight) hours as needed for cough. 120 mL 0  . levothyroxine (SYNTHROID, LEVOTHROID) 50 MCG tablet 1 PILL DAILY FOR 5 DAYS A WEEK AND 2 PILLS A DAY ON Wednesday AND Saturday  105 tablet 1  . meloxicam (MOBIC) 15 MG tablet Take 15 mg by mouth daily as needed.     . Multiple Vitamin (MULTIVITAMIN) tablet Take 1 tablet by mouth daily.    . nebivolol (BYSTOLIC) 10 MG tablet Take 1 tablet (10 mg total) by mouth 2 (two) times daily. 180 tablet 2  . pantoprazole (PROTONIX) 40 MG tablet Take 1 tablet (40 mg total) by mouth daily. 90 tablet 2  . pentoxifylline (TRENTAL) 400 MG CR tablet Take 400 mg by mouth 2 (two) times daily.     . Probiotic Product (ALIGN) 4 MG CAPS Take 1 capsule by mouth daily.     . sennosides-docusate sodium (SENOKOT-S) 8.6-50 MG tablet Take 1 tablet by mouth daily as needed for constipation.    . traMADol (ULTRAM) 50 MG tablet Take 1-2 tablets by mouth as needed every 8 hours for pain. 30 tablet 0  . triamcinolone (KENALOG) 0.1 % cream Apply 1 application topically. Apply topically to affected area once daily      No current facility-administered medications for this visit.    Functional Status:   In your present state of health, do you have any difficulty performing the following activities: 02/24/2015 11/25/2014  Hearing? N N  Vision? N N  Difficulty concentrating or making decisions? N N  Walking or climbing stairs? Y N  Dressing or bathing? N N  Doing errands, shopping? Tempie Donning  Preparing Food and eating ? N N  Using the Toilet? N N  In the past six months, have you accidently leaked urine? Y N  Do you have problems with loss of bowel control? N N  Managing your Medications? N N  Managing your Finances? N N  Housekeeping or managing your Housekeeping? N Y    Fall/Depression Screening:    PHQ 2/9 Scores 02/24/2015 02/01/2015 12/29/2014 11/25/2014 10/22/2014  PHQ - 2 Score 0 0 0 0 0    Assessment:  Ongoing case management related to HTN Ongoing follow up related to bilateral edema Follow up on dietary measures related to healthy eating habits.  Plan:  Physical assessment completed with no acute issues noted. Verified pt continues to document all  BP readings in her calendar tool with good readings. Verified pt continue to have no symptoms of hypo-hypertension signs or symptoms and takes all her medications as prescribed with no problems. Verified pt has provided all BP readings to her providers who continue to give her a good report concerning her examinations since her recent chemotherapy treatment. Verified pt's attendance to all her medical appointments with no delays.  RN will continue to reiterated on the signs and symptoms as requested and teach-back method to increase pt's knowledge base related her medical condition.  Will strongly encouraged pt to wear her compression stockings as needed and elevate her bilateral legs to reduce ongoing swelling. Also discussed  other compression stocking complains ([employer] in [place], [place]) where a warehouse exist for different types of compression for her medical condition. Will continue to encouraged adherence with use of her compression stockings. Note pt has thigh highs provided via [employer] on previous visit and offered knee high if needed however compression (surgery type compression).  Will discuss pt's dietary habits and eating habits over the last month concerning healthy eating habits. Will continue to encourage healthy eating habits with low sodium dietary measures.  Will discuss plan of care and reviewed and set goals alone with interventions to assist. No other inquires or request at this time. Plan to visit in one month with ongoing case management services.   Raina Mina, RN Care Management Coordinator [facility] Main Office [phone]

## 2015-03-27 ENCOUNTER — Other Ambulatory Visit: Payer: Self-pay | Admitting: Oncology

## 2015-03-30 ENCOUNTER — Ambulatory Visit (INDEPENDENT_AMBULATORY_CARE_PROVIDER_SITE_OTHER): Payer: Commercial Managed Care - HMO | Admitting: Podiatry

## 2015-03-30 ENCOUNTER — Encounter: Payer: Self-pay | Admitting: Podiatry

## 2015-03-30 VITALS — BP 138/69 | HR 82 | Resp 12

## 2015-03-30 DIAGNOSIS — G620 Drug-induced polyneuropathy: Secondary | ICD-10-CM

## 2015-03-30 DIAGNOSIS — M79673 Pain in unspecified foot: Secondary | ICD-10-CM | POA: Diagnosis not present

## 2015-03-30 DIAGNOSIS — B351 Tinea unguium: Secondary | ICD-10-CM | POA: Diagnosis not present

## 2015-03-30 DIAGNOSIS — T451X5A Adverse effect of antineoplastic and immunosuppressive drugs, initial encounter: Secondary | ICD-10-CM

## 2015-03-30 DIAGNOSIS — G622 Polyneuropathy due to other toxic agents: Secondary | ICD-10-CM | POA: Diagnosis not present

## 2015-03-30 NOTE — Patient Instructions (Signed)
Today your diabetic foot examination reveals adequate circulation without any deformity in your feet The decreased feeling in your feet (peripheral neuropathy) is most likely associated with chemotherapy The toenails are thick brittle because of fungal toenail infection and we will trim these every 3 months to prevent excessive thickening  Diabetes and Foot Care Diabetes may cause you to have problems because of poor blood supply (circulation) to your feet and legs. This may cause the skin on your feet to become thinner, break easier, and heal more slowly. Your skin may become dry, and the skin may peel and crack. You may also have nerve damage in your legs and feet causing decreased feeling in them. You may not notice minor injuries to your feet that could lead to infections or more serious problems. Taking care of your feet is one of the most important things you can do for yourself.  HOME CARE INSTRUCTIONS  Wear shoes at all times, even in the house. Do not go barefoot. Bare feet are easily injured.  Check your feet daily for blisters, cuts, and redness. If you cannot see the bottom of your feet, use a mirror or ask someone for help.  Wash your feet with warm water (do not use hot water) and mild soap. Then pat your feet and the areas between your toes until they are completely dry. Do not soak your feet as this can dry your skin.  Apply a moisturizing lotion or petroleum jelly (that does not contain alcohol and is unscented) to the skin on your feet and to dry, brittle toenails. Do not apply lotion between your toes.  Trim your toenails straight across. Do not dig under them or around the cuticle. File the edges of your nails with an emery board or nail file.  Do not cut corns or calluses or try to remove them with medicine.  Wear clean socks or stockings every day. Make sure they are not too tight. Do not wear knee-high stockings since they may decrease blood flow to your legs.  Wear shoes  that fit properly and have enough cushioning. To break in new shoes, wear them for just a few hours a day. This prevents you from injuring your feet. Always look in your shoes before you put them on to be sure there are no objects inside.  Do not cross your legs. This may decrease the blood flow to your feet.  If you find a minor scrape, cut, or break in the skin on your feet, keep it and the skin around it clean and dry. These areas may be cleansed with mild soap and water. Do not cleanse the area with peroxide, alcohol, or iodine.  When you remove an adhesive bandage, be sure not to damage the skin around it.  If you have a wound, look at it several times a day to make sure it is healing.  Do not use heating pads or hot water bottles. They may burn your skin. If you have lost feeling in your feet or legs, you may not know it is happening until it is too late.  Make sure your health care provider performs a complete foot exam at least annually or more often if you have foot problems. Report any cuts, sores, or bruises to your health care provider immediately. SEEK MEDICAL CARE IF:   You have an injury that is not healing.  You have cuts or breaks in the skin.  You have an ingrown nail.  You notice redness  on your legs or feet.  You feel burning or tingling in your legs or feet.  You have pain or cramps in your legs and feet.  Your legs or feet are numb.  Your feet always feel cold. SEEK IMMEDIATE MEDICAL CARE IF:   There is increasing redness, swelling, or pain in or around a wound.  There is a red line that goes up your leg.  Pus is coming from a wound.  You develop a fever or as directed by your health care provider.  You notice a bad smell coming from an ulcer or wound. Document Released: 07/06/2000 Document Revised: 03/11/2013 Document Reviewed: 12/16/2012 [facility] Patient Information 2015 [place], [place]. This information is not intended to replace advice given to you  by your health care provider. Make sure you discuss any questions you have with your health care provider.

## 2015-03-30 NOTE — Progress Notes (Signed)
   Subjective:    Patient ID: Katelyn Fisher, female    DOB: 1951/02/28, 64 y.o.   MRN: [id number]  HPI  This patient presents today complaining of approximately 3 years of gradually thickening toenails which are uncomfortable with direct shoe pressure. Patient describes attempting to trim toenails, however, she is not able to reduce the pressure or trim them adequately. She also relates a history of chemotherapy that has resulted in some neuropathy in her feet. Patient denies any history of foot ulceration, claudication, or amputation  Review of Systems  HENT: Positive for sinus pressure.   Cardiovascular: Positive for leg swelling.  Musculoskeletal: Positive for myalgias, back pain, joint swelling and gait problem.       Objective:   Physical Exam  Orientated 3  Vascular: DP and PT pulses 2/4 bilaterally Capillary reflex immediate bilaterally Ankle reflex equal and reactive bilaterally  Neurological: Sensation to 10 g monofilament wire intact 2/5 right and 0/5 left Vibratory sensation nonreactive bilaterally Ankle reflexes reactive bilaterally  Dermatological: No open skin lesions noted bilaterally The toenails are elongated, brittle, discolored, incurvated 6-10  Musculoskeletal: No deformities noted bilaterally There is no restriction ankle, subtalar, midtarsal joints bilaterally      Assessment & Plan:   Assessment: Satisfactory vascular status 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 The toenails 10 were debrided mechanically and electrically without any bleeding  Reappoint 3 months

## 2015-03-31 ENCOUNTER — Encounter: Payer: Self-pay | Admitting: Oncology

## 2015-03-31 ENCOUNTER — Telehealth: Payer: Self-pay | Admitting: Oncology

## 2015-03-31 ENCOUNTER — Ambulatory Visit (HOSPITAL_BASED_OUTPATIENT_CLINIC_OR_DEPARTMENT_OTHER): Payer: Commercial Managed Care - HMO

## 2015-03-31 ENCOUNTER — Other Ambulatory Visit (HOSPITAL_BASED_OUTPATIENT_CLINIC_OR_DEPARTMENT_OTHER): Payer: Commercial Managed Care - HMO

## 2015-03-31 ENCOUNTER — Ambulatory Visit (HOSPITAL_BASED_OUTPATIENT_CLINIC_OR_DEPARTMENT_OTHER): Payer: Commercial Managed Care - HMO | Admitting: Oncology

## 2015-03-31 VITALS — BP 147/84 | HR 76 | Temp 97.8°F | Resp 18 | Ht 62.0 in | Wt 237.4 lb

## 2015-03-31 DIAGNOSIS — G622 Polyneuropathy due to other toxic agents: Secondary | ICD-10-CM | POA: Diagnosis not present

## 2015-03-31 DIAGNOSIS — C541 Malignant neoplasm of endometrium: Secondary | ICD-10-CM

## 2015-03-31 DIAGNOSIS — D6481 Anemia due to antineoplastic chemotherapy: Secondary | ICD-10-CM | POA: Diagnosis not present

## 2015-03-31 DIAGNOSIS — Z95828 Presence of other vascular implants and grafts: Secondary | ICD-10-CM

## 2015-03-31 DIAGNOSIS — T451X5A Adverse effect of antineoplastic and immunosuppressive drugs, initial encounter: Secondary | ICD-10-CM

## 2015-03-31 DIAGNOSIS — G620 Drug-induced polyneuropathy: Secondary | ICD-10-CM

## 2015-03-31 LAB — CBC WITH DIFFERENTIAL/PLATELET
BASO%: 0.6 % (ref 0.0–2.0)
BASOS ABS: 0 10*3/uL (ref 0.0–0.1)
EOS ABS: 0.1 10*3/uL (ref 0.0–0.5)
EOS%: 1.8 % (ref 0.0–7.0)
HEMATOCRIT: 37.2 % (ref 34.8–46.6)
HEMOGLOBIN: 11.6 g/dL (ref 11.6–15.9)
LYMPH#: 1.4 10*3/uL (ref 0.9–3.3)
LYMPH%: 36.9 % (ref 14.0–49.7)
MCH: 21.7 pg — ABNORMAL LOW (ref 25.1–34.0)
MCHC: 31.2 g/dL — ABNORMAL LOW (ref 31.5–36.0)
MCV: 69.5 fL — AB (ref 79.5–101.0)
MONO#: 0.5 10*3/uL (ref 0.1–0.9)
MONO%: 13.2 % (ref 0.0–14.0)
NEUT#: 1.8 10*3/uL (ref 1.5–6.5)
NEUT%: 47.5 % (ref 38.4–76.8)
Platelets: 198 10*3/uL (ref 145–400)
RBC: 5.34 10*6/uL (ref 3.70–5.45)
RDW: 16 % — AB (ref 11.2–14.5)
WBC: 3.8 10*3/uL — ABNORMAL LOW (ref 3.9–10.3)

## 2015-03-31 LAB — COMPREHENSIVE METABOLIC PANEL (CC13)
ALBUMIN: 3.5 g/dL (ref 3.5–5.0)
ALK PHOS: 77 U/L (ref 40–150)
ALT: 25 U/L (ref 0–55)
AST: 22 U/L (ref 5–34)
Anion Gap: 9 mEq/L (ref 3–11)
BILIRUBIN TOTAL: 0.4 mg/dL (ref 0.20–1.20)
BUN: 12.6 mg/dL (ref 7.0–26.0)
CO2: 25 mEq/L (ref 22–29)
Calcium: 9.2 mg/dL (ref 8.4–10.4)
Chloride: 108 mEq/L (ref 98–109)
Creatinine: 0.8 mg/dL (ref 0.6–1.1)
EGFR: >90 mL/min/{1.73_m2} (ref >90)
Glucose: 98 mg/dl (ref 70–140)
Potassium: 3.4 mEq/L — ABNORMAL LOW (ref 3.5–5.1)
Sodium: 143 mEq/L (ref 136–145)
Total Protein: 6.7 g/dL (ref 6.4–8.3)

## 2015-03-31 MED ORDER — HEPARIN SOD (PORK) LOCK FLUSH 100 UNIT/ML IV SOLN
500.0000 [IU] | Freq: Once | INTRAVENOUS | Status: AC
  Administered 2015-03-31: 500 [IU] via INTRAVENOUS
  Filled 2015-03-31: qty 5

## 2015-03-31 MED ORDER — SODIUM CHLORIDE 0.9 % IJ SOLN
10.0000 mL | INTRAMUSCULAR | Status: DC | PRN
  Administered 2015-03-31: 10 mL via INTRAVENOUS
  Filled 2015-03-31: qty 10

## 2015-03-31 NOTE — Progress Notes (Signed)
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 Last imaging was CT AP 03-03-15 with no evidence of disease. She saw Dr Skeet Latch on 03-03-15 and will see her again on 07-07-15.   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 She has pain right hip/knee/ leg evaluated with MRI by Dr Nelva Bush, to see him with discussion of results tomorrow (that scan not in this EMR). She denies abdominal or pelvic pain. She has no bleeding.  PAC in. Peripheral IV access extremely difficult, so will leave in for now. Genetics testing normal 11-2014 (comprehensive cancer panel by [facility])  ONCOLOGIC HISTORY Patient presented to Dr Dellis Filbert with new onset postmenopausal spotting. [place] 05-07-14 reportedly had thickened endometrial stripe, with 3 fibroids largest 5x 5x 3.5 cm; endometrial biopsy 05-07-14 reportedly was concerning for at least FIGO grade 1 endometrial carcinoma. She had consultation with Dr Denman George on 05-20-14. Surgery was robotic hysterectomy, BSO, bilateral pelvic and para-aortic nodes by Dr Skeet Latch at [facility] on 06-23-14. Patient did well with surgery, discharged home on POD #1. 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 PET/ CT at [facility] 07-30-14 had no findings of concern for distant disease. [facility]  multidisciplinary conference recommended 6 cycles of taxane platinum chemotherapy followed by vaginal brachytherapy, and for genetics counseling.  Cycle 1 carboplatin taxol was given 2-08-24-14, with neutropenia day 10, granix added. Initial severe nausea controlled with addition of EMEND and aches controlled with claritin. She completed 6 cycles of carboplatin taxol on 12-06-14, with granix support. HDR x 5 completed 01-03-15 with dose 30 Gy.     Review of systems as above, also: No fever or symptoms of infection. No problems with PAC. No respiratory symptoms. Remainder of 10 point Review of Systems negative.  Objective:  Vital signs in last 24 hours:  BP 147/84 mmHg  Pulse 76  Temp(Src) 97.8 F (36.6 C) (Oral)  Resp 18  Ht _0  (1.575 m)  Wt 237 lb 6.4 oz (107.684 kg)  BMI 43.41 kg/m2  weight up 2 lbs Alert, oriented and appropriate. Ambulatory slowly.  No alopecia  HEENT:PERRL, sclerae not icteric. Oral mucosa moist without lesions, posterior pharynx clear.  Neck supple. No JVD.  Lymphatics:no cervical,supraclavicular adenopathy Resp: clear to auscultation bilaterally and normal percussion bilaterally Cardio: regular rate and rhythm. No gallop. GI: abdomen obese, soft, nontender, not distended, no mass or organomegaly. Normally active bowel sounds. Surgical incisions not remarkable. Musculoskeletal/ Extremities: without pitting edema, cords, tenderness Neuro: peripheral neuropathy feet unchanged. Otherwise nonfocal. PSYCH appropriate mood and affect Skin without rash, ecchymosis, petechiae Portacath-without erythema or tenderness  Lab Results:  Results for orders placed or performed in visit on 03/31/15  CBC with Differential  Result Value Ref Range   WBC 3.8 (L) 3.9 - 10.3 10e3/uL   NEUT# 1.8 1.5 - 6.5 10e3/uL   HGB 11.6 11.6 - 15.9 g/dL   HCT 37.2 34.8 -  46.6 %   Platelets 198 145 - 400 10e3/uL   MCV 69.5 (L) 79.5 - 101.0 fL   MCH 21.7 (L) 25.1 - 34.0 pg   MCHC  31.2 (L) 31.5 - 36.0 g/dL   RBC 5.34 3.70 - 5.45 10e6/uL   RDW 16.0 (H) 11.2 - 14.5 %   lymph# 1.4 0.9 - 3.3 10e3/uL   MONO# 0.5 0.1 - 0.9 10e3/uL   Eosinophils Absolute 0.1 0.0 - 0.5 10e3/uL   Basophils Absolute 0.0 0.0 - 0.1 10e3/uL   NEUT% 47.5 38.4 - 76.8 %   LYMPH% 36.9 14.0 - 49.7 %   MONO% 13.2 0.0 - 14.0 %   EOS% 1.8 0.0 - 7.0 %   BASO% 0.6 0.0 - 2.0 %  Comprehensive metabolic panel (Cmet) - [facility]  Result Value Ref Range   Sodium 143 136 - 145 mEq/L   Potassium 3.4 (L) 3.5 - 5.1 mEq/L   Chloride 108 98 - 109 mEq/L   CO2 25 22 - 29 mEq/L   Glucose 98 70 - 140 mg/dl   BUN 12.6 7.0 - 26.0 mg/dL   Creatinine 0.8 0.6 - 1.1 mg/dL   Total Bilirubin 0.40 0.20 - 1.20 mg/dL   Alkaline Phosphatase 77 40 - 150 U/L   AST 22 5 - 34 U/L   ALT 25 0 - 55 U/L   Total Protein 6.7 6.4 - 8.3 g/dL   Albumin 3.5 3.5 - 5.0 g/dL   Calcium 9.2 8.4 - 10.4 mg/dL   Anion Gap 9 3 - 11 mEq/L   EGFR >90 >90 ml/min/1.73 m2     Studies/Results:  EXAM: CT ABDOMEN AND PELVIS WITH CONTRAST  03-03-15  CONTRAST: 155m OMNIPAQUE IOHEXOL 300 MG/ML SOLN, 562mOMNIPAQUE IOHEXOL 300 MG/ML SOLN. The patient was premedicated according to the 13hour steroid and Benadryl prep due to a history of iodinated contrast allergy. There were no immediate complications.  COMPARISON: Abdominal pelvic CT 12/13/2009.  FINDINGS: Lower chest: Clear lung bases. No significant pleural or pericardial effusion. Small hiatal hernia.  Hepatobiliary: The liver is normal in density without focal abnormality. No biliary dilatation status post cholecystectomy.  Pancreas: Stable appearance. Mild prominence of the pancreatic tail on image number 19 is unchanged. There is no pancreatic ductal dilatation or surrounding inflammation.  Spleen: Normal in size without focal abnormality.  Adrenals/Urinary Tract: Both adrenal glands appear normal. Small low-density renal lesions bilaterally are not well seen on  previous noncontrast study and mostly too small to optimally characterize. 10 mm lesion in the interpolar region of the right kidney measures water density consistent with a cyst. No suspicious renal findings. No hydronephrosis or evidence of urinary tract calculus. The bladder appears unremarkable.  Stomach/Bowel: The stomach, small bowel and proximal colon demonstrate no significant findings. There are diverticular changes extending from the splenic flexure into the sigmoid colon. The colon is decompressed at the splenic flexure and in the mid sigmoid without apparent focal lesion. There is possible rectal wall thickening, nonspecific in light of previous pelvic radiation.  Vascular/Lymphatic: There are no enlarged abdominal or pelvic lymph nodes. Small retroperitoneal lymph nodes are not pathologically enlarged. There are no significant vascular findings.  Reproductive: Status post hysterectomy. The vaginal cuff appears unremarkable. No evidence of adnexal mass.  Other: Small supraumbilical hernia containing only fat, likely at laparoscopy port.  Musculoskeletal: No acute or significant osseous findings. Degenerative disc disease and grade 1 anterolisthesis noted at L5-S1.  IMPRESSION: 1. Interval hysterectomy. No evidence of local recurrence or metastatic  disease. 2. No acute abdominal pelvic findings. 3. Distal colonic diverticulosis with possible rectal wall thickening, possibly related to prior radiation therapy. Correlate Clinically.  Medications: I have reviewed the patient's current medications.  DISCUSSION : clinically doing well other than peripheral neuropathy now 3.5 months from completion of adjuvant chemotherapy, including gradual improvement in blood counts. She agrees entirely with keeping PAC for now, understands that this needs flush every 6-8 weeks when not otherwise used.   She needs flu vaccine this fall, which can be done with next PAC flush if  not prior.  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 Genetics testing no abnormalities identified. She will keep appointments with Drs Skeet Latch and Isidore Moos; I will see her coordinating with PAC flush after first of year. 2.chemo anemia: improving, continue oral iron. Follow up labs coordinating with PAC flush. 3.PAC in: flush every 6-8 weeks when not otherwise used. Peripheral IV access extremely difficult, so leave this for now 4.arthritis LE known to Dr Nelva Bush 5.obesity, BMI 43.5. She enjoys walking and using FitBit, encouraged. Would be best from cancer and orthopedic standpoint to lose weight to ideal 6.asthma intermittent: no symptoms now, continuing usual medications 7.past colon polyps, hx diverticulitis, GERD 8.chemo peripheral neuropathy fingers and feet: may be chemo + orthopedic 9.post cholecystectomy 10.hx benign positional vertigo. HTN followed by PCP  All questions answered. She will discuss jury duty excuse with Dr Nelva Bush, as her concerns for jury duty are orthopedic related. Time spent 20 min including >50% counseling and coordination of care    LIVESAY,LENNIS P, MD   03/31/2015, 10:36 AM

## 2015-03-31 NOTE — Telephone Encounter (Signed)
appointments made and avs printed for patient and pof printed for dr Marko Plume for next year

## 2015-03-31 NOTE — Patient Instructions (Signed)

## 2015-04-08 ENCOUNTER — Ambulatory Visit: Payer: Commercial Managed Care - HMO | Admitting: *Deleted

## 2015-04-14 ENCOUNTER — Other Ambulatory Visit: Payer: Self-pay | Admitting: *Deleted

## 2015-04-14 NOTE — Patient Outreach (Signed)
[facility]) Care Management   04/14/2015  PHILICIA HEYNE 1951-01-09 [id number]  Katelyn Fisher is an 64 y.o. female  Subjective:  HTN: Pt reports her continues to monitoring her HTN with documented readings with normal ranges. Pt aware of the signs however continues to need further education. Pt will follow up with her provider within the next month. Denies any elevated BP as pt continues to take her BP medications as recommended.  NUTRITIONAL:  Pt discuss her dietary habits and food items consumed.  Pt also consume protein shakes to assist with her ongoing weight loss and nutritional needs. Pt states she will continue to eat health and attempt to lose additional weight.  EDEMA: Pt reports ongoing swelling and indicates she needs a small size in new compression stockings and requested assistance from this RN [facility]). States she continues to wear the ones she has however to loose in the knee area.  Pt reorots she removes her TEDs at night prior to bedtime and continues to elevate her legs when needed.  PAIN: Pt reports ongoing back pain and leg pain that she reports receiving recent pain medications that has "helped a lot". Pt also reports she is receiving cortisone shots when needed to reduce ongoing pain to her lower back.  Objective:   Review of Systems  All other systems reviewed and are negative.   Physical Exam  Constitutional: She is oriented to person, place, and time. She appears well-developed and well-nourished.  HENT:  Right Ear: External ear normal.  Left Ear: External ear normal.  Eyes: EOM are normal.  Neck: Normal range of motion.  Cardiovascular: Regular rhythm.   Respiratory: Effort normal and breath sounds normal.  GI: Soft. Bowel sounds are normal.  Musculoskeletal: Normal range of motion.  Neurological: She is alert and oriented to person, place, and time.  Skin: Skin is warm and dry.  Psychiatric: She has a normal mood and affect. Her behavior  is normal. Judgment and thought content normal.   Filed Vitals:   04/14/15 1613  BP: 116/68  Pulse: 62  Resp: 20   Current Medications:   Current Outpatient Prescriptions  Medication Sig Dispense Refill  . albuterol (PROAIR HFA) 108 (90 BASE) MCG/ACT inhaler Inhale 2 puffs into the lungs 2 (two) times daily as needed. 3 Inhaler 2  . albuterol (PROVENTIL) (2.5 MG/3ML) 0.083% nebulizer solution Take 3 mLs (2.5 mg total) by nebulization every 6 (six) hours as needed for wheezing or shortness of breath. 150 mL 1  . amLODipine (NORVASC) 10 MG tablet Take 1 tablet (10 mg total) by mouth daily. 90 tablet 2  . aspirin 81 MG tablet Take 81 mg by mouth daily.      Marland Kitchen atorvastatin (LIPITOR) 20 MG tablet Take 10 mg by mouth daily.    . benazepril (LOTENSIN) 40 MG tablet Take 1 tablet (40 mg total) by mouth daily. 90 tablet 2  . budesonide-formoterol (SYMBICORT) 160-4.5 MCG/ACT inhaler Inhale 2 puffs into the lungs 2 (two) times daily. 3 Inhaler 2  . cyclobenzaprine (FLEXERIL) 10 MG tablet Take 1 tablet (10 mg total) by mouth 2 (two) times daily as needed for muscle spasms. 20 tablet 0  . diclofenac sodium (VOLTAREN) 1 % GEL Apply 2 g topically 4 (four) times daily as needed.     . diphenhydramine-acetaminophen (TYLENOL PM) 25-500 MG TABS Take 1 tablet by mouth at bedtime as needed.    . ferrous fumarate (HEMOCYTE) 325 (106 FE) MG TABS tablet Take 1  tablet daily on an empty stomach with OJ. 30 each 4  . fluticasone (FLONASE) 50 MCG/ACT nasal spray Place 2 sprays into the nose daily. 16 g 0  . furosemide (LASIX) 20 MG tablet Take 1 tablet (20 mg total) by mouth daily. 90 tablet 0  . HYDROcodone-acetaminophen (NORCO) 10-325 MG per tablet Take 1 tablet by mouth 2 (two) times daily as needed.  0  . HYDROcodone-homatropine (HYCODAN) 5-1.5 MG/5ML syrup Take 5 mLs by mouth every 8 (eight) hours as needed for cough. 120 mL 0  . levothyroxine (SYNTHROID, LEVOTHROID) 50 MCG tablet 1 PILL DAILY FOR 5 DAYS A WEEK  AND 2 PILLS A DAY ON Wednesday AND Saturday 105 tablet 1  . meloxicam (MOBIC) 15 MG tablet Take 15 mg by mouth daily as needed.     . Multiple Vitamin (MULTIVITAMIN) tablet Take 1 tablet by mouth daily.    . nebivolol (BYSTOLIC) 10 MG tablet Take 1 tablet (10 mg total) by mouth 2 (two) times daily. 180 tablet 2  . pantoprazole (PROTONIX) 40 MG tablet Take 1 tablet (40 mg total) by mouth daily. 90 tablet 2  . pentoxifylline (TRENTAL) 400 MG CR tablet Take 400 mg by mouth 2 (two) times daily.     . Probiotic Product (ALIGN) 4 MG CAPS Take 1 capsule by mouth daily.     . sennosides-docusate sodium (SENOKOT-S) 8.6-50 MG tablet Take 1 tablet by mouth daily as needed for constipation.    . triamcinolone (KENALOG) 0.1 % cream Apply 1 application topically. Apply topically to affected area once daily     . gabapentin (NEURONTIN) 300 MG capsule Take 1 capsule (300 mg total) by mouth 3 (three) times daily. 20 capsule 0  . HYDROcodone-acetaminophen (NORCO/VICODIN) 5-325 MG per tablet Take 1-2 tablets by mouth every 6 (six) hours as needed. (Patient not taking: Reported on 04/14/2015) 15 tablet 0  . traMADol (ULTRAM) 50 MG tablet Take 1-2 tablets by mouth as needed every 8 hours for pain. (Patient not taking: Reported on 03/31/2015) 30 tablet 0   No current facility-administered medications for this visit.    Functional Status:   In your present state of health, do you have any difficulty performing the following activities: 02/24/2015 11/25/2014  Hearing? N N  Vision? N N  Difficulty concentrating or making decisions? N N  Walking or climbing stairs? Y N  Dressing or bathing? N N  Doing errands, shopping? Tempie Donning  Preparing Food and eating ? N N  Using the Toilet? N N  In the past six months, have you accidently leaked urine? Y N  Do you have problems with loss of bowel control? N N  Managing your Medications? N N  Managing your Finances? N N  Housekeeping or managing your Housekeeping? N Y     Fall/Depression Screening:    PHQ 2/9 Scores 02/24/2015 02/01/2015 12/29/2014 11/25/2014 10/22/2014  PHQ - 2 Score 0 0 0 0 0    Assessment:   Ongoing case management related to HTN Nutritional issues related to healthy eating habits. Bilateral edema related to lower legs Pain management related to back and leg area  Plan:  Physical assessment completed with no acute issues noted today however ongoing issues related to pt's plan of care addressed. Will discuss ongoing HTN education and reiterate on signs and symptoms and what to do if acute issues are encountered.  Pt able to mention some teach back method on HTN however continues to need ongoing teaching on what to do  if acute symptoms are encountered.  Will continue to praise pt for monitoring her BP daily and documenting all readings for providers to view. Will continue to encouraged adherence with daily monitriing and administering all her prescribed medications.  Will discussed pt's eating habits and continue to encouraged adherence with healthy eating. Will also discuss portion sizes and increase her exercises as tolerated. Strongly encouraged pt to consume health food items in additional to her protein shakes.  Will measure pt for additional compression stockings and continue to encouraged pt to wear the current compression stockings until RN able to supply additional compressions stockings smaller in size to reduce ongoing swelling. Will continue to encourage pt to elevate her bilateral legs to reduce swelling.  Size for calf 18 1/2 and length 17 1/2. Will order and provide next home visit.  Plan of care and goals evaluate and adjusted accordingly. RN will re-evaluate on next home visit. Will schedule next month's home visit as allowed.  Raina Mina, RN Care Management Coordinator [facility] Main [facility] [phone]

## 2015-05-10 ENCOUNTER — Other Ambulatory Visit: Payer: Self-pay | Admitting: *Deleted

## 2015-05-10 NOTE — Patient Outreach (Signed)
[facility]) Care Management  05/10/2015  Katelyn Fisher 06-19-1951 [id number]  RN received a call from pt today to verify next home visit day and time via voice message. RN returned the call and left information concerning the date and time of the next scheduled home visit for this week.  Raina Mina, RN Care Management Coordinator [facility] Main [facility] [phone]

## 2015-05-13 ENCOUNTER — Ambulatory Visit: Payer: Commercial Managed Care - HMO | Admitting: *Deleted

## 2015-05-17 ENCOUNTER — Encounter: Payer: Self-pay | Admitting: Oncology

## 2015-05-17 ENCOUNTER — Encounter: Payer: Self-pay | Admitting: *Deleted

## 2015-05-17 ENCOUNTER — Encounter: Payer: Self-pay | Admitting: Family Medicine

## 2015-05-17 ENCOUNTER — Other Ambulatory Visit: Payer: Self-pay | Admitting: *Deleted

## 2015-05-17 ENCOUNTER — Ambulatory Visit: Payer: Commercial Managed Care - HMO | Admitting: *Deleted

## 2015-05-17 NOTE — Patient Outreach (Signed)
[facility]) Care Management   05/17/2015  GERYL DOHN 05-Aug-1950 [id number]  Katelyn Fisher is an 64 y.o. female  Subjective:   HTN: Pt states she continues to document her daily BP and states she only elevates on the day she eats "pork chops" but only for that day. Pt states its back to normal the following day and she is aware of the signs and symptoms of HTN and what to do if acute issues occur. Pt states she feels more comfortable being aware of what to do if she has an emergency. Pt continues to states she eats healthy with more fruits and vegetables along with protein drinks and nutritional supplements when needed. Pt very pleased wit the [facility] services and appreciative for the printable information in the past and monthly home visit which she feels has helped her the most.  SWELLING: Pt reports she has a history of swelling and usually has to wear compression stockings daily. Pt has requested new compression stockings due to more constriction needed from her previous ones with not a lot of elastic compressions. Pt also states she elevated her lower legs as needed with reduced swelling.  No other request or inquires at this time as pt receptive to today's discharged with all goals met and has no questions related. Pt verified she remains with the same primary provider for discharge note.  Objective:   Review of Systems  All other systems reviewed and are negative.   Physical Exam  Constitutional: She is oriented to person, place, and time. She appears well-developed and well-nourished.  Neck: Normal range of motion.  Cardiovascular: Regular rhythm.   Respiratory: Effort normal and breath sounds normal.  GI: Soft. Bowel sounds are normal.  Musculoskeletal: Normal range of motion. She exhibits edema.  Neurological: She is alert and oriented to person, place, and time.  Skin: Skin is warm and dry.  Psychiatric: She has a normal mood and affect. Her behavior is  normal. Judgment and thought content normal.    Current Medications:   Current Outpatient Prescriptions  Medication Sig Dispense Refill  . albuterol (PROAIR HFA) 108 (90 BASE) MCG/ACT inhaler Inhale 2 puffs into the lungs 2 (two) times daily as needed. 3 Inhaler 2  . albuterol (PROVENTIL) (2.5 MG/3ML) 0.083% nebulizer solution Take 3 mLs (2.5 mg total) by nebulization every 6 (six) hours as needed for wheezing or shortness of breath. 150 mL 1  . amLODipine (NORVASC) 10 MG tablet Take 1 tablet (10 mg total) by mouth daily. 90 tablet 2  . aspirin 81 MG tablet Take 81 mg by mouth daily.      Marland Kitchen atorvastatin (LIPITOR) 20 MG tablet Take 10 mg by mouth daily.    . benazepril (LOTENSIN) 40 MG tablet Take 1 tablet (40 mg total) by mouth daily. 90 tablet 2  . budesonide-formoterol (SYMBICORT) 160-4.5 MCG/ACT inhaler Inhale 2 puffs into the lungs 2 (two) times daily. 3 Inhaler 2  . cyclobenzaprine (FLEXERIL) 10 MG tablet Take 1 tablet (10 mg total) by mouth 2 (two) times daily as needed for muscle spasms. 20 tablet 0  . diclofenac sodium (VOLTAREN) 1 % GEL Apply 2 g topically 4 (four) times daily as needed.     . diphenhydramine-acetaminophen (TYLENOL PM) 25-500 MG TABS Take 1 tablet by mouth at bedtime as needed.    . ferrous fumarate (HEMOCYTE) 325 (106 FE) MG TABS tablet Take 1 tablet daily on an empty stomach with OJ. 30 each 4  . fluticasone (  FLONASE) 50 MCG/ACT nasal spray Place 2 sprays into the nose daily. 16 g 0  . furosemide (LASIX) 20 MG tablet Take 1 tablet (20 mg total) by mouth daily. 90 tablet 0  . gabapentin (NEURONTIN) 300 MG capsule Take 1 capsule (300 mg total) by mouth 3 (three) times daily. 20 capsule 0  . HYDROcodone-acetaminophen (NORCO) 10-325 MG per tablet Take 1 tablet by mouth 2 (two) times daily as needed.  0  . HYDROcodone-acetaminophen (NORCO/VICODIN) 5-325 MG per tablet Take 1-2 tablets by mouth every 6 (six) hours as needed. 15 tablet 0  . HYDROcodone-homatropine (HYCODAN)  5-1.5 MG/5ML syrup Take 5 mLs by mouth every 8 (eight) hours as needed for cough. 120 mL 0  . levothyroxine (SYNTHROID, LEVOTHROID) 50 MCG tablet 1 PILL DAILY FOR 5 DAYS A WEEK AND 2 PILLS A DAY ON Wednesday AND Saturday 105 tablet 1  . meloxicam (MOBIC) 15 MG tablet Take 15 mg by mouth daily as needed.     . Multiple Vitamin (MULTIVITAMIN) tablet Take 1 tablet by mouth daily.    . nebivolol (BYSTOLIC) 10 MG tablet Take 1 tablet (10 mg total) by mouth 2 (two) times daily. 180 tablet 2  . pantoprazole (PROTONIX) 40 MG tablet Take 1 tablet (40 mg total) by mouth daily. 90 tablet 2  . pentoxifylline (TRENTAL) 400 MG CR tablet Take 400 mg by mouth 2 (two) times daily.     . Probiotic Product (ALIGN) 4 MG CAPS Take 1 capsule by mouth daily.     . sennosides-docusate sodium (SENOKOT-S) 8.6-50 MG tablet Take 1 tablet by mouth daily as needed for constipation.    . traMADol (ULTRAM) 50 MG tablet Take 1-2 tablets by mouth as needed every 8 hours for pain. 30 tablet 0  . triamcinolone (KENALOG) 0.1 % cream Apply 1 application topically. Apply topically to affected area once daily      No current facility-administered medications for this visit.    Functional Status:   In your present state of health, do you have any difficulty performing the following activities: 05/17/2015 02/24/2015  Hearing? N N  Vision? N N  Difficulty concentrating or making decisions? N N  Walking or climbing stairs? N Y  Dressing or bathing? N N  Doing errands, shopping? N Y  Conservation officer, nature and eating ? N N  Using the Toilet? N N  In the past six months, have you accidently leaked urine? N Y  Do you have problems with loss of bowel control? N N  Managing your Medications? N N  Managing your Finances? N N  Housekeeping or managing your Housekeeping? N N    Fall/Depression Screening:    PHQ 2/9 Scores 05/17/2015 02/24/2015 02/01/2015 12/29/2014 11/25/2014 10/22/2014  PHQ - 2 Score 0 0 0 0 0 0   Filed Vitals:   05/17/15 1046   BP: 120/80  Pulse: 98  Resp: 20   Assessment:  Follow up on knowledge based related to HTN Bilateral edema related to compression stockings   Plan: Physical assessment completed with no acute symptoms noted today. All vitals within normal range and pt remains symptoms free and denies any signs encountered of HTN. Pt able to completed teach back method of learning concerning HTN symptoms that maybe encountered and what to do if acute symptoms should occur. Verified pt continues adherence with her ongoing medications and medical appointments with all her providers.  Will provide and apply new compression stockings to prevent any ongoing swelling to pt's lower legs.  Will verify pt is aware to remove the compression stockings prior to bedtime and wear throughout the day to prevent ongoing swelling. Will provide an extra pair of TEDs as requesting for ongoing adherence as her providers recommends.  RN offered to assist pt with the application of the new compression stockings. Measurements completed on previous visit and compression stockings applied with no problems and pt is comfortable with the fits and able to removal of the stockings.  Plan of care and goals met and discussed with pt for ongoing maintenance with daily management of care. No additional request or inquires as pt able to manager her care independent with no encountered problems or issues at this time. Will alert pt to contact [facility] if needed in the future concerning her ongoing medical needs. Will notify her primary provider of today's case closure via [facility] services and program.  Raina Mina, RN Care Management Coordinator [facility] [phone]

## 2015-05-17 NOTE — Patient Outreach (Signed)
[facility]) Care Management  05/17/2015  Katelyn Fisher 08/21/1950 [id number]   Notification from Raina Mina, RN to close case due to goals met with [facility].  Thanks, Ronnell Freshwater. Amorita, [place] Assistant Phone: [phone] Fax: [phone]

## 2015-05-18 ENCOUNTER — Other Ambulatory Visit: Payer: Self-pay | Admitting: Oncology

## 2015-05-18 ENCOUNTER — Other Ambulatory Visit: Payer: Self-pay | Admitting: *Deleted

## 2015-05-18 DIAGNOSIS — C541 Malignant neoplasm of endometrium: Secondary | ICD-10-CM

## 2015-05-18 MED ORDER — SODIUM CHLORIDE 0.9 % IJ SOLN
10.0000 mL | Freq: Once | INTRAMUSCULAR | Status: DC
  Filled 2015-05-18: qty 10

## 2015-05-18 MED ORDER — HEPARIN SOD (PORK) LOCK FLUSH 100 UNIT/ML IV SOLN
500.0000 [IU] | Freq: Once | INTRAVENOUS | Status: DC
Start: 2015-05-19 — End: 2015-05-18
  Filled 2015-05-18: qty 5

## 2015-05-19 ENCOUNTER — Ambulatory Visit (HOSPITAL_BASED_OUTPATIENT_CLINIC_OR_DEPARTMENT_OTHER): Payer: Commercial Managed Care - HMO

## 2015-05-19 VITALS — BP 124/89 | HR 78 | Temp 98.0°F | Resp 18

## 2015-05-19 DIAGNOSIS — C541 Malignant neoplasm of endometrium: Secondary | ICD-10-CM

## 2015-05-19 DIAGNOSIS — Z23 Encounter for immunization: Secondary | ICD-10-CM

## 2015-05-19 MED ORDER — INFLUENZA VAC SPLIT QUAD 0.5 ML IM SUSY
0.5000 mL | PREFILLED_SYRINGE | Freq: Once | INTRAMUSCULAR | Status: AC
  Administered 2015-05-19: 0.5 mL via INTRAMUSCULAR
  Filled 2015-05-19: qty 0.5

## 2015-05-19 MED ORDER — HEPARIN SOD (PORK) LOCK FLUSH 100 UNIT/ML IV SOLN
500.0000 [IU] | Freq: Once | INTRAVENOUS | Status: AC
  Administered 2015-05-19: 500 [IU]
  Filled 2015-05-19: qty 5

## 2015-05-19 MED ORDER — SODIUM CHLORIDE 0.9 % IJ SOLN
10.0000 mL | Freq: Once | INTRAMUSCULAR | Status: AC
  Administered 2015-05-19: 10 mL
  Filled 2015-05-19: qty 10

## 2015-05-20 ENCOUNTER — Other Ambulatory Visit: Payer: Self-pay | Admitting: Family Medicine

## 2015-05-20 DIAGNOSIS — E785 Hyperlipidemia, unspecified: Secondary | ICD-10-CM

## 2015-05-20 DIAGNOSIS — E079 Disorder of thyroid, unspecified: Secondary | ICD-10-CM

## 2015-05-20 DIAGNOSIS — I10 Essential (primary) hypertension: Secondary | ICD-10-CM

## 2015-05-20 NOTE — Telephone Encounter (Signed)
Labs entered to do before appt. On 05/30/15.  Per my chart request/PCP instructions.

## 2015-05-26 ENCOUNTER — Telehealth: Payer: Self-pay | Admitting: Family Medicine

## 2015-05-26 ENCOUNTER — Other Ambulatory Visit (INDEPENDENT_AMBULATORY_CARE_PROVIDER_SITE_OTHER): Payer: Commercial Managed Care - HMO

## 2015-05-26 DIAGNOSIS — E079 Disorder of thyroid, unspecified: Secondary | ICD-10-CM | POA: Diagnosis not present

## 2015-05-26 DIAGNOSIS — E785 Hyperlipidemia, unspecified: Secondary | ICD-10-CM | POA: Diagnosis not present

## 2015-05-26 DIAGNOSIS — I10 Essential (primary) hypertension: Secondary | ICD-10-CM

## 2015-05-26 LAB — COMPREHENSIVE METABOLIC PANEL
ALT: 20 U/L (ref 0–35)
AST: 19 U/L (ref 0–37)
Albumin: 3.8 g/dL (ref 3.5–5.2)
Alkaline Phosphatase: 82 U/L (ref 39–117)
BUN: 10 mg/dL (ref 6–23)
CHLORIDE: 102 meq/L (ref 96–112)
CO2: 31 mEq/L (ref 19–32)
CREATININE: 0.76 mg/dL (ref 0.40–1.20)
Calcium: 9.6 mg/dL (ref 8.4–10.5)
GFR: 98.72 mL/min (ref >60.00)
GLUCOSE: 105 mg/dL — AB (ref 70–99)
Potassium: 4.5 mEq/L (ref 3.5–5.1)
SODIUM: 140 meq/L (ref 135–145)
TOTAL PROTEIN: 7 g/dL (ref 6.0–8.3)
Total Bilirubin: 0.3 mg/dL (ref 0.2–1.2)

## 2015-05-26 LAB — LIPID PANEL
CHOL/HDL RATIO: 3
CHOLESTEROL: 133 mg/dL (ref 0–200)
HDL: 52.5 mg/dL (ref >39.00)
LDL Cholesterol: 58 mg/dL (ref 0–99)
NonHDL: 80.33
TRIGLYCERIDES: 111 mg/dL (ref 0.0–149.0)
VLDL: 22.2 mg/dL (ref 0.0–40.0)

## 2015-05-26 LAB — TSH: TSH: 4.84 u[IU]/mL — ABNORMAL HIGH (ref 0.35–4.50)

## 2015-05-26 NOTE — Telephone Encounter (Signed)
Caller name:Elma Relationship to patient: Can be reached:[phone] Pharmacy:  Reason for call:patient has an appointment with Dr Charlett Blake on Monday.  She would like to pick up her disability plaque form at that time

## 2015-05-26 NOTE — Telephone Encounter (Signed)
Form filled out as much as possible and forwarded to Dr. Charlett Blake. JG//CMA

## 2015-05-27 ENCOUNTER — Other Ambulatory Visit (INDEPENDENT_AMBULATORY_CARE_PROVIDER_SITE_OTHER): Payer: Commercial Managed Care - HMO

## 2015-05-27 ENCOUNTER — Other Ambulatory Visit: Payer: Self-pay | Admitting: Family Medicine

## 2015-05-27 DIAGNOSIS — E119 Type 2 diabetes mellitus without complications: Secondary | ICD-10-CM | POA: Diagnosis not present

## 2015-05-27 DIAGNOSIS — E785 Hyperlipidemia, unspecified: Secondary | ICD-10-CM

## 2015-05-27 DIAGNOSIS — I10 Essential (primary) hypertension: Secondary | ICD-10-CM | POA: Diagnosis not present

## 2015-05-27 LAB — MICROALBUMIN / CREATININE URINE RATIO
Creatinine,U: 116 mg/dL
MICROALB/CREAT RATIO: 0.6 mg/g (ref 0.0–30.0)
Microalb, Ur: <0.7 mg/dL (ref 0.0–1.9)

## 2015-05-27 LAB — HEMOGLOBIN A1C: Hgb A1c MFr Bld: 6.6 % — ABNORMAL HIGH (ref 4.6–6.5)

## 2015-05-27 MED ORDER — LEVOTHYROXINE SODIUM 75 MCG PO TABS: 75.0000 ug | ORAL_TABLET | Freq: Every day | ORAL | Status: DC

## 2015-05-30 ENCOUNTER — Ambulatory Visit (INDEPENDENT_AMBULATORY_CARE_PROVIDER_SITE_OTHER): Payer: Commercial Managed Care - HMO | Admitting: Family Medicine

## 2015-05-30 ENCOUNTER — Encounter: Payer: Self-pay | Admitting: Family Medicine

## 2015-05-30 VITALS — BP 138/78 | HR 87 | Temp 98.0°F | Ht 62.0 in | Wt 237.1 lb

## 2015-05-30 DIAGNOSIS — M545 Low back pain, unspecified: Secondary | ICD-10-CM

## 2015-05-30 DIAGNOSIS — E785 Hyperlipidemia, unspecified: Secondary | ICD-10-CM

## 2015-05-30 DIAGNOSIS — K219 Gastro-esophageal reflux disease without esophagitis: Secondary | ICD-10-CM

## 2015-05-30 DIAGNOSIS — E669 Obesity, unspecified: Secondary | ICD-10-CM | POA: Diagnosis not present

## 2015-05-30 DIAGNOSIS — C541 Malignant neoplasm of endometrium: Secondary | ICD-10-CM

## 2015-05-30 DIAGNOSIS — D649 Anemia, unspecified: Secondary | ICD-10-CM

## 2015-05-30 DIAGNOSIS — I10 Essential (primary) hypertension: Secondary | ICD-10-CM | POA: Diagnosis not present

## 2015-05-30 DIAGNOSIS — E118 Type 2 diabetes mellitus with unspecified complications: Secondary | ICD-10-CM

## 2015-05-30 DIAGNOSIS — E079 Disorder of thyroid, unspecified: Secondary | ICD-10-CM

## 2015-05-30 MED ORDER — CARVEDILOL 12.5 MG PO TABS: 12.5000 mg | ORAL_TABLET | Freq: Two times a day (BID) | ORAL | Status: DC

## 2015-05-30 NOTE — Assessment & Plan Note (Signed)
Has finished treatments, noted some neuropathy in right leg with pain and cold in right and left foot

## 2015-05-30 NOTE — Telephone Encounter (Signed)
Pt picked up form after appt today. Copy sent for scanning. JG//CMA

## 2015-05-30 NOTE — Assessment & Plan Note (Addendum)
Well controlled, no changes to meds. Encouraged heart healthy diet such as the DASH diet and exercise as tolerated. Will switch from Bystolic to Carvedilol due to cost concerns.

## 2015-05-30 NOTE — Assessment & Plan Note (Signed)
hgba1c acceptable, minimize simple carbs. Increase exercise as tolerated. Continue current meds. Up slightly

## 2015-05-30 NOTE — Progress Notes (Signed)
Pre visit review using our clinic review tool, if applicable. No additional management support is needed unless otherwise documented below in the visit note. 

## 2015-05-30 NOTE — Assessment & Plan Note (Signed)
Follows with Dr Nelva Bush and shots have been helpful

## 2015-05-30 NOTE — Patient Instructions (Signed)

## 2015-05-30 NOTE — Assessment & Plan Note (Signed)
Avoid offending foods, start probiotics. Do not eat large meals in late evening and consider raising head of bed.  

## 2015-05-30 NOTE — Assessment & Plan Note (Signed)
Encouraged DASH diet, decrease po intake and increase exercise as tolerated. Needs 7-8 hours of sleep nightly. Avoid trans fats, eat small, frequent meals every 4-5 hours with lean proteins, complex carbs and healthy fats. Minimize simple carbs, GMO foods. 

## 2015-05-30 NOTE — Assessment & Plan Note (Signed)
Tolerating statin, encouraged heart healthy diet, avoid trans fats, minimize simple carbs and saturated fats. Increase exercise as tolerated 

## 2015-06-05 NOTE — Progress Notes (Signed)
Subjective:    Patient ID: Katelyn Fisher, female    DOB: 1950-12-27, 64 y.o.   MRN: [id number]  Chief Complaint  Patient presents with  . Follow-up    HPI Patient is in today for follow-up. Is feeling well today. Denies any recent illness. Denies polyuria or polydipsia. No acute concerns noted today. Denies CP/palp/SOB/HA/congestion/fevers/GI or GU c/o. Taking meds as prescribed. Tries to maintain a heart healthy diet, minimizes simple carbs  Past Medical History  Diagnosis Date  . Asthma   . Hypertension   . Obesity   . Hx of colonic polyps   . GERD (gastroesophageal reflux disease)   . History of colonic diverticulitis   . Arthritis     back  . Hyperlipidemia   . Depression   . Low back pain radiating to right leg 08/18/2012  . Anxiety   . Benign paroxysmal positional vertigo 11/01/2012  . Thyroid disease 06/01/2013  . Cervical cancer screening 10/09/2010    Qualifier: Diagnosis of  By: Wynona Luna   . 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  . Preventative health care 12/09/2013  . Family history of breast cancer   . Family history of ovarian cancer   . Family history of prostate cancer   . Family history of colon cancer   . Radiation 12/17/14-01/03/15    vaginal brachytherapy  . Cancer [facility])     endometrial    Past Surgical History  Procedure Laterality Date  . Cholecystectomy  1991  . Dilation and curettage of uterus    . Tubal ligation    . Appendectomy    . Abdominal hysterectomy  06/23/14    [facility], TRH/BSO    Family History  Problem Relation Age of Onset  . Heart disease Maternal Aunt     x 3 -2 brothers  . Hypertension Maternal Aunt   . Breast cancer Maternal Aunt     maternal half; dx in her 55s  . Ovarian cancer Maternal Aunt     dx in her 37s  . Prostate cancer Father 22  . Diabetes Father   . Cancer Father     lung cancer, asbestos exposure  . Heart disease Brother   . Diabetes Brother   . Other      no FH colon cancer  . Diabetes Mother     . Lupus Mother   . Heart disease Brother   . Hyperlipidemia Sister   . Heart disease Brother   . Diabetes Brother   . Colon polyps Brother   . Heart disease Brother   . Diabetes Brother   . Colon polyps Brother   . Colon cancer Maternal Grandmother     dx in her 58s  . Leukemia Maternal Uncle 21  . Cancer Paternal Aunt     NOS  . Prostate cancer Paternal Uncle     dx in his 85s  . Breast cancer Other     dx in her 50s    Social History   Social History  . Marital Status: Married    Spouse Name: N/A  . Number of Children: N/A  . Years of Education: N/A   Occupational History  . Not on file.   Social History Main Topics  . Smoking status: Never Smoker   . Smokeless tobacco: Never Used  . Alcohol Use: No  . Drug Use: No  . Sexual Activity:    Partners: Male     Comment: lives with husband, no dietary restrictions   G2  P 2   Other Topics Concern  . Not on file   Social History Narrative   Married- 78 years   Never Smoked   Alcohol use-no   Drug use-no   Occupation: housewife   Caffeine use/day:  None   Does Patient Exercise:  yes    Outpatient Prescriptions Prior to Visit  Medication Sig Dispense Refill  . albuterol (PROAIR HFA) 108 (90 BASE) MCG/ACT inhaler Inhale 2 puffs into the lungs 2 (two) times daily as needed. 3 Inhaler 2  . albuterol (PROVENTIL) (2.5 MG/3ML) 0.083% nebulizer solution Take 3 mLs (2.5 mg total) by nebulization every 6 (six) hours as needed for wheezing or shortness of breath. 150 mL 1  . amLODipine (NORVASC) 10 MG tablet Take 1 tablet (10 mg total) by mouth daily. 90 tablet 2  . aspirin 81 MG tablet Take 81 mg by mouth daily.      Marland Kitchen atorvastatin (LIPITOR) 20 MG tablet Take 10 mg by mouth daily.    . benazepril (LOTENSIN) 40 MG tablet Take 1 tablet (40 mg total) by mouth daily. 90 tablet 2  . budesonide-formoterol (SYMBICORT) 160-4.5 MCG/ACT inhaler Inhale 2 puffs into the lungs 2 (two) times daily. 3 Inhaler 2  . cyclobenzaprine  (FLEXERIL) 10 MG tablet Take 1 tablet (10 mg total) by mouth 2 (two) times daily as needed for muscle spasms. 20 tablet 0  . diclofenac sodium (VOLTAREN) 1 % GEL Apply 2 g topically 4 (four) times daily as needed.     . diphenhydramine-acetaminophen (TYLENOL PM) 25-500 MG TABS Take 1 tablet by mouth at bedtime as needed.    . ferrous fumarate (HEMOCYTE) 325 (106 FE) MG TABS tablet Take 1 tablet daily on an empty stomach with OJ. 30 each 4  . fluticasone (FLONASE) 50 MCG/ACT nasal spray Place 2 sprays into the nose daily. 16 g 0  . furosemide (LASIX) 20 MG tablet Take 1 tablet (20 mg total) by mouth daily. 90 tablet 0  . gabapentin (NEURONTIN) 300 MG capsule Take 1 capsule (300 mg total) by mouth 3 (three) times daily. 20 capsule 0  . HYDROcodone-acetaminophen (NORCO) 10-325 MG per tablet Take 1 tablet by mouth 2 (two) times daily as needed.  0  . HYDROcodone-acetaminophen (NORCO/VICODIN) 5-325 MG per tablet Take 1-2 tablets by mouth every 6 (six) hours as needed. 15 tablet 0  . HYDROcodone-homatropine (HYCODAN) 5-1.5 MG/5ML syrup Take 5 mLs by mouth every 8 (eight) hours as needed for cough. 120 mL 0  . levothyroxine (SYNTHROID, LEVOTHROID) 75 MCG tablet Take 1 tablet (75 mcg total) by mouth daily before breakfast. 30 tablet 3  . meloxicam (MOBIC) 15 MG tablet Take 15 mg by mouth daily as needed.     . Multiple Vitamin (MULTIVITAMIN) tablet Take 1 tablet by mouth daily.    . pantoprazole (PROTONIX) 40 MG tablet Take 1 tablet (40 mg total) by mouth daily. 90 tablet 2  . pentoxifylline (TRENTAL) 400 MG CR tablet Take 400 mg by mouth 2 (two) times daily.     . Probiotic Product (ALIGN) 4 MG CAPS Take 1 capsule by mouth daily.     . sennosides-docusate sodium (SENOKOT-S) 8.6-50 MG tablet Take 1 tablet by mouth daily as needed for constipation.    . traMADol (ULTRAM) 50 MG tablet Take 1-2 tablets by mouth as needed every 8 hours for pain. 30 tablet 0  . triamcinolone (KENALOG) 0.1 % cream Apply 1  application topically. Apply topically to affected area once daily     .  nebivolol (BYSTOLIC) 10 MG tablet Take 1 tablet (10 mg total) by mouth 2 (two) times daily. 180 tablet 2   No facility-administered medications prior to visit.    Allergies  Allergen Reactions  . Oxycodone Swelling    Facial swelling and tightness  . Penicillins Swelling    Rash with welps  . Shellfish Allergy Hives, Shortness Of Breath and Swelling  . Iodine     Rash  . Sulfa Antibiotics Rash    Review of Systems  Constitutional: Negative for fever, chills and malaise/fatigue.  HENT: Negative for congestion and hearing loss.   Eyes: Negative for discharge.  Respiratory: Negative for cough, sputum production and shortness of breath.   Cardiovascular: Negative for chest pain, palpitations and leg swelling.  Gastrointestinal: Negative for heartburn, nausea, vomiting, abdominal pain, diarrhea, constipation and blood in stool.  Genitourinary: Negative for dysuria, urgency, frequency and hematuria.  Musculoskeletal: Negative for myalgias, back pain and falls.  Skin: Negative for rash.  Neurological: Negative for dizziness, sensory change, loss of consciousness, weakness and headaches.  Endo/Heme/Allergies: Negative for environmental allergies. Does not bruise/bleed easily.  Psychiatric/Behavioral: Negative for depression and suicidal ideas. The patient is not nervous/anxious and does not have insomnia.        Objective:    Physical Exam  Constitutional: She is oriented to person, place, and time. She appears well-developed and well-nourished. No distress.  HENT:  Head: Normocephalic and atraumatic.  Nose: Nose normal.  Eyes: Right eye exhibits no discharge. Left eye exhibits no discharge.  Neck: Normal range of motion. Neck supple.  Cardiovascular: Normal rate and regular rhythm.   No murmur heard. Pulmonary/Chest: Effort normal and breath sounds normal.  Abdominal: Soft. Bowel sounds are normal. There  is no tenderness.  Musculoskeletal: She exhibits no edema.  Neurological: She is alert and oriented to person, place, and time.  Skin: Skin is warm and dry.  Psychiatric: She has a normal mood and affect.  Nursing note and vitals reviewed.   BP 138/78 mmHg  Pulse 87  Temp(Src) 98 F (36.7 C) (Oral)  Ht 5' 2"  (1.575 m)  Wt 237 lb 2 oz (107.559 kg)  BMI 43.36 kg/m2  SpO2 97% Wt Readings from Last 3 Encounters:  05/30/15 237 lb 2 oz (107.559 kg)  05/17/15 226 lb 12.8 oz (102.876 kg)  04/14/15 231 lb 3.2 oz (104.872 kg)     Lab Results  Component Value Date   WBC 3.8* 03/31/2015   HGB 11.6 03/31/2015   HCT 37.2 03/31/2015   PLT 198 03/31/2015   GLUCOSE 105* 05/26/2015   CHOL 133 05/26/2015   TRIG 111.0 05/26/2015   HDL 52.50 05/26/2015   LDLDIRECT 71.0 11/19/2014   LDLCALC 58 05/26/2015   ALT 20 05/26/2015   AST 19 05/26/2015   NA 140 05/26/2015   K 4.5 05/26/2015   CL 102 05/26/2015   CREATININE 0.76 05/26/2015   BUN 10 05/26/2015   CO2 31 05/26/2015   TSH 4.84* 05/26/2015   INR 0.97 08/16/2014   HGBA1C 6.6* 05/27/2015   MICROALBUR <0.7 05/27/2015    Lab Results  Component Value Date   TSH 4.84* 05/26/2015   Lab Results  Component Value Date   WBC 3.8* 03/31/2015   HGB 11.6 03/31/2015   HCT 37.2 03/31/2015   MCV 69.5* 03/31/2015   PLT 198 03/31/2015   Lab Results  Component Value Date   NA 140 05/26/2015   K 4.5 05/26/2015   CHLORIDE 108 03/31/2015   CO2 31  05/26/2015   GLUCOSE 105* 05/26/2015   BUN 10 05/26/2015   CREATININE 0.76 05/26/2015   BILITOT 0.3 05/26/2015   ALKPHOS 82 05/26/2015   AST 19 05/26/2015   ALT 20 05/26/2015   PROT 7.0 05/26/2015   ALBUMIN 3.8 05/26/2015   CALCIUM 9.6 05/26/2015   ANIONGAP 9 03/31/2015   EGFR >90 03/31/2015   GFR 98.72 05/26/2015   Lab Results  Component Value Date   CHOL 133 05/26/2015   Lab Results  Component Value Date   HDL 52.50 05/26/2015   Lab Results  Component Value Date   LDLCALC 58  05/26/2015   Lab Results  Component Value Date   TRIG 111.0 05/26/2015   Lab Results  Component Value Date   CHOLHDL 3 05/26/2015   Lab Results  Component Value Date   HGBA1C 6.6* 05/27/2015       Assessment & Plan:   Problem List Items Addressed This Visit    Anemia   Relevant Orders   Hemoglobin A1c   CBC   TSH   Lipid panel   Comprehensive metabolic panel   Microalbumin / creatinine urine ratio   Diabetes mellitus type 2, controlled ([place]) - Primary    hgba1c acceptable, minimize simple carbs. Increase exercise as tolerated. Continue current meds. Up slightly       Relevant Orders   Hemoglobin A1c   CBC   TSH   Lipid panel   Comprehensive metabolic panel   Microalbumin / creatinine urine ratio   Endometrial cancer ([place])    Has finished treatments, noted some neuropathy in right leg with pain and cold in right and left foot       Relevant Orders   Hemoglobin A1c   CBC   TSH   Lipid panel   Comprehensive metabolic panel   Microalbumin / creatinine urine ratio   Essential hypertension    Well controlled, no changes to meds. Encouraged heart healthy diet such as the DASH diet and exercise as tolerated. Will switch from Bystolic to Carvedilol due to cost concerns.      Relevant Medications   carvedilol (COREG) 12.5 MG tablet   Other Relevant Orders   Hemoglobin A1c   CBC   TSH   Lipid panel   Comprehensive metabolic panel   Microalbumin / creatinine urine ratio   GERD    Avoid offending foods, start probiotics. Do not eat large meals in late evening and consider raising head of bed.       Relevant Orders   Hemoglobin A1c   CBC   TSH   Lipid panel   Comprehensive metabolic panel   Microalbumin / creatinine urine ratio   Hyperlipidemia    Tolerating statin, encouraged heart healthy diet, avoid trans fats, minimize simple carbs and saturated fats. Increase exercise as tolerated      Relevant Medications   carvedilol (COREG) 12.5 MG tablet    Other Relevant Orders   Hemoglobin A1c   CBC   TSH   Lipid panel   Comprehensive metabolic panel   Microalbumin / creatinine urine ratio   Low back pain radiating to right leg    Follows with Dr Nelva Bush and shots have been helpful      Relevant Orders   Hemoglobin A1c   CBC   TSH   Lipid panel   Comprehensive metabolic panel   Microalbumin / creatinine urine ratio   Obesity    Encouraged DASH diet, decrease po intake and increase exercise as tolerated. Needs  7-8 hours of sleep nightly. Avoid trans fats, eat small, frequent meals every 4-5 hours with lean proteins, complex carbs and healthy fats. Minimize simple carbs, GMO foods.      Relevant Orders   Hemoglobin A1c   CBC   TSH   Lipid panel   Comprehensive metabolic panel   Microalbumin / creatinine urine ratio   Thyroid disease   Relevant Medications   carvedilol (COREG) 12.5 MG tablet   Other Relevant Orders   Hemoglobin A1c   CBC   TSH   Lipid panel   Comprehensive metabolic panel   Microalbumin / creatinine urine ratio      I have discontinued Ms. Mcwhirt's nebivolol. I am also having her start on carvedilol. Additionally, I am having her maintain her aspirin, pentoxifylline, triamcinolone cream, multivitamin, meloxicam, budesonide-formoterol, albuterol, fluticasone, ALIGN, diclofenac sodium, albuterol, pantoprazole, atorvastatin, traMADol, ferrous fumarate, diphenhydramine-acetaminophen, furosemide, benazepril, amLODipine, sennosides-docusate sodium, HYDROcodone-homatropine, gabapentin, HYDROcodone-acetaminophen, cyclobenzaprine, HYDROcodone-acetaminophen, and levothyroxine.  Meds ordered this encounter  Medications  . carvedilol (COREG) 12.5 MG tablet    Sig: Take 1 tablet (12.5 mg total) by mouth 2 (two) times daily with a meal.    Dispense:  60 tablet    Refill:  3     Penni Homans, MD

## 2015-06-08 ENCOUNTER — Telehealth: Payer: Self-pay | Admitting: Oncology

## 2015-06-08 NOTE — Telephone Encounter (Signed)
Called patient and she is aware of her 07/2015 appointments

## 2015-06-29 ENCOUNTER — Encounter: Payer: Self-pay | Admitting: Podiatry

## 2015-06-29 ENCOUNTER — Ambulatory Visit (INDEPENDENT_AMBULATORY_CARE_PROVIDER_SITE_OTHER): Payer: Commercial Managed Care - HMO | Admitting: Podiatry

## 2015-06-29 DIAGNOSIS — T451X5A Adverse effect of antineoplastic and immunosuppressive drugs, initial encounter: Secondary | ICD-10-CM | POA: Diagnosis not present

## 2015-06-29 DIAGNOSIS — B351 Tinea unguium: Secondary | ICD-10-CM

## 2015-06-29 DIAGNOSIS — G620 Drug-induced polyneuropathy: Secondary | ICD-10-CM | POA: Diagnosis not present

## 2015-06-29 NOTE — Patient Instructions (Signed)
Diabetes and Foot Care Diabetes may cause you to have problems because of poor blood supply (circulation) to your feet and legs. This may cause the skin on your feet to become thinner, break easier, and heal more slowly. Your skin may become dry, and the skin may peel and crack. You may also have nerve damage in your legs and feet causing decreased feeling in them. You may not notice minor injuries to your feet that could lead to infections or more serious problems. Taking care of your feet is one of the most important things you can do for yourself.  HOME CARE INSTRUCTIONS  Wear shoes at all times, even in the house. Do not go barefoot. Bare feet are easily injured.  Check your feet daily for blisters, cuts, and redness. If you cannot see the bottom of your feet, use a mirror or ask someone for help.  Wash your feet with warm water (do not use hot water) and mild soap. Then pat your feet and the areas between your toes until they are completely dry. Do not soak your feet as this can dry your skin.  Apply a moisturizing lotion or petroleum jelly (that does not contain alcohol and is unscented) to the skin on your feet and to dry, brittle toenails. Do not apply lotion between your toes.  Trim your toenails straight across. Do not dig under them or around the cuticle. File the edges of your nails with an emery board or nail file.  Do not cut corns or calluses or try to remove them with medicine.  Wear clean socks or stockings every day. Make sure they are not too tight. Do not wear knee-high stockings since they may decrease blood flow to your legs.  Wear shoes that fit properly and have enough cushioning. To break in new shoes, wear them for just a few hours a day. This prevents you from injuring your feet. Always look in your shoes before you put them on to be sure there are no objects inside.  Do not cross your legs. This may decrease the blood flow to your feet.  If you find a minor scrape,  cut, or break in the skin on your feet, keep it and the skin around it clean and dry. These areas may be cleansed with mild soap and water. Do not cleanse the area with peroxide, alcohol, or iodine.  When you remove an adhesive bandage, be sure not to damage the skin around it.  If you have a wound, look at it several times a day to make sure it is healing.  Do not use heating pads or hot water bottles. They may burn your skin. If you have lost feeling in your feet or legs, you may not know it is happening until it is too late.  Make sure your health care provider performs a complete foot exam at least annually or more often if you have foot problems. Report any cuts, sores, or bruises to your health care provider immediately. SEEK MEDICAL CARE IF:   You have an injury that is not healing.  You have cuts or breaks in the skin.  You have an ingrown nail.  You notice redness on your legs or feet.  You feel burning or tingling in your legs or feet.  You have pain or cramps in your legs and feet.  Your legs or feet are numb.  Your feet always feel cold. SEEK IMMEDIATE MEDICAL CARE IF:   There is increasing redness,   swelling, or pain in or around a wound.  There is a red line that goes up your leg.  Pus is coming from a wound.  You develop a fever or as directed by your health care provider.  You notice a bad smell coming from an ulcer or wound.   This information is not intended to replace advice given to you by your health care provider. Make sure you discuss any questions you have with your health care provider.   Document Released: 07/06/2000 Document Revised: 03/11/2013 Document Reviewed: 12/16/2012 Elsevier Interactive Patient Education 2016 [employer]vier Inc.  

## 2015-06-30 ENCOUNTER — Other Ambulatory Visit: Payer: Self-pay | Admitting: Family Medicine

## 2015-06-30 MED ORDER — CARVEDILOL 12.5 MG PO TABS: 12.5000 mg | ORAL_TABLET | Freq: Two times a day (BID) | ORAL | Status: DC

## 2015-06-30 NOTE — Progress Notes (Signed)
Patient ID: Katelyn Fisher, female   DOB: 31-Mar-1951, 64 y.o.   MRN: [id number]  Subjective: This patient presents again for a schedule visit requesting debridement of long thick toenails. 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

## 2015-07-06 ENCOUNTER — Other Ambulatory Visit: Payer: Self-pay

## 2015-07-06 ENCOUNTER — Telehealth: Payer: Self-pay | Admitting: Family Medicine

## 2015-07-06 DIAGNOSIS — C541 Malignant neoplasm of endometrium: Secondary | ICD-10-CM

## 2015-07-06 NOTE — Telephone Encounter (Signed)
Caller name: Self   Can be reached: [phone]  Pharmacy:  [facility] [place], [place] [phone] (Phone) [phone] (Fax)         Reason for call: Request refill on atorvastatin (LIPITOR) 20 MG tablet [id number] would like a 3 month supply

## 2015-07-07 ENCOUNTER — Ambulatory Visit (HOSPITAL_BASED_OUTPATIENT_CLINIC_OR_DEPARTMENT_OTHER): Payer: Commercial Managed Care - HMO

## 2015-07-07 ENCOUNTER — Encounter: Payer: Self-pay | Admitting: Gynecologic Oncology

## 2015-07-07 ENCOUNTER — Other Ambulatory Visit: Payer: Commercial Managed Care - HMO

## 2015-07-07 ENCOUNTER — Ambulatory Visit: Payer: Commercial Managed Care - HMO | Attending: Gynecologic Oncology | Admitting: Gynecologic Oncology

## 2015-07-07 VITALS — BP 121/77 | HR 89 | Temp 98.0°F | Resp 20 | Ht 62.0 in | Wt 239.2 lb

## 2015-07-07 DIAGNOSIS — C541 Malignant neoplasm of endometrium: Secondary | ICD-10-CM | POA: Diagnosis present

## 2015-07-07 DIAGNOSIS — Z95828 Presence of other vascular implants and grafts: Secondary | ICD-10-CM

## 2015-07-07 MED ORDER — SODIUM CHLORIDE 0.9 % IJ SOLN
10.0000 mL | INTRAMUSCULAR | Status: DC | PRN
  Administered 2015-07-07: 10 mL via INTRAVENOUS
  Filled 2015-07-07: qty 10

## 2015-07-07 MED ORDER — HEPARIN SOD (PORK) LOCK FLUSH 100 UNIT/ML IV SOLN
500.0000 [IU] | Freq: Once | INTRAVENOUS | Status: AC
  Administered 2015-07-07: 500 [IU] via INTRAVENOUS
  Filled 2015-07-07: qty 5

## 2015-07-07 MED ORDER — ATORVASTATIN CALCIUM 20 MG PO TABS: 10.0000 mg | ORAL_TABLET | Freq: Every day | ORAL | Status: DC

## 2015-07-07 NOTE — Patient Instructions (Signed)
Plan to follow up in Sept 2017 or sooner if needed.  Please call in the summer of 2017 to schedule an appt.

## 2015-07-07 NOTE — Progress Notes (Signed)
GYNECOLOGIC ONCOLOGY OFFICE NOTE   Chief Complaint: Uterine serous endometrial cancer. Surveillance   Assessment/Plan: Katelyn Fisher is a 64 y.o. woman with pre op diagnosis of grade 1 endometrial cancer . 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   6//6 cycles adjuvant platinum and taxane chemotherapy and  vaginal brachytherapy completed 12/2014 Follow-up with Gyn Onc 03/2016  Follow-up with Dr Marko Plume  07/2015 and Rad Onc at [facility] 09/2015 Follow-up with Dr. Dellis Filbert 12/2015  History of Present Illness: Katelyn Fisher is a 64 y.o. who presented with a two-week history of postmenopausal bleeding. A pelvic ultrasound showed fibroids and a thickened endometrial stripe. She subsequently underwent an endometrial biopsy which was concerning for at least grade 1 endometrioid adenocarcinoma.   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 US showed fibroids and thickened EMS. EMB showed "at least grade 1 endometriod adenoca" on EMB by primary gynecologist.    06/23/2014  Surgery  RATLH, BSO, BPLND. Findings: enlarged fibroid uterus, no e/o mets. IOFS: grade 1, focal LVSI. Final path: mixed serous (50%) and endometrioid (50%), 2.0cm size, 17% invasion, no cervical involvement, +LVSI, all pelvic LN neg.     Pathology: A: Uterus, cervix, bilateral ovaries and fallopian tubes, total hysterectomy and bilateral salpingo-oophorectomy  Histologic type: mixed high grade carcinoma (serous carcinoma 50%, endometrioid adenocarcinoma 50%)   Tumor size (gross): 2.0 cm (microscopic measurement)   Myometrial invasion: Inner half Depth: 5 mm Wall thickness: 3 cm Percent: 17% Lymphovascular space invasion: present   Endometrium: focal endometrial intraepithelial carcinoma  (serous in situ carcinoma) predominantly   - Six lymph nodes negative for metastatic carcinoma (0/6)   C: Lymph nodes, left pelvic, excision  - Nine lymph nodes, negative for metastatic carcinoma (0/9)   Post op PET to assess LN without evidence of metastatic disease.   6 cycles taxol/Carbo completed 01/2015.   Radiation Treatment Dates: 12/17/14-01/03/15 Site/Dose: HDR Brachytherapy with 30 Gy in 5 fractions Extended cancer gene panel negative. Mammogram 11/2014 wnl  CT 03/03/2015 IMPRESSION: 1. Interval hysterectomy. No evidence of local recurrence or metastatic disease. 2. No acute abdominal pelvic findings. 3. Distal colonic diverticulosis with possible rectal wall thickening, possibly related to prior radiation therapy. Correlate clinically.   Doing well.  No complaints.  No vaginal bleeding no N/v  Past Medical History:  Past Medical History   Past Medical History   Diagnosis  Date   .  Asthma    .  Morbid obesity (RAF-HCC)      BMI 44   .  Hypertension    .  GERD (gastroesophageal reflux disease)    .  Arthritis    .  Depression    .  Back pain    .  Thyroid disease  05/2013   .  Vertigo  10/2012     benign paroxysmal positional       Past Surgical History:  Past Surgical History   Past Surgical History   Procedure  Laterality  Date   .  Dilation and curettage of uterus   2102     for PMB   .  Hysteroscopy   2012     with resection of fibroids   .  Tubal ligation     .  Laparoscopic cholecystectomy     .  Pr laparoscopy w tot hysterectuterus <=250 gram w tube/ovary  Midline  06/23/2014     Procedure: ROBOTIC LAPAROSCOPY, SURGICAL, WITH TOTAL HYSTERECTOMY, FOR UTERUS 250 G OR LESS; W/REMOVAL TUBE(S) AND/OR OVARY(S); Surgeon: Devra Dopp, MD; Location: MAIN OR [facility]; Service: Gynecology Oncology   .  Pr lap,pelvic lymphadenectomy/bx  Bilateral  06/23/2014     Procedure: ROBOTIC LAPAROSCOPY, SURGICAL; WITH BILATERAL TOTAL PELVIC LYMPHADENECTOMY PERI-AORTIC LYMPH  NODE SAMPLE, SGL/MULT; Surgeon: Devra Dopp, MD; Location: MAIN OR [facility]; Service: Gynecology Oncology       Family History:  Family History   Family History   Problem  Relation  Age of Onset   .  Heart disease  Maternal Aunt    .  Lung cancer  Father    .  Cancer  Maternal Aunt  52     Breast   .  Cancer  Maternal Aunt  35     Breast   .  Diabetes  Mother    .  Lupus  Mother    .  Diabetes  Father    .  Prostate cancer  Father    .  Diabetes  Brother    .  Heart disease  Brother      ROS: No visual changes, no nausea or vomiting, no sensory or motor changes, no flank pain, no extremity edema, no incontinence, no vaginal bleeding. No change in bowel or bladder habits, Otherwise 10 point ROS negative.   Physical Exam: BP 121/77 mmHg  Pulse 89  Temp(Src) 98 F (36.7 C) (Oral)  Resp 20  Ht 5\' 2"  (1.575 m)  Wt 239 lb 3.2 oz (108.5 kg)  BMI 43.74 kg/m2  SpO2 100%  Wt Readings from Last 3 Encounters:  07/07/15 239 lb 3.2 oz (108.5 kg)  05/30/15 237 lb 2 oz (107.559 kg)  05/17/15 226 lb 12.8 oz (102.876 kg)   WD female in NAD Chest: CTA Back: No CVAT Abdomen: Obese. Soft, nondistended, nontender to palpation. No masses or hepatosplenomegaly appreciated.Port sites intact, no tenderness or masses  GU/Abdominal: Normal external female genitalia. No lesions. Cuff intact, no bleeding or discharge Pelvic:  Nl EGBUS, no massess, no discharge no bleeding.  No cul de sac masses. Rectal:  Good tone no masses. Back:  No CVAT LN:  No cervical supraclavicular or inguinal adenopathy Ext No CCE

## 2015-07-07 NOTE — Telephone Encounter (Signed)
done

## 2015-07-19 ENCOUNTER — Telehealth: Payer: Self-pay

## 2015-07-19 NOTE — Telephone Encounter (Signed)
[name] called from [facility] stating that she has a cold and needs a Z-Pack called in for her.  Her PCP was on vacation.  She would have to be seen first to have ATB called in for her. Recomended that she go to a local Urgent Care to be evaluated.  Katelyn Fisher agreed to this suggestion.

## 2015-07-23 ENCOUNTER — Ambulatory Visit (INDEPENDENT_AMBULATORY_CARE_PROVIDER_SITE_OTHER): Payer: Commercial Managed Care - HMO | Admitting: Internal Medicine

## 2015-07-23 ENCOUNTER — Encounter: Payer: Self-pay | Admitting: Internal Medicine

## 2015-07-23 VITALS — BP 140/84 | HR 99 | Temp 98.6°F | Ht 62.0 in | Wt 238.0 lb

## 2015-07-23 DIAGNOSIS — M5431 Sciatica, right side: Secondary | ICD-10-CM

## 2015-07-23 DIAGNOSIS — J069 Acute upper respiratory infection, unspecified: Secondary | ICD-10-CM | POA: Diagnosis not present

## 2015-07-23 MED ORDER — PREDNISONE 10 MG PO TABS: ORAL_TABLET | ORAL | Status: DC

## 2015-07-23 MED ORDER — HYDROCOD POLST-CPM POLST ER 10-8 MG/5ML PO SUER: 5.0000 mL | Freq: Two times a day (BID) | ORAL | Status: DC | PRN

## 2015-07-23 MED ORDER — AZITHROMYCIN 250 MG PO TABS: ORAL_TABLET | ORAL | Status: DC

## 2015-07-23 NOTE — Progress Notes (Signed)
HPI  Pt presents to the clinic today with c/o sore throat, cough and chest congestion. This started 1 week ago. The cough is productive of green mucous. She reports she has been mildly short of breath. She denies fever but has had chills and body aches. She has tried Mucinex and Delsym with minimal relief. She does have a history of asthma. She has used her Albuterol and Symbicort as prescribed. She has had sick contacts. She does not smoke.  She also reports back pain. This started 6 months ago. She has been seeing an orthopedic/neurosurgeon. She has been getting injections in her back. This flared up 4 days ago. The pain starts in the middle of her back. It radiates down her right leg. there is associated numbness and tingling. She reports the Hydrocodone is not helping.  Review of Systems      Past Medical History  Diagnosis Date  . Asthma   . Hypertension   . Obesity   . Hx of colonic polyps   . GERD (gastroesophageal reflux disease)   . History of colonic diverticulitis   . Arthritis     back  . Hyperlipidemia   . Depression   . Low back pain radiating to right leg 08/18/2012  . Anxiety   . Benign paroxysmal positional vertigo 11/01/2012  . Thyroid disease 06/01/2013  . Cervical cancer screening 10/09/2010    Qualifier: Diagnosis of  By: Wynona Luna   . 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  . Preventative health care 12/09/2013  . Family history of breast cancer   . Family history of ovarian cancer   . Family history of prostate cancer   . Family history of colon cancer   . Radiation 12/17/14-01/03/15    vaginal brachytherapy  . Cancer [facility])     endometrial    Family History  Problem Relation Age of Onset  . Heart disease Maternal Aunt     x 3 -2 brothers  . Hypertension Maternal Aunt   . Breast cancer Maternal Aunt     maternal half; dx in her 50s  . Ovarian cancer Maternal Aunt     dx in her 39s  . Prostate cancer Father 55  . Diabetes Father   . Cancer Father    lung cancer, asbestos exposure  . Heart disease Brother   . Diabetes Brother   . Other      no FH colon cancer  . Diabetes Mother   . Lupus Mother   . Heart disease Brother   . Hyperlipidemia Sister   . Heart disease Brother   . Diabetes Brother   . Colon polyps Brother   . Heart disease Brother   . Diabetes Brother   . Colon polyps Brother   . Colon cancer Maternal Grandmother     dx in her 66s  . Leukemia Maternal Uncle 21  . Cancer Paternal Aunt     NOS  . Prostate cancer Paternal Uncle     dx in his 60s  . Breast cancer Other     dx in her 16s    Social History   Social History  . Marital Status: Married    Spouse Name: N/A  . Number of Children: N/A  . Years of Education: N/A   Occupational History  . Not on file.   Social History Main Topics  . Smoking status: Never Smoker   . Smokeless tobacco: Never Used  . Alcohol Use: No  . Drug Use: No  . Sexual  Activity:    Partners: Male     Comment: lives with husband, no dietary restrictions   G2 P 2   Other Topics Concern  . Not on file   Social History Narrative   Married- 66 years   Never Smoked   Alcohol use-no   Drug use-no   Occupation: housewife   Caffeine use/day:  None   Does Patient Exercise:  yes    Allergies  Allergen Reactions  . Oxycodone Swelling    Facial swelling and tightness  . Penicillins Swelling    Rash with welps  . Shellfish Allergy Hives, Shortness Of Breath and Swelling  . Iodine     Rash  . Sulfa Antibiotics Rash     Constitutional: Positive fatigue. Denies headache, fever or abrupt weight changes.  HEENT:  Positive sore throat. Denies eye redness, eye pain, pressure behind the eyes, facial pain, nasal congestion, ear pain, ringing in the ears, wax buildup, runny nose or bloody nose. Respiratory: Positive cough. Denies difficulty breathing or shortness of breath.  Cardiovascular: Denies chest pain, chest tightness, palpitations or swelling in the hands or feet.  MSK:  Pt reports back pain. Denies joint pain or swelling.  No other specific complaints in a complete review of systems (except as listed in HPI above).  Objective:   BP 140/84 mmHg  Pulse 99  Temp(Src) 98.6 F (37 C) (Oral)  Ht 5\' 2"  (1.575 m)  Wt 238 lb (107.956 kg)  BMI 43.52 kg/m2  SpO2 99%  Wt Readings from Last 3 Encounters:  07/07/15 239 lb 3.2 oz (108.5 kg)  05/30/15 237 lb 2 oz (107.559 kg)  05/17/15 226 lb 12.8 oz (102.876 kg)     General: Appears her stated age, obes in NAD. HEENT: Head: normal shape and size, no sinus tenderness noted; Eyes: sclera white, no icterus, conjunctiva pink; Ears: Tm's gray and intact, normal light reflex;Throat/Mouth: Teeth present, mucosa pink and moist, no exudate noted, no lesions or ulcerations noted.  Neck: No cervical lymphadenopathy.  Cardiovascular: Normal rate and rhythm. S1,S2 noted.  No murmur, rubs or gallops noted.  Pulmonary/Chest: Normal effort and positive vesicular breath sounds. No respiratory distress. No wheezes, rales or ronchi noted.  MSK: Normal flexion, extension and rotation of the spine. Bony tenderness noted over the lumbar spine. Strength 5/5 BLE.    Assessment & Plan:   Upper Respiratory Infection:  Get some rest and drink plenty of water Do salt water gargles for the sore throat eRx for Azithromax x 5 days Rx for Tussionex cough syrup She has Albuterol to take as needed  Low back pain with right side sciatica:  Back exercises given eRx for Pred Taper Continue Gabapentin Continue Hydrocodone as needed for severe pain She will continue to follow with her orthopedic/neurosurgeon  RTC as needed or if symptoms persist.

## 2015-07-23 NOTE — Progress Notes (Signed)
Pre visit review using our clinic review tool, if applicable. No additional management support is needed unless otherwise documented below in the visit note. 

## 2015-07-23 NOTE — Patient Instructions (Signed)

## 2015-07-26 ENCOUNTER — Ambulatory Visit: Payer: Commercial Managed Care - HMO | Admitting: Family Medicine

## 2015-07-28 ENCOUNTER — Other Ambulatory Visit: Payer: Self-pay | Admitting: Internal Medicine

## 2015-07-28 ENCOUNTER — Encounter: Payer: Self-pay | Admitting: Pediatrics

## 2015-07-28 ENCOUNTER — Ambulatory Visit (INDEPENDENT_AMBULATORY_CARE_PROVIDER_SITE_OTHER): Admitting: Pediatrics

## 2015-07-28 VITALS — BP 142/78 | HR 76 | Temp 98.8°F | Resp 18 | Ht 61.7 in | Wt 234.3 lb

## 2015-07-28 DIAGNOSIS — J4541 Moderate persistent asthma with (acute) exacerbation: Secondary | ICD-10-CM

## 2015-07-28 DIAGNOSIS — C541 Malignant neoplasm of endometrium: Secondary | ICD-10-CM | POA: Diagnosis not present

## 2015-07-28 DIAGNOSIS — K219 Gastro-esophageal reflux disease without esophagitis: Secondary | ICD-10-CM

## 2015-07-28 MED ORDER — ALBUTEROL SULFATE (2.5 MG/3ML) 0.083% IN NEBU
2.5000 mg | INHALATION_SOLUTION | Freq: Once | RESPIRATORY_TRACT | Status: AC
  Administered 2015-07-28: 2.5 mg via RESPIRATORY_TRACT

## 2015-07-29 DIAGNOSIS — J45901 Unspecified asthma with (acute) exacerbation: Secondary | ICD-10-CM | POA: Insufficient documentation

## 2015-07-29 NOTE — Progress Notes (Signed)
  [place] [id number] Dept: [phone]  FOLLOW UP NOTE  Patient ID: Katelyn Fisher, female    DOB: 08/25/1950  Age: 65 y.o. MRN: [id number] Date of Office Visit: 07/28/2015  Assessment Chief Complaint: Cough and Wheezing  HPI Katelyn Fisher presents for evaluation of coughing spells and some shortness of breath. She saw her primary care physician 6 days ago and was given a Zithromax pack and some prednisone. Her nebulizer equipment broke and a request was sent to [facility] for replacement. She has not heard back from them  Her current medications are Pro-air 2 puffs every 4 hours if needed, albuterol 0.083% one unit dose every 4 hours if needed, Symbicort 160-4.5- 2 puffs 2 times a day, fluticasone 2 sprays per nostril once a day, pantoprazole 40 mg once a day. Her other medications are outlined in the chart.   Drug Allergies:  Allergies  Allergen Reactions  . Oxycodone Swelling    Facial swelling and tightness  . Penicillins Swelling    Rash with welps  . Shellfish Allergy Hives, Shortness Of Breath and Swelling  . Iodine     Rash  . Sulfa Antibiotics Rash    Physical Exam: BP 142/78 mmHg  Pulse 76  Temp(Src) 98.8 F (37.1 C) (Oral)  Resp 18  Ht 5' 1.7" (1.567 m)  Wt 234 lb 5.6 oz (106.3 kg)  BMI 43.29 kg/m2   Physical Exam  Constitutional: She is oriented to person, place, and time. She appears well-developed and well-nourished.  HENT:  Eyes normal. Ears normal. Nose mild swelling of nasal turbinates. Pharynx normal.  Neck: Neck supple.  Cardiovascular:  S1 and S2 normal no murmurs  Pulmonary/Chest:  Clear to percussion auscultation except for mild wheezing in both lungs  Lymphadenopathy:    She has no cervical adenopathy.  Neurological: She is alert and oriented to person, place, and time.  Psychiatric: She has a normal mood and affect. Her behavior is normal. Judgment and thought content normal.  Vitals reviewed.   Diagnostics:  FVC  1.75 L FEV1 1.41 L. Predicted FVC 2.34 L predicted FEV1 1.82 L-his shows a mild reduction in the FVC  Assessment and Plan: 1. Asthma with acute exacerbation, moderate persistent   2. Endometrial cancer ([place])   3. Gastroesophageal reflux disease without esophagitis     Meds ordered this encounter  Medications  . albuterol (PROVENTIL) (2.5 MG/3ML) 0.083% nebulizer solution 2.5 mg    Sig:     Patient Instructions  Continue on the treatment plan above She will call [employer] about her nebulizer and we will call [facility] which will provide the nebulizer once approved by [facility] Add prednisone 20 mg twice a day for 3 days, 20 mg on the fourth day, 10 mg on the fifth day Call me if you're not doing better on this treatment plan    Return in about 3 months (around 10/26/2015).    Thank you for the opportunity to care for this patient.  Please do not hesitate to contact me with questions.  Penne Lash, M.D.  [facility] [address] [employer], [place] [address] [phone]

## 2015-07-29 NOTE — Patient Instructions (Addendum)
Continue on the treatment plan above She will call [employer] about her nebulizer and we will call [facility] Supply which will provide the nebulizer once approved by [facility] Add prednisone 20 mg twice a day for 3 days, 20 mg on the fourth day, 10 mg on the fifth day Call me if you're not doing better on this treatment plan

## 2015-08-08 ENCOUNTER — Telehealth: Payer: Self-pay | Admitting: Family Medicine

## 2015-08-08 NOTE — Telephone Encounter (Signed)
Called the patient to confirm if she needed the Overnight oximetry from [facility] supply.  The patient was seen on 07/28/2015 by her asthma MD and has now received her new nebulizer machine and does not need this study done.

## 2015-08-11 ENCOUNTER — Telehealth: Payer: Self-pay | Admitting: Oncology

## 2015-08-11 ENCOUNTER — Other Ambulatory Visit: Payer: Self-pay | Admitting: Oncology

## 2015-08-11 DIAGNOSIS — C541 Malignant neoplasm of endometrium: Secondary | ICD-10-CM

## 2015-08-11 NOTE — Telephone Encounter (Signed)
Left message for patient regarding appointment changes. New schedule printed and sent via mail.

## 2015-08-13 ENCOUNTER — Other Ambulatory Visit: Payer: Self-pay | Admitting: Oncology

## 2015-08-18 ENCOUNTER — Ambulatory Visit: Payer: Commercial Managed Care - HMO | Admitting: Oncology

## 2015-08-18 ENCOUNTER — Other Ambulatory Visit: Payer: Commercial Managed Care - HMO

## 2015-08-19 ENCOUNTER — Ambulatory Visit: Payer: Commercial Managed Care - HMO | Admitting: Radiation Oncology

## 2015-08-24 ENCOUNTER — Other Ambulatory Visit (HOSPITAL_BASED_OUTPATIENT_CLINIC_OR_DEPARTMENT_OTHER): Payer: Commercial Managed Care - HMO

## 2015-08-24 ENCOUNTER — Telehealth: Payer: Self-pay

## 2015-08-24 ENCOUNTER — Ambulatory Visit
Admission: RE | Admit: 2015-08-24 | Payer: Commercial Managed Care - HMO | Source: Ambulatory Visit | Admitting: Radiation Oncology

## 2015-08-24 ENCOUNTER — Ambulatory Visit (HOSPITAL_BASED_OUTPATIENT_CLINIC_OR_DEPARTMENT_OTHER): Payer: Commercial Managed Care - HMO

## 2015-08-24 VITALS — BP 142/78 | HR 72 | Temp 97.0°F | Resp 18

## 2015-08-24 DIAGNOSIS — C541 Malignant neoplasm of endometrium: Secondary | ICD-10-CM

## 2015-08-24 DIAGNOSIS — Z95828 Presence of other vascular implants and grafts: Secondary | ICD-10-CM

## 2015-08-24 LAB — COMPREHENSIVE METABOLIC PANEL
ALT: 20 U/L (ref 0–55)
ANION GAP: 8 meq/L (ref 3–11)
AST: 15 U/L (ref 5–34)
Albumin: 3.5 g/dL (ref 3.5–5.0)
Alkaline Phosphatase: 84 U/L (ref 40–150)
BUN: 11 mg/dL (ref 7.0–26.0)
CALCIUM: 9.4 mg/dL (ref 8.4–10.4)
CHLORIDE: 103 meq/L (ref 98–109)
CO2: 26 mEq/L (ref 22–29)
Creatinine: 0.8 mg/dL (ref 0.6–1.1)
EGFR: 87 mL/min/{1.73_m2} — ABNORMAL LOW (ref >90)
Glucose: 115 mg/dl (ref 70–140)
POTASSIUM: 3.9 meq/L (ref 3.5–5.1)
Sodium: 138 mEq/L (ref 136–145)
Total Bilirubin: 0.36 mg/dL (ref 0.20–1.20)
Total Protein: 6.8 g/dL (ref 6.4–8.3)

## 2015-08-24 LAB — CBC WITH DIFFERENTIAL/PLATELET
BASO%: 0.2 % (ref 0.0–2.0)
BASOS ABS: 0 10*3/uL (ref 0.0–0.1)
EOS%: 2 % (ref 0.0–7.0)
Eosinophils Absolute: 0.1 10*3/uL (ref 0.0–0.5)
HEMATOCRIT: 37.9 % (ref 34.8–46.6)
HGB: 11.7 g/dL (ref 11.6–15.9)
LYMPH#: 1.8 10*3/uL (ref 0.9–3.3)
LYMPH%: 36.4 % (ref 14.0–49.7)
MCH: 21.1 pg — AB (ref 25.1–34.0)
MCHC: 30.9 g/dL — AB (ref 31.5–36.0)
MCV: 68.3 fL — ABNORMAL LOW (ref 79.5–101.0)
MONO#: 0.6 10*3/uL (ref 0.1–0.9)
MONO%: 11.2 % (ref 0.0–14.0)
NEUT#: 2.5 10*3/uL (ref 1.5–6.5)
NEUT%: 50.2 % (ref 38.4–76.8)
PLATELETS: 235 10*3/uL (ref 145–400)
RBC: 5.55 10*6/uL — ABNORMAL HIGH (ref 3.70–5.45)
RDW: 16.3 % — ABNORMAL HIGH (ref 11.2–14.5)
WBC: 4.9 10*3/uL (ref 3.9–10.3)

## 2015-08-24 MED ORDER — HEPARIN SOD (PORK) LOCK FLUSH 100 UNIT/ML IV SOLN
500.0000 [IU] | Freq: Once | INTRAVENOUS | Status: AC
  Administered 2015-08-24: 500 [IU] via INTRAVENOUS
  Filled 2015-08-24: qty 5

## 2015-08-24 MED ORDER — SODIUM CHLORIDE 0.9% FLUSH
10.0000 mL | INTRAVENOUS | Status: DC | PRN
  Administered 2015-08-24: 10 mL via INTRAVENOUS
  Filled 2015-08-24: qty 10

## 2015-08-24 MED ORDER — FERROUS FUMARATE 325 (106 FE) MG PO TABS
ORAL_TABLET | ORAL | Status: DC
Start: 2015-08-24 — End: 2016-07-30

## 2015-08-24 NOTE — Telephone Encounter (Signed)
Discussed lab results from 08-24-15 and follow up as noted below by Dr. Marko Plume. Sent a refills for iron to pharmacy.  Told Katelyn Fisher that once the refills are finished with Dr. Marko Plume that she needs to request refill from Dr. Charlett Blake.  Katelyn Fisher verbalized understanding.

## 2015-08-24 NOTE — Telephone Encounter (Signed)
-----   Message from Gordy Levan, MD sent at 08/24/2015  3:12 PM EST ----- Regarding: RE: LAB Results Please let patient know labs overall look good, tho she still may be a little low on iron. Suggest she stay on iron for another 2-3 months. I will let her PCP Dr Charlett Blake know, as she could check on this in follow up. I am glad to see patient back if needed, but will not set up scheduled return visit now given all other MDs following  Thanks ----- Message -----    From: Baruch Merl, RN    Sent: 08/24/2015   2:26 PM      To: Gordy Levan, MD Subject: LAB Results                                    Results from 08-24-15 in [place]  ----- Message -----    From: Gordy Levan, MD    Sent: 08/11/2015  11:58 AM      To: Baruch Merl, RN, Janace Hoard, RN  Has lots of other MDs following  I cancelled LL + lab 1-26, added lab only to Dr Pearlie Oyster apt 2-1. NEED TO BE SURE TO SEE THOSE LABS AND LET PATIENT KNOW. If ok then, I will just see prn due to # of MDs following. If concerns, will need to reschedule to me.

## 2015-08-24 NOTE — Patient Instructions (Signed)

## 2015-08-26 ENCOUNTER — Ambulatory Visit
Admission: RE | Admit: 2015-08-26 | Payer: Commercial Managed Care - HMO | Source: Ambulatory Visit | Admitting: Radiation Oncology

## 2015-08-26 ENCOUNTER — Ambulatory Visit
Admission: RE | Admit: 2015-08-26 | Discharge: 2015-08-26 | Disposition: A | Discharge status: Home / Self Care | Payer: Commercial Managed Care - HMO | Source: Ambulatory Visit | Attending: Radiation Oncology | Admitting: Radiation Oncology

## 2015-08-30 ENCOUNTER — Ambulatory Visit: Payer: Commercial Managed Care - HMO | Admitting: Family Medicine

## 2015-09-05 ENCOUNTER — Encounter: Payer: Self-pay | Admitting: Radiation Oncology

## 2015-09-05 ENCOUNTER — Other Ambulatory Visit (HOSPITAL_COMMUNITY)
Admission: RE | Admit: 2015-09-05 | Discharge: 2015-09-05 | Disposition: A | Discharge status: Home / Self Care | Payer: Commercial Managed Care - HMO | Source: Ambulatory Visit | Attending: Radiation Oncology | Admitting: Radiation Oncology

## 2015-09-05 ENCOUNTER — Other Ambulatory Visit: Payer: Self-pay

## 2015-09-05 ENCOUNTER — Ambulatory Visit
Admission: RE | Admit: 2015-09-05 | Discharge: 2015-09-05 | Disposition: A | Discharge status: Home / Self Care | Payer: Commercial Managed Care - HMO | Source: Ambulatory Visit | Attending: Radiation Oncology | Admitting: Radiation Oncology

## 2015-09-05 ENCOUNTER — Ambulatory Visit: Payer: Commercial Managed Care - HMO | Admitting: Family Medicine

## 2015-09-05 VITALS — BP 142/90 | HR 73 | Temp 97.6°F | Ht 62.0 in | Wt 239.5 lb

## 2015-09-05 DIAGNOSIS — C541 Malignant neoplasm of endometrium: Secondary | ICD-10-CM

## 2015-09-05 DIAGNOSIS — Z01411 Encounter for gynecological examination (general) (routine) with abnormal findings: Secondary | ICD-10-CM | POA: Diagnosis present

## 2015-09-05 NOTE — Progress Notes (Signed)
Radiation Oncology         [phone] ________________________________  Name: Katelyn Fisher MRN: [id number]  Date: 09/05/2015  DOB: 01-03-1951  Follow-Up Visit Note  Outpatient  CC: Penni Homans, MD  Janie Morning, MD  Diagnosis and Prior Radiotherapy:    ICD-9-CM ICD-10-CM   1. 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  She is doing well. No GI or GU complaints. Using vaginal dilator alternating with sexual intercourse regularly. No pelvic pain, no pain with intercourse. Occasionally a twinge of discomfort in pelvis.  Doing well overall.  ALLERGIES:  is allergic to oxycodone; penicillins; shellfish allergy; iodine; and sulfa antibiotics.  Meds: Current Outpatient Prescriptions  Medication Sig Dispense Refill  . albuterol (PROAIR HFA) 108 (90 BASE) MCG/ACT inhaler Inhale 2 puffs into the lungs 2 (two) times daily as needed. 3 Inhaler 2  . albuterol (PROVENTIL) (2.5 MG/3ML) 0.083% nebulizer solution Take 3 mLs (2.5 mg total) by nebulization every 6 (six) hours as needed for wheezing or shortness of breath. 150 mL 1  . amLODipine (NORVASC) 10 MG tablet Take 1 tablet (10 mg total) by mouth daily. 90 tablet 2  . aspirin 81 MG tablet Take 81 mg by mouth daily.      Marland Kitchen atorvastatin (LIPITOR) 20 MG tablet Take 0.5 tablets (10 mg total) by mouth daily. 90 tablet 1  . benazepril (LOTENSIN) 40 MG tablet Take 1 tablet (40 mg total) by mouth daily. 90 tablet 2  . budesonide-formoterol (SYMBICORT) 160-4.5 MCG/ACT inhaler Inhale 2 puffs into the lungs 2 (two) times daily. 3 Inhaler 2  . carvedilol (COREG) 12.5 MG tablet Take 1 tablet (12.5 mg total) by mouth 2 (two) times daily with a meal. 180 tablet 2  . chlorpheniramine-HYDROcodone  (TUSSIONEX PENNKINETIC ER) 10-8 MG/5ML SUER Take 5 mLs by mouth every 12 (twelve) hours as needed for cough. 140 mL 0  . cyclobenzaprine (FLEXERIL) 10 MG tablet Take 1 tablet (10 mg total) by mouth 2 (two) times daily as needed for muscle spasms. 20 tablet 0  . diclofenac sodium (VOLTAREN) 1 % GEL Apply 2 g topically 4 (four) times daily as needed.     . diphenhydramine-acetaminophen (TYLENOL PM) 25-500 MG TABS Take 1 tablet by mouth at bedtime as needed.    . ferrous fumarate (HEMOCYTE - 106 MG FE) 325 (106 Fe) MG TABS tablet Take 1 tablet daily on an empty stomach with OJ. 30 each 3  . fluticasone (FLONASE) 50 MCG/ACT nasal spray Place 2 sprays into the nose daily. 16 g 0  . furosemide (LASIX) 20 MG tablet Take 1 tablet (20 mg total) by mouth daily. 90 tablet 0  . levothyroxine (SYNTHROID, LEVOTHROID) 75 MCG tablet Take 1 tablet (75 mcg total) by mouth daily before breakfast. 30 tablet 3  . Multiple Vitamin (MULTIVITAMIN) tablet Take 1 tablet by mouth daily.    . pantoprazole (PROTONIX) 40 MG tablet Take 1 tablet (40 mg total) by mouth daily. 90 tablet 2  . Probiotic Product (ALIGN) 4 MG CAPS Take 1 capsule by mouth daily.     . sennosides-docusate sodium (SENOKOT-S) 8.6-50 MG tablet Take 1 tablet by mouth daily as needed for constipation.    . triamcinolone (KENALOG) 0.1 % cream Apply 1 application topically. Apply topically to  affected area once daily     . gabapentin (NEURONTIN) 300 MG capsule Take 1 capsule (300 mg total) by mouth 3 (three) times daily. (Patient not taking: Reported on 09/05/2015) 20 capsule 0  . HYDROcodone-acetaminophen (NORCO) 10-325 MG per tablet Take 1 tablet by mouth 2 (two) times daily as needed. Reported on 09/05/2015  0  . HYDROcodone-homatropine (HYCODAN) 5-1.5 MG/5ML syrup Take 5 mLs by mouth every 8 (eight) hours as needed for cough. (Patient not taking: Reported on 07/28/2015) 120 mL 0  . meloxicam (MOBIC) 15 MG tablet Take 15 mg by mouth daily as needed. Reported on  09/05/2015     No current facility-administered medications for this encounter.    Physical Findings: The patient is in no acute distress. Patient is alert and oriented.  height is 5\' 2"  (1.575 m) and weight is 239 lb 8 oz (108.636 kg). Her temperature is 97.6 F (36.4 C). Her blood pressure is 142/90 and her pulse is 73. .    Neck  Without massess Heart RRR Chest CTAB Abdomen and pelvis is non-tender to palpation.   No groin adenopathy GYN exam- ext genitalia unremarkable.  Vaginal vault without evidence of disease on speculum and digital exam.  Pap smear was performed.  There was a mild amount of bleeding in the vaginal cuff during the speculum exam.  Lab Findings: Lab Results  Component Value Date   WBC 4.9 08/24/2015   HGB 11.7 08/24/2015   HCT 37.9 08/24/2015   MCV 68.3* 08/24/2015   PLT 235 08/24/2015    Radiographic Findings: No results found.  Impression/Plan: NED I gave the pt a card and she will follow up with me in 6 months  Continue seeing GYN onc PAP results are pending from today's exam Bleeding WNL; Call if bleeding continues >24 hours.  _____________________________________   Eppie Gibson, MD

## 2015-09-05 NOTE — Progress Notes (Signed)
Katelyn Fisher presents for follow up of radiation completed 01/03/2015 for endometrial cancer. She denies pain and any problems with urination. She reports she occasionally takes a stool softener because of constipation. She reports she is eating well. She also admits to some fatigue at times. She has no other concerns at this time.   BP 142/90 mmHg  Pulse 73  Temp(Src) 97.6 F (36.4 C)  Ht 5\' 2"  (1.575 m)  Wt 239 lb 8 oz (108.636 kg)  BMI 43.79 kg/m2   Wt Readings from Last 3 Encounters:  09/05/15 239 lb 8 oz (108.636 kg)  07/28/15 234 lb 5.6 oz (106.3 kg)  07/23/15 238 lb (107.956 kg)

## 2015-09-09 ENCOUNTER — Telehealth: Payer: Self-pay

## 2015-09-09 LAB — CYTOLOGY - PAP

## 2015-09-09 NOTE — Telephone Encounter (Signed)
I left a voice mail message for Katelyn Fisher regarding the results of her recent PAP smear in Dr. Pearlie Oyster office. I informed her that her results showed no evidence of cancer. I left my number for her to call back if she had any questions.

## 2015-09-12 ENCOUNTER — Other Ambulatory Visit: Payer: Commercial Managed Care - HMO

## 2015-09-13 ENCOUNTER — Ambulatory Visit: Payer: Commercial Managed Care - HMO | Admitting: Family Medicine

## 2015-09-17 ENCOUNTER — Other Ambulatory Visit: Payer: Self-pay | Admitting: Family Medicine

## 2015-09-20 ENCOUNTER — Other Ambulatory Visit (INDEPENDENT_AMBULATORY_CARE_PROVIDER_SITE_OTHER): Payer: Commercial Managed Care - HMO

## 2015-09-20 DIAGNOSIS — E079 Disorder of thyroid, unspecified: Secondary | ICD-10-CM

## 2015-09-20 DIAGNOSIS — K219 Gastro-esophageal reflux disease without esophagitis: Secondary | ICD-10-CM

## 2015-09-20 DIAGNOSIS — I10 Essential (primary) hypertension: Secondary | ICD-10-CM

## 2015-09-20 DIAGNOSIS — M545 Low back pain: Secondary | ICD-10-CM

## 2015-09-20 DIAGNOSIS — M79604 Pain in right leg: Secondary | ICD-10-CM

## 2015-09-20 DIAGNOSIS — E785 Hyperlipidemia, unspecified: Secondary | ICD-10-CM

## 2015-09-20 DIAGNOSIS — C541 Malignant neoplasm of endometrium: Secondary | ICD-10-CM | POA: Diagnosis not present

## 2015-09-20 DIAGNOSIS — E669 Obesity, unspecified: Secondary | ICD-10-CM

## 2015-09-20 DIAGNOSIS — E118 Type 2 diabetes mellitus with unspecified complications: Secondary | ICD-10-CM | POA: Diagnosis not present

## 2015-09-20 DIAGNOSIS — D649 Anemia, unspecified: Secondary | ICD-10-CM

## 2015-09-20 LAB — COMPREHENSIVE METABOLIC PANEL
ALT: 17 U/L (ref 0–35)
AST: 19 U/L (ref 0–37)
Albumin: 4 g/dL (ref 3.5–5.2)
Alkaline Phosphatase: 82 U/L (ref 39–117)
BILIRUBIN TOTAL: 0.3 mg/dL (ref 0.2–1.2)
BUN: 10 mg/dL (ref 6–23)
CALCIUM: 9.3 mg/dL (ref 8.4–10.5)
CO2: 26 meq/L (ref 19–32)
CREATININE: 0.67 mg/dL (ref 0.40–1.20)
Chloride: 105 mEq/L (ref 96–112)
GFR: 114.06 mL/min (ref >60.00)
Glucose, Bld: 110 mg/dL — ABNORMAL HIGH (ref 70–99)
Potassium: 3.8 mEq/L (ref 3.5–5.1)
Sodium: 140 mEq/L (ref 135–145)
Total Protein: 7 g/dL (ref 6.0–8.3)

## 2015-09-20 LAB — LIPID PANEL
CHOL/HDL RATIO: 3
CHOLESTEROL: 134 mg/dL (ref 0–200)
HDL: 50.2 mg/dL (ref >39.00)
LDL Cholesterol: 65 mg/dL (ref 0–99)
NonHDL: 83.35
TRIGLYCERIDES: 93 mg/dL (ref 0.0–149.0)
VLDL: 18.6 mg/dL (ref 0.0–40.0)

## 2015-09-20 LAB — CBC
HCT: 37.8 % (ref 36.0–46.0)
Hemoglobin: 11.9 g/dL — ABNORMAL LOW (ref 12.0–15.0)
MCHC: 31.6 g/dL (ref 30.0–36.0)
MCV: 66.5 fl — ABNORMAL LOW (ref 78.0–100.0)
Platelets: 221 10*3/uL (ref 150.0–400.0)
RBC: 5.68 Mil/uL — ABNORMAL HIGH (ref 3.87–5.11)
RDW: 17.2 % — AB (ref 11.5–15.5)
WBC: 5.2 10*3/uL (ref 4.0–10.5)

## 2015-09-20 LAB — TSH: TSH: 1.97 u[IU]/mL (ref 0.35–4.50)

## 2015-09-20 LAB — HEMOGLOBIN A1C: Hgb A1c MFr Bld: 6.8 % — ABNORMAL HIGH (ref 4.6–6.5)

## 2015-09-20 LAB — MICROALBUMIN / CREATININE URINE RATIO
CREATININE, U: 14.7 mg/dL
Microalb Creat Ratio: 4.7 mg/g (ref 0.0–30.0)
Microalb, Ur: <0.7 mg/dL (ref 0.0–1.9)

## 2015-09-21 ENCOUNTER — Encounter: Payer: Self-pay | Admitting: Podiatry

## 2015-09-21 ENCOUNTER — Telehealth: Payer: Self-pay

## 2015-09-21 ENCOUNTER — Ambulatory Visit (INDEPENDENT_AMBULATORY_CARE_PROVIDER_SITE_OTHER): Payer: Commercial Managed Care - HMO | Admitting: Podiatry

## 2015-09-21 ENCOUNTER — Other Ambulatory Visit: Payer: Self-pay | Admitting: Oncology

## 2015-09-21 DIAGNOSIS — G620 Drug-induced polyneuropathy: Secondary | ICD-10-CM | POA: Diagnosis not present

## 2015-09-21 DIAGNOSIS — T451X5A Adverse effect of antineoplastic and immunosuppressive drugs, initial encounter: Secondary | ICD-10-CM

## 2015-09-21 DIAGNOSIS — B351 Tinea unguium: Secondary | ICD-10-CM

## 2015-09-21 NOTE — Telephone Encounter (Signed)
Pt called asking about port flushes. I did explain they would have to be done at [facility]. Pt can discuss with Dr Marko Plume at some point if she wants PAC taken out. Also discussed that Dr Charlett Blake can follow her anemia. POF for 3 flushes sent.

## 2015-09-21 NOTE — Telephone Encounter (Signed)
Pt called stating she had labs drawn at PCP, Dr Charlett Blake. These are in epic chart. Pt wants to make sure they are the ones Dr Marko Plume wanted to see. Called pt back and states Dr Charlett Blake will be responsible for the lab results. I will let Dr Edwyna Shell know that they are available to be seen by her. Per Dr Edwyna Shell and Louise's telephone note of 2/1 Dr Marko Plume was interested in labs drawn 2/1 during Dr Lanell Persons visit.

## 2015-09-21 NOTE — Patient Instructions (Signed)
Diabetes and Foot Care Diabetes may cause you to have problems because of poor blood supply (circulation) to your feet and legs. This may cause the skin on your feet to become thinner, break easier, and heal more slowly. Your skin may become dry, and the skin may peel and crack. You may also have nerve damage in your legs and feet causing decreased feeling in them. You may not notice minor injuries to your feet that could lead to infections or more serious problems. Taking care of your feet is one of the most important things you can do for yourself.  HOME CARE INSTRUCTIONS  Wear shoes at all times, even in the house. Do not go barefoot. Bare feet are easily injured.  Check your feet daily for blisters, cuts, and redness. If you cannot see the bottom of your feet, use a mirror or ask someone for help.  Wash your feet with warm water (do not use hot water) and mild soap. Then pat your feet and the areas between your toes until they are completely dry. Do not soak your feet as this can dry your skin.  Apply a moisturizing lotion or petroleum jelly (that does not contain alcohol and is unscented) to the skin on your feet and to dry, brittle toenails. Do not apply lotion between your toes.  Trim your toenails straight across. Do not dig under them or around the cuticle. File the edges of your nails with an emery board or nail file.  Do not cut corns or calluses or try to remove them with medicine.  Wear clean socks or stockings every day. Make sure they are not too tight. Do not wear knee-high stockings since they may decrease blood flow to your legs.  Wear shoes that fit properly and have enough cushioning. To break in new shoes, wear them for just a few hours a day. This prevents you from injuring your feet. Always look in your shoes before you put them on to be sure there are no objects inside.  Do not cross your legs. This may decrease the blood flow to your feet.  If you find a minor scrape,  cut, or break in the skin on your feet, keep it and the skin around it clean and dry. These areas may be cleansed with mild soap and water. Do not cleanse the area with peroxide, alcohol, or iodine.  When you remove an adhesive bandage, be sure not to damage the skin around it.  If you have a wound, look at it several times a day to make sure it is healing.  Do not use heating pads or hot water bottles. They may burn your skin. If you have lost feeling in your feet or legs, you may not know it is happening until it is too late.  Make sure your health care provider performs a complete foot exam at least annually or more often if you have foot problems. Report any cuts, sores, or bruises to your health care provider immediately. SEEK MEDICAL CARE IF:   You have an injury that is not healing.  You have cuts or breaks in the skin.  You have an ingrown nail.  You notice redness on your legs or feet.  You feel burning or tingling in your legs or feet.  You have pain or cramps in your legs and feet.  Your legs or feet are numb.  Your feet always feel cold. SEEK IMMEDIATE MEDICAL CARE IF:   There is increasing redness,   swelling, or pain in or around a wound.  There is a red line that goes up your leg.  Pus is coming from a wound.  You develop a fever or as directed by your health care provider.  You notice a bad smell coming from an ulcer or wound.   This information is not intended to replace advice given to you by your health care provider. Make sure you discuss any questions you have with your health care provider.   Document Released: 07/06/2000 Document Revised: 03/11/2013 Document Reviewed: 12/16/2012 Elsevier Interactive Patient Education 2016 [employer]vier Inc.  

## 2015-09-22 ENCOUNTER — Telehealth: Payer: Self-pay | Admitting: Oncology

## 2015-09-22 NOTE — Telephone Encounter (Signed)
s.w. pt and advised on March thru July flush appts.Marland KitchenMarland KitchenMarland KitchenMarland Kitchenpt ok and aware

## 2015-09-22 NOTE — Progress Notes (Signed)
Patient ID: Katelyn Fisher, female   DOB: Dec 02, 1950, 65 y.o.   MRN: [id number]  Subjective: This patient presents again for a schedule visit requesting debridement of long thick toenails. 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

## 2015-09-27 ENCOUNTER — Ambulatory Visit (INDEPENDENT_AMBULATORY_CARE_PROVIDER_SITE_OTHER): Payer: Commercial Managed Care - HMO | Admitting: Family Medicine

## 2015-09-27 ENCOUNTER — Encounter: Payer: Self-pay | Admitting: Family Medicine

## 2015-09-27 VITALS — BP 119/93 | HR 95 | Temp 98.4°F | Ht 62.0 in | Wt 234.5 lb

## 2015-09-27 DIAGNOSIS — K219 Gastro-esophageal reflux disease without esophagitis: Secondary | ICD-10-CM

## 2015-09-27 DIAGNOSIS — Z6841 Body Mass Index (BMI) 40.0 and over, adult: Secondary | ICD-10-CM

## 2015-09-27 DIAGNOSIS — I10 Essential (primary) hypertension: Secondary | ICD-10-CM

## 2015-09-27 DIAGNOSIS — M179 Osteoarthritis of knee, unspecified: Secondary | ICD-10-CM | POA: Diagnosis not present

## 2015-09-27 DIAGNOSIS — E118 Type 2 diabetes mellitus with unspecified complications: Secondary | ICD-10-CM | POA: Diagnosis not present

## 2015-09-27 DIAGNOSIS — M1711 Unilateral primary osteoarthritis, right knee: Secondary | ICD-10-CM

## 2015-09-27 NOTE — Progress Notes (Signed)
Patient ID: Katelyn Fisher, female   DOB: 1951/07/10, 65 y.o.   MRN: [id number]   Subjective:    Patient ID: Katelyn Fisher, female    DOB: 09-17-1950, 65 y.o.   MRN: [id number]  Chief Complaint  Patient presents with  . Follow-up    HPI Patient is in today for follow up and preop clearance for right total knee replacement. She is struggling with worsening right knee pain that keeps her from exercising and she feels she must proceed with surgery so she can start exercising again. No recent illness. No other acute concerns. Denies CP/palp/SOB/HA/congestion/fevers/GI or GU c/o. Taking meds as prescribed   Past Medical History  Diagnosis Date  . Asthma   . Hypertension   . Obesity   . Hx of colonic polyps   . GERD (gastroesophageal reflux disease)   . History of colonic diverticulitis   . Arthritis     back  . Hyperlipidemia   . Depression   . Low back pain radiating to right leg 08/18/2012  . Anxiety   . Benign paroxysmal positional vertigo 11/01/2012  . Thyroid disease 06/01/2013  . Cervical cancer screening 10/09/2010    Qualifier: Diagnosis of  By: Wynona Luna   . 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  . Preventative health care 12/09/2013  . Family history of breast cancer   . Family history of ovarian cancer   . Family history of prostate cancer   . Family history of colon cancer   . Radiation 12/17/14-01/03/15    vaginal brachytherapy  . Cancer [facility])     endometrial    Past Surgical History  Procedure Laterality Date  . Cholecystectomy  1991  . Dilation and curettage of uterus    . Tubal ligation    . Appendectomy    . Abdominal hysterectomy  06/23/14    [facility], TRH/BSO    Family History  Problem Relation Age of Onset  . Heart disease Maternal Aunt     x 3 -2 brothers  . Hypertension Maternal Aunt   . Breast cancer Maternal Aunt     maternal half; dx in her 74s  . Ovarian cancer Maternal Aunt     dx in her 38s  . Prostate cancer Father 90  . Diabetes  Father   . Cancer Father     lung cancer, asbestos exposure  . Heart disease Brother   . Diabetes Brother   . Other      no FH colon cancer  . Diabetes Mother   . Lupus Mother   . Heart disease Brother   . Hyperlipidemia Sister   . Heart disease Brother   . Diabetes Brother   . Colon polyps Brother   . Heart disease Brother   . Diabetes Brother   . Colon polyps Brother   . Colon cancer Maternal Grandmother     dx in her 44s  . Leukemia Maternal Uncle 21  . Cancer Paternal Aunt     NOS  . Prostate cancer Paternal Uncle     dx in his 56s  . Breast cancer Other     dx in her 30s    Social History   Social History  . Marital Status: Married    Spouse Name: N/A  . Number of Children: N/A  . Years of Education: N/A   Occupational History  . Not on file.   Social History Main Topics  . Smoking status: Never Smoker   . Smokeless tobacco: Never Used  .  Alcohol Use: No  . Drug Use: No  . Sexual Activity:    Partners: Male     Comment: lives with husband, no dietary restrictions   G2 P 2   Other Topics Concern  . Not on file   Social History Narrative   Married- 67 years   Never Smoked   Alcohol use-no   Drug use-no   Occupation: housewife   Caffeine use/day:  None   Does Patient Exercise:  yes    Outpatient Prescriptions Prior to Visit  Medication Sig Dispense Refill  . albuterol (PROAIR HFA) 108 (90 BASE) MCG/ACT inhaler Inhale 2 puffs into the lungs 2 (two) times daily as needed. 3 Inhaler 2  . albuterol (PROVENTIL) (2.5 MG/3ML) 0.083% nebulizer solution Take 3 mLs (2.5 mg total) by nebulization every 6 (six) hours as needed for wheezing or shortness of breath. 150 mL 1  . amLODipine (NORVASC) 10 MG tablet Take 1 tablet (10 mg total) by mouth daily. 90 tablet 2  . aspirin 81 MG tablet Take 81 mg by mouth daily.      Marland Kitchen atorvastatin (LIPITOR) 20 MG tablet Take 0.5 tablets (10 mg total) by mouth daily. 90 tablet 1  . benazepril (LOTENSIN) 40 MG tablet Take 1  tablet (40 mg total) by mouth daily. 90 tablet 2  . budesonide-formoterol (SYMBICORT) 160-4.5 MCG/ACT inhaler Inhale 2 puffs into the lungs 2 (two) times daily. 3 Inhaler 2  . carvedilol (COREG) 12.5 MG tablet Take 1 tablet (12.5 mg total) by mouth 2 (two) times daily with a meal. 180 tablet 2  . chlorpheniramine-HYDROcodone (TUSSIONEX PENNKINETIC ER) 10-8 MG/5ML SUER Take 5 mLs by mouth every 12 (twelve) hours as needed for cough. 140 mL 0  . cyclobenzaprine (FLEXERIL) 10 MG tablet Take 1 tablet (10 mg total) by mouth 2 (two) times daily as needed for muscle spasms. 20 tablet 0  . diclofenac sodium (VOLTAREN) 1 % GEL Apply 2 g topically 4 (four) times daily as needed.     . diphenhydramine-acetaminophen (TYLENOL PM) 25-500 MG TABS Take 1 tablet by mouth at bedtime as needed.    . ferrous fumarate (HEMOCYTE - 106 MG FE) 325 (106 Fe) MG TABS tablet Take 1 tablet daily on an empty stomach with OJ. 30 each 3  . fluticasone (FLONASE) 50 MCG/ACT nasal spray Place 2 sprays into the nose daily. 16 g 0  . furosemide (LASIX) 20 MG tablet Take 1 tablet (20 mg total) by mouth daily. 90 tablet 0  . gabapentin (NEURONTIN) 300 MG capsule Take 1 capsule (300 mg total) by mouth 3 (three) times daily. 20 capsule 0  . HYDROcodone-acetaminophen (NORCO) 10-325 MG per tablet Take 1 tablet by mouth 2 (two) times daily as needed. Reported on 09/05/2015  0  . HYDROcodone-homatropine (HYCODAN) 5-1.5 MG/5ML syrup Take 5 mLs by mouth every 8 (eight) hours as needed for cough. 120 mL 0  . levothyroxine (SYNTHROID, LEVOTHROID) 75 MCG tablet TAKE 1 TABLET (75 MCG TOTAL) BY MOUTH DAILY BEFORE BREAKFAST. 30 tablet 3  . meloxicam (MOBIC) 15 MG tablet Take 15 mg by mouth daily as needed. Reported on 09/05/2015    . Multiple Vitamin (MULTIVITAMIN) tablet Take 1 tablet by mouth daily.    . pantoprazole (PROTONIX) 40 MG tablet Take 1 tablet (40 mg total) by mouth daily. 90 tablet 2  . Probiotic Product (ALIGN) 4 MG CAPS Take 1 capsule by  mouth daily.     . sennosides-docusate sodium (SENOKOT-S) 8.6-50 MG tablet Take 1  tablet by mouth daily as needed for constipation.    . triamcinolone (KENALOG) 0.1 % cream Apply 1 application topically. Apply topically to affected area once daily      No facility-administered medications prior to visit.    Allergies  Allergen Reactions  . Oxycodone Swelling    Facial swelling and tightness  . Penicillins Swelling    Rash with welps  . Shellfish Allergy Hives, Shortness Of Breath and Swelling  . Iodine     Rash  . Sulfa Antibiotics Rash    Review of Systems  Constitutional: Negative for fever, chills and malaise/fatigue.  HENT: Negative for congestion and hearing loss.   Eyes: Negative for discharge.  Respiratory: Negative for cough, sputum production and shortness of breath.   Cardiovascular: Negative for chest pain, palpitations and leg swelling.  Gastrointestinal: Negative for heartburn, nausea, vomiting, abdominal pain, diarrhea, constipation and blood in stool.  Genitourinary: Negative for dysuria, urgency, frequency and hematuria.  Musculoskeletal: Positive for back pain and joint pain. Negative for myalgias and falls.  Skin: Negative for rash.  Neurological: Negative for dizziness, sensory change, loss of consciousness, weakness and headaches.  Endo/Heme/Allergies: Negative for environmental allergies. Does not bruise/bleed easily.  Psychiatric/Behavioral: Negative for depression and suicidal ideas. The patient is not nervous/anxious and does not have insomnia.        Objective:    Physical Exam  Constitutional: She is oriented to person, place, and time. She appears well-developed and well-nourished. No distress.  HENT:  Head: Normocephalic and atraumatic.  Eyes: Conjunctivae are normal.  Neck: Neck supple. No thyromegaly present.  Cardiovascular: Normal rate, regular rhythm and normal heart sounds.   No murmur heard. Pulmonary/Chest: Effort normal and breath  sounds normal. No respiratory distress.  Abdominal: Soft. Bowel sounds are normal. She exhibits no distension and no mass. There is no tenderness.  Musculoskeletal: She exhibits no edema.  Lymphadenopathy:    She has no cervical adenopathy.  Neurological: She is alert and oriented to person, place, and time. No cranial nerve deficit.  Skin: Skin is warm and dry.  Psychiatric: She has a normal mood and affect. Her behavior is normal.    BP 119/93 mmHg  Pulse 95  Temp(Src) 98.4 F (36.9 C) (Oral)  Ht 5' 2"  (1.575 m)  Wt 234 lb 8 oz (106.369 kg)  BMI 42.88 kg/m2  SpO2 99% Wt Readings from Last 3 Encounters:  09/27/15 234 lb 8 oz (106.369 kg)  09/05/15 239 lb 8 oz (108.636 kg)  07/28/15 234 lb 5.6 oz (106.3 kg)     Lab Results  Component Value Date   WBC 5.2 09/20/2015   HGB 11.9* 09/20/2015   HCT 37.8 09/20/2015   PLT 221.0 09/20/2015   GLUCOSE 110* 09/20/2015   CHOL 134 09/20/2015   TRIG 93.0 09/20/2015   HDL 50.20 09/20/2015   LDLDIRECT 71.0 11/19/2014   LDLCALC 65 09/20/2015   ALT 17 09/20/2015   AST 19 09/20/2015   NA 140 09/20/2015   K 3.8 09/20/2015   CL 105 09/20/2015   CREATININE 0.67 09/20/2015   BUN 10 09/20/2015   CO2 26 09/20/2015   TSH 1.97 09/20/2015   INR 0.97 08/16/2014   HGBA1C 6.8* 09/20/2015   MICROALBUR <0.7 09/20/2015    Lab Results  Component Value Date   TSH 1.97 09/20/2015   Lab Results  Component Value Date   WBC 5.2 09/20/2015   HGB 11.9* 09/20/2015   HCT 37.8 09/20/2015   MCV 66.5 Repeated and verified  X2.* 09/20/2015   PLT 221.0 09/20/2015   Lab Results  Component Value Date   NA 140 09/20/2015   K 3.8 09/20/2015   CHLORIDE 103 08/24/2015   CO2 26 09/20/2015   GLUCOSE 110* 09/20/2015   BUN 10 09/20/2015   CREATININE 0.67 09/20/2015   BILITOT 0.3 09/20/2015   ALKPHOS 82 09/20/2015   AST 19 09/20/2015   ALT 17 09/20/2015   PROT 7.0 09/20/2015   ALBUMIN 4.0 09/20/2015   CALCIUM 9.3 09/20/2015   ANIONGAP 8 08/24/2015     EGFR 87* 08/24/2015   GFR 114.06 09/20/2015   Lab Results  Component Value Date   CHOL 134 09/20/2015   Lab Results  Component Value Date   HDL 50.20 09/20/2015   Lab Results  Component Value Date   LDLCALC 65 09/20/2015   Lab Results  Component Value Date   TRIG 93.0 09/20/2015   Lab Results  Component Value Date   CHOLHDL 3 09/20/2015   Lab Results  Component Value Date   HGBA1C 6.8* 09/20/2015       Assessment & Plan:   Problem List Items Addressed This Visit    Diabetes mellitus type 2, controlled ([place])    hgba1c acceptable, minimize simple carbs. Increase exercise as tolerated. Continue current meds      Essential hypertension    Well controlled, no changes to meds. Encouraged heart healthy diet such as the DASH diet and exercise as tolerated.       GERD    Avoid offending foods, start probiotics. Do not eat large meals in late evening and consider raising head of bed.       Morbid obesity with body mass index of 40.0-44.9 in adult [facility]) - Primary    Encouraged DASH diet, decrease po intake and increase exercise as tolerated. Needs 7-8 hours of sleep nightly. Avoid trans fats, eat small, frequent meals every 4-5 hours with lean proteins, complex carbs and healthy fats. Minimize simple carbs.      Osteoarthritis    Is anxious to proceed with Right TKR due to level of pain and debility, she is cleared medically for surgery unless her medical state changes. She is advised to avoid NSAIDS and OTC meds for a week prior to surgery         I am having Ms. Bost maintain her aspirin, triamcinolone cream, multivitamin, meloxicam, budesonide-formoterol, albuterol, fluticasone, ALIGN, diclofenac sodium, albuterol, pantoprazole, diphenhydramine-acetaminophen, furosemide, benazepril, amLODipine, sennosides-docusate sodium, HYDROcodone-homatropine, gabapentin, cyclobenzaprine, HYDROcodone-acetaminophen, carvedilol, atorvastatin, chlorpheniramine-HYDROcodone, ferrous  fumarate, and levothyroxine.  No orders of the defined types were placed in this encounter.     Penni Homans, MD

## 2015-09-27 NOTE — Progress Notes (Signed)
Pre visit review using our clinic review tool, if applicable. No additional management support is needed unless otherwise documented below in the visit note. 

## 2015-09-27 NOTE — Patient Instructions (Signed)

## 2015-10-03 NOTE — Assessment & Plan Note (Signed)
Avoid offending foods, start probiotics. Do not eat large meals in late evening and consider raising head of bed.  

## 2015-10-03 NOTE — Assessment & Plan Note (Signed)
Encouraged DASH diet, decrease po intake and increase exercise as tolerated. Needs 7-8 hours of sleep nightly. Avoid trans fats, eat small, frequent meals every 4-5 hours with lean proteins, complex carbs and healthy fats. Minimize simple carbs 

## 2015-10-03 NOTE — Assessment & Plan Note (Signed)
hgba1c acceptable, minimize simple carbs. Increase exercise as tolerated. Continue current meds 

## 2015-10-03 NOTE — Assessment & Plan Note (Signed)
Is anxious to proceed with Right TKR due to level of pain and debility, she is cleared medically for surgery unless her medical state changes. She is advised to avoid NSAIDS and OTC meds for a week prior to surgery

## 2015-10-03 NOTE — Assessment & Plan Note (Signed)
Well controlled, no changes to meds. Encouraged heart healthy diet such as the DASH diet and exercise as tolerated.  °

## 2015-10-05 ENCOUNTER — Other Ambulatory Visit: Payer: Self-pay | Admitting: Family Medicine

## 2015-10-05 ENCOUNTER — Ambulatory Visit (HOSPITAL_BASED_OUTPATIENT_CLINIC_OR_DEPARTMENT_OTHER): Payer: Commercial Managed Care - HMO

## 2015-10-05 VITALS — BP 129/80 | HR 85 | Temp 98.1°F | Resp 17

## 2015-10-05 DIAGNOSIS — Z452 Encounter for adjustment and management of vascular access device: Secondary | ICD-10-CM

## 2015-10-05 DIAGNOSIS — Z95828 Presence of other vascular implants and grafts: Secondary | ICD-10-CM

## 2015-10-05 DIAGNOSIS — C541 Malignant neoplasm of endometrium: Secondary | ICD-10-CM

## 2015-10-05 MED ORDER — SODIUM CHLORIDE 0.9% FLUSH
10.0000 mL | INTRAVENOUS | Status: DC | PRN
  Administered 2015-10-05: 10 mL via INTRAVENOUS
  Filled 2015-10-05: qty 10

## 2015-10-05 MED ORDER — HEPARIN SOD (PORK) LOCK FLUSH 100 UNIT/ML IV SOLN
500.0000 [IU] | Freq: Once | INTRAVENOUS | Status: AC
  Administered 2015-10-05: 500 [IU] via INTRAVENOUS
  Filled 2015-10-05: qty 5

## 2015-10-05 NOTE — Patient Instructions (Signed)

## 2015-10-07 ENCOUNTER — Other Ambulatory Visit: Payer: Self-pay

## 2015-10-07 DIAGNOSIS — Z1231 Encounter for screening mammogram for malignant neoplasm of breast: Secondary | ICD-10-CM

## 2015-10-11 ENCOUNTER — Other Ambulatory Visit: Payer: Self-pay | Admitting: Family Medicine

## 2015-11-10 ENCOUNTER — Ambulatory Visit
Admission: RE | Admit: 2015-11-10 | Discharge: 2015-11-10 | Disposition: A | Discharge status: Home / Self Care | Payer: Commercial Managed Care - HMO | Source: Ambulatory Visit

## 2015-11-10 DIAGNOSIS — Z1231 Encounter for screening mammogram for malignant neoplasm of breast: Secondary | ICD-10-CM

## 2015-11-15 ENCOUNTER — Other Ambulatory Visit: Payer: Self-pay

## 2015-11-15 DIAGNOSIS — Z95828 Presence of other vascular implants and grafts: Secondary | ICD-10-CM

## 2015-11-15 HISTORY — DX: Presence of other vascular implants and grafts: Z95.828

## 2015-11-16 ENCOUNTER — Ambulatory Visit (HOSPITAL_BASED_OUTPATIENT_CLINIC_OR_DEPARTMENT_OTHER): Payer: Commercial Managed Care - HMO

## 2015-11-16 DIAGNOSIS — C541 Malignant neoplasm of endometrium: Secondary | ICD-10-CM | POA: Diagnosis not present

## 2015-11-16 DIAGNOSIS — Z452 Encounter for adjustment and management of vascular access device: Secondary | ICD-10-CM | POA: Diagnosis not present

## 2015-11-16 DIAGNOSIS — Z95828 Presence of other vascular implants and grafts: Secondary | ICD-10-CM

## 2015-11-16 MED ORDER — HEPARIN SOD (PORK) LOCK FLUSH 100 UNIT/ML IV SOLN
500.0000 [IU] | Freq: Once | INTRAVENOUS | Status: AC | PRN
  Administered 2015-11-16: 500 [IU] via INTRAVENOUS
  Filled 2015-11-16: qty 5

## 2015-11-16 MED ORDER — SODIUM CHLORIDE 0.9 % IJ SOLN
10.0000 mL | INTRAMUSCULAR | Status: DC | PRN
  Administered 2015-11-16: 10 mL via INTRAVENOUS
  Filled 2015-11-16: qty 10

## 2015-11-16 NOTE — Patient Instructions (Signed)

## 2015-12-13 ENCOUNTER — Other Ambulatory Visit: Payer: Self-pay | Admitting: Family Medicine

## 2015-12-21 ENCOUNTER — Encounter: Payer: Self-pay | Admitting: Family Medicine

## 2015-12-21 ENCOUNTER — Other Ambulatory Visit: Payer: Commercial Managed Care - HMO

## 2015-12-21 ENCOUNTER — Telehealth: Payer: Self-pay | Admitting: *Deleted

## 2015-12-21 NOTE — Telephone Encounter (Signed)
Patient had lab appointment today 12/21/15 @ 0900 with no lab orders or no notes in chart. Patient was upset, I explained to the patient Im unable to draw any labs at this time due to no orders in her chart and PCP currently out of office . I apologized for the inconvenience and delay in patient care . Staff will notify her when orders are put in. Patient verbalized understanding and agrees.

## 2015-12-21 NOTE — Telephone Encounter (Signed)
I do not see any orders for her to come back prior to her appt. For me it does not matter if she does the labs at or prior to the appt. It is up to her. If she wants to come back we can run them if not will do at visit. Hgba1c, cbc, tsh, cmp, microalb, lipid, for HTN, hyperlipidemia, DM 2 obesity

## 2015-12-22 NOTE — Telephone Encounter (Signed)
Patient sent mychart message to PCP regarding this and did forward to PCP to address.

## 2015-12-26 NOTE — Telephone Encounter (Signed)
Called to follow up with patient.  No answer.  Left a message for call back.  Pt has an appt scheduled with Dr. Charlett Blake on 12/29/15.

## 2015-12-28 ENCOUNTER — Encounter: Payer: Self-pay | Admitting: Podiatry

## 2015-12-28 ENCOUNTER — Ambulatory Visit (INDEPENDENT_AMBULATORY_CARE_PROVIDER_SITE_OTHER): Payer: Commercial Managed Care - HMO | Admitting: Podiatry

## 2015-12-28 ENCOUNTER — Ambulatory Visit (HOSPITAL_BASED_OUTPATIENT_CLINIC_OR_DEPARTMENT_OTHER): Payer: Commercial Managed Care - HMO

## 2015-12-28 ENCOUNTER — Telehealth: Payer: Self-pay

## 2015-12-28 VITALS — BP 133/83 | HR 84 | Temp 98.1°F | Resp 16

## 2015-12-28 DIAGNOSIS — M79676 Pain in unspecified toe(s): Secondary | ICD-10-CM | POA: Diagnosis not present

## 2015-12-28 DIAGNOSIS — C541 Malignant neoplasm of endometrium: Secondary | ICD-10-CM

## 2015-12-28 DIAGNOSIS — Z452 Encounter for adjustment and management of vascular access device: Secondary | ICD-10-CM | POA: Diagnosis not present

## 2015-12-28 DIAGNOSIS — B351 Tinea unguium: Secondary | ICD-10-CM

## 2015-12-28 DIAGNOSIS — G620 Drug-induced polyneuropathy: Secondary | ICD-10-CM

## 2015-12-28 DIAGNOSIS — T451X5A Adverse effect of antineoplastic and immunosuppressive drugs, initial encounter: Secondary | ICD-10-CM

## 2015-12-28 DIAGNOSIS — Z95828 Presence of other vascular implants and grafts: Secondary | ICD-10-CM

## 2015-12-28 MED ORDER — HEPARIN SOD (PORK) LOCK FLUSH 100 UNIT/ML IV SOLN
500.0000 [IU] | Freq: Once | INTRAVENOUS | Status: AC | PRN
  Administered 2015-12-28: 500 [IU] via INTRAVENOUS
  Filled 2015-12-28: qty 5

## 2015-12-28 MED ORDER — SODIUM CHLORIDE 0.9 % IJ SOLN
10.0000 mL | INTRAMUSCULAR | Status: DC | PRN
  Administered 2015-12-28: 10 mL via INTRAVENOUS
  Filled 2015-12-28: qty 10

## 2015-12-28 NOTE — Patient Instructions (Signed)

## 2015-12-28 NOTE — Telephone Encounter (Signed)
Pt asked to see RN after port flush. She wanted her surgical scar looked at. The small incision site just below her umbilicus had changed a little. The scar was intact, slightly thickened and darkened. No redness, no heat, not flaky. Pt denied pain. Instructed her if it gets red, or painful, swollen or flaky to call us.   Pt also asked if or when follow up scans or lab work would be done. She is not seeing Dr Marko Plume d/t her multiple other MD visits. Last CA 125 was 10/2014, last CT abd 02/2015

## 2015-12-28 NOTE — Telephone Encounter (Signed)
lvm to expect a call from scheduler for July appt

## 2015-12-28 NOTE — Patient Instructions (Signed)
Diabetes and Foot Care Diabetes may cause you to have problems because of poor blood supply (circulation) to your feet and legs. This may cause the skin on your feet to become thinner, break easier, and heal more slowly. Your skin may become dry, and the skin may peel and crack. You may also have nerve damage in your legs and feet causing decreased feeling in them. You may not notice minor injuries to your feet that could lead to infections or more serious problems. Taking care of your feet is one of the most important things you can do for yourself.  HOME CARE INSTRUCTIONS  Wear shoes at all times, even in the house. Do not go barefoot. Bare feet are easily injured.  Check your feet daily for blisters, cuts, and redness. If you cannot see the bottom of your feet, use a mirror or ask someone for help.  Wash your feet with warm water (do not use hot water) and mild soap. Then pat your feet and the areas between your toes until they are completely dry. Do not soak your feet as this can dry your skin.  Apply a moisturizing lotion or petroleum jelly (that does not contain alcohol and is unscented) to the skin on your feet and to dry, brittle toenails. Do not apply lotion between your toes.  Trim your toenails straight across. Do not dig under them or around the cuticle. File the edges of your nails with an emery board or nail file.  Do not cut corns or calluses or try to remove them with medicine.  Wear clean socks or stockings every day. Make sure they are not too tight. Do not wear knee-high stockings since they may decrease blood flow to your legs.  Wear shoes that fit properly and have enough cushioning. To break in new shoes, wear them for just a few hours a day. This prevents you from injuring your feet. Always look in your shoes before you put them on to be sure there are no objects inside.  Do not cross your legs. This may decrease the blood flow to your feet.  If you find a minor scrape,  cut, or break in the skin on your feet, keep it and the skin around it clean and dry. These areas may be cleansed with mild soap and water. Do not cleanse the area with peroxide, alcohol, or iodine.  When you remove an adhesive bandage, be sure not to damage the skin around it.  If you have a wound, look at it several times a day to make sure it is healing.  Do not use heating pads or hot water bottles. They may burn your skin. If you have lost feeling in your feet or legs, you may not know it is happening until it is too late.  Make sure your health care provider performs a complete foot exam at least annually or more often if you have foot problems. Report any cuts, sores, or bruises to your health care provider immediately. SEEK MEDICAL CARE IF:   You have an injury that is not healing.  You have cuts or breaks in the skin.  You have an ingrown nail.  You notice redness on your legs or feet.  You feel burning or tingling in your legs or feet.  You have pain or cramps in your legs and feet.  Your legs or feet are numb.  Your feet always feel cold. SEEK IMMEDIATE MEDICAL CARE IF:   There is increasing redness,   swelling, or pain in or around a wound.  There is a red line that goes up your leg.  Pus is coming from a wound.  You develop a fever or as directed by your health care provider.  You notice a bad smell coming from an ulcer or wound.   This information is not intended to replace advice given to you by your health care provider. Make sure you discuss any questions you have with your health care provider.   Document Released: 07/06/2000 Document Revised: 03/11/2013 Document Reviewed: 12/16/2012 Elsevier Interactive Patient Education 2016 [employer]vier Inc.  

## 2015-12-28 NOTE — Telephone Encounter (Signed)
Please let me see her early July with lab from [facility]

## 2015-12-29 ENCOUNTER — Encounter: Payer: Self-pay | Admitting: Family Medicine

## 2015-12-29 ENCOUNTER — Ambulatory Visit (INDEPENDENT_AMBULATORY_CARE_PROVIDER_SITE_OTHER): Payer: Commercial Managed Care - HMO | Admitting: Family Medicine

## 2015-12-29 VITALS — BP 120/80 | HR 85 | Temp 94.0°F | Ht 62.0 in | Wt 238.5 lb

## 2015-12-29 DIAGNOSIS — M1711 Unilateral primary osteoarthritis, right knee: Secondary | ICD-10-CM

## 2015-12-29 DIAGNOSIS — D649 Anemia, unspecified: Secondary | ICD-10-CM

## 2015-12-29 DIAGNOSIS — I10 Essential (primary) hypertension: Secondary | ICD-10-CM

## 2015-12-29 DIAGNOSIS — Z6841 Body Mass Index (BMI) 40.0 and over, adult: Secondary | ICD-10-CM

## 2015-12-29 DIAGNOSIS — E079 Disorder of thyroid, unspecified: Secondary | ICD-10-CM

## 2015-12-29 DIAGNOSIS — E785 Hyperlipidemia, unspecified: Secondary | ICD-10-CM

## 2015-12-29 DIAGNOSIS — J4520 Mild intermittent asthma, uncomplicated: Secondary | ICD-10-CM | POA: Diagnosis not present

## 2015-12-29 DIAGNOSIS — K219 Gastro-esophageal reflux disease without esophagitis: Secondary | ICD-10-CM

## 2015-12-29 DIAGNOSIS — E118 Type 2 diabetes mellitus with unspecified complications: Secondary | ICD-10-CM

## 2015-12-29 DIAGNOSIS — G4733 Obstructive sleep apnea (adult) (pediatric): Secondary | ICD-10-CM | POA: Diagnosis not present

## 2015-12-29 DIAGNOSIS — M179 Osteoarthritis of knee, unspecified: Secondary | ICD-10-CM

## 2015-12-29 LAB — LIPID PANEL
CHOLESTEROL: 165 mg/dL (ref 0–200)
HDL: 56.9 mg/dL (ref >39.00)
LDL CALC: 89 mg/dL (ref 0–99)
NonHDL: 108.5
Total CHOL/HDL Ratio: 3
Triglycerides: 96 mg/dL (ref 0.0–149.0)
VLDL: 19.2 mg/dL (ref 0.0–40.0)

## 2015-12-29 LAB — COMPREHENSIVE METABOLIC PANEL
ALT: 18 U/L (ref 0–35)
AST: 16 U/L (ref 0–37)
Albumin: 4 g/dL (ref 3.5–5.2)
Alkaline Phosphatase: 104 U/L (ref 39–117)
BUN: 9 mg/dL (ref 6–23)
CHLORIDE: 105 meq/L (ref 96–112)
CO2: 30 meq/L (ref 19–32)
CREATININE: 0.66 mg/dL (ref 0.40–1.20)
Calcium: 9.5 mg/dL (ref 8.4–10.5)
GFR: 115.95 mL/min (ref >60.00)
Glucose, Bld: 114 mg/dL — ABNORMAL HIGH (ref 70–99)
Potassium: 3.8 mEq/L (ref 3.5–5.1)
SODIUM: 142 meq/L (ref 135–145)
Total Bilirubin: 0.3 mg/dL (ref 0.2–1.2)
Total Protein: 7.2 g/dL (ref 6.0–8.3)

## 2015-12-29 LAB — CBC
HEMATOCRIT: 38.6 % (ref 36.0–46.0)
HEMOGLOBIN: 12 g/dL (ref 12.0–15.0)
MCHC: 31.1 g/dL (ref 30.0–36.0)
MCV: 66.6 fl — ABNORMAL LOW (ref 78.0–100.0)
Platelets: 243 10*3/uL (ref 150.0–400.0)
RBC: 5.8 Mil/uL — ABNORMAL HIGH (ref 3.87–5.11)
RDW: 17.3 % — AB (ref 11.5–15.5)
WBC: 5.2 10*3/uL (ref 4.0–10.5)

## 2015-12-29 LAB — TSH: TSH: 1.84 u[IU]/mL (ref 0.35–4.50)

## 2015-12-29 LAB — MICROALBUMIN / CREATININE URINE RATIO
Creatinine,U: 31.5 mg/dL
MICROALB/CREAT RATIO: 2.2 mg/g (ref 0.0–30.0)

## 2015-12-29 LAB — HEMOGLOBIN A1C: Hgb A1c MFr Bld: 6.6 % — ABNORMAL HIGH (ref 4.6–6.5)

## 2015-12-29 MED ORDER — FUROSEMIDE 20 MG PO TABS: 20.0000 mg | ORAL_TABLET | Freq: Every day | ORAL | Status: DC | PRN

## 2015-12-29 NOTE — Telephone Encounter (Signed)
lvm about 7/10 appt with lab/flush/[place]

## 2015-12-29 NOTE — Assessment & Plan Note (Signed)
Encouraged DASH diet, decrease po intake and increase exercise as tolerated. Needs 7-8 hours of sleep nightly. Avoid trans fats, eat small, frequent meals every 4-5 hours with lean proteins, complex carbs and healthy fats. Minimize simple carbs 

## 2015-12-29 NOTE — Progress Notes (Signed)
Patient ID: Katelyn Fisher, female   DOB: Sep 04, 1950, 65 y.o.   MRN: [id number]   Subjective:    Patient ID: Katelyn Fisher, female    DOB: 07/29/1950, 65 y.o.   MRN: [id number]  Chief Complaint  Patient presents with  . Follow-up    HPI Patient is in today for follow up. No recent illness or hospitalization. Is struggling with increased right knee pain and is following closely with Dr Nelva Bush of pain management. She has been under a great deal of stress due to 4 recent deaths in the family. She is noting anhedonia but  No suicidal ideation. Denies CP/palp/SOB/HA/congestion/fevers/GI or GU c/o. Taking meds as prescribed  Past Medical History  Diagnosis Date  . Asthma   . Hypertension   . Obesity   . Hx of colonic polyps   . GERD (gastroesophageal reflux disease)   . History of colonic diverticulitis   . Arthritis     back  . Hyperlipidemia   . Depression   . Low back pain radiating to right leg 08/18/2012  . Anxiety   . Benign paroxysmal positional vertigo 11/01/2012  . Thyroid disease 06/01/2013  . Cervical cancer screening 10/09/2010    Qualifier: Diagnosis of  By: Wynona Luna   . 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  . Preventative health care 12/09/2013  . Family history of breast cancer   . Family history of ovarian cancer   . Family history of prostate cancer   . Family history of colon cancer   . Radiation 12/17/14-01/03/15    vaginal brachytherapy  . Cancer [facility])     endometrial    Past Surgical History  Procedure Laterality Date  . Cholecystectomy  1991  . Dilation and curettage of uterus    . Tubal ligation    . Appendectomy    . Abdominal hysterectomy  06/23/14    [facility], TRH/BSO    Family History  Problem Relation Age of Onset  . Heart disease Maternal Aunt     x 3 -2 brothers  . Hypertension Maternal Aunt   . Breast cancer Maternal Aunt     maternal half; dx in her 38s  . Ovarian cancer Maternal Aunt     dx in her 60s  . Prostate cancer Father 3    . Diabetes Father   . Cancer Father     lung cancer, asbestos exposure  . Heart disease Brother   . Diabetes Brother   . Other      no FH colon cancer  . Diabetes Mother   . Lupus Mother   . Heart disease Brother   . Hyperlipidemia Sister   . Heart disease Brother   . Diabetes Brother   . Colon polyps Brother   . Heart disease Brother   . Diabetes Brother   . Colon polyps Brother   . Colon cancer Maternal Grandmother     dx in her 25s  . Leukemia Maternal Uncle 21  . Cancer Paternal Aunt     NOS  . Prostate cancer Paternal Uncle     dx in his 24s  . Breast cancer Other     dx in her 45s    Social History   Social History  . Marital Status: Married    Spouse Name: N/A  . Number of Children: N/A  . Years of Education: N/A   Occupational History  . Not on file.   Social History Main Topics  . Smoking status: Never Smoker   .  Smokeless tobacco: Never Used  . Alcohol Use: No  . Drug Use: No  . Sexual Activity:    Partners: Male     Comment: lives with husband, no dietary restrictions   G2 P 2   Other Topics Concern  . Not on file   Social History Narrative   Married- 17 years   Never Smoked   Alcohol use-no   Drug use-no   Occupation: housewife   Caffeine use/day:  None   Does Patient Exercise:  yes    Outpatient Prescriptions Prior to Visit  Medication Sig Dispense Refill  . albuterol (PROAIR HFA) 108 (90 BASE) MCG/ACT inhaler Inhale 2 puffs into the lungs 2 (two) times daily as needed. 3 Inhaler 2  . albuterol (PROVENTIL) (2.5 MG/3ML) 0.083% nebulizer solution Take 3 mLs (2.5 mg total) by nebulization every 6 (six) hours as needed for wheezing or shortness of breath. 150 mL 1  . amLODipine (NORVASC) 10 MG tablet TAKE 1 TABLET EVERY DAY 90 tablet 2  . aspirin 81 MG tablet Take 81 mg by mouth daily.      Marland Kitchen atorvastatin (LIPITOR) 20 MG tablet TAKE 0.5 TABLETS (10 MG TOTAL) BY MOUTH DAILY. 90 tablet 0  . benazepril (LOTENSIN) 40 MG tablet TAKE 1 TABLET  EVERY DAY 90 tablet 2  . budesonide-formoterol (SYMBICORT) 160-4.5 MCG/ACT inhaler Inhale 2 puffs into the lungs 2 (two) times daily. 3 Inhaler 2  . carvedilol (COREG) 12.5 MG tablet Take 1 tablet (12.5 mg total) by mouth 2 (two) times daily with a meal. 180 tablet 2  . chlorpheniramine-HYDROcodone (TUSSIONEX PENNKINETIC ER) 10-8 MG/5ML SUER Take 5 mLs by mouth every 12 (twelve) hours as needed for cough. 140 mL 0  . diclofenac sodium (VOLTAREN) 1 % GEL Apply 2 g topically 4 (four) times daily as needed.     . diphenhydramine-acetaminophen (TYLENOL PM) 25-500 MG TABS Take 1 tablet by mouth at bedtime as needed.    . ferrous fumarate (HEMOCYTE - 106 MG FE) 325 (106 Fe) MG TABS tablet Take 1 tablet daily on an empty stomach with OJ. 30 each 3  . fluticasone (FLONASE) 50 MCG/ACT nasal spray Place 2 sprays into the nose daily. 16 g 0  . gabapentin (NEURONTIN) 300 MG capsule Take 1 capsule (300 mg total) by mouth 3 (three) times daily. 20 capsule 0  . HYDROcodone-acetaminophen (NORCO) 10-325 MG per tablet Take 1 tablet by mouth 2 (two) times daily as needed. Reported on 09/05/2015  0  . levothyroxine (SYNTHROID, LEVOTHROID) 75 MCG tablet TAKE 1 TABLET (75 MCG TOTAL) BY MOUTH DAILY BEFORE BREAKFAST. 30 tablet 3  . Multiple Vitamin (MULTIVITAMIN) tablet Take 1 tablet by mouth daily.    . pantoprazole (PROTONIX) 40 MG tablet Take 1 tablet (40 mg total) by mouth daily. 90 tablet 2  . Probiotic Product (ALIGN) 4 MG CAPS Take 1 capsule by mouth daily.     . sennosides-docusate sodium (SENOKOT-S) 8.6-50 MG tablet Take 1 tablet by mouth daily as needed for constipation.    . triamcinolone (KENALOG) 0.1 % cream Apply 1 application topically. Apply topically to affected area once daily     . furosemide (LASIX) 20 MG tablet TAKE 1 TABLET EVERY DAY 90 tablet 1  . cyclobenzaprine (FLEXERIL) 10 MG tablet Take 1 tablet (10 mg total) by mouth 2 (two) times daily as needed for muscle spasms. 20 tablet 0  .  HYDROcodone-homatropine (HYCODAN) 5-1.5 MG/5ML syrup Take 5 mLs by mouth every 8 (eight) hours as needed  for cough. 120 mL 0  . meloxicam (MOBIC) 15 MG tablet Take 15 mg by mouth daily as needed. Reported on 09/05/2015     No facility-administered medications prior to visit.    Allergies  Allergen Reactions  . Oxycodone Swelling    Facial swelling and tightness  . Penicillins Swelling    Rash with welps  . Shellfish Allergy Hives, Shortness Of Breath and Swelling  . Iodine     Rash  . Sulfa Antibiotics Rash    Review of Systems  Constitutional: Negative for fever and malaise/fatigue.  HENT: Negative for congestion.   Eyes: Negative for blurred vision.  Respiratory: Negative for shortness of breath.   Cardiovascular: Positive for leg swelling. Negative for chest pain and palpitations.  Gastrointestinal: Negative for nausea, abdominal pain and blood in stool.  Genitourinary: Negative for dysuria and frequency.  Musculoskeletal: Positive for joint pain. Negative for falls and neck pain.  Skin: Negative for rash.  Neurological: Negative for dizziness, loss of consciousness and headaches.  Endo/Heme/Allergies: Negative for environmental allergies.  Psychiatric/Behavioral: Negative for depression. The patient is not nervous/anxious.        Objective:    Physical Exam  Constitutional: She is oriented to person, place, and time. She appears well-developed and well-nourished. No distress.  HENT:  Head: Normocephalic and atraumatic.  Nose: Nose normal.  Eyes: Right eye exhibits no discharge. Left eye exhibits no discharge.  Neck: Normal range of motion. Neck supple.  Cardiovascular: Normal rate and regular rhythm.   No murmur heard. Pulmonary/Chest: Effort normal and breath sounds normal.  Abdominal: Soft. Bowel sounds are normal. There is no tenderness.  Musculoskeletal: She exhibits no edema.  Neurological: She is alert and oriented to person, place, and time.  Skin: Skin is  warm and dry.  Psychiatric: She has a normal mood and affect.  Nursing note and vitals reviewed.   BP 120/80 mmHg  Pulse 85  Temp(Src) 94 F (34.4 C) (Oral)  Ht 5' 2"  (1.575 m)  Wt 238 lb 8 oz (108.183 kg)  BMI 43.61 kg/m2  SpO2 94% Wt Readings from Last 3 Encounters:  12/29/15 238 lb 8 oz (108.183 kg)  09/27/15 234 lb 8 oz (106.369 kg)  09/05/15 239 lb 8 oz (108.636 kg)     Lab Results  Component Value Date   WBC 5.2 12/29/2015   HGB 12.0 12/29/2015   HCT 38.6 12/29/2015   PLT 243.0 12/29/2015   GLUCOSE 114* 12/29/2015   CHOL 165 12/29/2015   TRIG 96.0 12/29/2015   HDL 56.90 12/29/2015   LDLDIRECT 71.0 11/19/2014   LDLCALC 89 12/29/2015   ALT 18 12/29/2015   AST 16 12/29/2015   NA 142 12/29/2015   K 3.8 12/29/2015   CL 105 12/29/2015   CREATININE 0.66 12/29/2015   BUN 9 12/29/2015   CO2 30 12/29/2015   TSH 1.84 12/29/2015   INR 0.97 08/16/2014   HGBA1C 6.6* 12/29/2015   MICROALBUR <0.7 12/29/2015    Lab Results  Component Value Date   TSH 1.84 12/29/2015   Lab Results  Component Value Date   WBC 5.2 12/29/2015   HGB 12.0 12/29/2015   HCT 38.6 12/29/2015   MCV 66.6 Repeated and verified X2.* 12/29/2015   PLT 243.0 12/29/2015   Lab Results  Component Value Date   NA 142 12/29/2015   K 3.8 12/29/2015   CHLORIDE 103 08/24/2015   CO2 30 12/29/2015   GLUCOSE 114* 12/29/2015   BUN 9 12/29/2015   CREATININE  0.66 12/29/2015   BILITOT 0.3 12/29/2015   ALKPHOS 104 12/29/2015   AST 16 12/29/2015   ALT 18 12/29/2015   PROT 7.2 12/29/2015   ALBUMIN 4.0 12/29/2015   CALCIUM 9.5 12/29/2015   ANIONGAP 8 08/24/2015   EGFR 87* 08/24/2015   GFR 115.95 12/29/2015   Lab Results  Component Value Date   CHOL 165 12/29/2015   Lab Results  Component Value Date   HDL 56.90 12/29/2015   Lab Results  Component Value Date   LDLCALC 89 12/29/2015   Lab Results  Component Value Date   TRIG 96.0 12/29/2015   Lab Results  Component Value Date   CHOLHDL 3  12/29/2015   Lab Results  Component Value Date   HGBA1C 6.6* 12/29/2015       Assessment & Plan:   Problem List Items Addressed This Visit    Thyroid disease    On Levothyroxine, continue to monitor      Relevant Orders   Hemoglobin A1c (Completed)   Comprehensive metabolic panel (Completed)   Lipid panel (Completed)   TSH (Completed)   CBC (Completed)   Microalbumin / creatinine urine ratio (Completed)   TSH   CBC   Lipid panel   Comprehensive metabolic panel   Hemoglobin A1c   Osteoarthritis    Right knee flared recently following with pain management Dr Nelva Bush      Obstructive sleep apnea   Relevant Orders   Hemoglobin A1c (Completed)   Comprehensive metabolic panel (Completed)   Lipid panel (Completed)   TSH (Completed)   CBC (Completed)   Microalbumin / creatinine urine ratio (Completed)   TSH   CBC   Lipid panel   Comprehensive metabolic panel   Hemoglobin A1c   Morbid obesity with body mass index of 40.0-44.9 in adult ([place])    Encouraged DASH diet, decrease po intake and increase exercise as tolerated. Needs 7-8 hours of sleep nightly. Avoid trans fats, eat small, frequent meals every 4-5 hours with lean proteins, complex carbs and healthy fats. Minimize simple carbs.      Relevant Orders   Hemoglobin A1c (Completed)   Comprehensive metabolic panel (Completed)   Lipid panel (Completed)   TSH (Completed)   CBC (Completed)   Microalbumin / creatinine urine ratio (Completed)   TSH   CBC   Lipid panel   Comprehensive metabolic panel   Hemoglobin A1c   Hyperlipidemia    Encouraged heart healthy diet, increase exercise, avoid trans fats, consider a krill oil cap daily. Tolerating Atorvastatin      Relevant Medications   furosemide (LASIX) 20 MG tablet   Other Relevant Orders   Hemoglobin A1c (Completed)   Comprehensive metabolic panel (Completed)   Lipid panel (Completed)   TSH (Completed)   CBC (Completed)   Microalbumin / creatinine urine  ratio (Completed)   TSH   CBC   Lipid panel   Comprehensive metabolic panel   Hemoglobin A1c   GERD   Relevant Orders   Hemoglobin A1c (Completed)   Comprehensive metabolic panel (Completed)   Lipid panel (Completed)   TSH (Completed)   CBC (Completed)   Microalbumin / creatinine urine ratio (Completed)   TSH   CBC   Lipid panel   Comprehensive metabolic panel   Hemoglobin A1c   Essential hypertension - Primary   Relevant Medications   furosemide (LASIX) 20 MG tablet   Other Relevant Orders   Hemoglobin A1c (Completed)   Comprehensive metabolic panel (Completed)   Lipid panel (Completed)  TSH (Completed)   CBC (Completed)   Microalbumin / creatinine urine ratio (Completed)   TSH   CBC   Lipid panel   Comprehensive metabolic panel   Hemoglobin A1c   Diabetes mellitus type 2, controlled (HCC)    hgba1c acceptable, minimize simple carbs. Increase exercise as tolerated. Continue current meds      Relevant Orders   Hemoglobin A1c (Completed)   Comprehensive metabolic panel (Completed)   Lipid panel (Completed)   TSH (Completed)   CBC (Completed)   Microalbumin / creatinine urine ratio (Completed)   TSH   CBC   Lipid panel   Comprehensive metabolic panel   Hemoglobin A1c   Asthma, mild intermittent   Relevant Orders   Hemoglobin A1c (Completed)   Comprehensive metabolic panel (Completed)   Lipid panel (Completed)   TSH (Completed)   CBC (Completed)   Microalbumin / creatinine urine ratio (Completed)   TSH   CBC   Lipid panel   Comprehensive metabolic panel   Hemoglobin A1c   Anemia    Resolved on last blood draw. Increase leafy greens, consider increased lean red meat and using cast iron cookware. Continue to monitor, report any concerns         I have discontinued Ms. Holloran's meloxicam, HYDROcodone-homatropine, and cyclobenzaprine. I have also changed her furosemide. Additionally, I am having her maintain her aspirin, triamcinolone cream,  multivitamin, budesonide-formoterol, albuterol, fluticasone, ALIGN, diclofenac sodium, albuterol, pantoprazole, diphenhydramine-acetaminophen, sennosides-docusate sodium, gabapentin, HYDROcodone-acetaminophen, carvedilol, chlorpheniramine-HYDROcodone, ferrous fumarate, levothyroxine, benazepril, amLODipine, and atorvastatin.  Meds ordered this encounter  Medications  . furosemide (LASIX) 20 MG tablet    Sig: Take 1-2 tablets (20-40 mg total) by mouth daily as needed for fluid.    Dispense:  120 tablet    Refill:  1     Penni Homans, MD

## 2015-12-29 NOTE — Assessment & Plan Note (Signed)
On Levothyroxine, continue to monitor 

## 2015-12-29 NOTE — Progress Notes (Signed)
Pre visit review using our clinic review tool, if applicable. No additional management support is needed unless otherwise documented below in the visit note. 

## 2015-12-29 NOTE — Assessment & Plan Note (Signed)
Encouraged heart healthy diet, increase exercise, avoid trans fats, consider a krill oil cap daily. Tolerating Atorvastatin 

## 2015-12-29 NOTE — Patient Instructions (Signed)

## 2015-12-29 NOTE — Progress Notes (Signed)
Patient ID: Katelyn Fisher, female   DOB: Nov 24, 1950, 65 y.o.   MRN: [id number]   Subjective: This patient presents again for a schedule visit requesting debridement of long thick toenails. 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

## 2015-12-29 NOTE — Assessment & Plan Note (Signed)
Resolved on last blood draw. Increase leafy greens, consider increased lean red meat and using cast iron cookware. Continue to monitor, report any concerns

## 2016-01-08 NOTE — Assessment & Plan Note (Signed)
Right knee flared recently following with pain management Dr Nelva Bush

## 2016-01-08 NOTE — Assessment & Plan Note (Signed)
hgba1c acceptable, minimize simple carbs. Increase exercise as tolerated. Continue current meds 

## 2016-01-09 ENCOUNTER — Encounter: Payer: Self-pay | Admitting: Family Medicine

## 2016-01-13 ENCOUNTER — Other Ambulatory Visit: Payer: Self-pay | Admitting: Family Medicine

## 2016-01-17 NOTE — Telephone Encounter (Signed)
error 

## 2016-01-23 ENCOUNTER — Ambulatory Visit: Payer: Commercial Managed Care - HMO | Admitting: Oncology

## 2016-01-23 ENCOUNTER — Other Ambulatory Visit: Payer: Commercial Managed Care - HMO

## 2016-01-26 ENCOUNTER — Encounter: Payer: Self-pay | Admitting: Pediatrics

## 2016-01-26 ENCOUNTER — Ambulatory Visit (INDEPENDENT_AMBULATORY_CARE_PROVIDER_SITE_OTHER): Admitting: Pediatrics

## 2016-01-26 VITALS — BP 140/90 | HR 72 | Temp 98.1°F | Resp 16 | Ht 61.4 in | Wt 239.9 lb

## 2016-01-26 DIAGNOSIS — C541 Malignant neoplasm of endometrium: Secondary | ICD-10-CM

## 2016-01-26 DIAGNOSIS — K219 Gastro-esophageal reflux disease without esophagitis: Secondary | ICD-10-CM | POA: Diagnosis not present

## 2016-01-26 DIAGNOSIS — I10 Essential (primary) hypertension: Secondary | ICD-10-CM | POA: Diagnosis not present

## 2016-01-26 DIAGNOSIS — J4520 Mild intermittent asthma, uncomplicated: Secondary | ICD-10-CM

## 2016-01-26 HISTORY — DX: Mild intermittent asthma, uncomplicated: J45.20

## 2016-01-26 LAB — PULMONARY FUNCTION TEST

## 2016-01-26 MED ORDER — BUDESONIDE-FORMOTEROL FUMARATE 160-4.5 MCG/ACT IN AERO: INHALATION_SPRAY | RESPIRATORY_TRACT | Status: DC

## 2016-01-26 MED ORDER — ALBUTEROL SULFATE HFA 108 (90 BASE) MCG/ACT IN AERS: 2.0000 | INHALATION_SPRAY | RESPIRATORY_TRACT | Status: DC | PRN

## 2016-01-26 NOTE — Patient Instructions (Signed)
Cetirizine 10 mg once a day if needed for runny nose Pro-air 2 puffs every 4 hours if needed for wheezing or coughing spells but if she needs Pro-air or albuterol  In a nebulizer more than 2 days per week she will start on Symbicort 160 --2 puffs every 12 hours for 2 weeks. If she is doing well, she may then taper Symbicort Continue on her other medications Call me if she is not doing well on this treatment plan

## 2016-01-26 NOTE — Progress Notes (Signed)
[place] [address] Dept: [phone]  FOLLOW UP NOTE  Patient ID: Katelyn Fisher, female    DOB: 01/29/1951  Age: 65 y.o. MRN: [id number] Date of Office Visit: 01/26/2016  Assessment Chief Complaint: Asthma  HPI Katelyn Fisher presents for follow-up of asthma. She was last seen in January of this year. Her symptoms have been much improved and she very rarely has to use Pro-air. She has not used Symbicort since February. Her endometrial cancer is stable. She is not having significant nasal congestion  Current medications are Pro-air 2 puffs every 4 hours if needed or instead albuterol 0.083% one unit dose every 4 hours if needed, Symbicort 160-2 puffs every 12 hours if needed, fluticasone 2 sprays per nostril once a day if needed, pantoprazole 40 mg once a day, triamcinolone 0.1% cream once  a day if needed. Her other medications are outlined in the chart.   Drug Allergies:  Allergies  Allergen Reactions  . Oxycodone Swelling    Facial swelling and tightness  . Penicillins Swelling    Rash with welps  . Shellfish Allergy Hives, Shortness Of Breath and Swelling  . Iodine     Rash  . Sulfa Antibiotics Rash    Physical Exam: BP 140/90 mmHg  Pulse 72  Temp(Src) 98.1 F (36.7 C) (Oral)  Resp 16  Ht 5' 1.4" (1.56 m)  Wt 239 lb 13.8 oz (108.8 kg)  BMI 44.71 kg/m2   Physical Exam  Constitutional: She appears well-developed and well-nourished.  HENT:  Eyes normal. Ears normal. Nose normal. Pharynx normal.  Neck: Neck supple.  Cardiovascular:  S1 and S2 normal no murmurs  Pulmonary/Chest:  Clear to percussion auscultation  Lymphadenopathy:    She has no cervical adenopathy.  Psychiatric: She has a normal mood and affect. Her behavior is normal. Judgment and thought content normal.  Vitals reviewed.   Diagnostics:  FVC 2.22 L FEV1 1.73 L. Predicted FVC 1.85 L predicted FEV1 1.48 L-the spirometry is in the normal range  Assessment and Plan: 1.  Mild intermittent asthma, uncomplicated   2. Essential hypertension   3. Endometrial cancer ([place])   4. Gastroesophageal reflux disease without esophagitis     Meds ordered this encounter  Medications  . albuterol (PROAIR HFA) 108 (90 Base) MCG/ACT inhaler    Sig: Inhale 2 puffs into the lungs every 4 (four) hours as needed for wheezing or shortness of breath.    Dispense:  1 Inhaler    Refill:  2  . DISCONTD: budesonide-formoterol (SYMBICORT) 160-4.5 MCG/ACT inhaler    Sig: TWO PUFFS TWICE A DAY TO PREVENT COUGH OR WHEEZE. RINSE, GARGLE AND SPIT AFTER USE.    Dispense:  1 Inhaler    Refill:  5  . budesonide-formoterol (SYMBICORT) 160-4.5 MCG/ACT inhaler    Sig: TWO PUFFS TWICE A DAY TO PREVENT COUGH OR WHEEZE. RINSE, GARGLE AND SPIT AFTER USE.    Dispense:  1 Inhaler    Refill:  5    Patient Instructions  Cetirizine 10 mg once a day if needed for runny nose Pro-air 2 puffs every 4 hours if needed for wheezing or coughing spells but if she needs Pro-air or albuterol  In a nebulizer more than 2 days per week she will start on Symbicort 160 --2 puffs every 12 hours for 2 weeks. If she is doing well, she may then taper Symbicort Continue on her other medications Call me if she is not doing well on this treatment plan  Return in about 6 months (around 07/28/2016).    Thank you for the opportunity to care for this patient.  Please do not hesitate to contact me with questions.  Penne Lash, M.D.  [facility] [address] [place], [place] [id number] [phone]

## 2016-01-27 ENCOUNTER — Other Ambulatory Visit: Payer: Self-pay

## 2016-01-27 DIAGNOSIS — C541 Malignant neoplasm of endometrium: Secondary | ICD-10-CM

## 2016-01-29 ENCOUNTER — Other Ambulatory Visit: Payer: Self-pay | Admitting: Oncology

## 2016-01-30 ENCOUNTER — Telehealth: Payer: Self-pay | Admitting: Oncology

## 2016-01-30 ENCOUNTER — Ambulatory Visit (HOSPITAL_BASED_OUTPATIENT_CLINIC_OR_DEPARTMENT_OTHER): Payer: Commercial Managed Care - HMO

## 2016-01-30 ENCOUNTER — Other Ambulatory Visit (HOSPITAL_BASED_OUTPATIENT_CLINIC_OR_DEPARTMENT_OTHER): Payer: Commercial Managed Care - HMO

## 2016-01-30 ENCOUNTER — Encounter: Payer: Self-pay | Admitting: Oncology

## 2016-01-30 ENCOUNTER — Ambulatory Visit (HOSPITAL_BASED_OUTPATIENT_CLINIC_OR_DEPARTMENT_OTHER): Payer: Commercial Managed Care - HMO | Admitting: Oncology

## 2016-01-30 VITALS — BP 138/98 | HR 78 | Temp 97.9°F | Resp 18 | Ht 61.4 in | Wt 239.0 lb

## 2016-01-30 DIAGNOSIS — R718 Other abnormality of red blood cells: Secondary | ICD-10-CM | POA: Diagnosis not present

## 2016-01-30 DIAGNOSIS — I878 Other specified disorders of veins: Secondary | ICD-10-CM

## 2016-01-30 DIAGNOSIS — G620 Drug-induced polyneuropathy: Secondary | ICD-10-CM | POA: Diagnosis not present

## 2016-01-30 DIAGNOSIS — C541 Malignant neoplasm of endometrium: Secondary | ICD-10-CM | POA: Diagnosis not present

## 2016-01-30 DIAGNOSIS — Z95828 Presence of other vascular implants and grafts: Secondary | ICD-10-CM

## 2016-01-30 DIAGNOSIS — T451X5A Adverse effect of antineoplastic and immunosuppressive drugs, initial encounter: Secondary | ICD-10-CM

## 2016-01-30 LAB — CBC WITH DIFFERENTIAL/PLATELET
BASO%: 0.5 % (ref 0.0–2.0)
Basophils Absolute: 0 10*3/uL (ref 0.0–0.1)
EOS%: 1.2 % (ref 0.0–7.0)
Eosinophils Absolute: 0.1 10*3/uL (ref 0.0–0.5)
HEMATOCRIT: 37.2 % (ref 34.8–46.6)
HEMOGLOBIN: 11.4 g/dL — AB (ref 11.6–15.9)
LYMPH#: 1.9 10*3/uL (ref 0.9–3.3)
LYMPH%: 33.4 % (ref 14.0–49.7)
MCH: 20.7 pg — ABNORMAL LOW (ref 25.1–34.0)
MCHC: 30.8 g/dL — AB (ref 31.5–36.0)
MCV: 67.1 fL — ABNORMAL LOW (ref 79.5–101.0)
MONO#: 0.6 10*3/uL (ref 0.1–0.9)
MONO%: 10 % (ref 0.0–14.0)
NEUT%: 54.9 % (ref 38.4–76.8)
NEUTROS ABS: 3.1 10*3/uL (ref 1.5–6.5)
Platelets: 191 10*3/uL (ref 145–400)
RBC: 5.54 10*6/uL — ABNORMAL HIGH (ref 3.70–5.45)
RDW: 16.9 % — AB (ref 11.2–14.5)
WBC: 5.7 10*3/uL (ref 3.9–10.3)

## 2016-01-30 MED ORDER — SODIUM CHLORIDE 0.9% FLUSH
10.0000 mL | INTRAVENOUS | Status: DC | PRN
  Administered 2016-01-30: 10 mL via INTRAVENOUS
  Filled 2016-01-30: qty 10

## 2016-01-30 MED ORDER — HEPARIN SOD (PORK) LOCK FLUSH 100 UNIT/ML IV SOLN
500.0000 [IU] | Freq: Once | INTRAVENOUS | Status: AC
  Administered 2016-01-30: 500 [IU] via INTRAVENOUS
  Filled 2016-01-30: qty 5

## 2016-01-30 NOTE — Progress Notes (Signed)
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 She saw Dr Skeet Latch 06-2015, has not seen Dr Dellis Filbert since Dec, is to see Dr Isidore Moos on 03-09-16.  She has PAC, managed by med onc.  Patient has felt better overall since she began walking 3 miles daily for past month, this at encouragement of Dr Nelva Bush after injection in knee. In past [facility], which I have encouraged her to resume also. Peripheral neuropathy has improved, hands now "feel rough" but no longer cold, and feet "like wire" but no longer cold and this actually improved. I have told her that water exercise has helped significantly with chemo peripheral neuropathy in several other patients. She denies abdominal or pelvic discomfort, bleeding, LE swelling, SOB other than with the increased exercise. Appetite good, bowels moving regularly, no discomfort at [facility]. No recent fever or symptoms of infection.  Remainder of 10 point Review of Systems negative  PAC in. Peripheral IV access extremely difficult, so will leave in for now. NOTE unable to draw from [facility] today, thought technique by staff in training. Also unable to draw labs peripherally today on several attempts.  Genetics testing normal 11-2014 (comprehensive cancer panel by [facility])  ONCOLOGIC HISTORY Patient presented to Dr Dellis Filbert with new onset postmenopausal spotting. [place] 05-07-14 reportedly had thickened endometrial stripe, with 3 fibroids largest 5x 5x 3.5 cm; endometrial biopsy 05-07-14 reportedly was concerning for at least FIGO grade 1 endometrial carcinoma. She had consultation with Dr Denman George on  05-20-14. Surgery was robotic hysterectomy, BSO, bilateral pelvic and para-aortic nodes by Dr Skeet Latch at [facility] on 06-23-14. Patient did well with surgery, discharged home on POD #1. 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 PET/ CT at [facility] 07-30-14 had no findings of concern for distant disease. [facility] multidisciplinary conference recommended 6 cycles of taxane platinum chemotherapy followed by vaginal brachytherapy, and for genetics counseling.  Cycle 1 carboplatin taxol was given 2-08-24-14, with neutropenia day 10, granix added. Initial severe nausea controlled with addition of EMEND and aches controlled with claritin. She completed 6 cycles of carboplatin taxol on 12-06-14, with granix support. HDR x 5 completed 01-03-15 with dose 30 Gy.    Objective:  Vital signs in last 24 hours:  BP 140/104 mmHg  Pulse 78  Temp(Src) 97.9 F (36.6 C) (Oral)  Resp 18  Ht 5' 1.4" (1.56 m)  Wt 239 lb (108.41 kg)  BMI 44.55 kg/m2  SpO2 100%  Weight stable by our scales BP up after PAC flush, repeat manual 138/98 Alert, oriented and appropriate. Ambulatory without assistance, wearing sandals. Looks comfortable, tho was upset by difficulty drawing blood. Obese.  HEENT:PERRL, sclerae not icteric. Oral mucosa moist without lesions, posterior pharynx clear.  Neck supple. No JVD.  Lymphatics:no cervical,supraclavicular, axillary or appreciable inguinal adenopathy Resp: clear to auscultation bilaterally and normal percussion bilaterally Cardio: regular rate and rhythm. No gallop. GI: abdomen obese, soft, nontender, not distended, no mass or organomegaly. Normally  active bowel sounds. Surgical incisions not remarkable. Musculoskeletal/ Extremities: without pitting  edema, cords, tenderness Neuro: peripheral neuropathy feet > hands as noted. Otherwise nonfocal  PSYCH appropriate mood and affect Skin without rash, ecchymosis, petechiae Breasts: without dominant mass, skin or nipple findings. Axillae benign. Portacath-without erythema or tenderness  Lab Results: Results for CBC below may not be accurate, as difficult draw from [facility] and chemistries not correct Results for orders placed or performed in visit on 01/30/16  CBC with Differential  Result Value Ref Range   WBC 5.7 3.9 - 10.3 10e3/uL   NEUT# 3.1 1.5 - 6.5 10e3/uL   HGB 11.4 (L) 11.6 - 15.9 g/dL   HCT 37.2 34.8 - 46.6 %   Platelets 191 145 - 400 10e3/uL   MCV 67.1 (L) 79.5 - 101.0 fL   MCH 20.7 (L) 25.1 - 34.0 pg   MCHC 30.8 (L) 31.5 - 36.0 g/dL   RBC 5.54 (H) 3.70 - 5.45 10e6/uL   RDW 16.9 (H) 11.2 - 14.5 %   lymph# 1.9 0.9 - 3.3 10e3/uL   MONO# 0.6 0.1 - 0.9 10e3/uL   Eosinophils Absolute 0.1 0.0 - 0.5 10e3/uL   Basophils Absolute 0.0 0.0 - 0.1 10e3/uL   NEUT% 54.9 38.4 - 76.8 %   LYMPH% 33.4 14.0 - 49.7 %   MONO% 10.0 0.0 - 14.0 %   EOS% 1.2 0.0 - 7.0 %   BASO% 0.5 0.0 - 2.0 %   Labs from 12-29-15 including CMET and CBC reviewed in EMR. WBC 5.2, Hgb 12, plt 243, MCV 66. Creat .66, LFTs WNL  Studies/Results: No hemoglobin electrophoresis or iron studies found at least in last 2 years in this EMR  Mamm[facility] at [facility] 11-10-15 without mammographic findings of concern   Medications: I have reviewed the patient's current medications.  DISCUSSION Clinically doing well from standpoint of endometrial cancer other than chemo related peripheral neuropathy, which is at least a little better. Encouraged her to continue regular walking exercise program and recommended that she resume water aerobics in addition, both for weight loss to ideal and as the water aerobics may help peripheral neuropathy also  Discussed scheduling back to Dr Skeet Latch with gyn onc, appointment set up now. PAC  flush schedule continued. 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 Genetics testing no abnormalities identified. She will keep appointments with Drs Skeet Latch and Isidore Moos; med onc to see her coordinating with PAC flush after first of year. 2.PAC in: flush every 6-8 weeks when not otherwise used. Peripheral IV access extremely difficult, so leave this for now. Unable to draw from [facility] today, see above. 3. .MCV ~ 66 unchanged, tho hgb good. WIll check iron stores and hemoglobin electrophoresis when able to obtain blood. No history of sickle cell trait or other hemoglobinopathy  listed in this EMR.  4.arthritis LE under care of Dr Nelva Bush 5.obesity, BMI 43.5. She enjoys walking and using Fit[employer], encouraged. Would be best from cancer and orthopedic standpoint to lose weight to ideal 6.asthma intermittent: no symptoms now, continuing usual medications 7.past colon polyps, hx diverticulitis, GERD 8.chemo peripheral neuropathy fingers and feet:  improving with exercise. Resume water aerobics 9.post cholecystectomy 10.hx benign positional vertigo. HTN followed by PCP  All questions answered. Time spent 25 min  including >50% counseling and coordination of care. Route PCP.    LIVESAY,LENNIS P, MD   01/30/2016, 12:06 PM

## 2016-01-30 NOTE — Telephone Encounter (Signed)
appt made and avs printed °

## 2016-02-21 LAB — HM DIABETES FOOT EXAM

## 2016-03-09 ENCOUNTER — Ambulatory Visit: Payer: Commercial Managed Care - HMO | Admitting: Radiation Oncology

## 2016-03-12 ENCOUNTER — Ambulatory Visit
Admission: RE | Admit: 2016-03-12 | Discharge: 2016-03-12 | Disposition: A | Discharge status: Home / Self Care | Payer: Commercial Managed Care - HMO | Source: Ambulatory Visit | Attending: Radiation Oncology | Admitting: Radiation Oncology

## 2016-03-12 ENCOUNTER — Encounter: Payer: Self-pay | Admitting: Radiation Oncology

## 2016-03-12 DIAGNOSIS — C541 Malignant neoplasm of endometrium: Secondary | ICD-10-CM | POA: Insufficient documentation

## 2016-03-12 NOTE — Progress Notes (Signed)
Katelyn Fisher is here for follow up of radiation completed 01/03/15 of HDR Brachytherapy in 5 fractions. She denies pain. She is having normal bowel movements, and no GU issues. She denies vaginal bleeding. She reports using the vaginal dilator at times.  She is exercising when her Right knee will let her. She has questions regarding how we know if her cancer has come back.   BP 139/89   Pulse 80   Temp 98.2 F (36.8 C)   Ht 5' 1.4" (1.56 m)   Wt 244 lb 12.8 oz (111 kg)   SpO2 100% Comment: room air  BMI 45.65 kg/m    Wt Readings from Last 3 Encounters:  03/12/16 244 lb 12.8 oz (111 kg)  01/30/16 239 lb (108.4 kg)  01/26/16 239 lb 13.8 oz (108.8 kg)

## 2016-03-13 NOTE — Progress Notes (Signed)
Radiation Oncology         [phone] ________________________________  Name: Katelyn Fisher MRN: [id number]  Date: 03/12/2016  DOB: 1950/09/22  Follow-Up Visit Note  Outpatient  CC: Penni Homans, MD  Janie Morning, MD  Diagnosis and Prior Radiotherapy:    ICD-9-CM ICD-10-CM   1. Endometrial cancer (HCC) 182.0 C54.1    Grade 3 Mixed serous endometroid endometrial carcinoma FIGO Stage IA (T1a, N0, M0)  Radiation Treatment Dates: 12/17/14-01/03/15 Site/Dose: HDR Brachytherapy with 30 Gy in 5 fractions  Narrative:  The patient returns today for routine follow-up.  She is doing well.   She denies pain. She is having normal bowel movements, and no GU issues. She denies vaginal bleeding. She reports using the vaginal dilator at times.  She is exercising when her Right knee will let her.   ALLERGIES:  is allergic to oxycodone; penicillins; shellfish allergy; iodine; and sulfa antibiotics.  Meds: Current Outpatient Prescriptions  Medication Sig Dispense Refill  . albuterol (PROAIR HFA) 108 (90 Base) MCG/ACT inhaler Inhale 2 puffs into the lungs every 4 (four) hours as needed for wheezing or shortness of breath. 1 Inhaler 2  . albuterol (PROVENTIL) (2.5 MG/3ML) 0.083% nebulizer solution Take 3 mLs (2.5 mg total) by nebulization every 6 (six) hours as needed for wheezing or shortness of breath. 150 mL 1  . amLODipine (NORVASC) 10 MG tablet TAKE 1 TABLET EVERY DAY 90 tablet 2  . aspirin 81 MG tablet Take 81 mg by mouth daily.      Marland Kitchen atorvastatin (LIPITOR) 20 MG tablet TAKE 0.5 TABLETS (10 MG TOTAL) BY MOUTH DAILY. 90 tablet 0  . benazepril (LOTENSIN) 40 MG tablet TAKE 1 TABLET EVERY DAY 90 tablet 2  . budesonide-formoterol (SYMBICORT) 160-4.5 MCG/ACT inhaler TWO PUFFS TWICE A DAY TO PREVENT COUGH OR WHEEZE. RINSE, GARGLE AND SPIT AFTER USE. 1 Inhaler 5  . carvedilol (COREG) 12.5 MG tablet Take 1 tablet (12.5 mg total) by mouth 2 (two) times daily with a meal. 180 tablet 2  .  diphenhydramine-acetaminophen (TYLENOL PM) 25-500 MG TABS Take 1 tablet by mouth at bedtime as needed. Reported on 01/30/2016    . ferrous fumarate (HEMOCYTE - 106 MG FE) 325 (106 Fe) MG TABS tablet Take 1 tablet daily on an empty stomach with OJ. 30 each 3  . fluticasone (FLONASE) 50 MCG/ACT nasal spray Place 2 sprays into the nose daily. 16 g 0  . furosemide (LASIX) 20 MG tablet Take 1-2 tablets (20-40 mg total) by mouth daily as needed for fluid. 120 tablet 1  . HYDROcodone-acetaminophen (NORCO) 10-325 MG per tablet Take 1 tablet by mouth 2 (two) times daily as needed. Reported on 09/05/2015  0  . levothyroxine (SYNTHROID, LEVOTHROID) 75 MCG tablet TAKE 1 TABLET (75 MCG TOTAL) BY MOUTH DAILY BEFORE BREAKFAST. 30 tablet 3  . Multiple Vitamin (MULTIVITAMIN) tablet Take 1 tablet by mouth daily.    . pantoprazole (PROTONIX) 40 MG tablet Take 1 tablet (40 mg total) by mouth daily. 90 tablet 2  . Probiotic Product (ALIGN) 4 MG CAPS Take 1 capsule by mouth daily.     . sennosides-docusate sodium (SENOKOT-S) 8.6-50 MG tablet Take 1 tablet by mouth daily as needed for constipation.    . diclofenac sodium (VOLTAREN) 1 % GEL Apply 2 g topically 4 (four) times daily as needed.     . triamcinolone (KENALOG) 0.1 % cream Apply 1 application topically. Apply topically to affected area once daily      No current  facility-administered medications for this encounter.     Physical Findings: The patient is in no acute distress. Patient is alert and oriented.  height is 5' 1.4" (1.56 m) and weight is 244 lb 12.8 oz (111 kg). Her temperature is 98.2 F (36.8 C). Her blood pressure is 139/89 and her pulse is 80. Her oxygen saturation is 100%. .    Neck  Without massess Heart RRR Chest CTAB Abdomen and pelvis are non-tender to palpation, no rigidity or guarding.   No groin adenopathy GYN exam- ext genitalia unremarkable.  Vaginal vault without evidence of disease on speculum and digital exam.  No vaginal-rectal  fistulas  Lab Findings: Lab Results  Component Value Date   WBC 5.7 01/30/2016   HGB 11.4 (L) 01/30/2016   HCT 37.2 01/30/2016   MCV 67.1 (L) 01/30/2016   PLT 191 01/30/2016    Radiographic Findings: No results found.  Impression/Plan: NED She will follow up with me in 6 months  Continue seeing GYN onc  _____________________________________   Eppie Gibson, MD

## 2016-03-16 ENCOUNTER — Other Ambulatory Visit: Payer: Self-pay | Admitting: Pharmacist

## 2016-03-16 NOTE — Patient Outreach (Signed)
Outreach call to [employer] regarding her request for follow up from the [facility] Medication Adherence Campaign. Left a HIPAA compliant message on the patient's voicemail.  Harlow Asa, PharmD Clinical Pharmacist [place] [facility] [phone]

## 2016-03-19 ENCOUNTER — Other Ambulatory Visit: Payer: Self-pay | Admitting: Pharmacist

## 2016-03-19 NOTE — Patient Outreach (Signed)
Receive a voicemail from Ms. Gilberg requesting a call back. Outreach call to [employer] again regarding her request for follow up from the [facility] Medication Adherence Campaign. Left a HIPAA compliant message on the patient's voicemail.  Harlow Asa, PharmD Clinical Pharmacist [place] [facility] [phone]

## 2016-03-21 ENCOUNTER — Other Ambulatory Visit: Payer: Self-pay | Admitting: Pharmacist

## 2016-03-21 NOTE — Patient Outreach (Signed)
Receive another voicemail from [name] asking that I call her back on her cell phone. Called and spoke with patient. HIPAA identifiers verified and verbal consent received.  Let Katelyn Fisher know the reason for my call. Patient reports that she is not at home now. Patient asks that I call her back next Wednesday so that we can go over her medications. Patient reports that he has no medication questions or concerns at this time.  PLAN:  Will call Katelyn Fisher on Wednesday, 03/28/16 at 9 AM to discuss her medications.  Katelyn Fisher, PharmD Clinical Pharmacist [place] [facility] [phone]

## 2016-03-27 ENCOUNTER — Ambulatory Visit (HOSPITAL_BASED_OUTPATIENT_CLINIC_OR_DEPARTMENT_OTHER): Payer: Commercial Managed Care - HMO

## 2016-03-27 VITALS — BP 144/87 | HR 86 | Temp 98.1°F

## 2016-03-27 DIAGNOSIS — Z452 Encounter for adjustment and management of vascular access device: Secondary | ICD-10-CM | POA: Diagnosis not present

## 2016-03-27 DIAGNOSIS — Z95828 Presence of other vascular implants and grafts: Secondary | ICD-10-CM

## 2016-03-27 DIAGNOSIS — C541 Malignant neoplasm of endometrium: Secondary | ICD-10-CM

## 2016-03-27 MED ORDER — HEPARIN SOD (PORK) LOCK FLUSH 100 UNIT/ML IV SOLN
500.0000 [IU] | Freq: Once | INTRAVENOUS | Status: AC | PRN
  Administered 2016-03-27: 500 [IU] via INTRAVENOUS
  Filled 2016-03-27: qty 5

## 2016-03-27 MED ORDER — SODIUM CHLORIDE 0.9 % IJ SOLN
10.0000 mL | INTRAMUSCULAR | Status: DC | PRN
  Administered 2016-03-27: 10 mL via INTRAVENOUS
  Filled 2016-03-27: qty 10

## 2016-03-27 NOTE — Patient Instructions (Signed)

## 2016-03-28 ENCOUNTER — Ambulatory Visit: Payer: Self-pay | Admitting: Pharmacist

## 2016-03-29 ENCOUNTER — Telehealth: Payer: Self-pay

## 2016-03-29 NOTE — Telephone Encounter (Signed)
Faxed signed medical clearance form to Ms Leeanne Deed  For [employer] program enrollment. Sent a copy to HIM to be scanned into patient's EMR.

## 2016-03-30 ENCOUNTER — Ambulatory Visit (INDEPENDENT_AMBULATORY_CARE_PROVIDER_SITE_OTHER): Payer: Commercial Managed Care - HMO | Admitting: Family Medicine

## 2016-03-30 VITALS — BP 128/90 | HR 81 | Temp 97.6°F | Wt 244.4 lb

## 2016-03-30 DIAGNOSIS — C541 Malignant neoplasm of endometrium: Secondary | ICD-10-CM

## 2016-03-30 DIAGNOSIS — G4733 Obstructive sleep apnea (adult) (pediatric): Secondary | ICD-10-CM

## 2016-03-30 DIAGNOSIS — E785 Hyperlipidemia, unspecified: Secondary | ICD-10-CM

## 2016-03-30 DIAGNOSIS — Z0000 Encounter for general adult medical examination without abnormal findings: Secondary | ICD-10-CM

## 2016-03-30 DIAGNOSIS — Z23 Encounter for immunization: Secondary | ICD-10-CM

## 2016-03-30 DIAGNOSIS — Z6841 Body Mass Index (BMI) 40.0 and over, adult: Secondary | ICD-10-CM

## 2016-03-30 DIAGNOSIS — E079 Disorder of thyroid, unspecified: Secondary | ICD-10-CM

## 2016-03-30 DIAGNOSIS — E118 Type 2 diabetes mellitus with unspecified complications: Secondary | ICD-10-CM | POA: Diagnosis not present

## 2016-03-30 DIAGNOSIS — I10 Essential (primary) hypertension: Secondary | ICD-10-CM

## 2016-03-30 DIAGNOSIS — K219 Gastro-esophageal reflux disease without esophagitis: Secondary | ICD-10-CM

## 2016-03-30 NOTE — Assessment & Plan Note (Signed)
hgba1c acceptable, minimize simple carbs. Increase exercise as tolerated.  

## 2016-03-30 NOTE — Progress Notes (Signed)
Pre visit review using our clinic review tool, if applicable. No additional management support is needed unless otherwise documented below in the visit note. 

## 2016-03-30 NOTE — Patient Instructions (Signed)
Preventive Care for Adults, Female A healthy lifestyle and preventive care can promote health and wellness. Preventive health guidelines for women include the following key practices.  A routine yearly physical is a good way to check with your health care provider about your health and preventive screening. It is a chance to share any concerns and updates on your health and to receive a thorough exam.  Visit your dentist for a routine exam and preventive care every 6 months. Brush your teeth twice a day and floss once a day. Good oral hygiene prevents tooth decay and gum disease.  The frequency of eye exams is based on your age, health, family medical history, use of contact lenses, and other factors. Follow your health care provider's recommendations for frequency of eye exams.  Eat a healthy diet. Foods like vegetables, fruits, whole grains, low-fat dairy products, and lean protein foods contain the nutrients you need without too many calories. Decrease your intake of foods high in solid fats, added sugars, and salt. Eat the right amount of calories for you.Get information about a proper diet from your health care provider, if necessary.  Regular physical exercise is one of the most important things you can do for your health. Most adults should get at least 150 minutes of moderate-intensity exercise (any activity that increases your heart rate and causes you to sweat) each week. In addition, most adults need muscle-strengthening exercises on 2 or more days a week.  Maintain a healthy weight. The body mass index (BMI) is a screening tool to identify possible weight problems. It provides an estimate of body fat based on height and weight. Your health care provider can find your BMI and can help you achieve or maintain a healthy weight.For adults 20 years and older:  A BMI below 18.5 is considered underweight.  A BMI of 18.5 to 24.9 is normal.  A BMI of 25 to 29.9 is considered overweight.  A  BMI of 30 and above is considered obese.  Maintain normal blood lipids and cholesterol levels by exercising and minimizing your intake of saturated fat. Eat a balanced diet with plenty of fruit and vegetables. Blood tests for lipids and cholesterol should begin at age 45 and be repeated every 5 years. If your lipid or cholesterol levels are high, you are over 50, or you are at high risk for heart disease, you may need your cholesterol levels checked more frequently.Ongoing high lipid and cholesterol levels should be treated with medicines if diet and exercise are not working.  If you smoke, find out from your health care provider how to quit. If you do not use tobacco, do not start.  Lung cancer screening is recommended for adults aged 45-80 years who are at high risk for developing lung cancer because of a history of smoking. A yearly low-dose CT scan of the lungs is recommended for people who have at least a 30-pack-year history of smoking and are a current smoker or have quit within the past 15 years. A pack year of smoking is smoking an average of 1 pack of cigarettes a day for 1 year (for example: 1 pack a day for 30 years or 2 packs a day for 15 years). Yearly screening should continue until the smoker has stopped smoking for at least 15 years. Yearly screening should be stopped for people who develop a health problem that would prevent them from having lung cancer treatment.  If you are pregnant, do not drink alcohol. If you are  breastfeeding, be very cautious about drinking alcohol. If you are not pregnant and choose to drink alcohol, do not have more than 1 drink per day. One drink is considered to be 12 ounces (355 mL) of beer, 5 ounces (148 mL) of wine, or 1.5 ounces (44 mL) of liquor.  Avoid use of street drugs. Do not share needles with anyone. Ask for help if you need support or instructions about stopping the use of drugs.  High blood pressure causes heart disease and increases the risk  of stroke. Your blood pressure should be checked at least every 1 to 2 years. Ongoing high blood pressure should be treated with medicines if weight loss and exercise do not work.  If you are 55-79 years old, ask your health care provider if you should take aspirin to prevent strokes.  Diabetes screening is done by taking a blood sample to check your blood glucose level after you have not eaten for a certain period of time (fasting). If you are not overweight and you do not have risk factors for diabetes, you should be screened once every 3 years starting at age 45. If you are overweight or obese and you are 40-70 years of age, you should be screened for diabetes every year as part of your cardiovascular risk assessment.  Breast cancer screening is essential preventive care for women. You should practice "breast self-awareness." This means understanding the normal appearance and feel of your breasts and may include breast self-examination. Any changes detected, no matter how small, should be reported to a health care provider. Women in their 20s and 30s should have a clinical breast exam (CBE) by a health care provider as part of a regular health exam every 1 to 3 years. After age 40, women should have a CBE every year. Starting at age 40, women should consider having a mammogram (breast X-ray test) every year. Women who have a family history of breast cancer should talk to their health care provider about genetic screening. Women at a high risk of breast cancer should talk to their health care providers about having an MRI and a mammogram every year.  Breast cancer gene (BRCA)-related cancer risk assessment is recommended for women who have family members with BRCA-related cancers. BRCA-related cancers include breast, ovarian, tubal, and peritoneal cancers. Having family members with these cancers may be associated with an increased risk for harmful changes (mutations) in the breast cancer genes BRCA1 and  BRCA2. Results of the assessment will determine the need for genetic counseling and BRCA1 and BRCA2 testing.  Your health care provider may recommend that you be screened regularly for cancer of the pelvic organs (ovaries, uterus, and vagina). This screening involves a pelvic examination, including checking for microscopic changes to the surface of your cervix (Pap test). You may be encouraged to have this screening done every 3 years, beginning at age 21.  For women ages 30-65, health care providers may recommend pelvic exams and Pap testing every 3 years, or they may recommend the Pap and pelvic exam, combined with testing for human papilloma virus (HPV), every 5 years. Some types of HPV increase your risk of cervical cancer. Testing for HPV may also be done on women of any age with unclear Pap test results.  Other health care providers may not recommend any screening for nonpregnant women who are considered low risk for pelvic cancer and who do not have symptoms. Ask your health care provider if a screening pelvic exam is right for   you.  If you have had past treatment for cervical cancer or a condition that could lead to cancer, you need Pap tests and screening for cancer for at least 20 years after your treatment. If Pap tests have been discontinued, your risk factors (such as having a new sexual partner) need to be reassessed to determine if screening should resume. Some women have medical problems that increase the chance of getting cervical cancer. In these cases, your health care provider may recommend more frequent screening and Pap tests.  Colorectal cancer can be detected and often prevented. Most routine colorectal cancer screening begins at the age of 50 years and continues through age 75 years. However, your health care provider may recommend screening at an earlier age if you have risk factors for colon cancer. On a yearly basis, your health care provider may provide home test kits to check  for hidden blood in the stool. Use of a small camera at the end of a tube, to directly examine the colon (sigmoidoscopy or colonoscopy), can detect the earliest forms of colorectal cancer. Talk to your health care provider about this at age 50, when routine screening begins. Direct exam of the colon should be repeated every 5-10 years through age 75 years, unless early forms of precancerous polyps or small growths are found.  People who are at an increased risk for hepatitis B should be screened for this virus. You are considered at high risk for hepatitis B if:  You were born in a country where hepatitis B occurs often. Talk with your health care provider about which countries are considered high risk.  Your parents were born in a high-risk country and you have not received a shot to protect against hepatitis B (hepatitis B vaccine).  You have HIV or AIDS.  You use needles to inject street drugs.  You live with, or have sex with, someone who has hepatitis B.  You get hemodialysis treatment.  You take certain medicines for conditions like cancer, organ transplantation, and autoimmune conditions.  Hepatitis C blood testing is recommended for all people born from 1945 through 1965 and any individual with known risks for hepatitis C.  Practice safe sex. Use condoms and avoid high-risk sexual practices to reduce the spread of sexually transmitted infections (STIs). STIs include gonorrhea, chlamydia, syphilis, trichomonas, herpes, HPV, and human immunodeficiency virus (HIV). Herpes, HIV, and HPV are viral illnesses that have no cure. They can result in disability, cancer, and death.  You should be screened for sexually transmitted illnesses (STIs) including gonorrhea and chlamydia if:  You are sexually active and are younger than 24 years.  You are older than 24 years and your health care provider tells you that you are at risk for this type of infection.  Your sexual activity has changed  since you were last screened and you are at an increased risk for chlamydia or gonorrhea. Ask your health care provider if you are at risk.  If you are at risk of being infected with HIV, it is recommended that you take a prescription medicine daily to prevent HIV infection. This is called preexposure prophylaxis (PrEP). You are considered at risk if:  You are sexually active and do not regularly use condoms or know the HIV status of your partner(s).  You take drugs by injection.  You are sexually active with a partner who has HIV.  Talk with your health care provider about whether you are at high risk of being infected with HIV. If   you choose to begin PrEP, you should first be tested for HIV. You should then be tested every 3 months for as long as you are taking PrEP.  Osteoporosis is a disease in which the bones lose minerals and strength with aging. This can result in serious bone fractures or breaks. The risk of osteoporosis can be identified using a bone density scan. Women ages 67 years and over and women at risk for fractures or osteoporosis should discuss screening with their health care providers. Ask your health care provider whether you should take a calcium supplement or vitamin D to reduce the rate of osteoporosis.  Menopause can be associated with physical symptoms and risks. Hormone replacement therapy is available to decrease symptoms and risks. You should talk to your health care provider about whether hormone replacement therapy is right for you.  Use sunscreen. Apply sunscreen liberally and repeatedly throughout the day. You should seek shade when your shadow is shorter than you. Protect yourself by wearing long sleeves, pants, a wide-brimmed hat, and sunglasses year round, whenever you are outdoors.  Once a month, do a whole body skin exam, using a mirror to look at the skin on your back. Tell your health care provider of new moles, moles that have irregular borders, moles that  are larger than a pencil eraser, or moles that have changed in shape or color.  Stay current with required vaccines (immunizations).  Influenza vaccine. All adults should be immunized every year.  Tetanus, diphtheria, and acellular pertussis (Td, Tdap) vaccine. Pregnant women should receive 1 dose of Tdap vaccine during each pregnancy. The dose should be obtained regardless of the length of time since the last dose. Immunization is preferred during the 27th-36th week of gestation. An adult who has not previously received Tdap or who does not know her vaccine status should receive 1 dose of Tdap. This initial dose should be followed by tetanus and diphtheria toxoids (Td) booster doses every 10 years. Adults with an unknown or incomplete history of completing a 3-dose immunization series with Td-containing vaccines should begin or complete a primary immunization series including a Tdap dose. Adults should receive a Td booster every 10 years.  Varicella vaccine. An adult without evidence of immunity to varicella should receive 2 doses or a second dose if she has previously received 1 dose. Pregnant females who do not have evidence of immunity should receive the first dose after pregnancy. This first dose should be obtained before leaving the health care facility. The second dose should be obtained 4-8 weeks after the first dose.  Human papillomavirus (HPV) vaccine. Females aged 13-26 years who have not received the vaccine previously should obtain the 3-dose series. The vaccine is not recommended for use in pregnant females. However, pregnancy testing is not needed before receiving a dose. If a female is found to be pregnant after receiving a dose, no treatment is needed. In that case, the remaining doses should be delayed until after the pregnancy. Immunization is recommended for any person with an immunocompromised condition through the age of 61 years if she did not get any or all doses earlier. During the  3-dose series, the second dose should be obtained 4-8 weeks after the first dose. The third dose should be obtained 24 weeks after the first dose and 16 weeks after the second dose.  Zoster vaccine. One dose is recommended for adults aged 30 years or older unless certain conditions are present.  Measles, mumps, and rubella (MMR) vaccine. Adults born  before 1957 generally are considered immune to measles and mumps. Adults born in 1957 or later should have 1 or more doses of MMR vaccine unless there is a contraindication to the vaccine or there is laboratory evidence of immunity to each of the three diseases. A routine second dose of MMR vaccine should be obtained at least 28 days after the first dose for students attending postsecondary schools, health care workers, or international travelers. People who received inactivated measles vaccine or an unknown type of measles vaccine during 1963-1967 should receive 2 doses of MMR vaccine. People who received inactivated mumps vaccine or an unknown type of mumps vaccine before 1979 and are at high risk for mumps infection should consider immunization with 2 doses of MMR vaccine. For females of childbearing age, rubella immunity should be determined. If there is no evidence of immunity, females who are not pregnant should be vaccinated. If there is no evidence of immunity, females who are pregnant should delay immunization until after pregnancy. Unvaccinated health care workers born before 1957 who lack laboratory evidence of measles, mumps, or rubella immunity or laboratory confirmation of disease should consider measles and mumps immunization with 2 doses of MMR vaccine or rubella immunization with 1 dose of MMR vaccine.  Pneumococcal 13-valent conjugate (PCV13) vaccine. When indicated, a person who is uncertain of his immunization history and has no record of immunization should receive the PCV13 vaccine. All adults 65 years of age and older should receive this  vaccine. An adult aged 19 years or older who has certain medical conditions and has not been previously immunized should receive 1 dose of PCV13 vaccine. This PCV13 should be followed with a dose of pneumococcal polysaccharide (PPSV23) vaccine. Adults who are at high risk for pneumococcal disease should obtain the PPSV23 vaccine at least 8 weeks after the dose of PCV13 vaccine. Adults older than 65 years of age who have normal immune system function should obtain the PPSV23 vaccine dose at least 1 year after the dose of PCV13 vaccine.  Pneumococcal polysaccharide (PPSV23) vaccine. When PCV13 is also indicated, PCV13 should be obtained first. All adults aged 65 years and older should be immunized. An adult younger than age 65 years who has certain medical conditions should be immunized. Any person who resides in a nursing home or long-term care facility should be immunized. An adult smoker should be immunized. People with an immunocompromised condition and certain other conditions should receive both PCV13 and PPSV23 vaccines. People with human immunodeficiency virus (HIV) infection should be immunized as soon as possible after diagnosis. Immunization during chemotherapy or radiation therapy should be avoided. Routine use of PPSV23 vaccine is not recommended for American Indians, [place] Natives, or people younger than 65 years unless there are medical conditions that require PPSV23 vaccine. When indicated, people who have unknown immunization and have no record of immunization should receive PPSV23 vaccine. One-time revaccination 5 years after the first dose of PPSV23 is recommended for people aged 19-64 years who have chronic kidney failure, nephrotic syndrome, asplenia, or immunocompromised conditions. People who received 1-2 doses of PPSV23 before age 65 years should receive another dose of PPSV23 vaccine at age 65 years or later if at least 5 years have passed since the previous dose. Doses of PPSV23 are not  needed for people immunized with PPSV23 at or after age 65 years.  Meningococcal vaccine. Adults with asplenia or persistent complement component deficiencies should receive 2 doses of quadrivalent meningococcal conjugate (MenACWY-D) vaccine. The doses should be obtained   at least 2 months apart. Microbiologists working with certain meningococcal bacteria, [place] recruits, people at risk during an outbreak, and people who travel to or live in countries with a high rate of meningitis should be immunized. A first-year college student up through age 34 years who is living in a residence hall should receive a dose if she did not receive a dose on or after her 16th birthday. Adults who have certain high-risk conditions should receive one or more doses of vaccine.  Hepatitis A vaccine. Adults who wish to be protected from this disease, have certain high-risk conditions, work with hepatitis A-infected animals, work in hepatitis A research labs, or travel to or work in countries with a high rate of hepatitis A should be immunized. Adults who were previously unvaccinated and who anticipate close contact with an international adoptee during the first 60 days after arrival in the [place] from a country with a high rate of hepatitis A should be immunized.  Hepatitis B vaccine. Adults who wish to be protected from this disease, have certain high-risk conditions, may be exposed to blood or other infectious body fluids, are household contacts or sex partners of hepatitis B positive people, are clients or workers in certain care facilities, or travel to or work in countries with a high rate of hepatitis B should be immunized.  Haemophilus influenzae type b (Hib) vaccine. A previously unvaccinated person with asplenia or sickle cell disease or having a scheduled splenectomy should receive 1 dose of Hib vaccine. Regardless of previous immunization, a recipient of a hematopoietic stem cell transplant should receive a  3-dose series 6-12 months after her successful transplant. Hib vaccine is not recommended for adults with HIV infection. Preventive Services / Frequency Ages 35 to 4 years  Blood pressure check.** / Every 3-5 years.  Lipid and cholesterol check.** / Every 5 years beginning at age 60.  Clinical breast exam.** / Every 3 years for women in their 71s and 10s.  BRCA-related cancer risk assessment.** / For women who have family members with a BRCA-related cancer (breast, ovarian, tubal, or peritoneal cancers).  Pap test.** / Every 2 years from ages 76 through 26. Every 3 years starting at age 61 through age 76 or 93 with a history of 3 consecutive normal Pap tests.  HPV screening.** / Every 3 years from ages 37 through ages 60 to 51 with a history of 3 consecutive normal Pap tests.  Hepatitis C blood test.** / For any individual with known risks for hepatitis C.  Skin self-exam. / Monthly.  Influenza vaccine. / Every year.  Tetanus, diphtheria, and acellular pertussis (Tdap, Td) vaccine.** / Consult your health care provider. Pregnant women should receive 1 dose of Tdap vaccine during each pregnancy. 1 dose of Td every 10 years.  Varicella vaccine.** / Consult your health care provider. Pregnant females who do not have evidence of immunity should receive the first dose after pregnancy.  HPV vaccine. / 3 doses over 6 months, if 93 and younger. The vaccine is not recommended for use in pregnant females. However, pregnancy testing is not needed before receiving a dose.  Measles, mumps, rubella (MMR) vaccine.** / You need at least 1 dose of MMR if you were born in 1957 or later. You may also need a 2nd dose. For females of childbearing age, rubella immunity should be determined. If there is no evidence of immunity, females who are not pregnant should be vaccinated. If there is no evidence of immunity, females who are  pregnant should delay immunization until after pregnancy.  Pneumococcal  13-valent conjugate (PCV13) vaccine.** / Consult your health care provider.  Pneumococcal polysaccharide (PPSV23) vaccine.** / 1 to 2 doses if you smoke cigarettes or if you have certain conditions.  Meningococcal vaccine.** / 1 dose if you are age 68 to 8 years and a Market researcher living in a residence hall, or have one of several medical conditions, you need to get vaccinated against meningococcal disease. You may also need additional booster doses.  Hepatitis A vaccine.** / Consult your health care provider.  Hepatitis B vaccine.** / Consult your health care provider.  Haemophilus influenzae type b (Hib) vaccine.** / Consult your health care provider. Ages 7 to 53 years  Blood pressure check.** / Every year.  Lipid and cholesterol check.** / Every 5 years beginning at age 25 years.  Lung cancer screening. / Every year if you are aged 11-80 years and have a 30-pack-year history of smoking and currently smoke or have quit within the past 15 years. Yearly screening is stopped once you have quit smoking for at least 15 years or develop a health problem that would prevent you from having lung cancer treatment.  Clinical breast exam.** / Every year after age 48 years.  BRCA-related cancer risk assessment.** / For women who have family members with a BRCA-related cancer (breast, ovarian, tubal, or peritoneal cancers).  Mammogram.** / Every year beginning at age 41 years and continuing for as long as you are in good health. Consult with your health care provider.  Pap test.** / Every 3 years starting at age 65 years through age 37 or 70 years with a history of 3 consecutive normal Pap tests.  HPV screening.** / Every 3 years from ages 72 years through ages 60 to 40 years with a history of 3 consecutive normal Pap tests.  Fecal occult blood test (FOBT) of stool. / Every year beginning at age 21 years and continuing until age 5 years. You may not need to do this test if you get  a colonoscopy every 10 years.  Flexible sigmoidoscopy or colonoscopy.** / Every 5 years for a flexible sigmoidoscopy or every 10 years for a colonoscopy beginning at age 35 years and continuing until age 48 years.  Hepatitis C blood test.** / For all people born from [birth year] through 1965 and any individual with known risks for hepatitis C.  Skin self-exam. / Monthly.  Influenza vaccine. / Every year.  Tetanus, diphtheria, and acellular pertussis (Tdap/Td) vaccine.** / Consult your health care provider. Pregnant women should receive 1 dose of Tdap vaccine during each pregnancy. 1 dose of Td every 10 years.  Varicella vaccine.** / Consult your health care provider. Pregnant females who do not have evidence of immunity should receive the first dose after pregnancy.  Zoster vaccine.** / 1 dose for adults aged 30 years or older.  Measles, mumps, rubella (MMR) vaccine.** / You need at least 1 dose of MMR if you were born in 1957 or later. You may also need a second dose. For females of childbearing age, rubella immunity should be determined. If there is no evidence of immunity, females who are not pregnant should be vaccinated. If there is no evidence of immunity, females who are pregnant should delay immunization until after pregnancy.  Pneumococcal 13-valent conjugate (PCV13) vaccine.** / Consult your health care provider.  Pneumococcal polysaccharide (PPSV23) vaccine.** / 1 to 2 doses if you smoke cigarettes or if you have certain conditions.  Meningococcal vaccine.** /  Consult your health care provider.  Hepatitis A vaccine.** / Consult your health care provider.  Hepatitis B vaccine.** / Consult your health care provider.  Haemophilus influenzae type b (Hib) vaccine.** / Consult your health care provider. Ages 64 years and over  Blood pressure check.** / Every year.  Lipid and cholesterol check.** / Every 5 years beginning at age 23 years.  Lung cancer screening. / Every year if you  are aged 16-80 years and have a 30-pack-year history of smoking and currently smoke or have quit within the past 15 years. Yearly screening is stopped once you have quit smoking for at least 15 years or develop a health problem that would prevent you from having lung cancer treatment.  Clinical breast exam.** / Every year after age 74 years.  BRCA-related cancer risk assessment.** / For women who have family members with a BRCA-related cancer (breast, ovarian, tubal, or peritoneal cancers).  Mammogram.** / Every year beginning at age 44 years and continuing for as long as you are in good health. Consult with your health care provider.  Pap test.** / Every 3 years starting at age 58 years through age 22 or 39 years with 3 consecutive normal Pap tests. Testing can be stopped between 65 and 70 years with 3 consecutive normal Pap tests and no abnormal Pap or HPV tests in the past 10 years.  HPV screening.** / Every 3 years from ages 64 years through ages 70 or 61 years with a history of 3 consecutive normal Pap tests. Testing can be stopped between 65 and 70 years with 3 consecutive normal Pap tests and no abnormal Pap or HPV tests in the past 10 years.  Fecal occult blood test (FOBT) of stool. / Every year beginning at age 40 years and continuing until age 27 years. You may not need to do this test if you get a colonoscopy every 10 years.  Flexible sigmoidoscopy or colonoscopy.** / Every 5 years for a flexible sigmoidoscopy or every 10 years for a colonoscopy beginning at age 7 years and continuing until age 32 years.  Hepatitis C blood test.** / For all people born from [birth year] through 1964 and any individual with known risks for hepatitis C.  Osteoporosis screening.** / A one-time screening for women ages 30 years and over and women at risk for fractures or osteoporosis.  Skin self-exam. / Monthly.  Influenza vaccine. / Every year.  Tetanus, diphtheria, and acellular pertussis (Tdap/Td)  vaccine.** / 1 dose of Td every 10 years.  Varicella vaccine.** / Consult your health care provider.  Zoster vaccine.** / 1 dose for adults aged 35 years or older.  Pneumococcal 13-valent conjugate (PCV13) vaccine.** / Consult your health care provider.  Pneumococcal polysaccharide (PPSV23) vaccine.** / 1 dose for all adults aged 46 years and older.  Meningococcal vaccine.** / Consult your health care provider.  Hepatitis A vaccine.** / Consult your health care provider.  Hepatitis B vaccine.** / Consult your health care provider.  Haemophilus influenzae type b (Hib) vaccine.** / Consult your health care provider. ** Family history and personal history of risk and conditions may change your health care provider's recommendations.   This information is not intended to replace advice given to you by your health care provider. Make sure you discuss any questions you have with your health care provider.   Document Released: 09/04/2001 Document Revised: 07/30/2014 Document Reviewed: 12/04/2010 Elsevier Interactive Patient Education [employer].

## 2016-04-02 ENCOUNTER — Other Ambulatory Visit (INDEPENDENT_AMBULATORY_CARE_PROVIDER_SITE_OTHER): Payer: Commercial Managed Care - HMO

## 2016-04-02 ENCOUNTER — Other Ambulatory Visit: Payer: Self-pay | Admitting: Pharmacist

## 2016-04-02 DIAGNOSIS — E118 Type 2 diabetes mellitus with unspecified complications: Secondary | ICD-10-CM | POA: Diagnosis not present

## 2016-04-02 DIAGNOSIS — E785 Hyperlipidemia, unspecified: Secondary | ICD-10-CM

## 2016-04-02 DIAGNOSIS — E079 Disorder of thyroid, unspecified: Secondary | ICD-10-CM | POA: Diagnosis not present

## 2016-04-02 DIAGNOSIS — I10 Essential (primary) hypertension: Secondary | ICD-10-CM

## 2016-04-02 DIAGNOSIS — C541 Malignant neoplasm of endometrium: Secondary | ICD-10-CM

## 2016-04-02 DIAGNOSIS — G4733 Obstructive sleep apnea (adult) (pediatric): Secondary | ICD-10-CM

## 2016-04-02 LAB — COMPREHENSIVE METABOLIC PANEL
ALBUMIN: 3.8 g/dL (ref 3.5–5.2)
ALK PHOS: 92 U/L (ref 39–117)
ALT: 17 U/L (ref 0–35)
AST: 18 U/L (ref 0–37)
BUN: 14 mg/dL (ref 6–23)
CHLORIDE: 105 meq/L (ref 96–112)
CO2: 30 mEq/L (ref 19–32)
Calcium: 9.1 mg/dL (ref 8.4–10.5)
Creatinine, Ser: 0.7 mg/dL (ref 0.40–1.20)
GFR: 108.25 mL/min (ref >60.00)
Glucose, Bld: 119 mg/dL — ABNORMAL HIGH (ref 70–99)
POTASSIUM: 3.9 meq/L (ref 3.5–5.1)
Sodium: 141 mEq/L (ref 135–145)
TOTAL PROTEIN: 6.9 g/dL (ref 6.0–8.3)
Total Bilirubin: 0.3 mg/dL (ref 0.2–1.2)

## 2016-04-02 LAB — CBC
HEMATOCRIT: 38.2 % (ref 36.0–46.0)
HEMOGLOBIN: 12.1 g/dL (ref 12.0–15.0)
MCHC: 31.6 g/dL (ref 30.0–36.0)
Platelets: 220 10*3/uL (ref 150.0–400.0)
RBC: 5.64 Mil/uL — AB (ref 3.87–5.11)
RDW: 16.1 % — ABNORMAL HIGH (ref 11.5–15.5)
WBC: 5.3 10*3/uL (ref 4.0–10.5)

## 2016-04-02 LAB — TSH: TSH: 3.16 u[IU]/mL (ref 0.35–4.50)

## 2016-04-02 LAB — LIPID PANEL
CHOLESTEROL: 136 mg/dL (ref 0–200)
HDL: 47.4 mg/dL (ref >39.00)
LDL Cholesterol: 62 mg/dL (ref 0–99)
NonHDL: 88.94
TRIGLYCERIDES: 137 mg/dL (ref 0.0–149.0)
Total CHOL/HDL Ratio: 3
VLDL: 27.4 mg/dL (ref 0.0–40.0)

## 2016-04-02 LAB — HEMOGLOBIN A1C: HEMOGLOBIN A1C: 6.8 % — AB (ref 4.6–6.5)

## 2016-04-02 NOTE — Patient Outreach (Signed)
Call to follow up with Ms. Gonsalez regarding her medication adherence and to review her medications. Apologize for having missed our phone call on 03/28/16. HIPAA identifiers verified and verbal consent received.Patient locates her medication bottles. Ms. Whisonant reports that she has been taking her amlodipine, atorvastatin, benazapril and furosemide as directed.   However, reports that she has been taking her carvedilol only once daily, rather than twice daily. Patient reports that it is difficult for her to remember to take her medication a second time. Discuss with patient the importance of blood pressure control and medication adherence. Explain to Ms. Pancake the importance of taking this medication twice daily based on how it works. Patient verbalizes understanding. Patient reports that her blood pressure was 123/80 this morning, does not recall the pulse. Reports that this is a pretty typical reading for her. Discuss with Ms. Balzarini the value of recording her blood pressure readings in a log and bringing this with her to each medical visit. Note that per EPIC her blood pressure was 128/90 with a pulse of 81 at her office visit with her PCP on 03/30/16. Offer to call patient's PCP today to let her know that Ms. Puerta has only been taking the carvedilol once daily and to determine, based on her available blood pressure readings, how her PCP would like for the patient to proceed. Ms. Walbridge states that she does not want me to call Dr. Charlett Blake as she would prefer to call to discuss this with Dr. Charlett Blake herself.   Ms. Runion reports that she has no further medication questions or concern.   Harlow Asa, PharmD Clinical Pharmacist [facility] [phone]

## 2016-04-03 ENCOUNTER — Ambulatory Visit (INDEPENDENT_AMBULATORY_CARE_PROVIDER_SITE_OTHER): Payer: Commercial Managed Care - HMO | Admitting: Podiatry

## 2016-04-03 DIAGNOSIS — T451X5A Adverse effect of antineoplastic and immunosuppressive drugs, initial encounter: Secondary | ICD-10-CM

## 2016-04-03 DIAGNOSIS — B351 Tinea unguium: Secondary | ICD-10-CM | POA: Diagnosis not present

## 2016-04-03 DIAGNOSIS — G620 Drug-induced polyneuropathy: Secondary | ICD-10-CM

## 2016-04-03 NOTE — Progress Notes (Signed)
Patient ID: Katelyn Fisher, female   DOB: Feb 29, 1951, 65 y.o.   MRN: [id number]    Subjective: This patient presents again for a schedule visit requesting debridement of long thick toenails. 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

## 2016-04-03 NOTE — Patient Instructions (Signed)
Diabetes and Foot Care Diabetes may cause you to have problems because of poor blood supply (circulation) to your feet and legs. This may cause the skin on your feet to become thinner, break easier, and heal more slowly. Your skin may become dry, and the skin may peel and crack. You may also have nerve damage in your legs and feet causing decreased feeling in them. You may not notice minor injuries to your feet that could lead to infections or more serious problems. Taking care of your feet is one of the most important things you can do for yourself.  HOME CARE INSTRUCTIONS  Wear shoes at all times, even in the house. Do not go barefoot. Bare feet are easily injured.  Check your feet daily for blisters, cuts, and redness. If you cannot see the bottom of your feet, use a mirror or ask someone for help.  Wash your feet with warm water (do not use hot water) and mild soap. Then pat your feet and the areas between your toes until they are completely dry. Do not soak your feet as this can dry your skin.  Apply a moisturizing lotion or petroleum jelly (that does not contain alcohol and is unscented) to the skin on your feet and to dry, brittle toenails. Do not apply lotion between your toes.  Trim your toenails straight across. Do not dig under them or around the cuticle. File the edges of your nails with an emery board or nail file.  Do not cut corns or calluses or try to remove them with medicine.  Wear clean socks or stockings every day. Make sure they are not too tight. Do not wear knee-high stockings since they may decrease blood flow to your legs.  Wear shoes that fit properly and have enough cushioning. To break in new shoes, wear them for just a few hours a day. This prevents you from injuring your feet. Always look in your shoes before you put them on to be sure there are no objects inside.  Do not cross your legs. This may decrease the blood flow to your feet.  If you find a minor scrape,  cut, or break in the skin on your feet, keep it and the skin around it clean and dry. These areas may be cleansed with mild soap and water. Do not cleanse the area with peroxide, alcohol, or iodine.  When you remove an adhesive bandage, be sure not to damage the skin around it.  If you have a wound, look at it several times a day to make sure it is healing.  Do not use heating pads or hot water bottles. They may burn your skin. If you have lost feeling in your feet or legs, you may not know it is happening until it is too late.  Make sure your health care provider performs a complete foot exam at least annually or more often if you have foot problems. Report any cuts, sores, or bruises to your health care provider immediately. SEEK MEDICAL CARE IF:   You have an injury that is not healing.  You have cuts or breaks in the skin.  You have an ingrown nail.  You notice redness on your legs or feet.  You feel burning or tingling in your legs or feet.  You have pain or cramps in your legs and feet.  Your legs or feet are numb.  Your feet always feel cold. SEEK IMMEDIATE MEDICAL CARE IF:   There is increasing redness,   swelling, or pain in or around a wound.  There is a red line that goes up your leg.  Pus is coming from a wound.  You develop a fever or as directed by your health care provider.  You notice a bad smell coming from an ulcer or wound.   This information is not intended to replace advice given to you by your health care provider. Make sure you discuss any questions you have with your health care provider.   Document Released: 07/06/2000 Document Revised: 03/11/2013 Document Reviewed: 12/16/2012 Elsevier Interactive Patient Education 2016 [employer]vier Inc.  

## 2016-04-08 NOTE — Assessment & Plan Note (Signed)
Avoid offending foods, take probiotics. Do not eat large meals in late evening and consider raising head of bed.  

## 2016-04-08 NOTE — Assessment & Plan Note (Signed)
On Levothyroxine, continue to monitor 

## 2016-04-08 NOTE — Progress Notes (Signed)
Patient ID: Katelyn Fisher, female   DOB: 1950-10-18, 65 y.o.   MRN: [id number]   Subjective:    Patient ID: Katelyn Fisher, female    DOB: 1950/10/03, 65 y.o.   MRN: [id number]  Chief Complaint  Patient presents with  . Annual Exam    HPI Patient is in today for annual physical exam and follow up on numerous medical exams. No recent illness or hospitalizations. Denies polyuria or polydipsia. Is struggling with right knee pain but is trying to stay active. Denies CP/palp/SOB/HA/congestion/fevers/GI or GU c/o. Taking meds as prescribed  Past Medical History:  Diagnosis Date  . Anxiety   . Arthritis    back  . Asthma   . Benign paroxysmal positional vertigo 11/01/2012  . Cancer [facility])    endometrial  . Cervical cancer screening 10/09/2010   Qualifier: Diagnosis of  By: Wynona Luna   . Depression   . Family history of breast cancer   . Family history of colon cancer   . Family history of ovarian cancer   . Family history of prostate cancer   . GERD (gastroesophageal reflux disease)   . History of colonic diverticulitis   . Hx of colonic polyps   . Hyperlipidemia   . Hypertension   . 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  . Low back pain radiating to right leg 08/18/2012  . Obesity   . Preventative health care 12/09/2013  . Radiation 12/17/14-01/03/15   vaginal brachytherapy  . Thyroid disease 06/01/2013    Past Surgical History:  Procedure Laterality Date  . ABDOMINAL HYSTERECTOMY  06/23/14   [facility], TRH/BSO  . APPENDECTOMY    . CHOLECYSTECTOMY  1991  . DILATION AND CURETTAGE OF UTERUS    . TUBAL LIGATION      Family History  Problem Relation Age of Onset  . Heart disease Maternal Aunt     x 3 -2 brothers  . Hypertension Maternal Aunt   . Breast cancer Maternal Aunt     maternal half; dx in her 16s  . Ovarian cancer Maternal Aunt     dx in her 56s  . Prostate cancer Father 50  . Diabetes Father   . Cancer Father     lung cancer, asbestos exposure  . Heart  disease Brother   . Diabetes Brother   . Other      no FH colon cancer  . Diabetes Mother   . Lupus Mother   . Heart disease Brother   . Hyperlipidemia Sister   . Heart disease Brother   . Diabetes Brother   . Colon polyps Brother   . Heart disease Brother   . Diabetes Brother   . Colon polyps Brother   . Colon cancer Maternal Grandmother     dx in her 27s  . Leukemia Maternal Uncle 21  . Cancer Paternal Aunt     NOS  . Prostate cancer Paternal Uncle     dx in his 5s  . Breast cancer Other     dx in her 18s    Social History   Social History  . Marital status: Married    Spouse name: N/A  . Number of children: N/A  . Years of education: N/A   Occupational History  . Not on file.   Social History Main Topics  . Smoking status: Never Smoker  . Smokeless tobacco: Never Used  . Alcohol use No  . Drug use: No  . Sexual activity: Yes    Partners:  Male     Comment: lives with husband, no dietary restrictions   G2 P 2   Other Topics Concern  . Not on file   Social History Narrative   Married- 97 years   Never Smoked   Alcohol use-no   Drug use-no   Occupation: housewife   Caffeine use/day:  None   Does Patient Exercise:  yes    Outpatient Medications Prior to Visit  Medication Sig Dispense Refill  . albuterol (PROAIR HFA) 108 (90 Base) MCG/ACT inhaler Inhale 2 puffs into the lungs every 4 (four) hours as needed for wheezing or shortness of breath. 1 Inhaler 2  . albuterol (PROVENTIL) (2.5 MG/3ML) 0.083% nebulizer solution Take 3 mLs (2.5 mg total) by nebulization every 6 (six) hours as needed for wheezing or shortness of breath. 150 mL 1  . amLODipine (NORVASC) 10 MG tablet TAKE 1 TABLET EVERY DAY 90 tablet 2  . aspirin 81 MG tablet Take 81 mg by mouth daily.      Marland Kitchen atorvastatin (LIPITOR) 20 MG tablet TAKE 0.5 TABLETS (10 MG TOTAL) BY MOUTH DAILY. 90 tablet 0  . benazepril (LOTENSIN) 40 MG tablet TAKE 1 TABLET EVERY DAY 90 tablet 2  . budesonide-formoterol  (SYMBICORT) 160-4.5 MCG/ACT inhaler TWO PUFFS TWICE A DAY TO PREVENT COUGH OR WHEEZE. RINSE, GARGLE AND SPIT AFTER USE. 1 Inhaler 5  . carvedilol (COREG) 12.5 MG tablet Take 1 tablet (12.5 mg total) by mouth 2 (two) times daily with a meal. 180 tablet 2  . diclofenac sodium (VOLTAREN) 1 % GEL Apply 2 g topically 4 (four) times daily as needed.     . diphenhydramine-acetaminophen (TYLENOL PM) 25-500 MG TABS Take 1 tablet by mouth at bedtime as needed. Reported on 01/30/2016    . ferrous fumarate (HEMOCYTE - 106 MG FE) 325 (106 Fe) MG TABS tablet Take 1 tablet daily on an empty stomach with OJ. 30 each 3  . fluticasone (FLONASE) 50 MCG/ACT nasal spray Place 2 sprays into the nose daily. 16 g 0  . furosemide (LASIX) 20 MG tablet Take 1-2 tablets (20-40 mg total) by mouth daily as needed for fluid. 120 tablet 1  . HYDROcodone-acetaminophen (NORCO) 10-325 MG per tablet Take 1 tablet by mouth 2 (two) times daily as needed. Reported on 09/05/2015  0  . levothyroxine (SYNTHROID, LEVOTHROID) 75 MCG tablet TAKE 1 TABLET (75 MCG TOTAL) BY MOUTH DAILY BEFORE BREAKFAST. 30 tablet 3  . Multiple Vitamin (MULTIVITAMIN) tablet Take 1 tablet by mouth daily.    . pantoprazole (PROTONIX) 40 MG tablet Take 1 tablet (40 mg total) by mouth daily. 90 tablet 2  . Probiotic Product (ALIGN) 4 MG CAPS Take 1 capsule by mouth daily.     . sennosides-docusate sodium (SENOKOT-S) 8.6-50 MG tablet Take 1 tablet by mouth daily as needed for constipation.    . triamcinolone (KENALOG) 0.1 % cream Apply 1 application topically. Apply topically to affected area once daily      No facility-administered medications prior to visit.     Allergies  Allergen Reactions  . Oxycodone Swelling    Facial swelling and tightness  . Penicillins Swelling    Rash with welps  . Shellfish Allergy Hives, Shortness Of Breath and Swelling  . Iodine     Rash  . Sulfa Antibiotics Rash    Review of Systems  Constitutional: Positive for  malaise/fatigue. Negative for chills and fever.  HENT: Negative for congestion and hearing loss.   Eyes: Negative for discharge.  Respiratory: Negative for cough, sputum production and shortness of breath.   Cardiovascular: Negative for chest pain, palpitations and leg swelling.  Gastrointestinal: Negative for abdominal pain, blood in stool, constipation, diarrhea, heartburn, nausea and vomiting.  Genitourinary: Negative for dysuria, frequency, hematuria and urgency.  Musculoskeletal: Positive for joint pain. Negative for back pain, falls and myalgias.  Skin: Negative for rash.  Neurological: Negative for dizziness, sensory change, loss of consciousness, weakness and headaches.  Endo/Heme/Allergies: Negative for environmental allergies. Does not bruise/bleed easily.  Psychiatric/Behavioral: Negative for depression and suicidal ideas. The patient is not nervous/anxious and does not have insomnia.        Objective:    Physical Exam  Constitutional: She is oriented to person, place, and time. She appears well-developed and well-nourished. No distress.  HENT:  Head: Normocephalic and atraumatic.  Eyes: Conjunctivae are normal.  Neck: Neck supple. No thyromegaly present.  Cardiovascular: Normal rate, regular rhythm and normal heart sounds.   Pulmonary/Chest: Effort normal and breath sounds normal. No respiratory distress.  Abdominal: Soft. Bowel sounds are normal. She exhibits no distension and no mass. There is no tenderness.  Musculoskeletal: She exhibits no edema.  Lymphadenopathy:    She has no cervical adenopathy.  Neurological: She is alert and oriented to person, place, and time.  Skin: Skin is warm and dry.  Psychiatric: She has a normal mood and affect. Her behavior is normal.    BP 128/90 (BP Location: Left Arm, Patient Position: Sitting, Cuff Size: Normal)   Pulse 81   Temp 97.6 F (36.4 C) (Oral)   Wt 244 lb 6.4 oz (110.9 kg)   SpO2 95%   BMI 45.58 kg/m  Wt Readings  from Last 3 Encounters:  03/30/16 244 lb 6.4 oz (110.9 kg)  03/12/16 244 lb 12.8 oz (111 kg)  01/30/16 239 lb (108.4 kg)     Lab Results  Component Value Date   WBC 5.3 04/02/2016   HGB 12.1 04/02/2016   HCT 38.2 04/02/2016   PLT 220.0 04/02/2016   GLUCOSE 119 (H) 04/02/2016   CHOL 136 04/02/2016   TRIG 137.0 04/02/2016   HDL 47.40 04/02/2016   LDLDIRECT 71.0 11/19/2014   LDLCALC 62 04/02/2016   ALT 17 04/02/2016   AST 18 04/02/2016   NA 141 04/02/2016   K 3.9 04/02/2016   CL 105 04/02/2016   CREATININE 0.70 04/02/2016   BUN 14 04/02/2016   CO2 30 04/02/2016   TSH 3.16 04/02/2016   INR 0.97 08/16/2014   HGBA1C 6.8 (H) 04/02/2016   MICROALBUR <0.7 12/29/2015    Lab Results  Component Value Date   TSH 3.16 04/02/2016   Lab Results  Component Value Date   WBC 5.3 04/02/2016   HGB 12.1 04/02/2016   HCT 38.2 04/02/2016   MCV 67.7 aL (L) 04/02/2016   PLT 220.0 04/02/2016   Lab Results  Component Value Date   NA 141 04/02/2016   K 3.9 04/02/2016   CHLORIDE 103 08/24/2015   CO2 30 04/02/2016   GLUCOSE 119 (H) 04/02/2016   BUN 14 04/02/2016   CREATININE 0.70 04/02/2016   BILITOT 0.3 04/02/2016   ALKPHOS 92 04/02/2016   AST 18 04/02/2016   ALT 17 04/02/2016   PROT 6.9 04/02/2016   ALBUMIN 3.8 04/02/2016   CALCIUM 9.1 04/02/2016   ANIONGAP 8 08/24/2015   EGFR 87 (L) 08/24/2015   GFR 108.25 04/02/2016   Lab Results  Component Value Date   CHOL 136 04/02/2016   Lab Results  Component Value Date   HDL 47.40 04/02/2016   Lab Results  Component Value Date   LDLCALC 62 04/02/2016   Lab Results  Component Value Date   TRIG 137.0 04/02/2016   Lab Results  Component Value Date   CHOLHDL 3 04/02/2016   Lab Results  Component Value Date   HGBA1C 6.8 (H) 04/02/2016       Assessment & Plan:   Problem List Items Addressed This Visit    Diabetes mellitus type 2, controlled ([place])    hgba1c acceptable, minimize simple carbs. Increase exercise as  tolerated.       Relevant Orders   CBC (Completed)   Comp Met (CMET) (Completed)   Hemoglobin A1c (Completed)   Lipid panel (Completed)   TSH (Completed)   Essential hypertension    Well controlled, no changes to meds. Encouraged heart healthy diet such as the DASH diet and exercise as tolerated.       Relevant Orders   CBC (Completed)   Comp Met (CMET) (Completed)   Hemoglobin A1c (Completed)   Lipid panel (Completed)   TSH (Completed)   GERD    Avoid offending foods, take probiotics. Do not eat large meals in late evening and consider raising head of bed.       Obstructive sleep apnea   Relevant Orders   CBC (Completed)   Comp Met (CMET) (Completed)   Hemoglobin A1c (Completed)   Lipid panel (Completed)   TSH (Completed)   Hyperlipidemia - Primary    Tolerating statin, encouraged heart healthy diet, avoid trans fats, minimize simple carbs and saturated fats. Increase exercise as tolerated      Relevant Orders   CBC (Completed)   Comp Met (CMET) (Completed)   Hemoglobin A1c (Completed)   Lipid panel (Completed)   TSH (Completed)   Thyroid disease    On Levothyroxine, continue to monitor      Relevant Orders   CBC (Completed)   Comp Met (CMET) (Completed)   Hemoglobin A1c (Completed)   Lipid panel (Completed)   TSH (Completed)   Preventative health care    Patient encouraged to maintain heart healthy diet, regular exercise, adequate sleep. Consider daily probiotics. Take medications as prescribed. Given and reviewed copy of ACP documents from [facility] Secretary of State and encouraged to complete and return      Endometrial cancer ([place])   Relevant Orders   CBC (Completed)   Comp Met (CMET) (Completed)   Hemoglobin A1c (Completed)   Lipid panel (Completed)   TSH (Completed)   Morbid obesity with body mass index of 40.0-44.9 in adult [facility])    Encouraged DASH diet, decrease po intake and increase exercise as tolerated. Needs 7-8 hours of sleep nightly. Avoid  trans fats, eat small, frequent meals every 4-5 hours with lean proteins, complex carbs and healthy fats. Minimize simple carbs       Other Visit Diagnoses    Encounter for immunization       Relevant Orders   Flu Vaccine QUAD 36+ mos IM (Completed)      I am having Ms. Stump maintain her aspirin, triamcinolone cream, multivitamin, fluticasone, ALIGN, diclofenac sodium, albuterol, pantoprazole, diphenhydramine-acetaminophen, sennosides-docusate sodium, HYDROcodone-acetaminophen, carvedilol, ferrous fumarate, benazepril, amLODipine, atorvastatin, furosemide, levothyroxine, albuterol, and budesonide-formoterol.  No orders of the defined types were placed in this encounter.    Penni Homans, MD

## 2016-04-08 NOTE — Assessment & Plan Note (Signed)
Well controlled, no changes to meds. Encouraged heart healthy diet such as the DASH diet and exercise as tolerated.  °

## 2016-04-08 NOTE — Assessment & Plan Note (Signed)
Tolerating statin, encouraged heart healthy diet, avoid trans fats, minimize simple carbs and saturated fats. Increase exercise as tolerated 

## 2016-04-08 NOTE — Assessment & Plan Note (Signed)
Encouraged DASH diet, decrease po intake and increase exercise as tolerated. Needs 7-8 hours of sleep nightly. Avoid trans fats, eat small, frequent meals every 4-5 hours with lean proteins, complex carbs and healthy fats. Minimize simple carbs 

## 2016-04-08 NOTE — Assessment & Plan Note (Signed)
Patient encouraged to maintain heart healthy diet, regular exercise, adequate sleep. Consider daily probiotics. Take medications as prescribed. Given and reviewed copy of ACP documents from [place] Secretary of State and encouraged to complete and return 

## 2016-04-12 ENCOUNTER — Other Ambulatory Visit: Payer: Self-pay | Admitting: Family Medicine

## 2016-04-12 ENCOUNTER — Telehealth: Payer: Self-pay

## 2016-04-12 MED ORDER — LEVOTHYROXINE SODIUM 75 MCG PO TABS: 75.0000 ug | ORAL_TABLET | Freq: Every day | ORAL | 1 refills | Status: DC

## 2016-04-12 NOTE — Telephone Encounter (Signed)
Rx for Levothyroxine 57mcg has been faxed to [facility] 04/12/16 [facility]

## 2016-04-24 ENCOUNTER — Telehealth: Payer: Self-pay | Admitting: Family Medicine

## 2016-04-24 NOTE — Telephone Encounter (Signed)
OK with me.

## 2016-04-24 NOTE — Telephone Encounter (Signed)
Patient would like to transfer her care from Dr. Charlett Blake to Dr. Nani Ravens. Patient states she has a hard time seeing Dr. Charlett Blake and she needs a provider that she can see when she needs to.

## 2016-04-25 ENCOUNTER — Other Ambulatory Visit: Payer: Self-pay | Admitting: Family Medicine

## 2016-04-25 NOTE — Telephone Encounter (Signed)
OK 

## 2016-04-25 NOTE — Telephone Encounter (Signed)
Patient notified that she will be able to transfer her care from Dr. Charlett Blake to Dr Nani Ravens. Patient will notify her insurance and then call back to schedule appointment with Dr Nani Ravens

## 2016-05-07 ENCOUNTER — Ambulatory Visit (HOSPITAL_BASED_OUTPATIENT_CLINIC_OR_DEPARTMENT_OTHER)
Admission: RE | Admit: 2016-05-07 | Discharge: 2016-05-07 | Disposition: A | Discharge status: Home / Self Care | Payer: Commercial Managed Care - HMO | Source: Ambulatory Visit | Attending: Family Medicine | Admitting: Family Medicine

## 2016-05-07 ENCOUNTER — Telehealth: Payer: Self-pay | Admitting: Family Medicine

## 2016-05-07 ENCOUNTER — Encounter: Payer: Self-pay | Admitting: Family Medicine

## 2016-05-07 ENCOUNTER — Ambulatory Visit (INDEPENDENT_AMBULATORY_CARE_PROVIDER_SITE_OTHER): Payer: Commercial Managed Care - HMO | Admitting: Family Medicine

## 2016-05-07 VITALS — BP 144/102 | HR 83 | Temp 98.5°F | Ht 62.0 in | Wt 247.0 lb

## 2016-05-07 DIAGNOSIS — M25512 Pain in left shoulder: Secondary | ICD-10-CM | POA: Insufficient documentation

## 2016-05-07 DIAGNOSIS — M1712 Unilateral primary osteoarthritis, left knee: Secondary | ICD-10-CM | POA: Diagnosis not present

## 2016-05-07 MED ORDER — METHYLPREDNISOLONE ACETATE 40 MG/ML IJ SUSP
40.0000 mg | Freq: Once | INTRAMUSCULAR | Status: AC
  Administered 2016-05-07: 40 mg

## 2016-05-07 MED ORDER — NAPROXEN 500 MG PO TABS: 500.0000 mg | ORAL_TABLET | Freq: Two times a day (BID) | ORAL | 0 refills | Status: DC

## 2016-05-07 MED ORDER — LIDOCAINE HCL (PF) 2 % IJ SOLN: 2.0000 mL | Freq: Once | INTRAMUSCULAR | Status: DC

## 2016-05-07 NOTE — Addendum Note (Signed)
Addended by: [place] Cellar on: 05/07/2016 02:07 PM   Modules accepted: Orders

## 2016-05-07 NOTE — Progress Notes (Signed)
Pre visit review using our clinic review tool, if applicable. No additional management support is needed unless otherwise documented below in the visit note. 

## 2016-05-07 NOTE — Telephone Encounter (Signed)
New prescription sent to [facility] by e-script, and prescription canceled at [facility].//AB/CMA

## 2016-05-07 NOTE — Progress Notes (Signed)
Musculoskeletal Exam  Patient: Katelyn Fisher DOB: 1950-10-25  DOS: 05/07/2016  SUBJECTIVE:  Chief Complaint:   Chief Complaint  Patient presents with  . Knee Pain    Pt reports having issues with knee and noticed swelling in the knee/ Pt reports having cortisone shots and recommended surgery for the knee   . Shoulder Pain    Pt reports she lifted some weights and think she pulled something/Pt reports taking with hydrocodone with no relief and wants to know what she can do     Katelyn Fisher is a 65 y.o.  female for evaluation and treatment of knee and shoulder pain.   Onset:  1 week ago. Lifting weights and not paying attention to how much weight was on it. She pushed up [employer]) and felt a sharp pain.  Location: Top of shoulder, L Character:  sharp  Progression of issue:  is unchanged Associated symptoms: none Treatment: to date has been Norco- no relief.   Neurovascular symptoms: no   Hx of R knee pain, used to get injections with ortho, copay is smaller here so she came here.   ROS: Musculoskeletal/Extremities: +R shoulder pain Neurologic: no numbness, tingling no weakness   Past Medical History:  Diagnosis Date  . Anxiety   . Arthritis    back  . Asthma   . Benign paroxysmal positional vertigo 11/01/2012  . Cancer (HCC)    endometrial  . Depression   . Family history of breast cancer   . Family history of colon cancer   . Family history of ovarian cancer   . Family history of prostate cancer   . GERD (gastroesophageal reflux disease)   . History of colonic diverticulitis   . Hx of colonic polyps   . Hyperlipidemia   . Hypertension   . 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  . Low back pain radiating to right leg 08/18/2012  . Obesity   . Radiation 12/17/14-01/03/15   vaginal brachytherapy  . Thyroid disease 06/01/2013   Past Surgical History:  Procedure Laterality Date  . ABDOMINAL HYSTERECTOMY  06/23/14   [facility], TRH/BSO  . APPENDECTOMY    .  CHOLECYSTECTOMY  1991  . DILATION AND CURETTAGE OF UTERUS    . TUBAL LIGATION     Family History  Problem Relation Age of Onset  . Heart disease Maternal Aunt     x 3 -2 brothers  . Hypertension Maternal Aunt   . Breast cancer Maternal Aunt     maternal half; dx in her 44s  . Ovarian cancer Maternal Aunt     dx in her 15s  . Prostate cancer Father 73  . Diabetes Father   . Cancer Father     lung cancer, asbestos exposure  . Heart disease Brother   . Diabetes Brother   . Other      no FH colon cancer  . Diabetes Mother   . Lupus Mother   . Heart disease Brother   . Hyperlipidemia Sister   . Heart disease Brother   . Diabetes Brother   . Colon polyps Brother   . Heart disease Brother   . Diabetes Brother   . Colon polyps Brother   . Colon cancer Maternal Grandmother     dx in her 64s  . Leukemia Maternal Uncle 21  . Cancer Paternal Aunt     NOS  . Prostate cancer Paternal Uncle     dx in his 74s  . Breast cancer Other  dx in her 40s   Current Outpatient Prescriptions  Medication Sig Dispense Refill  . albuterol (PROAIR HFA) 108 (90 Base) MCG/ACT inhaler Inhale 2 puffs into the lungs every 4 (four) hours as needed for wheezing or shortness of breath. 1 Inhaler 2  . albuterol (PROVENTIL) (2.5 MG/3ML) 0.083% nebulizer solution Take 3 mLs (2.5 mg total) by nebulization every 6 (six) hours as needed for wheezing or shortness of breath. 150 mL 1  . amLODipine (NORVASC) 10 MG tablet TAKE 1 TABLET EVERY DAY 90 tablet 2  . aspirin 81 MG tablet Take 81 mg by mouth daily.      Marland Kitchen atorvastatin (LIPITOR) 20 MG tablet TAKE 0.5 TABLETS (10 MG TOTAL) BY MOUTH DAILY. 90 tablet 0  . benazepril (LOTENSIN) 40 MG tablet TAKE 1 TABLET EVERY DAY 90 tablet 2  . budesonide-formoterol (SYMBICORT) 160-4.5 MCG/ACT inhaler TWO PUFFS TWICE A DAY TO PREVENT COUGH OR WHEEZE. RINSE, GARGLE AND SPIT AFTER USE. 1 Inhaler 5  . carvedilol (COREG) 12.5 MG tablet TAKE 1 TABLET (12.5 MG TOTAL) BY MOUTH 2  (TWO) TIMES DAILY WITH A MEAL. 180 tablet 2  . diclofenac sodium (VOLTAREN) 1 % GEL Apply 2 g topically 4 (four) times daily as needed.     . diphenhydramine-acetaminophen (TYLENOL PM) 25-500 MG TABS Take 1 tablet by mouth at bedtime as needed. Reported on 01/30/2016    . ferrous fumarate (HEMOCYTE - 106 MG FE) 325 (106 Fe) MG TABS tablet Take 1 tablet daily on an empty stomach with OJ. 30 each 3  . fluticasone (FLONASE) 50 MCG/ACT nasal spray Place 2 sprays into the nose daily. 16 g 0  . furosemide (LASIX) 20 MG tablet TAKE 1 TO 2 TABLETS EVERY DAY AS NEEDED  FOR  FLUID 120 tablet 0  . HYDROcodone-acetaminophen (NORCO) 10-325 MG per tablet Take 1 tablet by mouth 2 (two) times daily as needed. Reported on 09/05/2015  0  . levothyroxine (SYNTHROID, LEVOTHROID) 75 MCG tablet Take 1 tablet (75 mcg total) by mouth daily before breakfast. 90 tablet 1  . Multiple Vitamin (MULTIVITAMIN) tablet Take 1 tablet by mouth daily.    . pantoprazole (PROTONIX) 40 MG tablet Take 1 tablet (40 mg total) by mouth daily. 90 tablet 2  . Probiotic Product (ALIGN) 4 MG CAPS Take 1 capsule by mouth daily.     . sennosides-docusate sodium (SENOKOT-S) 8.6-50 MG tablet Take 1 tablet by mouth daily as needed for constipation.    . triamcinolone (KENALOG) 0.1 % cream Apply 1 application topically. Apply topically to affected area once daily      Allergies  Allergen Reactions  . Oxycodone Swelling    Facial swelling and tightness  . Penicillins Swelling    Rash with welps  . Shellfish Allergy Hives, Shortness Of Breath and Swelling  . Iodine     Rash  . Sulfa Antibiotics Rash   Social History   Social History  . Marital status: Married   Social History Main Topics  . Smoking status: Never Smoker  . Smokeless tobacco: Never Used  . Alcohol use No  . Drug use: No  . Sexual activity: Yes    Partners: Male     Comment: lives with husband, no dietary restrictions   G2 P 2   Social History Narrative   Married- 55  years   Never Smoked   Alcohol use-no   Drug use-no   Occupation: housewife   Caffeine use/day:  None   Does Patient Exercise:  yes  Objective: VITAL SIGNS: BP (!) 144/102 (BP Location: Left Arm, Patient Position: Sitting, Cuff Size: Large)   Pulse 83   Temp 98.5 F (36.9 C) (Oral)   Ht 5\' 2"  (1.575 m)   Wt 247 lb (112 kg)   SpO2 99%   BMI 45.18 kg/m  Constitutional: Well formed, well developed. No acute distress. Cardiovascular: Brisk cap refill Thorax & Lungs: No accessory muscle use Extremities: No clubbing. No cyanosis. No edema.  Skin: Warm. Dry. No erythema. No rash.  Musculoskeletal: L shoulder.   Normal active range of motion: no.   Normal passive range of motion: no Tenderness to palpation: yes Deformity: no Ecchymosis: no Tests positive: Cross Over, Cross over [place], [place] can, Neer's Tests negative: [place], [place], [place], Speeds Neurologic: Normal sensory function. No focal deficits noted. No clonus. Psychiatric: Normal mood. Age appropriate judgment and insight. Alert & oriented x 3.    Procedure Note; R Knee injection Consent obtained. The area of the antero-lateral joint line was palpated and cleaned with alcohol x1. A 25-gauge needle was used to enter the joint space anterolaterally with ease. 40 mg of Depomedrol with 2 mL of 1% lidocaine was injected. The patient tolerated the procedure well. There were no complications noted.  Assessment:  Acute pain of left shoulder - Plan: DG Shoulder Left, naproxen (NAPROSYN) 500 MG tablet  Osteoarthritis of left knee, unspecified osteoarthritis type - Plan: PR DRAIN/INJECT LARGE JOINT/BURSA  Plan: Orders as above. XR shows no impingement, fx or dislocation. Await final read. Will hold off on MRI as there is no weakness. Home stretches and exercises given. Avoid over the head exercises for now. Light weight when returning to gym. F/u in 1 week to recheck BP. The patient voiced understanding and  agreement to the plan.  [place], DO 05/07/16  10:43 AM

## 2016-05-07 NOTE — Patient Instructions (Addendum)
MEDICATION  If pain medication is necessary, nonsteroidal anti-inflammatory medications, such as aspirin and ibuprofen, or other minor pain relievers, such as acetaminophen, are often recommended.  Do not take pain medication for 7 days before surgery.  Prescription pain relievers may be given by your caregiver. Use only as directed and only as much as you need.  Ointments applied to the skin may be helpful.  Corticosteroid injections may be given to reduce inflammation. HEAT AND COLD  Cold treatment (icing) relieves pain and reduces inflammation. Cold treatment should be applied for 10 to 15 minutes every 2 to 3 hours for inflammation and pain and immediately after any activity that aggravates your symptoms. Use ice packs or an ice massage.  Heat treatment may be used prior to performing the stretching and strengthening activities prescribed by your caregiver, physical therapist or athletic trainer. Use a heat pack or a warm soak. SEEK IMMEDIATE MEDICAL CARE IF:   Pain, swelling or bruising worsens despite treatment.  There is pain, numbness or coldness in the arm.  Discoloration appears in the fingernails.  New, unexplained symptoms develop. EXERCISES  RANGE OF MOTION (ROM) AND STRETCHING EXERCISES - Acromioclavicular Separation These exercises may help you when beginning to rehabilitate your injury. Your symptoms may resolve with or without further involvement from your physician, physical therapist or athletic trainer. While completing these exercises, remember:  Restoring tissue flexibility helps normal motion to return to the joints. This allows healthier, less painful movement and activity.  An effective stretch should be held for at least 30 seconds. Repeat 2-3 times every day. For exercises, OK to do every 1-2 days.  A stretch should never be painful. You should only feel a gentle lengthening or release in the stretched tissue.  ROM - Pendulum  Bend at the waist so that  your right / left arm falls away from your body. Support yourself with your opposite hand on a solid surface, such as a table or a countertop.  Your right / left arm should be perpendicular to the ground. If it is not perpendicular, you need to lean over farther. Relax the muscles in your right / left arm and shoulder as much as possible.  Gently sway your hips and trunk so they move your right / left arm without any use of your right / left shoulder muscles.  Progress your movements so that your right / left arm moves side to side, then forward and backward, and finally, both clockwise and counterclockwise.  Complete __________ repetitions in each direction. Many people use this exercise to relieve discomfort in their shoulder as well as to gain range of motion. Repeat __________ times. Complete this exercise __________ times per day.  STRETCH - Flexion, Seated   Sit in a firm chair so that your right / left forearm can rest on a table or countertop. Your right / left elbow should rest below the height of your shoulder so that your shoulder feels supported and not tense or uncomfortable.  Keeping your right / left shoulder relaxed, lean forward at your waist, allowing your right / left hand to slide forward. Bend forward until you feel a moderate stretch in your shoulder, but before you feel an increase in your pain.  Hold __________ seconds. Slowly return to your starting position. Repeat __________ times. Complete this exercise __________ times per day.  STRETCH - Flexion, Standing  Stand with good posture. With an underhand grip on your right / left and an overhand grip on the opposite  hand, grasp a broomstick or cane so that your hands are a little more than shoulder-width apart.  Keeping your right / left elbow straight and shoulder muscles relaxed, push the stick with your opposite hand to raise your right / left arm in front of your body and then overhead. Raise your arm until you  feel a stretch in your right / left shoulder, but before you have increased shoulder pain.  Try to avoid shrugging your right / left shoulder as your arm rises by keeping your shoulder blade tucked down and toward your mid-back spine. Hold __________ seconds.  Slowly return to the starting position. Repeat __________ times. Complete this exercise __________ times per day. STRENGTHENING EXERCISES - Acromioclavicular Separation These exercises may help you when beginning to rehabilitate your injury. They may resolve your symptoms with or without further involvement from your physician, physical therapist or athletic trainer. While completing these exercises, remember:  Muscles can gain both the endurance and the strength needed for everyday activities through controlled exercises.  Complete these exercises as instructed by your physician, physical therapist or athletic trainer. Progress the resistance and repetitions only as guided.  You may experience muscle soreness or fatigue, but the pain or discomfort you are trying to eliminate should never worsen during these exercises. If this pain does worsen, stop and make certain you are following the directions exactly. If the pain is still present after adjustments, discontinue the exercise until you can discuss the trouble with your clinician.  STRENGTH - Shoulder Abductors, Isometric   With good posture, stand or sit about 4-6 inches from a wall with your right / left side facing the wall.  Bend your right / left elbow. Gently press your right / left elbow into the wall. Increase the pressure gradually until you are pressing as hard as you can without shrugging your shoulder or increasing any shoulder discomfort.  Hold __________ seconds.  Release the tension slowly. Relax your shoulder muscles completely before you start the next repetition. Repeat __________ times. Complete this exercise __________ times per day.  STRENGTH - Internal  Rotators, Isometric  Keep your right / left elbow at your side and bend it 90 degrees.  Step into a door frame so that the inside of your right / left wrist can press against the door frame without your upper arm leaving your side.  Gently press your right / left wrist into the door frame as if you were trying to draw the palm of your hand to your abdomen. Gradually increase the tension until you are pressing as hard as you can without shrugging your shoulder or increasing any shoulder discomfort.  Hold __________ seconds.  Release the tension slowly. Relax your shoulder muscles completely before you the next repetition. Repeat __________ times. Complete this exercise __________ times per day.   STRENGTH - External Rotators, Isometric  Keep your right / left elbow at your side and bend it 90 degrees.  Step into a door frame so that the outside of your right / left wrist can press against the door frame without your upper arm leaving your side.  Gently press your right / left wrist into the door frame as if you were trying to swing the back of your hand away from your abdomen. Gradually increase the tension until you are pressing as hard as you can without shrugging your shoulder or increasing any shoulder discomfort.  Hold __________ seconds.  Release the tension slowly. Relax your shoulder muscles completely before you  the next repetition. Repeat __________ times. Complete this exercise __________ times per day.  STRENGTH - Internal Rotators  Secure a rubber exercise band/tubing to a fixed object so that it is at the same height as your right / left elbow when you are standing or sitting on a firm surface.  Stand or sit so that the secured exercise band/tubing is at your right / left side.  Bend your elbow 90 degrees. Place a folded towel or small pillow under your right / left arm so that your elbow is a few inches away from your side.  Keeping the tension on the exercise  band/tubing, pull it across your body toward your abdomen. Be sure to keep your body steady so that the movement is only coming from your shoulder rotating.  Hold __________ seconds. Release the tension in a controlled manner as you return to the starting position. Repeat __________ times. Complete this exercise __________ times per day.  STRENGTH - External Rotators  Secure a rubber exercise band/tubing to a fixed object so that it is at the same height as your right / left elbow when you are standing or sitting on a firm surface.  Stand or sit so that the secured exercise band/tubing is at your side that is not injured.  Bend your elbow 90 degrees. Place a folded towel or small pillow under your right / left arm so that your elbow is a few inches away from your side.  Keeping the tension on the exercise band/tubing, pull it away from your body, as if pivoting on your elbow. Be sure to keep your body steady so that the movement is only coming from your shoulder rotating.  Hold __________ seconds. Release the tension in a controlled manner as you return to the starting position. Repeat __________ times. Complete this exercise __________ times per day.

## 2016-05-07 NOTE — Addendum Note (Signed)
Addended by: Yell Cellar on: 05/07/2016 03:01 PM   Modules accepted: Orders

## 2016-05-07 NOTE — Addendum Note (Signed)
Addended by: [place] Cellar on: 05/07/2016 12:06 PM   Modules accepted: Orders

## 2016-05-07 NOTE — Telephone Encounter (Signed)
Caller name: [place] Relationship to patient: self Can be reached: [phone] Pharmacy: [facility] [phone] [facility] [place] [place]  Reason for call: pt needs naproxen sent to local pharmacy, it was sent to mail order for a short term order. Please call to cancel at [place].

## 2016-05-08 NOTE — Progress Notes (Signed)
GYNECOLOGIC ONCOLOGY OFFICE NOTE   Chief Complaint: Uterine serous endometrial cancer. Surveillance   Assessment/Plan: Katelyn Fisher is a 65 y.o. woman with pre op diagnosis of grade 1 endometrial cancer . 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   6//6 cycles adjuvant platinum and taxane chemotherapy and  vaginal brachytherapy completed 12/2014 No evidence of disease Follow-up with Gyn Onc May 2018 Follow-up with Dr. Dellis Filbert every 2018. Pap test should be collected at that visit  History of Present Illness: Katelyn Fisher is a 65 y.o. who presented with a two-week history of postmenopausal bleeding. A pelvic ultrasound showed fibroids and a thickened endometrial stripe. She subsequently underwent an endometrial biopsy which was concerning for at least grade 1 endometrioid adenocarcinoma.   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 US showed fibroids and thickened EMS. EMB showed "at least grade 1 endometriod adenoca" on EMB by primary gynecologist.    06/23/2014  Surgery  RATLH, BSO, BPLND. Findings: enlarged fibroid uterus, no e/o mets. IOFS: grade 1, focal LVSI. Final path: mixed serous (50%) and endometrioid (50%), 2.0cm size, 17% invasion, no cervical involvement, +LVSI, all pelvic LN neg.     Pathology: A: Uterus, cervix, bilateral ovaries and fallopian tubes, total hysterectomy and bilateral salpingo-oophorectomy  Histologic type: mixed high grade carcinoma (serous carcinoma 50%, endometrioid adenocarcinoma 50%)   Tumor size (gross): 2.0 cm (microscopic measurement)   Myometrial invasion: Inner half Depth: 5 mm Wall thickness: 3 cm Percent: 17% Lymphovascular space invasion: present   Endometrium: focal endometrial intraepithelial carcinoma  (serous in situ carcinoma) predominantly   - Six lymph nodes negative for metastatic carcinoma (0/6)   C: Lymph nodes, left pelvic, excision  - Nine lymph nodes, negative for metastatic carcinoma (0/9)   Post op PET to assess LN without evidence of metastatic disease.   6 cycles taxol/Carbo completed 01/2015.   Radiation Treatment Dates: 12/17/14-01/03/15 Site/Dose: HDR Brachytherapy with 30 Gy in 5 fractions Extended cancer gene panel negative. Mammogram 11/2014 wnl  CT 03/03/2015 IMPRESSION: 1. Interval hysterectomy. No evidence of local recurrence or metastatic disease. 2. No acute abdominal pelvic findings. 3. Distal colonic diverticulosis with possible rectal wall thickening, possibly related to prior radiation therapy. Correlate clinically. Pap test in February  2017 within normal limits  Doing well.  No complaints.  No vaginal bleeding, normal mammogram earlier this year , no abdominal bloating shortness of breath   Past Medical History:  Past Medical History   Past Medical History   Diagnosis  Date   .  Asthma    .  Morbid obesity (RAF-HCC)      BMI 44   .  Hypertension    .  GERD (gastroesophageal reflux disease)    .  Arthritis    .  Depression    .  Back pain    .  Thyroid disease  05/2013   .  Vertigo  10/2012     benign paroxysmal positional       Past Surgical History:  Past Surgical History   Past Surgical History   Procedure  Laterality  Date   .  Dilation and curettage of uterus   2102     for PMB   .  Hysteroscopy   2012     with resection of  fibroids   .  Tubal ligation     .  Laparoscopic cholecystectomy     .  Pr laparoscopy w tot hysterectuterus <=250 gram w tube/ovary  Midline  06/23/2014     Procedure: ROBOTIC LAPAROSCOPY, SURGICAL, WITH TOTAL HYSTERECTOMY, FOR UTERUS 250 G OR LESS; W/REMOVAL TUBE(S) AND/OR OVARY(S); Surgeon: Devra Dopp, MD; Location: MAIN OR [facility]; Service: Gynecology Oncology   .  Pr lap,pelvic lymphadenectomy/bx  Bilateral   06/23/2014     Procedure: ROBOTIC LAPAROSCOPY, SURGICAL; WITH BILATERAL TOTAL PELVIC LYMPHADENECTOMY PERI-AORTIC LYMPH NODE SAMPLE, SGL/MULT; Surgeon: Devra Dopp, MD; Location: [facility]; Service: Gynecology Oncology       Family History:  Family History   Family History   Problem  Relation  Age of Onset   .  Heart disease  Maternal Aunt    .  Lung cancer  Father    .  Cancer  Maternal Aunt  15     Breast   .  Cancer  Maternal Aunt  90     Breast   .  Diabetes  Mother    .  Lupus  Mother    .  Diabetes  Father    .  Prostate cancer  Father    .  Diabetes  Brother    .  Heart disease  Brother      ROS: No visual changes, no nausea or vomiting, no sensory or motor changes, no flank pain, no extremity edema, no incontinence, no vaginal bleeding. No change in bowel or bladder habits, Otherwise 10 point ROS negative.   Physical Exam: BP 135/83 (BP Location: Left Arm, Patient Position: Sitting)   Pulse 72   Temp 97.8 F (36.6 C) (Oral)   Resp 19   Wt 245 lb 8 oz (111.4 kg)   SpO2 100%   BMI 44.90 kg/m   Wt Readings from Last 3 Encounters:  05/07/16 247 lb (112 kg)  03/30/16 244 lb 6.4 oz (110.9 kg)  03/12/16 244 lb 12.8 oz (111 kg)   WD female in NAD Chest: CTA Back: No CVAT Abdomen: Obese. Soft, nondistended, nontender to palpation. No masses or hepatosplenomegaly appreciated.Port sites intact, no tenderness or masses  GU/Abdominal: Normal external female genitalia. No lesions. Cuff intact, no bleeding or discharge Pelvic:  Nl EGBUS, no massess, no discharge no bleeding.  No cul de sac masses. Rectal:  Good tone no masses. Back:  No CVAT LN:  No cervical supraclavicular or inguinal adenopathy Ext No CCE

## 2016-05-09 ENCOUNTER — Other Ambulatory Visit: Payer: Self-pay

## 2016-05-09 DIAGNOSIS — C541 Malignant neoplasm of endometrium: Secondary | ICD-10-CM

## 2016-05-10 ENCOUNTER — Ambulatory Visit (HOSPITAL_BASED_OUTPATIENT_CLINIC_OR_DEPARTMENT_OTHER): Payer: Commercial Managed Care - HMO

## 2016-05-10 ENCOUNTER — Encounter: Payer: Self-pay | Admitting: Gynecologic Oncology

## 2016-05-10 ENCOUNTER — Other Ambulatory Visit (HOSPITAL_BASED_OUTPATIENT_CLINIC_OR_DEPARTMENT_OTHER): Payer: Commercial Managed Care - HMO

## 2016-05-10 ENCOUNTER — Telehealth: Payer: Self-pay | Admitting: Family Medicine

## 2016-05-10 ENCOUNTER — Ambulatory Visit: Payer: Commercial Managed Care - HMO | Attending: Gynecologic Oncology | Admitting: Gynecologic Oncology

## 2016-05-10 VITALS — BP 135/83 | HR 72 | Temp 97.8°F | Resp 19 | Wt 245.5 lb

## 2016-05-10 DIAGNOSIS — Z8542 Personal history of malignant neoplasm of other parts of uterus: Secondary | ICD-10-CM

## 2016-05-10 DIAGNOSIS — G620 Drug-induced polyneuropathy: Secondary | ICD-10-CM

## 2016-05-10 DIAGNOSIS — T451X5A Adverse effect of antineoplastic and immunosuppressive drugs, initial encounter: Secondary | ICD-10-CM

## 2016-05-10 DIAGNOSIS — C541 Malignant neoplasm of endometrium: Secondary | ICD-10-CM

## 2016-05-10 DIAGNOSIS — Z95828 Presence of other vascular implants and grafts: Secondary | ICD-10-CM

## 2016-05-10 LAB — CBC WITH DIFFERENTIAL/PLATELET
BASO%: 0.2 % (ref 0.0–2.0)
Basophils Absolute: 0 10*3/uL (ref 0.0–0.1)
EOS ABS: 0.1 10*3/uL (ref 0.0–0.5)
EOS%: 1.2 % (ref 0.0–7.0)
HEMATOCRIT: 37.7 % (ref 34.8–46.6)
HGB: 11.6 g/dL (ref 11.6–15.9)
LYMPH%: 38.5 % (ref 14.0–49.7)
MCH: 20.8 pg — ABNORMAL LOW (ref 25.1–34.0)
MCHC: 30.8 g/dL — ABNORMAL LOW (ref 31.5–36.0)
MCV: 67.6 fL — AB (ref 79.5–101.0)
MONO#: 0.6 10*3/uL (ref 0.1–0.9)
MONO%: 10.8 % (ref 0.0–14.0)
NEUT%: 49.3 % (ref 38.4–76.8)
NEUTROS ABS: 2.9 10*3/uL (ref 1.5–6.5)
PLATELETS: 201 10*3/uL (ref 145–400)
RBC: 5.58 10*6/uL — AB (ref 3.70–5.45)
RDW: 16 % — ABNORMAL HIGH (ref 11.2–14.5)
WBC: 5.9 10*3/uL (ref 3.9–10.3)
lymph#: 2.3 10*3/uL (ref 0.9–3.3)

## 2016-05-10 LAB — COMPREHENSIVE METABOLIC PANEL
ALK PHOS: 104 U/L (ref 40–150)
ALT: 21 U/L (ref 0–55)
ANION GAP: 9 meq/L (ref 3–11)
AST: 18 U/L (ref 5–34)
Albumin: 3.6 g/dL (ref 3.5–5.0)
BILIRUBIN TOTAL: 0.22 mg/dL (ref 0.20–1.20)
BUN: 16.6 mg/dL (ref 7.0–26.0)
CALCIUM: 9.2 mg/dL (ref 8.4–10.4)
CO2: 27 meq/L (ref 22–29)
Chloride: 107 mEq/L (ref 98–109)
Creatinine: 0.8 mg/dL (ref 0.6–1.1)
Glucose: 110 mg/dl (ref 70–140)
Potassium: 3.5 mEq/L (ref 3.5–5.1)
Sodium: 144 mEq/L (ref 136–145)
TOTAL PROTEIN: 6.9 g/dL (ref 6.4–8.3)

## 2016-05-10 MED ORDER — SODIUM CHLORIDE 0.9 % IJ SOLN
10.0000 mL | INTRAMUSCULAR | Status: DC | PRN
  Administered 2016-05-10: 10 mL via INTRAVENOUS
  Filled 2016-05-10: qty 10

## 2016-05-10 MED ORDER — HEPARIN SOD (PORK) LOCK FLUSH 100 UNIT/ML IV SOLN
500.0000 [IU] | Freq: Once | INTRAVENOUS | Status: AC | PRN
  Administered 2016-05-10: 500 [IU] via INTRAVENOUS
  Filled 2016-05-10: qty 5

## 2016-05-10 NOTE — Patient Instructions (Signed)
Follow up with Dr Dellis Filbert in February 2018 as recommended . Follow up with Dr Janie Morning on May 10 , 2018 at 10:30 AM , call with any changes, questions or concerns.  Thank you

## 2016-05-10 NOTE — Telephone Encounter (Signed)
Pt says that she went to [facility] today, this morning at 8:15a. She says that she had her BP checked then it was 135/83   She would like to cancel appt w/ nurse for today to have bp checked due to. But says that if provider still needs her to come in please advise and she will come in one day next week   Please advise for scheduling.

## 2016-05-11 ENCOUNTER — Telehealth: Payer: Self-pay

## 2016-05-11 NOTE — Telephone Encounter (Signed)
I'm OK with that. TY.

## 2016-05-11 NOTE — Telephone Encounter (Signed)
S/w pt per Dr Edwyna Shell labs are stable.

## 2016-05-11 NOTE — Telephone Encounter (Signed)
-----   Message from Gordy Levan, MD sent at 05/11/2016 12:51 PM EDT ----- Labs seen and need follow up: please let her know labs stable

## 2016-05-14 NOTE — Telephone Encounter (Signed)
Called and spoke with the pt on (Friday-05/11/16), and informed her of the note below.  Pt verbalized understanding and agreed.//AB/CMA

## 2016-05-30 ENCOUNTER — Telehealth: Payer: Self-pay | Admitting: Family Medicine

## 2016-05-30 NOTE — Telephone Encounter (Signed)
OK with me.

## 2016-05-30 NOTE — Telephone Encounter (Signed)
OK 

## 2016-05-30 NOTE — Telephone Encounter (Signed)
Patient would like to transfer from Dr. Charlett Blake to Dr. Nani Ravens please advise

## 2016-05-31 ENCOUNTER — Ambulatory Visit (INDEPENDENT_AMBULATORY_CARE_PROVIDER_SITE_OTHER): Payer: Commercial Managed Care - HMO | Admitting: Family Medicine

## 2016-05-31 ENCOUNTER — Ambulatory Visit: Payer: Commercial Managed Care - HMO | Admitting: Family Medicine

## 2016-05-31 ENCOUNTER — Encounter: Payer: Self-pay | Admitting: Family Medicine

## 2016-05-31 VITALS — BP 160/98 | HR 77 | Temp 97.9°F | Ht 62.0 in | Wt 242.2 lb

## 2016-05-31 DIAGNOSIS — J209 Acute bronchitis, unspecified: Secondary | ICD-10-CM | POA: Diagnosis not present

## 2016-05-31 DIAGNOSIS — I10 Essential (primary) hypertension: Secondary | ICD-10-CM | POA: Diagnosis not present

## 2016-05-31 MED ORDER — BECLOMETHASONE DIPROPIONATE 40 MCG/ACT IN AERS: 2.0000 | INHALATION_SPRAY | Freq: Two times a day (BID) | RESPIRATORY_TRACT | 2 refills | Status: DC

## 2016-05-31 MED ORDER — FLUTICASONE PROPIONATE HFA 110 MCG/ACT IN AERO: 2.0000 | INHALATION_SPRAY | Freq: Every day | RESPIRATORY_TRACT | 1 refills | Status: DC

## 2016-05-31 MED ORDER — BENZONATATE 100 MG PO CAPS: 100.0000 mg | ORAL_CAPSULE | Freq: Three times a day (TID) | ORAL | 0 refills | Status: DC | PRN

## 2016-05-31 NOTE — Addendum Note (Signed)
Addended by: Eureka Cellar on: 05/31/2016 02:48 PM   Modules accepted: Orders

## 2016-05-31 NOTE — Patient Instructions (Signed)
Claritin (loratadine), Allegra (fexofenadine), Zyrtec (cetirizine); these are listed in order from weakest to strongest. Generic, and therefore cheaper, options are in the parentheses.   Flonase (fluticasone); nasal spray that is over the counter. 2 sprays each nostril, once daily. Aim towards the same side eye when you spray.  There are available OTC, and the generic versions, which may be cheaper, are in parentheses. Show this to a pharmacist if you have trouble finding any of these items.  

## 2016-05-31 NOTE — Progress Notes (Signed)
Pre visit review using our clinic review tool, if applicable. No additional management support is needed unless otherwise documented below in the visit note. 

## 2016-05-31 NOTE — Telephone Encounter (Signed)
Patient scheduled for 05/31/2016

## 2016-05-31 NOTE — Progress Notes (Signed)
Chief Complaint  Patient presents with  . Cough    Pt reports cough x1 week with soreness , no mucus , runny nose     Katelyn Fisher here for URI complaints.  Duration: 1 week  Associated symptoms: cough, runny nose, ST, nasal congestion  Denies: fevers, ear pain/drainage, chest pain Treatment to date: Albuterol, cough syrup, Tylenol sinus Sick contacts: No  ROS:  Const: Denies fevers HEENT: As noted in HPI Lungs: No SOB  Past Medical History:  Diagnosis Date  . Anxiety   . Arthritis    back  . Asthma   . Benign paroxysmal positional vertigo 11/01/2012  . Cancer (HCC)    endometrial  . Depression   . Family history of breast cancer   . Family history of colon cancer   . Family history of ovarian cancer   . Family history of prostate cancer   . GERD (gastroesophageal reflux disease)   . History of colonic diverticulitis   . Hx of colonic polyps   . Hyperlipidemia   . Hypertension   . 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  . Low back pain radiating to right leg 08/18/2012  . Obesity   . Radiation 12/17/14-01/03/15   vaginal brachytherapy  . Thyroid disease 06/01/2013   Family History  Problem Relation Age of Onset  . Heart disease Maternal Aunt     x 3 -2 brothers  . Hypertension Maternal Aunt   . Breast cancer Maternal Aunt     maternal half; dx in her 31s  . Ovarian cancer Maternal Aunt     dx in her 47s  . Prostate cancer Father 65  . Diabetes Father   . Cancer Father     lung cancer, asbestos exposure  . Heart disease Brother   . Diabetes Brother   . Diabetes Mother   . Lupus Mother   . Heart disease Brother   . Hyperlipidemia Sister   . Heart disease Brother   . Diabetes Brother   . Colon polyps Brother   . Heart disease Brother   . Diabetes Brother   . Colon polyps Brother   . Colon cancer Maternal Grandmother     dx in her 82s  . Leukemia Maternal Uncle 21  . Cancer Paternal Aunt     NOS  . Prostate cancer Paternal Uncle     dx in his 43s  .  Breast cancer Other     dx in her 89s  . Other      no FH colon cancer    BP (!) 160/98 (BP Location: Left Arm, Patient Position: Sitting, Cuff Size: Large)   Pulse 77   Temp 97.9 F (36.6 C) (Oral)   Ht 5\' 2"  (1.575 m)   Wt 242 lb 3.2 oz (109.9 kg)   SpO2 98%   BMI 44.30 kg/m  General: Awake, alert, appears stated age HEENT: AT, [place], ears patent b/l and TM's neg, nares patent w/o discharge, pharynx pink and without exudates, MMM Neck: No masses or asymmetry Heart: RRR, no murmurs, no bruits Lungs: CTAB, no accessory muscle use Psych: Age appropriate judgment and insight, normal mood and affect  Acute bronchitis, unspecified organism - Plan: benzonatate (TESSALON) 100 MG capsule, beclomethasone (QVAR) 40 MCG/ACT inhaler  Essential hypertension  Orders as above. Asthma also appears to be acting up with illness, will call in Qvar  F/u in 4 weeks for a nurse visit to recheck BP. Pt voiced understanding and agreement to the plan.  [place]by Oyster Snyder,  DO 05/31/16 9:42 AM

## 2016-06-04 ENCOUNTER — Other Ambulatory Visit: Payer: Self-pay

## 2016-06-26 ENCOUNTER — Ambulatory Visit (INDEPENDENT_AMBULATORY_CARE_PROVIDER_SITE_OTHER): Payer: Commercial Managed Care - HMO | Admitting: Family Medicine

## 2016-06-26 VITALS — BP 154/83 | HR 81

## 2016-06-26 DIAGNOSIS — I10 Essential (primary) hypertension: Secondary | ICD-10-CM | POA: Diagnosis not present

## 2016-06-26 NOTE — Progress Notes (Signed)
Pre visit review using our clinic review tool, if applicable. No additional management support is needed unless otherwise documented below in the visit note.  Patient presents in clinic for blood pressure check. Medications & regimen reviewed with the patient. Today's readings were as follow: BP 150/97 P 82 & BP 154/83 P 81.  Per Dr. Charlett Blake: Increase Carvedilol to 12.5 MG three times daily. Continue current regimen with all other medications. Return in 3-4 weeks for nurse visit to have blood pressure rechecked. Schedule annual physical with PCP in 2 months.  Informed patient of the provider's instructions. She verbalized understanding. Next appointment scheduled for 07/24/16 at 9:30 AM.  RN blood pressure check note reviewed. Agree with documention and plan.

## 2016-06-26 NOTE — Patient Instructions (Addendum)
Per Dr. Charlett Blake: Increase Carvedilol to 12.5 MG three times daily. Continue current regimen with all other medications. Return in 3-4 weeks for nurse visit to have blood pressure rechecked. Schedule annual physical with PCP in 2 months.

## 2016-06-26 NOTE — Progress Notes (Signed)
Nurseblood pressure check note reviewed. Agree with documention and plan. 

## 2016-06-28 ENCOUNTER — Encounter: Payer: Self-pay | Admitting: Family Medicine

## 2016-06-29 ENCOUNTER — Other Ambulatory Visit: Payer: Self-pay | Admitting: Family Medicine

## 2016-06-29 DIAGNOSIS — J069 Acute upper respiratory infection, unspecified: Secondary | ICD-10-CM

## 2016-06-29 MED ORDER — HYDROCOD POLST-CPM POLST ER 10-8 MG/5ML PO SUER: 5.0000 mL | Freq: Two times a day (BID) | ORAL | 0 refills | Status: DC | PRN

## 2016-07-03 ENCOUNTER — Other Ambulatory Visit: Payer: Self-pay | Admitting: Family Medicine

## 2016-07-03 ENCOUNTER — Ambulatory Visit (INDEPENDENT_AMBULATORY_CARE_PROVIDER_SITE_OTHER): Payer: Commercial Managed Care - HMO | Admitting: Podiatry

## 2016-07-03 ENCOUNTER — Encounter: Payer: Self-pay | Admitting: Podiatry

## 2016-07-03 VITALS — BP 97/72 | HR 84 | Ht 63.0 in | Wt 224.0 lb

## 2016-07-03 DIAGNOSIS — T451X5A Adverse effect of antineoplastic and immunosuppressive drugs, initial encounter: Secondary | ICD-10-CM

## 2016-07-03 DIAGNOSIS — J209 Acute bronchitis, unspecified: Secondary | ICD-10-CM

## 2016-07-03 DIAGNOSIS — B351 Tinea unguium: Secondary | ICD-10-CM | POA: Diagnosis not present

## 2016-07-03 DIAGNOSIS — G620 Drug-induced polyneuropathy: Secondary | ICD-10-CM

## 2016-07-03 NOTE — Telephone Encounter (Signed)
Should patient come in to be seen if symptoms are not getting any better? Please advise. TL/CAM

## 2016-07-03 NOTE — Patient Instructions (Signed)

## 2016-07-03 NOTE — Progress Notes (Signed)
Patient ID: Katelyn Fisher, female   DOB: Dec 01, 1950, 65 y.o.   MRN: [id number]    Subjective: This patient presents again for a schedule visit requesting debridement of long thick toenails. 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

## 2016-07-04 NOTE — Telephone Encounter (Signed)
OK to give one more refill. If she is still having issues after it runs out, she needs to be seen. TY.

## 2016-07-05 ENCOUNTER — Other Ambulatory Visit: Payer: Self-pay | Admitting: Family Medicine

## 2016-07-05 ENCOUNTER — Ambulatory Visit (HOSPITAL_BASED_OUTPATIENT_CLINIC_OR_DEPARTMENT_OTHER): Payer: Commercial Managed Care - HMO

## 2016-07-05 DIAGNOSIS — C541 Malignant neoplasm of endometrium: Secondary | ICD-10-CM

## 2016-07-05 DIAGNOSIS — Z452 Encounter for adjustment and management of vascular access device: Secondary | ICD-10-CM

## 2016-07-05 DIAGNOSIS — J209 Acute bronchitis, unspecified: Secondary | ICD-10-CM

## 2016-07-05 DIAGNOSIS — Z95828 Presence of other vascular implants and grafts: Secondary | ICD-10-CM

## 2016-07-05 MED ORDER — BENZONATATE 100 MG PO CAPS: 100.0000 mg | ORAL_CAPSULE | Freq: Three times a day (TID) | ORAL | 0 refills | Status: DC | PRN

## 2016-07-05 MED ORDER — SODIUM CHLORIDE 0.9 % IJ SOLN
10.0000 mL | INTRAMUSCULAR | Status: DC | PRN
  Administered 2016-07-05: 10 mL via INTRAVENOUS
  Filled 2016-07-05: qty 10

## 2016-07-05 MED ORDER — HEPARIN SOD (PORK) LOCK FLUSH 100 UNIT/ML IV SOLN
500.0000 [IU] | Freq: Once | INTRAVENOUS | Status: AC | PRN
  Administered 2016-07-05: 500 [IU] via INTRAVENOUS
  Filled 2016-07-05: qty 5

## 2016-07-09 NOTE — Telephone Encounter (Signed)
Rx already filled on 07/05/16. TL/CMA

## 2016-07-12 ENCOUNTER — Ambulatory Visit (INDEPENDENT_AMBULATORY_CARE_PROVIDER_SITE_OTHER): Payer: Commercial Managed Care - HMO | Admitting: Family Medicine

## 2016-07-12 ENCOUNTER — Encounter: Payer: Self-pay | Admitting: Family Medicine

## 2016-07-12 VITALS — BP 128/78 | HR 84 | Temp 97.7°F | Ht 63.0 in | Wt 242.8 lb

## 2016-07-12 DIAGNOSIS — J189 Pneumonia, unspecified organism: Secondary | ICD-10-CM

## 2016-07-12 MED ORDER — BENZONATATE 100 MG PO CAPS: 100.0000 mg | ORAL_CAPSULE | Freq: Three times a day (TID) | ORAL | 0 refills | Status: DC | PRN

## 2016-07-12 MED ORDER — AZITHROMYCIN 250 MG PO TABS: ORAL_TABLET | ORAL | 0 refills | Status: DC

## 2016-07-12 NOTE — Progress Notes (Signed)
Chief Complaint  Patient presents with  . Cough    Pt reports having cough for about a month it went away and then came back     [employer] here for URI complaints.  Duration: 3 weeks  Associated symptoms: sinus congestion, rhinorrhea, sore throat and cough Denies: subjective fever, rhinorrhea, ear pain, ear drainage, shortness of breath and myalgia Treatment to date: Flonsae, Tessalon Perle's, Zyrtec Sick contacts: No  ROS:  Const: Denies fevers HEENT: As noted in HPI Lungs: No SOB  Past Medical History:  Diagnosis Date  . Anxiety   . Arthritis    back  . Asthma   . Benign paroxysmal positional vertigo 11/01/2012  . Cancer (HCC)    endometrial  . Depression   . Family history of breast cancer   . Family history of colon cancer   . Family history of ovarian cancer   . Family history of prostate cancer   . GERD (gastroesophageal reflux disease)   . History of colonic diverticulitis   . Hx of colonic polyps   . Hyperlipidemia   . Hypertension   . 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  . Low back pain radiating to right leg 08/18/2012  . Obesity   . Radiation 12/17/14-01/03/15   vaginal brachytherapy  . Thyroid disease 06/01/2013   Family History  Problem Relation Age of Onset  . Heart disease Maternal Aunt     x 3 -2 brothers  . Hypertension Maternal Aunt   . Breast cancer Maternal Aunt     maternal half; dx in her 66s  . Ovarian cancer Maternal Aunt     dx in her 26s  . Prostate cancer Father 43  . Diabetes Father   . Cancer Father     lung cancer, asbestos exposure  . Heart disease Brother   . Diabetes Brother   . Diabetes Mother   . Lupus Mother   . Heart disease Brother   . Hyperlipidemia Sister   . Heart disease Brother   . Diabetes Brother   . Colon polyps Brother   . Heart disease Brother   . Diabetes Brother   . Colon polyps Brother   . Colon cancer Maternal Grandmother     dx in her 58s  . Leukemia Maternal Uncle 21  . Cancer Paternal Aunt      NOS  . Prostate cancer Paternal Uncle     dx in his 40s  . Breast cancer Other     dx in her 6s  . Other      no FH colon cancer    BP 128/78 (BP Location: Right Arm, Patient Position: Sitting, Cuff Size: Large)   Pulse 84   Temp 97.7 F (36.5 C) (Oral)   Ht 5\' 3"  (1.6 m)   Wt 242 lb 12.8 oz (110.1 kg)   SpO2 98%   BMI 43.01 kg/m  General: Awake, alert, appears stated age HEENT: AT, [place], ears patent b/l and TM's neg, nares patent w/o discharge, Turbinates appear enlarged, more on the L, pharynx pink and without exudates, MMM Neck: No masses or asymmetry Heart: RRR, no murmurs, no bruits Lungs: CTAB, no accessory muscle use Psych: Age appropriate judgment and insight, normal mood and affect  Walking pneumonia - Plan: benzonatate (TESSALON) 100 MG capsule, azithromycin (ZITHROMAX) 250 MG tablet  Orders as above. Wait a few days before taking abx, only if worsening or not improving given duration. Continue supportive care. F/u in 1 week if symptoms worsen or fail to  improve.  Pt voiced understanding and agreement to the plan.  [place], DO 07/12/16 8:21 AM

## 2016-07-12 NOTE — Patient Instructions (Signed)
Continue supportive care. Don't take the antibiotic until Saturday evening or Sunday morning. Only take it if you are not improving or are getting worse.  Seek more emergent care if you start having shortness of breath or fevers.

## 2016-07-12 NOTE — Progress Notes (Signed)
Pre visit review using our clinic review tool, if applicable. No additional management support is needed unless otherwise documented below in the visit note. 

## 2016-07-25 ENCOUNTER — Other Ambulatory Visit: Payer: Self-pay | Admitting: Nurse Practitioner

## 2016-07-28 DIAGNOSIS — J4540 Moderate persistent asthma, uncomplicated: Secondary | ICD-10-CM | POA: Diagnosis not present

## 2016-07-30 ENCOUNTER — Encounter: Payer: Self-pay | Admitting: Pediatrics

## 2016-07-30 ENCOUNTER — Ambulatory Visit (INDEPENDENT_AMBULATORY_CARE_PROVIDER_SITE_OTHER): Payer: Medicare PPO | Admitting: Pediatrics

## 2016-07-30 VITALS — BP 126/82 | HR 80 | Temp 98.3°F | Resp 21 | Ht 62.0 in | Wt 241.0 lb

## 2016-07-30 DIAGNOSIS — J4520 Mild intermittent asthma, uncomplicated: Secondary | ICD-10-CM | POA: Diagnosis not present

## 2016-07-30 DIAGNOSIS — K219 Gastro-esophageal reflux disease without esophagitis: Secondary | ICD-10-CM

## 2016-07-30 DIAGNOSIS — J45909 Unspecified asthma, uncomplicated: Secondary | ICD-10-CM | POA: Diagnosis not present

## 2016-07-30 DIAGNOSIS — C541 Malignant neoplasm of endometrium: Secondary | ICD-10-CM | POA: Diagnosis not present

## 2016-07-30 MED ORDER — ALBUTEROL SULFATE (2.5 MG/3ML) 0.083% IN NEBU: 2.5000 mg | INHALATION_SOLUTION | RESPIRATORY_TRACT | 2 refills | Status: DC | PRN

## 2016-07-30 MED ORDER — TRIAMCINOLONE ACETONIDE 0.1 % EX CREA: TOPICAL_CREAM | CUTANEOUS | 3 refills | Status: DC

## 2016-07-30 NOTE — Patient Instructions (Addendum)
Fluticasone 2 sprays per nostril once a day if needed for stuffy nose Pro-air 2 puffs every 4 hours if needed for wheezing or coughing spells or instead albuterol 0.083% one unit dose every 4 hours if needed Symbicort 160-2 puffs every 12 hours if needed to prevent coughing or wheezing Continue pantoprazole 40 mg once a day Triamcinolone 0.1% cream twice a day if needed to red itchy areas below the face Continue on her other medications Call me if you are not doing well on this treatment plan  This patient does not have acute bronchitis. The EMR associated acute bronchitis with a prescription of albuterol. The problem is with the EMR

## 2016-07-30 NOTE — Progress Notes (Addendum)
[place] [address] Dept: [phone]  FOLLOW UP NOTE  Patient ID: Katelyn Fisher, female    DOB: 05/03/1951  Age: 66 y.o. MRN: [id number] Date of Office Visit: 07/30/2016  Assessment  Chief Complaint: Asthma (doing well)  HPI [name] presents for follow-up of asthma and allergic rhinitis. Her asthma has been well controlled and she does not have to use Symbicort 160 on a daily basis. She had a respiratory tract infection around Christmastime and was given a Zithromax pack.. Her nasal symptoms are under control. Her endometrial cancer is stable.  Current medications are Symbicort 160-2 puffs every 12 hours if needed, Pro-air 2 puffs every 4 hours if needed, albuterol 0.083% one unit dose every 4 hours if needed, fluticasone 2 sprays per nostril once a day if needed, pantoprazole 40 mg once a day, triamcinolone 0.1% cream once a day if needed to red itchy areas below the face. Her other medications are outlined in the chart.   Drug Allergies:  Allergies  Allergen Reactions  . Oxycodone Swelling    Facial swelling and tightness  . Penicillins Swelling    Rash with welps  . Shellfish Allergy Hives, Shortness Of Breath and Swelling  . Iodine     Rash  . Sulfa Antibiotics Rash    Physical Exam: BP 126/82   Pulse 80   Temp 98.3 F (36.8 C) (Oral)   Resp (!) 21   Ht 5\' 2"  (1.575 m)   Wt 241 lb (109.3 kg)   SpO2 97%   BMI 44.08 kg/m    Physical Exam  Constitutional: She is oriented to person, place, and time. She appears well-developed and well-nourished.  HENT:  Eyes normal. Ears normal. Nose normal. Pharynx normal.  Neck: Neck supple.  Cardiovascular:  S1 and S2 normal no murmurs  Pulmonary/Chest:  Clear to percussion and auscultation  Lymphadenopathy:    She has no cervical adenopathy.  Neurological: She is alert and oriented to person, place, and time.  Psychiatric: She has a normal mood and affect. Her behavior is normal. Judgment  and thought content normal.  Vitals reviewed.   Diagnostics:  FVC 1.76 L FEV1 1.51 L. Predicted FVC 2.93 L predicted FEV1 2.24 L-this shows a moderate reduction in the forced vital capacity  Assessment and Plan: 1. Mild intermittent asthma without complication   2. Asthma with bronchitis   3. Gastroesophageal reflux disease without esophagitis   4. Endometrial cancer ([place])     Meds ordered this encounter  Medications  . albuterol (PROVENTIL) (2.5 MG/3ML) 0.083% nebulizer solution    Sig: Take 3 mLs (2.5 mg total) by nebulization every 4 (four) hours as needed for wheezing or shortness of breath.    Dispense:  150 mL    Refill:  2  . triamcinolone cream (KENALOG) 0.1 %    Sig: Apply twice daily as needed to affected areas below face    Dispense:  30 g    Refill:  3    Patient Instructions  Fluticasone 2 sprays per nostril once a day if needed for stuffy nose Pro-air 2 puffs every 4 hours if needed for wheezing or coughing spells or instead albuterol 0.083% one unit dose every 4 hours if needed Symbicort 160-2 puffs every 12 hours if needed to prevent coughing or wheezing Continue pantoprazole 40 mg once a day Triamcinolone 0.1% cream twice a day if needed to red itchy areas below the face Continue on her other medications Call me if  you are not doing well on this treatment plan  This patient does not have acute bronchitis. The EMR associated acute bronchitis with a prescription of albuterol. The problem is with the EMR   Return in about 6 months (around 01/27/2017).    Thank you for the opportunity to care for this patient.  Please do not hesitate to contact me with questions.  Penne Lash, M.D.  [facility] [address] [place], [place] [address] [phone]

## 2016-07-31 DIAGNOSIS — J4520 Mild intermittent asthma, uncomplicated: Secondary | ICD-10-CM | POA: Insufficient documentation

## 2016-08-02 ENCOUNTER — Ambulatory Visit: Payer: Commercial Managed Care - HMO | Admitting: Family Medicine

## 2016-08-13 ENCOUNTER — Encounter: Payer: Self-pay | Admitting: *Deleted

## 2016-08-27 ENCOUNTER — Encounter: Payer: Self-pay | Admitting: Internal Medicine

## 2016-08-30 ENCOUNTER — Ambulatory Visit (HOSPITAL_BASED_OUTPATIENT_CLINIC_OR_DEPARTMENT_OTHER): Payer: Medicare PPO

## 2016-08-30 VITALS — BP 156/93 | HR 88 | Temp 98.4°F | Resp 18

## 2016-08-30 DIAGNOSIS — C541 Malignant neoplasm of endometrium: Secondary | ICD-10-CM

## 2016-08-30 DIAGNOSIS — Z95828 Presence of other vascular implants and grafts: Secondary | ICD-10-CM

## 2016-08-30 DIAGNOSIS — Z452 Encounter for adjustment and management of vascular access device: Secondary | ICD-10-CM

## 2016-08-30 MED ORDER — HEPARIN SOD (PORK) LOCK FLUSH 100 UNIT/ML IV SOLN
500.0000 [IU] | Freq: Once | INTRAVENOUS | Status: AC | PRN
  Administered 2016-08-30: 500 [IU] via INTRAVENOUS
  Filled 2016-08-30: qty 5

## 2016-08-30 MED ORDER — SODIUM CHLORIDE 0.9% FLUSH
10.0000 mL | INTRAVENOUS | Status: DC | PRN
  Administered 2016-08-30: 10 mL via INTRAVENOUS
  Filled 2016-08-30: qty 10

## 2016-08-31 ENCOUNTER — Ambulatory Visit
Admission: RE | Admit: 2016-08-31 | Discharge: 2016-08-31 | Disposition: A | Discharge status: Home / Self Care | Payer: Commercial Managed Care - HMO | Source: Ambulatory Visit | Attending: Radiation Oncology | Admitting: Radiation Oncology

## 2016-09-10 ENCOUNTER — Encounter: Payer: Self-pay | Admitting: Family Medicine

## 2016-09-10 ENCOUNTER — Ambulatory Visit (INDEPENDENT_AMBULATORY_CARE_PROVIDER_SITE_OTHER): Payer: Medicare PPO | Admitting: Family Medicine

## 2016-09-10 VITALS — BP 120/60 | HR 99 | Temp 98.0°F | Ht 62.0 in | Wt 248.8 lb

## 2016-09-10 DIAGNOSIS — M1711 Unilateral primary osteoarthritis, right knee: Secondary | ICD-10-CM | POA: Diagnosis not present

## 2016-09-10 DIAGNOSIS — Z01419 Encounter for gynecological examination (general) (routine) without abnormal findings: Secondary | ICD-10-CM | POA: Diagnosis not present

## 2016-09-10 MED ORDER — METHYLPREDNISOLONE ACETATE 40 MG/ML IJ SUSP
40.0000 mg | Freq: Once | INTRAMUSCULAR | Status: AC
  Administered 2016-09-10: 40 mg via INTRA_ARTICULAR

## 2016-09-10 NOTE — Progress Notes (Signed)
Chief Complaint  Patient presents with  . Knee Pain    Pt reports RT kneepain had relief from last shot but has started to wear off and feelsshe needs another one   Pt has had R knee injected in past, would like it injected again.  BP 120/60 (BP Location: Left Arm, Patient Position: Sitting, Cuff Size: Large)   Pulse 99   Temp 98 F (36.7 C) (Oral)   Ht 5\' 2"  (1.575 m)   Wt 248 lb 12.8 oz (112.9 kg)   SpO2 99%   BMI 45.51 kg/m   Procedure Note; Knee injection Verbal consent obtained. The area of the antero-lateral joint line was palpated and cleaned with alcohol x1. A 27-gauge needle was used to enter the joint space anterolaterally with ease. 40 mg of Depomedrol with 2 mL of 1% lidocaine was injected. The patient tolerated the procedure well. There were no complications noted.   Osteoarthritis of right knee, unspecified osteoarthritis type - Plan: PR DRAIN/INJECT LARGE JOINT/BURSA  Orders as above.  F/u as originally scheduled. Pt voiced understanding and agreement to the plan.  [place], [place] 10:49 AM 09/10/16

## 2016-09-10 NOTE — Addendum Note (Signed)
Addended by: [place] Cellar on: 09/10/2016 10:54 AM   Modules accepted: Orders

## 2016-09-10 NOTE — Progress Notes (Signed)
Pre visit review using our clinic review tool, if applicable. No additional management support is needed unless otherwise documented below in the visit note. 

## 2016-09-18 ENCOUNTER — Encounter: Payer: Self-pay | Admitting: Internal Medicine

## 2016-09-24 ENCOUNTER — Encounter: Payer: Self-pay | Admitting: Family Medicine

## 2016-09-24 ENCOUNTER — Ambulatory Visit (INDEPENDENT_AMBULATORY_CARE_PROVIDER_SITE_OTHER): Payer: Medicare PPO | Admitting: Family Medicine

## 2016-09-24 VITALS — BP 122/82 | HR 102 | Temp 97.9°F | Ht 62.0 in | Wt 248.8 lb

## 2016-09-24 DIAGNOSIS — E1169 Type 2 diabetes mellitus with other specified complication: Secondary | ICD-10-CM | POA: Diagnosis not present

## 2016-09-24 DIAGNOSIS — E669 Obesity, unspecified: Secondary | ICD-10-CM | POA: Diagnosis not present

## 2016-09-24 DIAGNOSIS — Z23 Encounter for immunization: Secondary | ICD-10-CM | POA: Diagnosis not present

## 2016-09-24 DIAGNOSIS — Z0000 Encounter for general adult medical examination without abnormal findings: Secondary | ICD-10-CM

## 2016-09-24 LAB — COMPREHENSIVE METABOLIC PANEL
ALT: 19 U/L (ref 0–35)
AST: 18 U/L (ref 0–37)
Albumin: 4 g/dL (ref 3.5–5.2)
Alkaline Phosphatase: 117 U/L (ref 39–117)
BILIRUBIN TOTAL: 0.3 mg/dL (ref 0.2–1.2)
BUN: 8 mg/dL (ref 6–23)
CALCIUM: 9.4 mg/dL (ref 8.4–10.5)
CO2: 26 meq/L (ref 19–32)
Chloride: 107 mEq/L (ref 96–112)
Creatinine, Ser: 0.76 mg/dL (ref 0.40–1.20)
GFR: 98.3 mL/min (ref >60.00)
Glucose, Bld: 120 mg/dL — ABNORMAL HIGH (ref 70–99)
Potassium: 4.4 mEq/L (ref 3.5–5.1)
Sodium: 143 mEq/L (ref 135–145)
Total Protein: 7.2 g/dL (ref 6.0–8.3)

## 2016-09-24 LAB — HIV ANTIBODY (ROUTINE TESTING W REFLEX): HIV: NONREACTIVE

## 2016-09-24 LAB — LIPID PANEL
CHOL/HDL RATIO: 3
Cholesterol: 157 mg/dL (ref 0–200)
HDL: 51.9 mg/dL (ref >39.00)
LDL CALC: 87 mg/dL (ref 0–99)
NONHDL: 105.32
Triglycerides: 90 mg/dL (ref 0.0–149.0)
VLDL: 18 mg/dL (ref 0.0–40.0)

## 2016-09-24 LAB — MICROALBUMIN / CREATININE URINE RATIO
Creatinine,U: 19.4 mg/dL
Microalb Creat Ratio: 3.6 mg/g (ref 0.0–30.0)

## 2016-09-24 LAB — CBC
HCT: 39.8 % (ref 36.0–46.0)
Hemoglobin: 12.3 g/dL (ref 12.0–15.0)
MCHC: 30.9 g/dL (ref 30.0–36.0)
MCV: 67.7 fl — AB (ref 78.0–100.0)
Platelets: 214 10*3/uL (ref 150.0–400.0)
RBC: 5.88 Mil/uL — ABNORMAL HIGH (ref 3.87–5.11)
RDW: 16.6 % — AB (ref 11.5–15.5)
WBC: 5.8 10*3/uL (ref 4.0–10.5)

## 2016-09-24 LAB — HEMOGLOBIN A1C: Hgb A1c MFr Bld: 7.1 % — ABNORMAL HIGH (ref 4.6–6.5)

## 2016-09-24 LAB — HEPATITIS C ANTIBODY: HCV AB: NEGATIVE

## 2016-09-24 MED ORDER — ATORVASTATIN CALCIUM 40 MG PO TABS: 40.0000 mg | ORAL_TABLET | Freq: Every day | ORAL | 3 refills | Status: DC

## 2016-09-24 NOTE — Progress Notes (Signed)
Chief Complaint  Patient presents with  . Annual Exam    fasting     Well Woman Katelyn Fisher is here for a complete physical.   Her last physical was >1 year ago.  Current diet: in general, a "healthy" diet  . Current exercise: walks intermittently. Weight is stable and she denies daytime fatigue. Her last pap smear was normal.  Still has paps. No LMP recorded. Patient has had a hysterectomy..  Follow with a Dentist? Yes.   Follow with an Optometrist? Yes.   Home safety concerns? No. Seatbelt? Yes Pt has never been told she has diabetes. We discussed this- she does get yearly eye exams and is due again this July. She is on an ACEi and statin.  Maintenance Tetanus- 09/2007 Flu- yes Hep C- Never HIV- Never Colonoscopy- 10/2011 with 5 year f/u - Has scheduled Mammogram- 10/2015, nml Pap- Hysterectomy-  Follows with GYN  Past Medical History:  Diagnosis Date  . Anxiety   . Arthritis    back  . Asthma   . Benign paroxysmal positional vertigo 11/01/2012  . Cancer (HCC)    endometrial  . Depression   . Family history of breast cancer   . Family history of colon cancer   . Family history of ovarian cancer   . Family history of prostate cancer   . GERD (gastroesophageal reflux disease)   . History of colonic diverticulitis   . Hx of colonic polyps   . Hyperlipidemia   . Hypertension   . 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  . Low back pain radiating to right leg 08/18/2012  . Obesity   . Radiation 12/17/14-01/03/15   vaginal brachytherapy  . Thyroid disease 06/01/2013    Past Surgical History:  Procedure Laterality Date  . ABDOMINAL HYSTERECTOMY  06/23/14   [facility], TR[facility]/BSO  . APPENDECTOMY    . CHOLECYSTECTOMY  1991  . DILATION AND CURETTAGE OF UTERUS    . TUBAL LIGATION     Medications  Current Outpatient Prescriptions on File Prior to Visit  Medication Sig Dispense Refill  . albuterol (PROAIR HFA) 108 (90 Base) MCG/ACT inhaler Inhale 2 puffs into the lungs every 4  (four) hours as needed for wheezing or shortness of breath. 1 Inhaler 2  . albuterol (PROVENTIL) (2.5 MG/3ML) 0.083% nebulizer solution Take 3 mLs (2.5 mg total) by nebulization every 4 (four) hours as needed for wheezing or shortness of breath. 150 mL 2  . amLODipine (NORVASC) 10 MG tablet TAKE 1 TABLET EVERY DAY 90 tablet 2  . aspirin 81 MG tablet Take 81 mg by mouth daily.      Marland Kitchen atorvastatin (LIPITOR) 20 MG tablet TAKE HALF A TABLET (10 MG TOTAL) BY MOUTH DAILY. 90 tablet 0  . benazepril (LOTENSIN) 40 MG tablet TAKE 1 TABLET EVERY DAY 90 tablet 2  . budesonide-formoterol (SYMBICORT) 160-4.5 MCG/ACT inhaler TWO PUFFS TWICE A DAY TO PREVENT COUGH OR WHEEZE. RINSE, GARGLE AND SPIT AFTER USE. 1 Inhaler 5  . carvedilol (COREG) 12.5 MG tablet TAKE 1 TABLET (12.5 MG TOTAL) BY MOUTH 2 (TWO) TIMES DAILY WITH A MEAL. (Patient taking differently: Take 12.5 mg by mouth 3 (three) times daily. ) 180 tablet 2  . chlorpheniramine-HYDROcodone (TUSSIONEX PENNKINETIC ER) 10-8 MG/5ML SUER Take 5 mLs by mouth every 12 (twelve) hours as needed for cough. 120 mL 0  . diclofenac sodium (VOLTAREN) 1 % GEL Apply 2 g topically 4 (four) times daily as needed.     . diphenhydramine-acetaminophen (TYLENOL PM) 25-500  MG TABS Take 1 tablet by mouth at bedtime as needed. Reported on 01/30/2016    . fluticasone (FLOVENT HFA) 110 MCG/ACT inhaler Inhale 2 puffs into the lungs daily. 1 Inhaler 1  . furosemide (LASIX) 20 MG tablet TAKE 1 TO 2 TABLETS EVERY DAY AS NEEDED  FOR  FLUID 120 tablet 0  . levothyroxine (SYNTHROID, LEVOTHROID) 75 MCG tablet Take 1 tablet (75 mcg total) by mouth daily before breakfast. 90 tablet 1  . Multiple Vitamin (MULTIVITAMIN) tablet Take 1 tablet by mouth daily.    . naproxen (NAPROSYN) 500 MG tablet Take 1 tablet (500 mg total) by mouth 2 (two) times daily with a meal. 20 tablet 0  . pantoprazole (PROTONIX) 40 MG tablet Take 1 tablet (40 mg total) by mouth daily. 90 tablet 2  . Probiotic Product (ALIGN)  4 MG CAPS Take 1 capsule by mouth daily.     Marland Kitchen triamcinolone cream (KENALOG) 0.1 % Apply twice daily as needed to affected areas below face 30 g 3   Allergies Allergies  Allergen Reactions  . Oxycodone Swelling    Facial swelling and tightness  . Penicillins Swelling    Rash with welps  . Shellfish Allergy Hives, Shortness Of Breath and Swelling  . Iodine     Rash  . Sulfa Antibiotics Rash    Review of Systems: Constitutional:  no unexpected change in weight, no weakness, no unexplained fevers, sweats, or chills Eye:  no recent significant change in vision Ear/Nose/Mouth/Throat:  Ears:  no tinnitus or vertigo and no recent change in hearing, Nose/Mouth/Throat:  no complaints of nasal congestion or discharge, no sore throat and no recent change in voice or hoarseness Cardiovascular:  no exercise intolerance, no chest pain, no palpitations Respiratory:  no chronic cough, sputum, or hemoptysis and no shortness of breath Gastrointestinal:  no abdominal pain, no change in bowel habits, no significant change in appetite, no nausea, vomiting, diarrhea, or constipation and no black or bloody stool GU:  Female: negative for dysuria, frequency, and incontinence, Normal menses; no abnormal bleeding, pelvic pain, or discharge Musculoskeletal/Extremities:  +R knee pain, otherwise no pain, redness, or swelling of the joints Integumentary (Skin/Breast):  no abnormal skin lesions reported, no new breast lumps or masses Neurologic:  no chronic headaches, no numbness, tingling, or tremor Psychiatric:  no anxiety, no depression Endocrine:  denies fatigue, weight changes, heat/cold intolerance, bowel or skin changes, or cardiovascular system symptoms Hematologic/Lymphatic:  no abnormal bleeding, no HIV risk factors, no night sweats, no swollen nodes, no weight loss Allergic/Immunologic:  no history of food or environmental allergies  Exam BP 122/82 (BP Location: Left Arm, Patient Position: Sitting, Cuff  Size: Large)   Pulse (!) 102   Temp 97.9 F (36.6 C) (Oral)   Ht 5\' 2"  (1.575 m)   Wt 248 lb 12.8 oz (112.9 kg)   SpO2 98%   BMI 45.51 kg/m  General:  well developed, well nourished, in no apparent distress Skin:  no significant moles, warts, or growths Head:  no masses, lesions, or tenderness Eyes:  pupils equal and round, sclera anicteric without injection Ears:  canals without lesions, TMs shiny without retraction, no obvious effusion, no erythema Nose:  nares patent, septum midline, mucosa normal, and no drainage or sinus tenderness Throat/Pharynx:  lips and gingiva without lesion; tongue and uvula midline; non-inflamed pharynx; no exudates or postnasal drainage Neck: neck supple without adenopathy, thyromegaly, or masses Thorax:  nontender Lungs:  clear to auscultation, breath sounds equal bilaterally, no  respiratory distress Cardio:  regular rate and rhythm without murmurs, heart sounds without clicks or rubs, point of maximal impulse normal; no lifts, heaves, or thrills Abdomen:  abdomen soft, nontender; bowel sounds normal; no masses or organomegaly Genital: not done Musculoskeletal:  symmetrical muscle groups noted without atrophy or deformity Extremities:  no clubbing, cyanosis, or edema, no deformities, no skin discoloration Neuro:  gait normal; deep tendon reflexes normal and symmetric Psych: well oriented with normal range of affect and appropriate judgment/insight  Assessment and Plan  Well adult exam - Plan: Microalbumin / creatinine urine ratio, Lipid panel, HIV antibody, Hepatitis C antibody, Hemoglobin A1c, Comprehensive metabolic panel, CBC  Diabetes mellitus type 2 in obese (HCC) - Plan: atorvastatin (LIPITOR) 40 MG tablet, Microalbumin / creatinine urine ratio, Hemoglobin A1c, CBC   Well 66 y.o. female. Counseled on diet and exercise. Encouraged to lift weights. Will increase dose of Lipitor given DM dx. Glucometer provided today. Don't need to check sugars  routinely until A1c comes back.  Discussed knee pain. Recommend icing, Tylenol, ibuprofen, PT and injections. She does not want a replacement.  Immunizations, labs, and further orders as above. Follow up pending the above workup. The patient voiced understanding and agreement to the plan.  [place], DO 09/24/16 9:22 AM

## 2016-09-24 NOTE — Addendum Note (Signed)
Addended by: Harl Bowie on: 09/24/2016 10:17 AM   Modules accepted: Orders

## 2016-09-24 NOTE — Patient Instructions (Signed)
We will schedule a f/u appointment based on your labs.   Take 2 tabs of Lipitor until you run out.  A new prescription at the higher dose has been called in.  Consider lifting weights!  [phone] for information on weight loss medicine.

## 2016-09-24 NOTE — Progress Notes (Signed)
Pre visit review using our clinic review tool, if applicable. No additional management support is needed unless otherwise documented below in the visit note. 

## 2016-09-26 ENCOUNTER — Ambulatory Visit
Admission: RE | Admit: 2016-09-26 | Discharge: 2016-09-26 | Disposition: A | Discharge status: Home / Self Care | Payer: Medicare PPO | Source: Ambulatory Visit | Attending: Radiation Oncology | Admitting: Radiation Oncology

## 2016-09-26 ENCOUNTER — Encounter: Payer: Self-pay | Admitting: Radiation Oncology

## 2016-09-26 ENCOUNTER — Other Ambulatory Visit: Payer: Self-pay | Admitting: Hematology and Oncology

## 2016-09-26 DIAGNOSIS — Z885 Allergy status to narcotic agent status: Secondary | ICD-10-CM | POA: Insufficient documentation

## 2016-09-26 DIAGNOSIS — Z880 Allergy status to penicillin: Secondary | ICD-10-CM | POA: Insufficient documentation

## 2016-09-26 DIAGNOSIS — Z8542 Personal history of malignant neoplasm of other parts of uterus: Secondary | ICD-10-CM | POA: Diagnosis not present

## 2016-09-26 DIAGNOSIS — Z08 Encounter for follow-up examination after completed treatment for malignant neoplasm: Secondary | ICD-10-CM | POA: Diagnosis not present

## 2016-09-26 DIAGNOSIS — Z7982 Long term (current) use of aspirin: Secondary | ICD-10-CM | POA: Insufficient documentation

## 2016-09-26 DIAGNOSIS — Z79899 Other long term (current) drug therapy: Secondary | ICD-10-CM | POA: Diagnosis not present

## 2016-09-26 DIAGNOSIS — C541 Malignant neoplasm of endometrium: Secondary | ICD-10-CM | POA: Diagnosis not present

## 2016-09-26 NOTE — Progress Notes (Signed)
Katelyn Fisher is here for follow up of [place] Brachytherapy radiation completed 01/03/15. She denies. She denies current urinary or bowel problems. She denies vaginal discharge. She reports seeing a small spot of blood on her underwear about a month ago. She reports to be using a vaginal dilator at times. Her last PAP smear was in February of this year performed by Dr. Dellis Filbert, and was reported as normal per the patient. She will see Dr. Skeet Latch in May of this year. She has an appointment with Dr. Alvy Bimler tomorrow for medical oncology follow up.  BP (!) 144/93   Pulse 88   Temp 97.9 F (36.6 C)   Ht 5\' 2"  (1.575 m)   Wt 249 lb 12.8 oz (113.3 kg)   SpO2 100% Comment: room air  BMI 45.69 kg/m    Wt Readings from Last 3 Encounters:  09/26/16 249 lb 12.8 oz (113.3 kg)  09/24/16 248 lb 12.8 oz (112.9 kg)  09/10/16 248 lb 12.8 oz (112.9 kg)

## 2016-09-26 NOTE — Progress Notes (Signed)
Radiation Oncology         [phone] ________________________________  Name: Katelyn Fisher MRN: [id number]  Date: 09/26/2016  DOB: 1950/10/13  Follow-Up Visit Note  Outpatient  CC: Shelda Pal, DO  Janie Morning, MD  Diagnosis and Prior Radiotherapy:    ICD-9-CM ICD-10-CM   1. 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 She denies current urinary problems, bowel problems, or vaginal discharge. She denies pelvic pain. She reports seeing a small spot of blood on her underwear about a month ago. She reports it was likely after use of her dilator or intercourse. She reports to be using a vaginal dilator at times. Per the patient's report, her last PAP smear was last month by Dr. Dellis Filbert and was reported as normal. She will see Dr. Skeet Latch in May of this year. She has an appointment with Dr. Alvy Bimler tomorrow for medical oncology follow up.  ALLERGIES:  is allergic to oxycodone; penicillins; shellfish allergy; iodine; and sulfa antibiotics.  Meds: Current Outpatient Prescriptions  Medication Sig Dispense Refill  . albuterol (PROAIR HFA) 108 (90 Base) MCG/ACT inhaler Inhale 2 puffs into the lungs every 4 (four) hours as needed for wheezing or shortness of breath. 1 Inhaler 2  . albuterol (PROVENTIL) (2.5 MG/3ML) 0.083% nebulizer solution Take 3 mLs (2.5 mg total) by nebulization every 4 (four) hours as needed for wheezing or shortness of breath. 150 mL 2  . amLODipine (NORVASC) 10 MG tablet TAKE 1 TABLET EVERY DAY 90 tablet 2  . aspirin 81 MG tablet Take 81 mg by mouth daily.      Marland Kitchen atorvastatin (LIPITOR) 40 MG tablet Take 1 tablet (40 mg total) by mouth daily. 90 tablet 3  . benazepril (LOTENSIN) 40 MG  tablet TAKE 1 TABLET EVERY DAY 90 tablet 2  . budesonide-formoterol (SYMBICORT) 160-4.5 MCG/ACT inhaler TWO PUFFS TWICE A DAY TO PREVENT COUGH OR WHEEZE. RINSE, GARGLE AND SPIT AFTER USE. 1 Inhaler 5  . carvedilol (COREG) 12.5 MG tablet TAKE 1 TABLET (12.5 MG TOTAL) BY MOUTH 2 (TWO) TIMES DAILY WITH A MEAL. (Patient taking differently: Take 12.5 mg by mouth 3 (three) times daily. ) 180 tablet 2  . chlorpheniramine-HYDROcodone (TUSSIONEX PENNKINETIC ER) 10-8 MG/5ML SUER Take 5 mLs by mouth every 12 (twelve) hours as needed for cough. 120 mL 0  . diclofenac sodium (VOLTAREN) 1 % GEL Apply 2 g topically 4 (four) times daily as needed.     . diphenhydramine-acetaminophen (TYLENOL PM) 25-500 MG TABS Take 1 tablet by mouth at bedtime as needed. Reported on 01/30/2016    . fluticasone (FLOVENT HFA) 110 MCG/ACT inhaler Inhale 2 puffs into the lungs daily. 1 Inhaler 1  . furosemide (LASIX) 20 MG tablet TAKE 1 TO 2 TABLETS EVERY DAY AS NEEDED  FOR  FLUID 120 tablet 0  . levothyroxine (SYNTHROID, LEVOTHROID) 75 MCG tablet Take 1 tablet (75 mcg total) by mouth daily before breakfast. 90 tablet 1  . Multiple Vitamin (MULTIVITAMIN) tablet Take 1 tablet by mouth daily.    . naproxen (NAPROSYN) 500 MG tablet Take 1 tablet (500 mg total) by mouth 2 (two) times daily with a meal. 20 tablet 0  . pantoprazole (PROTONIX) 40 MG tablet Take 1 tablet (40 mg total) by mouth daily.  90 tablet 2  . Probiotic Product (ALIGN) 4 MG CAPS Take 1 capsule by mouth daily.     Marland Kitchen triamcinolone cream (KENALOG) 0.1 % Apply twice daily as needed to affected areas below face 30 g 3   No current facility-administered medications for this encounter.     Physical Findings: The patient is in no acute distress. Patient is alert and oriented.  height is 5\' 2"  (1.575 m) and weight is 249 lb 12.8 oz (113.3 kg). Her temperature is 97.9 F (36.6 C). Her blood pressure is 144/93 (abnormal) and her pulse is 88. Her oxygen saturation is 100%.      Neck  Without massess Heart RRR Chest CTAB Abdomen and pelvis are non-tender to palpation, no rigidity or guarding.   No groin adenopathy GYN exam- ext genitalia unremarkable.  Vaginal vault without evidence of disease on speculum and digital exam. No vaginal-rectal fistulas.  Lab Findings: Lab Results  Component Value Date   WBC 5.8 09/24/2016   HGB 12.3 09/24/2016   HCT 39.8 09/24/2016   MCV 67.7 (L) 09/24/2016   PLT 214.0 09/24/2016    Radiographic Findings: No results found.  Impression/Plan: NED She is approximately 2 years out from treatment. She will follow up with me on a PRN basis. She should continue alternate follow ups with gyn onc and her gynecologist. She is scheduled to follow up with Dr. Alvy Bimler tomorrow and Dr. Skeet Latch in May 2018.  I spent at least 20 minutes with the patient, approximately 50% of which was on counseling and care coordination.  _____________________________________   Eppie Gibson, MD  This document serves as a record of services personally performed by Eppie Gibson, MD. It was created on her behalf by Darcus Austin, a trained medical scribe. The creation of this record is based on the scribe's personal observations and the provider's statements to them. This document has been checked and approved by the attending provider.

## 2016-09-27 ENCOUNTER — Other Ambulatory Visit: Payer: Commercial Managed Care - HMO

## 2016-09-27 ENCOUNTER — Telehealth: Payer: Self-pay | Admitting: Hematology and Oncology

## 2016-09-27 ENCOUNTER — Other Ambulatory Visit: Payer: Self-pay | Admitting: *Deleted

## 2016-09-27 ENCOUNTER — Encounter: Payer: Self-pay | Admitting: Hematology and Oncology

## 2016-09-27 ENCOUNTER — Ambulatory Visit (HOSPITAL_BASED_OUTPATIENT_CLINIC_OR_DEPARTMENT_OTHER): Payer: Medicare PPO | Admitting: Hematology and Oncology

## 2016-09-27 ENCOUNTER — Ambulatory Visit: Payer: Medicare PPO

## 2016-09-27 DIAGNOSIS — G620 Drug-induced polyneuropathy: Secondary | ICD-10-CM | POA: Diagnosis not present

## 2016-09-27 DIAGNOSIS — T451X5A Adverse effect of antineoplastic and immunosuppressive drugs, initial encounter: Secondary | ICD-10-CM

## 2016-09-27 DIAGNOSIS — Z95828 Presence of other vascular implants and grafts: Secondary | ICD-10-CM

## 2016-09-27 DIAGNOSIS — C541 Malignant neoplasm of endometrium: Secondary | ICD-10-CM

## 2016-09-27 DIAGNOSIS — R718 Other abnormality of red blood cells: Secondary | ICD-10-CM | POA: Diagnosis not present

## 2016-09-27 MED ORDER — HEPARIN SOD (PORK) LOCK FLUSH 100 UNIT/ML IV SOLN
500.0000 [IU] | Freq: Once | INTRAVENOUS | Status: DC | PRN
  Filled 2016-09-27: qty 5

## 2016-09-27 MED ORDER — GABAPENTIN 300 MG PO CAPS: 300.0000 mg | ORAL_CAPSULE | Freq: Two times a day (BID) | ORAL | 1 refills | Status: DC

## 2016-09-27 MED ORDER — SODIUM CHLORIDE 0.9% FLUSH
10.0000 mL | INTRAVENOUS | Status: DC | PRN
  Filled 2016-09-27: qty 10

## 2016-09-27 MED ORDER — LIDOCAINE-PRILOCAINE 2.5-2.5 % EX CREA: 1.0000 "application " | TOPICAL_CREAM | CUTANEOUS | 0 refills | Status: DC | PRN

## 2016-09-27 NOTE — Progress Notes (Signed)
Pt didn't port accessed without cream. Stated " I'll make another appointment for my flush. Dalila Arca LPN

## 2016-09-27 NOTE — Telephone Encounter (Signed)
Gave patient AVS and schedule per 09/27/2016 los

## 2016-09-28 DIAGNOSIS — R718 Other abnormality of red blood cells: Secondary | ICD-10-CM | POA: Insufficient documentation

## 2016-09-28 HISTORY — DX: Other abnormality of red blood cells: R71.8

## 2016-09-28 NOTE — Assessment & Plan Note (Signed)
She has severe painful neuropathy.  She is only taking very small dose of gabapentin. We discussed increasing the dose of gabapentin for better control of her neuropathy

## 2016-09-28 NOTE — Assessment & Plan Note (Signed)
She has microcytosis without anemia. She had hemoglobinopathy evaluation in the past with normal distribution. I suspect she have thalassemia trait. She does not need treatment for this.  We discussed briefly the pattern of inheritance

## 2016-09-28 NOTE — Assessment & Plan Note (Signed)
She has no clinical residual disease after completion of treatment for almost 2 years. She has a port in place.  We will get her port accessed for future colonoscopy.  We will continue to flush the port every 6-8 weeks. I will consult with the radiation oncologist to see when would be the optimal time to get her port removed

## 2016-09-28 NOTE — Progress Notes (Signed)
[facility] progress notes  Patient Care Team: Shelda Pal, DO as PCP - General (Family Medicine) Suella Broad, MD as Consulting Physician (Physical Medicine and Rehabilitation) Gordy Levan, MD as Consulting Physician (Oncology) Janie Morning, MD as Attending Physician (Obstetrics and Gynecology) Eppie Gibson, MD as Attending Physician (Radiation Oncology)  CHIEF COMPLAINTS/PURPOSE OF VISIT:  History of  endometrial cancer, on surveillance she feels well today. Denies changes in her appetite or weight.  No bowel habit changes.   HISTORY OF PRESENTING ILLNESS:  Katelyn Fisher 66 y.o. female was transferred to my care after her prior physician has left.  I reviewed the patient's records extensive and collaborated the history with the patient. Summary of her history is as follows:   Endometrial cancer ([place])   05/07/2014 Initial Diagnosis    Patient presented to Dr Dellis Filbert with new onset postmenopausal spotting. [place] 05-07-14 reportedly had thickened endometrial stripe, with 3 fibroids largest 5x 5x 3.5 cm; endometrial biopsy 05-07-14 reportedly was concerning for at least FIGO grade 1 endometrial carcinoma      06/23/2014 Surgery    Surgery was robotic hysterectomy, BSO, bilateral pelvic and para-aortic nodes by Dr Skeet Latch at [facility] on 06-23-14      06/23/2014 Pathology Results    Pathology [phone]) found mixed high grade carcinoma (serous 50% and endometrioid 50%) FIGO grade 3, involving inner half of myometrium with depth 5 mm where wall 3 cm thick, no serosal or lower uterine segment involvement, no cervical or adnexal involvement, LVSI present, 0/15 nodes involved      07/30/2014 PET scan    PET/ CT at [facility] 07-30-14 had no findings of concern for distant disease      08/16/2014 Procedure    Ultrasound and fluoroscopically guided right internal jugular single lumen power port catheter insertion. Tip in the SVC/RA junction. Catheter ready for  use.      08/24/2014 - 12/06/2014 Chemotherapy    She received 6 cycles of carbo/Taxol      11/29/2014 Genetic Testing    Genetics testing normal 11-2014 (comprehensive cancer panel by [facility])      12/17/2014 - 01/03/2015 Radiation Therapy    Radiation treatment dates:   12/17/2014, 12/22/2014, 12/27/2014, 12/31/2014, 01/03/2015  Site/dose:   Proximal 4.5 cm of Vaginal cuff / 30 Gy in 5 fractions Rx to 0.1cm submucosal depth  Beams/energy:   HDR brachytherapy/ Ir 192      03/03/2015 Imaging    Interval hysterectomy. No evidence of local recurrence or metastatic disease. 2. No acute abdominal pelvic findings. 3. Distal colonic diverticulosis with possible rectal wall thickening, possibly related to prior radiation therapy. Correlate clinically.      She complained of severe painful peripheral neuropathy from prior chemotherapy. Denies vaginal bleeding or discomfort She has colonoscopy pending next month MEDICAL HISTORY:  Past Medical History:  Diagnosis Date  . Anxiety   . Arthritis    back  . Asthma   . Benign paroxysmal positional vertigo 11/01/2012  . Cancer (HCC)    endometrial  . Depression   . Family history of breast cancer   . Family history of colon cancer   . Family history of ovarian cancer   . Family history of prostate cancer   . GERD (gastroesophageal reflux disease)   . History of colonic diverticulitis   . Hx of colonic polyps   . Hyperlipidemia   . Hypertension   . 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  . Low back pain radiating to right leg 08/18/2012  .  Obesity   . Radiation 12/17/14-01/03/15   vaginal brachytherapy  . Thyroid disease 06/01/2013    SURGICAL HISTORY: Past Surgical History:  Procedure Laterality Date  . ABDOMINAL HYSTERECTOMY  06/23/14   [facility], TRH/BSO  . CHOLECYSTECTOMY  1991  . DILATION AND CURETTAGE OF UTERUS    . TUBAL LIGATION      SOCIAL HISTORY: Social History   Social History  . Marital status: Married    Spouse name: N/A  .  Number of children: 3  . Years of education: N/A   Occupational History  . retired Network engineer    Social History Main Topics  . Smoking status: Never Smoker  . Smokeless tobacco: Never Used  . Alcohol use No  . Drug use: No  . Sexual activity: Yes    Partners: Male     Comment: lives with husband, no dietary restrictions   G2 P 2   Other Topics Concern  . Not on file   Social History Narrative   Married- 94 years   Never Smoked   Alcohol use-no   Drug use-no   Occupation: housewife   Caffeine use/day:  None   Does Patient Exercise:  yes    FAMILY HISTORY: Family History  Problem Relation Age of Onset  . Heart disease Maternal Aunt     x 3 -2 brothers  . Hypertension Maternal Aunt   . Breast cancer Maternal Aunt     maternal half; dx in her 25s  . Ovarian cancer Maternal Aunt     dx in her 48s  . Prostate cancer Father 81  . Diabetes Father   . Cancer Father     lung cancer, asbestos exposure  . Heart disease Brother   . Diabetes Brother   . Diabetes Mother   . Lupus Mother   . Heart disease Brother   . Hyperlipidemia Sister   . Heart disease Brother   . Diabetes Brother   . Colon polyps Brother   . Heart disease Brother   . Diabetes Brother   . Colon polyps Brother   . Colon cancer Maternal Grandmother     dx in her 59s  . Leukemia Maternal Uncle 21  . Cancer Paternal Aunt     NOS  . Prostate cancer Paternal Uncle     dx in his 22s  . Breast cancer Other     dx in her 70s    ALLERGIES:  is allergic to oxycodone; penicillins; shellfish allergy; iodine; and sulfa antibiotics.  MEDICATIONS:  Current Outpatient Prescriptions  Medication Sig Dispense Refill  . amLODipine (NORVASC) 10 MG tablet TAKE 1 TABLET EVERY DAY 90 tablet 2  . aspirin 81 MG tablet Take 81 mg by mouth daily.      Marland Kitchen atorvastatin (LIPITOR) 40 MG tablet Take 1 tablet (40 mg total) by mouth daily. 90 tablet 3  . benazepril (LOTENSIN) 40 MG tablet TAKE 1 TABLET EVERY DAY 90 tablet 2   . carvedilol (COREG) 12.5 MG tablet TAKE 1 TABLET (12.5 MG TOTAL) BY MOUTH 2 (TWO) TIMES DAILY WITH A MEAL. (Patient taking differently: Take 12.5 mg by mouth 3 (three) times daily. ) 180 tablet 2  . diclofenac sodium (VOLTAREN) 1 % GEL Apply 2 g topically 4 (four) times daily as needed.     . furosemide (LASIX) 20 MG tablet TAKE 1 TO 2 TABLETS EVERY DAY AS NEEDED  FOR  FLUID 120 tablet 0  . levothyroxine (SYNTHROID, LEVOTHROID) 75 MCG tablet Take 1 tablet (  75 mcg total) by mouth daily before breakfast. 90 tablet 1  . Multiple Vitamin (MULTIVITAMIN) tablet Take 1 tablet by mouth daily.    . pantoprazole (PROTONIX) 40 MG tablet Take 1 tablet (40 mg total) by mouth daily. 90 tablet 2  . Probiotic Product (ALIGN) 4 MG CAPS Take 1 capsule by mouth daily.     Marland Kitchen triamcinolone cream (KENALOG) 0.1 % Apply twice daily as needed to affected areas below face 30 g 3  . albuterol (PROAIR HFA) 108 (90 Base) MCG/ACT inhaler Inhale 2 puffs into the lungs every 4 (four) hours as needed for wheezing or shortness of breath. (Patient not taking: Reported on 09/27/2016) 1 Inhaler 2  . albuterol (PROVENTIL) (2.5 MG/3ML) 0.083% nebulizer solution Take 3 mLs (2.5 mg total) by nebulization every 4 (four) hours as needed for wheezing or shortness of breath. (Patient not taking: Reported on 09/27/2016) 150 mL 2  . budesonide-formoterol (SYMBICORT) 160-4.5 MCG/ACT inhaler TWO PUFFS TWICE A DAY TO PREVENT COUGH OR WHEEZE. RINSE, GARGLE AND SPIT AFTER USE. (Patient not taking: Reported on 09/27/2016) 1 Inhaler 5  . chlorpheniramine-HYDROcodone (TUSSIONEX PENNKINETIC ER) 10-8 MG/5ML SUER Take 5 mLs by mouth every 12 (twelve) hours as needed for cough. (Patient not taking: Reported on 09/27/2016) 120 mL 0  . diphenhydramine-acetaminophen (TYLENOL PM) 25-500 MG TABS Take 1 tablet by mouth at bedtime as needed. Reported on 01/30/2016    . fluticasone (FLOVENT HFA) 110 MCG/ACT inhaler Inhale 2 puffs into the lungs daily. (Patient not taking:  Reported on 09/27/2016) 1 Inhaler 1  . gabapentin (NEURONTIN) 300 MG capsule Take 1 capsule (300 mg total) by mouth 2 (two) times daily. 60 capsule 1  . lidocaine-prilocaine (EMLA) cream Apply 1 application topically as needed. 30 g 0  . naproxen (NAPROSYN) 500 MG tablet Take 1 tablet (500 mg total) by mouth 2 (two) times daily with a meal. (Patient not taking: Reported on 09/27/2016) 20 tablet 0   No current facility-administered medications for this visit.     REVIEW OF SYSTEMS:   Constitutional: Denies fevers, chills or abnormal night sweats Eyes: Denies blurriness of vision, double vision or watery eyes Ears, nose, mouth, throat, and face: Denies mucositis or sore throat Respiratory: Denies cough, dyspnea or wheezes Cardiovascular: Denies palpitation, chest discomfort or lower extremity swelling Gastrointestinal:  Denies nausea, heartburn or change in bowel habits Skin: Denies abnormal skin rashes Lymphatics: Denies new lymphadenopathy or easy bruising Neurological:Denies numbness, tingling or new weaknesses Behavioral/Psych: Mood is stable, no new changes  All other systems were reviewed with the patient and are negative.  PHYSICAL EXAMINATION: ECOG PERFORMANCE STATUS: 1 - Symptomatic but completely ambulatory  Vitals:   09/27/16 1046  BP: 134/81  Pulse: (!) 107  Resp: 18  Temp: 97.9 F (36.6 C)   Filed Weights   09/27/16 1046  Weight: 247 lb 11.2 oz (112.4 kg)    GENERAL:alert, no distress and comfortable SKIN: skin color, texture, turgor are normal, no rashes or significant lesions EYES: normal, conjunctiva are pink and non-injected, sclera clear OROPHARYNX:no exudate, normal lips, buccal mucosa, and tongue  NECK: supple, thyroid normal size, non-tender, without nodularity LYMPH:  no palpable lymphadenopathy in the cervical, axillary or inguinal LUNGS: clear to auscultation and percussion with normal breathing effort HEART: regular rate & rhythm and no murmurs without  lower extremity edema ABDOMEN:abdomen soft, non-tender and normal bowel sounds Musculoskeletal:no cyanosis of digits and no clubbing  PSYCH: alert & oriented x 3 with fluent speech NEURO: no focal motor/sensory  deficits  LABORATORY DATA:  I have reviewed the data as listed Lab Results  Component Value Date   WBC 5.8 09/24/2016   HGB 12.3 09/24/2016   HCT 39.8 09/24/2016   MCV 67.7 (L) 09/24/2016   PLT 214.0 09/24/2016    Recent Labs  12/29/15 0841 04/02/16 0817 05/10/16 0838 09/24/16 0929  NA 142 141 144 143  K 3.8 3.9 3.5 4.4  CL 105 105  --  107  CO2 30 30 27 26   GLUCOSE 114* 119* 110 120*  BUN 9 14 16.6 8  CREATININE 0.66 0.70 0.8 0.76  CALCIUM 9.5 9.1 9.2 9.4  PROT 7.2 6.9 6.9 7.2  ALBUMIN 4.0 3.8 3.6 4.0  AST 16 18 18 18   ALT 18 17 21 19   ALKPHOS 104 92 104 117  BILITOT 0.3 0.3 0.22 0.3    ASSESSMENT & PLAN:  Endometrial cancer ([place]) She has no clinical residual disease after completion of treatment for almost 2 years. She has a port in place.  We will get her port accessed for future colonoscopy.  We will continue to flush the port every 6-8 weeks. 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  She is only taking very small dose of gabapentin. We discussed increasing the dose of gabapentin for better control of her neuropathy  Microcytosis She has microcytosis without anemia. She had hemoglobinopathy evaluation in the past with normal distribution. I suspect she have thalassemia trait. She does not need treatment for this.  We discussed briefly the pattern of inheritance   No orders of the defined types were placed in this encounter.   All questions were answered. The patient knows to call the clinic with any problems, questions or concerns. I spent 25 minutes counseling the patient face to face. The total  time spent in the appointment was 60 minutes and more than 50% was on counseling.     Heath Lark, MD 09/28/2016 3:41 PM

## 2016-10-02 ENCOUNTER — Ambulatory Visit: Payer: Commercial Managed Care - HMO | Admitting: Podiatry

## 2016-10-08 ENCOUNTER — Ambulatory Visit (AMBULATORY_SURGERY_CENTER): Payer: Self-pay

## 2016-10-08 ENCOUNTER — Encounter: Payer: Self-pay | Admitting: Internal Medicine

## 2016-10-08 VITALS — Ht 62.5 in | Wt 246.0 lb

## 2016-10-08 DIAGNOSIS — Z860100 Personal history of colon polyps, unspecified: Secondary | ICD-10-CM

## 2016-10-08 MED ORDER — SUPREP BOWEL PREP KIT 17.5-3.13-1.6 GM/177ML PO SOLN: 1.0000 | Freq: Once | ORAL | 0 refills | Status: AC

## 2016-10-08 NOTE — Progress Notes (Signed)
No allergies to eggs or soy No past problems with anesthesia No diet meds No home oxygen  Registered for emmi 

## 2016-10-16 ENCOUNTER — Other Ambulatory Visit: Payer: Self-pay | Admitting: Family Medicine

## 2016-10-16 DIAGNOSIS — Z1231 Encounter for screening mammogram for malignant neoplasm of breast: Secondary | ICD-10-CM

## 2016-10-24 ENCOUNTER — Encounter: Payer: Self-pay | Admitting: Internal Medicine

## 2016-10-24 ENCOUNTER — Ambulatory Visit (AMBULATORY_SURGERY_CENTER): Payer: Medicare PPO | Admitting: Internal Medicine

## 2016-10-24 VITALS — BP 97/61 | HR 78 | Temp 97.5°F | Resp 12 | Ht 62.0 in | Wt 246.0 lb

## 2016-10-24 DIAGNOSIS — Z86010 Personal history of colonic polyps: Secondary | ICD-10-CM | POA: Diagnosis not present

## 2016-10-24 DIAGNOSIS — D123 Benign neoplasm of transverse colon: Secondary | ICD-10-CM

## 2016-10-24 DIAGNOSIS — D122 Benign neoplasm of ascending colon: Secondary | ICD-10-CM

## 2016-10-24 DIAGNOSIS — Z1211 Encounter for screening for malignant neoplasm of colon: Secondary | ICD-10-CM | POA: Diagnosis not present

## 2016-10-24 MED ORDER — SODIUM CHLORIDE 0.9 % IV SOLN: 500.0000 mL | INTRAVENOUS | Status: DC

## 2016-10-24 NOTE — Patient Instructions (Signed)
YOU HAD AN ENDOSCOPIC PROCEDURE TODAY AT THE [facility]:   Refer to the procedure report that was given to you for any specific questions about what was found during the examination.  If the procedure report does not answer your questions, please call your gastroenterologist to clarify.  If you requested that your care partner not be given the details of your procedure findings, then the procedure report has been included in a sealed envelope for you to review at your convenience later.  YOU SHOULD EXPECT: Some feelings of bloating in the abdomen. Passage of more gas than usual.  Walking can help get rid of the air that was put into your GI tract during the procedure and reduce the bloating. If you had a lower endoscopy (such as a colonoscopy or flexible sigmoidoscopy) you may notice spotting of blood in your stool or on the toilet paper. If you underwent a bowel prep for your procedure, you may not have a normal bowel movement for a few days.  Please Note:  You might notice some irritation and congestion in your nose or some drainage.  This is from the oxygen used during your procedure.  There is no need for concern and it should clear up in a day or so.  SYMPTOMS TO REPORT IMMEDIATELY:   Following lower endoscopy (colonoscopy or flexible sigmoidoscopy):  Excessive amounts of blood in the stool  Significant tenderness or worsening of abdominal pains  Swelling of the abdomen that is new, acute  Fever of 100F or higher    For urgent or emergent issues, a gastroenterologist can be reached at any hour by calling [phone].   DIET:  We do recommend a small meal at first, but then you may proceed to your regular diet.  Drink plenty of fluids but you should avoid alcoholic beverages for 24 hours.  ACTIVITY:  You should plan to take it easy for the rest of today and you should NOT DRIVE or use heavy machinery until tomorrow (because of the sedation medicines used during the test).     FOLLOW UP: Our staff will call the number listed on your records the next business day following your procedure to check on you and address any questions or concerns that you may have regarding the information given to you following your procedure. If we do not reach you, we will leave a message.  However, if you are feeling well and you are not experiencing any problems, there is no need to return our call.  We will assume that you have returned to your regular daily activities without incident.  If any biopsies were taken you will be contacted by phone or by letter within the next 1-3 weeks.  Please call us at [phone] if you have not heard about the biopsies in 3 weeks.    SIGNATURES/CONFIDENTIALITY: You and/or your care partner have signed paperwork which will be entered into your electronic medical record.  These signatures attest to the fact that that the information above on your After Visit Summary has been reviewed and is understood.  Full responsibility of the confidentiality of this discharge information lies with you and/or your care-partner  Polyp and hemorrhoid information given.. 

## 2016-10-24 NOTE — Progress Notes (Signed)
Called to room to assist during endoscopic procedure.  Patient ID and intended procedure confirmed with present staff. Received instructions for my participation in the procedure from the performing physician.  

## 2016-10-24 NOTE — Op Note (Signed)
[facility] Patient Name: Katelyn Fisher Procedure Date: 10/24/2016 10:56 AM MRN: [id number] Endoscopist: Jerene Bears , MD Age: 65 Referring MD:  Date of Birth: [birth year] Gender: Female Account #: [id number] Procedure:                Colonoscopy Indications:              High risk colon cancer surveillance: Personal                            history of colonic polyps, Last colonoscopy 5 years                            ago Medicines:                Monitored Anesthesia Care Procedure:                Pre-Anesthesia Assessment:                           - Prior to the procedure, a History and Physical                            was performed, and patient medications and                            allergies were reviewed. The patient's tolerance of                            previous anesthesia was also reviewed. The risks                            and benefits of the procedure and the sedation                            options and risks were discussed with the patient.                            All questions were answered, and informed consent                            was obtained. Prior Anticoagulants: The patient has                            taken no previous anticoagulant or antiplatelet                            agents. ASA Grade Assessment: III - A patient with                            severe systemic disease. After reviewing the risks                            and benefits, the patient was deemed in  satisfactory condition to undergo the procedure.                           After obtaining informed consent, the colonoscope                            was passed under direct vision. Throughout the                            procedure, the patient's blood pressure, pulse, and                            oxygen saturations were monitored continuously. The                            Colonoscope was introduced through the anus and                      advanced to the the cecum, identified by                            appendiceal orifice and ileocecal valve. The                            colonoscopy was performed without difficulty. The                            patient tolerated the procedure well. The quality                            of the bowel preparation was good. The ileocecal                            valve, appendiceal orifice, and rectum were                            photographed. Scope In: 11:03:22 AM Scope Out: 11:15:30 AM Scope Withdrawal Time: 0 hours 9 minutes 46 seconds  Total Procedure Duration: 0 hours 12 minutes 8 seconds  Findings:                 The digital rectal exam was normal.                           A 7 mm polyp was found in the ascending colon. The                            polyp was sessile. The polyp was removed with a                            cold snare. Resection and retrieval were complete.                           A 3 mm polyp was found in the transverse colon. The  polyp was sessile. The polyp was removed with a                            cold snare. Resection and retrieval were complete.                           Internal hemorrhoids were found during                            retroflexion. The hemorrhoids were small.                           The exam was otherwise without abnormality. Complications:            No immediate complications. Estimated Blood Loss:     Estimated blood loss was minimal. Impression:               - One 7 mm polyp in the ascending colon, removed                            with a cold snare. Resected and retrieved.                           - One 3 mm polyp in the transverse colon, removed                            with a cold snare. Resected and retrieved.                           - Internal hemorrhoids.                           - The examination was otherwise normal. Recommendation:           - Patient has a  contact number available for                            emergencies. The signs and symptoms of potential                            delayed complications were discussed with the                            patient. Return to normal activities tomorrow.                            Written discharge instructions were provided to the                            patient.                           - Resume previous diet.                           - Continue present medications.                           -  Await pathology results.                           - Repeat colonoscopy in 5 years for surveillance. Jerene Bears, MD 10/24/2016 11:18:56 AM This report has been signed electronically.

## 2016-10-24 NOTE — Progress Notes (Signed)
A and O x3. Report to RN. Tolerated MAC anesthesia well.

## 2016-10-25 ENCOUNTER — Telehealth: Payer: Self-pay | Admitting: *Deleted

## 2016-10-25 ENCOUNTER — Encounter: Payer: Self-pay | Admitting: Hematology and Oncology

## 2016-10-25 ENCOUNTER — Telehealth: Payer: Self-pay

## 2016-10-25 ENCOUNTER — Ambulatory Visit (HOSPITAL_BASED_OUTPATIENT_CLINIC_OR_DEPARTMENT_OTHER): Payer: Medicare PPO | Admitting: Hematology and Oncology

## 2016-10-25 VITALS — BP 137/89 | HR 89 | Temp 98.0°F | Resp 18 | Ht 62.0 in | Wt 249.8 lb

## 2016-10-25 DIAGNOSIS — E1140 Type 2 diabetes mellitus with diabetic neuropathy, unspecified: Secondary | ICD-10-CM

## 2016-10-25 DIAGNOSIS — T451X5A Adverse effect of antineoplastic and immunosuppressive drugs, initial encounter: Secondary | ICD-10-CM

## 2016-10-25 DIAGNOSIS — G620 Drug-induced polyneuropathy: Secondary | ICD-10-CM

## 2016-10-25 DIAGNOSIS — C541 Malignant neoplasm of endometrium: Secondary | ICD-10-CM | POA: Diagnosis not present

## 2016-10-25 DIAGNOSIS — Z95828 Presence of other vascular implants and grafts: Secondary | ICD-10-CM

## 2016-10-25 HISTORY — DX: Type 2 diabetes mellitus with diabetic neuropathy, unspecified: E11.40

## 2016-10-25 MED ORDER — HEPARIN SOD (PORK) LOCK FLUSH 100 UNIT/ML IV SOLN
500.0000 [IU] | Freq: Once | INTRAVENOUS | Status: AC | PRN
  Administered 2016-10-25: 500 [IU] via INTRAVENOUS
  Filled 2016-10-25: qty 5

## 2016-10-25 MED ORDER — SODIUM CHLORIDE 0.9% FLUSH
10.0000 mL | INTRAVENOUS | Status: DC | PRN
Start: 2016-10-25 — End: 2016-10-25
  Administered 2016-10-25: 10 mL via INTRAVENOUS
  Filled 2016-10-25: qty 10

## 2016-10-25 MED ORDER — GABAPENTIN 300 MG PO CAPS: 300.0000 mg | ORAL_CAPSULE | Freq: Two times a day (BID) | ORAL | 9 refills | Status: DC

## 2016-10-25 MED ORDER — METFORMIN HCL 500 MG PO TABS: 500.0000 mg | ORAL_TABLET | Freq: Every day | ORAL | 3 refills | Status: DC

## 2016-10-25 NOTE — Patient Instructions (Signed)

## 2016-10-25 NOTE — Assessment & Plan Note (Signed)
She has no clinical residual disease after completion of treatment for almost 2 years. She has a port in place.  We will get her port flushed today I recommend she discuss port removal with her GYN oncologist next month

## 2016-10-25 NOTE — Assessment & Plan Note (Signed)
The patient has multi-factorial peripheral neuropathy Her last chemotherapy was over 2 years ago I suspect the neuropathy could be due to untreated diabetes As above, I will start her on metformin. She was recently started on gabapentin of which she tolerated that well I refill her prescription of gabapentin and recommend future medication adjustment through her primary care doctor

## 2016-10-25 NOTE — Progress Notes (Signed)
[facility] OFFICE PROGRESS NOTE  Patient Care Team: Shelda Pal, DO as PCP - General (Family Medicine) Suella Broad, MD as Consulting Physician (Physical Medicine and Rehabilitation) Gordy Levan, MD as Consulting Physician (Oncology) Janie Morning, MD as Attending Physician (Obstetrics and Gynecology) Eppie Gibson, MD as Attending Physician (Radiation Oncology)  SUMMARY OF ONCOLOGIC HISTORY:   Endometrial cancer [facility])   05/07/2014 Initial Diagnosis    Patient presented to Dr Dellis Filbert with new onset postmenopausal spotting. 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 Tip in the SVC/RA junction. Catheter ready for use.      08/24/2014 - 12/06/2014 Chemotherapy    She received 6 cycles of carbo/Taxol      11/29/2014 Genetic Testing    Genetics testing normal 11-2014 (comprehensive cancer panel by [facility])      12/17/2014 - 01/03/2015 Radiation Therapy    Radiation treatment dates:   12/17/2014, 12/22/2014, 12/27/2014, 12/31/2014, 01/03/2015  Site/dose:   Proximal 4.5 cm of Vaginal cuff / 30 Gy in 5 fractions Rx to 0.1cm  submucosal depth  Beams/energy:   HDR brachytherapy/ Ir 192      03/03/2015 Imaging    Interval hysterectomy. No evidence of local recurrence or metastatic disease. 2. No acute abdominal pelvic findings. 3. Distal colonic diverticulosis with possible rectal wall thickening, possibly related to prior radiation therapy. Correlate clinically.       INTERVAL HISTORY: Please see below for problem oriented charting. She feels well. She had recent colonoscopy with multiple polyps found She tolerated the procedure well Her peripheral neuropathy appears better when she started on vitamin B12 and gabapentin. She denies side effects from gabapentin  REVIEW OF SYSTEMS:   Constitutional: Denies fevers, chills or abnormal weight loss Eyes: Denies blurriness of vision Ears, nose, mouth, throat, and face: Denies mucositis or sore throat Respiratory: Denies cough, dyspnea or wheezes Cardiovascular: Denies palpitation, chest discomfort or lower extremity swelling Gastrointestinal:  Denies nausea, heartburn or change in bowel habits Skin: Denies abnormal skin rashes Lymphatics: Denies new lymphadenopathy or easy bruising Neurological:Denies numbness, tingling or new weaknesses Behavioral/Psych: Mood is stable, no new changes  All other systems were reviewed with the patient and are negative.  I have reviewed the past medical history, past surgical history, social history and family history with the patient and they are unchanged from previous note.  ALLERGIES:  is allergic to oxycodone; penicillins; shellfish allergy; iodine; and sulfa antibiotics.  MEDICATIONS:  Current Outpatient Prescriptions  Medication Sig Dispense Refill  . amLODipine (NORVASC) 10 MG tablet TAKE 1 TABLET EVERY DAY 90 tablet 2  . aspirin 81 MG  tablet Take 81 mg by mouth daily.      Marland Kitchen atorvastatin (LIPITOR) 40 MG tablet Take 1 tablet (40 mg total) by mouth daily. 90 tablet 3  . benazepril (LOTENSIN) 40 MG tablet TAKE 1  TABLET EVERY DAY 90 tablet 2  . carvedilol (COREG) 12.5 MG tablet TAKE 1 TABLET (12.5 MG TOTAL) BY MOUTH 2 (TWO) TIMES DAILY WITH A MEAL. (Patient taking differently: Take 12.5 mg by mouth 3 (three) times daily. ) 180 tablet 2  . diphenhydramine-acetaminophen (TYLENOL PM) 25-500 MG TABS Take 1 tablet by mouth at bedtime as needed. Reported on 01/30/2016    . furosemide (LASIX) 20 MG tablet TAKE 1 TO 2 TABLETS EVERY DAY AS NEEDED  FOR  FLUID 120 tablet 0  . gabapentin (NEURONTIN) 300 MG capsule Take 1 capsule (300 mg total) by mouth 2 (two) times daily. 60 capsule 9  . levothyroxine (SYNTHROID, LEVOTHROID) 75 MCG tablet Take 1 tablet (75 mcg total) by mouth daily before breakfast. 90 tablet 1  . lidocaine-prilocaine (EMLA) cream Apply 1 application topically as needed. 30 g 0  . naproxen (NAPROSYN) 500 MG tablet Take 1 tablet (500 mg total) by mouth 2 (two) times daily with a meal. 20 tablet 0  . pantoprazole (PROTONIX) 40 MG tablet Take 1 tablet (40 mg total) by mouth daily. 90 tablet 2  . Probiotic Product (ALIGN) 4 MG CAPS Take 1 capsule by mouth daily.     Marland Kitchen triamcinolone cream (KENALOG) 0.1 % Apply twice daily as needed to affected areas below face 30 g 3  . albuterol (PROAIR HFA) 108 (90 Base) MCG/ACT inhaler Inhale 2 puffs into the lungs every 4 (four) hours as needed for wheezing or shortness of breath. (Patient not taking: Reported on 10/24/2016) 1 Inhaler 2  . albuterol (PROVENTIL) (2.5 MG/3ML) 0.083% nebulizer solution Take 3 mLs (2.5 mg total) by nebulization every 4 (four) hours as needed for wheezing or shortness of breath. (Patient not taking: Reported on 10/24/2016) 150 mL 2  . budesonide-formoterol (SYMBICORT) 160-4.5 MCG/ACT inhaler TWO PUFFS TWICE A DAY TO PREVENT COUGH OR WHEEZE. RINSE, GARGLE AND SPIT AFTER USE. (Patient not taking: Reported on 10/24/2016) 1 Inhaler 5  . chlorpheniramine-HYDROcodone (TUSSIONEX PENNKINETIC ER) 10-8 MG/5ML SUER Take 5 mLs by mouth every 12 (twelve) hours as  needed for cough. (Patient not taking: Reported on 10/24/2016) 120 mL 0  . fluticasone (FLOVENT HFA) 110 MCG/ACT inhaler Inhale 2 puffs into the lungs daily. (Patient not taking: Reported on 10/24/2016) 1 Inhaler 1  . metFORMIN (GLUCOPHAGE) 500 MG tablet Take 1 tablet (500 mg total) by mouth daily with breakfast. 30 tablet 3  . Multiple Vitamin (MULTIVITAMIN) tablet Take 1 tablet by mouth daily.     Current Facility-Administered Medications  Medication Dose Route Frequency Provider Last Rate Last Dose  . 0.9 %  sodium chloride infusion  500 mL Intravenous Continuous Jerene Bears, MD      . sodium chloride flush (NS) 0.9 % injection 10 mL  10 mL Intravenous PRN Curt Bears, MD   10 mL at 10/25/16 0857    PHYSICAL EXAMINATION: ECOG PERFORMANCE STATUS: 1 - Symptomatic but completely ambulatory  Vitals:   10/25/16 0822  BP: 137/89  Pulse: 89  Resp: 18  Temp: 98 F (36.7 C)   Filed Weights   10/25/16 0822  Weight: 249 lb 12.8 oz (113.3 kg)    GENERAL:alert, no distress and comfortable SKIN: skin color, texture, turgor are normal, no rashes or significant lesions EYES:  normal, Conjunctiva are pink and non-injected, sclera clear Musculoskeletal:no cyanosis of digits and no clubbing  NEURO: alert & oriented x 3 with fluent speech, no focal motor/sensory deficits  LABORATORY DATA:  I have reviewed the data as listed    Component Value Date/Time   NA 143 09/24/2016 0929   NA 144 05/10/2016 0838   K 4.4 09/24/2016 0929   K 3.5 05/10/2016 0838   CL 107 09/24/2016 0929   CO2 26 09/24/2016 0929   CO2 27 05/10/2016 0838   GLUCOSE 120 (H) 09/24/2016 0929   GLUCOSE 110 05/10/2016 0838   GLUCOSE 69 (L) 05/10/2006 1253   BUN 8 09/24/2016 0929   BUN 16.6 05/10/2016 0838   CREATININE 0.76 09/24/2016 0929   CREATININE 0.8 05/10/2016 0838   CALCIUM 9.4 09/24/2016 0929   CALCIUM 9.2 05/10/2016 0838   PROT 7.2 09/24/2016 0929   PROT 6.9 05/10/2016 0838   ALBUMIN 4.0 09/24/2016 0929    ALBUMIN 3.6 05/10/2016 0838   AST 18 09/24/2016 0929   AST 18 05/10/2016 0838   ALT 19 09/24/2016 0929   ALT 21 05/10/2016 0838   ALKPHOS 117 09/24/2016 0929   ALKPHOS 104 05/10/2016 0838   BILITOT 0.3 09/24/2016 0929   BILITOT 0.22 05/10/2016 0838   GFRNONAA >90 10/14/2012 1250   GFRAA >90 10/14/2012 1250    No results found for: SPEP, UPEP  Lab Results  Component Value Date   WBC 5.8 09/24/2016   NEUTROABS 2.9 05/10/2016   HGB 12.3 09/24/2016   HCT 39.8 09/24/2016   MCV 67.7 (L) 09/24/2016   PLT 214.0 09/24/2016      Chemistry      Component Value Date/Time   NA 143 09/24/2016 0929   NA 144 05/10/2016 0838   K 4.4 09/24/2016 0929   K 3.5 05/10/2016 0838   CL 107 09/24/2016 0929   CO2 26 09/24/2016 0929   CO2 27 05/10/2016 0838   BUN 8 09/24/2016 0929   BUN 16.6 05/10/2016 0838   CREATININE 0.76 09/24/2016 0929   CREATININE 0.8 05/10/2016 0838      Component Value Date/Time   CALCIUM 9.4 09/24/2016 0929   CALCIUM 9.2 05/10/2016 0838   ALKPHOS 117 09/24/2016 0929   ALKPHOS 104 05/10/2016 0838   AST 18 09/24/2016 0929   AST 18 05/10/2016 0838   ALT 19 09/24/2016 0929   ALT 21 05/10/2016 0838   BILITOT 0.3 09/24/2016 0929   BILITOT 0.22 05/10/2016 0838     I review her colonoscopy report ASSESSMENT & PLAN:  Endometrial cancer ([place]) She has no clinical residual disease after completion of treatment for almost 2 years. She has a port in place.  We will get her port flushed today I recommend she discuss port removal with her GYN oncologist next month  Type 2 diabetes mellitus with diabetic neuropathy, without long-term current use of insulin ([place]) The patient has recent diagnosis of diabetes with peripheral neuropathy Diabetes and morbid obesity is a risk factor for endometrial cancer recurrence We discussed dietary modification, exercise program and I recommend starting her on metformin 500 mg daily, dose to be titrated by her primary care doctor in her next  visit in June.  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 She was recently started on gabapentin of which she tolerated that well I refill  her prescription of gabapentin and recommend future medication adjustment through her primary care doctor   No orders of the defined types were placed in this encounter.  All questions were answered. The patient knows to call the clinic with any problems, questions or concerns. No barriers to learning was detected. I spent 15 minutes counseling the patient face to face. The total time spent in the appointment was 20 minutes and more than 50% was on counseling and review of test results     Heath Lark, MD 10/25/2016 9:11 AM

## 2016-10-25 NOTE — Assessment & Plan Note (Signed)
The patient has recent diagnosis of diabetes with peripheral neuropathy Diabetes and morbid obesity is a risk factor for endometrial cancer recurrence We discussed dietary modification, exercise program and I recommend starting her on metformin 500 mg daily, dose to be titrated by her primary care doctor in her next visit in June.

## 2016-10-25 NOTE — Telephone Encounter (Signed)
No answer, message left for the patient. 

## 2016-10-25 NOTE — Telephone Encounter (Signed)
No answer left voice mail

## 2016-10-26 ENCOUNTER — Telehealth: Payer: Self-pay | Admitting: Hematology and Oncology

## 2016-10-26 NOTE — Telephone Encounter (Signed)
Called patient to schedule port flush appointment. Patient did not stop by scheduling on 4.5.18 to have flush appointment added for the day. LVM

## 2016-10-26 NOTE — Telephone Encounter (Signed)
Patient returned phone call to Malachi Carl for a flush appointment.  Need to call her back

## 2016-10-31 ENCOUNTER — Encounter: Payer: Self-pay | Admitting: Internal Medicine

## 2016-11-01 ENCOUNTER — Other Ambulatory Visit: Payer: Self-pay | Admitting: *Deleted

## 2016-11-01 MED ORDER — AMLODIPINE BESYLATE 10 MG PO TABS: 10.0000 mg | ORAL_TABLET | Freq: Every day | ORAL | 0 refills | Status: DC

## 2016-11-01 MED ORDER — BENAZEPRIL HCL 40 MG PO TABS: 40.0000 mg | ORAL_TABLET | Freq: Every day | ORAL | 0 refills | Status: DC

## 2016-11-01 NOTE — Telephone Encounter (Signed)
Rx's sent to the pharmacy by e-script.//AB/CMA 

## 2016-11-07 ENCOUNTER — Other Ambulatory Visit: Payer: Self-pay | Admitting: *Deleted

## 2016-11-07 ENCOUNTER — Ambulatory Visit (INDEPENDENT_AMBULATORY_CARE_PROVIDER_SITE_OTHER): Payer: Medicare PPO | Admitting: Podiatry

## 2016-11-07 DIAGNOSIS — T451X5A Adverse effect of antineoplastic and immunosuppressive drugs, initial encounter: Secondary | ICD-10-CM

## 2016-11-07 DIAGNOSIS — G620 Drug-induced polyneuropathy: Secondary | ICD-10-CM | POA: Diagnosis not present

## 2016-11-07 DIAGNOSIS — B351 Tinea unguium: Secondary | ICD-10-CM

## 2016-11-07 MED ORDER — AMLODIPINE BESYLATE 10 MG PO TABS: 10.0000 mg | ORAL_TABLET | Freq: Every day | ORAL | 1 refills | Status: DC

## 2016-11-07 MED ORDER — BENAZEPRIL HCL 40 MG PO TABS: 40.0000 mg | ORAL_TABLET | Freq: Every day | ORAL | 1 refills | Status: DC

## 2016-11-07 NOTE — Telephone Encounter (Signed)
Rx's sent to the pharmacy by e-script.//AB/CMA 

## 2016-11-07 NOTE — Progress Notes (Signed)
Patient ID: Katelyn Fisher, female   DOB: [birth year], 66 y.o.   MRN: [id number]    Subjective: This patient presents today complaining of uncomfortable, thickened toenails and walking wearing shoes and is requesting toenail debridement. Patient has a history of diabetes as well as chemotherapy-induced neuropathy Orientated 3  Objective: Vascular: DP and PT pulses 2/4 bilaterally Capillary reflex immediate bilaterally Ankle reflex equal and reactive bilaterally  Neurological: Sensation to 10 g monofilament wire intact 2/5 right and 0/5 left Vibratory sensation nonreactive bilaterally Ankle reflexes reactive bilaterally  Dermatological: No open skin lesions noted bilaterally Absent hair growth and atrophic skin bilaterally The toenails are elongated, brittle, discolored, incurvated 6-10  Musculoskeletal: No deformities noted bilaterally There is no restriction ankle, subtalar, midtarsal joints    Assessment: Satisfactory vascular status 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 The toenails 10 were debrided mechanically and electrically without any bleeding  Reappoint 3 months

## 2016-11-07 NOTE — Patient Instructions (Signed)

## 2016-11-12 ENCOUNTER — Ambulatory Visit
Admission: RE | Admit: 2016-11-12 | Discharge: 2016-11-12 | Disposition: A | Discharge status: Home / Self Care | Payer: Medicare PPO | Source: Ambulatory Visit | Attending: Family Medicine | Admitting: Family Medicine

## 2016-11-12 DIAGNOSIS — Z1231 Encounter for screening mammogram for malignant neoplasm of breast: Secondary | ICD-10-CM | POA: Diagnosis not present

## 2016-11-12 HISTORY — DX: Personal history of antineoplastic chemotherapy: Z92.21

## 2016-11-12 HISTORY — DX: Malignant neoplasm of endometrium: C54.1

## 2016-11-21 ENCOUNTER — Other Ambulatory Visit: Payer: Self-pay | Admitting: Hematology and Oncology

## 2016-11-29 ENCOUNTER — Ambulatory Visit: Payer: Medicare PPO | Attending: Gynecologic Oncology | Admitting: Gynecologic Oncology

## 2016-11-29 ENCOUNTER — Encounter: Payer: Self-pay | Admitting: Gynecologic Oncology

## 2016-11-29 ENCOUNTER — Other Ambulatory Visit: Payer: Self-pay | Admitting: Gynecologic Oncology

## 2016-11-29 VITALS — BP 149/83 | HR 78 | Temp 97.7°F | Resp 18 | Wt 249.0 lb

## 2016-11-29 DIAGNOSIS — K219 Gastro-esophageal reflux disease without esophagitis: Secondary | ICD-10-CM | POA: Diagnosis not present

## 2016-11-29 DIAGNOSIS — C541 Malignant neoplasm of endometrium: Secondary | ICD-10-CM | POA: Insufficient documentation

## 2016-11-29 DIAGNOSIS — Z9851 Tubal ligation status: Secondary | ICD-10-CM | POA: Diagnosis not present

## 2016-11-29 DIAGNOSIS — Z9221 Personal history of antineoplastic chemotherapy: Secondary | ICD-10-CM | POA: Diagnosis not present

## 2016-11-29 DIAGNOSIS — Z923 Personal history of irradiation: Secondary | ICD-10-CM | POA: Diagnosis not present

## 2016-11-29 DIAGNOSIS — Z8249 Family history of ischemic heart disease and other diseases of the circulatory system: Secondary | ICD-10-CM | POA: Insufficient documentation

## 2016-11-29 DIAGNOSIS — I10 Essential (primary) hypertension: Secondary | ICD-10-CM | POA: Insufficient documentation

## 2016-11-29 DIAGNOSIS — Z8042 Family history of malignant neoplasm of prostate: Secondary | ICD-10-CM | POA: Diagnosis not present

## 2016-11-29 DIAGNOSIS — Z9889 Other specified postprocedural states: Secondary | ICD-10-CM | POA: Insufficient documentation

## 2016-11-29 DIAGNOSIS — Z801 Family history of malignant neoplasm of trachea, bronchus and lung: Secondary | ICD-10-CM | POA: Insufficient documentation

## 2016-11-29 DIAGNOSIS — Z6841 Body Mass Index (BMI) 40.0 and over, adult: Secondary | ICD-10-CM

## 2016-11-29 DIAGNOSIS — Z90710 Acquired absence of both cervix and uterus: Secondary | ICD-10-CM | POA: Insufficient documentation

## 2016-11-29 DIAGNOSIS — Z8542 Personal history of malignant neoplasm of other parts of uterus: Secondary | ICD-10-CM

## 2016-11-29 DIAGNOSIS — Z833 Family history of diabetes mellitus: Secondary | ICD-10-CM | POA: Insufficient documentation

## 2016-11-29 DIAGNOSIS — Z803 Family history of malignant neoplasm of breast: Secondary | ICD-10-CM | POA: Diagnosis not present

## 2016-11-29 DIAGNOSIS — Z95828 Presence of other vascular implants and grafts: Secondary | ICD-10-CM

## 2016-11-29 MED ORDER — ALTEPLASE 2 MG IJ SOLR
2.0000 mg | Freq: Once | INTRAMUSCULAR | Status: DC | PRN
  Filled 2016-11-29: qty 2

## 2016-11-29 MED ORDER — HEPARIN SOD (PORK) LOCK FLUSH 100 UNIT/ML IV SOLN
500.0000 [IU] | Freq: Once | INTRAVENOUS | Status: DC
  Filled 2016-11-29: qty 5

## 2016-11-29 MED ORDER — SODIUM CHLORIDE 0.9% FLUSH
10.0000 mL | INTRAVENOUS | Status: DC | PRN
  Filled 2016-11-29: qty 10

## 2016-11-29 NOTE — Patient Instructions (Addendum)
Plan to follow up in one year or sooner if needed.  Please call our office in January or February 2019 to schedule an appointment for May 2019.

## 2016-11-29 NOTE — Progress Notes (Signed)
GYNECOLOGIC ONCOLOGY OFFICE NOTE   Chief Complaint: Uterine serous endometrial cancer. Surveillance   Assessment/Plan: Katelyn Fisher is a 66 y.o. woman with pre op diagnosis of grade 1 endometrial cancer . 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   6//6 cycles adjuvant platinum and taxane chemotherapy and  vaginal brachytherapy completed 12/2014 No evidence of disease Follow-up with Gyn Onc May 2019  Follow-up with Dr. Dellis Filbert November 2018. Pap test should be collected at that visit Follow-up with Dr. Alvy Bimler as scheduled.  Discussed that removal of port now is appropriate.  Gwenn A Vandyke wishes to keep it in place at present.  Advised to contact us if she changes her mind  Obesity:  Advised about weight loss and increased physical activity  History of Present Illness: Katelyn Fisher is a 66 y.o. who presented with a two-week history of postmenopausal bleeding. A pelvic ultrasound showed fibroids and a thickened endometrial stripe. She subsequently underwent an endometrial biopsy which was concerning for at least grade 1 endometrioid adenocarcinoma.   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 [place] 05-07-14 reportedly had thickened endometrial stripe, with 3 fibroids largest 5x 5x 3.5 cm; endometrial biopsy 05-07-14 reportedly was concerning for at least FIGO grade 1 endometrial carcinoma      06/23/2014 Surgery    Surgery was robotic hysterectomy, BSO, bilateral pelvic and para-aortic nodes by Dr Skeet Latch at [facility] on 06-23-14      06/23/2014 Pathology Results    Pathology [phone]) found mixed high grade carcinoma (serous 50% and endometrioid 50%) FIGO grade 3, involving inner half of  myometrium with depth 5 mm where wall 3 cm thick, no serosal or lower uterine segment involvement, no cervical or adnexal involvement, LVSI present, 0/15 nodes involved      07/30/2014 PET scan    PET/ CT at [facility] 07-30-14 had no findings of concern for distant disease      08/16/2014 Procedure    Ultrasound and fluoroscopically guided right internal jugular single lumen power port catheter insertion. Tip in the SVC/RA junction. Catheter ready for use.      08/24/2014 - 12/06/2014 Chemotherapy    She received 6 cycles of carbo/Taxol      11/29/2014 Genetic Testing    Genetics testing normal 11-2014 (comprehensive cancer panel by [facility])      12/17/2014 - 01/03/2015 Radiation Therapy    Radiation treatment dates:   12/17/2014, 12/22/2014, 12/27/2014, 12/31/2014, 01/03/2015  Site/dose:   Proximal 4.5 cm of Vaginal cuff / 30 Gy in 5 fractions Rx to 0.1cm submucosal depth  Beams/energy:   HDR brachytherapy/ Ir 192      03/03/2015 Imaging    Interval hysterectomy. No evidence of local recurrence or metastatic disease. 2. No acute abdominal pelvic findings. 3. Distal colonic diverticulosis with possible rectal wall thickening, possibly related to prior radiation therapy. Correlate clinically.         Pathology: A: Uterus, cervix, bilateral ovaries and fallopian tubes, total hysterectomy and bilateral salpingo-oophorectomy  Histologic type: mixed high grade carcinoma (serous carcinoma 50%, endometrioid adenocarcinoma 50%)   Tumor size (gross): 2.0 cm (microscopic measurement)   Myometrial invasion: Inner half Depth: 5 mm Wall thickness: 3 cm Percent: 17% Lymphovascular space invasion: present  Endometrium: 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) predominantly   - Six lymph nodes negative for metastatic carcinoma (0/6)   C: Lymph nodes, left pelvic, excision  - Nine lymph nodes, negative for metastatic carcinoma (0/9)   Post op PET to assess LN without evidence  of metastatic disease.  Extended cancer gene panel negative.   CT 03/03/2015 IMPRESSION: 1. Interval hysterectomy. No evidence of local recurrence or metastatic disease. 2. No acute abdominal pelvic findings. 3. Distal colonic diverticulosis with possible rectal wall thickening, possibly related to prior radiation therapy. Correlate clinically.   Doing well.  No complaints.  No vaginal bleeding, normal mammogram earlier this year , no abdominal bloating shortness of breath.  Colonoscopy 10/2016 with removal of adenomatous polyps.  Mammogram 10/2016 wnl  Past Medical History:  Past Medical History   Past Medical History   Diagnosis  Date   .  Asthma    .  Morbid obesity ([facility])      BMI 44   .  Hypertension    .  GERD (gastroesophageal reflux disease)    .  Arthritis    .  Depression    .  Back pain    .  Thyroid disease  05/2013   .  Vertigo  10/2012     benign paroxysmal positional       Past Surgical History:  Past Surgical History   Past Surgical History   Procedure  Laterality  Date   .  Dilation and curettage of uterus   2102     for PMB   .  Hysteroscopy   2012     with resection of fibroids   .  Tubal ligation     .  Laparoscopic cholecystectomy     .  Pr laparoscopy w tot hysterectuterus <=250 gram w tube/ovary  Midline  06/23/2014     Procedure: ROBOTIC LAPAROSCOPY, SURGICAL, WITH TOTAL HYSTERECTOMY, FOR UTERUS 250 G OR LESS; W/REMOVAL TUBE(S) AND/OR OVARY(S); Surgeon: Devra Dopp, MD; Location: MAIN OR [facility]; Service: Gynecology Oncology   .  Pr lap,pelvic lymphadenectomy/bx  Bilateral  06/23/2014     Procedure: ROBOTIC LAPAROSCOPY, SURGICAL; WITH BILATERAL TOTAL PELVIC LYMPHADENECTOMY PERI-AORTIC LYMPH NODE SAMPLE, SGL/MULT; Surgeon: Devra Dopp, MD; Location: MAIN OR [facility]; Service: Gynecology Oncology       Family History:  Family History   Family History   Problem  Relation  Age of Onset   .  Heart disease  Maternal Aunt    .  Lung cancer   Father    .  Cancer  Maternal Aunt  92     Breast   .  Cancer  Maternal Aunt  85     Breast   .  Diabetes  Mother    .  Lupus  Mother    .  Diabetes  Father    .  Prostate cancer  Father    .  Diabetes  Brother    .  Heart disease  Brother      ROS: No visual changes, no nausea or vomiting, no sensory or motor changes, no flank pain, no extremity edema, no incontinence, no vaginal bleeding. No change in bowel or bladder habits, Otherwise 10 point ROS negative.   Physical Exam: BP (!) 149/83 (BP Location: Left Arm, Patient Position: Sitting)   Pulse 78   Temp 97.7 F (36.5 C) (Oral)   Resp 18   Wt 249 lb (112.9 kg) Comment: RN notified   BMI  45.54 kg/m   Wt Readings from Last 3 Encounters:  11/29/16 249 lb (112.9 kg)  10/25/16 249 lb 12.8 oz (113.3 kg)  10/24/16 246 lb (111.6 kg)   WD female in NAD Chest: CTA Back: No CVAT Abdomen: Obese. Soft, nondistended, nontender to palpation. No masses or hepatosplenomegaly appreciated. Port sites intact, no tenderness or masses  Pelvic:  Nl EGBUS, no massess, no discharge no bleeding, atropic vagina.  No cul de sac masses. Rectal:  Good tone no masses. Back:  No CVAT LN:  No cervical supraclavicular or inguinal adenopathy Ext No CCE

## 2016-12-26 ENCOUNTER — Ambulatory Visit: Payer: Medicare PPO | Admitting: Family Medicine

## 2017-01-01 ENCOUNTER — Other Ambulatory Visit: Payer: Self-pay | Admitting: Family Medicine

## 2017-01-03 ENCOUNTER — Ambulatory Visit (INDEPENDENT_AMBULATORY_CARE_PROVIDER_SITE_OTHER): Payer: Medicare PPO | Admitting: Family Medicine

## 2017-01-03 ENCOUNTER — Encounter: Payer: Self-pay | Admitting: Family Medicine

## 2017-01-03 VITALS — BP 140/90 | HR 79 | Temp 97.9°F | Ht 62.0 in | Wt 248.6 lb

## 2017-01-03 DIAGNOSIS — E669 Obesity, unspecified: Secondary | ICD-10-CM | POA: Diagnosis not present

## 2017-01-03 DIAGNOSIS — E1169 Type 2 diabetes mellitus with other specified complication: Secondary | ICD-10-CM

## 2017-01-03 DIAGNOSIS — I10 Essential (primary) hypertension: Secondary | ICD-10-CM | POA: Diagnosis not present

## 2017-01-03 LAB — BASIC METABOLIC PANEL
BUN: 12 mg/dL (ref 6–23)
CHLORIDE: 104 meq/L (ref 96–112)
CO2: 29 mEq/L (ref 19–32)
CREATININE: 0.79 mg/dL (ref 0.40–1.20)
Calcium: 9.8 mg/dL (ref 8.4–10.5)
GFR: 93.92 mL/min (ref >60.00)
Glucose, Bld: 101 mg/dL — ABNORMAL HIGH (ref 70–99)
Potassium: 4.1 mEq/L (ref 3.5–5.1)
Sodium: 140 mEq/L (ref 135–145)

## 2017-01-03 LAB — HEMOGLOBIN A1C: HEMOGLOBIN A1C: 6.9 % — AB (ref 4.6–6.5)

## 2017-01-03 MED ORDER — BENAZEPRIL-HYDROCHLOROTHIAZIDE 20-25 MG PO TABS: 1.0000 | ORAL_TABLET | Freq: Every day | ORAL | 1 refills | Status: DC

## 2017-01-03 NOTE — Addendum Note (Signed)
Addended by: Ames Coupe on: 01/03/2017 12:10 PM   Modules accepted: Orders

## 2017-01-03 NOTE — Patient Instructions (Signed)
Don't take the benazepril with the new medicine we have called in for your blood pressure.  Keep checking your BP at home and bring the readings to your next appointment.

## 2017-01-03 NOTE — Progress Notes (Addendum)
Subjective:   Chief Complaint  Patient presents with  . Follow-up    on DM    Katelyn Fisher is a 66 y.o. female here for follow-up of diabetes.   Patient denies hypoglycemic reaction symptoms. She checks her glucose levels 0 times per day. Patient does not require insulin.   Medications include: Metformin 500 mg daily Patient exercises 4 days per week on average.   She does take an aspirin daily. Statin? Yes ACEi/ARB? Yes  Hypertension Patient presents for hypertension follow up. She does monitor home blood pressures. Blood pressures ranging on average from 120's/90's. She is compliant with medications- Coreg 12.5 TID, Norvasc 10 mg daily, benazepril 40 mg daily. Patient has these side effects of medication: none She is adhering to a healthy diet overall. Exercise: walking   Past Medical History:  Diagnosis Date  . Anxiety   . Arthritis    back  . Asthma   . Benign paroxysmal positional vertigo 11/01/2012  . Cancer ([place]) 12/22/2014   endometrial  . Depression   . Endometrial cancer ([place]) 2015  . Family history of breast cancer   . Family history of colon cancer   . Family history of ovarian cancer   . Family history of prostate cancer   . GERD (gastroesophageal reflux disease)   . History of colonic diverticulitis   . Hx of colonic polyps   . Hyperlipidemia   . Hypertension   . 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  . Low back pain radiating to right leg 08/18/2012  . Obesity   . Personal history of chemotherapy 2015  . Radiation 12/17/14-01/03/15   vaginal brachytherapy  . Thyroid disease 06/01/2013    Past Surgical History:  Procedure Laterality Date  . ABDOMINAL HYSTERECTOMY  06/23/14   [facility], TRH/BSO  . CHOLECYSTECTOMY  1991  . DILATION AND CURETTAGE OF UTERUS    . TUBAL LIGATION      Social History   Social History  . Marital status: Married   Occupational History  . retired Network engineer    Social History Main Topics  . Smoking status: Never Smoker   . Smokeless tobacco: Never Used  . Alcohol use No  . Drug use: No  . Sexual activity: Yes    Partners: Male     Comment: lives with husband, no dietary restrictions   G2 P 2   Social History Narrative   Married- 86 years   Never Smoked   Alcohol use-no   Drug use-no   Occupation: housewife   Caffeine use/day:  None   Does Patient Exercise:  yes    Current Outpatient Prescriptions on File Prior to Visit  Medication Sig Dispense Refill  . albuterol (PROVENTIL) (2.5 MG/3ML) 0.083% nebulizer solution Take 3 mLs (2.5 mg total) by nebulization every 4 (four) hours as needed for wheezing or shortness of breath. 150 mL 2  . amLODipine (NORVASC) 10 MG tablet Take 1 tablet (10 mg total) by mouth daily. 90 tablet 1  . aspirin 81 MG tablet Take 81 mg by mouth daily.      Marland Kitchen atorvastatin (LIPITOR) 40 MG tablet Take 1 tablet (40 mg total) by mouth daily. 90 tablet 3  . benazepril (LOTENSIN) 40 MG tablet Take 1 tablet (40 mg total) by mouth daily. 90 tablet 1  . budesonide-formoterol (SYMBICORT) 160-4.5 MCG/ACT inhaler TWO PUFFS TWICE A DAY TO PREVENT COUGH OR WHEEZE. RINSE, GARGLE AND SPIT AFTER USE. 1 Inhaler 5  . carvedilol (COREG) 12.5 MG tablet TAKE 1 TABLET (12.5  MG TOTAL) BY MOUTH 2 (TWO) TIMES DAILY WITH A MEAL. (Patient taking differently: Take 12.5 mg by mouth 3 (three) times daily. ) 180 tablet 2  . chlorpheniramine-HYDROcodone (TUSSIONEX PENNKINETIC ER) 10-8 MG/5ML SUER Take 5 mLs by mouth every 12 (twelve) hours as needed for cough. 120 mL 0  . diphenhydramine-acetaminophen (TYLENOL PM) 25-500 MG TABS Take 1 tablet by mouth at bedtime as needed. Reported on 01/30/2016    . fluticasone (FLOVENT HFA) 110 MCG/ACT inhaler Inhale 2 puffs into the lungs daily. 1 Inhaler 1  . furosemide (LASIX) 20 MG tablet TAKE 1 TABLET EVERY DAY 90 tablet 1  . gabapentin (NEURONTIN) 300 MG capsule Take 1 capsule (300 mg total) by mouth 2 (two) times daily. 60 capsule 9  . levothyroxine (SYNTHROID, LEVOTHROID)  75 MCG tablet Take 1 tablet (75 mcg total) by mouth daily before breakfast. 90 tablet 1  . lidocaine-prilocaine (EMLA) cream Apply 1 application topically as needed. 30 g 0  . metFORMIN (GLUCOPHAGE) 500 MG tablet Take 1 tablet (500 mg total) by mouth daily with breakfast. 30 tablet 3  . Multiple Vitamin (MULTIVITAMIN) tablet Take 1 tablet by mouth daily.    . naproxen (NAPROSYN) 500 MG tablet Take 1 tablet (500 mg total) by mouth 2 (two) times daily with a meal. 20 tablet 0  . pantoprazole (PROTONIX) 40 MG tablet Take 1 tablet (40 mg total) by mouth daily. 90 tablet 2  . Probiotic Product (ALIGN) 4 MG CAPS Take 1 capsule by mouth daily.     Marland Kitchen triamcinolone cream (KENALOG) 0.1 % Apply twice daily as needed to affected areas below face 30 g 3     Related testing: Date of retinal exam: 01/2016  - going to schedule f/u Pneumovax: done Flu Shot: done  Review of Systems: Eye:  No recent significant change in vision Pulmonary:  No SOB Cardiovascular:  No chest pain, no palpitations Skin/Integumentary ROS:  No abnormal skin lesions reported Neurologic:  No numbness, tingling  Objective:  BP 140/90 (BP Location: Left Arm, Patient Position: Sitting, Cuff Size: Large)   Pulse 79   Temp 97.9 F (36.6 C) (Oral)   Ht 5\' 2"  (1.575 m)   Wt 248 lb 9.6 oz (112.8 kg)   SpO2 99%   BMI 45.47 kg/m  General:  Well developed, well nourished, in no apparent distress Skin:  Warm, no pallor or diaphoresis Head:  Normocephalic, atraumatic Eyes:  Pupils equal and round, sclera anicteric without injection  Nose:  External nares without trauma, no discharge Throat/Pharynx:  Lips and gingiva without lesion Neck: Neck supple.  No obvious thyromegaly or masses.  No bruits Lungs:  clear to auscultation, breath sounds equal bilaterally, no wheezes, rales, or stridor Cardio:  regular rate and rhythm without murmurs, no bruits, no LE edema Abdomen:  Abdomen soft, non-tender, BS normal Musculoskeletal:   Symmetrical muscle groups noted without atrophy or deformity Neuro:  Sensation intact to pinprick on feet Psych: Age appropriate judgment and insight  Assessment:   Diabetes mellitus type 2 in obese ([place]) - Plan: Hemoglobin A1c, Amb ref to Medical Nutrition Therapy-MNT  Essential hypertension - Plan: benazepril-hydrochlorthiazide (LOTENSIN HCT) 20-25 MG tablet, Basic metabolic panel, Basic metabolic panel   Plan:  Hypertension status: uncontrolled  Orders as above. Add HCTZ, check labs in 1 week. BP monitoring, bring log to appt in 1 mo to recheck HTN. F/u for DM pending results of A1c. The patient voiced understanding and agreement to the plan.  [place], DO  01/03/17 12:10 PM

## 2017-01-10 ENCOUNTER — Other Ambulatory Visit (INDEPENDENT_AMBULATORY_CARE_PROVIDER_SITE_OTHER): Payer: Medicare PPO

## 2017-01-10 ENCOUNTER — Encounter: Payer: Self-pay | Admitting: Family Medicine

## 2017-01-10 DIAGNOSIS — I10 Essential (primary) hypertension: Secondary | ICD-10-CM

## 2017-01-10 LAB — BASIC METABOLIC PANEL
BUN: 10 mg/dL (ref 6–23)
CO2: 30 meq/L (ref 19–32)
Calcium: 9.4 mg/dL (ref 8.4–10.5)
Chloride: 104 mEq/L (ref 96–112)
Creatinine, Ser: 0.88 mg/dL (ref 0.40–1.20)
GFR: 82.92 mL/min (ref >60.00)
Glucose, Bld: 115 mg/dL — ABNORMAL HIGH (ref 70–99)
Potassium: 3.9 mEq/L (ref 3.5–5.1)
SODIUM: 140 meq/L (ref 135–145)

## 2017-01-11 NOTE — Telephone Encounter (Signed)
Please advise.//AB/CMA 

## 2017-01-16 ENCOUNTER — Other Ambulatory Visit: Payer: Self-pay | Admitting: *Deleted

## 2017-01-16 ENCOUNTER — Other Ambulatory Visit: Payer: Self-pay

## 2017-01-16 DIAGNOSIS — E1169 Type 2 diabetes mellitus with other specified complication: Secondary | ICD-10-CM

## 2017-01-16 DIAGNOSIS — E669 Obesity, unspecified: Principal | ICD-10-CM

## 2017-01-16 MED ORDER — GABAPENTIN 300 MG PO CAPS: 300.0000 mg | ORAL_CAPSULE | Freq: Two times a day (BID) | ORAL | 1 refills | Status: DC

## 2017-01-16 MED ORDER — ATORVASTATIN CALCIUM 40 MG PO TABS: 40.0000 mg | ORAL_TABLET | Freq: Every day | ORAL | 0 refills | Status: DC

## 2017-01-16 MED ORDER — CARVEDILOL 12.5 MG PO TABS: 12.5000 mg | ORAL_TABLET | Freq: Two times a day (BID) | ORAL | 0 refills | Status: DC

## 2017-01-16 MED ORDER — METFORMIN HCL 500 MG PO TABS: 500.0000 mg | ORAL_TABLET | Freq: Every day | ORAL | 1 refills | Status: DC

## 2017-01-16 NOTE — Telephone Encounter (Signed)
Rx's approved and sent to the pharmacy.//AB/CMA 

## 2017-01-28 ENCOUNTER — Encounter: Payer: Self-pay | Admitting: Pediatrics

## 2017-01-28 ENCOUNTER — Ambulatory Visit (INDEPENDENT_AMBULATORY_CARE_PROVIDER_SITE_OTHER): Payer: Medicare PPO | Admitting: Pediatrics

## 2017-01-28 VITALS — BP 110/78 | HR 88 | Temp 97.9°F | Resp 20

## 2017-01-28 DIAGNOSIS — K219 Gastro-esophageal reflux disease without esophagitis: Secondary | ICD-10-CM

## 2017-01-28 DIAGNOSIS — J4520 Mild intermittent asthma, uncomplicated: Secondary | ICD-10-CM

## 2017-01-28 DIAGNOSIS — C541 Malignant neoplasm of endometrium: Secondary | ICD-10-CM

## 2017-01-28 DIAGNOSIS — I10 Essential (primary) hypertension: Secondary | ICD-10-CM

## 2017-01-28 NOTE — Patient Instructions (Addendum)
Continue on the present treatment plan Call me if you're not doing well on this treatment plan Continue avoiding shellfish

## 2017-01-28 NOTE — Progress Notes (Signed)
  [place] [id number] Dept: [phone]  FOLLOW UP NOTE  Patient ID: Katelyn Fisher, female    DOB: September 14, 1950  Age: 66 y.o. MRN: [id number] Date of Office Visit: 01/28/2017  Assessment  Chief Complaint: Cough (just a little bit.)  HPI Katelyn Fisher presents for follow-up of asthma and allergic rhinitis.. Her asthma is well controlled. She only needed to use albuterol in a nebulizer in the springtime. She has finished chemotherapy for endometrial cancer. She continues to avoid shellfish. Her diabetes is well controlled  Current medications are Symbicort 160-2 puffs every 12 hours if needed, Pro-air 2 puffs every 4 hours if needed, fluticasone 2 sprays per nostril once a day if needed, albuterol 0.083% one unit dose every 4 hours if needed, pantoprazole 40 mg once a day , triamcinolone 0.1% cream once a day if needed to red itchy areas below the face. Her other medications are outlined in the chart   Drug Allergies:  Allergies  Allergen Reactions  . Oxycodone Swelling    Facial swelling and tightness  . Penicillins Swelling    Rash with welps  . Shellfish Allergy Hives, Shortness Of Breath and Swelling  . Iodine     Rash  . Sulfa Antibiotics Rash    Physical Exam: BP 110/78 (BP Location: Left Arm, Patient Position: Sitting, Cuff Size: Normal)   Pulse 88   Temp 97.9 F (36.6 C) (Oral)   Resp 20   SpO2 98%    Physical Exam  Constitutional: She is oriented to person, place, and time. She appears well-developed and well-nourished.  HENT:  Eyes normal. Ears normal. Nose normal. Pharynx normal.  Neck: Neck supple.  Cardiovascular:  S1 and S2 normal no murmurs  Pulmonary/Chest:  Clear to percussion and auscultation  Lymphadenopathy:    She has no cervical adenopathy.  Neurological: She is alert and oriented to person, place, and time.  Psychiatric: She has a normal mood and affect. Her behavior is normal. Judgment and thought content normal.    Vitals reviewed.   Diagnostics:  FVC 1.83 L FEV1 1.42 L. Predicted FVC 2.28 L predicted FEV1 1.77 L-the spirometry is in the normal range  Assessment and Plan: 1. Mild intermittent asthma without complication   2. Gastroesophageal reflux disease without esophagitis   3. Endometrial cancer ([place])   4. Essential hypertension        Patient Instructions  Continue on the present treatment plan Call me if you're not doing well on this treatment plan Continue avoiding shellfish   Return in about 6 months (around 07/31/2017).    Thank you for the opportunity to care for this patient.  Please do not hesitate to contact me with questions.  Penne Lash, M.D.  [facility] [address] [place], [place] [address] [phone]

## 2017-01-30 ENCOUNTER — Encounter: Payer: Self-pay | Admitting: Family Medicine

## 2017-02-06 ENCOUNTER — Ambulatory Visit (HOSPITAL_BASED_OUTPATIENT_CLINIC_OR_DEPARTMENT_OTHER): Payer: Medicare PPO

## 2017-02-06 ENCOUNTER — Encounter: Payer: Self-pay | Admitting: Podiatry

## 2017-02-06 ENCOUNTER — Ambulatory Visit (INDEPENDENT_AMBULATORY_CARE_PROVIDER_SITE_OTHER): Payer: Medicare PPO | Admitting: Podiatry

## 2017-02-06 DIAGNOSIS — G620 Drug-induced polyneuropathy: Secondary | ICD-10-CM

## 2017-02-06 DIAGNOSIS — B351 Tinea unguium: Secondary | ICD-10-CM | POA: Diagnosis not present

## 2017-02-06 DIAGNOSIS — C541 Malignant neoplasm of endometrium: Secondary | ICD-10-CM

## 2017-02-06 DIAGNOSIS — E1140 Type 2 diabetes mellitus with diabetic neuropathy, unspecified: Secondary | ICD-10-CM | POA: Diagnosis not present

## 2017-02-06 DIAGNOSIS — T451X5A Adverse effect of antineoplastic and immunosuppressive drugs, initial encounter: Secondary | ICD-10-CM

## 2017-02-06 DIAGNOSIS — Z452 Encounter for adjustment and management of vascular access device: Secondary | ICD-10-CM

## 2017-02-06 DIAGNOSIS — Z95828 Presence of other vascular implants and grafts: Secondary | ICD-10-CM

## 2017-02-06 MED ORDER — HEPARIN SOD (PORK) LOCK FLUSH 100 UNIT/ML IV SOLN
500.0000 [IU] | Freq: Once | INTRAVENOUS | Status: AC | PRN
  Administered 2017-02-06: 500 [IU] via INTRAVENOUS
  Filled 2017-02-06: qty 5

## 2017-02-06 MED ORDER — SODIUM CHLORIDE 0.9% FLUSH
10.0000 mL | INTRAVENOUS | Status: DC | PRN
  Administered 2017-02-06: 10 mL via INTRAVENOUS
  Filled 2017-02-06: qty 10

## 2017-02-06 NOTE — Progress Notes (Signed)
Patient ID: Katelyn Fisher, female   DOB: 1950/08/15, 66 y.o.   MRN: [id number]    Subjective: This patient presents today complaining of uncomfortable, thickened toenails and walking wearing shoes and is requesting toenail debridement. Patient has a history of diabetes as well as chemotherapy-induced neuropathy Orientated 3  Objective: Vascular: DP and PT pulses 2/4 bilaterally Capillary reflex immediate bilaterally Ankle reflex equal and reactive bilaterally  Neurological: Sensation to 10 g monofilament wire intact 2/5 right and 0/5 left Vibratory sensation nonreactive bilaterally Ankle reflexes reactive bilaterally  Dermatological: No open skin lesions noted bilaterally Absent hair growth and atrophic skin bilaterally The toenails are elongated, brittle, discolored, incurvated 6-10  Musculoskeletal: No deformities noted bilaterally There is no restriction ankle, subtalar, midtarsal joints    Assessment: Satisfactory vascular status 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 The toenails 10 were debrided mechanically and electrically with slight bleeding distal left hallux, treated with topical antibiotic ointment and Band-Aid. Patient instruction Band-Aid 1-3 days and continue apply topical antibiotic ointment and Band-Aid until a scab forms  Reappoint 3 months

## 2017-02-06 NOTE — Patient Instructions (Signed)
Removed Band-Aid on first left toe 1-3 days and continue applying topical antibiotic ointment and Band-Aid daily until a scab forms

## 2017-02-07 ENCOUNTER — Ambulatory Visit: Payer: Medicare PPO | Admitting: Family Medicine

## 2017-02-22 ENCOUNTER — Other Ambulatory Visit: Payer: Self-pay | Admitting: Hematology and Oncology

## 2017-03-24 ENCOUNTER — Other Ambulatory Visit: Payer: Self-pay | Admitting: Family Medicine

## 2017-03-28 ENCOUNTER — Telehealth: Payer: Self-pay | Admitting: *Deleted

## 2017-03-28 NOTE — Telephone Encounter (Signed)
Pt called c/o UTI blood in urine, burning with urination, urgency. Work in to be seen today or willing to prescribe a Rx to pharmacy? Pt has allergy to sulfa causes rash. Please advise

## 2017-03-28 NOTE — Telephone Encounter (Signed)
I called pt back to check on her and pt said no more blood in urine, still has burning with urination and urgency

## 2017-03-29 MED ORDER — NITROFURANTOIN MONOHYD MACRO 100 MG PO CAPS: 100.0000 mg | ORAL_CAPSULE | Freq: Two times a day (BID) | ORAL | 0 refills | Status: DC

## 2017-03-29 NOTE — Telephone Encounter (Signed)
Can send MacroBID 1 tab PO BID x 5 days.  If no improvement by Monday, work in then.

## 2017-03-29 NOTE — Telephone Encounter (Signed)
Pt informed, Rx sent. 

## 2017-04-11 ENCOUNTER — Other Ambulatory Visit: Payer: Self-pay | Admitting: Hematology and Oncology

## 2017-04-11 ENCOUNTER — Ambulatory Visit (HOSPITAL_BASED_OUTPATIENT_CLINIC_OR_DEPARTMENT_OTHER): Payer: Medicare PPO

## 2017-04-11 DIAGNOSIS — Z95828 Presence of other vascular implants and grafts: Secondary | ICD-10-CM

## 2017-04-11 DIAGNOSIS — C541 Malignant neoplasm of endometrium: Secondary | ICD-10-CM

## 2017-04-11 DIAGNOSIS — Z452 Encounter for adjustment and management of vascular access device: Secondary | ICD-10-CM | POA: Diagnosis not present

## 2017-04-11 MED ORDER — HEPARIN SOD (PORK) LOCK FLUSH 100 UNIT/ML IV SOLN
500.0000 [IU] | Freq: Once | INTRAVENOUS | Status: AC | PRN
  Administered 2017-04-11: 500 [IU] via INTRAVENOUS
  Filled 2017-04-11: qty 5

## 2017-04-11 MED ORDER — SODIUM CHLORIDE 0.9% FLUSH
10.0000 mL | INTRAVENOUS | Status: DC | PRN
  Administered 2017-04-11: 10 mL via INTRAVENOUS
  Filled 2017-04-11: qty 10

## 2017-04-17 ENCOUNTER — Ambulatory Visit (INDEPENDENT_AMBULATORY_CARE_PROVIDER_SITE_OTHER): Payer: Medicare PPO | Admitting: Family Medicine

## 2017-04-17 ENCOUNTER — Encounter: Payer: Self-pay | Admitting: Family Medicine

## 2017-04-17 VITALS — BP 132/80 | HR 69 | Temp 97.9°F | Ht 62.0 in | Wt 242.5 lb

## 2017-04-17 DIAGNOSIS — I10 Essential (primary) hypertension: Secondary | ICD-10-CM

## 2017-04-17 NOTE — Progress Notes (Signed)
Pre visit review using our clinic review tool, if applicable. No additional management support is needed unless otherwise documented below in the visit note. 

## 2017-04-17 NOTE — Patient Instructions (Signed)
Let me find out about a closer nutritionist.  Keep up the good work.

## 2017-04-17 NOTE — Progress Notes (Signed)
Chief Complaint  Patient presents with  . Follow-up    blood pressure    Subjective Katelyn Fisher is a 66 y.o. female who presents for hypertension follow up. She does monitor home blood pressures. Blood pressures ranging from 120's/70-80's on average. She is compliant with medications- amlodipine 10 mg daily, Coreg 12.5 mg daily, Lotensin 20-25 mg daily. HCTZ was added to benazepril at last visit, her home BPs have come down.  Patient has these side effects of medication: none She is adhering to a healthy diet overall. Current exercise: walking   Past Medical History:  Diagnosis Date  . Anxiety   . Arthritis    back  . Asthma   . Benign paroxysmal positional vertigo 11/01/2012  . Cancer ([place]) 12/22/2014   endometrial  . Depression   . Endometrial cancer ([place]) 2015  . Family history of breast cancer   . Family history of colon cancer   . Family history of ovarian cancer   . Family history of prostate cancer   . GERD (gastroesophageal reflux disease)   . History of colonic diverticulitis   . Hx of colonic polyps   . Hyperlipidemia   . Hypertension   . 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  . Low back pain radiating to right leg 08/18/2012  . Obesity   . Personal history of chemotherapy 2015  . Radiation 12/17/14-01/03/15   vaginal brachytherapy  . Thyroid disease 06/01/2013   Medications Current Outpatient Prescriptions on File Prior to Visit  Medication Sig Dispense Refill  . albuterol (PROVENTIL) (2.5 MG/3ML) 0.083% nebulizer solution Take 3 mLs (2.5 mg total) by nebulization every 4 (four) hours as needed for wheezing or shortness of breath. 150 mL 2  . amLODipine (NORVASC) 10 MG tablet Take 1 tablet (10 mg total) by mouth daily. 90 tablet 1  . aspirin 81 MG tablet Take 81 mg by mouth daily.      Marland Kitchen atorvastatin (LIPITOR) 40 MG tablet Take 1 tablet (40 mg total) by mouth daily. 90 tablet 0  . benazepril (LOTENSIN) 40 MG tablet Take 1 tablet (40 mg total) by mouth daily. 90  tablet 1  . benazepril-hydrochlorthiazide (LOTENSIN HCT) 20-25 MG tablet Take 1 tablet by mouth daily. 90 tablet 1  . budesonide-formoterol (SYMBICORT) 160-4.5 MCG/ACT inhaler TWO PUFFS TWICE A DAY TO PREVENT COUGH OR WHEEZE. RINSE, GARGLE AND SPIT AFTER USE. 1 Inhaler 5  . carvedilol (COREG) 12.5 MG tablet TAKE 1 TABLET (12.5 MG TOTAL) BY MOUTH 2 (TWO) TIMES DAILY WITH A MEAL. 180 tablet 2  . diphenhydramine-acetaminophen (TYLENOL PM) 25-500 MG TABS Take 1 tablet by mouth at bedtime as needed. Reported on 01/30/2016    . fluticasone (FLOVENT HFA) 110 MCG/ACT inhaler Inhale 2 puffs into the lungs daily. 1 Inhaler 1  . furosemide (LASIX) 20 MG tablet TAKE 1 TABLET EVERY DAY 90 tablet 1  . gabapentin (NEURONTIN) 300 MG capsule Take 1 capsule (300 mg total) by mouth 2 (two) times daily. 90 capsule 1  . levothyroxine (SYNTHROID, LEVOTHROID) 75 MCG tablet Take 1 tablet (75 mcg total) by mouth daily before breakfast. 90 tablet 1  . metFORMIN (GLUCOPHAGE) 500 MG tablet Take 1 tablet (500 mg total) by mouth daily with breakfast. 90 tablet 1  . Multiple Vitamin (MULTIVITAMIN) tablet Take 1 tablet by mouth daily.    . naproxen (NAPROSYN) 500 MG tablet Take 1 tablet (500 mg total) by mouth 2 (two) times daily with a meal. 20 tablet 0  . pantoprazole (PROTONIX) 40 MG tablet Take  1 tablet (40 mg total) by mouth daily. 90 tablet 2  . Probiotic Product (ALIGN) 4 MG CAPS Take 1 capsule by mouth daily.     Marland Kitchen triamcinolone cream (KENALOG) 0.1 % Apply twice daily as needed to affected areas below face 30 g 3   Review of Systems Cardiovascular: no chest pain  Exam BP 132/80 (BP Location: Left Arm, Patient Position: Sitting, Cuff Size: Large)   Pulse 69   Temp 97.9 F (36.6 C) (Oral)   Ht 5\' 2"  (1.575 m)   Wt 242 lb 8 oz (110 kg)   SpO2 97%   BMI 44.35 kg/m  General:  well developed, well nourished, in no apparent distress Heart :RRR, no bruits Lungs:  clear to auscultation, no accessory muscle use Psych:  well oriented with normal range of affect and appropriate judgment/insight  Essential hypertension  Cont current regimen. Doing well.  F/u in 3 mo for DM visit. The patient voiced understanding and agreement to the plan.  [place], DO 04/17/17  9:37 AM

## 2017-04-29 ENCOUNTER — Other Ambulatory Visit: Payer: Self-pay | Admitting: General Surgery

## 2017-04-30 ENCOUNTER — Ambulatory Visit (HOSPITAL_COMMUNITY)
Admission: RE | Admit: 2017-04-30 | Discharge: 2017-04-30 | Disposition: A | Discharge status: Home / Self Care | Payer: Medicare PPO | Source: Ambulatory Visit | Attending: Hematology and Oncology | Admitting: Hematology and Oncology

## 2017-04-30 ENCOUNTER — Encounter (HOSPITAL_COMMUNITY): Payer: Self-pay

## 2017-04-30 DIAGNOSIS — Z79899 Other long term (current) drug therapy: Secondary | ICD-10-CM | POA: Diagnosis not present

## 2017-04-30 DIAGNOSIS — Z880 Allergy status to penicillin: Secondary | ICD-10-CM | POA: Insufficient documentation

## 2017-04-30 DIAGNOSIS — E785 Hyperlipidemia, unspecified: Secondary | ICD-10-CM | POA: Insufficient documentation

## 2017-04-30 DIAGNOSIS — Z452 Encounter for adjustment and management of vascular access device: Secondary | ICD-10-CM | POA: Insufficient documentation

## 2017-04-30 DIAGNOSIS — Z7984 Long term (current) use of oral hypoglycemic drugs: Secondary | ICD-10-CM | POA: Diagnosis not present

## 2017-04-30 DIAGNOSIS — Z5111 Encounter for antineoplastic chemotherapy: Secondary | ICD-10-CM | POA: Diagnosis not present

## 2017-04-30 DIAGNOSIS — F329 Major depressive disorder, single episode, unspecified: Secondary | ICD-10-CM | POA: Diagnosis not present

## 2017-04-30 DIAGNOSIS — Z7982 Long term (current) use of aspirin: Secondary | ICD-10-CM | POA: Insufficient documentation

## 2017-04-30 DIAGNOSIS — M1990 Unspecified osteoarthritis, unspecified site: Secondary | ICD-10-CM | POA: Diagnosis not present

## 2017-04-30 DIAGNOSIS — Z8589 Personal history of malignant neoplasm of other organs and systems: Secondary | ICD-10-CM | POA: Insufficient documentation

## 2017-04-30 DIAGNOSIS — C541 Malignant neoplasm of endometrium: Secondary | ICD-10-CM

## 2017-04-30 DIAGNOSIS — Z882 Allergy status to sulfonamides status: Secondary | ICD-10-CM | POA: Diagnosis not present

## 2017-04-30 DIAGNOSIS — Z91013 Allergy to seafood: Secondary | ICD-10-CM | POA: Insufficient documentation

## 2017-04-30 DIAGNOSIS — Z8542 Personal history of malignant neoplasm of other parts of uterus: Secondary | ICD-10-CM | POA: Diagnosis not present

## 2017-04-30 DIAGNOSIS — I10 Essential (primary) hypertension: Secondary | ICD-10-CM | POA: Diagnosis not present

## 2017-04-30 HISTORY — PX: IR REMOVAL TUN ACCESS W/ PORT W/O FL MOD SED: IMG2290

## 2017-04-30 LAB — PROTIME-INR
INR: 0.94
PROTHROMBIN TIME: 12.5 s (ref 11.4–15.2)

## 2017-04-30 LAB — CBC
HCT: 38.4 % (ref 36.0–46.0)
Hemoglobin: 12.1 g/dL (ref 12.0–15.0)
MCH: 21.4 pg — ABNORMAL LOW (ref 26.0–34.0)
MCHC: 31.5 g/dL (ref 30.0–36.0)
MCV: 67.8 fL — ABNORMAL LOW (ref 78.0–100.0)
Platelets: 198 10*3/uL (ref 150–400)
RBC: 5.66 MIL/uL — ABNORMAL HIGH (ref 3.87–5.11)
RDW: 15.6 % — ABNORMAL HIGH (ref 11.5–15.5)
WBC: 5.2 10*3/uL (ref 4.0–10.5)

## 2017-04-30 LAB — GLUCOSE, CAPILLARY: Glucose-Capillary: 120 mg/dL — ABNORMAL HIGH (ref 65–99)

## 2017-04-30 MED ORDER — SODIUM CHLORIDE 0.9 % IV SOLN
INTRAVENOUS | Status: DC
  Administered 2017-04-30: 08:00:00 via INTRAVENOUS

## 2017-04-30 MED ORDER — LIDOCAINE-EPINEPHRINE (PF) 2 %-1:200000 IJ SOLN
INTRAMUSCULAR | Status: AC
  Filled 2017-04-30: qty 20

## 2017-04-30 MED ORDER — VANCOMYCIN HCL IN DEXTROSE 1-5 GM/200ML-% IV SOLN
INTRAVENOUS | Status: AC
  Administered 2017-04-30: 1000 mg via INTRAVENOUS
  Filled 2017-04-30: qty 200

## 2017-04-30 MED ORDER — LIDOCAINE-EPINEPHRINE (PF) 1 %-1:200000 IJ SOLN
INTRAMUSCULAR | Status: AC | PRN
  Administered 2017-04-30: 10 mL

## 2017-04-30 MED ORDER — FENTANYL CITRATE (PF) 100 MCG/2ML IJ SOLN
INTRAMUSCULAR | Status: AC
  Filled 2017-04-30: qty 2

## 2017-04-30 MED ORDER — VANCOMYCIN HCL IN DEXTROSE 1-5 GM/200ML-% IV SOLN
1000.0000 mg | INTRAVENOUS | Status: AC
  Administered 2017-04-30: 1000 mg via INTRAVENOUS

## 2017-04-30 MED ORDER — MIDAZOLAM HCL 2 MG/2ML IJ SOLN
INTRAMUSCULAR | Status: AC
  Filled 2017-04-30: qty 2

## 2017-04-30 MED ORDER — FENTANYL CITRATE (PF) 100 MCG/2ML IJ SOLN
INTRAMUSCULAR | Status: AC | PRN
  Administered 2017-04-30 (×2): 50 ug via INTRAVENOUS

## 2017-04-30 MED ORDER — MIDAZOLAM HCL 2 MG/2ML IJ SOLN
INTRAMUSCULAR | Status: AC | PRN
  Administered 2017-04-30 (×2): 1 mg via INTRAVENOUS

## 2017-04-30 NOTE — Procedures (Signed)
Pre Procedural Dx: Poor venous access Post Procedural Dx: Same  Successful removal of anterior chest wall port-a-cath.  EBL: Minimal  No immediate post procedural complications.   Jay Jacora Hopkins, MD Pager #: [phone]   

## 2017-04-30 NOTE — H&P (Signed)
Chief Complaint: Patient was seen in consultation today for port removal at the request of [place]  Referring Physician(s): Heath Lark  Supervising Physician: Sandi Mariscal  Patient Status: [facility] - Out-pt  History of Present Illness: Katelyn Fisher is a 66 y.o. female with hx of endometrial cancer. She has completed treatment and is referred for port removal. Her port was placed 08/16/2014 and she reports having no issues with it. PMHx, meds, labs, imaging, allergies reviewed. She feels well, no recent illness, fevers. Has been NPO this am. Husband at bedside  Past Medical History:  Diagnosis Date  . Anxiety   . Arthritis    back  . Asthma   . Benign paroxysmal positional vertigo 11/01/2012  . Cancer ([place]) 12/22/2014   endometrial  . Depression   . Endometrial cancer ([place]) 2015  . Family history of breast cancer   . Family history of colon cancer   . Family history of ovarian cancer   . Family history of prostate cancer   . GERD (gastroesophageal reflux disease)   . History of colonic diverticulitis   . Hx of colonic polyps   . Hyperlipidemia   . Hypertension   . 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  . Low back pain radiating to right leg 08/18/2012  . Obesity   . Personal history of chemotherapy 2015  . Radiation 12/17/14-01/03/15   vaginal brachytherapy  . Thyroid disease 06/01/2013    Past Surgical History:  Procedure Laterality Date  . ABDOMINAL HYSTERECTOMY  06/23/14   [facility], TRH/BSO  . CHOLECYSTECTOMY  1991  . DILATION AND CURETTAGE OF UTERUS    . TUBAL LIGATION      Allergies: Oxycodone; Penicillins; Shellfish allergy; Iodine; and Sulfa antibiotics  Medications: Prior to Admission medications   Medication Sig Start Date End Date Taking? Authorizing Provider  amLODipine (NORVASC) 10 MG tablet Take 1 tablet (10 mg total) by mouth daily. 11/07/16  Yes Shelda Pal, DO  aspirin 81 MG tablet Take 81 mg by mouth daily.     Yes [provider]  atorvastatin (LIPITOR) 40 MG tablet Take 1 tablet (40 mg total) by mouth daily. 01/16/17  Yes Shelda Pal, DO  benazepril-hydrochlorthiazide (LOTENSIN HCT) 20-25 MG tablet Take 1 tablet by mouth daily. 01/03/17  Yes Shelda Pal, DO  carvedilol (COREG) 12.5 MG tablet TAKE 1 TABLET (12.5 MG TOTAL) BY MOUTH 2 (TWO) TIMES DAILY WITH A MEAL. 03/26/17  Yes Mosie Lukes, MD  furosemide (LASIX) 20 MG tablet TAKE 1 TABLET EVERY DAY 01/01/17  Yes Mosie Lukes, MD  gabapentin (NEURONTIN) 300 MG capsule Take 1 capsule (300 mg total) by mouth 2 (two) times daily. 01/16/17  Yes Heath Lark, MD  levothyroxine (SYNTHROID, LEVOTHROID) 75 MCG tablet Take 1 tablet (75 mcg total) by mouth daily before breakfast. 04/12/16  Yes Mosie Lukes, MD  metFORMIN (GLUCOPHAGE) 500 MG tablet Take 1 tablet (500 mg total) by mouth daily with breakfast. 01/16/17  Yes Alvy Bimler, Ni, MD  Multiple Vitamin (MULTIVITAMIN) tablet Take 1 tablet by mouth daily.   Yes [provider]  triamcinolone cream (KENALOG) 0.1 % Apply twice daily as needed to affected areas below face 07/30/16  Yes Bardelas, Jose A, MD  albuterol (PROVENTIL) (2.5 MG/3ML) 0.083% nebulizer solution Take 3 mLs (2.5 mg total) by nebulization every 4 (four) hours as needed for wheezing or shortness of breath. 07/30/16   Charlies Silvers, MD  budesonide-formoterol (SYMBICORT) 160-4.5 MCG/ACT inhaler TWO PUFFS TWICE A DAY TO PREVENT  COUGH OR WHEEZE. RINSE, GARGLE AND SPIT AFTER USE. 01/26/16   Charlies Silvers, MD  diphenhydramine-acetaminophen (TYLENOL PM) 25-500 MG TABS Take 1 tablet by mouth at bedtime as needed. Reported on 01/30/2016    [provider]  fluticasone (FLOVENT HFA) 110 MCG/ACT inhaler Inhale 2 puffs into the lungs daily. 05/31/16   Shelda Pal, DO  naproxen (NAPROSYN) 500 MG tablet Take 1 tablet (500 mg total) by mouth 2 (two) times daily with a meal. 05/07/16   Wendling, Crosby Oyster, DO    pantoprazole (PROTONIX) 40 MG tablet Take 1 tablet (40 mg total) by mouth daily. 07/30/13   Mosie Lukes, MD  Probiotic Product (ALIGN) 4 MG CAPS Take 1 capsule by mouth daily.     [provider]     Family History  Problem Relation Age of Onset  . Heart disease Maternal Aunt        x 3 -2 brothers  . Hypertension Maternal Aunt   . Breast cancer Maternal Aunt        maternal half; dx in her 52s  . Ovarian cancer Maternal Aunt        dx in her 56s  . Prostate cancer Father 53  . Diabetes Father   . Cancer Father        lung cancer, asbestos exposure  . Heart disease Brother   . Diabetes Brother   . Diabetes Mother   . Lupus Mother   . Heart disease Brother   . Hyperlipidemia Sister   . Heart disease Brother   . Diabetes Brother   . Colon polyps Brother   . Heart disease Brother   . Diabetes Brother   . Colon polyps Brother   . Colon cancer Maternal Grandmother        dx in her 103s  . Leukemia Maternal Uncle 21  . Cancer Paternal Aunt        NOS  . Prostate cancer Paternal Uncle        dx in his 86s  . Breast cancer Other        dx in her 23s    Social History   Social History  . Marital status: Married    Spouse name: N/A  . Number of children: 3  . Years of education: N/A   Occupational History  . retired Network engineer    Social History Main Topics  . Smoking status: Never Smoker  . Smokeless tobacco: Never Used  . Alcohol use No  . Drug use: No  . Sexual activity: Yes    Partners: Male     Comment: lives with husband, no dietary restrictions   G2 P 2   Other Topics Concern  . None   Social History Narrative   Married- 76 years   Never Smoked   Alcohol use-no   Drug use-no   Occupation: housewife   Caffeine use/day:  None   Does Patient Exercise:  yes    Review of Systems: A 12 point ROS discussed and pertinent positives are indicated in the HPI above.  All other systems are negative.  Review of Systems  Vital Signs: BP (!) 141/90  (BP Location: Left Arm)   Pulse 86   Temp 98.1 F (36.7 C) (Oral)   Resp 18   Ht 5\' 2"  (1.575 m)   Wt 242 lb (109.8 kg)   SpO2 99%   BMI 44.26 kg/m   Physical Exam  Constitutional: She is oriented to person, place,  and time. She appears well-developed. No distress.  HENT:  Head: Normocephalic.  Mouth/Throat: Oropharynx is clear and moist.  Neck: Normal range of motion. No JVD present. No tracheal deviation present.  Cardiovascular: Normal rate, regular rhythm and normal heart sounds.   Pulmonary/Chest: Effort normal and breath sounds normal. No respiratory distress.  Neurological: She is alert and oriented to person, place, and time.  Skin: Skin is warm and dry.  (R)chest port palpable, incision well healed.  Psychiatric: She has a normal mood and affect. Judgment normal.    Imaging: No results found.  Labs:  CBC:  Recent Labs  05/10/16 0838 09/24/16 0929 04/30/17 0743  WBC 5.9 5.8 5.2  HGB 11.6 12.3 12.1  HCT 37.7 39.8 38.4  PLT 201 214.0 198    COAGS:  Recent Labs  04/30/17 0743  INR 0.94    BMP:  Recent Labs  05/10/16 0838 09/24/16 0929 01/03/17 1042 01/10/17 0820  NA 144 143 140 140  K 3.5 4.4 4.1 3.9  CL  --  107 104 104  CO2 27 26 29 30   GLUCOSE 110 120* 101* 115*  BUN 16.6 8 12 10   CALCIUM 9.2 9.4 9.8 9.4  CREATININE 0.8 0.76 0.79 0.88    LIVER FUNCTION TESTS:  Recent Labs  05/10/16 0838 09/24/16 0929  BILITOT 0.22 0.3  AST 18 18  ALT 21 19  ALKPHOS 104 117  PROT 6.9 7.2  ALBUMIN 3.6 4.0    TUMOR MARKERS: No results for input(s): AFPTM, CEA, CA199, CHROMGRNA in the last 8760 hours.  Assessment and Plan: Endometrial cancer For port removal Labs reviewed, ok Risks and benefits discussed with the patient including, but not limited to bleeding, infection, poor wound healing. All of the patient's questions were answered, patient is agreeable to proceed. Consent signed and in chart.    Thank you for this interesting  consult.  I greatly enjoyed meeting Katelyn Fisher and look forward to participating in their care.  A copy of this report was sent to the requesting provider on this date.  Electronically Signed: Ascencion Dike, PA-C 04/30/2017, 8:31 AM   I spent a total of 20 minutes in face to face in clinical consultation, greater than 50% of which was counseling/coordinating care for port removal

## 2017-04-30 NOTE — Discharge Instructions (Signed)
Moderate Conscious Sedation, Adult, Care After °These instructions provide you with information about caring for yourself after your procedure. Your health care provider may also give you more specific instructions. Your treatment has been planned according to current medical practices, but problems sometimes occur. Call your health care provider if you have any problems or questions after your procedure. °What can I expect after the procedure? °After your procedure, it is common: °· To feel sleepy for several hours. °· To feel clumsy and have poor balance for several hours. °· To have poor judgment for several hours. °· To vomit if you eat too soon. ° °Follow these instructions at home: °For at least 24 hours after the procedure: ° °· Do not: °? Participate in activities where you could fall or become injured. °? Drive. °? Use heavy machinery. °? Drink alcohol. °? Take sleeping pills or medicines that cause drowsiness. °? Make important decisions or sign legal documents. °? Take care of children on your own. °· Rest. °Eating and drinking °· Follow the diet recommended by your health care provider. °· If you vomit: °? Drink water, juice, or soup when you can drink without vomiting. °? Make sure you have little or no nausea before eating solid foods. °General instructions °· Have a responsible adult stay with you until you are awake and alert. °· Take over-the-counter and prescription medicines only as told by your health care provider. °· If you smoke, do not smoke without supervision. °· Keep all follow-up visits as told by your health care provider. This is important. °Contact a health care provider if: °· You keep feeling nauseous or you keep vomiting. °· You feel light-headed. °· You develop a rash. °· You have a fever. °Get help right away if: °· You have trouble breathing. °This information is not intended to replace advice given to you by your health care provider. Make sure you discuss any questions you have  with your health care provider. °Document Released: 04/29/2013 Document Revised: 12/12/2015 Document Reviewed: 10/29/2015 °Elsevier Interactive Patient Education © 2018 [employer] Inc[employer] ° ° ° ° °Implanted Port Removal, Care After °Refer to this sheet in the next few weeks. These instructions provide you with information about caring for yourself after your procedure. Your health care provider may also give you more specific instructions. Your treatment has been planned according to current medical practices, but problems sometimes occur. Call your health care provider if you have any problems or questions after your procedure. °What can I expect after the procedure? °After the procedure, it is common to have: °· Soreness or pain near your incision. °· Some swelling or bruising near your incision. ° °Follow these instructions at home: °Medicines °· Take over-the-counter and prescription medicines only as told by your health care provider. °· If you were prescribed an antibiotic medicine, take it as told by your health care provider. Do not stop taking the antibiotic even if you start to feel better. °Bathing °· Do not take baths, swim, or use a hot tub until your health care provider approves. Ask your health care provider if you can take showers. You may only be allowed to take sponge baths for bathing. °Incision care °· Follow instructions from your health care provider about how to take care of your incision. Make sure you: °? Wash your hands with soap and water before you change your bandage (dressing). If soap and water are not available, use hand sanitizer. °? Change your dressing as told by your health care provider. °?   Keep your dressing dry. °? Leave stitches (sutures), skin glue, or adhesive strips in place. These skin closures may need to stay in place for 2 weeks or longer. If adhesive strip edges start to loosen and curl up, you may trim the loose edges. Do not remove adhesive strips completely unless your  health care provider tells you to do that. °· Check your incision area every day for signs of infection. Check for: °? More redness, swelling, or pain. °? More fluid or blood. °? Warmth. °? Pus or a bad smell. °Driving °· If you received a sedative, do not drive for 24 hours after the procedure. °· If you did not receive a sedative, ask your health care provider when it is safe to drive. °Activity °· Return to your normal activities as told by your health care provider. Ask your health care provider what activities are safe for you. °· Until your health care provider says it is safe: °? Do not lift anything that is heavier than 10 lb (4.5 kg). °? Do not do activities that involve lifting your arms over your head. °General instructions °· Do not use any tobacco products, such as cigarettes, chewing tobacco, and e-cigarettes. Tobacco can delay healing. If you need help quitting, ask your health care provider. °· Keep all follow-up visits as told by your health care provider. This is important. °Contact a health care provider if: °· You have more redness, swelling, or pain around your incision. °· You have more fluid or blood coming from your incision. °· Your incision feels warm to the touch. °· You have pus or a bad smell coming from your incision. °· You have a fever. °· You have pain that is not relieved by your pain medicine. °Get help right away if: °· You have chest pain. °· You have difficulty breathing. °This information is not intended to replace advice given to you by your health care provider. Make sure you discuss any questions you have with your health care provider. °Document Released: 06/20/2015 Document Revised: 12/15/2015 Document Reviewed: 04/13/2015 °Elsevier Interactive Patient Education © 2018 Elsevier Inc. ° °

## 2017-05-14 ENCOUNTER — Ambulatory Visit: Payer: Medicare PPO | Admitting: Podiatry

## 2017-05-23 ENCOUNTER — Other Ambulatory Visit: Payer: Self-pay | Admitting: Family Medicine

## 2017-05-23 DIAGNOSIS — E669 Obesity, unspecified: Secondary | ICD-10-CM

## 2017-05-23 DIAGNOSIS — E1169 Type 2 diabetes mellitus with other specified complication: Secondary | ICD-10-CM

## 2017-05-23 DIAGNOSIS — I10 Essential (primary) hypertension: Secondary | ICD-10-CM

## 2017-06-19 ENCOUNTER — Encounter: Payer: Self-pay | Admitting: Family Medicine

## 2017-06-19 ENCOUNTER — Ambulatory Visit: Payer: Medicare PPO | Admitting: Family Medicine

## 2017-06-19 DIAGNOSIS — J069 Acute upper respiratory infection, unspecified: Secondary | ICD-10-CM

## 2017-06-19 MED ORDER — HYDROCOD POLST-CPM POLST ER 10-8 MG/5ML PO SUER: 5.0000 mL | Freq: Two times a day (BID) | ORAL | 0 refills | Status: DC | PRN

## 2017-06-19 MED ORDER — BENZONATATE 100 MG PO CAPS: 100.0000 mg | ORAL_CAPSULE | Freq: Three times a day (TID) | ORAL | 0 refills | Status: DC | PRN

## 2017-06-19 NOTE — Progress Notes (Signed)
Chief Complaint  Patient presents with  . Cough    congestion    Azarria A Rotunno here for URI complaints.  Duration: 6 days  Associated symptoms: sinus congestion, rhinorrhea, ear pain and productive cough Denies: itchy watery eyes, ear drainage, sore throat, wheezing, shortness of breath, myalgia and fevers/rigors Treatment to date: Ricola, sinus pill Sick contacts: Yes  ROS:  Const: Denies fevers HEENT: As noted in HPI Lungs: No SOB  Past Medical History:  Diagnosis Date  . Anxiety   . Arthritis    back  . Asthma   . Benign paroxysmal positional vertigo 11/01/2012  . Cancer ([place]) 12/22/2014   endometrial  . Depression   . Endometrial cancer ([place]) 2015  . Family history of breast cancer   . Family history of colon cancer   . Family history of ovarian cancer   . Family history of prostate cancer   . GERD (gastroesophageal reflux disease)   . History of colonic diverticulitis   . Hx of colonic polyps   . Hyperlipidemia   . Hypertension   . 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  . Low back pain radiating to right leg 08/18/2012  . Obesity   . Personal history of chemotherapy 2015  . Radiation 12/17/14-01/03/15   vaginal brachytherapy  . Thyroid disease 06/01/2013     BP 122/82 (BP Location: Left Arm, Patient Position: Sitting, Cuff Size: Large)   Pulse 90   Temp 98.6 F (37 C) (Oral)   Ht 5' 2.5" (1.588 m)   Wt 244 lb (110.7 kg)   SpO2 97%   BMI 43.92 kg/m  General: Awake, alert, appears stated age HEENT: AT, [place], ears patent b/l and TM's neg, nares patent w/o discharge, pharynx pink and without exudates, MMM Neck: No masses or asymmetry Heart: RRR Lungs: CTAB, no accessory muscle use Psych: Age appropriate judgment and insight, normal mood and affect  Acute upper respiratory infection - Plan: chlorpheniramine-HYDROcodone (TUSSIONEX PENNKINETIC ER) 10-8 MG/5ML SUER, benzonatate (TESSALON) 100 MG capsule  Orders as above. Only take syrup at night.  Continue to  push fluids, practice good hand hygiene, cover mouth when coughing. F/u prn. If starting to experience fevers, shaking, or shortness of breath, seek immediate care. Pt voiced understanding and agreement to the plan.  [place], DO 06/19/17 3:39 PM

## 2017-06-19 NOTE — Patient Instructions (Signed)
Continue to push fluids, practice good hand hygiene, and cover your mouth if you cough.  If you start having fevers, shaking or shortness of breath, seek immediate care.  

## 2017-06-19 NOTE — Progress Notes (Signed)
Pre visit review using our clinic review tool, if applicable. No additional management support is needed unless otherwise documented below in the visit note. 

## 2017-07-22 LAB — HM DIABETES EYE EXAM

## 2017-07-25 ENCOUNTER — Ambulatory Visit: Payer: Medicare PPO | Admitting: Family Medicine

## 2017-07-25 ENCOUNTER — Encounter: Payer: Self-pay | Admitting: Family Medicine

## 2017-07-25 ENCOUNTER — Telehealth: Payer: Self-pay | Admitting: *Deleted

## 2017-07-25 ENCOUNTER — Other Ambulatory Visit: Payer: Self-pay | Admitting: Family Medicine

## 2017-07-25 VITALS — BP 118/82 | HR 82 | Temp 98.4°F | Ht 62.0 in | Wt 238.1 lb

## 2017-07-25 DIAGNOSIS — E1169 Type 2 diabetes mellitus with other specified complication: Secondary | ICD-10-CM

## 2017-07-25 DIAGNOSIS — M25561 Pain in right knee: Secondary | ICD-10-CM

## 2017-07-25 DIAGNOSIS — E669 Obesity, unspecified: Secondary | ICD-10-CM

## 2017-07-25 DIAGNOSIS — I10 Essential (primary) hypertension: Secondary | ICD-10-CM

## 2017-07-25 DIAGNOSIS — G8929 Other chronic pain: Secondary | ICD-10-CM

## 2017-07-25 LAB — HEMOGLOBIN A1C: HEMOGLOBIN A1C: 7.5 % — AB (ref 4.6–6.5)

## 2017-07-25 MED ORDER — METFORMIN HCL 500 MG PO TABS: 500.0000 mg | ORAL_TABLET | Freq: Every day | ORAL | 3 refills | Status: DC

## 2017-07-25 NOTE — Progress Notes (Signed)
Pre visit review using our clinic review tool, if applicable. No additional management support is needed unless otherwise documented below in the visit note. 

## 2017-07-25 NOTE — Patient Instructions (Signed)
[employer] protein bars are good supplements to meals. MetRx bars are meal replacements.   Keep up the good work.   Let us know if you need anything.

## 2017-07-25 NOTE — Progress Notes (Signed)
Subjective:   Chief Complaint  Patient presents with  . Follow-up    Katelyn Fisher is a 67 y.o. female here for follow-up of diabetes.   Ara does not routinely require  Patient does not require insulin.   Medications include: Metformin 500 mg/d Patient exercises 4 days per week on average.   She does take an aspirin daily. Statin? Yes ACEi/ARB? Yes  Hypertension Patient presents for hypertension follow up. She does monitor home blood pressures. Blood pressures ranging on average from 120's/80's. She is compliant with medications. Patient has these side effects of medication: none She is adhering to a healthy diet overall. Exercise: walking  R knee pain Chronic R knee pain, starting to bother again. She has received steroid injections in knee before that have been helpful. Going to [place] at end of Feb, would like injection before then. Last one was >6 mo ago.   Past Medical History:  Diagnosis Date  . Anxiety   . Arthritis    back  . Asthma   . Benign paroxysmal positional vertigo 11/01/2012  . Cancer ([place]) 12/22/2014   endometrial  . Depression   . Endometrial cancer ([place]) 2015  . Family history of breast cancer   . Family history of colon cancer   . Family history of ovarian cancer   . Family history of prostate cancer   . GERD (gastroesophageal reflux disease)   . History of colonic diverticulitis   . Hx of colonic polyps   . Hyperlipidemia   . Hypertension   . 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  . Low back pain radiating to right leg 08/18/2012  . Obesity   . Personal history of chemotherapy 2015  . Radiation 12/17/14-01/03/15   vaginal brachytherapy  . Thyroid disease 06/01/2013    Past Surgical History:  Procedure Laterality Date  . ABDOMINAL HYSTERECTOMY  06/23/14   [facility], TRH/BSO  . CHOLECYSTECTOMY  1991  . DILATION AND CURETTAGE OF UTERUS    . IR REMOVAL TUN ACCESS W/ PORT W/O FL MOD SED  04/30/2017  . TUBAL LIGATION      Social History    Socioeconomic History  . Marital status: Married  Occupational History  . Occupation: retired Network engineer  Tobacco Use  . Smoking status: Never Smoker  . Smokeless tobacco: Never Used  Substance and Sexual Activity  . Alcohol use: No    Alcohol/week: 0.0 oz  . Drug use: No  . Sexual activity: Yes    Partners: Male    Comment: lives with husband, no dietary restrictions   G2 P 2  Social History Narrative   Married- 65 years   Never Smoked   Alcohol use-no   Drug use-no   Occupation: housewife   Caffeine use/day:  None   Does Patient Exercise:  yes    Current Outpatient Medications on File Prior to Visit  Medication Sig Dispense Refill  . albuterol (PROVENTIL) (2.5 MG/3ML) 0.083% nebulizer solution Take 3 mLs (2.5 mg total) by nebulization every 4 (four) hours as needed for wheezing or shortness of breath. 150 mL 2  . amLODipine (NORVASC) 10 MG tablet TAKE 1 TABLET EVERY DAY 90 tablet 1  . aspirin 81 MG tablet Take 81 mg by mouth daily.      Marland Kitchen atorvastatin (LIPITOR) 40 MG tablet TAKE 1 TABLET (40 MG TOTAL) BY MOUTH DAILY. 90 tablet 0  . benazepril-hydrochlorthiazide (LOTENSIN HCT) 20-25 MG tablet TAKE 1 TABLET EVERY DAY 90 tablet 1  . benzonatate (TESSALON) 100 MG capsule  Take 1 capsule (100 mg total) by mouth 3 (three) times daily as needed for cough. 30 capsule 0  . budesonide-formoterol (SYMBICORT) 160-4.5 MCG/ACT inhaler TWO PUFFS TWICE A DAY TO PREVENT COUGH OR WHEEZE. RINSE, GARGLE AND SPIT AFTER USE. 1 Inhaler 5  . carvedilol (COREG) 12.5 MG tablet TAKE 1 TABLET (12.5 MG TOTAL) BY MOUTH 2 (TWO) TIMES DAILY WITH A MEAL. 180 tablet 0  . chlorpheniramine-HYDROcodone (TUSSIONEX PENNKINETIC ER) 10-8 MG/5ML SUER Take 5 mLs by mouth every 12 (twelve) hours as needed for cough. 120 mL 0  . diphenhydramine-acetaminophen (TYLENOL PM) 25-500 MG TABS Take 1 tablet by mouth at bedtime as needed. Reported on 01/30/2016    . fluticasone (FLOVENT HFA) 110 MCG/ACT inhaler Inhale 2 puffs into  the lungs daily. 1 Inhaler 1  . furosemide (LASIX) 20 MG tablet TAKE 1 TABLET EVERY DAY 90 tablet 1  . levothyroxine (SYNTHROID, LEVOTHROID) 75 MCG tablet Take 1 tablet (75 mcg total) by mouth daily before breakfast. 90 tablet 1  . metFORMIN (GLUCOPHAGE) 500 MG tablet Take 1 tablet (500 mg total) by mouth daily with breakfast. 90 tablet 1  . Multiple Vitamin (MULTIVITAMIN) tablet Take 1 tablet by mouth daily.    . naproxen (NAPROSYN) 500 MG tablet Take 1 tablet (500 mg total) by mouth 2 (two) times daily with a meal. 20 tablet 0  . pantoprazole (PROTONIX) 40 MG tablet Take 1 tablet (40 mg total) by mouth daily. 90 tablet 2  . Probiotic Product (ALIGN) 4 MG CAPS Take 1 capsule by mouth daily.     Marland Kitchen triamcinolone cream (KENALOG) 0.1 % Apply twice daily as needed to affected areas below face 30 g 3   Related testing: Foot exam(monofilament and inspection):done Date of retinal exam: 07/22/17  Done by:  Dr. Lillia Mountain Pneumovax: done Flu Shot: done  Review of Systems: Eye:  No recent significant change in vision Pulmonary:  No SOB Cardiovascular:  No chest pain, no palpitations Skin/Integumentary ROS:  No abnormal skin lesions reported Neurologic:  No numbness, tingling  Objective:  BP 118/82 (BP Location: Left Arm, Patient Position: Sitting, Cuff Size: Large)   Pulse 82   Temp 98.4 F (36.9 C) (Oral)   Ht 5\' 2"  (1.575 m)   Wt 238 lb 2 oz (108 kg)   SpO2 99%   BMI 43.55 kg/m  General:  Well developed, well nourished, in no apparent distress Skin:  Warm, no pallor or diaphoresis Head:  Normocephalic, atraumatic Eyes:  Pupils equal and round, sclera anicteric without injection  Nose:  External nares without trauma, no discharge Throat/Pharynx:  Lips and gingiva without lesion Neck: Neck supple.  No obvious thyromegaly or masses.  No bruits Lungs:  clear to auscultation, breath sounds equal bilaterally, no wheezes, rales, or stridor Cardio:  regular rate and rhythm, no bruits, no LE  edema Musculoskeletal:  Symmetrical muscle groups noted without atrophy or deformity Neuro:  Sensation intact to pinprick on feet Psych: Age appropriate judgment and insight  Assessment:   Diabetes mellitus type 2 in obese ([place]) - Plan: Hemoglobin A1c, HM Diabetes Foot Exam  Essential hypertension  Chronic pain of right knee   Plan:   Orders as above. Cont current regimen. Doing well.  F/u in 6 weeks for welcome to medicare exam and knee injection. The patient voiced understanding and agreement to the plan.  [place], DO 07/25/17 7:20 AM

## 2017-07-25 NOTE — Telephone Encounter (Signed)
Patient called and scheduled appt to see Dr. Skeet Latch in May. Patient to see Dr. Dellis Filbert in February.

## 2017-08-05 ENCOUNTER — Ambulatory Visit (INDEPENDENT_AMBULATORY_CARE_PROVIDER_SITE_OTHER): Payer: Medicare PPO | Admitting: Pediatrics

## 2017-08-05 ENCOUNTER — Encounter: Payer: Self-pay | Admitting: Pediatrics

## 2017-08-05 VITALS — BP 140/80 | HR 95 | Temp 97.7°F | Resp 20

## 2017-08-05 DIAGNOSIS — J4520 Mild intermittent asthma, uncomplicated: Secondary | ICD-10-CM

## 2017-08-05 DIAGNOSIS — T7800XD Anaphylactic reaction due to unspecified food, subsequent encounter: Secondary | ICD-10-CM

## 2017-08-05 DIAGNOSIS — C541 Malignant neoplasm of endometrium: Secondary | ICD-10-CM

## 2017-08-05 DIAGNOSIS — I10 Essential (primary) hypertension: Secondary | ICD-10-CM | POA: Diagnosis not present

## 2017-08-05 DIAGNOSIS — T7800XA Anaphylactic reaction due to unspecified food, initial encounter: Secondary | ICD-10-CM

## 2017-08-05 HISTORY — DX: Anaphylactic reaction due to unspecified food, initial encounter: T78.00XA

## 2017-08-05 MED ORDER — ALBUTEROL SULFATE HFA 108 (90 BASE) MCG/ACT IN AERS: INHALATION_SPRAY | RESPIRATORY_TRACT | 0 refills | Status: DC

## 2017-08-05 NOTE — Patient Instructions (Addendum)
Pro-air 2 puffs every 4 hours if needed for wheezing or coughing spells If her asthma is not well controlled she will start on Symbicort 160-2 puffs every 12 hours Fluticasone 2 sprays per nostril once a day if needed for stuffy nose Continue avoiding shellfish. If she has an allergic reaction take Benadryl 50 mg every 4 hours and if she has life-threatening symptoms she will inject with EpiPen 0.3 mg Continue on her other medications Call us if she is not doing well on this treatment plan

## 2017-08-05 NOTE — Progress Notes (Signed)
  [place] [address] Dept: [phone]  FOLLOW UP NOTE  Patient ID: Katelyn Fisher, female    DOB: 21-Oct-1950  Age: 67 y.o. MRN: [id number] Date of Office Visit: 08/05/2017  Assessment  Chief Complaint: Asthma (doing well)  HPI Katelyn Fisher presents for follow-up of asthma and allergic rhinitis. She continues to avoid shellfish. Her endometrial cancer is well controlled. She is only using Pro-air 2 puffs every 4 hours if needed and very rarely has to use it. She is not taking any medications for allergies or asthma on a daily basis, but she continues to take pantoprazole 40 mg once a day  Other medications are outlined in the chart   Drug Allergies:  Allergies  Allergen Reactions  . Oxycodone Swelling    Facial swelling and tightness  . Penicillins Swelling    Rash with welps  . Shellfish Allergy Hives, Shortness Of Breath and Swelling  . Iodine     Rash  . Sulfa Antibiotics Rash    Physical Exam: BP 140/80   Pulse 95   Temp 97.7 F (36.5 C) (Oral)   Resp 20   SpO2 97%    Physical Exam  Constitutional: She is oriented to person, place, and time. She appears well-developed and well-nourished.  HENT:  Eyes normal. Ears normal. Nose normal. Pharynx normal.  Neck: Neck supple.  Cardiovascular:  S1 and S2 normal no murmurs  Pulmonary/Chest:  Clear to percussion and auscultation  Lymphadenopathy:    She has no cervical adenopathy.  Neurological: She is alert and oriented to person, place, and time.  Psychiatric: She has a normal mood and affect. Her behavior is normal. Judgment and thought content normal.  Vitals reviewed.   Diagnostics:  FVC 2.10 L FEV1 1.67 L. Predicted FVC 2.28 L predicted FEV1 1.77 L-the spirometry is in the normal range  Assessment and Plan: 1. Mild intermittent asthma without complication   2. Anaphylactic shock due to food, subsequent encounter   3. Endometrial cancer ([place])   4. Essential hypertension      Meds ordered this encounter  Medications  . albuterol (PROAIR HFA) 108 (90 Base) MCG/ACT inhaler    Sig: 2 puffs every 4-6 hours as needed for coughing or wheezing spells    Dispense:  3 Inhaler    Refill:  0    Patient Instructions  Pro-air 2 puffs every 4 hours if needed for wheezing or coughing spells If her asthma is not well controlled she will start on Symbicort 160-2 puffs every 12 hours Fluticasone 2 sprays per nostril once a day if needed for stuffy nose Continue avoiding shellfish. If she has an allergic reaction take Benadryl 50 mg every 4 hours and if she has life-threatening symptoms she will inject with EpiPen 0.3 mg Continue on her other medications Call us if she is not doing well on this treatment plan   Return in about 6 months (around 02/02/2018).    Thank you for the opportunity to care for this patient.  Please do not hesitate to contact me with questions.  Penne Lash, M.D.  [facility] [address] [place], [place] [address] [phone]

## 2017-08-06 ENCOUNTER — Other Ambulatory Visit: Payer: Self-pay | Admitting: Family Medicine

## 2017-08-06 DIAGNOSIS — E669 Obesity, unspecified: Principal | ICD-10-CM

## 2017-08-06 DIAGNOSIS — E1169 Type 2 diabetes mellitus with other specified complication: Secondary | ICD-10-CM

## 2017-09-03 NOTE — Progress Notes (Signed)
Subjective:   Katelyn Fisher is a 67 y.o. female who presents for an Initial Medicare Annual Wellness Visit.  Review of Systems   No ROS.  Medicare Wellness Visit. Additional risk factors are reflected in the social history. Cardiac Risk Factors include: advanced age (>36men, >31 women);dyslipidemia;hypertension;obesity (BMI >30kg/m2);sedentary lifestyle;diabetes mellitus Sleep patterns: Sleeps well 8 hrs. Home Safety/Smoke Alarms: Feels safe in home. Smoke alarms in place.  Living environment; residence and Firearm Safety: 1 story home. Lives with husband. Seat Belt Safety/Bike Helmet: Wears seat belt.   Female:   Pap- Hysterectomy.     Mammo- last 11/12/16: BI-RADS CATEGORY  1: Negative.      Dexa scan-  Pt states it is scheduled for March      CCS- last 10/24/16:  Recall 5 yrs    Objective:    Today's Vitals   09/09/17 0809  BP: 120/84  Pulse: 80  SpO2: 98%  Weight: 236 lb (107 kg)   Body mass index is 43.16 kg/m.  Advanced Directives 09/09/2017 04/30/2017 11/29/2016 10/08/2016 09/26/2016 03/12/2016 01/30/2016  Does Patient Have a Medical Advance Directive? Yes Yes Yes Yes Yes Yes Yes  Type of Paramedic of [place];Living will Living will Living will;Healthcare Power of [place];Living will [place];Living will [place];Living will [place];Living will  Does patient want to make changes to medical advance directive? - Yes (MAU/Ambulatory/Procedural Areas - Information given) - - - - No - Patient declined  Copy of [place] in Chart? No - copy requested - No - copy requested No - copy requested Yes Yes Yes    Current Medications (verified) Outpatient Encounter Medications as of 09/09/2017  Medication Sig  . albuterol (PROAIR HFA) 108 (90 Base) MCG/ACT inhaler 2 puffs every 4-6 hours as needed for coughing or wheezing spells  . albuterol (PROVENTIL)  (2.5 MG/3ML) 0.083% nebulizer solution Take 3 mLs (2.5 mg total) by nebulization every 4 (four) hours as needed for wheezing or shortness of breath.  . ALBUTEROL IN Inhale 2 puffs into the lungs every 4 (four) hours as needed.  Marland Kitchen amLODipine (NORVASC) 10 MG tablet TAKE 1 TABLET EVERY DAY  . aspirin 81 MG tablet Take 81 mg by mouth daily.    Marland Kitchen atorvastatin (LIPITOR) 40 MG tablet TAKE 1 TABLET (40 MG TOTAL) BY MOUTH DAILY.  . benazepril-hydrochlorthiazide (LOTENSIN HCT) 20-25 MG tablet TAKE 1 TABLET EVERY DAY  . benzonatate (TESSALON) 100 MG capsule Take 1 capsule (100 mg total) by mouth 3 (three) times daily as needed for cough.  . budesonide-formoterol (SYMBICORT) 160-4.5 MCG/ACT inhaler TWO PUFFS TWICE A DAY TO PREVENT COUGH OR WHEEZE. RINSE, GARGLE AND SPIT AFTER USE. (Patient taking differently: Inhale 2 puffs into the lungs 2 (two) times daily as needed. TWO PUFFS TWICE A DAY TO PREVENT COUGH OR WHEEZE. RINSE, GARGLE AND SPIT AFTER USE.)  . carvedilol (COREG) 12.5 MG tablet TAKE 1 TABLET TWICE DAILY WITH MEALS  . diphenhydramine-acetaminophen (TYLENOL PM) 25-500 MG TABS Take 1 tablet by mouth at bedtime as needed. Reported on 01/30/2016  . EPINEPHrine (EPIPEN 2-PAK) 0.3 mg/0.3 mL IJ SOAJ injection Inject 0.3 mg into the muscle once. Use as directed for severe allergic reactions  . furosemide (LASIX) 20 MG tablet TAKE 1 TABLET EVERY DAY  . levothyroxine (SYNTHROID, LEVOTHROID) 75 MCG tablet Take 1 tablet (75 mcg total) by mouth daily before breakfast.  . metFORMIN (GLUCOPHAGE) 500 MG tablet Take  1 tablet (500 mg total) by mouth daily with breakfast.  . Multiple Vitamin (MULTIVITAMIN) tablet Take 1 tablet by mouth daily.  . naproxen (NAPROSYN) 500 MG tablet Take 1 tablet (500 mg total) by mouth 2 (two) times daily with a meal. (Patient taking differently: Take 500 mg by mouth 2 (two) times daily as needed. )  . pantoprazole (PROTONIX) 40 MG tablet Take 1 tablet (40 mg total) by mouth daily.  .  Probiotic Product (ALIGN) 4 MG CAPS Take 1 capsule by mouth daily.   Marland Kitchen triamcinolone cream (KENALOG) 0.1 % Apply twice daily as needed to affected areas below face   Facility-Administered Encounter Medications as of 09/09/2017  Medication  . 0.9 %  sodium chloride infusion  . alteplase (CATHFLO ACTIVASE) injection 2 mg  . heparin lock flush 100 unit/mL  . sodium chloride flush (NS) 0.9 % injection 10 mL    Allergies (verified) Oxycodone; Penicillins; Shellfish allergy; Iodine; and Sulfa antibiotics   History: Past Medical History:  Diagnosis Date  . Anxiety   . Arthritis    back  . Asthma   . Benign paroxysmal positional vertigo 11/01/2012  . Cancer ([place]) 12/22/2014   endometrial  . Depression   . Endometrial cancer ([place]) 2015  . Family history of breast cancer   . Family history of colon cancer   . Family history of ovarian cancer   . Family history of prostate cancer   . GERD (gastroesophageal reflux disease)   . History of colonic diverticulitis   . Hx of colonic polyps   . Hyperlipidemia   . Hypertension   . 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  . Low back pain radiating to right leg 08/18/2012  . Obesity   . Personal history of chemotherapy 2015  . Radiation 12/17/14-01/03/15   vaginal brachytherapy  . Thyroid disease 06/01/2013   Past Surgical History:  Procedure Laterality Date  . ABDOMINAL HYSTERECTOMY  06/23/14   [facility], TRH/BSO  . CHOLECYSTECTOMY  1991  . DILATION AND CURETTAGE OF UTERUS    . IR REMOVAL TUN ACCESS W/ PORT W/O FL MOD SED  04/30/2017  . TUBAL LIGATION     Family History  Problem Relation Age of Onset  . Heart disease Maternal Aunt        x 3 -2 brothers  . Hypertension Maternal Aunt   . Breast cancer Maternal Aunt        maternal half; dx in her 70s  . Cancer Maternal Aunt        cervical  . Ovarian cancer Maternal Aunt        dx in her 24s  . Prostate cancer Father 68  . Diabetes Father   . Cancer Father        lung cancer, asbestos  exposure  . Heart disease Brother   . Diabetes Brother   . Diabetes Mother   . Lupus Mother   . Heart disease Brother   . Hyperlipidemia Sister   . Heart disease Brother   . Diabetes Brother   . Colon polyps Brother   . Heart disease Brother   . Diabetes Brother   . Colon polyps Brother   . Colon cancer Maternal Grandmother        dx in her 52s  . Leukemia Maternal Uncle 21  . Cancer Paternal Aunt        NOS- breast   . Prostate cancer Paternal Uncle        dx in his 41s  . Breast  cancer Other        dx in her 57s   Social History   Socioeconomic History  . Marital status: Married    Spouse name: None  . Number of children: 3  . Years of education: None  . Highest education level: None  Social Needs  . Financial resource strain: None  . Food insecurity - worry: None  . Food insecurity - inability: None  . Transportation needs - medical: None  . Transportation needs - non-medical: None  Occupational History  . Occupation: retired Network engineer  Tobacco Use  . Smoking status: Never Smoker  . Smokeless tobacco: Never Used  Substance and Sexual Activity  . Alcohol use: No    Alcohol/week: 0.0 oz  . Drug use: No  . Sexual activity: Yes    Partners: Male    Comment: 1st intercourse- 57, partners- 7, married- 55 yrs   Other Topics Concern  . None  Social History Narrative   Married- 85 years   Never Smoked   Alcohol use-no   Drug use-no   Occupation: housewife   Caffeine use/day:  None   Does Patient Exercise:  yes    Tobacco Counseling Counseling given: Not Answered   Clinical Intake:     Pain : No/denies pain   Activities of Daily Living In your present state of health, do you have any difficulty performing the following activities: 09/09/2017 04/30/2017  Hearing? N N  Vision? N N  Comment Wearing glasses. Eye doctor yearly. -  Difficulty concentrating or making decisions? N N  Walking or climbing stairs? N Y  Dressing or bathing? N N  Doing  errands, shopping? N -  Preparing Food and eating ? N -  Using the Toilet? N -  In the past six months, have you accidently leaked urine? N -  Do you have problems with loss of bowel control? N -  Managing your Medications? N -  Managing your Finances? N -  Housekeeping or managing your Housekeeping? N -  Some recent data might be hidden     Immunizations and Health Maintenance Immunization History  Administered Date(s) Administered  . Influenza Split 06/11/2011, 05/21/2012  . Influenza Whole 06/24/2007, 04/16/2008, 05/02/2010  . Influenza,inj,Quad PF,6+ Mos 06/01/2013, 05/19/2015, 03/30/2016  . Influenza-Unspecified 05/23/2014, 05/21/2017  . Pneumococcal Polysaccharide-23 09/24/2016  . Td 09/26/2007   There are no preventive care reminders to display for this patient.  Patient Care Team: Shelda Pal, DO as PCP - General (Family Medicine) Suella Broad, MD as Consulting Physician (Physical Medicine and Rehabilitation) Gordy Levan, MD as Consulting Physician (Oncology) Janie Morning, MD as Attending Physician (Obstetrics and Gynecology) Eppie Gibson, MD as Attending Physician (Radiation Oncology)  Indicate any recent Medical Services you may have received from other than [facility] providers in the past year (date may be approximate).     Assessment:   This is a routine wellness examination for Janyth. Physical assessment deferred to PCP.  Hearing/Vision screen Hearing Screening Comments: Able to hear conversational tones w/o difficulty. No issues reported.   Vision Screening Comments: Pt reports yearly eye exam.   Dietary issues and exercise activities discussed: Current Exercise Habits: The patient does not participate in regular exercise at present, Exercise limited by: None identified   Diet (meal preparation, eat out, water intake, caffeinated beverages, dairy products, fruits and vegetables): in general, a "healthy" diet  , well balanced    Goals     . Increase physical activity  Continue chair exercises and maybe implement Walk Away the Pounds [name]     . maintain health (pt-stated)    . Reduce portion size      Depression Screen PHQ 2/9 Scores 09/09/2017 09/26/2016 03/12/2016 09/05/2015 05/17/2015 02/24/2015 02/01/2015  PHQ - 2 Score 0 0 0 0 0 0 0    Fall Risk Fall Risk  09/09/2017 09/26/2016 03/12/2016 09/05/2015 05/17/2015  Falls in the past year? No No No No No  Number falls in past yr: - - - - -  Injury with Fall? - - - - -  Risk for fall due to : - - - - -  Risk for fall due to: Comment - - - - -  Follow up - - - - -   Cognitive Function: MMSE - Mini Mental State Exam 09/09/2017  Orientation to time 5  Orientation to Place 5  Registration 3  Attention/ Calculation 5  Recall 3  Language- name 2 objects 2  Language- repeat 1  Language- follow 3 step command 3  Language- read & follow direction 1  Write a sentence 1  Copy design 1  Total score 30        Screening Tests Health Maintenance  Topic Date Due  . PNA vac Low Risk Adult (1 of 2 - PCV13) 09/24/2017  . TETANUS/TDAP  09/25/2017  . HEMOGLOBIN A1C  01/22/2018  . OPHTHALMOLOGY EXAM  07/22/2018  . FOOT EXAM  07/25/2018  . PAP SMEAR  09/04/2018  . MAMMOGRAM  11/13/2018  . COLONOSCOPY  10/24/2021  . INFLUENZA VACCINE  Completed  . DEXA SCAN  Completed  . Hepatitis C Screening  Completed  . HIV Screening  Completed      Plan:   Follow up with PCP as directed  Continue to eat heart healthy diet (full of fruits, vegetables, whole grains, lean protein, water--limit salt, fat, and sugar intake) and increase physical activity as tolerated.  Continue doing brain stimulating activities (puzzles, reading, adult coloring books, staying active) to keep memory sharp.   Bring a copy of your living will and/or healthcare power of attorney to your next office visit.    I have personally reviewed and noted the following in the patient's chart:    . Medical and social history . Use of alcohol, tobacco or illicit drugs  . Current medications and supplements . Functional ability and status . Nutritional status . Physical activity . Advanced directives . List of other physicians . Hospitalizations, surgeries, and ER visits in previous 12 months . Vitals . Screenings to include cognitive, depression, and falls . Referrals and appointments  In addition, I have reviewed and discussed with patient certain preventive protocols, quality metrics, and best practice recommendations. A written personalized care plan for preventive services as well as general preventive health recommendations were provided to patient.     Naaman Plummer [place], [place]   09/09/2017

## 2017-09-04 ENCOUNTER — Encounter: Payer: Medicare PPO | Admitting: Obstetrics & Gynecology

## 2017-09-05 ENCOUNTER — Encounter: Payer: Self-pay | Admitting: Obstetrics & Gynecology

## 2017-09-05 ENCOUNTER — Ambulatory Visit (INDEPENDENT_AMBULATORY_CARE_PROVIDER_SITE_OTHER): Payer: Medicare PPO | Admitting: Obstetrics & Gynecology

## 2017-09-05 VITALS — BP 126/78 | Ht 61.25 in | Wt 236.0 lb

## 2017-09-05 DIAGNOSIS — Z780 Asymptomatic menopausal state: Secondary | ICD-10-CM

## 2017-09-05 DIAGNOSIS — Z9189 Other specified personal risk factors, not elsewhere classified: Secondary | ICD-10-CM | POA: Diagnosis not present

## 2017-09-05 DIAGNOSIS — C541 Malignant neoplasm of endometrium: Secondary | ICD-10-CM

## 2017-09-05 DIAGNOSIS — Z90722 Acquired absence of ovaries, bilateral: Secondary | ICD-10-CM

## 2017-09-05 DIAGNOSIS — Z90710 Acquired absence of both cervix and uterus: Secondary | ICD-10-CM

## 2017-09-05 DIAGNOSIS — Z1272 Encounter for screening for malignant neoplasm of vagina: Secondary | ICD-10-CM | POA: Diagnosis not present

## 2017-09-05 DIAGNOSIS — Z13820 Encounter for screening for osteoporosis: Secondary | ICD-10-CM

## 2017-09-05 DIAGNOSIS — Z6841 Body Mass Index (BMI) 40.0 and over, adult: Secondary | ICD-10-CM

## 2017-09-05 DIAGNOSIS — Z01411 Encounter for gynecological examination (general) (routine) with abnormal findings: Secondary | ICD-10-CM

## 2017-09-05 DIAGNOSIS — Z9079 Acquired absence of other genital organ(s): Secondary | ICD-10-CM

## 2017-09-05 NOTE — Patient Instructions (Signed)
1. Encounter for gynecological examination with abnormal finding Gynecologic exam status post total hysterectomy with BSO.  Pap reflex done on the vaginal vault.  Breast exam normal.  Will schedule screening mammogram April 2019.  Colonoscopy 2018.  Health labs with family physician.  2. Menopause present Well without hormone replacement therapy.  3. History of total hysterectomy with bilateral salpingo-oophorectomy (BSO)   4. 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  Followed by [facility] and myself with Pap tests every 6 months.  5. Class 3 severe obesity due to excess calories with serious comorbidity and body mass index (BMI) of 40.0 to 44.9 in adult [facility]) Recommend continued aerobic and weightlifting physical activity regularly.  Low calorie/low carb diet, for example [employer] recommended.  6. Screening for osteoporosis Vitamin D supplements, calcium rich nutrition and regular weightbearing physical activity recommended.  - DG Bone Density; Future  Katelyn Fisher, as always it was a pleasure seeing you today!  I will inform you of your results as soon as they are available.   Health Maintenance for Postmenopausal Women Menopause is a normal process in which your reproductive ability comes to an end. This process happens gradually over a span of months to years, usually between the ages of 47 and 3. Menopause is complete when you have missed 12 consecutive menstrual periods. It is important to talk with your health care provider about some of the most common conditions that affect postmenopausal women, such as heart disease, cancer, and bone loss (osteoporosis). Adopting a healthy lifestyle and getting preventive care can help to promote your health and wellness. Those actions can also lower your chances of developing some of these common conditions. What should I know about menopause? During menopause, you may  experience a number of symptoms, such as:  Moderate-to-severe hot flashes.  Night sweats.  Decrease in sex drive.  Mood swings.  Headaches.  Tiredness.  Irritability.  Memory problems.  Insomnia.  Choosing to treat or not to treat menopausal changes is an individual decision that you make with your health care provider. What should I know about hormone replacement therapy and supplements? Hormone therapy products are effective for treating symptoms that are associated with menopause, such as hot flashes and night sweats. Hormone replacement carries certain risks, especially as you become older. If you are thinking about using estrogen or estrogen with progestin treatments, discuss the benefits and risks with your health care provider. What should I know about heart disease and stroke? Heart disease, heart attack, and stroke become more likely as you age. This may be due, in part, to the hormonal changes that your body experiences during menopause. These can affect how your body processes dietary fats, triglycerides, and cholesterol. Heart attack and stroke are both medical emergencies. There are many things that you can do to help prevent heart disease and stroke:  Have your blood pressure checked at least every 1-2 years. High blood pressure causes heart disease and increases the risk of stroke.  If you are 83-27 years old, ask your health care provider if you should take aspirin to prevent a heart attack or a stroke.  Do not use any tobacco products, including cigarettes, chewing tobacco, or electronic cigarettes. If you need help quitting, ask your health care provider.  It is important to eat a healthy diet and maintain a healthy weight. ? Be sure to include plenty of vegetables, fruits, low-fat dairy products, and lean protein. ? Avoid eating  foods that are high in solid fats, added sugars, or salt (sodium).  Get regular exercise. This is one of the most important things  that you can do for your health. ? Try to exercise for at least 150 minutes each week. The type of exercise that you do should increase your heart rate and make you sweat. This is known as moderate-intensity exercise. ? Try to do strengthening exercises at least twice each week. Do these in addition to the moderate-intensity exercise.  Know your numbers.Ask your health care provider to check your cholesterol and your blood glucose. Continue to have your blood tested as directed by your health care provider.  What should I know about cancer screening? There are several types of cancer. Take the following steps to reduce your risk and to catch any cancer development as early as possible. Breast Cancer  Practice breast self-awareness. ? This means understanding how your breasts normally appear and feel. ? It also means doing regular breast self-exams. Let your health care provider know about any changes, no matter how small.  If you are 37 or older, have a clinician do a breast exam (clinical breast exam or CBE) every year. Depending on your age, family history, and medical history, it may be recommended that you also have a yearly breast X-ray (mammogram).  If you have a family history of breast cancer, talk with your health care provider about genetic screening.  If you are at high risk for breast cancer, talk with your health care provider about having an MRI and a mammogram every year.  Breast cancer (BRCA) gene test is recommended for women who have family members with BRCA-related cancers. Results of the assessment will determine the need for genetic counseling and BRCA1 and for BRCA2 testing. BRCA-related cancers include these types: ? Breast. This occurs in males or females. ? Ovarian. ? Tubal. This may also be called fallopian tube cancer. ? Cancer of the abdominal or pelvic lining (peritoneal cancer). ? Prostate. ? Pancreatic.  Cervical, Uterine, and Ovarian Cancer Your health  care provider may recommend that you be screened regularly for cancer of the pelvic organs. These include your ovaries, uterus, and vagina. This screening involves a pelvic exam, which includes checking for microscopic changes to the surface of your cervix (Pap test).  For women ages 21-65, health care providers may recommend a pelvic exam and a Pap test every three years. For women ages 68-65, they may recommend the Pap test and pelvic exam, combined with testing for human papilloma virus (HPV), every five years. Some types of HPV increase your risk of cervical cancer. Testing for HPV may also be done on women of any age who have unclear Pap test results.  Other health care providers may not recommend any screening for nonpregnant women who are considered low risk for pelvic cancer and have no symptoms. Ask your health care provider if a screening pelvic exam is right for you.  If you have had past treatment for cervical cancer or a condition that could lead to cancer, you need Pap tests and screening for cancer for at least 20 years after your treatment. If Pap tests have been discontinued for you, your risk factors (such as having a new sexual partner) need to be reassessed to determine if you should start having screenings again. Some women have medical problems that increase the chance of getting cervical cancer. In these cases, your health care provider may recommend that you have screening and Pap tests more  often.  If you have a family history of uterine cancer or ovarian cancer, talk with your health care provider about genetic screening.  If you have vaginal bleeding after reaching menopause, tell your health care provider.  There are currently no reliable tests available to screen for ovarian cancer.  Lung Cancer Lung cancer screening is recommended for adults 73-58 years old who are at high risk for lung cancer because of a history of smoking. A yearly low-dose CT scan of the lungs is  recommended if you:  Currently smoke.  Have a history of at least 30 pack-years of smoking and you currently smoke or have quit within the past 15 years. A pack-year is smoking an average of one pack of cigarettes per day for one year.  Yearly screening should:  Continue until it has been 15 years since you quit.  Stop if you develop a health problem that would prevent you from having lung cancer treatment.  Colorectal Cancer  This type of cancer can be detected and can often be prevented.  Routine colorectal cancer screening usually begins at age 42 and continues through age 63.  If you have risk factors for colon cancer, your health care provider may recommend that you be screened at an earlier age.  If you have a family history of colorectal cancer, talk with your health care provider about genetic screening.  Your health care provider may also recommend using home test kits to check for hidden blood in your stool.  A small camera at the end of a tube can be used to examine your colon directly (sigmoidoscopy or colonoscopy). This is done to check for the earliest forms of colorectal cancer.  Direct examination of the colon should be repeated every 5-10 years until age 41. However, if early forms of precancerous polyps or small growths are found or if you have a family history or genetic risk for colorectal cancer, you may need to be screened more often.  Skin Cancer  Check your skin from head to toe regularly.  Monitor any moles. Be sure to tell your health care provider: ? About any new moles or changes in moles, especially if there is a change in a mole's shape or color. ? If you have a mole that is larger than the size of a pencil eraser.  If any of your family members has a history of skin cancer, especially at a young age, talk with your health care provider about genetic screening.  Always use sunscreen. Apply sunscreen liberally and repeatedly throughout the  day.  Whenever you are outside, protect yourself by wearing long sleeves, pants, a wide-brimmed hat, and sunglasses.  What should I know about osteoporosis? Osteoporosis is a condition in which bone destruction happens more quickly than new bone creation. After menopause, you may be at an increased risk for osteoporosis. To help prevent osteoporosis or the bone fractures that can happen because of osteoporosis, the following is recommended:  If you are 25-20 years old, get at least 1,000 mg of calcium and at least 600 mg of vitamin D per day.  If you are older than age 67 but younger than age 81, get at least 1,200 mg of calcium and at least 600 mg of vitamin D per day.  If you are older than age 22, get at least 1,200 mg of calcium and at least 800 mg of vitamin D per day.  Smoking and excessive alcohol intake increase the risk of osteoporosis. Eat foods that  are rich in calcium and vitamin D, and do weight-bearing exercises several times each week as directed by your health care provider. What should I know about how menopause affects my mental health? Depression may occur at any age, but it is more common as you become older. Common symptoms of depression include:  Low or sad mood.  Changes in sleep patterns.  Changes in appetite or eating patterns.  Feeling an overall lack of motivation or enjoyment of activities that you previously enjoyed.  Frequent crying spells.  Talk with your health care provider if you think that you are experiencing depression. What should I know about immunizations? It is important that you get and maintain your immunizations. These include:  Tetanus, diphtheria, and pertussis (Tdap) booster vaccine.  Influenza every year before the flu season begins.  Pneumonia vaccine.  Shingles vaccine.  Your health care provider may also recommend other immunizations. This information is not intended to replace advice given to you by your health care provider.  Make sure you discuss any questions you have with your health care provider. Document Released: 08/31/2005 Document Revised: 01/27/2016 Document Reviewed: 04/12/2015 Elsevier Interactive Patient Education  2018 [employer].

## 2017-09-05 NOTE — Addendum Note (Signed)
Addended by: Thurnell Garbe A on: 09/05/2017 12:04 PM   Modules accepted: Orders

## 2017-09-05 NOTE — Progress Notes (Signed)
Katelyn Fisher 08-03-1950 [id number]   History:    67 y.o. G2P2L3 Married.  1 adopted child.  Travels with her husband and the [employer]  RP:  Established patient presenting for annual gyn exam   HPI: Menopause, well on no HRT.  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  Had a total hysterectomy with bilateral salpingo-oophorectomy and staging and postop chemotherapy and radiation treatment which finished in 2016.  Alternating between gynecologic oncology and myself for Pap tests every 6 months.  No pelvic pain.  Breasts normal.  Last mammogram negative April 2018 at the [facility].  Health labs with her family physician.  Continues to go to the [facility] regularly for fitness.  Past medical history,surgical history, family history and social history were all reviewed and documented in the EPIC chart.  Gynecologic History No LMP recorded. Patient has had a hysterectomy. Contraception: status post hysterectomy Last Pap: 08/2016. Results were: Negative (at [facility]) Last mammogram: 10/2016. Results were: Negative Bone Density: 2013 Colonoscopy: 2018  Obstetric History OB History  Gravida Para Term Preterm AB Living  2 2       3   SAB TAB Ectopic Multiple Live Births               # Outcome Date GA Lbr Len/2nd Weight Sex Delivery Anes PTL Lv  2 Para           1 Para             Obstetric Comments  1 adopted child     ROS: A ROS was performed and pertinent positives and negatives are included in the history.  GENERAL: No fevers or chills. HEENT: No change in vision, no earache, sore throat or sinus congestion. NECK: No pain or stiffness. CARDIOVASCULAR: No chest pain or pressure. No palpitations. PULMONARY: No shortness of breath, cough or wheeze. GASTROINTESTINAL: No abdominal pain, nausea, vomiting or diarrhea, melena or bright red blood per rectum. GENITOURINARY: No urinary frequency, urgency, hesitancy or dysuria.  MUSCULOSKELETAL: No joint or muscle pain, no back pain, no recent trauma. DERMATOLOGIC: No rash, no itching, no lesions. ENDOCRINE: No polyuria, polydipsia, no heat or cold intolerance. No recent change in weight. HEMATOLOGICAL: No anemia or easy bruising or bleeding. NEUROLOGIC: No headache, seizures, numbness, tingling or weakness. PSYCHIATRIC: No depression, no loss of interest in normal activity or change in sleep pattern.     Exam:   BP 126/78   Ht 5' 1.25" (1.556 m)   Wt 236 lb (107 kg)   BMI 44.23 kg/m   Body mass index is 44.23 kg/m.  General appearance : Well developed well nourished female. No acute distress HEENT: Eyes: no retinal hemorrhage or exudates,  Neck supple, trachea midline, no carotid bruits, no thyroidmegaly Lungs: Clear to auscultation, no rhonchi or wheezes, or rib retractions  Heart: Regular rate and rhythm, no murmurs or gallops Breast:Examined in sitting and supine position were symmetrical in appearance, no palpable masses or tenderness,  no skin retraction, no nipple inversion, no nipple discharge, no skin discoloration, no axillary or supraclavicular lymphadenopathy Abdomen: no palpable masses or tenderness, no rebound or guarding Extremities: no edema or skin discoloration or tenderness  Pelvic: Vulva: Normal             Vagina: No gross lesions or discharge.  Pap reflex done.  Cervix/Uterus absent  Adnexa  Without masses or tenderness  Anus: Normal   Assessment/Plan:  67 y.o. female for annual exam  1. Encounter for gynecological examination with abnormal finding Gynecologic exam status post total hysterectomy with BSO.  Pap reflex done on the vaginal vault.  Breast exam normal.  Will schedule screening mammogram April 2019.  Colonoscopy 2018.  Health labs with family physician.  2. Menopause present Well without hormone replacement therapy.  3. History of total hysterectomy with bilateral salpingo-oophorectomy (BSO)   4. 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  Followed by [facility] and myself with Pap tests every 6 months.  5. Class 3 severe obesity due to excess calories with serious comorbidity and body mass index (BMI) of 40.0 to 44.9 in adult [facility]) Recommend continued aerobic and weightlifting physical activity regularly.  Low calorie/low carb diet, for example [employer] recommended.  6. Screening for osteoporosis Vitamin D supplements, calcium rich nutrition and regular weightbearing physical activity recommended.  - DG Bone Density; Future  Princess Bruins MD, 9:55 AM 09/05/2017

## 2017-09-09 ENCOUNTER — Encounter: Payer: Self-pay | Admitting: Family Medicine

## 2017-09-09 ENCOUNTER — Encounter: Payer: Self-pay | Admitting: *Deleted

## 2017-09-09 ENCOUNTER — Ambulatory Visit: Payer: Medicare PPO | Admitting: *Deleted

## 2017-09-09 ENCOUNTER — Ambulatory Visit (INDEPENDENT_AMBULATORY_CARE_PROVIDER_SITE_OTHER): Payer: Medicare PPO | Admitting: Family Medicine

## 2017-09-09 ENCOUNTER — Ambulatory Visit (INDEPENDENT_AMBULATORY_CARE_PROVIDER_SITE_OTHER): Payer: Medicare PPO | Admitting: *Deleted

## 2017-09-09 VITALS — BP 120/84 | HR 80 | Temp 97.7°F | Ht 62.0 in | Wt 236.5 lb

## 2017-09-09 VITALS — BP 120/84 | HR 80 | Wt 236.0 lb

## 2017-09-09 DIAGNOSIS — G8929 Other chronic pain: Secondary | ICD-10-CM

## 2017-09-09 DIAGNOSIS — Z0000 Encounter for general adult medical examination without abnormal findings: Secondary | ICD-10-CM | POA: Diagnosis not present

## 2017-09-09 DIAGNOSIS — M25561 Pain in right knee: Secondary | ICD-10-CM

## 2017-09-09 LAB — PAP IG W/ RFLX HPV ASCU

## 2017-09-09 MED ORDER — METHYLPREDNISOLONE ACETATE 40 MG/ML IJ SUSP
40.0000 mg | Freq: Once | INTRAMUSCULAR | Status: AC
  Administered 2017-09-09: 40 mg via INTRAMUSCULAR

## 2017-09-09 NOTE — Progress Notes (Signed)
Pre visit review using our clinic review tool, if applicable. No additional management support is needed unless otherwise documented below in the visit note. 

## 2017-09-09 NOTE — Patient Instructions (Signed)
Let us know if you need anything.  

## 2017-09-09 NOTE — Progress Notes (Signed)
Chief Complaint  Patient presents with  . Follow-up    injection right knee   Pt here for R knee injection. Has worked well in past, it has been >3 mo since last one.   Procedure Note; Knee injection Verbal consent obtained. The area of the antero-lateral joint line was palpated and cleaned with alcohol x1. A 27-gauge needle was used to enter the joint space anterolaterally with ease. 40 mg of Depomedrol with 2 mL of 1% lidocaine was injected. The patient tolerated the procedure well. There were no complications noted.  Chronic pain of right knee - Plan: PR DRAIN/INJECT LARGE JOINT/BURSA  Orders as above. Warning s/s's discussed w pt, she is aware as this is not her first injection. F/u as originally scheduled. Pt voiced understanding and agreement to the plan.   [place], DO 09/09/17 7:44 AM

## 2017-09-09 NOTE — Patient Instructions (Signed)
Continue to eat heart healthy diet (full of fruits, vegetables, whole grains, lean protein, water--limit salt, fat, and sugar intake) and increase physical activity as tolerated.  Continue doing brain stimulating activities (puzzles, reading, adult coloring books, staying active) to keep memory sharp.   Bring a copy of your living will and/or healthcare power of attorney to your next office visit.  Have a great time in [place]!!!!!   Katelyn Fisher , Thank you for taking time to come for your Medicare Wellness Visit. I appreciate your ongoing commitment to your health goals. Please review the following plan we discussed and let me know if I can assist you in the future.   These are the goals we discussed: Goals    . Increase physical activity     Continue chair exercises and maybe implement Walk Away the Pounds [name]     . maintain health (pt-stated)    . Reduce portion size       This is a list of the screening recommended for you and due dates:  Health Maintenance  Topic Date Due  . Pneumonia vaccines (1 of 2 - PCV13) 09/24/2017  . Tetanus Vaccine  09/25/2017  . Hemoglobin A1C  01/22/2018  . Eye exam for diabetics  07/22/2018  . Complete foot exam   07/25/2018  . Pap Smear  09/04/2018  . Mammogram  11/13/2018  . Colon Cancer Screening  10/24/2021  . Flu Shot  Completed  . DEXA scan (bone density measurement)  Completed  .  Hepatitis C: One time screening is recommended by Center for Disease Control  (CDC) for  adults born from [birth year] through 1964.   Completed  . HIV Screening  Completed    Health Maintenance for Postmenopausal Women Menopause is a normal process in which your reproductive ability comes to an end. This process happens gradually over a span of months to years, usually between the ages of 40 and 81. Menopause is complete when you have missed 12 consecutive menstrual periods. It is important to talk with your health care provider about some of the most common  conditions that affect postmenopausal women, such as heart disease, cancer, and bone loss (osteoporosis). Adopting a healthy lifestyle and getting preventive care can help to promote your health and wellness. Those actions can also lower your chances of developing some of these common conditions. What should I know about menopause? During menopause, you may experience a number of symptoms, such as:  Moderate-to-severe hot flashes.  Night sweats.  Decrease in sex drive.  Mood swings.  Headaches.  Tiredness.  Irritability.  Memory problems.  Insomnia.  Choosing to treat or not to treat menopausal changes is an individual decision that you make with your health care provider. What should I know about hormone replacement therapy and supplements? Hormone therapy products are effective for treating symptoms that are associated with menopause, such as hot flashes and night sweats. Hormone replacement carries certain risks, especially as you become older. If you are thinking about using estrogen or estrogen with progestin treatments, discuss the benefits and risks with your health care provider. What should I know about heart disease and stroke? Heart disease, heart attack, and stroke become more likely as you age. This may be due, in part, to the hormonal changes that your body experiences during menopause. These can affect how your body processes dietary fats, triglycerides, and cholesterol. Heart attack and stroke are both medical emergencies. There are many things that you can do to help prevent  heart disease and stroke:  Have your blood pressure checked at least every 1-2 years. High blood pressure causes heart disease and increases the risk of stroke.  If you are 56-71 years old, ask your health care provider if you should take aspirin to prevent a heart attack or a stroke.  Do not use any tobacco products, including cigarettes, chewing tobacco, or electronic cigarettes. If you need  help quitting, ask your health care provider.  It is important to eat a healthy diet and maintain a healthy weight. ? Be sure to include plenty of vegetables, fruits, low-fat dairy products, and lean protein. ? Avoid eating foods that are high in solid fats, added sugars, or salt (sodium).  Get regular exercise. This is one of the most important things that you can do for your health. ? Try to exercise for at least 150 minutes each week. The type of exercise that you do should increase your heart rate and make you sweat. This is known as moderate-intensity exercise. ? Try to do strengthening exercises at least twice each week. Do these in addition to the moderate-intensity exercise.  Know your numbers.Ask your health care provider to check your cholesterol and your blood glucose. Continue to have your blood tested as directed by your health care provider.  What should I know about cancer screening? There are several types of cancer. Take the following steps to reduce your risk and to catch any cancer development as early as possible. Breast Cancer  Practice breast self-awareness. ? This means understanding how your breasts normally appear and feel. ? It also means doing regular breast self-exams. Let your health care provider know about any changes, no matter how small.  If you are 16 or older, have a clinician do a breast exam (clinical breast exam or CBE) every year. Depending on your age, family history, and medical history, it may be recommended that you also have a yearly breast X-ray (mammogram).  If you have a family history of breast cancer, talk with your health care provider about genetic screening.  If you are at high risk for breast cancer, talk with your health care provider about having an MRI and a mammogram every year.  Breast cancer (BRCA) gene test is recommended for women who have family members with BRCA-related cancers. Results of the assessment will determine the need  for genetic counseling and BRCA1 and for BRCA2 testing. BRCA-related cancers include these types: ? Breast. This occurs in males or females. ? Ovarian. ? Tubal. This may also be called fallopian tube cancer. ? Cancer of the abdominal or pelvic lining (peritoneal cancer). ? Prostate. ? Pancreatic.  Cervical, Uterine, and Ovarian Cancer Your health care provider may recommend that you be screened regularly for cancer of the pelvic organs. These include your ovaries, uterus, and vagina. This screening involves a pelvic exam, which includes checking for microscopic changes to the surface of your cervix (Pap test).  For women ages 21-65, health care providers may recommend a pelvic exam and a Pap test every three years. For women ages 80-65, they may recommend the Pap test and pelvic exam, combined with testing for human papilloma virus (HPV), every five years. Some types of HPV increase your risk of cervical cancer. Testing for HPV may also be done on women of any age who have unclear Pap test results.  Other health care providers may not recommend any screening for nonpregnant women who are considered low risk for pelvic cancer and have no symptoms. Ask  your health care provider if a screening pelvic exam is right for you.  If you have had past treatment for cervical cancer or a condition that could lead to cancer, you need Pap tests and screening for cancer for at least 20 years after your treatment. If Pap tests have been discontinued for you, your risk factors (such as having a new sexual partner) need to be reassessed to determine if you should start having screenings again. Some women have medical problems that increase the chance of getting cervical cancer. In these cases, your health care provider may recommend that you have screening and Pap tests more often.  If you have a family history of uterine cancer or ovarian cancer, talk with your health care provider about genetic screening.  If you  have vaginal bleeding after reaching menopause, tell your health care provider.  There are currently no reliable tests available to screen for ovarian cancer.  Lung Cancer Lung cancer screening is recommended for adults 65-21 years old who are at high risk for lung cancer because of a history of smoking. A yearly low-dose CT scan of the lungs is recommended if you:  Currently smoke.  Have a history of at least 30 pack-years of smoking and you currently smoke or have quit within the past 15 years. A pack-year is smoking an average of one pack of cigarettes per day for one year.  Yearly screening should:  Continue until it has been 15 years since you quit.  Stop if you develop a health problem that would prevent you from having lung cancer treatment.  Colorectal Cancer  This type of cancer can be detected and can often be prevented.  Routine colorectal cancer screening usually begins at age 95 and continues through age 43.  If you have risk factors for colon cancer, your health care provider may recommend that you be screened at an earlier age.  If you have a family history of colorectal cancer, talk with your health care provider about genetic screening.  Your health care provider may also recommend using home test kits to check for hidden blood in your stool.  A small camera at the end of a tube can be used to examine your colon directly (sigmoidoscopy or colonoscopy). This is done to check for the earliest forms of colorectal cancer.  Direct examination of the colon should be repeated every 5-10 years until age 61. However, if early forms of precancerous polyps or small growths are found or if you have a family history or genetic risk for colorectal cancer, you may need to be screened more often.  Skin Cancer  Check your skin from head to toe regularly.  Monitor any moles. Be sure to tell your health care provider: ? About any new moles or changes in moles, especially if there  is a change in a mole's shape or color. ? If you have a mole that is larger than the size of a pencil eraser.  If any of your family members has a history of skin cancer, especially at a young age, talk with your health care provider about genetic screening.  Always use sunscreen. Apply sunscreen liberally and repeatedly throughout the day.  Whenever you are outside, protect yourself by wearing long sleeves, pants, a wide-brimmed hat, and sunglasses.  What should I know about osteoporosis? Osteoporosis is a condition in which bone destruction happens more quickly than new bone creation. After menopause, you may be at an increased risk for osteoporosis. To help prevent  osteoporosis or the bone fractures that can happen because of osteoporosis, the following is recommended:  If you are 78-35 years old, get at least 1,000 mg of calcium and at least 600 mg of vitamin D per day.  If you are older than age 17 but younger than age 1, get at least 1,200 mg of calcium and at least 600 mg of vitamin D per day.  If you are older than age 62, get at least 1,200 mg of calcium and at least 800 mg of vitamin D per day.  Smoking and excessive alcohol intake increase the risk of osteoporosis. Eat foods that are rich in calcium and vitamin D, and do weight-bearing exercises several times each week as directed by your health care provider. What should I know about how menopause affects my mental health? Depression may occur at any age, but it is more common as you become older. Common symptoms of depression include:  Low or sad mood.  Changes in sleep patterns.  Changes in appetite or eating patterns.  Feeling an overall lack of motivation or enjoyment of activities that you previously enjoyed.  Frequent crying spells.  Talk with your health care provider if you think that you are experiencing depression. What should I know about immunizations? It is important that you get and maintain your  immunizations. These include:  Tetanus, diphtheria, and pertussis (Tdap) booster vaccine.  Influenza every year before the flu season begins.  Pneumonia vaccine.  Shingles vaccine.  Your health care provider may also recommend other immunizations. This information is not intended to replace advice given to you by your health care provider. Make sure you discuss any questions you have with your health care provider. Document Released: 08/31/2005 Document Revised: 01/27/2016 Document Reviewed: 04/12/2015 Elsevier Interactive Patient Education  2018 [employer].

## 2017-09-09 NOTE — Addendum Note (Signed)
Addended by: Sharon Seller B on: 09/09/2017 08:34 AM   Modules accepted: Orders

## 2017-09-10 ENCOUNTER — Encounter: Payer: Self-pay | Admitting: *Deleted

## 2017-09-16 NOTE — Progress Notes (Signed)
Noted. Agree with above.  [place], DO 09/16/17 7:09 AM

## 2017-09-20 DIAGNOSIS — M8580 Other specified disorders of bone density and structure, unspecified site: Secondary | ICD-10-CM

## 2017-09-20 HISTORY — DX: Other specified disorders of bone density and structure, unspecified site: M85.80

## 2017-09-25 ENCOUNTER — Other Ambulatory Visit: Payer: Self-pay | Admitting: Gynecology

## 2017-09-25 DIAGNOSIS — Z780 Asymptomatic menopausal state: Secondary | ICD-10-CM

## 2017-09-30 ENCOUNTER — Other Ambulatory Visit: Payer: Self-pay | Admitting: Family Medicine

## 2017-09-30 DIAGNOSIS — Z1231 Encounter for screening mammogram for malignant neoplasm of breast: Secondary | ICD-10-CM

## 2017-10-09 ENCOUNTER — Other Ambulatory Visit: Payer: Self-pay | Admitting: Hematology and Oncology

## 2017-10-09 ENCOUNTER — Other Ambulatory Visit: Payer: Self-pay | Admitting: Family Medicine

## 2017-10-15 ENCOUNTER — Encounter: Payer: Self-pay | Admitting: Gynecology

## 2017-10-15 ENCOUNTER — Other Ambulatory Visit: Payer: Self-pay | Admitting: Gynecology

## 2017-10-15 ENCOUNTER — Ambulatory Visit (INDEPENDENT_AMBULATORY_CARE_PROVIDER_SITE_OTHER): Payer: Medicare PPO

## 2017-10-15 DIAGNOSIS — M85852 Other specified disorders of bone density and structure, left thigh: Secondary | ICD-10-CM

## 2017-10-15 DIAGNOSIS — Z780 Asymptomatic menopausal state: Secondary | ICD-10-CM

## 2017-10-15 DIAGNOSIS — M8588 Other specified disorders of bone density and structure, other site: Secondary | ICD-10-CM | POA: Diagnosis not present

## 2017-10-25 ENCOUNTER — Encounter: Payer: Self-pay | Admitting: Family Medicine

## 2017-10-25 ENCOUNTER — Ambulatory Visit: Payer: Medicare PPO | Admitting: Family Medicine

## 2017-10-25 VITALS — BP 120/84 | HR 93 | Temp 98.3°F | Ht 62.0 in | Wt 233.4 lb

## 2017-10-25 DIAGNOSIS — M545 Low back pain, unspecified: Secondary | ICD-10-CM

## 2017-10-25 MED ORDER — TRAMADOL HCL 50 MG PO TABS: 50.0000 mg | ORAL_TABLET | Freq: Two times a day (BID) | ORAL | 0 refills | Status: DC | PRN

## 2017-10-25 NOTE — Progress Notes (Signed)
Pre visit review using our clinic review tool, if applicable. No additional management support is needed unless otherwise documented below in the visit note. 

## 2017-10-25 NOTE — Progress Notes (Signed)
Musculoskeletal Exam  Patient: Katelyn Fisher DOB: 31-May-1951  DOS: 10/25/2017  SUBJECTIVE:  Chief Complaint:   Chief Complaint  Patient presents with  . Follow-up    know on right side    Katelyn Fisher is a 67 y.o.  female for evaluation and treatment of R side pain.   Onset:  2 days ago. Side bent to R and felt a pop and felt a knot.  Location: R side Character:  sharp  Progression of issue:  is unchanged Associated symptoms: none Treatment: to date has been- none.   Neurovascular symptoms: no  ROS: Musculoskeletal/Extremities: +R side pain  Past Medical History:  Diagnosis Date  . Anxiety   . Arthritis    back  . Asthma   . Benign paroxysmal positional vertigo 11/01/2012  . Cancer ([place]) 12/22/2014   endometrial  . Depression   . Endometrial cancer ([place]) 2015  . Family history of breast cancer   . Family history of colon cancer   . Family history of ovarian cancer   . Family history of prostate cancer   . GERD (gastroesophageal reflux disease)   . History of colonic diverticulitis   . Hx of colonic polyps   . Hyperlipidemia   . Hypertension   . 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  . Low back pain radiating to right leg 08/18/2012  . Obesity   . Osteopenia 09/2017   T score -1.2 FRAX 3.2% / 0.3%  . Personal history of chemotherapy 2015  . Radiation 12/17/14-01/03/15   vaginal brachytherapy  . Thyroid disease 06/01/2013    Objective: VITAL SIGNS: BP 120/84 (BP Location: Left Arm, Patient Position: Sitting, Cuff Size: Large)   Pulse 93   Temp 98.3 F (36.8 C) (Oral)   Ht 5\' 2"  (1.575 m)   Wt 233 lb 6 oz (105.9 kg)   SpO2 94%   BMI 42.68 kg/m  Constitutional: Well formed, well developed. No acute distress. Cardiovascular: Brisk cap refill Thorax & Lungs: No accessory muscle use Musculoskeletal: R side.   Tenderness to palpation: Yes, over mid ax line on R at apprx rib 12, there is TTP, no mass appreciated Deformity: no Ecchymosis: no Neurologic:  Normal sensory function.  Psychiatric: Normal mood. Age appropriate judgment and insight. Alert & oriented x 3.    Assessment:  Acute right-sided low back pain without sciatica - Plan: traMADol (ULTRAM) 50 MG tablet  Plan: Orders as above. Tylenol, stretch/massage area, heat, ice. Tramadol for breakthrough pain. I do not feel a knot, could be local inflammation. Will have her let us know if this knot does not improve and we could check an ultrasound.  F/u prn otherwise. The patient voiced understanding and agreement to the plan.   [place], DO 10/25/17  8:04 AM

## 2017-10-25 NOTE — Patient Instructions (Signed)
Heat (pad or rice pillow in microwave) over affected area, 10-15 minutes twice daily.   Ice/cold pack over area for 10-15 min twice daily.  Massage the area as tolerated.  OK to take Tylenol 1000 mg (2 extra strength tabs) or 975 mg (3 regular strength tabs) every 6 hours as needed.  Tramadol for breakthrough pain.

## 2017-10-28 ENCOUNTER — Encounter: Payer: Self-pay | Admitting: Obstetrics & Gynecology

## 2017-10-31 ENCOUNTER — Encounter: Payer: Self-pay | Admitting: Obstetrics & Gynecology

## 2017-10-31 ENCOUNTER — Ambulatory Visit (INDEPENDENT_AMBULATORY_CARE_PROVIDER_SITE_OTHER): Payer: Medicare PPO | Admitting: Obstetrics & Gynecology

## 2017-10-31 VITALS — BP 132/84

## 2017-10-31 DIAGNOSIS — Z8542 Personal history of malignant neoplasm of other parts of uterus: Secondary | ICD-10-CM | POA: Diagnosis not present

## 2017-10-31 DIAGNOSIS — C541 Malignant neoplasm of endometrium: Secondary | ICD-10-CM

## 2017-10-31 DIAGNOSIS — R102 Pelvic and perineal pain: Secondary | ICD-10-CM | POA: Diagnosis not present

## 2017-10-31 NOTE — Patient Instructions (Addendum)
1. Pelvic pain in female Possible muscular pain or joint pain given that the pain is increased with physical activity.  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  - Urinalysis,Complete w/RFL Culture  2. 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  Had a total hysterectomy with bilateral salpingo-oophorectomy and staging and postop chemotherapy and radiation treatment which finished in 2016.  Pap negative 09/05/2017.    Angelita Ingles, good seeing you today!

## 2017-10-31 NOTE — Progress Notes (Signed)
    Katelyn Fisher [birth year] [id number]        67 y.o.  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  Had a total hysterectomy with bilateral salpingo-oophorectomy and staging and postop chemotherapy and radiation treatment which finished in 2016.   Annual/Gyn exam normal 09/05/2017 with a negative Pap test.  Mid pelvic pain, worsened by movement x 3 weeks.  Pain is also increased when her bladder is full and better just after passing urine.  Abstinent x 08/2017.  No vaginal bleeding.  No UTI Sx.  BMs normal daily.  Good appetite.  No N/V.  No fever.   OB History  Gravida Para Term Preterm AB Living  2 2       3   SAB TAB Ectopic Multiple Live Births               # Outcome Date GA Lbr Len/2nd Weight Sex Delivery Anes PTL Lv  2 Para           1 Para             Obstetric Comments  1 adopted child    Past medical history,surgical history, problem list, medications, allergies, family history and social history were all reviewed and documented in the EPIC chart.   Directed ROS with pertinent positives and negatives documented in the history of present illness/assessment and plan.  Exam:  Vitals:   10/31/17 1217  BP: 132/84   General appearance:  Normal  Abdomen: Normal.  No inguinal LN felt.   Gynecologic exam: Vulva normal.  Vagina normal.  Normal secretions.  No lesion.  No bleeding.  Bimanual exam:  Absent Uterus/Ovaries.  Vaginal vault soft, NT.  No pelvic mass felt.  Pap test negative 09/05/2017.  U/A negative today.   Assessment/Plan:  67 y.o. G2P2   1. Pelvic pain in female Possible muscular pain or joint pain given that the pain is increased with physical activity.  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  - Urinalysis,Complete w/RFL  Culture  2. 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  Had a total hysterectomy with bilateral salpingo-oophorectomy and staging and postop chemotherapy and radiation treatment which finished in 2016.  Pap negative 09/05/2017.    Counseling on above issues and coordination of care more than 50% for 25 minutes  Princess Bruins MD, 12:39 PM 10/31/2017

## 2017-11-01 LAB — URINALYSIS, COMPLETE W/RFL CULTURE
BACTERIA UA: NONE SEEN /HPF
Bilirubin Urine: NEGATIVE
Glucose, UA: NEGATIVE
HGB URINE DIPSTICK: NEGATIVE
HYALINE CAST: NONE SEEN /LPF
KETONES UR: NEGATIVE
Leukocyte Esterase: NEGATIVE
Nitrites, Initial: NEGATIVE
PROTEIN: NEGATIVE
RBC / HPF: NONE SEEN /HPF (ref 0–2)
Specific Gravity, Urine: 1.002 (ref 1.001–1.03)
WBC UA: NONE SEEN /HPF (ref 0–5)
pH: 6 (ref 5.0–8.0)

## 2017-11-01 LAB — NO CULTURE INDICATED

## 2017-11-04 ENCOUNTER — Telehealth: Payer: Self-pay | Admitting: *Deleted

## 2017-11-04 DIAGNOSIS — R102 Pelvic and perineal pain: Secondary | ICD-10-CM

## 2017-11-04 NOTE — Telephone Encounter (Signed)
-----   Message from Princess Bruins, MD sent at 10/31/2017 12:47 PM EDT ----- Regarding: Schedule CT scan of Pelvic with contrast Pelvic pain x 3 weeks.  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  Had a total hysterectomy with bilateral salpingo-oophorectomy and staging and postop chemotherapy and radiation treatment which finished in 2016.

## 2017-11-04 NOTE — Telephone Encounter (Signed)
Order placed at [facility] they will contact pt to schedule, number given to patient to call as well. [phone].

## 2017-11-05 ENCOUNTER — Encounter: Payer: Self-pay | Admitting: *Deleted

## 2017-11-05 DIAGNOSIS — Z1239 Encounter for other screening for malignant neoplasm of breast: Secondary | ICD-10-CM

## 2017-11-05 HISTORY — DX: Encounter for other screening for malignant neoplasm of breast: Z12.39

## 2017-11-07 NOTE — Telephone Encounter (Signed)
Pt scheduled at 11/19/17 @ 9am

## 2017-11-11 ENCOUNTER — Other Ambulatory Visit: Payer: Self-pay | Admitting: Family Medicine

## 2017-11-11 DIAGNOSIS — I10 Essential (primary) hypertension: Secondary | ICD-10-CM

## 2017-11-11 NOTE — Telephone Encounter (Signed)
Juliann Pulse attempted prior authorization for pt ct scan, but was informed by [facility] that patient will need to schedule with preferred imaging location by calling [phone]. I called and gave the authorization again and approval # [id number] for 30 days. She gave me a list if preferred location and I chose [facility] location [phone]. I called and Sharyn Lull said to fax order to [phone] and they will call pt to schedule. I called pt and told her this information as well and CT scan at [facility] will be canceled.

## 2017-11-13 ENCOUNTER — Encounter: Payer: Self-pay | Admitting: *Deleted

## 2017-11-14 NOTE — Telephone Encounter (Signed)
Patient is scheduled on 11/18/17 for CT scan at [facility], Clarene Critchley asked that I fax and order to [phone] for patient to have BUN and creatine blood test drawn.

## 2017-11-18 ENCOUNTER — Telehealth: Payer: Self-pay

## 2017-11-18 NOTE — Telephone Encounter (Signed)
Sonia Baller at [facility] called to let me know that they have found out that patient is allergic to contrast. She is scheduled this afternoon and we will need to let [facility] know and get new PA.  I called [employer] and notified rep. She updated current PA and changed CPT code to [id number] and stated PA # will remain the same. Sonia Baller was notified.

## 2017-11-19 ENCOUNTER — Other Ambulatory Visit: Payer: Medicare PPO

## 2017-11-19 ENCOUNTER — Ambulatory Visit
Admission: RE | Admit: 2017-11-19 | Discharge: 2017-11-19 | Disposition: A | Discharge status: Home / Self Care | Payer: Medicare PPO | Source: Ambulatory Visit | Attending: Family Medicine | Admitting: Family Medicine

## 2017-11-19 DIAGNOSIS — Z1231 Encounter for screening mammogram for malignant neoplasm of breast: Secondary | ICD-10-CM | POA: Diagnosis not present

## 2017-11-19 DIAGNOSIS — C541 Malignant neoplasm of endometrium: Secondary | ICD-10-CM | POA: Diagnosis not present

## 2017-11-19 DIAGNOSIS — R102 Pelvic and perineal pain: Secondary | ICD-10-CM | POA: Diagnosis not present

## 2017-11-29 ENCOUNTER — Telehealth: Payer: Self-pay | Admitting: *Deleted

## 2017-11-29 NOTE — Telephone Encounter (Signed)
Pt informed

## 2017-11-29 NOTE — Telephone Encounter (Signed)
Patient called requesting Ct scan results at [facility] imaging, copy placed on your desk to review. Please advise

## 2017-11-29 NOTE — Telephone Encounter (Signed)
The CT scan was essentially normal.  Certainly no evidence of recurrent cancer or other pathology.

## 2017-12-05 ENCOUNTER — Inpatient Hospital Stay: Payer: Medicare PPO | Attending: Gynecologic Oncology | Admitting: Gynecologic Oncology

## 2017-12-05 ENCOUNTER — Encounter: Payer: Self-pay | Admitting: Gynecologic Oncology

## 2017-12-05 VITALS — BP 134/91 | HR 88 | Temp 98.1°F | Resp 20 | Ht 62.0 in | Wt 236.0 lb

## 2017-12-05 DIAGNOSIS — Z1239 Encounter for other screening for malignant neoplasm of breast: Secondary | ICD-10-CM

## 2017-12-05 DIAGNOSIS — Z90710 Acquired absence of both cervix and uterus: Secondary | ICD-10-CM

## 2017-12-05 DIAGNOSIS — Z90722 Acquired absence of ovaries, bilateral: Secondary | ICD-10-CM | POA: Diagnosis not present

## 2017-12-05 DIAGNOSIS — Z923 Personal history of irradiation: Secondary | ICD-10-CM | POA: Diagnosis not present

## 2017-12-05 DIAGNOSIS — Z8542 Personal history of malignant neoplasm of other parts of uterus: Secondary | ICD-10-CM | POA: Diagnosis not present

## 2017-12-05 DIAGNOSIS — C541 Malignant neoplasm of endometrium: Secondary | ICD-10-CM

## 2017-12-05 DIAGNOSIS — Z9221 Personal history of antineoplastic chemotherapy: Secondary | ICD-10-CM | POA: Diagnosis not present

## 2017-12-05 NOTE — Progress Notes (Signed)
GYNECOLOGIC ONCOLOGY OFFICE NOTE   Chief Complaint: Uterine serous endometrial cancer. Surveillance   Assessment/Plan: Latriece Anstine is a 67 y.o. woman with pre op diagnosis of grade 1 endometrial cancer . 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   6//6 cycles adjuvant platinum and taxane chemotherapy and  vaginal brachytherapy completed 12/2014 No evidence of disease, c/o vaginal strain, imaging negative F/U 02/2018 for serial examination   Follow-up with Dr. Dellis Filbert as scheduled Follow-up with Dr. Alvy Bimler as scheduled. Will discuss genetic counseling given Family history  Mammogram 10/2017 and colonoscopy 10/2016 without dysplasia  Discussed that removal of port now is appropriate.  Jelisa A Garrow wishes to keep it in place at present.  Advised to contact us if she changes her mind   History of Present Illness: Hartlee Amedee is a 67 y.o. who presented with a two-week history of postmenopausal bleeding. A pelvic ultrasound showed fibroids and a thickened endometrial stripe. She subsequently underwent an endometrial biopsy which was concerning for at least grade 1 endometrioid adenocarcinoma.   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 [place] 05-07-14 reportedly had thickened endometrial stripe, with 3 fibroids largest 5x 5x 3.5 cm; endometrial biopsy 05-07-14 reportedly was concerning for at least FIGO grade 1 endometrial carcinoma      06/23/2014 Surgery    Surgery was robotic hysterectomy, BSO, bilateral pelvic and para-aortic nodes by Dr Skeet Latch at [facility] on 06-23-14      06/23/2014 Pathology Results    Pathology [id number]) found mixed high grade carcinoma (serous 50% and endometrioid 50%) FIGO  grade 3, involving inner half of myometrium with depth 5 mm where wall 3 cm thick, no serosal or lower uterine segment involvement, no cervical or adnexal involvement, LVSI present, 0/15 nodes involved      07/30/2014 PET scan    PET/ CT at [facility] 07-30-14 had no findings of concern for distant disease      08/16/2014 Procedure    Ultrasound and fluoroscopically guided right internal jugular single lumen power port catheter insertion. Tip in the SVC/RA junction. Catheter ready for use.      08/24/2014 - 12/06/2014 Chemotherapy    She received 6 cycles of carbo/Taxol      11/29/2014 Genetic Testing    Genetics testing normal 11-2014 (comprehensive cancer panel by [facility])      12/17/2014 - 01/03/2015 Radiation Therapy    Radiation treatment dates:   12/17/2014, 12/22/2014, 12/27/2014, 12/31/2014, 01/03/2015  Site/dose:   Proximal 4.5 cm of Vaginal cuff / 30 Gy in 5 fractions Rx to 0.1cm submucosal depth  Beams/energy:   HDR brachytherapy/ Ir 192      03/03/2015 Imaging    Interval hysterectomy. No evidence of local recurrence or metastatic disease. 2. No acute abdominal pelvic findings. 3. Distal colonic diverticulosis with possible rectal wall thickening, possibly related to prior radiation therapy. Correlate clinically.      11/20/2017 Imaging    C/O vaginal pain CT A/P [facility] IMPRESSION: 1. No acute process or explanation for pelvic pain. 2. Hysterectomy. No evidence of pelvic metastasis.         Pathology: A: Uterus, cervix, bilateral ovaries and fallopian tubes, total hysterectomy and bilateral salpingo-oophorectomy  Histologic type: mixed high grade  carcinoma (serous carcinoma 50%, endometrioid adenocarcinoma 50%)   Tumor size (gross): 2.0 cm (microscopic measurement)   Myometrial invasion: Inner half Depth: 5 mm Wall thickness: 3 cm Percent: 17% Lymphovascular space invasion: present   Endometrium: 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)  predominantly   - Six lymph nodes negative for metastatic carcinoma (0/6)   C: Lymph nodes, left pelvic, excision  - Nine lymph nodes, negative for metastatic carcinoma (0/9)   Post op PET to assess LN without evidence of metastatic disease.  Extended cancer gene panel negative.   CT 03/03/2015 IMPRESSION: 1. Interval hysterectomy. No evidence of local recurrence or metastatic disease. 2. No acute abdominal pelvic findings. 3. Distal colonic diverticulosis with possible rectal wall thickening, possibly related to prior radiation therapy. Correlate clinically.  INTERVAL HistoryDoing well.  Reports sensation og vaginal strain in April.  Imaging at [facility] was unremarkable.  No vaginal bleeding,  , no abdominal bloating shortness of breath. Mammogram 10/2017 wnl  Past Medical History:  Past Medical History   Past Medical History   Diagnosis  Date   .  Asthma    .  Morbid obesity (RAF-HCC)      BMI 44   .  Hypertension    .  GERD (gastroesophageal reflux disease)    .  Arthritis    .  Depression    .  Back pain    .  Thyroid disease  05/2013   .  Vertigo  10/2012     benign paroxysmal positional       Past Surgical History:  Past Surgical History   Past Surgical History   Procedure  Laterality  Date   .  Dilation and curettage of uterus   2102     for PMB   .  Hysteroscopy   2012     with resection of fibroids   .  Tubal ligation     .  Laparoscopic cholecystectomy     .  Pr laparoscopy w tot hysterectuterus <=250 gram w tube/ovary  Midline  06/23/2014     Procedure: ROBOTIC LAPAROSCOPY, SURGICAL, WITH TOTAL HYSTERECTOMY, FOR UTERUS 250 G OR LESS; W/REMOVAL TUBE(S) AND/OR OVARY(S); Surgeon: Devra Dopp, MD; Location: MAIN OR [facility]; Service: Gynecology Oncology   .  Pr lap,pelvic lymphadenectomy/bx  Bilateral  06/23/2014     Procedure: ROBOTIC LAPAROSCOPY, SURGICAL; WITH BILATERAL TOTAL PELVIC LYMPHADENECTOMY PERI-AORTIC LYMPH NODE SAMPLE, SGL/MULT; Surgeon: Devra Dopp, MD; Location: MAIN OR [facility]; Service: Gynecology Oncology      Colonoscopy 10/2016 with removal of adenomatous polyps.    Family History:  Family History   Family History   Problem  Relation  Age of Onset   .  Heart disease  Maternal Aunt    .  Lung cancer  Father    .  Cancer  Maternal Aunt  63     Breast   .  Cancer  Maternal Aunt  26     Breast   .  Diabetes  Mother    .  Lupus  Mother    .  Diabetes  Father    .  Prostate cancer  Father    .  Diabetes  Brother    .  Heart disease  Brother      ROS: No visual changes, no nausea or vomiting, no sensory or motor changes, no flank pain, no extremity edema, no incontinence, no vaginal , recta or bladder bleeding. Reports sensation of muscular strain in  the vagina. No change in bowel or bladder habits, Otherwise 10 point ROS negative.   Physical Exam: BP (!) 134/91 (BP Location: Left Arm, Patient Position: Sitting)   Pulse 88   Temp 98.1 F (36.7 C) (Oral)   Resp 20   Ht 5\' 2"  (1.575 m)   Wt 236 lb (107 kg)   SpO2 100%   BMI 43.16 kg/m   Wt Readings from Last 3 Encounters:  12/05/17 236 lb (107 kg)  10/25/17 233 lb 6 oz (105.9 kg)  09/09/17 236 lb (107 kg)   WD female in NAD Chest: CTA Back: No CVAT Abdomen: Obese. Soft, nondistended, nontender to palpation. No masses or hepatosplenomegaly appreciated. Port sites intact, no tenderness or masses  Pelvic:  Nl EGBUS, no massess, no discharge no bleeding, atropic vagina.  No cul de sac masses. NO vaginal masses ot tenderness Rectal:  Good tone no masses. Back:  No CVAT LN:  No cervical supraclavicular or inguinal adenopathy Ext No CCE

## 2017-12-05 NOTE — Patient Instructions (Signed)
Follow up with Dr. Skeet Latch in 3 months. Call if abdominal pain gets worse or does not get better prior to your next appointment.

## 2018-01-31 ENCOUNTER — Telehealth: Payer: Self-pay | Admitting: *Deleted

## 2018-01-31 NOTE — Telephone Encounter (Signed)
Called and left the patient a message to call the office back. Need to move appt on 8/29 from 9:30am to 11am. Dr. Skeet Latch in the OR in the morning.

## 2018-02-03 ENCOUNTER — Encounter: Payer: Self-pay | Admitting: Pediatrics

## 2018-02-03 ENCOUNTER — Ambulatory Visit (INDEPENDENT_AMBULATORY_CARE_PROVIDER_SITE_OTHER): Payer: Medicare PPO | Admitting: Pediatrics

## 2018-02-03 VITALS — BP 108/70 | HR 86 | Temp 98.5°F | Resp 16

## 2018-02-03 DIAGNOSIS — I10 Essential (primary) hypertension: Secondary | ICD-10-CM

## 2018-02-03 DIAGNOSIS — Z8542 Personal history of malignant neoplasm of other parts of uterus: Secondary | ICD-10-CM | POA: Diagnosis not present

## 2018-02-03 DIAGNOSIS — T7800XD Anaphylactic reaction due to unspecified food, subsequent encounter: Secondary | ICD-10-CM | POA: Diagnosis not present

## 2018-02-03 DIAGNOSIS — J4520 Mild intermittent asthma, uncomplicated: Secondary | ICD-10-CM

## 2018-02-03 HISTORY — DX: Personal history of malignant neoplasm of other parts of uterus: Z85.42

## 2018-02-03 MED ORDER — ALBUTEROL SULFATE HFA 108 (90 BASE) MCG/ACT IN AERS: INHALATION_SPRAY | RESPIRATORY_TRACT | 0 refills | Status: DC

## 2018-02-03 NOTE — Patient Instructions (Signed)
Zyrtec 10 mg-take 1 tablet once a day if needed for runny nose or itchy eyes Pro-air 2 puffs every 4 hours if needed for wheezing or coughing spells Futicasone 2 sprays per nostril once a day if needed for stuffy nose Continue avoiding shellfish.  If you have an allergic reaction take Benadryl 50 mg every 4 hours and if you have life-threatening symptoms inject with EpiPen 0.3 mg Continue on your other medications Call us if you are not doing well on this treatment plan

## 2018-02-03 NOTE — Progress Notes (Signed)
  [place] [id number] Dept: [phone]  FOLLOW UP NOTE  Patient ID: Katelyn Fisher, female    DOB: Apr 23, 1951  Age: 67 y.o. MRN: [id number] Date of Office Visit: 02/03/2018  Assessment  Chief Complaint: Asthma (have some intermittent coughing)  HPI Katelyn Fisher presents for follow-up of asthma.  Her asthma has been well controlled.  She very rarely has to use Pro-air.  Her endometrial cancer is in remission.  She continues to avoid shellfish.  Her diabetes is well controlled  Current medications are outlined in the chart   Drug Allergies:  Allergies  Allergen Reactions  . Shellfish Allergy Hives, Shortness Of Breath and Swelling  . Oxycodone Swelling    Facial swelling and tightness  . Penicillins Swelling    Rash with welps  . Iodinated Diagnostic Agents Rash  . Sulfa Antibiotics Rash    Physical Exam: BP 108/70   Pulse 86   Temp 98.5 F (36.9 C) (Oral)   Resp 16   SpO2 97%    Physical Exam  Constitutional: She is oriented to person, place, and time. She appears well-developed and well-nourished.  HENT:  Eyes normal  Ears normal.  Nose normal.  Pharynx normal.  Neck: Neck supple. No thyromegaly present.  Cardiovascular:  S1 S2 normal no murmur  Pulmonary/Chest:  Clear to percussion and  auscultation  Lymphadenopathy:    She has no cervical adenopathy.  Neurological: She is alert and oriented to person, place, and time.  Psychiatric: She has a normal mood and affect. Her behavior is normal. Judgment and thought content normal.  Vitals reviewed.   Diagnostics: FVC 1.82 L FEV1 1.39 L. Predicted  FVC 2.25 L predicted FEV1 1.75 L the spirometry is in the normal range  Assessment and Plan: 1. Mild intermittent asthma without complication   2. Essential hypertension   3. Anaphylactic shock due to food, subsequent encounter   4. History of endometrial cancer     Meds ordered this encounter  Medications  . albuterol (PROAIR HFA) 108  (90 Base) MCG/ACT inhaler    Sig: 2 puffs every 4 hours as needed for coughing or wheezing spells    Dispense:  3 Inhaler    Refill:  0    90 day supply    Patient Instructions  Zyrtec 10 mg-take 1 tablet once a day if needed for runny nose or itchy eyes Pro-air 2 puffs every 4 hours if needed for wheezing or coughing spells Futicasone 2 sprays per nostril once a day if needed for stuffy nose Continue avoiding shellfish.  If you have an allergic reaction take Benadryl 50 mg every 4 hours and if you have life-threatening symptoms inject with EpiPen 0.3 mg Continue on your other medications Call us if you are not doing well on this treatment plan   Return in about 6 months (around 08/06/2018).    Thank you for the opportunity to care for this patient.  Please do not hesitate to contact me with questions.  Penne Lash, M.D.  [facility] [address] [place], [place] [address] [phone]

## 2018-02-04 ENCOUNTER — Telehealth: Payer: Self-pay | Admitting: *Deleted

## 2018-02-04 NOTE — Telephone Encounter (Signed)
Returned the patient's call, left a message with the new appt time for 8/29.

## 2018-03-20 ENCOUNTER — Encounter: Payer: Self-pay | Admitting: Gynecologic Oncology

## 2018-03-20 ENCOUNTER — Inpatient Hospital Stay: Payer: Medicare PPO | Attending: Gynecologic Oncology | Admitting: Gynecologic Oncology

## 2018-03-20 VITALS — BP 116/68 | HR 92 | Temp 98.6°F | Resp 18 | Ht 62.0 in | Wt 236.2 lb

## 2018-03-20 DIAGNOSIS — C541 Malignant neoplasm of endometrium: Secondary | ICD-10-CM | POA: Diagnosis not present

## 2018-03-20 DIAGNOSIS — Z9221 Personal history of antineoplastic chemotherapy: Secondary | ICD-10-CM | POA: Diagnosis not present

## 2018-03-20 DIAGNOSIS — Z90710 Acquired absence of both cervix and uterus: Secondary | ICD-10-CM | POA: Diagnosis not present

## 2018-03-20 DIAGNOSIS — Z90722 Acquired absence of ovaries, bilateral: Secondary | ICD-10-CM | POA: Insufficient documentation

## 2018-03-20 DIAGNOSIS — Z923 Personal history of irradiation: Secondary | ICD-10-CM | POA: Diagnosis not present

## 2018-03-20 NOTE — Patient Instructions (Signed)
Please follow up with Dr. Skeet Latch in 6 months. Call the office in December to set up the appointment for February 2020.

## 2018-03-20 NOTE — Progress Notes (Signed)
GYNECOLOGIC ONCOLOGY OFFICE NOTE   Chief Complaint: Uterine serous endometrial cancer. Surveillance   Assessment/Plan: Katelyn Fisher is a 67 y.o. woman with pre op diagnosis of grade 1 endometrial cancer . 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   6//6 cycles adjuvant platinum and taxane chemotherapy and  vaginal brachytherapy completed 12/2014 No evidence of disease, c/o vaginal strain, imaging negative F/U 08/2018 for serial examination  Follow-up with Dr. Alvy Bimler as scheduled.   Mammogram 10/2017 and colonoscopy 10/2016 without dysplasia   History of Present Illness: Katelyn Fisher is a 67 y.o. who presented with a two-week history of postmenopausal bleeding. A pelvic ultrasound showed fibroids and a thickened endometrial stripe. She subsequently underwent an endometrial biopsy which was concerning for at least grade 1 endometrioid adenocarcinoma.   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 [place] 05-07-14 reportedly had thickened endometrial stripe, with 3 fibroids largest 5x 5x 3.5 cm; endometrial biopsy 05-07-14 reportedly was concerning for at least FIGO grade 1 endometrial carcinoma    06/23/2014 Surgery    Surgery was robotic hysterectomy, BSO, bilateral pelvic and para-aortic nodes by Dr Skeet Latch at [facility] on 06-23-14    06/23/2014 Pathology Results    Pathology [phone]) found mixed high grade carcinoma (serous 50% and endometrioid 50%) FIGO grade 3, involving inner half of myometrium with depth 5 mm where wall 3 cm thick, no serosal or lower uterine segment involvement, no cervical or adnexal involvement, LVSI present, 0/15 nodes involved    07/30/2014 PET scan    PET/ CT at [facility] 07-30-14 had no  findings of concern for distant disease    08/16/2014 Procedure    Ultrasound and fluoroscopically guided right internal jugular single lumen power port catheter insertion. Tip in the SVC/RA junction. Catheter ready for use.    08/24/2014 - 12/06/2014 Chemotherapy    She received 6 cycles of carbo/Taxol    11/29/2014 Genetic Testing    Genetics testing normal 11-2014 (comprehensive cancer panel by [facility])    12/17/2014 - 01/03/2015 Radiation Therapy    Radiation treatment dates:   12/17/2014, 12/22/2014, 12/27/2014, 12/31/2014, 01/03/2015  Site/dose:   Proximal 4.5 cm of Vaginal cuff / 30 Gy in 5 fractions Rx to 0.1cm submucosal depth  Beams/energy:   HDR brachytherapy/ Ir 192    03/03/2015 Imaging    Interval hysterectomy. No evidence of local recurrence or metastatic disease. 2. No acute abdominal pelvic findings. 3. Distal colonic diverticulosis with possible rectal wall thickening, possibly related to prior radiation therapy. Correlate clinically.    11/20/2017 Imaging    C/O vaginal pain CT A/P [facility] IMPRESSION: 1. No acute process or explanation for pelvic pain. 2. Hysterectomy. No evidence of pelvic metastasis.     INTERVAL History Doing well.   No vaginal bleeding,  , no abdominal bloating shortness of breath  Past Medical History:  Past Medical History   Past Medical History   Diagnosis  Date   .  Asthma    .  Morbid obesity (RAF-HCC)      BMI 44   .  Hypertension    .  GERD (gastroesophageal reflux disease)    .  Arthritis    .  Depression    .  Back pain    .  Thyroid disease  05/2013   .  Vertigo  10/2012     benign paroxysmal positional       Past Surgical History:  Past Surgical History   Past Surgical History   Procedure  Laterality  Date   .  Dilation and curettage of uterus   2102     for PMB   .  Hysteroscopy   2012     with resection of fibroids   .  Tubal ligation     .  Laparoscopic cholecystectomy     .  Pr laparoscopy w tot hysterectuterus <=250  gram w tube/ovary  Midline  06/23/2014     Procedure: ROBOTIC LAPAROSCOPY, SURGICAL, WITH TOTAL HYSTERECTOMY, FOR UTERUS 250 G OR LESS; W/REMOVAL TUBE(S) AND/OR OVARY(S); Surgeon: Devra Dopp, MD; Location: MAIN OR [facility]; Service: Gynecology Oncology   .  Pr lap,pelvic lymphadenectomy/bx  Bilateral  06/23/2014     Procedure: ROBOTIC LAPAROSCOPY, SURGICAL; WITH BILATERAL TOTAL PELVIC LYMPHADENECTOMY PERI-AORTIC LYMPH NODE SAMPLE, SGL/MULT; Surgeon: Devra Dopp, MD; Location: MAIN OR [facility]; Service: Gynecology Oncology      Colonoscopy 10/2016 with removal of adenomatous polyps.    Family History:  Family History   Family History   Problem  Relation  Age of Onset   .  Heart disease  Maternal Aunt    .  Lung cancer  Father    .  Cancer  Maternal Aunt  51     Breast   .  Cancer  Maternal Aunt  62     Breast   .  Diabetes  Mother    .  Lupus  Mother    .  Diabetes  Father    .  Prostate cancer  Father    .  Diabetes  Brother    .  Heart disease  Brother      ROS: No visual changes, no nausea or vomiting, no sensory or motor changes, no flank pain, no extremity edema, no incontinence, no vaginal , rectal or bladder bleeding.   No change in bowel or bladder habits, Otherwise 10 point ROS negative.   Physical Exam: BP 116/68 (BP Location: Left Arm, Patient Position: Sitting)   Pulse 92   Temp 98.6 F (37 C) (Oral)   Resp 18   Ht 5\' 2"  (1.575 m)   Wt 236 lb 3.2 oz (107.1 kg)   SpO2 99%   BMI 43.20 kg/m   Wt Readings from Last 3 Encounters:  03/20/18 236 lb 3.2 oz (107.1 kg)  12/05/17 236 lb (107 kg)  10/25/17 233 lb 6 oz (105.9 kg)   WD female in NAD Chest: CTA   Port-A-Cath removed  Back: No CVAT Abdomen: Obese. Soft, nondistended, nontender to palpation. No masses or hepatosplenomegaly appreciated. Port sites intact, no tenderness or masses  Pelvic:  Nl EGBUS, no massess, no discharge no bleeding, atropic vagina.  No cul de sac masses. No vaginal masses or tenderness,  nonodorous mild vaginal discharge Rectal:  Good tone no masses. Back:  No CVAT LN:  No cervical supraclavicular or inguinal adenopathy Ext No CCE

## 2018-05-08 DIAGNOSIS — I8310 Varicose veins of unspecified lower extremity with inflammation: Secondary | ICD-10-CM | POA: Diagnosis not present

## 2018-05-08 DIAGNOSIS — Z23 Encounter for immunization: Secondary | ICD-10-CM | POA: Diagnosis not present

## 2018-05-16 ENCOUNTER — Ambulatory Visit (INDEPENDENT_AMBULATORY_CARE_PROVIDER_SITE_OTHER): Payer: Medicare PPO | Admitting: Family Medicine

## 2018-05-16 ENCOUNTER — Encounter: Payer: Self-pay | Admitting: Family Medicine

## 2018-05-16 VITALS — BP 132/88 | HR 80 | Temp 97.8°F | Ht 62.0 in | Wt 234.0 lb

## 2018-05-16 DIAGNOSIS — S76111A Strain of right quadriceps muscle, fascia and tendon, initial encounter: Secondary | ICD-10-CM

## 2018-05-16 DIAGNOSIS — Z23 Encounter for immunization: Secondary | ICD-10-CM

## 2018-05-16 NOTE — Patient Instructions (Addendum)
Ice/cold pack over area for 10-15 min twice daily.  Heat (pad or rice pillow in microwave) over affected area, 10-15 minutes twice daily.   OK to take Tylenol 1000 mg (2 extra strength tabs) or 975 mg (3 regular strength tabs) every 6 hours as needed.  If we don't turn the corner in the next 3-4 weeks, please let me know.  Quadriceps Strain Rehab It is normal to feel mild stretching, pulling, tightness, or discomfort as you do these exercises, but you should stop right away if you feel sudden pain or your pain gets worse. Stretching and range of motion exercises These exercises warm up your muscles and joints and improve the movement and flexibility of your thigh. These exercises can also help to relieve stiffness or swelling. Exercise A: Heel slides   1. Lie on your back with both knees straight. If this causes back discomfort, bend the knee of your healthy leg, placing your foot flat on the floor. 2. Slowly slide your left / right heel back toward your buttocks until you feel a gentle stretch in the front of your knee or thigh. 3. Hold for 30 seconds. Then slowly slide your heel back to the starting position. Repeat 2 times. Complete this exercise 3 times a week. Exercise B: Quadriceps stretch, prone   1. Lie on your abdomen on a firm surface, such as a bed or padded floor. 2. Bend your left / right knee and hold your ankle. If you cannot reach your ankle or pant leg, loop a belt around your foot and grab the belt instead. 3. Gently pull your heel toward your buttocks. Your knee should not slide out to the side. You should feel a stretch in the front of your thigh and knee. 4. Hold this position for 30 seconds. Repeat 2 times. Complete this exercise 3 times a week. Strengthening exercises These exercises build strength and endurance in your thigh. Endurance is the ability to use your muscles for a long time, even after your muscles get tired. Exercise C: Straight leg raises  (quadriceps and hip flexors) Quality counts! Watch for signs that the quadriceps muscle is working to ensure that you are strengthening the correct muscles and not cheating by using healthier muscles. 1. Lie on your back with your left / right leg extended and your other knee bent. 2. Tense the muscles in the front of your left / right thigh. You should see your kneecap slide up or see increased dimpling just above the knee. 3. Tighten these muscles even more and raise your leg 4-6 inches (10-15 cm) off the floor. 4. Hold for 3 seconds. 5. Keep the thigh muscles tense as you lower your leg. 6. Relax the muscles slowly and completely after each repetition. Repeat 2 times. Complete this exercise 3 times a week. Exercise D: Straight leg raises (hip extensors) 1. Lie on your belly on a bed or a firm surface with a pillow under your hips. 2. Bend your left / right knee so your foot is straight up in the air. 3. Tense your buttock muscles and lift your left / right thigh off the bed. Do not let your back arch. 4. Hold this position for 3 seconds. 5. Slowly return to the starting position. Let your muscles relax completely before doing another repetition. Repeat 2 times. Complete this exercise 3 times a week. Exercise E: Wall sits   Follow the directions for form closely. If you do not place your feet and knees properly,  this can lead to knee pain. 1. Lean back against a smooth wall or door and walk your feet out 18-24 inches (46-61 cm) from it. Place your feet hip-width apart. 2. Slowly slide down the wall or door until your knees bend  60-90 degrees. Keep your weight back and over your heels, not over your toes. Keep your thighs straight or pointing slightly outward. 3. Hold for 1 second. 4. Use your thigh and buttock muscles to push you back up to a standing position. Keep your weight through your heels while you do this. 5. Rest for 5 seconds in between repetitions. Repeat 2 times. Complete  this exercise 3 times a week. Make sure you discuss any questions you have with your health care provider. Document Released: 07/09/2005 Document Revised: 03/15/2016 Document Reviewed: 04/12/2015 Elsevier Interactive Patient Education  2018 [employer].   Hip Exercises It is normal to feel mild stretching, pulling, tightness, or discomfort as you do these exercises, but you should stop right away if you feel sudden pain or your pain gets worse.  STRETCHING AND RANGE OF MOTION EXERCISES These exercises warm up your muscles and joints and improve the movement and flexibility of your hip. These exercises also help to relieve pain, numbness, and tingling. Exercise A: Hamstrings, Supine  1. Lie on your back. 2. Loop a belt or towel over the ball of your left / rightfoot. The ball of your foot is on the walking surface, right under your toes. 3. Straighten your left / rightknee and slowly pull on the belt to raise your leg. ? Do not let your left / right knee bend while you do this. ? Keep your other leg flat on the floor. ? Raise the left / right leg until you feel a gentle stretch behind your left / right knee or thigh. 4. Hold this position for 30 seconds. 5. Slowly return your leg to the starting position. Repeat2 times. Complete this stretch 3 times per week. Exercise B: Hip Rotators  1. Lie on your back on a firm surface. 2. Hold your left / right knee with your left / right hand. Hold your ankle with your other hand. 3. Gently pull your left / right knee and rotate your lower leg toward your other shoulder. ? Pull until you feel a stretch in your buttocks. ? Keep your hips and shoulders firmly planted while you do this stretch. 4. Hold this position for 30 seconds. Repeat 2 times. Complete this stretch 3 times per week. Exercise C: V-Sit (Hamstrings and Adductors)  1. Sit on the floor with your legs extended in a large "V" shape. Keep your knees straight during this  exercise. 2. Start with your head and chest upright, then bend at your waist to reach for your left foot (position A). You should feel a stretch in your right inner thigh. 3. Hold this position for 30 seconds. Then slowly return to the upright position. 4. Bend at your waist to reach forward (position B). You should feel a stretch behind both of your thighs and knees. 5. Hold this position for 30 seconds. Then slowly return to the upright position. 6. Bend at your waist to reach for your right foot (position C). You should feel a stretch in your left inner thigh. 7. Hold this position for 30 seconds. Then slowly return to the upright position. Repeat A, B, and C 2 times each. Complete this stretch 3 times per week. Exercise D: Lunge (Hip Flexors)  1. Place  your left / right knee on the floor and bend your other knee so that is directly over your ankle. You should be half-kneeling. 2. Keep good posture with your head over your shoulders. 3. Tighten your buttocks to point your tailbone downward. This helps your back to keep from arching too much. 4. You should feel a gentle stretch in the front of your left / right thigh and hip. If you do not feel any resistance, slightly slide your other foot forward and then slowly lunge forward so your knee once again lines up over your ankle. 5. Make sure your tailbone continues to point downward. 6. Hold this position for 30 seconds. Repeat 2 times. Complete this stretch 3 times per week.

## 2018-05-16 NOTE — Addendum Note (Signed)
Addended by: Sharon Seller B on: 05/16/2018 09:47 AM   Modules accepted: Orders

## 2018-05-16 NOTE — Progress Notes (Signed)
Pre visit review using our clinic review tool, if applicable. No additional management support is needed unless otherwise documented below in the visit note. 

## 2018-05-16 NOTE — Progress Notes (Signed)
Musculoskeletal Exam  Patient: Katelyn Fisher DOB: 04-Mar-1951  DOS: 05/16/2018  SUBJECTIVE:  Chief Complaint:   Chief Complaint  Patient presents with  . Leg Pain    left    Katelyn Fisher is a 67 y.o.  female for evaluation and treatment of L thigh pain.   Onset:  1 month ago. No inj or change in activity.  Location: Ant L thigh Character:  aching  Progression of issue:  has worsened slightly Associated symptoms: none; does have hx of back issues and wonders if it could be related to that Treatment: to date has been mustard (that has been helpful), Tylenol. Neurovascular symptoms: no  ROS: Musculoskeletal/Extremities: +R thigh pain  Past Medical History:  Diagnosis Date  . Anxiety   . Arthritis    back  . Asthma   . Benign paroxysmal positional vertigo 11/01/2012  . Cancer ([place]) 12/22/2014   endometrial  . Depression   . Endometrial cancer ([place]) 2015  . Family history of breast cancer   . Family history of colon cancer   . Family history of ovarian cancer   . Family history of prostate cancer   . GERD (gastroesophageal reflux disease)   . History of colonic diverticulitis   . Hx of colonic polyps   . Hyperlipidemia   . Hypertension   . 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  . Low back pain radiating to right leg 08/18/2012  . Obesity   . Osteopenia 09/2017   T score -1.2 FRAX 3.2% / 0.3%  . Personal history of chemotherapy 2015  . Radiation 12/17/14-01/03/15   vaginal brachytherapy  . Thyroid disease 06/01/2013    Objective: VITAL SIGNS: BP 132/88 (BP Location: Left Arm, Patient Position: Sitting, Cuff Size: Large)   Pulse 80   Temp 97.8 F (36.6 C) (Oral)   Ht 5\' 2"  (1.575 m)   Wt 234 lb (106.1 kg)   SpO2 97%   BMI 42.80 kg/m  Constitutional: Well formed, well developed. No acute distress. Thorax & Lungs: No accessory muscle use Musculoskeletal: R thigh.   Normal active range of motion: yes.   Normal passive range of motion: yes Tenderness to  palpation: yes, over ant thigh Deformity: no Ecchymosis: no +ttp with resisted hip flexion and knee extension Neurologic: Normal sensory function. No focal deficits noted.  Psychiatric: Normal mood. Age appropriate judgment and insight. Alert & oriented x 3.    Assessment:  Quadriceps strain, right, initial encounter  Plan: Tylenol. Heat, ice, stretches/exercises. Let us know if not turning corner after 3-4 weeks. F/u prn. The patient voiced understanding and agreement to the plan.   [place], DO 05/16/18  9:18 AM

## 2018-05-19 ENCOUNTER — Other Ambulatory Visit: Payer: Self-pay | Admitting: Family Medicine

## 2018-05-19 DIAGNOSIS — I10 Essential (primary) hypertension: Secondary | ICD-10-CM

## 2018-06-24 ENCOUNTER — Telehealth: Payer: Self-pay | Admitting: *Deleted

## 2018-06-24 NOTE — Telephone Encounter (Signed)
Patient called and scheduled her follow up appt for March

## 2018-06-26 ENCOUNTER — Ambulatory Visit: Payer: Medicare PPO

## 2018-06-30 ENCOUNTER — Encounter: Payer: Self-pay | Admitting: Family Medicine

## 2018-06-30 ENCOUNTER — Ambulatory Visit (INDEPENDENT_AMBULATORY_CARE_PROVIDER_SITE_OTHER): Payer: Medicare PPO | Admitting: Family Medicine

## 2018-06-30 VITALS — BP 120/80 | HR 102 | Temp 99.1°F | Resp 18

## 2018-06-30 DIAGNOSIS — J208 Acute bronchitis due to other specified organisms: Secondary | ICD-10-CM

## 2018-06-30 DIAGNOSIS — Z8542 Personal history of malignant neoplasm of other parts of uterus: Secondary | ICD-10-CM

## 2018-06-30 DIAGNOSIS — J309 Allergic rhinitis, unspecified: Secondary | ICD-10-CM | POA: Insufficient documentation

## 2018-06-30 DIAGNOSIS — J4541 Moderate persistent asthma with (acute) exacerbation: Secondary | ICD-10-CM

## 2018-06-30 DIAGNOSIS — Z79899 Other long term (current) drug therapy: Secondary | ICD-10-CM

## 2018-06-30 DIAGNOSIS — I10 Essential (primary) hypertension: Secondary | ICD-10-CM

## 2018-06-30 DIAGNOSIS — E1140 Type 2 diabetes mellitus with diabetic neuropathy, unspecified: Secondary | ICD-10-CM | POA: Diagnosis not present

## 2018-06-30 DIAGNOSIS — T7800XD Anaphylactic reaction due to unspecified food, subsequent encounter: Secondary | ICD-10-CM

## 2018-06-30 DIAGNOSIS — J3089 Other allergic rhinitis: Secondary | ICD-10-CM | POA: Insufficient documentation

## 2018-06-30 HISTORY — DX: Other long term (current) drug therapy: Z79.899

## 2018-06-30 HISTORY — DX: Other allergic rhinitis: J30.89

## 2018-06-30 HISTORY — DX: Acute bronchitis due to other specified organisms: J20.8

## 2018-06-30 MED ORDER — BENZONATATE 100 MG PO CAPS: 100.0000 mg | ORAL_CAPSULE | Freq: Three times a day (TID) | ORAL | 0 refills | Status: DC | PRN

## 2018-06-30 MED ORDER — AZITHROMYCIN 250 MG PO TABS: ORAL_TABLET | ORAL | 0 refills | Status: DC

## 2018-06-30 MED ORDER — ALBUTEROL SULFATE (2.5 MG/3ML) 0.083% IN NEBU: 2.5000 mg | INHALATION_SOLUTION | RESPIRATORY_TRACT | 2 refills | Status: DC | PRN

## 2018-06-30 NOTE — Patient Instructions (Addendum)
Zyrtec 10 mg-take 1 tablet once a day if needed for runny nose or itchy eyes Flonase 1-2 sprays in each nostril once a day as needed for a stuffy nose Consider nasal rinses once a day as needed to reduce nasal symptoms Pro-air 2 puffs every 4 hours if needed for wheezing or coughing spells Prednisone 10 mg tablets. Take 2 tablets twice a day for the first 3 days, then take 2 tablets for one day, then take 1 tablet on the 5th day, then stop Azithromycin 250 mg tablets. Take 2 tablets on the first day, then take 1 tablet for the next 4 days, then stop Tesaoln Perles 100 mg capsules. Take 1 capsule up to three times a day as needed for cough  Continue avoiding shellfish.  If you have an allergic reaction take Benadryl 50 mg every 4 hours and if you have life-threatening symptoms inject with EpiPen 0.3 mg Continue on your other medications as listed in the chart  Call us if you are not doing well on this treatment plan  Follow up in 2 months or sooner if needed

## 2018-06-30 NOTE — Progress Notes (Signed)
[address] [place] [id number] Dept: [phone]  FOLLOW UP NOTE  Patient ID: Katelyn Fisher, female    DOB: 08-23-1950  Age: 67 y.o. MRN: [id number] Date of Office Visit: 06/30/2018  Assessment  Chief Complaint: Wheezing (coughing,low grade fever, since thursday)  HPI Katelyn Fisher is a 67 year old female who presents to the clinic for a sick visit.  She reports that on Thursday, 5 days ago, she began experiencing symptoms including productive cough with yellow mucus, wheeze, low-grade fever (99.2 T-max), and headache over her forehead.  Her asthma is reported as not well controlled with with symptoms including shortness of breath, wheeze occurring day and night, and  Non-productive cough.  She reports using her albuterol inhaler every 6 hours for the last 4 to 5 days with short-term improvement.  She is not currently using Symbicort.  Allergic rhinitis is reported as moderately well controlled Flonase as needed.  Her medications are listed in the chart. Her endometrial cancer remains in remission at this time.    Drug Allergies:  Allergies  Allergen Reactions  . Shellfish Allergy Hives, Shortness Of Breath and Swelling  . Oxycodone Swelling    Facial swelling and tightness  . Penicillins Swelling    Rash with welps  . Iodinated Diagnostic Agents Rash  . Sulfa Antibiotics Rash    Physical Exam: BP 120/80   Pulse (!) 102   Temp 99.1 F (37.3 C) (Oral)   Resp 18   SpO2 96%    Physical Exam  Constitutional: She is oriented to person, place, and time. She appears well-developed and well-nourished.  HENT:  Head: Normocephalic.  Right Ear: External ear normal.  Left Ear: External ear normal.  Mouth/Throat: Oropharynx is clear and moist.  Bilateral nares slightly erythematous and edematous with clear nasal drainage noted.  Pharynx normal.  Ears normal.  Eyes normal  Eyes: Conjunctivae are normal.  Neck: Normal range of motion.  Cardiovascular: Normal rate,  regular rhythm and normal heart sounds.  No murmur noted  Pulmonary/Chest: Effort normal.  Bilateral scattered expiratory wheeze which improved post bronchodilator therapy  Musculoskeletal: Normal range of motion.  Neurological: She is alert and oriented to person, place, and time.  Skin: Skin is warm and dry.  Psychiatric: She has a normal mood and affect. Her behavior is normal. Judgment and thought content normal.  Vitals reviewed.   Diagnostics: FVC 1.26, FEV1 1.03.  Predicted FVC 2.25, predicted FEV1 1.75.  Spirometry reveals moderate restriction.  Postbronchodilator therapy FVC 1.29, FEV1 1.03.  Postbronchodilator spirometry reveals moderate restriction with no significant bronchodilator response.  Assessment and Plan: 1. Moderate persistent asthma with acute exacerbation   2. Allergic rhinitis, unspecified seasonality, unspecified trigger   3. Type 2 diabetes mellitus with diabetic neuropathy, without long-term current use of insulin ([place])   4. Current use of beta blocker   5. Anaphylactic shock due to food, subsequent encounter   6. Essential hypertension   7. History of endometrial cancer   8. Acute bronchitis due to other specified organisms     Meds ordered this encounter  Medications  . azithromycin (ZITHROMAX) 250 MG tablet    Sig: Take 2 tablets on day 1 then 1 tablet each day for 4 days    Dispense:  6 each    Refill:  0  . benzonatate (TESSALON PERLES) 100 MG capsule    Sig: Take 1 capsule (100 mg total) by mouth 3 (three) times daily as needed for cough.    Dispense:  20 capsule    Refill:  0  . albuterol (PROVENTIL) (2.5 MG/3ML) 0.083% nebulizer solution    Sig: Take 3 mLs (2.5 mg total) by nebulization every 4 (four) hours as needed for wheezing or shortness of breath.    Dispense:  150 mL    Refill:  2    Patient Instructions  Zyrtec 10 mg-take 1 tablet once a day if needed for runny nose or itchy eyes Flonase 1-2 sprays in each nostril once a day as  needed for a stuffy nose Consider nasal rinses once a day as needed to reduce nasal symptoms Pro-air 2 puffs every 4 hours if needed for wheezing or coughing spells Prednisone 10 mg tablets. Take 2 tablets twice a day for the first 3 days, then take 2 tablets for one day, then take 1 tablet on the 5th day, then stop Azithromycin 250 mg tablets. Take 2 tablets on the first day, then take 1 tablet for the next 4 days, then stop Tesaoln Perles 100 mg capsules. Take 1 capsule up to three times a day as needed for cough  Continue avoiding shellfish.  If you have an allergic reaction take Benadryl 50 mg every 4 hours and if you have life-threatening symptoms inject with EpiPen 0.3 mg Continue on your other medications as listed in the chart  Call us if you are not doing well on this treatment plan  Follow up in 2 months or sooner if needed   Return in about 2 months (around 08/31/2018), or if symptoms worsen or fail to improve.   Thank you for the opportunity to care for this patient.  Please do not hesitate to contact me with questions.  Gareth Morgan, FNP [facility]  I have provided oversight concerning Gareth Morgan' evaluation and treatment of this patient's health issues addressed during today's encounter. I agree with the assessment and therapeutic plan as outlined in the note.   Thank you for the opportunity to care for this patient.  Please do not hesitate to contact me with questions.  Penne Lash, M.D.  [facility] [address] [place], [place] [address] [phone]

## 2018-07-03 ENCOUNTER — Telehealth: Payer: Self-pay | Admitting: Family Medicine

## 2018-07-03 ENCOUNTER — Ambulatory Visit (HOSPITAL_BASED_OUTPATIENT_CLINIC_OR_DEPARTMENT_OTHER)
Admission: RE | Admit: 2018-07-03 | Discharge: 2018-07-03 | Disposition: A | Discharge status: Home / Self Care | Payer: Medicare PPO | Source: Ambulatory Visit | Attending: Family Medicine | Admitting: Family Medicine

## 2018-07-03 ENCOUNTER — Other Ambulatory Visit: Payer: Self-pay

## 2018-07-03 DIAGNOSIS — R059 Cough, unspecified: Secondary | ICD-10-CM

## 2018-07-03 DIAGNOSIS — R05 Cough: Secondary | ICD-10-CM | POA: Diagnosis not present

## 2018-07-03 MED ORDER — DOXYCYCLINE HYCLATE 100 MG PO CAPS: 100.0000 mg | ORAL_CAPSULE | Freq: Two times a day (BID) | ORAL | 0 refills | Status: AC

## 2018-07-03 MED ORDER — CEFDINIR 300 MG PO CAPS: 300.0000 mg | ORAL_CAPSULE | Freq: Two times a day (BID) | ORAL | 0 refills | Status: AC

## 2018-07-03 NOTE — Telephone Encounter (Signed)
Patient informed of chest xray results and will pick up 2 antibiotics at the pharmacy to take at the same time for double coverage. She reports she continues to cough with yellow sputum production with wheezing and temp of 101.4 this morning. She reports she continues Symbicort 160-2 puffs twice a day with a spacer, Protonix, and albuterol inhaler as needed. She failed azithromycin therapy and has a documented allergy to penicillin. We will move forward with a combination of Cefdinir PLUS Doxycycline as therapy directed toward the pneumonia. She will continue with Symbicort 160-2 puffs twice a day, albuterol as needed, and Protonix. She will increase fluids as tolerated. She will call if her symptoms worsen or so not improve. She has verbalized understanding and agreement of these instructions. She is aware that there is a slight risk of allergic reaction and she will stop taking the medications and call the clinic with any allergic reaction.

## 2018-07-03 NOTE — Progress Notes (Signed)
Patient notified of result and abx started. See telephone contact for same day

## 2018-07-03 NOTE — Telephone Encounter (Signed)
Thank you :)

## 2018-07-03 NOTE — Telephone Encounter (Signed)
Patient reports no improvement in her symptoms. She reports a new temp of 101.4 this morning as well as cough with yellow sputum and wheeze. She agrees to CXR at [facility]. I will call her with results. She understands and verbalized understanding.

## 2018-07-03 NOTE — Telephone Encounter (Signed)
Katelyn Garret,  Ms. Fedor,  Called this morning and said that when she saw you on Monday to call back if she was not feeling better.    Well on Tuesday she started running a low grade fever and is still wheezing and coughing.  She would like for you to give her a call back.  Thank you, Andee Poles

## 2018-07-04 ENCOUNTER — Other Ambulatory Visit: Payer: Self-pay

## 2018-07-04 DIAGNOSIS — R05 Cough: Secondary | ICD-10-CM

## 2018-07-04 DIAGNOSIS — R059 Cough, unspecified: Secondary | ICD-10-CM

## 2018-07-07 ENCOUNTER — Telehealth: Payer: Self-pay

## 2018-07-07 NOTE — Telephone Encounter (Signed)
Left pt. A message to foo

## 2018-07-07 NOTE — Telephone Encounter (Signed)
Can you please call this patient and ask if her symptoms are improving? Thank you  (Per Lelon Frohlich)  Called pt and she stated her symptoms are improving.

## 2018-07-07 NOTE — Telephone Encounter (Signed)
-----   Message from Dara Hoyer, FNP sent at 07/07/2018 11:32 AM EST ----- Can you please call this patient and ask if her symptoms are improving? Thank you

## 2018-07-07 NOTE — Telephone Encounter (Signed)
Pt. Calls back and states she is doing much better, but now just coughing up clear to yellow sputum. Breathing is good per pt. She will run a low grade fever 100.1 but not often. Pt. Is working on taking in more fluids. Pt. Will go to the beach to see family and just rest. Pt. Has a repeat chest xray on January 13th and follow up visit with Gareth Morgan, FNP on Thursaday January 16th at 11:00.

## 2018-07-07 NOTE — Telephone Encounter (Signed)
Left message on pt.'s vm to let her know that Gareth Morgan, FNP wanted [place] to call to see how she was doing.

## 2018-07-08 NOTE — Telephone Encounter (Signed)
Completed. Refer to [facility]

## 2018-07-30 ENCOUNTER — Ambulatory Visit (INDEPENDENT_AMBULATORY_CARE_PROVIDER_SITE_OTHER): Payer: Medicare PPO | Admitting: Family Medicine

## 2018-07-30 ENCOUNTER — Encounter: Payer: Self-pay | Admitting: Family Medicine

## 2018-07-30 VITALS — BP 118/80 | HR 103 | Temp 98.5°F | Ht 62.0 in | Wt 223.2 lb

## 2018-07-30 DIAGNOSIS — M545 Low back pain, unspecified: Secondary | ICD-10-CM

## 2018-07-30 DIAGNOSIS — S46812A Strain of other muscles, fascia and tendons at shoulder and upper arm level, left arm, initial encounter: Secondary | ICD-10-CM

## 2018-07-30 MED ORDER — TRAMADOL HCL 50 MG PO TABS: 50.0000 mg | ORAL_TABLET | Freq: Two times a day (BID) | ORAL | 0 refills | Status: DC | PRN

## 2018-07-30 MED FILL — traMADol HCL 50 MG TABS: 50 | 6 days supply | Qty: 12 | Fill #0

## 2018-07-30 NOTE — Progress Notes (Signed)
Pre visit review using our clinic review tool, if applicable. No additional management support is needed unless otherwise documented below in the visit note. 

## 2018-07-30 NOTE — Progress Notes (Signed)
Musculoskeletal Exam  Patient: Katelyn Fisher DOB: 12/01/1950  DOS: 07/30/2018  SUBJECTIVE:  Chief Complaint:   Chief Complaint  Patient presents with  . Arm Pain    left    Katelyn Fisher is a 68 y.o.  female for evaluation and treatment of L arm pain.   Onset:  1 week ago. No inj or change in activity.  Location: shoulder over top Character:  aching  Progression of issue:  has worsened Associated symptoms: some creased ROM; no bruising or swelling Treatment: to date has been acetaminophen.   Neurovascular symptoms: no  ROS: Musculoskeletal/Extremities: +arm pain  Past Medical History:  Diagnosis Date  . Anxiety   . Arthritis    back  . Asthma   . Benign paroxysmal positional vertigo 11/01/2012  . Cancer ([place]) 12/22/2014   endometrial  . Depression   . Endometrial cancer ([place]) 2015  . Family history of breast cancer   . Family history of colon cancer   . Family history of ovarian cancer   . Family history of prostate cancer   . GERD (gastroesophageal reflux disease)   . History of colonic diverticulitis   . Hx of colonic polyps   . Hyperlipidemia   . Hypertension   . 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  . Low back pain radiating to right leg 08/18/2012  . Obesity   . Osteopenia 09/2017   T score -1.2 FRAX 3.2% / 0.3%  . Personal history of chemotherapy 2015  . Radiation 12/17/14-01/03/15   vaginal brachytherapy  . Thyroid disease 06/01/2013    Objective: VITAL SIGNS: BP 118/80 (BP Location: Left Arm, Patient Position: Sitting, Cuff Size: Large)   Pulse (!) 103   Temp 98.5 F (36.9 C) (Oral)   Ht 5\' 2"  (1.575 m)   Wt 223 lb 4 oz (101.3 kg)   SpO2 96%   BMI 40.83 kg/m  Constitutional: Well formed, well developed. No acute distress. Cardiovascular: Brisk cap refill Thorax & Lungs: No accessory muscle use Musculoskeletal: L arm.   Normal active range of motion: no.   Normal passive range of motion: yes Tenderness to palpation: yes; over L  trap Deformity: no Ecchymosis: no Tests positive: Neer's, [place], Empty can Tests negative: LIft off, cross over, speed's, [place]'s Neurologic: Normal sensory function. No focal deficits noted. Psychiatric: Normal mood. Age appropriate judgment and insight. Alert & oriented x 3.    Assessment:  Trapezius strain, left, initial encounter  Acute right-sided low back pain without sciatica - Plan: traMADol (ULTRAM) 50 MG tablet  Plan: Orders as above. Minor supraspinatus, trap bothersome. Tx trap, stretches/exercises, heat, ice, Tylenol. Tramadol for breakthrough. She has done well on this in the past.  F/u prn in 3-4 weeks. The patient voiced understanding and agreement to the plan.   [place], DO 07/30/18  1:28 PM

## 2018-07-30 NOTE — Patient Instructions (Addendum)
Heat (pad or rice pillow in microwave) over affected area, 10-15 minutes twice daily.   Ice/cold pack over area for 10-15 min twice daily.  OK to take Tylenol 1000 mg (2 extra strength tabs) or 975 mg (3 regular strength tabs) every 6 hours as needed.  If this does not help over the next 3-4 weeks, return and we can consider injections.   Trapezius stretches/exercises Do exercises exactly as told by your health care provider and adjust them as directed. It is normal to feel mild stretching, pulling, tightness, or discomfort as you do these exercises, but you should stop right away if you feel sudden pain or your pain gets worse.  Stretching and range of motion exercises These exercises warm up your muscles and joints and improve the movement and flexibility of your shoulder. These exercises can also help to relieve pain, numbness, and tingling. If you are unable to do any of the following for any reason, do not further attempt to do it.   Exercise A: Flexion, standing    1. Stand and hold a broomstick, a cane, or a similar object. Place your hands a little more than shoulder-width apart on the object. Your left / right hand should be palm-up, and your other hand should be palm-down. 2. Push the stick to raise your left / right arm out to your side and then over your head. Use your other hand to help move the stick. Stop when you feel a stretch in your shoulder, or when you reach the angle that is recommended by your health care provider. ? Avoid shrugging your shoulder while you raise your arm. Keep your shoulder blade tucked down toward your spine. 3. Hold for 30 seconds. 4. Slowly return to the starting position. Repeat 2 times. Complete this exercise 3 times per week.  Exercise B: Abduction, supine    1. Lie on your back and hold a broomstick, a cane, or a similar object. Place your hands a little more than shoulder-width apart on the object. Your left / right hand should be palm-up,  and your other hand should be palm-down. 2. Push the stick to raise your left / right arm out to your side and then over your head. Use your other hand to help move the stick. Stop when you feel a stretch in your shoulder, or when you reach the angle that is recommended by your health care provider. ? Avoid shrugging your shoulder while you raise your arm. Keep your shoulder blade tucked down toward your spine. 3. Hold for 30 seconds. 4. Slowly return to the starting position. Repeat 2 times. Complete this exercise 3 times per week.  Exercise C: Flexion, active-assisted    1. Lie on your back. You may bend your knees for comfort. 2. Hold a broomstick, a cane, or a similar object. Place your hands about shoulder-width apart on the object. Your palms should face toward your feet. 3. Raise the stick and move your arms over your head and behind your head, toward the floor. Use your healthy arm to help your left / right arm move farther. Stop when you feel a gentle stretch in your shoulder, or when you reach the angle where your health care provider tells you to stop. 4. Hold for 30 seconds. 5. Slowly return to the starting position. Repeat 2 times. Complete this exercise 3 times per week.  Exercise D: External rotation and abduction    1. Stand in a door frame with one of your  feet slightly in front of the other. This is called a staggered stance. 2. Choose one of the following positions as told by your health care provider: ? Place your hands and forearms on the door frame above your head. ? Place your hands and forearms on the door frame at the height of your head. ? Place your hands on the door frame at the height of your elbows. 3. Slowly move your weight onto your front foot until you feel a stretch across your chest and in the front of your shoulders. Keep your head and chest upright and keep your abdominal muscles tight. 4. Hold for 30 seconds. 5. To release the stretch, shift your  weight to your back foot. Repeat 2 times. Complete this stretch 3 times per week.  Strengthening exercises These exercises build strength and endurance in your shoulder. Endurance is the ability to use your muscles for a long time, even after your muscles get tired. Exercise E: Scapular depression and adduction  1. Sit on a stable chair. Support your arms in front of you with pillows, armrests, or a tabletop. Keep your elbows in line with the sides of your body. 2. Gently move your shoulder blades down toward your middle back. Relax the muscles on the tops of your shoulders and in the back of your neck. 3. Hold for 3 seconds. 4. Slowly release the tension and relax your muscles completely before doing this exercise again. Repeat for a total of 10 repetitions. 5. After you have practiced this exercise, try doing the exercise without the arm support. Then, try the exercise while standing instead of sitting. Repeat 2 times. Complete this exercise 3 times per week.  Exercise F: Shoulder abduction, isometric    1. Stand or sit about 4-6 inches (10-15 cm) from a wall with your left / right side facing the wall. 2. Bend your left / right elbow and gently press your elbow against the wall. 3. Increase the pressure slowly until you are pressing as hard as you can without shrugging your shoulder. 4. Hold for 3 seconds. 5. Slowly release the tension and relax your muscles completely. Repeat for a total of 10 repetitions. Repeat 2 times. Complete this exercise 3 times per week.  Exercise G: Shoulder flexion, isometric    1. Stand or sit about 4-6 inches (10-15 cm) away from a wall with your left / right side facing the wall. 2. Keep your left / right elbow straight and gently press the top of your fist against the wall. Increase the pressure slowly until you are pressing as hard as you can without shrugging your shoulder. 3. Hold for 10-15 seconds. 4. Slowly release the tension and relax your  muscles completely. Repeat for a total of 10 repetitions. Repeat 2 times. Complete this exercise 3 times per week.  Exercise H: Internal rotation    1. Sit in a stable chair without armrests, or stand. Secure an exercise band at your left / right side, at elbow height. 2. Place a soft object, such as a folded towel or a small pillow, under your left / right upper arm so your elbow is a few inches (about 8 cm) away from your side. 3. Hold the end of the exercise band so the band stretches. 4. Keeping your elbow pressed against the soft object under your arm, move your forearm across your body toward your abdomen. Keep your body steady so the movement is only coming from your shoulder. 5. Hold for 3  seconds. 6. Slowly return to the starting position. Repeat for a total of 10 repetitions. Repeat 2 times. Complete this exercise 3 times per week.  Exercise I: External rotation    1. Sit in a stable chair without armrests, or stand. 2. Secure an exercise band at your left / right side, at elbow height. 3. Place a soft object, such as a folded towel or a small pillow, under your left / right upper arm so your elbow is a few inches (about 8 cm) away from your side. 4. Hold the end of the exercise band so the band stretches. 5. Keeping your elbow pressed against the soft object under your arm, move your forearm out, away from your abdomen. Keep your body steady so the movement is only coming from your shoulder. 6. Hold for 3 seconds. 7. Slowly return to the starting position. Repeat for a total of 10 repetitions. Repeat 2 times. Complete this exercise 3 times per week. Exercise J: Shoulder extension  1. Sit in a stable chair without armrests, or stand. Secure an exercise band to a stable object in front of you so the band is at shoulder height. 2. Hold one end of the exercise band in each hand. Your palms should face each other. 3. Straighten your elbows and lift your hands up to shoulder  height. 4. Step back, away from the secured end of the exercise band, until the band stretches. 5. Squeeze your shoulder blades together and pull your hands down to the sides of your thighs. Stop when your hands are straight down by your sides. Do not let your hands go behind your body. 6. Hold for 3 seconds. 7. Slowly return to the starting position. Repeat for a total of 10 repetitions. Repeat 2 times. Complete this exercise 3 times per week.  Exercise K: Shoulder extension, prone    1. Lie on your abdomen on a firm surface so your left / right arm hangs over the edge. 2. Hold a 5 lb weight in your hand so your palm faces in toward your body. Your arm should be straight. 3. Squeeze your shoulder blade down toward the middle of your back. 4. Slowly raise your arm behind you, up to the height of the surface that you are lying on. Keep your arm straight. 5. Hold for 3 seconds. 6. Slowly return to the starting position and relax your muscles. Repeat for a total of 10 repetitions. Repeat 2 times. Complete this exercise 3 times per week.   Exercise L: Horizontal abduction, prone  1. Lie on your abdomen on a firm surface so your left / right arm hangs over the edge. 2. Hold a 5 lb weight in your hand so your palm faces toward your feet. Your arm should be straight. 3. Squeeze your shoulder blade down toward the middle of your back. 4. Bend your elbow so your hand moves up, until your elbow is bent to an "L" shape (90 degrees). With your elbow bent, slowly move your forearm forward and up. Raise your hand up to the height of the surface that you are lying on. ? Your upper arm should not move, and your elbow should stay bent. ? At the top of the movement, your palm should face the floor. 5. Hold for 3 seconds. 6. Slowly return to the starting position and relax your muscles. Repeat for a total of 10 repetitions. Repeat 2 times. Complete this exercise 3 times per week.  Exercise M: Horizontal  abduction,  standing  1. Sit on a stable chair, or stand. 2. Secure an exercise band to a stable object in front of you so the band is at shoulder height. 3. Hold one end of the exercise band in each hand. 4. Straighten your elbows and lift your hands straight in front of you, up to shoulder height. Your palms should face down, toward the floor. 5. Step back, away from the secured end of the exercise band, until the band stretches. 6. Move your arms out to your sides, and keep your arms straight. 7. Hold for 3 seconds. 8. Slowly return to the starting position. Repeat for a total of 10 repetitions. Repeat 2 times. Complete this exercise 3 times per week.  Exercise N: Scapular retraction and elevation  1. Sit on a stable chair, or stand. 2. Secure an exercise band to a stable object in front of you so the band is at shoulder height. 3. Hold one end of the exercise band in each hand. Your palms should face each other. 4. Sit in a stable chair without armrests, or stand. 5. Step back, away from the secured end of the exercise band, until the band stretches. 6. Squeeze your shoulder blades together and lift your hands over your head. Keep your elbows straight. 7. Hold for 3 seconds. 8. Slowly return to the starting position. Repeat for a total of 10 repetitions. Repeat 2 times. Complete this exercise 3 times per week.  This information is not intended to replace advice given to you by your health care provider. Make sure you discuss any questions you have with your health care provider. Document Released: 07/09/2005 Document Revised: 03/15/2016 Document Reviewed: 05/26/2015 Elsevier Interactive Patient Education  2017 [employer] (ROM) AND STRETCHING EXERCISES These exercises may help you when beginning to rehabilitate your injury. While completing these exercises, remember:   Restoring tissue flexibility helps normal motion to return to the joints. This allows  healthier, less painful movement and activity.  An effective stretch should be held for at least 30 seconds.  A stretch should never be painful. You should only feel a gentle lengthening or release in the stretched tissue.  ROM - Pendulum  Bend at the waist so that your right / left arm falls away from your body. Support yourself with your opposite hand on a solid surface, such as a table or a countertop.  Your right / left arm should be perpendicular to the ground. If it is not perpendicular, you need to lean over farther. Relax the muscles in your right / left arm and shoulder as much as possible.  Gently sway your hips and trunk so they move your right / left arm without any use of your right / left shoulder muscles.  Progress your movements so that your right / left arm moves side to side, then forward and backward, and finally, both clockwise and counterclockwise.  Complete 10-15 repetitions in each direction. Many people use this exercise to relieve discomfort in their shoulder as well as to gain range of motion. Repeat 2 times. Complete this exercise 3 times per week.  STRETCH - Flexion, Standing  Stand with good posture. With an underhand grip on your right / left hand and an overhand grip on the opposite hand, grasp a broomstick or cane so that your hands are a little more than shoulder-width apart.  Keeping your right / left elbow straight and shoulder muscles relaxed, push the stick with your opposite  hand to raise your right / left arm in front of your body and then overhead. Raise your arm until you feel a stretch in your right / left shoulder, but before you have increased shoulder pain.  Try to avoid shrugging your right / left shoulder as your arm rises by keeping your shoulder blade tucked down and toward your mid-back spine. Hold 30 seconds.  Slowly return to the starting position. Repeat 2 times. Complete this exercise 3 times per week.  STRETCH - Internal  Rotation  Place your right / left hand behind your back, palm-up.  Throw a towel or belt over your opposite shoulder. Grasp the towel/belt with your right / left hand.  While keeping an upright posture, gently pull up on the towel/belt until you feel a stretch in the front of your right / left shoulder.  Avoid shrugging your right / left shoulder as your arm rises by keeping your shoulder blade tucked down and toward your mid-back spine.  Hold 30. Release the stretch by lowering your opposite hand. Repeat 2 times. Complete this exercise 3 times per week.  STRETCH - External Rotation and Abduction  Stagger your stance through a doorframe. It does not matter which foot is forward.  As instructed by your physician, physical therapist or athletic trainer, place your hands: ? And forearms above your head and on the door frame. ? And forearms at head-height and on the door frame. ? At elbow-height and on the door frame.  Keeping your head and chest upright and your stomach muscles tight to prevent over-extending your low-back, slowly shift your weight onto your front foot until you feel a stretch across your chest and/or in the front of your shoulders.  Hold 30 seconds. Shift your weight to your back foot to release the stretch. Repeat 2 times. Complete this stretch 3 times per week.   STRENGTHENING EXERCISES  These exercises may help you when beginning to rehabilitate your injury. They may resolve your symptoms with or without further involvement from your physician, physical therapist or athletic trainer. While completing these exercises, remember:   Muscles can gain both the endurance and the strength needed for everyday activities through controlled exercises.  Complete these exercises as instructed by your physician, physical therapist or athletic trainer. Progress the resistance and repetitions only as guided.  You may experience muscle soreness or fatigue, but the pain or  discomfort you are trying to eliminate should never worsen during these exercises. If this pain does worsen, stop and make certain you are following the directions exactly. If the pain is still present after adjustments, discontinue the exercise until you can discuss the trouble with your clinician.  If advised by your physician, during your recovery, avoid activity or exercises which involve actions that place your right / left hand or elbow above your head or behind your back or head. These positions stress the tissues which are trying to heal.  STRENGTH - Scapular Depression and Adduction  With good posture, sit on a firm chair. Supported your arms in front of you with pillows, arm rests or a table top. Have your elbows in line with the sides of your body.  Gently draw your shoulder blades down and toward your mid-back spine. Gradually increase the tension without tensing the muscles along the top of your shoulders and the back of your neck.  Hold for 3 seconds. Slowly release the tension and relax your muscles completely before completing the next repetition.  After you have  practiced this exercise, remove the arm support and complete it in standing as well as sitting. Repeat 2 times. Complete this exercise 3 times per week.   STRENGTH - External Rotators  Secure a rubber exercise band/tubing to a fixed object so that it is at the same height as your right / left elbow when you are standing or sitting on a firm surface.  Stand or sit so that the secured exercise band/tubing is at your side that is not injured.  Bend your elbow 90 degrees. Place a folded towel or small pillow under your right / left arm so that your elbow is a few inches away from your side.  Keeping the tension on the exercise band/tubing, pull it away from your body, as if pivoting on your elbow. Be sure to keep your body steady so that the movement is only coming from your shoulder rotating.  Hold 3 seconds. Release  the tension in a controlled manner as you return to the starting position. Repeat 2 times. Complete this exercise 3 times per week.   STRENGTH - Supraspinatus  Stand or sit with good posture. Grasp a 2-3 lb weight or an exercise band/tubing so that your hand is "thumbs-up," like when you shake hands.  Slowly lift your right / left hand from your thigh into the air, traveling about 30 degrees from straight out at your side. Lift your hand to shoulder height or as far as you can without increasing any shoulder pain. Initially, many people do not lift their hands above shoulder height.  Avoid shrugging your right / left shoulder as your arm rises by keeping your shoulder blade tucked down and toward your mid-back spine.  Hold for 3 seconds. Control the descent of your hand as you slowly return to your starting position. Repeat 2 times. Complete this exercise 3 times per week.   STRENGTH - Shoulder Extensors  Secure a rubber exercise band/tubing so that it is at the height of your shoulders when you are either standing or sitting on a firm arm-less chair.  With a thumbs-up grip, grasp an end of the band/tubing in each hand. Straighten your elbows and lift your hands straight in front of you at shoulder height. Step back away from the secured end of band/tubing until it becomes tense.  Squeezing your shoulder blades together, pull your hands down to the sides of your thighs. Do not allow your hands to go behind you.  Hold for 3 seconds. Slowly ease the tension on the band/tubing as you reverse the directions and return to the starting position. Repeat 2 times. Complete this exercise 3 times per week.   STRENGTH - Scapular Retractors  Secure a rubber exercise band/tubing so that it is at the height of your shoulders when you are either standing or sitting on a firm arm-less chair.  With a palm-down grip, grasp an end of the band/tubing in each hand. Straighten your elbows and lift your hands  straight in front of you at shoulder height. Step back away from the secured end of band/tubing until it becomes tense.  Squeezing your shoulder blades together, draw your elbows back as you bend them. Keep your upper arm lifted away from your body throughout the exercise.  Hold 3 seconds. Slowly ease the tension on the band/tubing as you reverse the directions and return to the starting position. Repeat 2 times. Complete this exercise 3 times per week.  STRENGTH - Scapular Depressors  Find a sturdy chair without wheels,  such as a from a dining room table.  Keeping your feet on the floor, lift your bottom from the seat and lock your elbows.  Keeping your elbows straight, allow gravity to pull your body weight down. Your shoulders will rise toward your ears.  Raise your body against gravity by drawing your shoulder blades down your back, shortening the distance between your shoulders and ears. Although your feet should always maintain contact with the floor, your feet should progressively support less body weight as you get stronger.  Hold 3 seconds. In a controlled and slow manner, lower your body weight to begin the next repetition. Repeat 2 times. Complete this exercise 3 times per week.   This information is not intended to replace advice given to you by your health care provider. Make sure you discuss any questions you have with your health care provider.  Document Released: 05/23/2005 Document Revised: 07/30/2014 Document Reviewed: 10/21/2008 Elsevier Interactive Patient Education [employer].

## 2018-08-04 ENCOUNTER — Ambulatory Visit (HOSPITAL_BASED_OUTPATIENT_CLINIC_OR_DEPARTMENT_OTHER)
Admission: RE | Admit: 2018-08-04 | Discharge: 2018-08-04 | Disposition: A | Discharge status: Home / Self Care | Payer: Medicare PPO | Source: Ambulatory Visit | Attending: Family Medicine | Admitting: Family Medicine

## 2018-08-04 DIAGNOSIS — R05 Cough: Secondary | ICD-10-CM | POA: Insufficient documentation

## 2018-08-04 DIAGNOSIS — R059 Cough, unspecified: Secondary | ICD-10-CM

## 2018-08-04 NOTE — Progress Notes (Signed)
Can you please call this patient and tell her that her chest xray indicates that both lungs are clear and the opacity noted last month has resolved. Please call with any questions. Thank you

## 2018-08-07 ENCOUNTER — Encounter: Payer: Self-pay | Admitting: Family Medicine

## 2018-08-07 ENCOUNTER — Ambulatory Visit (INDEPENDENT_AMBULATORY_CARE_PROVIDER_SITE_OTHER): Payer: Medicare PPO | Admitting: Family Medicine

## 2018-08-07 ENCOUNTER — Telehealth: Payer: Self-pay | Admitting: Family Medicine

## 2018-08-07 VITALS — BP 138/80 | HR 100 | Temp 98.1°F | Resp 20

## 2018-08-07 DIAGNOSIS — J309 Allergic rhinitis, unspecified: Secondary | ICD-10-CM

## 2018-08-07 DIAGNOSIS — K219 Gastro-esophageal reflux disease without esophagitis: Secondary | ICD-10-CM | POA: Diagnosis not present

## 2018-08-07 DIAGNOSIS — T7800XD Anaphylactic reaction due to unspecified food, subsequent encounter: Secondary | ICD-10-CM

## 2018-08-07 DIAGNOSIS — J4540 Moderate persistent asthma, uncomplicated: Secondary | ICD-10-CM | POA: Insufficient documentation

## 2018-08-07 DIAGNOSIS — E1140 Type 2 diabetes mellitus with diabetic neuropathy, unspecified: Secondary | ICD-10-CM | POA: Diagnosis not present

## 2018-08-07 HISTORY — DX: Moderate persistent asthma, uncomplicated: J45.40

## 2018-08-07 MED ORDER — BUDESONIDE-FORMOTEROL FUMARATE 160-4.5 MCG/ACT IN AERO: INHALATION_SPRAY | RESPIRATORY_TRACT | 5 refills | Status: DC

## 2018-08-07 NOTE — Progress Notes (Addendum)
[address] [place] [address] Dept: [phone]  FOLLOW UP NOTE  Patient ID: Katelyn Fisher, female    DOB: 1950/12/08  Age: 68 y.o. MRN: [id number] Date of Office Visit: 08/07/2018  Assessment  Chief Complaint: Asthma  HPI Katelyn Fisher is a 68 year old female who presents to the clinic for a follow up visit. She was last seen in the clinic on 06/30/2018 by Gareth Morgan, NP for evaluation of acute bronchitis, asthma with exacerbation, and allergic rhinitis. At that time, she received azithromycin and a prednisone taper. Her symptoms worsened and a chest xray revealed pneumonia at which time she began doxycycline and cefdinir dual therapy. At today's visit, she reports her asthma has been moderately well controlled with occasional wheezing at night and occasional cough while talking or in dry weather. She is using Symbicort 160 and ProAir as needed which is about 1 time a week. She reports reflux is well controlled with Protonix as needed. Allergic rhinitis is reported as well controlled with Flonase as needed. Her current medications are listed in the chart.    Drug Allergies:  Allergies  Allergen Reactions  . Shellfish Allergy Hives, Shortness Of Breath and Swelling  . Oxycodone Swelling    Facial swelling and tightness  . Penicillins Swelling    Rash with welps  . Iodinated Diagnostic Agents Rash  . Sulfa Antibiotics Rash    Physical Exam: BP 138/80   Pulse 100   Temp 98.1 F (36.7 C) (Oral)   Resp 20   SpO2 98%    Physical Exam Vitals signs reviewed.  Constitutional:      Appearance: Normal appearance.  HENT:     Head: Normocephalic and atraumatic.     Right Ear: Tympanic membrane normal.     Left Ear: Tympanic membrane normal.     Nose:     Comments: Bilateral nares slightly erythematous with no nasal drainage noted. Pharynx normal. Ears normal. Eyes normal.    Mouth/Throat:     Pharynx: Oropharynx is clear.  Eyes:     Conjunctiva/sclera:  Conjunctivae normal.  Neck:     Musculoskeletal: Normal range of motion and neck supple.  Cardiovascular:     Rate and Rhythm: Normal rate and regular rhythm.     Heart sounds: Normal heart sounds. No murmur.  Pulmonary:     Effort: Pulmonary effort is normal.     Breath sounds: Normal breath sounds.     Comments: Lungs clear to auscultation Musculoskeletal: Normal range of motion.  Skin:    General: Skin is warm and dry.  Neurological:     Mental Status: She is alert and oriented to person, place, and time.  Psychiatric:        Mood and Affect: Mood normal.        Behavior: Behavior normal.        Thought Content: Thought content normal.        Judgment: Judgment normal.     Diagnostics: FVC 1.77, FEV1 1.32. Predicted FVC 2.25, predicted FEV1 1.75. Spirometry indicates mild restriction. This is consistent with previous spirometry readings.   Assessment and Plan: 1. Moderate persistent asthma without complication   2. Allergic rhinitis, unspecified seasonality, unspecified trigger   3. Anaphylactic shock due to food, subsequent encounter   4. Gastroesophageal reflux disease without esophagitis   5. Type 2 diabetes mellitus with diabetic neuropathy, without long-term current use of insulin ([place])     Meds ordered this encounter  Medications  .  budesonide-formoterol (SYMBICORT) 160-4.5 MCG/ACT inhaler    Sig: TWO PUFFS TWICE A DAY TO PREVENT COUGH OR WHEEZE. RINSE, GARGLE AND SPIT AFTER USE.    Dispense:  1 Inhaler    Refill:  5    Patient Instructions  Asthma Begin Symbicort 160-2 puffs twice a day with a spacer to prevent cough and wheeze  ProAir 2 puffs every 4 hours if needed for wheezing or coughing spells  Allergic rhinitis Continue Zyrtec 10 mg-take 1 tablet once a day if needed for runny nose or itchy eyes Flonase 1-2 sprays in each nostril once a day as needed for a stuffy nose Consider nasal rinses once a day as needed to reduce nasal symptoms  Food  allergy Continue avoiding shellfish.  If you have an allergic reaction take Benadryl 50 mg every 4 hours and if you have life-threatening symptoms inject with EpiPen 0.3 mg Continue on your other medications as listed in the chart  Reflux Continue lifestyle modifications   Call us if you are not doing well on this treatment plan  Follow up in 3 months or sooner if needed   Lifestyle Changes for Controlling GERD  When you have GERD, stomach acid feels as if it's backing up toward your mouth. Whether or not you take medication to control your GERD, your symptoms can often be improved with lifestyle changes.   Raise Your Head  Reflux is more likely to strike when you're lying down flat, because stomach fluid can  flow backward more easily. Raising the head of your bed 4-6 inches can help. To do this:  Slide blocks or books under the legs at the head of your bed. Or, place a wedge under  the mattress. Many foam stores can make a suitable wedge for you. The wedge  should run from your waist to the top of your head.  Don't just prop your head on several pillows. This increases pressure on your  stomach. It can make GERD worse.  Watch Your Eating Habits Certain foods may increase the acid in your stomach or relax the lower esophageal sphincter, making GERD more likely. It's best to avoid the following:  Coffee, tea, and carbonated drinks (with and without caffeine)  Fatty, fried, or spicy food  Mint, chocolate, onions, and tomatoes  Any other foods that seem to irritate your stomach or cause you pain  Relieve the Pressure  Eat smaller meals, even if you have to eat more often.  Don't lie down right after you eat. Wait a few hours for your stomach to empty.  Avoid tight belts and tight-fitting clothes.  Lose excess weight.  Tobacco and Alcohol  Avoid smoking tobacco and drinking alcohol. They can make GERD symptoms worse.    Return in about 3 months (around  11/06/2018), or if symptoms worsen or fail to improve.    Thank you for the opportunity to care for this patient.  Please do not hesitate to contact me with questions.  Gareth Morgan, FNP [facility]  _________________________________________________  I have provided oversight concerning Katelyn Fisher's evaluation and treatment of this patient's health issues addressed during today's encounter.  I agree with the assessment and therapeutic plan as outlined in the note.   Signed,   R Edgar Frisk, MD

## 2018-08-07 NOTE — Telephone Encounter (Signed)
Patient was seen this morning and has a question about an inhaler.

## 2018-08-07 NOTE — Telephone Encounter (Signed)
Spoke with pt, left a sample and coupon card at the front desk. Pt will pick up tomorrow.

## 2018-08-07 NOTE — Patient Instructions (Addendum)
Asthma Begin Symbicort 160-2 puffs twice a day with a spacer to prevent cough and wheeze  ProAir 2 puffs every 4 hours if needed for wheezing or coughing spells  Allergic rhinitis Continue Zyrtec 10 mg-take 1 tablet once a day if needed for runny nose or itchy eyes Flonase 1-2 sprays in each nostril once a day as needed for a stuffy nose Consider nasal rinses once a day as needed to reduce nasal symptoms  Food allergy Continue avoiding shellfish.  If you have an allergic reaction take Benadryl 50 mg every 4 hours and if you have life-threatening symptoms inject with EpiPen 0.3 mg Continue on your other medications as listed in the chart  Reflux Continue lifestyle modifications   Call us if you are not doing well on this treatment plan  Follow up in 3 months or sooner if needed   Lifestyle Changes for Controlling GERD  When you have GERD, stomach acid feels as if it's backing up toward your mouth. Whether or not you take medication to control your GERD, your symptoms can often be improved with lifestyle changes.   Raise Your Head  Reflux is more likely to strike when you're lying down flat, because stomach fluid can  flow backward more easily. Raising the head of your bed 4-6 inches can help. To do this:  Slide blocks or books under the legs at the head of your bed. Or, place a wedge under  the mattress. Many foam stores can make a suitable wedge for you. The wedge  should run from your waist to the top of your head.  Don't just prop your head on several pillows. This increases pressure on your  stomach. It can make GERD worse.  Watch Your Eating Habits Certain foods may increase the acid in your stomach or relax the lower esophageal sphincter, making GERD more likely. It's best to avoid the following:  Coffee, tea, and carbonated drinks (with and without caffeine)  Fatty, fried, or spicy food  Mint, chocolate, onions, and tomatoes  Any other foods that seem to  irritate your stomach or cause you pain  Relieve the Pressure  Eat smaller meals, even if you have to eat more often.  Don't lie down right after you eat. Wait a few hours for your stomach to empty.  Avoid tight belts and tight-fitting clothes.  Lose excess weight.  Tobacco and Alcohol  Avoid smoking tobacco and drinking alcohol. They can make GERD symptoms worse.

## 2018-08-11 ENCOUNTER — Ambulatory Visit: Payer: Medicare PPO | Admitting: Pediatrics

## 2018-08-29 ENCOUNTER — Ambulatory Visit (INDEPENDENT_AMBULATORY_CARE_PROVIDER_SITE_OTHER): Payer: Medicare PPO | Admitting: Family Medicine

## 2018-08-29 ENCOUNTER — Encounter: Payer: Self-pay | Admitting: Family Medicine

## 2018-08-29 VITALS — BP 122/74 | HR 102 | Temp 98.5°F | Ht 62.5 in | Wt 218.0 lb

## 2018-08-29 DIAGNOSIS — M25512 Pain in left shoulder: Secondary | ICD-10-CM

## 2018-08-29 MED ORDER — METHYLPREDNISOLONE ACETATE 40 MG/ML IJ SUSP
40.0000 mg | Freq: Once | INTRAMUSCULAR | Status: AC
  Administered 2018-08-29: 40 mg via INTRA_ARTICULAR

## 2018-08-29 NOTE — Addendum Note (Signed)
Addended by: Sharon Seller B on: 08/29/2018 03:22 PM   Modules accepted: Orders

## 2018-08-29 NOTE — Progress Notes (Signed)
Chief Complaint  Patient presents with  . Follow-up    Subjective: Patient is a 68 y.o. female here for f/u shoulder pain.  Did stretches/exercises and no better, slightly worse. No new inj or change in activity. Decreased ROM. No numbness/tingling. Has been using Tylenol and tramadol.   ROS: MSK: +L shoulder pain  Past Medical History:  Diagnosis Date  . Anxiety   . Arthritis    back  . Asthma   . Benign paroxysmal positional vertigo 11/01/2012  . Cancer ([place]) 12/22/2014   endometrial  . Depression   . Endometrial cancer ([place]) 2015  . Family history of breast cancer   . Family history of colon cancer   . Family history of ovarian cancer   . Family history of prostate cancer   . GERD (gastroesophageal reflux disease)   . History of colonic diverticulitis   . Hx of colonic polyps   . Hyperlipidemia   . Hypertension   . 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  . Low back pain radiating to right leg 08/18/2012  . Obesity   . Osteopenia 09/2017   T score -1.2 FRAX 3.2% / 0.3%  . Personal history of chemotherapy 2015  . Radiation 12/17/14-01/03/15   vaginal brachytherapy  . Thyroid disease 06/01/2013    Objective: BP 122/74 (BP Location: Left Arm, Patient Position: Sitting, Cuff Size: Large)   Pulse (!) 102   Temp 98.5 F (36.9 C) (Oral)   Ht 5' 2.5" (1.588 m)   Wt 218 lb (98.9 kg)   SpO2 96%   BMI 39.24 kg/m  General: Awake, appears stated age MSK: L shoulder- decreased ROM, +Neer's, Hawkins, neg cross over, speed's, lift off Lungs: No accessory muscle use Psych: Age appropriate judgment and insight, normal affect and mood  Procedure Note; Shoulder bursa injection Verbal consent obtained. The area was palpated, an area was marked just caudal to the acromion process laterally, and cleaned with alcohol x1. A 27-gauge needle was used to enter the joint laterally with ease. 40 mg of Depomedrol with 2 mL of 1% lidocaine was injected. The patient tolerated the procedure  well. There were no complications noted.  Assessment and Plan: Left shoulder pain, unspecified chronicity - Plan: PR DRAIN/INJECT LARGE JOINT/BURSA  Stretches/exercises, injection, will consider PT if no better in next week or so. Will let us know via MyChart. F/u for this PRN.  The patient voiced understanding and agreement to the plan.  [place], DO 08/29/18  2:59 PM

## 2018-08-29 NOTE — Patient Instructions (Signed)
Heat (pad or rice pillow in microwave) over affected area, 10-15 minutes twice daily.   Ice/cold pack over area for 10-15 min twice daily.  OK to take Tylenol 1000 mg (2 extra strength tabs) or 975 mg (3 regular strength tabs) every 6 hours as needed.  Goal weight by 12/24/2018: 200-205 lbs.   Send me a message on 2/17 if not having noticeable benefit.   EXERCISES  RANGE OF MOTION (ROM) AND STRETCHING EXERCISES These exercises may help you when beginning to rehabilitate your injury. While completing these exercises, remember:   Restoring tissue flexibility helps normal motion to return to the joints. This allows healthier, less painful movement and activity.  An effective stretch should be held for at least 30 seconds.  A stretch should never be painful. You should only feel a gentle lengthening or release in the stretched tissue.  ROM - Pendulum  Bend at the waist so that your right / left arm falls away from your body. Support yourself with your opposite hand on a solid surface, such as a table or a countertop.  Your right / left arm should be perpendicular to the ground. If it is not perpendicular, you need to lean over farther. Relax the muscles in your right / left arm and shoulder as much as possible.  Gently sway your hips and trunk so they move your right / left arm without any use of your right / left shoulder muscles.  Progress your movements so that your right / left arm moves side to side, then forward and backward, and finally, both clockwise and counterclockwise.  Complete 10-15 repetitions in each direction. Many people use this exercise to relieve discomfort in their shoulder as well as to gain range of motion. Repeat 2 times. Complete this exercise 3 times per week.  STRETCH - Flexion, Standing  Stand with good posture. With an underhand grip on your right / left hand and an overhand grip on the opposite hand, grasp a broomstick or cane so that your hands are a  little more than shoulder-width apart.  Keeping your right / left elbow straight and shoulder muscles relaxed, push the stick with your opposite hand to raise your right / left arm in front of your body and then overhead. Raise your arm until you feel a stretch in your right / left shoulder, but before you have increased shoulder pain.  Try to avoid shrugging your right / left shoulder as your arm rises by keeping your shoulder blade tucked down and toward your mid-back spine. Hold 30 seconds.  Slowly return to the starting position. Repeat 2 times. Complete this exercise 3 times per week.  STRETCH - Internal Rotation  Place your right / left hand behind your back, palm-up.  Throw a towel or belt over your opposite shoulder. Grasp the towel/belt with your right / left hand.  While keeping an upright posture, gently pull up on the towel/belt until you feel a stretch in the front of your right / left shoulder.  Avoid shrugging your right / left shoulder as your arm rises by keeping your shoulder blade tucked down and toward your mid-back spine.  Hold 30. Release the stretch by lowering your opposite hand. Repeat 2 times. Complete this exercise 3 times per week.  STRETCH - External Rotation and Abduction  Stagger your stance through a doorframe. It does not matter which foot is forward.  As instructed by your physician, physical therapist or athletic trainer, place your hands: ? And  forearms above your head and on the door frame. ? And forearms at head-height and on the door frame. ? At elbow-height and on the door frame.  Keeping your head and chest upright and your stomach muscles tight to prevent over-extending your low-back, slowly shift your weight onto your front foot until you feel a stretch across your chest and/or in the front of your shoulders.  Hold 30 seconds. Shift your weight to your back foot to release the stretch. Repeat 2 times. Complete this stretch 3 times per week.    STRENGTHENING EXERCISES  These exercises may help you when beginning to rehabilitate your injury. They may resolve your symptoms with or without further involvement from your physician, physical therapist or athletic trainer. While completing these exercises, remember:   Muscles can gain both the endurance and the strength needed for everyday activities through controlled exercises.  Complete these exercises as instructed by your physician, physical therapist or athletic trainer. Progress the resistance and repetitions only as guided.  You may experience muscle soreness or fatigue, but the pain or discomfort you are trying to eliminate should never worsen during these exercises. If this pain does worsen, stop and make certain you are following the directions exactly. If the pain is still present after adjustments, discontinue the exercise until you can discuss the trouble with your clinician.  If advised by your physician, during your recovery, avoid activity or exercises which involve actions that place your right / left hand or elbow above your head or behind your back or head. These positions stress the tissues which are trying to heal.  STRENGTH - Scapular Depression and Adduction  With good posture, sit on a firm chair. Supported your arms in front of you with pillows, arm rests or a table top. Have your elbows in line with the sides of your body.  Gently draw your shoulder blades down and toward your mid-back spine. Gradually increase the tension without tensing the muscles along the top of your shoulders and the back of your neck.  Hold for 3 seconds. Slowly release the tension and relax your muscles completely before completing the next repetition.  After you have practiced this exercise, remove the arm support and complete it in standing as well as sitting. Repeat 2 times. Complete this exercise 3 times per week.   STRENGTH - External Rotators  Secure a rubber exercise  band/tubing to a fixed object so that it is at the same height as your right / left elbow when you are standing or sitting on a firm surface.  Stand or sit so that the secured exercise band/tubing is at your side that is not injured.  Bend your elbow 90 degrees. Place a folded towel or small pillow under your right / left arm so that your elbow is a few inches away from your side.  Keeping the tension on the exercise band/tubing, pull it away from your body, as if pivoting on your elbow. Be sure to keep your body steady so that the movement is only coming from your shoulder rotating.  Hold 3 seconds. Release the tension in a controlled manner as you return to the starting position. Repeat 2 times. Complete this exercise 3 times per week.   STRENGTH - Supraspinatus  Stand or sit with good posture. Grasp a 2-3 lb weight or an exercise band/tubing so that your hand is "thumbs-up," like when you shake hands.  Slowly lift your right / left hand from your thigh into the air,  traveling about 30 degrees from straight out at your side. Lift your hand to shoulder height or as far as you can without increasing any shoulder pain. Initially, many people do not lift their hands above shoulder height.  Avoid shrugging your right / left shoulder as your arm rises by keeping your shoulder blade tucked down and toward your mid-back spine.  Hold for 3 seconds. Control the descent of your hand as you slowly return to your starting position. Repeat 2 times. Complete this exercise 3 times per week.   STRENGTH - Shoulder Extensors  Secure a rubber exercise band/tubing so that it is at the height of your shoulders when you are either standing or sitting on a firm arm-less chair.  With a thumbs-up grip, grasp an end of the band/tubing in each hand. Straighten your elbows and lift your hands straight in front of you at shoulder height. Step back away from the secured end of band/tubing until it becomes  tense.  Squeezing your shoulder blades together, pull your hands down to the sides of your thighs. Do not allow your hands to go behind you.  Hold for 3 seconds. Slowly ease the tension on the band/tubing as you reverse the directions and return to the starting position. Repeat 2 times. Complete this exercise 3 times per week.   STRENGTH - Scapular Retractors  Secure a rubber exercise band/tubing so that it is at the height of your shoulders when you are either standing or sitting on a firm arm-less chair.  With a palm-down grip, grasp an end of the band/tubing in each hand. Straighten your elbows and lift your hands straight in front of you at shoulder height. Step back away from the secured end of band/tubing until it becomes tense.  Squeezing your shoulder blades together, draw your elbows back as you bend them. Keep your upper arm lifted away from your body throughout the exercise.  Hold 3 seconds. Slowly ease the tension on the band/tubing as you reverse the directions and return to the starting position. Repeat 2 times. Complete this exercise 3 times per week.  STRENGTH - Scapular Depressors  Find a sturdy chair without wheels, such as a from a dining room table.  Keeping your feet on the floor, lift your bottom from the seat and lock your elbows.  Keeping your elbows straight, allow gravity to pull your body weight down. Your shoulders will rise toward your ears.  Raise your body against gravity by drawing your shoulder blades down your back, shortening the distance between your shoulders and ears. Although your feet should always maintain contact with the floor, your feet should progressively support less body weight as you get stronger.  Hold 3 seconds. In a controlled and slow manner, lower your body weight to begin the next repetition. Repeat 2 times. Complete this exercise 3 times per week.   This information is not intended to replace advice given to you by your health  care provider. Make sure you discuss any questions you have with your health care provider.  Document Released: 05/23/2005 Document Revised: 07/30/2014 Document Reviewed: 10/21/2008 Elsevier Interactive Patient Education [employer].

## 2018-09-08 ENCOUNTER — Other Ambulatory Visit: Payer: Self-pay | Admitting: Family Medicine

## 2018-09-08 ENCOUNTER — Encounter: Payer: Self-pay | Admitting: Obstetrics & Gynecology

## 2018-09-08 ENCOUNTER — Encounter: Payer: Self-pay | Admitting: Family Medicine

## 2018-09-08 ENCOUNTER — Ambulatory Visit (INDEPENDENT_AMBULATORY_CARE_PROVIDER_SITE_OTHER): Payer: Medicare PPO | Admitting: Obstetrics & Gynecology

## 2018-09-08 VITALS — BP 128/86 | Ht 61.5 in | Wt 213.0 lb

## 2018-09-08 DIAGNOSIS — Z9289 Personal history of other medical treatment: Secondary | ICD-10-CM

## 2018-09-08 DIAGNOSIS — Z90722 Acquired absence of ovaries, bilateral: Secondary | ICD-10-CM

## 2018-09-08 DIAGNOSIS — Z90710 Acquired absence of both cervix and uterus: Secondary | ICD-10-CM

## 2018-09-08 DIAGNOSIS — M542 Cervicalgia: Secondary | ICD-10-CM

## 2018-09-08 DIAGNOSIS — Z9079 Acquired absence of other genital organ(s): Secondary | ICD-10-CM

## 2018-09-08 DIAGNOSIS — C541 Malignant neoplasm of endometrium: Secondary | ICD-10-CM

## 2018-09-08 DIAGNOSIS — E6609 Other obesity due to excess calories: Secondary | ICD-10-CM

## 2018-09-08 DIAGNOSIS — Z6839 Body mass index (BMI) 39.0-39.9, adult: Secondary | ICD-10-CM

## 2018-09-08 DIAGNOSIS — Z1272 Encounter for screening for malignant neoplasm of vagina: Secondary | ICD-10-CM | POA: Diagnosis not present

## 2018-09-08 DIAGNOSIS — Z01419 Encounter for gynecological examination (general) (routine) without abnormal findings: Secondary | ICD-10-CM

## 2018-09-08 DIAGNOSIS — M85852 Other specified disorders of bone density and structure, left thigh: Secondary | ICD-10-CM

## 2018-09-08 DIAGNOSIS — Z1231 Encounter for screening mammogram for malignant neoplasm of breast: Secondary | ICD-10-CM

## 2018-09-08 DIAGNOSIS — Z8542 Personal history of malignant neoplasm of other parts of uterus: Secondary | ICD-10-CM

## 2018-09-08 NOTE — Progress Notes (Signed)
Katelyn Fisher [birth year] [id number]   History:    68 y.o. G2P2L3 1 adopted child.  Married.  RP:  Established patient presenting for annual gyn exam   HPI: Menopause, well on no HRT.  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  Had a total hysterectomy with bilateral salpingo-oophorectomy and staging and postop chemotherapy and radiation treatment which finished in 2016.  Alternating between gynecologic oncology and myself for Pap tests every 6 months.  Pelvic CT negative 10/2017.  No pelvic pain.  Breasts normal.  Last mammogram negative April 2019 at the [facility].  Health labs with her family physician.  Continues to go to the [facility] regularly for fitness.  Low calorie diet.  Loosing weight, BMI from 44+ last year to 39.59 today.  Past medical history,surgical history, family history and social history were all reviewed and documented in the EPIC chart.  Gynecologic History No LMP recorded. Patient has had a hysterectomy. Contraception: status post hysterectomy Last Pap: 08/2017. Results were: Negative Last mammogram: 10/2017. Results were: Negative Bone Density: 09/2017 Osteopenia -1.2 at Left Femoral Neck. Colonoscopy: 2018  Obstetric History OB History  Gravida Para Term Preterm AB Living  2 2       3   SAB TAB Ectopic Multiple Live Births               # Outcome Date GA Lbr Len/2nd Weight Sex Delivery Anes PTL Lv  2 Para           1 Para             Obstetric Comments  1 adopted child     ROS: A ROS was performed and pertinent positives and negatives are included in the history.  GENERAL: No fevers or chills. HEENT: No change in vision, no earache, sore throat or sinus congestion. NECK: No pain or stiffness. CARDIOVASCULAR: No chest pain or pressure. No palpitations. PULMONARY: No shortness of breath, cough or wheeze. GASTROINTESTINAL: No abdominal pain, nausea, vomiting or diarrhea, melena or bright red blood per rectum. GENITOURINARY: No  urinary frequency, urgency, hesitancy or dysuria. MUSCULOSKELETAL: No joint or muscle pain, no back pain, no recent trauma. DERMATOLOGIC: No rash, no itching, no lesions. ENDOCRINE: No polyuria, polydipsia, no heat or cold intolerance. No recent change in weight. HEMATOLOGICAL: No anemia or easy bruising or bleeding. NEUROLOGIC: No headache, seizures, numbness, tingling or weakness. PSYCHIATRIC: No depression, no loss of interest in normal activity or change in sleep pattern.     Exam:   BP 128/86   Ht 5' 1.5" (1.562 m)   Wt 213 lb (96.6 kg)   BMI 39.59 kg/m   Body mass index is 39.59 kg/m.  General appearance : Well developed well nourished female. No acute distress HEENT: Eyes: no retinal hemorrhage or exudates,  Neck supple, trachea midline, no carotid bruits, no thyroidmegaly Lungs: Clear to auscultation, no rhonchi or wheezes, or rib retractions  Heart: Regular rate and rhythm, no murmurs or gallops Breast:Examined in sitting and supine position were symmetrical in appearance, no palpable masses or tenderness,  no skin retraction, no nipple inversion, no nipple discharge, no skin discoloration, no axillary or supraclavicular lymphadenopathy Abdomen: no palpable masses or tenderness, no rebound or guarding Extremities: no edema or skin discoloration or tenderness  Pelvic: Vulva: Normal             Vagina: No gross lesions or discharge.  Pap reflex done on vaginal vault.  Cervix/Uterus absent  Adnexa  Without  masses or tenderness  Anus: Normal  Pelvic CT scan negative 10/2017.   Assessment/Plan:  68 y.o. female for annual exam   1. Well female exam with routine gynecological exam Gynecologic exam status post TAH/BSO for mixed serous/endometrioid adenocarcinoma of the endometrium stage Ia.  Pap reflex done on the vaginal vault.  Breast exam normal.  Screening mammogram in April 2019 was negative.  Colonoscopy in 2018.  Health labs with family physician.  2. S/P TAH-BSO  3.  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  Followed by oncology.  4. Osteopenia of neck of left femur Osteopenia on bone density March 2019.  We will repeat a bone density in March 2021.  Continue with vitamin D, calcium intake of 1.5 g/day and regular weightbearing physical activities.  5. Class 2 obesity due to excess calories without serious comorbidity with body mass index (BMI) of 39.0 to 39.9 in adult Patient is currently losing weight with a low calorie/carb diet.  Recommend aerobic physical activities 5 times a week and weightlifting every 2 days as soon as she is better with her left shoulder.  Princess Bruins MD, 9:37 AM 09/08/2018

## 2018-09-08 NOTE — Progress Notes (Signed)
dg 

## 2018-09-08 NOTE — Patient Instructions (Signed)
1. Well female exam with routine gynecological exam Gynecologic exam status post TAH/BSO for mixed serous/endometrioid adenocarcinoma of the endometrium stage Ia.  Pap reflex done on the vaginal vault.  Breast exam normal.  Screening mammogram in April 2019 was negative.  Colonoscopy in 2018.  Health labs with family physician.  2. S/P TAH-BSO  3. 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  Followed by oncology.  4. Osteopenia of neck of left femur Osteopenia on bone density March 2019.  We will repeat a bone density in March 2021.  Continue with vitamin D, calcium intake of 1.5 g/day and regular weightbearing physical activities.  5. Class 2 obesity due to excess calories without serious comorbidity with body mass index (BMI) of 39.0 to 39.9 in adult Patient is currently losing weight with a low calorie/carb diet.  Recommend aerobic physical activities 5 times a week and weightlifting every 2 days as soon as she is better with her left shoulder.  Katelyn Fisher, it was a pleasure seeing you today!  I will inform you of your results as soon as they are available.

## 2018-09-09 ENCOUNTER — Other Ambulatory Visit (HOSPITAL_BASED_OUTPATIENT_CLINIC_OR_DEPARTMENT_OTHER): Payer: Self-pay | Admitting: Family Medicine

## 2018-09-09 ENCOUNTER — Encounter: Payer: Self-pay | Admitting: Family Medicine

## 2018-09-09 ENCOUNTER — Ambulatory Visit (HOSPITAL_BASED_OUTPATIENT_CLINIC_OR_DEPARTMENT_OTHER)
Admission: RE | Admit: 2018-09-09 | Discharge: 2018-09-09 | Disposition: A | Discharge status: Home / Self Care | Payer: Medicare PPO | Source: Ambulatory Visit | Attending: Family Medicine | Admitting: Family Medicine

## 2018-09-09 DIAGNOSIS — Z1231 Encounter for screening mammogram for malignant neoplasm of breast: Secondary | ICD-10-CM

## 2018-09-09 DIAGNOSIS — M542 Cervicalgia: Secondary | ICD-10-CM | POA: Diagnosis not present

## 2018-09-09 LAB — PAP IG W/ RFLX HPV ASCU

## 2018-09-10 ENCOUNTER — Ambulatory Visit: Payer: Medicare PPO | Admitting: *Deleted

## 2018-09-10 ENCOUNTER — Other Ambulatory Visit: Payer: Self-pay | Admitting: Family Medicine

## 2018-09-10 DIAGNOSIS — M542 Cervicalgia: Secondary | ICD-10-CM

## 2018-09-10 DIAGNOSIS — M25512 Pain in left shoulder: Secondary | ICD-10-CM

## 2018-09-22 ENCOUNTER — Other Ambulatory Visit: Payer: Self-pay

## 2018-09-22 ENCOUNTER — Encounter: Payer: Self-pay | Admitting: Gynecologic Oncology

## 2018-09-22 ENCOUNTER — Encounter: Payer: Self-pay | Admitting: Physical Therapy

## 2018-09-22 ENCOUNTER — Ambulatory Visit: Payer: Medicare PPO | Attending: Family Medicine | Admitting: Physical Therapy

## 2018-09-22 DIAGNOSIS — R293 Abnormal posture: Secondary | ICD-10-CM | POA: Insufficient documentation

## 2018-09-22 DIAGNOSIS — M25512 Pain in left shoulder: Secondary | ICD-10-CM | POA: Insufficient documentation

## 2018-09-22 DIAGNOSIS — G8929 Other chronic pain: Secondary | ICD-10-CM

## 2018-09-22 DIAGNOSIS — M542 Cervicalgia: Secondary | ICD-10-CM

## 2018-09-22 DIAGNOSIS — M6281 Muscle weakness (generalized): Secondary | ICD-10-CM | POA: Diagnosis not present

## 2018-09-22 DIAGNOSIS — M25612 Stiffness of left shoulder, not elsewhere classified: Secondary | ICD-10-CM | POA: Insufficient documentation

## 2018-09-22 NOTE — Therapy (Signed)
[facility] [address]  [place], [place], [address] Phone: [phone]   Fax:  [phone]  Physical Therapy Evaluation  Patient Details  Name: Katelyn Fisher MRN: [id number] Date of Birth: [birth year] Referring Provider (PT): Riki Sheer, MD   Encounter Date: 09/22/2018  PT End of Session - 09/22/18 1018    Visit Number  1    Number of Visits  13    Date for PT Re-Evaluation  11/03/18    Authorization Type  [employer] & [place] [facility]    PT Start Time  0930    PT Stop Time  1013    PT Time Calculation (min)  43 min    Activity Tolerance  Patient tolerated treatment well;Patient limited by pain    Behavior During Therapy  [facility] for tasks assessed/performed       Past Medical History:  Diagnosis Date  . Anxiety   . Arthritis    back  . Asthma   . Benign paroxysmal positional vertigo 11/01/2012  . Cancer ([place]) 12/22/2014   endometrial  . Depression   . Endometrial cancer ([place]) 2015  . Family history of breast cancer   . Family history of colon cancer   . Family history of ovarian cancer   . Family history of prostate cancer   . GERD (gastroesophageal reflux disease)   . History of colonic diverticulitis   . Hx of colonic polyps   . Hyperlipidemia   . Hypertension   . 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  . Low back pain radiating to right leg 08/18/2012  . Obesity   . Osteopenia 09/2017   T score -1.2 FRAX 3.2% / 0.3%  . Personal history of chemotherapy 2015  . Radiation 12/17/14-01/03/15   vaginal brachytherapy  . Thyroid disease 06/01/2013    Past Surgical History:  Procedure Laterality Date  . ABDOMINAL HYSTERECTOMY  06/23/14   [facility], TRH/BSO  . CHOLECYSTECTOMY  1991  . DILATION AND CURETTAGE OF UTERUS    . IR REMOVAL TUN ACCESS W/ PORT W/O FL MOD SED  04/30/2017  . TUBAL LIGATION      There were no vitals filed for this visit.   Subjective Assessment - 09/22/18 0933    Subjective   Patient reports L shoulder pain started in November 2019 without causative event or change in activity. Pain is located over L superior shoulder with radiation down lateral arm and hand. Mentions that she has neuropathy in B hands. Denies N/T in L arm/shoulder. Worse with carrying pocketbook on that shoulder, carrying items with L hand, reaching overhead, chores that require B hands like sweeping. Recent injection, ice, heat help a little. Neck pain started a couple weeks ago. Noticed that when turning her head to the L, she had radiation of pain down L shoulder blade. Worse with L cervical rotation, flexion, sidelying on L.    Pertinent History  thyroid disease, hx of endometrial CA treated with chemo, osteopenia, LBP HTN, HLD, GERD, BPPV, asthma    Limitations  Lifting;House hold activities    Diagnostic tests  09/09/18 cervical xray: Cervical spondylosis and loss of lordosis without acute osseous findings    Patient Stated Goals  "i want to get rid of this pain"    Currently in Pain?  Yes    Pain Score  8     Pain Location  Shoulder    Pain Orientation  Left    Pain Descriptors / Indicators  Aching  Pain Type  Chronic pain    Multiple Pain Sites  Yes    Pain Score  5    Pain Location  Neck    Pain Orientation  Left    Pain Descriptors / Indicators  Aching    Pain Type  Acute pain         [facility] PT Assessment - 09/22/18 0944      Assessment   Medical Diagnosis  L shoulder pain, neck pain    Referring Provider (PT)  Riki Sheer, MD    Onset Date/Surgical Date  05/23/18    Hand Dominance  Right    Next MD Visit  Not scheduled    Prior Therapy  Yes      Precautions   Precautions  --   osteopenia     Restrictions   Weight Bearing Restrictions  No      Balance Screen   Has the patient fallen in the past 6 months  No    Has the patient had a decrease in activity level because of a fear of falling?   No    Is the patient reluctant to leave their home because of a fear of  falling?   No      Home Environment   Living Environment  Private residence    Living Arrangements  Spouse/significant other    Available Help at Discharge  Family      Prior Function   Level of [place]  Retired    Leisure  gym, crafts      Cognition   Overall Cognitive Status  Within Functional Limits for tasks assessed      Sensation   Light Touch  Appears Intact      Coordination   Gross Motor Movements are Fluid and Coordinated  Yes      Posture/Postural Control   Posture/Postural Control  Postural limitations    Postural Limitations  Rounded Shoulders;Forward head;Weight shift right      ROM / Strength   AROM / PROM / Strength  AROM;Strength      AROM   AROM Assessment Site  Shoulder;Cervical    Right/Left Shoulder  Right;Left    Right Shoulder Flexion  175 Degrees    Right Shoulder ABduction  140 Degrees    Right Shoulder Internal Rotation  --   FIR T8   Right Shoulder External Rotation  --   FER T2   Left Shoulder Flexion  125 Degrees    Left Shoulder ABduction  116 Degrees    Left Shoulder Internal Rotation  --   FIR T10   Left Shoulder External Rotation  --   FER C7   Cervical Flexion  29   pain in L UT   Cervical Extension  28   pain in L UT   Cervical - Right Side Bend  36   more pain compared to L   Cervical - Left Side Bend  30    Cervical - Right Rotation  WFL    Cervical - Left Rotation  WFL   pain in L UT     Strength   Strength Assessment Site  Shoulder;Elbow;Wrist    Right/Left Shoulder  Right;Left    Right Shoulder Flexion  4+/5    Right Shoulder ABduction  4+/5    Right Shoulder Internal Rotation  4+/5    Right Shoulder External Rotation  4/5    Left Shoulder Flexion  4-/5   pain  Left Shoulder ABduction  3+/5   pain   Left Shoulder Internal Rotation  4-/5   pain   Left Shoulder External Rotation  3+/5   pain   Right/Left Elbow  Right;Left    Right Elbow Flexion  4+/5    Right Elbow Extension   4+/5    Left Elbow Flexion  4/5    Left Elbow Extension  4/5    Right/Left Wrist  Right;Left    Right Wrist Flexion  4+/5    Right Wrist Extension  4+/5    Left Wrist Radial Deviation  4+/5    Left Wrist Ulnar Deviation  4+/5      Palpation   Palpation comment  mildly TTP in L UT, LS, rhomboid, over area of supraspinatus                Objective measurements completed on examination: See above findings.              PT Education - 09/22/18 1017    Education Details  prognosis, POC, HEP    Person(s) Educated  Patient    Methods  Explanation;Demonstration;Tactile cues;Verbal cues;Handout    Comprehension  Verbalized understanding;Returned demonstration       PT Short Term Goals - 09/22/18 1028      PT SHORT TERM GOAL #1   Title  Patient to be independent with initial HEP.    Time  3    Period  Weeks    Status  New    Target Date  10/13/18        PT Long Term Goals - 09/22/18 1028      PT LONG TERM GOAL #1   Title  Patient to be independent with advanced HEP.    Time  6    Period  Weeks    Status  New    Target Date  11/03/18      PT LONG TERM GOAL #2   Title  Patient to demonstrate L shoulder strength >=4+/5.    Time  6    Period  Weeks    Status  New    Target Date  11/03/18      PT LONG TERM GOAL #3   Title  Patient to demonstrate L shoulder and cervical AROM WFL and without pain limiting.     Time  6    Period  Weeks    Status  New    Target Date  11/03/18      PT LONG TERM GOAL #4   Title  Patient to report tolerance of using L hand for sweeping without pain.     Time  6    Period  Weeks    Status  New    Target Date  11/03/18      PT LONG TERM GOAL #5   Title  Patient to report 80% improvement in L shoulder and neck pain.    Time  6    Period  Weeks    Status  New    Target Date  11/03/18             Plan - 09/22/18 1018    Clinical Impression Statement  Patient is a 67y/o F presenting to OPPT with c/o insidious  L shoulder and neck pain. L superior shoulder pain has been bothering her since November 2019. Notes intermittent radiation down lateral arm and hand. Worse with carrying pocketbook on that shoulder, carrying items with L hand, reaching overhead, chores that  require B hands like sweeping. L sided neck pain onset was a couple weeks ago. Reports radiation into posterior periscapular region. Worse with cervical rotation to L, flexion, and L sidelying. Patient without with limited and painful L shoulder and cervical AROM, decreased strength in L shoulder, mild tenderness to cervical and shoulder musculature, and abnormal posture. Patient educated on gentle stretching and strengthening HEP- patient reported understanding. Would benefit from skilled PT services 2x/week for 6 weeks to address aforementioned impairments.     Personal Factors and Comorbidities  Age;Comorbidity 3+;Time since onset of injury/illness/exacerbation    Comorbidities  thyroid disease, hx of endometrial CA treated with chemo, osteopenia, LBP HTN, HLD, GERD, BPPV, asthma    Examination-Activity Limitations  Reach Overhead;Carry;Dressing;Hygiene/Grooming;Lift;Bathing;Sleep    Examination-Participation Restrictions  Cleaning;Driving;Laundry;Meal Prep    Stability/Clinical Decision Making  Evolving/Moderate complexity    Clinical Decision Making  Moderate    Rehab Potential  Good    PT Frequency  2x / week    PT Duration  6 weeks    PT Treatment/Interventions  ADLs/Self Care Home Management;Cryotherapy;Electrical Stimulation;Iontophoresis 4mg /ml Dexamethasone;Functional mobility training;Ultrasound;Moist Heat;Therapeutic activities;Therapeutic exercise;Neuromuscular re-education;Patient/family education;Passive range of motion;Manual techniques;Dry needling;Energy conservation;Splinting;Taping;Vasopneumatic Device    PT Next Visit Plan  reassess HEP    Consulted and Agree with Plan of Care  Patient       Patient will benefit from skilled  therapeutic intervention in order to improve the following deficits and impairments:  Decreased activity tolerance, Decreased strength, Impaired UE functional use, Pain, Improper body mechanics, Decreased range of motion, Impaired flexibility, Postural dysfunction  Visit Diagnosis: Chronic left shoulder pain  Stiffness of left shoulder, not elsewhere classified  Cervicalgia  Muscle weakness (generalized)  Abnormal posture     Problem List Patient Active Problem List   Diagnosis Date Noted  . Moderate persistent asthma without complication 19/50/9326  . Current use of beta blocker 06/30/2018  . Allergic rhinitis 06/30/2018  . Acute bronchitis due to other specified organisms 06/30/2018  . History of endometrial cancer 02/03/2018  . Screening Mammogram 11/05/2017  . 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  . Type 2 diabetes mellitus with diabetic neuropathy, without long-term current use of insulin ([place]) 10/25/2016  . Microcytosis 09/28/2016  . Mild intermittent asthma without complication 71/24/5809  . Port catheter in place 11/15/2015  . Chemotherapy induced neutropenia ([place]) 11/29/2014  . Family history of breast cancer   . Family history of ovarian cancer   . Family history of prostate cancer   . Family history of colon cancer   . Morbid obesity with body mass index of 40.0-44.9 in adult [facility]) 11/09/2014  . Chemotherapy-induced peripheral neuropathy ([place]) 09/22/2014  . Endometrial cancer ([place]) 05/20/2014  . 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  . Thyroid disease 06/01/2013  . Low back pain radiating to right leg 08/18/2012  . Anemia 11/07/2011  . Hyperlipidemia 06/15/2011  . Diabetes mellitus type 2 in obese ([place]) 03/12/2011  . Chronic pain of right knee 01/27/2011  . Obstructive sleep apnea 11/15/2010  . Osteoarthritis 11/15/2010  . Gastroesophageal reflux disease without esophagitis 09/14/2010  . DIVERTICULITIS, HX OF 09/14/2010  . POSTMENOPAUSAL  STATUS 10/01/2008  . COLONIC POLYPS, HX OF 03/10/2008  . Essential hypertension 08/13/2006     Janene Harvey, PT, DPT 09/22/18 10:31 AM   [facility] [address]  [place], [place], [address] Phone: [phone]   Fax:  [phone]  Name: NIKIYAH FACKLER MRN: [id number] Date of Birth: 09-12-1950

## 2018-09-25 ENCOUNTER — Encounter: Payer: Self-pay | Admitting: Gynecologic Oncology

## 2018-09-25 ENCOUNTER — Other Ambulatory Visit: Payer: Self-pay

## 2018-09-25 ENCOUNTER — Inpatient Hospital Stay: Payer: Medicare PPO | Attending: Gynecologic Oncology | Admitting: Gynecologic Oncology

## 2018-09-25 VITALS — BP 114/76 | HR 95 | Temp 98.4°F | Resp 18 | Ht 61.5 in | Wt 213.0 lb

## 2018-09-25 DIAGNOSIS — Z9221 Personal history of antineoplastic chemotherapy: Secondary | ICD-10-CM | POA: Diagnosis not present

## 2018-09-25 DIAGNOSIS — I10 Essential (primary) hypertension: Secondary | ICD-10-CM | POA: Insufficient documentation

## 2018-09-25 DIAGNOSIS — J45909 Unspecified asthma, uncomplicated: Secondary | ICD-10-CM | POA: Diagnosis not present

## 2018-09-25 DIAGNOSIS — Z90722 Acquired absence of ovaries, bilateral: Secondary | ICD-10-CM

## 2018-09-25 DIAGNOSIS — M1990 Unspecified osteoarthritis, unspecified site: Secondary | ICD-10-CM | POA: Diagnosis not present

## 2018-09-25 DIAGNOSIS — Z6841 Body Mass Index (BMI) 40.0 and over, adult: Secondary | ICD-10-CM | POA: Insufficient documentation

## 2018-09-25 DIAGNOSIS — F329 Major depressive disorder, single episode, unspecified: Secondary | ICD-10-CM | POA: Insufficient documentation

## 2018-09-25 DIAGNOSIS — K219 Gastro-esophageal reflux disease without esophagitis: Secondary | ICD-10-CM | POA: Diagnosis not present

## 2018-09-25 DIAGNOSIS — Z923 Personal history of irradiation: Secondary | ICD-10-CM

## 2018-09-25 DIAGNOSIS — C541 Malignant neoplasm of endometrium: Secondary | ICD-10-CM | POA: Diagnosis not present

## 2018-09-25 DIAGNOSIS — Z90710 Acquired absence of both cervix and uterus: Secondary | ICD-10-CM

## 2018-09-25 NOTE — Progress Notes (Signed)
GYNECOLOGIC ONCOLOGY OFFICE NOTE   Chief Complaint: Uterine serous endometrial cancer. Surveillance   Assessment/Plan: Katelyn Fisher is a 68 y.o. woman with pre op diagnosis of grade 1 endometrial cancer . 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   6//6 cycles adjuvant platinum and taxane chemotherapy and  vaginal brachytherapy completed 12/2014 No evidence of disease F/U 02/2019 for serial examination  Follow-up with Dr. Alvy Bimler as scheduled. Follow-up with Gyn Onc in 6 months   History of Present Illness: Katelyn Fisher is a 68 y.o. who presented with a two-week history of postmenopausal bleeding. A pelvic ultrasound showed fibroids and a thickened endometrial stripe. She subsequently underwent an endometrial biopsy which was concerning for at least grade 1 endometrioid adenocarcinoma.   Oncology Summary:  Oncology History    Stage IA mixed serous (50%) and endometrioid (50%) adenocarcinoma of the endometrium       Endometrial cancer ([place])   05/07/2014 Initial Diagnosis    Patient presented to Dr Katelyn Fisher with new onset postmenopausal spotting. [place] 05-07-14 reportedly had thickened endometrial stripe, with 3 fibroids largest 5x 5x 3.5 cm; endometrial biopsy 05-07-14 reportedly was concerning for at least FIGO grade 1 endometrial carcinoma    06/23/2014 Surgery    Surgery was robotic hysterectomy, BSO, bilateral pelvic and para-aortic nodes by Dr Skeet Latch at [facility] on 06-23-14    06/23/2014 Pathology Results    Pathology [phone]) found mixed high grade carcinoma (serous 50% and endometrioid 50%) FIGO grade 3, involving inner half of myometrium with depth 5 mm where wall 3 cm thick, no serosal or lower uterine segment involvement, no cervical or adnexal involvement, LVSI present, 0/15 nodes involved    07/30/2014 PET scan    PET/ CT at [facility] 07-30-14 had no findings of concern for distant disease    08/16/2014 Procedure     Ultrasound and fluoroscopically guided right internal jugular single lumen power port catheter insertion. Tip in the SVC/RA junction. Catheter ready for use.    08/24/2014 - 12/06/2014 Chemotherapy    She received 6 cycles of carbo/Taxol    11/29/2014 Genetic Testing    Genetics testing normal 11-2014 (comprehensive cancer panel by [facility])    12/17/2014 - 01/03/2015 Radiation Therapy    Radiation treatment dates:   12/17/2014, 12/22/2014, 12/27/2014, 12/31/2014, 01/03/2015  Site/dose:   Proximal 4.5 cm of Vaginal cuff / 30 Gy in 5 fractions Rx to 0.1cm submucosal depth  Beams/energy:   HDR brachytherapy/ Ir 192    03/03/2015 Imaging    Interval hysterectomy. No evidence of local recurrence or metastatic disease. 2. No acute abdominal pelvic findings. 3. Distal colonic diverticulosis with possible rectal wall thickening, possibly related to prior radiation therapy. Correlate clinically.    11/20/2017 Imaging    C/O vaginal pain CT A/P [facility] IMPRESSION: 1. No acute process or explanation for pelvic pain. 2. Hysterectomy. No evidence of pelvic metastasis.     INTERVAL History  Doing well.   No vaginal bleeding,  , no abdominal bloating shortness of breath, has changed her diet and lost a significant amount of weight, has more energy and is traveling extensively, is very happy  Past Medical History:  Past Medical History   Past Medical History   Diagnosis  Date   .  Asthma    .  Morbid obesity ([facility])      BMI 44   .  Hypertension    .  GERD (gastroesophageal reflux disease)    .  Arthritis    .  Depression    .  Back pain    .  Thyroid disease  05/2013   .  Vertigo  10/2012     benign paroxysmal positional       Past Surgical History:  Past Surgical History   Past Surgical History   Procedure  Laterality  Date   .  Dilation and curettage of uterus   2102     for PMB   .  Hysteroscopy   2012     with resection of fibroids   .  Tubal ligation     .  Laparoscopic  cholecystectomy     .  Pr laparoscopy w tot hysterectuterus <=250 gram w tube/ovary  Midline  06/23/2014     Procedure: ROBOTIC LAPAROSCOPY, SURGICAL, WITH TOTAL HYSTERECTOMY, FOR UTERUS 250 G OR LESS; W/REMOVAL TUBE(S) AND/OR OVARY(S); Surgeon: Devra Dopp, MD; Location: MAIN OR [facility]; Service: Gynecology Oncology   .  Pr lap,pelvic lymphadenectomy/bx  Bilateral  06/23/2014     Procedure: ROBOTIC LAPAROSCOPY, SURGICAL; WITH BILATERAL TOTAL PELVIC LYMPHADENECTOMY PERI-AORTIC LYMPH NODE SAMPLE, SGL/MULT; Surgeon: Devra Dopp, MD; Location: MAIN OR [facility]; Service: Gynecology Oncology      Colonoscopy 10/2016 with removal of adenomatous polyps.    Family History:  Family History   Family History   Problem  Relation  Age of Onset   .  Heart disease  Maternal Aunt    .  Lung cancer  Father    .  Cancer  Maternal Aunt  95     Breast   .  Cancer  Maternal Aunt  68     Breast   .  Diabetes  Mother    .  Lupus  Mother    .  Diabetes  Father    .  Prostate cancer  Father    .  Diabetes  Brother    .  Heart disease  Brother      ROS: No visual changes, no nausea or vomiting, no sensory or motor changes, no flank pain, no extremity edema, no incontinence, no vaginal , rectal or bladder bleeding.   No change in bowel or bladder habits, exercising more, reports weight loss for the past 6 months, has changed her diet, otherwise 10 point ROS negative.   Physical Exam: BP 114/76 (BP Location: Left Arm, Patient Position: Sitting)   Pulse 95   Temp 98.4 F (36.9 C) (Oral)   Resp 18   Ht 5' 1.5" (1.562 m)   Wt 213 lb (96.6 kg)   SpO2 99%   BMI 39.59 kg/m   Wt Readings from Last 3 Encounters:  09/25/18 213 lb (96.6 kg)  09/08/18 213 lb (96.6 kg)  08/29/18 218 lb (98.9 kg)   WD female in NAD Chest: CTA   Port-A-Cath removed  Back: No CVAT Abdomen: Obese. Soft, nondistended, nontender to palpation. No masses or hepatosplenomegaly appreciated. Port sites intact, no tenderness or masses   Pelvic:  Nl EGBUS, no massess, no discharge no bleeding, atropic vagina.  No cul de sac masses. No vaginal masses or tenderness,  Rectal:  Good tone no masses. Back:  No CVAT LN:  No cervical supraclavicular or inguinal adenopathy Ext No CCE

## 2018-09-25 NOTE — Patient Instructions (Signed)
Dr. Skeet Latch will see you in 6 months for follow up. Please call at the end of May [phone] to schedule your appointment for September.

## 2018-09-26 ENCOUNTER — Encounter: Payer: Self-pay | Admitting: Physical Therapy

## 2018-09-26 ENCOUNTER — Ambulatory Visit: Payer: Medicare PPO | Admitting: Physical Therapy

## 2018-09-26 DIAGNOSIS — M542 Cervicalgia: Secondary | ICD-10-CM

## 2018-09-26 DIAGNOSIS — M25612 Stiffness of left shoulder, not elsewhere classified: Secondary | ICD-10-CM | POA: Diagnosis not present

## 2018-09-26 DIAGNOSIS — M25512 Pain in left shoulder: Secondary | ICD-10-CM | POA: Diagnosis not present

## 2018-09-26 DIAGNOSIS — G8929 Other chronic pain: Secondary | ICD-10-CM

## 2018-09-26 DIAGNOSIS — M6281 Muscle weakness (generalized): Secondary | ICD-10-CM | POA: Diagnosis not present

## 2018-09-26 DIAGNOSIS — R293 Abnormal posture: Secondary | ICD-10-CM

## 2018-09-26 NOTE — Therapy (Signed)
[facility] [address]  [place], [place], [address] Phone: [phone]   Fax:  [phone]  Physical Therapy Treatment  Patient Details  Name: Katelyn Fisher MRN: [id number] Date of Birth: Oct 25, 1950 Referring Provider (PT): Riki Sheer, MD   Encounter Date: 09/26/2018  PT End of Session - 09/26/18 0926    Visit Number  2    Number of Visits  13    Date for PT Re-Evaluation  11/03/18    Authorization Type  [employer] & [place]    PT Start Time  [id number]    PT Stop Time  0924    PT Time Calculation (min)  42 min    Activity Tolerance  Patient tolerated treatment well    Behavior During Therapy  [facility] for tasks assessed/performed       Past Medical History:  Diagnosis Date  . Anxiety   . Arthritis    back  . Asthma   . Benign paroxysmal positional vertigo 11/01/2012  . Cancer ([place]) 12/22/2014   endometrial  . Depression   . Endometrial cancer ([place]) 2015  . Family history of breast cancer   . Family history of colon cancer   . Family history of ovarian cancer   . Family history of prostate cancer   . GERD (gastroesophageal reflux disease)   . History of colonic diverticulitis   . Hx of colonic polyps   . Hyperlipidemia   . Hypertension   . 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  . Low back pain radiating to right leg 08/18/2012  . Obesity   . Osteopenia 09/2017   T score -1.2 FRAX 3.2% / 0.3%  . Personal history of chemotherapy 2015  . Radiation 12/17/14-01/03/15   vaginal brachytherapy  . Thyroid disease 06/01/2013    Past Surgical History:  Procedure Laterality Date  . ABDOMINAL HYSTERECTOMY  06/23/14   [facility], TRH/BSO  . CHOLECYSTECTOMY  1991  . DILATION AND CURETTAGE OF UTERUS    . IR REMOVAL TUN ACCESS W/ PORT W/O FL MOD SED  04/30/2017  . TUBAL LIGATION      There were no vitals filed for this visit.  Subjective Assessment - 09/26/18 0843    Subjective  Reports that her pain calms down  and acts back up. Was able to calm down her pain this AM with her HEP. Believes her initial injury was d/t hitting her L arm on a doorknob.     Pertinent History  thyroid disease, hx of endometrial CA treated with chemo, osteopenia, LBP HTN, HLD, GERD, BPPV, asthma    Diagnostic tests  09/09/18 cervical xray: Cervical spondylosis and loss of lordosis without acute osseous findings    Patient Stated Goals  "i want to get rid of this pain"    Currently in Pain?  Yes    Pain Score  5     Pain Location  Neck    Pain Orientation  Left    Pain Descriptors / Indicators  Aching    Pain Type  Chronic pain    Pain Radiating Towards  down to arm                       [facility] Adult PT Treatment/Exercise - 09/26/18 0001      Exercises   Exercises  Neck;Shoulder      Neck Exercises: Seated   Neck Retraction  10 reps;3 secs    Neck Retraction Limitations  with light  manual pressure from PT    Other Seated Exercise  B scapular retraction 10x3"      Shoulder Exercises: Supine   External Rotation  AAROM;Left;10 reps    External Rotation Limitations  with wand; to tolerance    Flexion  AAROM;Left;10 reps    Flexion Limitations  with wand; to tolerance    ABduction  AAROM;Left;10 reps    ABduction Limitations  with wand; to tolerance      Shoulder Exercises: Sidelying   External Rotation  Strengthening;Left;10 reps;Weights    External Rotation Weight (lbs)  0, 1    External Rotation Limitations  2x10; dowel under elbow; cues to maintain elbow at 90deg    ABduction  Strengthening;Left;10 reps    ABduction Limitations  thumb up; cues to roll hips forward      Shoulder Exercises: Standing   Row  Strengthening;Both;15 reps;Theraband    Theraband Level (Shoulder Row)  Level 2 (Red)    Row Limitations  manual resistance required to keep shoulders down      Shoulder Exercises: Pulleys   Flexion  3 minutes    Flexion Limitations  to tolerance    Scaption  3 minutes    Scaption  Limitations  to tolerance      Shoulder Exercises: Stretch   Corner Stretch  2 reps;30 seconds    Corner Stretch Limitations  mid pec stretch in doorway   could not tolerate at 90 deg d/t superior shoudler pain     Neck Exercises: Stretches   Upper Trapezius Stretch  Right;Left;2 reps;30 seconds    Upper Trapezius Stretch Limitations  cues to maintain for 30 sec and avoid pushing into pain    Levator Stretch  Right;Left;2 reps;30 seconds    Levator Stretch Limitations  to tolerance             PT Education - 09/26/18 0926    Education Details  update to HEP    Person(s) Educated  Patient    Methods  Explanation;Demonstration;Tactile cues;Verbal cues;Handout    Comprehension  Verbalized understanding;Returned demonstration       PT Short Term Goals - 09/26/18 6160      PT SHORT TERM GOAL #1   Title  Patient to be independent with initial HEP.    Time  3    Period  Weeks    Status  On-going    Target Date  10/13/18        PT Long Term Goals - 09/26/18 7371      PT LONG TERM GOAL #1   Title  Patient to be independent with advanced HEP.    Time  6    Period  Weeks    Status  On-going      PT LONG TERM GOAL #2   Title  Patient to demonstrate L shoulder strength >=4+/5.    Time  6    Period  Weeks    Status  On-going      PT LONG TERM GOAL #3   Title  Patient to demonstrate L shoulder and cervical AROM WFL and without pain limiting.     Time  6    Period  Weeks    Status  On-going      PT LONG TERM GOAL #4   Title  Patient to report tolerance of using L hand for sweeping without pain.     Time  6    Period  Weeks    Status  On-going  PT LONG TERM GOAL #5   Title  Patient to report 80% improvement in L shoulder and neck pain.    Time  6    Period  Weeks    Status  On-going            Plan - 09/26/18 8657    Clinical Impression Statement  Patient arrived to session with no new complaints. Reviewed HEP with patient demonstrating good effort  with cervical retractions. Tolerated light manual pressure at end range, with patient reporting "that feels good." Worked on L shoulder AAROM with wand with cues to avoid pushing into pain. Introduced sidelying ER and abduction with slight cues to correct form- patient with good tolerance. Introduced scapular row with manual cues required to correct shoulder hiking. Updated HEP with exercises that were well-tolerated today. Patient without complaints at end of session.     Comorbidities  thyroid disease, hx of endometrial CA treated with chemo, osteopenia, LBP HTN, HLD, GERD, BPPV, asthma    PT Treatment/Interventions  ADLs/Self Care Home Management;Cryotherapy;Electrical Stimulation;Iontophoresis 4mg /ml Dexamethasone;Functional mobility training;Ultrasound;Moist Heat;Therapeutic activities;Therapeutic exercise;Neuromuscular re-education;Patient/family education;Passive range of motion;Manual techniques;Dry needling;Energy conservation;Splinting;Taping;Vasopneumatic Device    PT Next Visit Plan  progress periscapular strengthening     Consulted and Agree with Plan of Care  Patient       Patient will benefit from skilled therapeutic intervention in order to improve the following deficits and impairments:     Visit Diagnosis: Chronic left shoulder pain  Stiffness of left shoulder, not elsewhere classified  Cervicalgia  Muscle weakness (generalized)  Abnormal posture     Problem List Patient Active Problem List   Diagnosis Date Noted  . Moderate persistent asthma without complication 84/69/6295  . Current use of beta blocker 06/30/2018  . Allergic rhinitis 06/30/2018  . Acute bronchitis due to other specified organisms 06/30/2018  . History of endometrial cancer 02/03/2018  . Screening Mammogram 11/05/2017  . 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  . Type 2 diabetes mellitus with diabetic neuropathy, without long-term current use of insulin ([place]) 10/25/2016  .  Microcytosis 09/28/2016  . Mild intermittent asthma without complication 28/41/3244  . Port catheter in place 11/15/2015  . Chemotherapy induced neutropenia ([place]) 11/29/2014  . Family history of breast cancer   . Family history of ovarian cancer   . Family history of prostate cancer   . Family history of colon cancer   . Morbid obesity with body mass index of 40.0-44.9 in adult [facility]) 11/09/2014  . Chemotherapy-induced peripheral neuropathy ([place]) 09/22/2014  . Endometrial cancer ([place]) 05/20/2014  . 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  . Thyroid disease 06/01/2013  . Low back pain radiating to right leg 08/18/2012  . Anemia 11/07/2011  . Hyperlipidemia 06/15/2011  . Diabetes mellitus type 2 in obese ([place]) 03/12/2011  . Chronic pain of right knee 01/27/2011  . Obstructive sleep apnea 11/15/2010  . Osteoarthritis 11/15/2010  . Gastroesophageal reflux disease without esophagitis 09/14/2010  . DIVERTICULITIS, HX OF 09/14/2010  . POSTMENOPAUSAL STATUS 10/01/2008  . COLONIC POLYPS, HX OF 03/10/2008  . Essential hypertension 08/13/2006     Janene Harvey, PT, DPT 09/26/18 9:29 AM   [facility] [address]  [place], [place], [address] Phone: [phone]   Fax:  [phone]  Name: KYNSLIE RINGLE MRN: [id number] Date of Birth: [birth year]

## 2018-09-29 ENCOUNTER — Encounter: Payer: Self-pay | Admitting: Physical Therapy

## 2018-09-29 ENCOUNTER — Ambulatory Visit: Payer: Medicare PPO | Admitting: Physical Therapy

## 2018-09-29 DIAGNOSIS — M25612 Stiffness of left shoulder, not elsewhere classified: Secondary | ICD-10-CM

## 2018-09-29 DIAGNOSIS — R293 Abnormal posture: Secondary | ICD-10-CM | POA: Diagnosis not present

## 2018-09-29 DIAGNOSIS — M542 Cervicalgia: Secondary | ICD-10-CM

## 2018-09-29 DIAGNOSIS — G8929 Other chronic pain: Secondary | ICD-10-CM | POA: Diagnosis not present

## 2018-09-29 DIAGNOSIS — M6281 Muscle weakness (generalized): Secondary | ICD-10-CM | POA: Diagnosis not present

## 2018-09-29 DIAGNOSIS — M25512 Pain in left shoulder: Principal | ICD-10-CM

## 2018-09-29 NOTE — Therapy (Signed)
[facility] [address]  [place], [place], [address] Phone: [phone]   Fax:  [phone]  Physical Therapy Treatment  Patient Details  Name: Katelyn Fisher MRN: [id number] Date of Birth: May 09, 1951 Referring Provider (PT): Riki Sheer, MD   Encounter Date: 09/29/2018  PT End of Session - 09/29/18 0929    Visit Number  3    Number of Visits  13    Date for PT Re-Evaluation  11/03/18    Authorization Type  [employer] & [place]    PT Start Time  [phone]    PT Stop Time  0927    PT Time Calculation (min)  41 min    Activity Tolerance  Patient tolerated treatment well    Behavior During Therapy  [facility] for tasks assessed/performed       Past Medical History:  Diagnosis Date  . Anxiety   . Arthritis    back  . Asthma   . Benign paroxysmal positional vertigo 11/01/2012  . Cancer ([place]) 12/22/2014   endometrial  . Depression   . Endometrial cancer ([place]) 2015  . Family history of breast cancer   . Family history of colon cancer   . Family history of ovarian cancer   . Family history of prostate cancer   . GERD (gastroesophageal reflux disease)   . History of colonic diverticulitis   . Hx of colonic polyps   . Hyperlipidemia   . Hypertension   . 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  . Low back pain radiating to right leg 08/18/2012  . Obesity   . Osteopenia 09/2017   T score -1.2 FRAX 3.2% / 0.3%  . Personal history of chemotherapy 2015  . Radiation 12/17/14-01/03/15   vaginal brachytherapy  . Thyroid disease 06/01/2013    Past Surgical History:  Procedure Laterality Date  . ABDOMINAL HYSTERECTOMY  06/23/14   [facility], TRH/BSO  . CHOLECYSTECTOMY  1991  . DILATION AND CURETTAGE OF UTERUS    . IR REMOVAL TUN ACCESS W/ PORT W/O FL MOD SED  04/30/2017  . TUBAL LIGATION      There were no vitals filed for this visit.  Subjective Assessment - 09/29/18 0848    Subjective  Reports that she has had a  little bit of muscle soreness and pain since last session- had to take some tylenol saturday, Was able to get a good night's rest last night.     Pertinent History  thyroid disease, hx of endometrial CA treated with chemo, osteopenia, LBP HTN, HLD, GERD, BPPV, asthma    Diagnostic tests  09/09/18 cervical xray: Cervical spondylosis and loss of lordosis without acute osseous findings    Patient Stated Goals  "i want to get rid of this pain"    Currently in Pain?  Yes    Pain Score  6     Pain Location  Neck    Pain Orientation  Left    Pain Descriptors / Indicators  Aching    Pain Type  Chronic pain                       [facility] Adult PT Treatment/Exercise - 09/29/18 0001      Neck Exercises: Seated   Neck Retraction  10 reps;3 secs    Neck Retraction Limitations  2x10; 2nd set with yellow TB   cues to maintain position for 3 sec     Shoulder Exercises: Seated   Flexion  AAROM;Left;10 reps    Flexion Limitations  flexion rollouts with orange pball to tolerance    Abduction  AAROM;Left;10 reps    ABduction Limitations  abduction rollouts with orange pball to tolerance      Shoulder Exercises: Sidelying   External Rotation  Strengthening;Left;10 reps;Weights    External Rotation Weight (lbs)  1    External Rotation Limitations  dowel under elbow   good carryover of form   ABduction  Strengthening;Left;10 reps    ABduction Weight (lbs)  1    ABduction Limitations  thumb up; to tolerance      Shoulder Exercises: Standing   External Rotation  Strengthening;Left;10 reps;Theraband    Theraband Level (Shoulder External Rotation)  Level 1 (Yellow)    External Rotation Limitations  ER stepouts with yellow TB and dowel under elbow    Internal Rotation  Strengthening;Left;10 reps;Theraband    Theraband Level (Shoulder Internal Rotation)  Level 1 (Yellow)    Internal Rotation Limitations  IR stepouts with yellow TB and dowel under elbow    Row  Strengthening;Both;15  reps;Theraband    Theraband Level (Shoulder Row)  Level 2 (Red)    Row Limitations  cues for scap retraction and bent elbows      Shoulder Exercises: Pulleys   Flexion  3 minutes    Flexion Limitations  to tolerance    Scaption  3 minutes    Scaption Limitations  to tolerance      Shoulder Exercises: Stretch   Corner Stretch  2 reps;30 seconds    Corner Stretch Limitations  90/90 pec stretch in doorway      Neck Exercises: Stretches   Upper Trapezius Stretch  Right;Left;1 rep;30 seconds    Upper Trapezius Stretch Limitations  to tolerance    Levator Stretch  Right;Left;1 rep;30 seconds    Levator Stretch Limitations  to tolerance               PT Short Term Goals - 09/29/18 0929      PT SHORT TERM GOAL #1   Title  Patient to be independent with initial HEP.    Time  3    Period  Weeks    Status  Achieved    Target Date  10/13/18        PT Long Term Goals - 09/26/18 1914      PT LONG TERM GOAL #1   Title  Patient to be independent with advanced HEP.    Time  6    Period  Weeks    Status  On-going      PT LONG TERM GOAL #2   Title  Patient to demonstrate L shoulder strength >=4+/5.    Time  6    Period  Weeks    Status  On-going      PT LONG TERM GOAL #3   Title  Patient to demonstrate L shoulder and cervical AROM WFL and without pain limiting.     Time  6    Period  Weeks    Status  On-going      PT LONG TERM GOAL #4   Title  Patient to report tolerance of using L hand for sweeping without pain.     Time  6    Period  Weeks    Status  On-going      PT LONG TERM GOAL #5   Title  Patient to report 80% improvement in L shoulder and neck pain.    Time  6    Period  Weeks    Status  On-going            Plan - 09/29/18 0929    Clinical Impression Statement  Patient arrived to session with report of some soreness in L neck and shoulder, but was able to sleep all night last night without pain. Progressed cervical retractions this date with  increased difficulty with addition of banded resistance. Introduced L shoulder AAROM with physioball with good tolerance by patient. Good carryover of scapular rows with minor cues for form. Tolerated addition of L shoulder IR/ER stepouts with good form. Patient tolerated this session well. Reported slight L hand N/T at end of session, however patient reporting that she believes is it d/t her neuropathy.     Comorbidities  thyroid disease, hx of endometrial CA treated with chemo, osteopenia, LBP HTN, HLD, GERD, BPPV, asthma    PT Treatment/Interventions  ADLs/Self Care Home Management;Cryotherapy;Electrical Stimulation;Iontophoresis 4mg /ml Dexamethasone;Functional mobility training;Ultrasound;Moist Heat;Therapeutic activities;Therapeutic exercise;Neuromuscular re-education;Patient/family education;Passive range of motion;Manual techniques;Dry needling;Energy conservation;Splinting;Taping;Vasopneumatic Device    PT Next Visit Plan  progress periscapular strengthening     Consulted and Agree with Plan of Care  Patient       Patient will benefit from skilled therapeutic intervention in order to improve the following deficits and impairments:     Visit Diagnosis: Chronic left shoulder pain  Stiffness of left shoulder, not elsewhere classified  Cervicalgia  Muscle weakness (generalized)  Abnormal posture     Problem List Patient Active Problem List   Diagnosis Date Noted  . Moderate persistent asthma without complication 01/74/9449  . Current use of beta blocker 06/30/2018  . Allergic rhinitis 06/30/2018  . Acute bronchitis due to other specified organisms 06/30/2018  . History of endometrial cancer 02/03/2018  . Screening Mammogram 11/05/2017  . 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  . Type 2 diabetes mellitus with diabetic neuropathy, without long-term current use of insulin ([place]) 10/25/2016  . Microcytosis 09/28/2016  . Mild intermittent asthma without  complication 67/59/1638  . Port catheter in place 11/15/2015  . Chemotherapy induced neutropenia ([place]) 11/29/2014  . Family history of breast cancer   . Family history of ovarian cancer   . Family history of prostate cancer   . Family history of colon cancer   . Morbid obesity with body mass index of 40.0-44.9 in adult [facility]) 11/09/2014  . Chemotherapy-induced peripheral neuropathy ([place]) 09/22/2014  . Endometrial cancer ([place]) 05/20/2014  . 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  . Thyroid disease 06/01/2013  . Low back pain radiating to right leg 08/18/2012  . Anemia 11/07/2011  . Hyperlipidemia 06/15/2011  . Diabetes mellitus type 2 in obese ([place]) 03/12/2011  . Chronic pain of right knee 01/27/2011  . Obstructive sleep apnea 11/15/2010  . Osteoarthritis 11/15/2010  . Gastroesophageal reflux disease without esophagitis 09/14/2010  . DIVERTICULITIS, HX OF 09/14/2010  . POSTMENOPAUSAL STATUS 10/01/2008  . COLONIC POLYPS, HX OF 03/10/2008  . Essential hypertension 08/13/2006    Janene Harvey, PT, DPT 09/29/18 9:30 AM    [facility] [address]  [place], [place], [address] Phone: [phone]   Fax:  [phone]  Name: Katelyn Fisher MRN: [id number] Date of Birth: 06-16-1951

## 2018-09-30 ENCOUNTER — Ambulatory Visit: Payer: Medicare PPO | Admitting: *Deleted

## 2018-10-02 ENCOUNTER — Encounter: Payer: Self-pay | Admitting: Physical Therapy

## 2018-10-02 ENCOUNTER — Other Ambulatory Visit: Payer: Self-pay

## 2018-10-02 ENCOUNTER — Ambulatory Visit: Payer: Medicare PPO | Admitting: Physical Therapy

## 2018-10-02 DIAGNOSIS — M6281 Muscle weakness (generalized): Secondary | ICD-10-CM

## 2018-10-02 DIAGNOSIS — M25612 Stiffness of left shoulder, not elsewhere classified: Secondary | ICD-10-CM

## 2018-10-02 DIAGNOSIS — R293 Abnormal posture: Secondary | ICD-10-CM

## 2018-10-02 DIAGNOSIS — Z23 Encounter for immunization: Secondary | ICD-10-CM | POA: Diagnosis not present

## 2018-10-02 DIAGNOSIS — M542 Cervicalgia: Secondary | ICD-10-CM | POA: Diagnosis not present

## 2018-10-02 DIAGNOSIS — G8929 Other chronic pain: Secondary | ICD-10-CM | POA: Diagnosis not present

## 2018-10-02 DIAGNOSIS — M25512 Pain in left shoulder: Secondary | ICD-10-CM | POA: Diagnosis not present

## 2018-10-02 DIAGNOSIS — D239 Other benign neoplasm of skin, unspecified: Secondary | ICD-10-CM | POA: Diagnosis not present

## 2018-10-02 DIAGNOSIS — I8310 Varicose veins of unspecified lower extremity with inflammation: Secondary | ICD-10-CM | POA: Diagnosis not present

## 2018-10-02 DIAGNOSIS — L821 Other seborrheic keratosis: Secondary | ICD-10-CM | POA: Diagnosis not present

## 2018-10-02 NOTE — Therapy (Signed)
[facility] [address]  [place], [place], [address] Phone: [phone]   Fax:  [phone]  Physical Therapy Treatment  Patient Details  Name: Katelyn Fisher MRN: [id number] Date of Birth: 09-18-1950 Referring Provider (PT): Riki Sheer, MD   Encounter Date: 10/02/2018  PT End of Session - 10/02/18 1447    Visit Number  4    Number of Visits  13    Date for PT Re-Evaluation  11/03/18    Authorization Type  [employer] & [facility]    PT Start Time  7793    PT Stop Time  1528    PT Time Calculation (min)  41 min    Activity Tolerance  Patient tolerated treatment well    Behavior During Therapy  [facility] for tasks assessed/performed       Past Medical History:  Diagnosis Date  . Anxiety   . Arthritis    back  . Asthma   . Benign paroxysmal positional vertigo 11/01/2012  . Cancer ([place]) 12/22/2014   endometrial  . Depression   . Endometrial cancer ([place]) 2015  . Family history of breast cancer   . Family history of colon cancer   . Family history of ovarian cancer   . Family history of prostate cancer   . GERD (gastroesophageal reflux disease)   . History of colonic diverticulitis   . Hx of colonic polyps   . Hyperlipidemia   . Hypertension   . 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  . Low back pain radiating to right leg 08/18/2012  . Obesity   . Osteopenia 09/2017   T score -1.2 FRAX 3.2% / 0.3%  . Personal history of chemotherapy 2015  . Radiation 12/17/14-01/03/15   vaginal brachytherapy  . Thyroid disease 06/01/2013    Past Surgical History:  Procedure Laterality Date  . ABDOMINAL HYSTERECTOMY  06/23/14   [facility], TRH/BSO  . CHOLECYSTECTOMY  1991  . DILATION AND CURETTAGE OF UTERUS    . IR REMOVAL TUN ACCESS W/ PORT W/O FL MOD SED  04/30/2017  . TUBAL LIGATION      There were no vitals filed for this visit.  Subjective Assessment - 10/02/18 1450    Subjective  Pt reports she feels like she  has more pain when sitting at her desk at the computer.    Pertinent History  thyroid disease, hx of endometrial CA treated with chemo, osteopenia, LBP HTN, HLD, GERD, BPPV, asthma    Diagnostic tests  09/09/18 cervical xray: Cervical spondylosis and loss of lordosis without acute osseous findings    Patient Stated Goals  "i want to get rid of this pain"    Currently in Pain?  Yes    Pain Score  7     Pain Location  Shoulder    Pain Orientation  Left    Pain Type  Chronic pain    Pain Frequency  Intermittent    Pain Score  4    Pain Location  Neck    Pain Orientation  Left    Pain Type  Acute pain    Pain Frequency  Intermittent                       [facility] Adult PT Treatment/Exercise - 10/02/18 1447      Self-Care   Self-Care  Posture    Posture  Provided education in proper posture & body mechanics for typical daily work and household tasks -  pt noting several areas for improvement within her normal routine.      Exercises   Exercises  Neck;Shoulder      Shoulder Exercises: Seated   Horizontal ABduction  Both;15 reps;Theraband;Strengthening    Theraband Level (Shoulder Horizontal ABduction)  Level 1 (Yellow)    Horizontal ABduction Limitations  cues for scap retraction & depression    External Rotation  Both;15 reps;Theraband;Strengthening    Theraband Level (Shoulder External Rotation)  Level 1 (Yellow)    External Rotation Limitations  elbows at sides + scap retraction & depression    Flexion  AAROM;Left;10 reps    Flexion Limitations  flexion rollouts with orange pball + L UE lift off at end range    Diagonals  Right;Left;10 reps;Theraband;Strengthening    Diagonals Limitations  D2 flexion + scap retraction      Shoulder Exercises: Pulleys   Flexion  3 minutes    Flexion Limitations  to tolerance    Scaption  3 minutes    Scaption Limitations  to tolerance               PT Short Term Goals - 09/29/18 0929      PT SHORT TERM GOAL #1   Title   Patient to be independent with initial HEP.    Time  3    Period  Weeks    Status  Achieved    Target Date  10/13/18        PT Long Term Goals - 09/26/18 7062      PT LONG TERM GOAL #1   Title  Patient to be independent with advanced HEP.    Time  6    Period  Weeks    Status  On-going      PT LONG TERM GOAL #2   Title  Patient to demonstrate L shoulder strength >=4+/5.    Time  6    Period  Weeks    Status  On-going      PT LONG TERM GOAL #3   Title  Patient to demonstrate L shoulder and cervical AROM WFL and without pain limiting.     Time  6    Period  Weeks    Status  On-going      PT LONG TERM GOAL #4   Title  Patient to report tolerance of using L hand for sweeping without pain.     Time  6    Period  Weeks    Status  On-going      PT LONG TERM GOAL #5   Title  Patient to report 80% improvement in L shoulder and neck pain.    Time  6    Period  Weeks    Status  On-going            Plan - 10/02/18 1454    Clinical Impression Statement  Lyndsey mentioning that her pain is typically worse when sitting at her computer at work, therefore provided instruction in proper posture and body mechanics with typical daily task emphasizing work station set-up - pt reporting several areas where she has potential for improvement. Progressed scapular stabilzation and strengthening in conjunction with posterior shoulder strengthening with good tolerance but need to repeated cues to ensure proper movement patterns.    Comorbidities  thyroid disease, hx of endometrial CA treated with chemo, osteopenia, LBP HTN, HLD, GERD, BPPV, asthma    Rehab Potential  Good    PT Treatment/Interventions  ADLs/Self Care Home Management;Cryotherapy;Electrical Stimulation;Iontophoresis 4mg /ml  Dexamethasone;Functional mobility training;Ultrasound;Moist Heat;Therapeutic activities;Therapeutic exercise;Neuromuscular re-education;Patient/family education;Passive range of motion;Manual techniques;Dry  needling;Energy conservation;Splinting;Taping;Vasopneumatic Device    PT Next Visit Plan  progress periscapular strengthening     Consulted and Agree with Plan of Care  Patient       Patient will benefit from skilled therapeutic intervention in order to improve the following deficits and impairments:  Decreased activity tolerance, Decreased strength, Impaired UE functional use, Pain, Improper body mechanics, Decreased range of motion, Impaired flexibility, Postural dysfunction  Visit Diagnosis: Chronic left shoulder pain  Stiffness of left shoulder, not elsewhere classified  Cervicalgia  Muscle weakness (generalized)  Abnormal posture     Problem List Patient Active Problem List   Diagnosis Date Noted  . Moderate persistent asthma without complication 01/75/1025  . Current use of beta blocker 06/30/2018  . Allergic rhinitis 06/30/2018  . Acute bronchitis due to other specified organisms 06/30/2018  . History of endometrial cancer 02/03/2018  . Screening Mammogram 11/05/2017  . 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  . Type 2 diabetes mellitus with diabetic neuropathy, without long-term current use of insulin ([place]) 10/25/2016  . Microcytosis 09/28/2016  . Mild intermittent asthma without complication 85/27/7824  . Port catheter in place 11/15/2015  . Chemotherapy induced neutropenia ([place]) 11/29/2014  . Family history of breast cancer   . Family history of ovarian cancer   . Family history of prostate cancer   . Family history of colon cancer   . Morbid obesity with body mass index of 40.0-44.9 in adult [facility]) 11/09/2014  . Chemotherapy-induced peripheral neuropathy ([place]) 09/22/2014  . Endometrial cancer ([place]) 05/20/2014  . 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  . Thyroid disease 06/01/2013  . Low back pain radiating to right leg 08/18/2012  . Anemia 11/07/2011  . Hyperlipidemia 06/15/2011  . Diabetes mellitus type 2 in obese ([place]) 03/12/2011  . Chronic  pain of right knee 01/27/2011  . Obstructive sleep apnea 11/15/2010  . Osteoarthritis 11/15/2010  . Gastroesophageal reflux disease without esophagitis 09/14/2010  . DIVERTICULITIS, HX OF 09/14/2010  . POSTMENOPAUSAL STATUS 10/01/2008  . COLONIC POLYPS, HX OF 03/10/2008  . Essential hypertension 08/13/2006    Percival Spanish, PT, MPT 10/02/2018, 4:45 PM  [facility] [address]  [place], [place], [address] Phone: [phone]   Fax:  [phone]  Name: HARTLEY WYKE MRN: [id number] Date of Birth: [birth year]

## 2018-10-02 NOTE — Patient Instructions (Signed)

## 2018-10-03 ENCOUNTER — Other Ambulatory Visit: Payer: Self-pay | Admitting: Family Medicine

## 2018-10-06 ENCOUNTER — Ambulatory Visit: Payer: Medicare PPO | Admitting: Physical Therapy

## 2018-10-06 ENCOUNTER — Other Ambulatory Visit: Payer: Self-pay

## 2018-10-06 ENCOUNTER — Encounter: Payer: Self-pay | Admitting: Physical Therapy

## 2018-10-06 DIAGNOSIS — G8929 Other chronic pain: Secondary | ICD-10-CM | POA: Diagnosis not present

## 2018-10-06 DIAGNOSIS — M25512 Pain in left shoulder: Principal | ICD-10-CM

## 2018-10-06 DIAGNOSIS — M542 Cervicalgia: Secondary | ICD-10-CM | POA: Diagnosis not present

## 2018-10-06 DIAGNOSIS — R293 Abnormal posture: Secondary | ICD-10-CM | POA: Diagnosis not present

## 2018-10-06 DIAGNOSIS — M25612 Stiffness of left shoulder, not elsewhere classified: Secondary | ICD-10-CM

## 2018-10-06 DIAGNOSIS — M6281 Muscle weakness (generalized): Secondary | ICD-10-CM

## 2018-10-06 NOTE — Therapy (Signed)
[facility] [address]  [place], [place], [address] Phone: [phone]   Fax:  [phone]  Physical Therapy Treatment  Patient Details  Name: Katelyn Fisher MRN: [id number] Date of Birth: [birth year] Referring Provider (PT): Riki Sheer, MD   Encounter Date: 10/06/2018  PT End of Session - 10/06/18 0849    Visit Number  5    Number of Visits  13    Date for PT Re-Evaluation  11/03/18    Authorization Type  [employer] & [place]    PT Start Time  [phone]    PT Stop Time  0929    PT Time Calculation (min)  40 min    Activity Tolerance  Patient tolerated treatment well    Behavior During Therapy  [facility] for tasks assessed/performed       Past Medical History:  Diagnosis Date  . Anxiety   . Arthritis    back  . Asthma   . Benign paroxysmal positional vertigo 11/01/2012  . Cancer ([place]) 12/22/2014   endometrial  . Depression   . Endometrial cancer ([employer]) 2015  . Family history of breast cancer   . Family history of colon cancer   . Family history of ovarian cancer   . Family history of prostate cancer   . GERD (gastroesophageal reflux disease)   . History of colonic diverticulitis   . Hx of colonic polyps   . Hyperlipidemia   . Hypertension   . 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  . Low back pain radiating to right leg 08/18/2012  . Obesity   . Osteopenia 09/2017   T score -1.2 FRAX 3.2% / 0.3%  . Personal history of chemotherapy 2015  . Radiation 12/17/14-01/03/15   vaginal brachytherapy  . Thyroid disease 06/01/2013    Past Surgical History:  Procedure Laterality Date  . ABDOMINAL HYSTERECTOMY  06/23/14   [facility], TRH/BSO  . CHOLECYSTECTOMY  1991  . DILATION AND CURETTAGE OF UTERUS    . IR REMOVAL TUN ACCESS W/ PORT W/O FL MOD SED  04/30/2017  . TUBAL LIGATION      There were no vitals filed for this visit.  Subjective Assessment - 10/06/18 0851    Subjective  Pt noting improvement in  sleeping and daily hosehold tasks incorporating posture and body mechanics hints from last visit, reporting "no pain at present".    Pertinent History  thyroid disease, hx of endometrial CA treated with chemo, osteopenia, LBP HTN, HLD, GERD, BPPV, asthma    Diagnostic tests  09/09/18 cervical xray: Cervical spondylosis and loss of lordosis without acute osseous findings    Patient Stated Goals  "i want to get rid of this pain"    Currently in Pain?  No/denies                       [facility] Adult PT Treatment/Exercise - 10/06/18 0849      Neck Exercises: Machines for Strengthening   UBE (Upper Arm Bike)  L2.0 x 6 min (3' fwd/3' back)      Shoulder Exercises: Standing   Horizontal ABduction  Strengthening;Both;15 reps;Theraband    Theraband Level (Shoulder Horizontal ABduction)  Level 2 (Red)    Horizontal ABduction Limitations  standing against pool noodle on wall as cue for scap retraction    External Rotation  Strengthening;Both;15 reps;Theraband    Theraband Level (Shoulder External Rotation)  Level 2 (Red)    External Rotation Limitations  standing against  pool noodle on wall as cue for scap retraction    Extension  Strengthening;Both;15 reps;Theraband    Theraband Level (Shoulder Extension)  Level 2 (Red)    Row  Strengthening;Both;15 reps;Theraband    Theraband Level (Shoulder Row)  Level 2 (Red)    Row Limitations  cues for scap retraction & depression    Diagonals  Strengthening;Both;10 reps;Theraband    Theraband Level (Shoulder Diagonals)  Level 2 (Red)    Diagonals Limitations  standing against pool noodle on wall as cue for scap retraction      Shoulder Exercises: Therapy Ball   Flexion  Both;10 reps   2 sets   Flexion Limitations  orange Pball on wall; 2nd set + L hand lift-off with 1# cuff wt on wrist      Shoulder Exercises: ROM/Strengthening   Cybex Row  15 reps    Cybex Row Limitations  15# - narrow grip    Wall Pushups  10 reps    Wall Pushups  Limitations  pushup plus    Other ROM/Strengthening Exercises  Serratus roll-ups on 6" FR on wall x 15             PT Education - 10/06/18 0929    Education Details  HEP update - red TB scapular & shoulder strengthening    Person(s) Educated  Patient    Methods  Explanation;Demonstration;Handout    Comprehension  Verbalized understanding;Returned demonstration       PT Short Term Goals - 09/29/18 0929      PT SHORT TERM GOAL #1   Title  Patient to be independent with initial HEP.    Time  3    Period  Weeks    Status  Achieved    Target Date  10/13/18        PT Long Term Goals - 09/26/18 3267      PT LONG TERM GOAL #1   Title  Patient to be independent with advanced HEP.    Time  6    Period  Weeks    Status  On-going      PT LONG TERM GOAL #2   Title  Patient to demonstrate L shoulder strength >=4+/5.    Time  6    Period  Weeks    Status  On-going      PT LONG TERM GOAL #3   Title  Patient to demonstrate L shoulder and cervical AROM WFL and without pain limiting.     Time  6    Period  Weeks    Status  On-going      PT LONG TERM GOAL #4   Title  Patient to report tolerance of using L hand for sweeping without pain.     Time  6    Period  Weeks    Status  On-going      PT LONG TERM GOAL #5   Title  Patient to report 80% improvement in L shoulder and neck pain.    Time  6    Period  Weeks    Status  On-going            Plan - 10/06/18 0853    Clinical Impression Statement  Katelyn Fisher noting benefit from postural and body mechanics training from last visit, especially with sleeping position and working in the kitchen. Arrives to PT today pain free with good tolerance for progression of scapular strengthening and only minimal cues needed for proper technique (primarily to avoid shoulder hiking  on R), therefore HEP updated to include red TB resisted scapular strengthening.    Comorbidities  thyroid disease, hx of endometrial CA treated with chemo,  osteopenia, LBP HTN, HLD, GERD, BPPV, asthma    Rehab Potential  Good    PT Treatment/Interventions  ADLs/Self Care Home Management;Cryotherapy;Electrical Stimulation;Iontophoresis 4mg /ml Dexamethasone;Functional mobility training;Ultrasound;Moist Heat;Therapeutic activities;Therapeutic exercise;Neuromuscular re-education;Patient/family education;Passive range of motion;Manual techniques;Dry needling;Energy conservation;Splinting;Taping;Vasopneumatic Device    PT Next Visit Plan  progress periscapular strengthening     Consulted and Agree with Plan of Care  Patient       Patient will benefit from skilled therapeutic intervention in order to improve the following deficits and impairments:  Decreased activity tolerance, Decreased strength, Impaired UE functional use, Pain, Improper body mechanics, Decreased range of motion, Impaired flexibility, Postural dysfunction  Visit Diagnosis: Chronic left shoulder pain  Stiffness of left shoulder, not elsewhere classified  Cervicalgia  Muscle weakness (generalized)  Abnormal posture     Problem List Patient Active Problem List   Diagnosis Date Noted  . Moderate persistent asthma without complication 19/14/7829  . Current use of beta blocker 06/30/2018  . Allergic rhinitis 06/30/2018  . Acute bronchitis due to other specified organisms 06/30/2018  . History of endometrial cancer 02/03/2018  . Screening Mammogram 11/05/2017  . 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  . Type 2 diabetes mellitus with diabetic neuropathy, without long-term current use of insulin ([place]) 10/25/2016  . Microcytosis 09/28/2016  . Mild intermittent asthma without complication 56/21/3086  . Port catheter in place 11/15/2015  . Chemotherapy induced neutropenia ([place]) 11/29/2014  . Family history of breast cancer   . Family history of ovarian cancer   . Family history of prostate cancer   . Family history of colon cancer   . Morbid obesity with  body mass index of 40.0-44.9 in adult [facility]) 11/09/2014  . Chemotherapy-induced peripheral neuropathy ([place]) 09/22/2014  . Endometrial cancer ([place]) 05/20/2014  . 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  . Thyroid disease 06/01/2013  . Low back pain radiating to right leg 08/18/2012  . Anemia 11/07/2011  . Hyperlipidemia 06/15/2011  . Diabetes mellitus type 2 in obese ([place]) 03/12/2011  . Chronic pain of right knee 01/27/2011  . Obstructive sleep apnea 11/15/2010  . Osteoarthritis 11/15/2010  . Gastroesophageal reflux disease without esophagitis 09/14/2010  . DIVERTICULITIS, HX OF 09/14/2010  . POSTMENOPAUSAL STATUS 10/01/2008  . COLONIC POLYPS, HX OF 03/10/2008  . Essential hypertension 08/13/2006    Percival Spanish, PT, MPT 10/06/2018, 12:28 PM  [facility] [address]  [place], [place], [address] Phone: [phone]   Fax:  [phone]  Name: Katelyn Fisher MRN: [id number] Date of Birth: 07/30/1950

## 2018-10-07 DIAGNOSIS — H2513 Age-related nuclear cataract, bilateral: Secondary | ICD-10-CM | POA: Diagnosis not present

## 2018-10-07 DIAGNOSIS — H35033 Hypertensive retinopathy, bilateral: Secondary | ICD-10-CM | POA: Diagnosis not present

## 2018-10-08 ENCOUNTER — Other Ambulatory Visit: Payer: Self-pay

## 2018-10-08 ENCOUNTER — Ambulatory Visit: Payer: Medicare PPO | Admitting: Physical Therapy

## 2018-10-08 ENCOUNTER — Encounter: Payer: Self-pay | Admitting: Physical Therapy

## 2018-10-08 DIAGNOSIS — M25612 Stiffness of left shoulder, not elsewhere classified: Secondary | ICD-10-CM | POA: Diagnosis not present

## 2018-10-08 DIAGNOSIS — R293 Abnormal posture: Secondary | ICD-10-CM

## 2018-10-08 DIAGNOSIS — M25512 Pain in left shoulder: Secondary | ICD-10-CM | POA: Diagnosis not present

## 2018-10-08 DIAGNOSIS — G8929 Other chronic pain: Secondary | ICD-10-CM

## 2018-10-08 DIAGNOSIS — M6281 Muscle weakness (generalized): Secondary | ICD-10-CM | POA: Diagnosis not present

## 2018-10-08 DIAGNOSIS — M542 Cervicalgia: Secondary | ICD-10-CM

## 2018-10-08 NOTE — Therapy (Addendum)
[facility] [address]  [place], [place], [address] Phone: [phone]   Fax:  [phone]  Physical Therapy Treatment  Patient Details  Name: Katelyn Fisher MRN: [id number] Date of Birth: Sep 29, 1950 Referring Provider (PT): Katelyn Sheer, MD   Progress Note Reporting Period 09/22/18 to 10/08/18  See note below for Objective Data and Assessment of Progress/Goals.    Encounter Date: 10/08/2018  PT End of Session - 10/08/18 0924    Visit Number  6    Number of Visits  13    Date for PT Re-Evaluation  11/03/18    Authorization Type  [employer] & [place]    PT Start Time  [phone]    PT Stop Time  0930   moist heat   PT Time Calculation (min)  51 min    Activity Tolerance  Patient tolerated treatment well    Behavior During Therapy  [facility] for tasks assessed/performed       Past Medical History:  Diagnosis Date  . Anxiety   . Arthritis    back  . Asthma   . Benign paroxysmal positional vertigo 11/01/2012  . Cancer ([place]) 12/22/2014   endometrial  . Depression   . Endometrial cancer ([place]) 2015  . Family history of breast cancer   . Family history of colon cancer   . Family history of ovarian cancer   . Family history of prostate cancer   . GERD (gastroesophageal reflux disease)   . History of colonic diverticulitis   . Hx of colonic polyps   . Hyperlipidemia   . Hypertension   . 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  . Low back pain radiating to right leg 08/18/2012  . Obesity   . Osteopenia 09/2017   T score -1.2 FRAX 3.2% / 0.3%  . Personal history of chemotherapy 2015  . Radiation 12/17/14-01/03/15   vaginal brachytherapy  . Thyroid disease 06/01/2013    Past Surgical History:  Procedure Laterality Date  . ABDOMINAL HYSTERECTOMY  06/23/14   [facility], TRH/BSO  . CHOLECYSTECTOMY  1991  . DILATION AND CURETTAGE OF UTERUS    . IR REMOVAL TUN ACCESS W/ PORT W/O FL MOD SED  04/30/2017  . TUBAL  LIGATION      There were no vitals filed for this visit.  Subjective Assessment - 10/08/18 0838    Subjective  Reports she is doing okay. Has been trying to do her HEP but may be doing it wrong.     Pertinent History  thyroid disease, hx of endometrial CA treated with chemo, osteopenia, LBP HTN, HLD, GERD, BPPV, asthma    Diagnostic tests  09/09/18 cervical xray: Cervical spondylosis and loss of lordosis without acute osseous findings    Patient Stated Goals  "i want to get rid of this pain"    Currently in Pain?  Yes    Pain Score  4     Pain Location  Shoulder    Pain Orientation  Left    Pain Descriptors / Indicators  Aching    Pain Type  Chronic pain                       [facility] Adult PT Treatment/Exercise - 10/08/18 0001      Neck Exercises: Machines for Strengthening   UBE (Upper Arm Bike)  L2.0 x 6 min (3' fwd/3' back)      Neck Exercises: Prone   Neck Retraction  10 reps  Neck Retraction Limitations  10x3" prone on elbows cervical retraction    Other Prone Exercise  prone cervical retraction + scap retraction and B shoulder extension 10x3"   cues to tilt chin down     Shoulder Exercises: Seated   Horizontal ABduction  Both;Theraband;Strengthening;10 reps    Theraband Level (Shoulder Horizontal ABduction)  Level 2 (Red)    Horizontal ABduction Limitations  cues for scap retraction & depression    External Rotation  Both;Theraband;Strengthening;10 reps    Theraband Level (Shoulder External Rotation)  Level 2 (Red)    External Rotation Limitations  cues for scap retraction & depression      Shoulder Exercises: Standing   External Rotation  Strengthening;15 reps;Theraband;Left    Theraband Level (Shoulder External Rotation)  Level 2 (Red)    External Rotation Limitations  with dowel under elbow   cues to stop at neutral   Internal Rotation  Strengthening;Left;Theraband;15 reps    Theraband Level (Shoulder Internal Rotation)  Level 2 (Red)    Internal  Rotation Limitations  with dowel under elbow   cues to avoid trunk rotation   Extension  Strengthening;Both;15 reps;Theraband    Theraband Level (Shoulder Extension)  Level 2 (Red)    Extension Limitations  good form    Row  Strengthening;Both;15 reps;Theraband    Theraband Level (Shoulder Row)  Level 2 (Red)    Row Limitations  cues for scap depression    Other Standing Exercises  B Y lift off from wall x10    Other Standing Exercises  serratus pushups on wall x15      Modalities   Modalities  Moist Heat      Moist Heat Therapy   Number Minutes Moist Heat  10 Minutes    Moist Heat Location  Shoulder   L              PT Short Term Goals - 09/29/18 3810      PT SHORT TERM GOAL #1   Title  Patient to be independent with initial HEP.    Time  3    Period  Weeks    Status  Achieved    Target Date  10/13/18        PT Long Term Goals - 09/26/18 1751      PT LONG TERM GOAL #1   Title  Patient to be independent with advanced HEP.    Time  6    Period  Weeks    Status  On-going      PT LONG TERM GOAL #2   Title  Patient to demonstrate L shoulder strength >=4+/5.    Time  6    Period  Weeks    Status  On-going      PT LONG TERM GOAL #3   Title  Patient to demonstrate L shoulder and cervical AROM WFL and without pain limiting.     Time  6    Period  Weeks    Status  On-going      PT LONG TERM GOAL #4   Title  Patient to report tolerance of using L hand for sweeping without pain.     Time  6    Period  Weeks    Status  On-going      PT LONG TERM GOAL #5   Title  Patient to report 80% improvement in L shoulder and neck pain.    Time  6    Period  Weeks    Status  On-going            Plan - 10/08/18 0924    Clinical Impression Statement  Patient arrived to session with no new complaints. Progressed cervical retractions in prone positioning with patient requiring cues to tilt chin down. Patient reported mild discomfort in L shoulder with prone on  elbows positioning, but able to perform exercises with good form after cues. Reviewed previously administered HEP handout for improved confidence and compliance. Able to progress horizontal abduction and ER with increased banded resistance with good form today. Patient tolerated all remaining ther-ex well. Did c/o 6/10 L shoulder pain at end of session compared to 4/10 at baseline. Addressed this pain with moist heat to L shoulder. Patient without complaints at end of session.    Comorbidities  thyroid disease, hx of endometrial CA treated with chemo, osteopenia, LBP HTN, HLD, GERD, BPPV, asthma    PT Treatment/Interventions  ADLs/Self Care Home Management;Cryotherapy;Electrical Stimulation;Iontophoresis 75m/ml Dexamethasone;Functional mobility training;Ultrasound;Moist Heat;Therapeutic activities;Therapeutic exercise;Neuromuscular re-education;Patient/family education;Passive range of motion;Manual techniques;Dry needling;Energy conservation;Splinting;Taping;Vasopneumatic Device    PT Next Visit Plan  progress periscapular strengthening     Consulted and Agree with Plan of Care  Patient       Patient will benefit from skilled therapeutic intervention in order to improve the following deficits and impairments:  Decreased activity tolerance, Decreased strength, Impaired UE functional use, Pain, Improper body mechanics, Decreased range of motion, Impaired flexibility, Postural dysfunction  Visit Diagnosis: Chronic left shoulder pain  Stiffness of left shoulder, not elsewhere classified  Cervicalgia  Muscle weakness (generalized)  Abnormal posture     Problem List Patient Active Problem List   Diagnosis Date Noted  . Moderate persistent asthma without complication 009/81/1914 . Current use of beta blocker 06/30/2018  . Allergic rhinitis 06/30/2018  . Acute bronchitis due to other specified organisms 06/30/2018  . History of endometrial cancer 02/03/2018  . Screening Mammogram 11/05/2017   . 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  . Type 2 diabetes mellitus with diabetic neuropathy, without long-term current use of insulin ([facility] 10/25/2016  . Microcytosis 09/28/2016  . Mild intermittent asthma without complication 078/29/5621 . Port catheter in place 11/15/2015  . Chemotherapy induced neutropenia ([facility] 11/29/2014  . Family history of breast cancer   . Family history of ovarian cancer   . Family history of prostate cancer   . Family history of colon cancer   . Morbid obesity with body mass index of 40.0-44.9 in adult ([facility] 11/09/2014  . Chemotherapy-induced peripheral neuropathy ([facility] 09/22/2014  . Endometrial cancer ([facility] 05/20/2014  . 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  . Thyroid disease 06/01/2013  . Low back pain radiating to right leg 08/18/2012  . Anemia 11/07/2011  . Hyperlipidemia 06/15/2011  . Diabetes mellitus type 2 in obese ([facility] 03/12/2011  . Chronic pain of right knee 01/27/2011  . Obstructive sleep apnea 11/15/2010  . Osteoarthritis 11/15/2010  . Gastroesophageal reflux disease without esophagitis 09/14/2010  . DIVERTICULITIS, HX OF 09/14/2010  . POSTMENOPAUSAL STATUS 10/01/2008  . COLONIC POLYPS, HX OF 03/10/2008  . Essential hypertension 08/13/2006     YJanene Harvey PT, DPT 10/08/18 9:33 AM   [facility] [place] [address]Phone: [phone]  Fax:  [phone] Name: RMALA GIBBARDMRN: [id number] of Birth: [birth year] PHYSICAL THERAPY DISCHARGE SUMMARY  Visits from Start of Care: 6  Current functional level related to goals / functional outcomes: Unable to  assess- patient did not return d/t concern about COVID-19   Remaining deficits: Unable to assess   Education / Equipment: HEP  Plan: Patient agrees to discharge.  Patient goals were not met. Patient is being discharged due to not returning since the last  visit.  ?????     Janene Harvey, PT, DPT 11/19/18 12:43 PM

## 2018-10-09 ENCOUNTER — Encounter: Payer: Self-pay | Admitting: Family Medicine

## 2018-10-13 ENCOUNTER — Ambulatory Visit: Payer: Medicare PPO | Admitting: Physical Therapy

## 2018-10-16 ENCOUNTER — Encounter: Payer: Medicare PPO | Admitting: Physical Therapy

## 2018-10-27 ENCOUNTER — Ambulatory Visit: Payer: Medicare PPO | Admitting: Physical Therapy

## 2018-11-06 ENCOUNTER — Ambulatory Visit: Payer: Medicare PPO | Admitting: Family Medicine

## 2018-11-07 ENCOUNTER — Other Ambulatory Visit: Payer: Self-pay | Admitting: Family Medicine

## 2018-11-07 DIAGNOSIS — I10 Essential (primary) hypertension: Secondary | ICD-10-CM

## 2018-11-21 ENCOUNTER — Ambulatory Visit (HOSPITAL_BASED_OUTPATIENT_CLINIC_OR_DEPARTMENT_OTHER): Payer: Medicare PPO

## 2018-11-25 ENCOUNTER — Ambulatory Visit: Payer: Medicare PPO

## 2018-12-03 ENCOUNTER — Ambulatory Visit (HOSPITAL_BASED_OUTPATIENT_CLINIC_OR_DEPARTMENT_OTHER): Payer: Medicare PPO

## 2018-12-04 ENCOUNTER — Other Ambulatory Visit: Payer: Self-pay

## 2018-12-04 ENCOUNTER — Ambulatory Visit (HOSPITAL_BASED_OUTPATIENT_CLINIC_OR_DEPARTMENT_OTHER)
Admission: RE | Admit: 2018-12-04 | Discharge: 2018-12-04 | Disposition: A | Discharge status: Home / Self Care | Payer: Medicare PPO | Source: Ambulatory Visit | Attending: Family Medicine | Admitting: Family Medicine

## 2018-12-04 DIAGNOSIS — Z1231 Encounter for screening mammogram for malignant neoplasm of breast: Secondary | ICD-10-CM | POA: Diagnosis not present

## 2018-12-09 ENCOUNTER — Other Ambulatory Visit: Payer: Self-pay | Admitting: Family Medicine

## 2018-12-09 DIAGNOSIS — E1169 Type 2 diabetes mellitus with other specified complication: Secondary | ICD-10-CM

## 2018-12-12 NOTE — Progress Notes (Signed)
Virtual Visit via Video Note  I connected with patient on 12/16/18 at  8:00 AM EDT by a video enabled telemedicine application and verified that I am speaking with the correct person using two identifiers.   THIS ENCOUNTER IS A VIRTUAL VISIT DUE TO COVID-19 - PATIENT WAS NOT SEEN IN THE OFFICE. PATIENT HAS CONSENTED TO VIRTUAL VISIT / TELEMEDICINE VISIT   Location of patient: home  Location of provider: office  I discussed the limitations of evaluation and management by telemedicine and the availability of in person appointments. The patient expressed understanding and agreed to proceed.   Subjective:   TEMA ALIRE is a 68 y.o. female who presents for Medicare Annual (Subsequent) preventive examination.  Review of Systems: No ROS.  Medicare Wellness Virtual Visit.  Visual/audio telehealth visit, UTA vital signs.   See social history for additional risk factors. Cardiac Risk Factors include: advanced age (>58men, >28 women);diabetes mellitus;hypertension;obesity (BMI >30kg/m2) Sleep patterns:  Takes Tylenol PM as needed. Home Safety/Smoke Alarms: Feels safe in home. Smoke alarms in place.  Lives with husband in 1 story home.  Female:   Pap- 09/08/18. negative      Mammo- 12/04/18      Dexa scan- 10/15/17        CCS- 10/24/16. 5 yr recall.  Eye- UTD per pt with yearly exam    Objective:     Vitals: BP 120/80 Comment: pt reported  There is no height or weight on file to calculate BMI.  Advanced Directives 12/16/2018 09/25/2018 09/22/2018 03/20/2018 12/05/2017 09/09/2017 04/30/2017  Does Patient Have a Medical Advance Directive? Yes Yes Yes Yes Yes Yes Yes  Type of Paramedic of [place];Living will Living will - [place];Living will - [place];Living will Living will  Does patient want to make changes to medical advance directive? No - Patient declined No - Patient declined No - Patient declined - Yes  (MAU/Ambulatory/Procedural Areas - Information given) - Yes (MAU/Ambulatory/Procedural Areas - Information given)  Copy of [place] in Chart? No - copy requested - - No - copy requested - No - copy requested -    Tobacco Social History   Tobacco Use  Smoking Status Never Smoker  Smokeless Tobacco Never Used     Counseling given: Not Answered   Clinical Intake: Pain : No/denies pain    Past Medical History:  Diagnosis Date   Anxiety    Arthritis    back   Asthma    Benign paroxysmal positional vertigo 11/01/2012   Cancer ([place]) 12/22/2014   endometrial   Depression    Endometrial cancer ([place]) 2015   Family history of breast cancer    Family history of colon cancer    Family history of ovarian cancer    Family history of prostate cancer    GERD (gastroesophageal reflux disease)    History of colonic diverticulitis    Hx of colonic polyps    Hyperlipidemia    Hypertension    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   Low back pain radiating to right leg 08/18/2012   Obesity    Osteopenia 09/2017   T score -1.2 FRAX 3.2% / 0.3%   Personal history of chemotherapy 2015   Radiation 12/17/14-01/03/15   vaginal brachytherapy   Thyroid disease 06/01/2013   Past Surgical History:  Procedure Laterality Date   ABDOMINAL HYSTERECTOMY  06/23/14   [facility], TRH/BSO   CHOLECYSTECTOMY  1991   DILATION AND CURETTAGE OF UTERUS  IR REMOVAL TUN ACCESS W/ PORT W/O FL MOD SED  04/30/2017   TUBAL LIGATION     Family History  Problem Relation Age of Onset   Heart disease Maternal Aunt        x 3 -2 brothers   Hypertension Maternal Aunt    Breast cancer Maternal Aunt        maternal half; dx in her 49s   Cancer Maternal Aunt        cervical   Ovarian cancer Maternal Aunt        dx in her 1s   Prostate cancer Father 1   Diabetes Father    Cancer Father        lung cancer, asbestos exposure   Heart disease Brother    Diabetes  Brother    Diabetes Mother    Lupus Mother    Heart disease Brother    Hyperlipidemia Sister    Heart disease Brother    Diabetes Brother    Colon polyps Brother    Heart disease Brother    Diabetes Brother    Colon polyps Brother    Colon cancer Maternal Grandmother        dx in her 26s   Leukemia Maternal Uncle 22   Cancer Paternal Aunt        NOS- breast    Prostate cancer Paternal Uncle        dx in his 35s   Breast cancer Other        dx in her 46s   Social History   Socioeconomic History   Marital status: Married    Spouse name: Not on file   Number of children: 3   Years of education: Not on file   Highest education level: Not on file  Occupational History   Occupation: retired Proofreader strain: Not on file   Food insecurity:    Worry: Not on file    Inability: Not on Lexicographer needs:    Medical: Not on file    Non-medical: Not on file  Tobacco Use   Smoking status: Never Smoker   Smokeless tobacco: Never Used  Substance and Sexual Activity   Alcohol use: No    Alcohol/week: 0.0 standard drinks   Drug use: No   Sexual activity: Yes    Partners: Male    Comment: 1st intercourse- 57, partners- 75, married- 42 yrs   Lifestyle   Physical activity:    Days per week: Not on file    Minutes per session: Not on file   Stress: Not on file  Relationships   Social connections:    Talks on phone: Not on file    Gets together: Not on file    Attends religious service: Not on file    Active member of club or organization: Not on file    Attends meetings of clubs or organizations: Not on file    Relationship status: Not on file  Other Topics Concern   Not on file  Social History Narrative   Married- 32 years   Never Smoked   Alcohol use-no   Drug use-no   Occupation: housewife   Caffeine use/day:  None   Does Patient Exercise:  yes    Outpatient Encounter Medications as of  12/16/2018  Medication Sig   albuterol (PROVENTIL) (2.5 MG/3ML) 0.083% nebulizer solution Take 3 mLs (2.5 mg total) by nebulization every 4 (four) hours as needed for  wheezing or shortness of breath.   amLODipine (NORVASC) 10 MG tablet TAKE 1 TABLET EVERY DAY   aspirin 81 MG tablet Take 81 mg by mouth daily.     atorvastatin (LIPITOR) 40 MG tablet TAKE 1 TABLET EVERY DAY   B Complex CAPS Take by mouth.   benazepril-hydrochlorthiazide (LOTENSIN HCT) 20-25 MG tablet TAKE 1 TABLET EVERY DAY   carvedilol (COREG) 12.5 MG tablet TAKE 1 TABLET TWICE DAILY WITH MEALS   diphenhydramine-acetaminophen (TYLENOL PM) 25-500 MG TABS Take 1 tablet by mouth at bedtime as needed. Reported on 01/30/2016   EPINEPHrine (EPIPEN 2-PAK) 0.3 mg/0.3 mL IJ SOAJ injection Inject 0.3 mg into the muscle once. Use as directed for severe allergic reactions   furosemide (LASIX) 20 MG tablet TAKE 1 TABLET EVERY DAY   levothyroxine (SYNTHROID, LEVOTHROID) 75 MCG tablet Take 1 tablet (75 mcg total) by mouth daily before breakfast.   meloxicam (MOBIC) 15 MG tablet Take by mouth.   Multiple Vitamin (MULTIVITAMIN) tablet Take 1 tablet by mouth daily.   nebivolol (BYSTOLIC) 10 MG tablet Take by mouth.   Probiotic Product (ALIGN) 4 MG CAPS Take 1 capsule by mouth daily.    traMADol (ULTRAM) 50 MG tablet Take 1 tablet (50 mg total) by mouth every 12 (twelve) hours as needed for moderate pain.   albuterol (PROAIR HFA) 108 (90 Base) MCG/ACT inhaler 2 puffs every 4 hours as needed for coughing or wheezing spells (Patient not taking: Reported on 12/16/2018)   budesonide-formoterol (SYMBICORT) 160-4.5 MCG/ACT inhaler TWO PUFFS TWICE A DAY TO PREVENT COUGH OR WHEEZE. RINSE, GARGLE AND SPIT AFTER USE. (Patient not taking: Reported on 12/16/2018)   metFORMIN (GLUCOPHAGE) 500 MG tablet Take 1 tablet (500 mg total) by mouth daily with breakfast. (Patient not taking: Reported on 12/16/2018)   [DISCONTINUED] benzonatate (TESSALON  PERLES) 100 MG capsule Take 1 capsule (100 mg total) by mouth 3 (three) times daily as needed for cough.   [DISCONTINUED] pantoprazole (PROTONIX) 40 MG tablet Take 1 tablet (40 mg total) by mouth daily.   [DISCONTINUED] triamcinolone cream (KENALOG) 0.1 % Apply twice daily as needed to affected areas below face   Facility-Administered Encounter Medications as of 12/16/2018  Medication   0.9 %  sodium chloride infusion   alteplase (CATHFLO ACTIVASE) injection 2 mg   heparin lock flush 100 unit/mL   sodium chloride flush (NS) 0.9 % injection 10 mL    Activities of Daily Living In your present state of health, do you have any difficulty performing the following activities: 12/16/2018  Hearing? N  Vision? N  Difficulty concentrating or making decisions? N  Walking or climbing stairs? N  Dressing or bathing? N  Doing errands, shopping? N  Preparing Food and eating ? N  Using the Toilet? N  In the past six months, have you accidently leaked urine? N  Do you have problems with loss of bowel control? N  Managing your Medications? N  Managing your Finances? N  Housekeeping or managing your Housekeeping? N  Some recent data might be hidden    Patient Care Team: Shelda Pal, DO as PCP - General (Family Medicine) Suella Broad, MD as Consulting Physician (Physical Medicine and Rehabilitation) Gordy Levan, MD as Consulting Physician (Oncology) Janie Morning, MD as Attending Physician (Obstetrics and Gynecology) Eppie Gibson, MD as Attending Physician (Radiation Oncology)    Assessment:   This is a routine wellness examination for Noely. Physical assessment deferred to PCP.  Exercise Activities and Dietary recommendations Current Exercise  Habits: Home exercise routine, Time (Minutes): 15, Frequency (Times/Week): 4, Weekly Exercise (Minutes/Week): 60, Intensity: Mild, Exercise limited by: None identified   Diet (meal preparation, eat out, water intake,  caffeinated beverages, dairy products, fruits and vegetables): in general, a "healthy" diet  , well balanced, on average, 3 meals per day Breakfast: oatmeal and fruit Lunch: salad Dinner:    Baked chicken and salad Drinks 8 glasses of water per day.  Goals     Increase physical activity     Continue chair exercises and maybe implement Walk Away the Pounds [name]      maintain health (pt-stated)     Reduce portion size       Fall Risk Fall Risk  12/16/2018 09/08/2018 09/09/2017 09/26/2016 03/12/2016  Falls in the past year? 0 0 No No No  Number falls in past yr: - 0 - - -  Injury with Fall? - 0 - - -  Risk for fall due to : - - - - -  Risk for fall due to: Comment - - - - -  Follow up - - - - -    Depression Screen PHQ 2/9 Scores 12/16/2018 09/09/2017 09/26/2016 03/12/2016  PHQ - 2 Score 0 0 0 0     Cognitive Function Ad8 score reviewed for issues:  Issues making decisions:no  Less interest in hobbies / activities:no  Repeats questions, stories (family complaining):no  Trouble using ordinary gadgets (microwave, computer, phone):no  Forgets the month or year: no  Mismanaging finances: no  Remembering appts:no  Daily problems with thinking and/or memory:no Ad8 score is=0   MMSE - Mini Mental State Exam 09/09/2017  Orientation to time 5  Orientation to Place 5  Registration 3  Attention/ Calculation 5  Recall 3  Language- name 2 objects 2  Language- repeat 1  Language- follow 3 step command 3  Language- read & follow direction 1  Write a sentence 1  Copy design 1  Total score 30        Immunization History  Administered Date(s) Administered   Influenza Split 06/11/2011, 05/21/2012   Influenza Whole 06/24/2007, 04/16/2008, 05/02/2010   Influenza,inj,Quad PF,6+ Mos 06/01/2013, 05/19/2015, 03/30/2016, 05/16/2018   Influenza-Unspecified 05/23/2014, 05/21/2017   Pneumococcal Conjugate-13 05/16/2018   Pneumococcal Polysaccharide-23 09/24/2016    Td 09/26/2007   Tdap 05/16/2018   Screening Tests Health Maintenance  Topic Date Due   HEMOGLOBIN A1C  01/22/2018   OPHTHALMOLOGY EXAM  07/22/2018   FOOT EXAM  07/25/2018   INFLUENZA VACCINE  02/21/2019   MAMMOGRAM  12/03/2020   PNA vac Low Risk Adult (2 of 2 - PPSV23) 09/24/2021   COLONOSCOPY  10/24/2021   TETANUS/TDAP  05/16/2028   DEXA SCAN  Completed   Hepatitis C Screening  Completed        Plan:   See you next year!  Continue to eat heart healthy diet (full of fruits, vegetables, whole grains, lean protein, water--limit salt, fat, and sugar intake) and increase physical activity as tolerated.  Bring a copy of your living will and/or healthcare power of attorney to your next office visit.  Continue doing brain stimulating activities (puzzles, reading, adult coloring books, staying active) to keep memory sharp.    I have personally reviewed and noted the following in the patients chart:    Medical and social history  Use of alcohol, tobacco or illicit drugs   Current medications and supplements  Functional ability and status  Nutritional status  Physical activity  Advanced directives  List of other physicians  Hospitalizations, surgeries, and ER visits in previous 12 months  Vitals  Screenings to include cognitive, depression, and falls  Referrals and appointments  In addition, I have reviewed and discussed with patient certain preventive protocols, quality metrics, and best practice recommendations. A written personalized care plan for preventive services as well as general preventive health recommendations were provided to patient.     Naaman Plummer [place], [place]  12/16/2018

## 2018-12-16 ENCOUNTER — Ambulatory Visit (INDEPENDENT_AMBULATORY_CARE_PROVIDER_SITE_OTHER): Payer: Medicare PPO | Admitting: *Deleted

## 2018-12-16 ENCOUNTER — Other Ambulatory Visit: Payer: Self-pay

## 2018-12-16 ENCOUNTER — Encounter: Payer: Self-pay | Admitting: *Deleted

## 2018-12-16 VITALS — BP 120/80

## 2018-12-16 DIAGNOSIS — Z0000 Encounter for general adult medical examination without abnormal findings: Secondary | ICD-10-CM

## 2018-12-16 NOTE — Progress Notes (Signed)
Noted. Agree with above.  [place], DO 12/16/18 8:30 AM

## 2018-12-16 NOTE — Patient Instructions (Signed)
See you next year!  Continue to eat heart healthy diet (full of fruits, vegetables, whole grains, lean protein, water--limit salt, fat, and sugar intake) and increase physical activity as tolerated.  Bring a copy of your living will and/or healthcare power of attorney to your next office visit.  Continue doing brain stimulating activities (puzzles, reading, adult coloring books, staying active) to keep memory sharp.    Katelyn Fisher , Thank you for taking time to come for your Medicare Wellness Visit. I appreciate your ongoing commitment to your health goals. Please review the following plan we discussed and let me know if I can assist you in the future.   These are the goals we discussed: Goals    . Increase physical activity     Continue chair exercises and maybe implement Walk Away the Pounds [name]     . maintain health (pt-stated)    . Reduce portion size       This is a list of the screening recommended for you and due dates:  Health Maintenance  Topic Date Due  . Hemoglobin A1C  01/22/2018  . Eye exam for diabetics  07/22/2018  . Complete foot exam   07/25/2018  . Flu Shot  02/21/2019  . Mammogram  12/03/2020  . Pneumonia vaccines (2 of 2 - PPSV23) 09/24/2021  . Colon Cancer Screening  10/24/2021  . Tetanus Vaccine  05/16/2028  . DEXA scan (bone density measurement)  Completed  .  Hepatitis C: One time screening is recommended by Center for Disease Control  (CDC) for  adults born from [birth year] through 1964.   Completed    Health Maintenance After Age 49 After age 67, you are at a higher risk for certain long-term diseases and infections as well as injuries from falls. Falls are a major cause of broken bones and head injuries in people who are older than age 44. Getting regular preventive care can help to keep you healthy and well. Preventive care includes getting regular testing and making lifestyle changes as recommended by your health care provider. Talk with your  health care provider about:  Which screenings and tests you should have. A screening is a test that checks for a disease when you have no symptoms.  A diet and exercise plan that is right for you. What should I know about screenings and tests to prevent falls? Screening and testing are the best ways to find a health problem early. Early diagnosis and treatment give you the best chance of managing medical conditions that are common after age 22. Certain conditions and lifestyle choices may make you more likely to have a fall. Your health care provider may recommend:  Regular vision checks. Poor vision and conditions such as cataracts can make you more likely to have a fall. If you wear glasses, make sure to get your prescription updated if your vision changes.  Medicine review. Work with your health care provider to regularly review all of the medicines you are taking, including over-the-counter medicines. Ask your health care provider about any side effects that may make you more likely to have a fall. Tell your health care provider if any medicines that you take make you feel dizzy or sleepy.  Osteoporosis screening. Osteoporosis is a condition that causes the bones to get weaker. This can make the bones weak and cause them to break more easily.  Blood pressure screening. Blood pressure changes and medicines to control blood pressure can make you feel dizzy.  Strength  and balance checks. Your health care provider may recommend certain tests to check your strength and balance while standing, walking, or changing positions.  Foot health exam. Foot pain and numbness, as well as not wearing proper footwear, can make you more likely to have a fall.  Depression screening. You may be more likely to have a fall if you have a fear of falling, feel emotionally low, or feel unable to do activities that you used to do.  Alcohol use screening. Using too much alcohol can affect your balance and may make you  more likely to have a fall. What actions can I take to lower my risk of falls? General instructions  Talk with your health care provider about your risks for falling. Tell your health care provider if: ? You fall. Be sure to tell your health care provider about all falls, even ones that seem minor. ? You feel dizzy, sleepy, or off-balance.  Take over-the-counter and prescription medicines only as told by your health care provider. These include any supplements.  Eat a healthy diet and maintain a healthy weight. A healthy diet includes low-fat dairy products, low-fat (lean) meats, and fiber from whole grains, beans, and lots of fruits and vegetables. Home safety  Remove any tripping hazards, such as rugs, cords, and clutter.  Install safety equipment such as grab bars in bathrooms and safety rails on stairs.  Keep rooms and walkways well-lit. Activity   Follow a regular exercise program to stay fit. This will help you maintain your balance. Ask your health care provider what types of exercise are appropriate for you.  If you need a cane or walker, use it as recommended by your health care provider.  Wear supportive shoes that have nonskid soles. Lifestyle  Do not drink alcohol if your health care provider tells you not to drink.  If you drink alcohol, limit how much you have: ? 0-1 drink a day for women. ? 0-2 drinks a day for men.  Be aware of how much alcohol is in your drink. In the [place] one drink equals one typical bottle of beer (12 oz), one-half glass of wine (5 oz), or one shot of hard liquor (1 oz).  Do not use any products that contain nicotine or tobacco, such as cigarettes and e-cigarettes. If you need help quitting, ask your health care provider. Summary  Having a healthy lifestyle and getting preventive care can help to protect your health and wellness after age 53.  Screening and testing are the best way to find a health problem early and help you avoid having  a fall. Early diagnosis and treatment give you the best chance for managing medical conditions that are more common for people who are older than age 68.  Falls are a major cause of broken bones and head injuries in people who are older than age 71. Take precautions to prevent a fall at home.  Work with your health care provider to learn what changes you can make to improve your health and wellness and to prevent falls. This information is not intended to replace advice given to you by your health care provider. Make sure you discuss any questions you have with your health care provider. Document Released: 05/22/2017 Document Revised: 05/22/2017 Document Reviewed: 05/22/2017 Elsevier Interactive Patient Education  2019 [employer].

## 2018-12-23 ENCOUNTER — Telehealth: Payer: Self-pay | Admitting: *Deleted

## 2018-12-23 NOTE — Telephone Encounter (Signed)
Patient called and scheduled her appt for September

## 2018-12-25 ENCOUNTER — Encounter: Payer: Self-pay | Admitting: Family Medicine

## 2018-12-26 ENCOUNTER — Other Ambulatory Visit: Payer: Self-pay | Admitting: Family Medicine

## 2018-12-26 DIAGNOSIS — M25512 Pain in left shoulder: Secondary | ICD-10-CM

## 2018-12-26 NOTE — Progress Notes (Signed)
am

## 2018-12-30 ENCOUNTER — Encounter: Payer: Self-pay | Admitting: Family Medicine

## 2018-12-30 ENCOUNTER — Other Ambulatory Visit: Payer: Self-pay

## 2018-12-30 ENCOUNTER — Ambulatory Visit (INDEPENDENT_AMBULATORY_CARE_PROVIDER_SITE_OTHER): Payer: Medicare PPO | Admitting: Family Medicine

## 2018-12-30 ENCOUNTER — Ambulatory Visit: Payer: Self-pay

## 2018-12-30 VITALS — BP 126/93 | Ht 63.0 in | Wt 209.0 lb

## 2018-12-30 DIAGNOSIS — M25512 Pain in left shoulder: Secondary | ICD-10-CM

## 2018-12-30 DIAGNOSIS — M7542 Impingement syndrome of left shoulder: Secondary | ICD-10-CM

## 2018-12-30 HISTORY — DX: Impingement syndrome of left shoulder: M75.42

## 2018-12-30 MED ORDER — METHYLPREDNISOLONE ACETATE 40 MG/ML IJ SUSP
40.0000 mg | Freq: Once | INTRAMUSCULAR | Status: AC
  Administered 2018-12-30: 40 mg via INTRA_ARTICULAR

## 2018-12-30 NOTE — Patient Instructions (Signed)
Nice to meet you Please try volarten rub on cream. This is over the counter  Please try ice on the shoulder  Please send me a message in [place] with any questions or updates.  Please see me back in 4 weeks.   --Dr. Raeford Razor

## 2018-12-30 NOTE — Progress Notes (Addendum)
Katelyn Fisher - 68 y.o. female MRN [id number]  Date of birth: 14-Aug-1950  SUBJECTIVE:  Including CC & ROS.  Chief Complaint  Patient presents with  . Shoulder Pain    left shoulder    Katelyn Fisher is a 68 y.o. female that is presenting with acute on chronic worsening left shoulder pain.  The pain is occurring proximally as well as laterally over the left shoulder.  She has been undergoing physical therapy with little bit improvement.  She reports receiving an injection sometime last year.  She is unsure if this helped.  The pain seems to be more constant in nature.  The pain can be moderate to severe.  She is right-handed.  The pain is localized to the shoulder.  She denies any specific inciting event.  Has tried over-the-counter medications with limited improvement.  Denies any history of surgery.  No radicular symptoms.  Independent review of the left shoulder x-ray from 2017 shows no significant glenohumeral disease.   Review of Systems  Constitutional: Negative for fever.  HENT: Negative for congestion.   Respiratory: Negative for cough.   Cardiovascular: Negative for chest pain.  Gastrointestinal: Negative for abdominal pain.  Musculoskeletal: Positive for arthralgias.  Skin: Negative for color change.  Neurological: Negative for weakness.  Hematological: Negative for adenopathy.    HISTORY: Past Medical, Surgical, Social, and Family History Reviewed & Updated per EMR.   Pertinent Historical Findings include:  Past Medical History:  Diagnosis Date  . Anxiety   . Arthritis    back  . Asthma   . Benign paroxysmal positional vertigo 11/01/2012  . Cancer ([place]) 12/22/2014   endometrial  . Depression   . Endometrial cancer ([place]) 2015  . Family history of breast cancer   . Family history of colon cancer   . Family history of ovarian cancer   . Family history of prostate cancer   . GERD (gastroesophageal reflux disease)   . History of colonic diverticulitis   . Hx of  colonic polyps   . Hyperlipidemia   . Hypertension   . 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  . Low back pain radiating to right leg 08/18/2012  . Obesity   . Osteopenia 09/2017   T score -1.2 FRAX 3.2% / 0.3%  . Personal history of chemotherapy 2015  . Radiation 12/17/14-01/03/15   vaginal brachytherapy  . Thyroid disease 06/01/2013    Past Surgical History:  Procedure Laterality Date  . ABDOMINAL HYSTERECTOMY  06/23/14   [facility], TRH/BSO  . CHOLECYSTECTOMY  1991  . DILATION AND CURETTAGE OF UTERUS    . IR REMOVAL TUN ACCESS W/ PORT W/O FL MOD SED  04/30/2017  . TUBAL LIGATION      Allergies  Allergen Reactions  . Shellfish Allergy Hives, Shortness Of Breath and Swelling  . Oxycodone Swelling    Facial swelling and tightness  . Penicillins Swelling    Rash with welps  . Iodinated Diagnostic Agents Rash  . Sulfa Antibiotics Rash    Family History  Problem Relation Age of Onset  . Heart disease Maternal Aunt        x 3 -2 brothers  . Hypertension Maternal Aunt   . Breast cancer Maternal Aunt        maternal half; dx in her 89s  . Cancer Maternal Aunt        cervical  . Ovarian cancer Maternal Aunt        dx in her 39s  . Prostate cancer Father 62  .  Diabetes Father   . Cancer Father        lung cancer, asbestos exposure  . Heart disease Brother   . Diabetes Brother   . Diabetes Mother   . Lupus Mother   . Heart disease Brother   . Hyperlipidemia Sister   . Heart disease Brother   . Diabetes Brother   . Colon polyps Brother   . Heart disease Brother   . Diabetes Brother   . Colon polyps Brother   . Colon cancer Maternal Grandmother        dx in her 51s  . Leukemia Maternal Uncle 21  . Cancer Paternal Aunt        NOS- breast   . Prostate cancer Paternal Uncle        dx in his 29s  . Breast cancer Other        dx in her 45s     Social History   Socioeconomic History  . Marital status: Married    Spouse name: Not on file  . Number of children: 3  . Years  of education: Not on file  . Highest education level: Not on file  Occupational History  . Occupation: retired Transport planner  . Financial resource strain: Not on file  . Food insecurity:    Worry: Not on file    Inability: Not on file  . Transportation needs:    Medical: Not on file    Non-medical: Not on file  Tobacco Use  . Smoking status: Never Smoker  . Smokeless tobacco: Never Used  Substance and Sexual Activity  . Alcohol use: No    Alcohol/week: 0.0 standard drinks  . Drug use: No  . Sexual activity: Yes    Partners: Male    Comment: 1st intercourse- 32, partners- 52, married- 9 yrs   Lifestyle  . Physical activity:    Days per week: Not on file    Minutes per session: Not on file  . Stress: Not on file  Relationships  . Social connections:    Talks on phone: Not on file    Gets together: Not on file    Attends religious service: Not on file    Active member of club or organization: Not on file    Attends meetings of clubs or organizations: Not on file    Relationship status: Not on file  . Intimate partner violence:    Fear of current or ex partner: Not on file    Emotionally abused: Not on file    Physically abused: Not on file    Forced sexual activity: Not on file  Other Topics Concern  . Not on file  Social History Narrative   Married- 71 years   Never Smoked   Alcohol use-no   Drug use-no   Occupation: housewife   Caffeine use/day:  None   Does Patient Exercise:  yes     PHYSICAL EXAM:  VS: BP (!) 126/93   Ht 5\' 3"  (1.6 m)   Wt 209 lb (94.8 kg)   BMI 37.02 kg/m  Physical Exam Gen: NAD, alert, cooperative with exam, well-appearing ENT: normal lips, normal nasal mucosa,  Eye: normal EOM, normal conjunctiva and lids CV:  no edema, +2 pedal pulses   Resp: no accessory muscle use, non-labored,  Skin: no rashes, no areas of induration  Neuro: normal tone, normal sensation to touch Psych:  normal insight, alert and oriented MSK:   Left shoulder:  Shoulder: Inspection reveals  no abnormalities, atrophy or asymmetry. Some pain to palpation over the [facility] joint ROM is full in all planes. Rotator cuff strength normal throughout. Mild pain with Hawkin's tests, empty can sign. Speeds  tests normal. negative Obrien's Normal scapular function observed. Neurovascularly intact    Limited ultrasound: Left shoulder:  Normal-appearing biceps tendon. There appears to be a defect of the subscapularis on the articular side.  Could represent a small tear.. Supraspinatus with no significant tendinitis type changes. AC joint with degenerative changes and a geyser sign.  Summary: Findings suggestive degenerative changes and effusion of the [facility] joint and a possible subscapularis tear.  Ultrasound and interpretation by [name], MD   Aspiration/Injection Procedure Note Katelyn Fisher 10-18-1950  Procedure: Injection Indications: Left shoulder pain  Procedure Details Consent: Risks of procedure as well as the alternatives and risks of each were explained to the (patient/caregiver).  Consent for procedure obtained. Time Out: Verified patient identification, verified procedure, site/side was marked, verified correct patient position, special equipment/implants available, medications/allergies/relevent history reviewed, required imaging and test results available.  Performed.  The area was cleaned with iodine and alcohol swabs.    The left AC joint was injected using 1 cc's of 40 mg Depo-Medrol and 1 cc's of 0.5 % bupivacaine with a 2 5 1  1/2" needle.  Ultrasound was used. Images were obtained in trans-views showing the injection.     A sterile dressing was applied.  Patient did tolerate procedure well.    ASSESSMENT & PLAN:   Pain in shoulder region, left Pain seems related to the [facility] joint.  Possible to have a component of glenohumeral changes.  No changes previously seen on x-ray from 2017.  There is a possible  subscapularis tear observed on ultrasound. -AC joint injection today. -Counseled on home exercise therapy and supportive care. -Can consider updated imaging or subacromial injection or nitroglycerin patches.

## 2018-12-30 NOTE — Assessment & Plan Note (Addendum)
Pain seems related to the [facility] joint.  Possible to have a component of glenohumeral changes.  No changes previously seen on x-ray from 2017.  There is a possible subscapularis tear observed on ultrasound. -AC joint injection today. -Counseled on home exercise therapy and supportive care. -Can consider updated imaging or subacromial injection or nitroglycerin patches.

## 2019-01-05 ENCOUNTER — Ambulatory Visit (INDEPENDENT_AMBULATORY_CARE_PROVIDER_SITE_OTHER): Payer: Medicare PPO | Admitting: Family Medicine

## 2019-01-05 ENCOUNTER — Encounter: Payer: Self-pay | Admitting: Family Medicine

## 2019-01-05 ENCOUNTER — Other Ambulatory Visit: Payer: Self-pay

## 2019-01-05 VITALS — BP 126/80 | HR 77 | Temp 98.5°F | Resp 17 | Ht 62.0 in | Wt 215.6 lb

## 2019-01-05 DIAGNOSIS — K219 Gastro-esophageal reflux disease without esophagitis: Secondary | ICD-10-CM

## 2019-01-05 DIAGNOSIS — E1140 Type 2 diabetes mellitus with diabetic neuropathy, unspecified: Secondary | ICD-10-CM

## 2019-01-05 DIAGNOSIS — J4540 Moderate persistent asthma, uncomplicated: Secondary | ICD-10-CM

## 2019-01-05 DIAGNOSIS — Z8542 Personal history of malignant neoplasm of other parts of uterus: Secondary | ICD-10-CM

## 2019-01-05 DIAGNOSIS — Z79899 Other long term (current) drug therapy: Secondary | ICD-10-CM | POA: Diagnosis not present

## 2019-01-05 DIAGNOSIS — T7800XD Anaphylactic reaction due to unspecified food, subsequent encounter: Secondary | ICD-10-CM

## 2019-01-05 DIAGNOSIS — J309 Allergic rhinitis, unspecified: Secondary | ICD-10-CM

## 2019-01-05 MED ORDER — BUDESONIDE-FORMOTEROL FUMARATE 160-4.5 MCG/ACT IN AERO: 2.0000 | INHALATION_SPRAY | Freq: Two times a day (BID) | RESPIRATORY_TRACT | 5 refills | Status: DC

## 2019-01-05 NOTE — Patient Instructions (Addendum)
Asthma Begin Symbicort 160-2 puffs twice a day with a spacer to prevent cough and wheeze  ProAir 2 puffs every 4 hours if needed for wheezing or coughing spells AZ&ME paperwork started for Symbicort assistance  Allergic rhinitis Continue Zyrtec 10 mg-take 1 tablet once a day if needed for runny nose or itchy eyes Flonase 1-2 sprays in each nostril once a day as needed for a stuffy nose Consider nasal rinses once a day as needed to reduce nasal symptoms  Food allergy Continue avoiding shellfish.  If you have an allergic reaction take Benadryl 50 mg every 4 hours and if you have life-threatening symptoms inject with EpiPen 0.3 mg Continue on your other medications as listed in the chart  Reflux Continue lifestyle modifications   Call us if you are not doing well on this treatment plan  Follow up in 4 months or sooner if needed   Lifestyle Changes for Controlling GERD When you have GERD, stomach acid feels as if it's backing up toward your mouth. Whether or not you take medication to control your GERD, your symptoms can often be improved with lifestyle changes.   Raise Your Head  Reflux is more likely to strike when you're lying down flat, because stomach fluid can  flow backward more easily. Raising the head of your bed 4-6 inches can help. To do this:  Slide blocks or books under the legs at the head of your bed. Or, place a wedge under  the mattress. Many foam stores can make a suitable wedge for you. The wedge  should run from your waist to the top of your head.  Don't just prop your head on several pillows. This increases pressure on your  stomach. It can make GERD worse.  Watch Your Eating Habits Certain foods may increase the acid in your stomach or relax the lower esophageal sphincter, making GERD more likely. It's best to avoid the following:  Coffee, tea, and carbonated drinks (with and without caffeine)  Fatty, fried, or spicy food  Mint, chocolate, onions,  and tomatoes  Any other foods that seem to irritate your stomach or cause you pain  Relieve the Pressure  Eat smaller meals, even if you have to eat more often.  Don't lie down right after you eat. Wait a few hours for your stomach to empty.  Avoid tight belts and tight-fitting clothes.  Lose excess weight.  Tobacco and Alcohol  Avoid smoking tobacco and drinking alcohol. They can make GERD symptoms worse.

## 2019-01-05 NOTE — Progress Notes (Addendum)
[address] [place] [address] Dept: [phone]  FOLLOW UP NOTE  Patient ID: Katelyn Fisher, female    DOB: 10/10/1950  Age: 68 y.o. MRN: [id number] Date of Office Visit: 01/05/2019  Assessment  Chief Complaint: Asthma  HPI Katelyn Fisher is a 68 year old female who presents to the clinic for a follow up visit. At today's visit, she reports her asthma has been moderately well controlled with occasional shortness of breath with activity and intermittent dry cough 1-2 times a week. She denies wheeze and cough at night. She reports she is currently using Symbicort 1 puff in the morning and 1 puff at night and albuterol about 1 time a week. Allergic rhinitis is reported as well controlled with occasional nasal congestion. She continues to use Flonase and cetirizine as needed with relief of symptoms. She denies reflux and is not taking any medications at this time. She continues to avoid shellfish and has not had any accidental ingestion of this food nor has she needed to use her EpiPen since her last visit to this clinic.    Drug Allergies:  Allergies  Allergen Reactions  . Shellfish Allergy Hives, Shortness Of Breath and Swelling  . Oxycodone Swelling    Facial swelling and tightness  . Penicillins Swelling    Rash with welps  . Iodinated Diagnostic Agents Rash  . Sulfa Antibiotics Rash    Physical Exam: BP 126/80 (BP Location: Right Arm, Patient Position: Sitting, Cuff Size: Normal)   Pulse 77   Temp 98.5 F (36.9 C) (Oral)   Resp 17   Ht 5\' 2"  (1.575 m)   Wt 215 lb 9.6 oz (97.8 kg)   SpO2 98%   BMI 39.43 kg/m    Physical Exam Vitals signs reviewed.  Constitutional:      Appearance: Normal appearance.  HENT:     Head: Normocephalic and atraumatic.     Right Ear: Tympanic membrane normal.     Left Ear: Tympanic membrane normal.     Nose:     Comments: Bilateral nares slightly erythematous with clear nasal drainage noted. Pharynx normal. Ears normal. Eyes  normal.    Mouth/Throat:     Pharynx: Oropharynx is clear.  Eyes:     Conjunctiva/sclera: Conjunctivae normal.  Neck:     Musculoskeletal: Normal range of motion and neck supple.  Cardiovascular:     Rate and Rhythm: Normal rate and regular rhythm.     Heart sounds: Normal heart sounds. No murmur.  Pulmonary:     Effort: Pulmonary effort is normal.     Breath sounds: Normal breath sounds.     Comments: Lungs clear to auscultation Musculoskeletal:     Comments: Recent tear to left rotator cuff with limited left shoulder range of motion.   Skin:    General: Skin is warm and dry.  Neurological:     Mental Status: She is alert and oriented to person, place, and time.  Psychiatric:        Mood and Affect: Mood normal.        Behavior: Behavior normal.        Thought Content: Thought content normal.        Judgment: Judgment normal.     Diagnostics: FVC 1.77, FEV1 1.22. Predicted FVE 2.25, predicted FEV1 1.75. Spirometry indicates  Assessment and Plan: 1. Moderate persistent asthma without complication   2. Allergic rhinitis, unspecified seasonality, unspecified trigger   3. Anaphylactic shock due to food, subsequent encounter  4. Gastroesophageal reflux disease without esophagitis   5. Type 2 diabetes mellitus with diabetic neuropathy, without long-term current use of insulin ([place])   6. Current use of beta blocker   7. History of endometrial cancer     Meds ordered this encounter  Medications  . budesonide-formoterol (SYMBICORT) 160-4.5 MCG/ACT inhaler    Sig: Inhale 2 puffs into the lungs 2 (two) times daily.    Dispense:  1 Inhaler    Refill:  5    Patient Instructions  Asthma Begin Symbicort 160-2 puffs twice a day with a spacer to prevent cough and wheeze  ProAir 2 puffs every 4 hours if needed for wheezing or coughing spells AZ&ME paperwork started for Symbicort assistance  Allergic rhinitis Continue Zyrtec 10 mg-take 1 tablet once a day if needed for runny  nose or itchy eyes Flonase 1-2 sprays in each nostril once a day as needed for a stuffy nose Consider nasal rinses once a day as needed to reduce nasal symptoms  Food allergy Continue avoiding shellfish.  If you have an allergic reaction take Benadryl 50 mg every 4 hours and if you have life-threatening symptoms inject with EpiPen 0.3 mg Continue on your other medications as listed in the chart  Reflux Continue lifestyle modifications   Call us if you are not doing well on this treatment plan  Follow up in 4 months or sooner if needed  Return in about 4 months (around 05/07/2019), or if symptoms worsen or fail to improve.   Thank you for the opportunity to care for this patient.  Please do not hesitate to contact me with questions.  Gareth Morgan, FNP [facility]  I have provided oversight concerning Gareth Morgan' evaluation and treatment of this patient's health issues addressed during today's encounter. I agree with the assessment and therapeutic plan as outlined in the note.   Thank you for the opportunity to care for this patient.  Please do not hesitate to contact me with questions.  Penne Lash, M.D.  [facility] [address] [place], [place] [address] [phone]

## 2019-01-27 ENCOUNTER — Ambulatory Visit (INDEPENDENT_AMBULATORY_CARE_PROVIDER_SITE_OTHER): Payer: Medicare PPO | Admitting: Family Medicine

## 2019-01-27 ENCOUNTER — Ambulatory Visit: Payer: Self-pay

## 2019-01-27 ENCOUNTER — Encounter: Payer: Self-pay | Admitting: Family Medicine

## 2019-01-27 ENCOUNTER — Other Ambulatory Visit: Payer: Self-pay

## 2019-01-27 VITALS — BP 174/93 | HR 93 | Ht 63.0 in | Wt 209.0 lb

## 2019-01-27 DIAGNOSIS — M7542 Impingement syndrome of left shoulder: Secondary | ICD-10-CM

## 2019-01-27 MED ORDER — METHYLPREDNISOLONE ACETATE 40 MG/ML IJ SUSP
40.0000 mg | Freq: Once | INTRAMUSCULAR | Status: AC
  Administered 2019-01-27: 40 mg via INTRA_ARTICULAR

## 2019-01-27 NOTE — Progress Notes (Signed)
Medication Samples have been provided to the patient.  Drug name: Vimovo       Strength: 500mg /20mg        Qty: 2 Boxes  LOT: [id number]  Exp.Date: 01/2020  Dosing instructions: Take 1 tablet by mouth twice a day.  The patient has been instructed regarding the correct time, dose, and frequency of taking this medication, including desired effects and most common side effects.   Sherrie George, MA 10:43 AM 01/27/2019

## 2019-01-27 NOTE — Progress Notes (Signed)
Katelyn Fisher - 68 y.o. female MRN [id number]  Date of birth: 02/10/1951  SUBJECTIVE:  Including CC & ROS.  Chief Complaint  Patient presents with  . Follow-up    follow up for left shoulder    Katelyn Fisher is a 68 y.o. female that is  Presenting with ongoing left shoulder pain. She received an [facility] joint injections and didn't have any relief. Pain is still severe and occurring over the lateral shoulder.  She denies any inciting event.  She denies any radicular symptoms.  The pain is sharp and throbbing in nature.  She was retired.  The pain is worse when she does certain activities around the house.  She cannot put a specific movement that causes the pain.  No history of surgery.    Review of Systems  Constitutional: Negative for fever.  HENT: Negative for congestion.   Respiratory: Negative for cough.   Cardiovascular: Negative for chest pain.  Gastrointestinal: Negative for abdominal pain.  Musculoskeletal: Positive for arthralgias.  Skin: Negative for color change.  Neurological: Negative for weakness.  Hematological: Negative for adenopathy.    HISTORY: Past Medical, Surgical, Social, and Family History Reviewed & Updated per EMR.   Pertinent Historical Findings include:  Past Medical History:  Diagnosis Date  . Anxiety   . Arthritis    back  . Asthma   . Benign paroxysmal positional vertigo 11/01/2012  . Cancer ([place]) 12/22/2014   endometrial  . Depression   . Endometrial cancer ([place]) 2015  . Family history of breast cancer   . Family history of colon cancer   . Family history of ovarian cancer   . Family history of prostate cancer   . GERD (gastroesophageal reflux disease)   . History of colonic diverticulitis   . Hx of colonic polyps   . Hyperlipidemia   . Hypertension   . 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  . Low back pain radiating to right leg 08/18/2012  . Obesity   . Osteopenia 09/2017   T score -1.2 FRAX 3.2% / 0.3%  . Personal history of chemotherapy  2015  . Radiation 12/17/14-01/03/15   vaginal brachytherapy  . Thyroid disease 06/01/2013    Past Surgical History:  Procedure Laterality Date  . ABDOMINAL HYSTERECTOMY  06/23/14   [facility], TRH/BSO  . CHOLECYSTECTOMY  1991  . DILATION AND CURETTAGE OF UTERUS    . IR REMOVAL TUN ACCESS W/ PORT W/O FL MOD SED  04/30/2017  . TUBAL LIGATION      Allergies  Allergen Reactions  . Shellfish Allergy Hives, Shortness Of Breath and Swelling  . Oxycodone Swelling    Facial swelling and tightness  . Penicillins Swelling    Rash with welps  . Iodinated Diagnostic Agents Rash  . Sulfa Antibiotics Rash    Family History  Problem Relation Age of Onset  . Heart disease Maternal Aunt        x 3 -2 brothers  . Hypertension Maternal Aunt   . Breast cancer Maternal Aunt        maternal half; dx in her 52s  . Cancer Maternal Aunt        cervical  . Ovarian cancer Maternal Aunt        dx in her 25s  . Prostate cancer Father 25  . Diabetes Father   . Cancer Father        lung cancer, asbestos exposure  . Heart disease Brother   . Diabetes Brother   . Diabetes Mother   .  Lupus Mother   . Heart disease Brother   . Hyperlipidemia Sister   . Heart disease Brother   . Diabetes Brother   . Colon polyps Brother   . Heart disease Brother   . Diabetes Brother   . Colon polyps Brother   . Colon cancer Maternal Grandmother        dx in her 107s  . Leukemia Maternal Uncle 21  . Cancer Paternal Aunt        NOS- breast   . Prostate cancer Paternal Uncle        dx in his 75s  . Breast cancer Other        dx in her 69s     Social History   Socioeconomic History  . Marital status: Married    Spouse name: Not on file  . Number of children: 3  . Years of education: Not on file  . Highest education level: Not on file  Occupational History  . Occupation: retired Transport planner  . Financial resource strain: Not on file  . Food insecurity    Worry: Not on file    Inability: Not on  file  . Transportation needs    Medical: Not on file    Non-medical: Not on file  Tobacco Use  . Smoking status: Never Smoker  . Smokeless tobacco: Never Used  Substance and Sexual Activity  . Alcohol use: No    Alcohol/week: 0.0 standard drinks  . Drug use: No  . Sexual activity: Yes    Partners: Male    Comment: 1st intercourse- 34, partners- 32, married- 48 yrs   Lifestyle  . Physical activity    Days per week: Not on file    Minutes per session: Not on file  . Stress: Not on file  Relationships  . Social Herbalist on phone: Not on file    Gets together: Not on file    Attends religious service: Not on file    Active member of club or organization: Not on file    Attends meetings of clubs or organizations: Not on file    Relationship status: Not on file  . Intimate partner violence    Fear of current or ex partner: Not on file    Emotionally abused: Not on file    Physically abused: Not on file    Forced sexual activity: Not on file  Other Topics Concern  . Not on file  Social History Narrative   Married- 36 years   Never Smoked   Alcohol use-no   Drug use-no   Occupation: housewife   Caffeine use/day:  None   Does Patient Exercise:  yes     PHYSICAL EXAM:  VS: BP (!) 174/93   Pulse 93   Ht 5\' 3"  (1.6 m)   Wt 209 lb (94.8 kg)   BMI 37.02 kg/m  Physical Exam Gen: NAD, alert, cooperative with exam, well-appearing ENT: normal lips, normal nasal mucosa,  Eye: normal EOM, normal conjunctiva and lids CV:  no edema, +2 pedal pulses   Resp: no accessory muscle use, non-labored,  Skin: no rashes, no areas of induration  Neuro: normal tone, normal sensation to touch Psych:  normal insight, alert and oriented MSK:  Left shoulder:  Normal active flexion and abduction  Normal IR and ER  Normal strength to resistance  Normal Speed's test  Pain with empty can testing. Positive Hawkins test. Neurovascular intact   Aspiration/Injection Procedure  Note  Brittainy A Colston 1951-04-09  Procedure: Injection Indications: Left shoulder pain  Procedure Details Consent: Risks of procedure as well as the alternatives and risks of each were explained to the (patient/caregiver).  Consent for procedure obtained. Time Out: Verified patient identification, verified procedure, site/side was marked, verified correct patient position, special equipment/implants available, medications/allergies/relevent history reviewed, required imaging and test results available.  Performed.  The area was cleaned with iodine and alcohol swabs.    The left subacromial test was injected using 1 cc's of 40 mg Depo-medrol and 4 cc's of 0.25% bupivacaine with a 25 1 1/2" needle.  Ultrasound was used. Images were obtained in long views showing the injection.     A sterile dressing was applied.  Patient did tolerate procedure well.       ASSESSMENT & PLAN:   Subacromial impingement of left shoulder She did not receive improvement with AC joint injection.  More likely to be impingement at this time. -Subacromial injection. -Counseled on home exercise therapy and supportive care. -Provided samples of Vimovo. -If no improvement consider physical therapy versus nitro.

## 2019-01-27 NOTE — Patient Instructions (Signed)
Good to see you Please try the exercises  Please try the medicine. 2 pills daily, one pill in the morning and one at night.   Please send me a message in MyChart with any questions or updates.  Please see me back in 4 weeks.   --Dr. Raeford Razor

## 2019-01-28 NOTE — Assessment & Plan Note (Signed)
She did not receive improvement with AC joint injection.  More likely to be impingement at this time. -Subacromial injection. -Counseled on home exercise therapy and supportive care. -Provided samples of Vimovo. -If no improvement consider physical therapy versus nitro.

## 2019-02-06 ENCOUNTER — Telehealth: Payer: Self-pay | Admitting: Family Medicine

## 2019-02-06 NOTE — Telephone Encounter (Signed)
Pt informed that med is available to her pharmacy.

## 2019-02-06 NOTE — Telephone Encounter (Signed)
PT called to leave message for [facility]. Last OV she filled out forms to get free Symbicort [address]. PT reports that they denied her. Looking for anything else to help get this med.

## 2019-02-06 NOTE — Telephone Encounter (Signed)
Anne please advise. We do not have any samples available in the [facility] office.

## 2019-02-06 NOTE — Telephone Encounter (Signed)
Thank you. Let's have her use the free Symbicort for this month while we search around for an inhaler that's more affordable. Can you please order Dulera 200-2 puffs twice a day for her and just see if it's covered.

## 2019-02-06 NOTE — Telephone Encounter (Signed)
The brand Symbicort is covered its just $312

## 2019-02-06 NOTE — Telephone Encounter (Signed)
Can we try the generic symbicort 160 through her pharmacy please?

## 2019-02-06 NOTE — Telephone Encounter (Signed)
Spoke with pharmacy- ins prefers brand symbicort. Generic not covered. They ran the medicare coupon card by itself without insurance and it is free for the 30 day trial. I do not believe this covers any refills although.

## 2019-02-06 NOTE — Telephone Encounter (Signed)
I'm confused. Is Symbicort covered by her insurance? Lets get her the free 30 days for sure please. Thanks

## 2019-02-17 MED ORDER — DULERA 200-5 MCG/ACT IN AERO: 2.0000 | INHALATION_SPRAY | Freq: Two times a day (BID) | RESPIRATORY_TRACT | 5 refills | Status: DC

## 2019-02-17 NOTE — Addendum Note (Signed)
Addended by: Katherina Right D on: 02/17/2019 03:15 PM   Modules accepted: Orders

## 2019-02-17 NOTE — Telephone Encounter (Signed)
Pharmacy ran [place] with insurance and it is costing $334 so about the same as the Symbicort. She has to met her deductible before her medication expense decreases.

## 2019-02-24 ENCOUNTER — Ambulatory Visit: Payer: Medicare PPO | Admitting: Family Medicine

## 2019-02-25 ENCOUNTER — Ambulatory Visit (INDEPENDENT_AMBULATORY_CARE_PROVIDER_SITE_OTHER): Payer: Medicare PPO | Admitting: Family Medicine

## 2019-02-25 ENCOUNTER — Other Ambulatory Visit: Payer: Self-pay

## 2019-02-25 ENCOUNTER — Encounter: Payer: Self-pay | Admitting: Family Medicine

## 2019-02-25 DIAGNOSIS — M7542 Impingement syndrome of left shoulder: Secondary | ICD-10-CM

## 2019-02-25 NOTE — Progress Notes (Signed)
Katelyn Fisher - 68 y.o. female MRN [id number]  Date of birth: 10-17-1950  SUBJECTIVE:  Including CC & ROS.  Chief Complaint  Patient presents with  . Follow-up    follow up for left shoulder    Katelyn Fisher is a 68 y.o. female that is following up for her left shoulder pain.  She received a subacromial injection has improvement of her symptoms.  She takes the Pennsaid and uses Vimovo intermittently.  Pain is mild if it does occur and intermittent.  Is localized to the shoulder.   Review of Systems  Constitutional: Negative for fever.  HENT: Negative for congestion.   Respiratory: Negative for cough.   Cardiovascular: Negative for chest pain.  Gastrointestinal: Negative for abdominal pain.  Musculoskeletal: Positive for arthralgias.  Skin: Negative for color change.  Neurological: Negative for weakness.  Hematological: Negative for adenopathy.    HISTORY: Past Medical, Surgical, Social, and Family History Reviewed & Updated per EMR.   Pertinent Historical Findings include:  Past Medical History:  Diagnosis Date  . Anxiety   . Arthritis    back  . Asthma   . Benign paroxysmal positional vertigo 11/01/2012  . Cancer ([place]) 12/22/2014   endometrial  . Depression   . Endometrial cancer ([place]) 2015  . Family history of breast cancer   . Family history of colon cancer   . Family history of ovarian cancer   . Family history of prostate cancer   . GERD (gastroesophageal reflux disease)   . History of colonic diverticulitis   . Hx of colonic polyps   . Hyperlipidemia   . Hypertension   . 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  . Low back pain radiating to right leg 08/18/2012  . Obesity   . Osteopenia 09/2017   T score -1.2 FRAX 3.2% / 0.3%  . Personal history of chemotherapy 2015  . Radiation 12/17/14-01/03/15   vaginal brachytherapy  . Thyroid disease 06/01/2013    Past Surgical History:  Procedure Laterality Date  . ABDOMINAL HYSTERECTOMY  06/23/14   [facility], TRH/BSO  .  CHOLECYSTECTOMY  1991  . DILATION AND CURETTAGE OF UTERUS    . IR REMOVAL TUN ACCESS W/ PORT W/O FL MOD SED  04/30/2017  . TUBAL LIGATION      Allergies  Allergen Reactions  . Shellfish Allergy Hives, Shortness Of Breath and Swelling  . Oxycodone Swelling    Facial swelling and tightness  . Penicillins Swelling    Rash with welps  . Iodinated Diagnostic Agents Rash  . Sulfa Antibiotics Rash    Family History  Problem Relation Age of Onset  . Heart disease Maternal Aunt        x 3 -2 brothers  . Hypertension Maternal Aunt   . Breast cancer Maternal Aunt        maternal half; dx in her 78s  . Cancer Maternal Aunt        cervical  . Ovarian cancer Maternal Aunt        dx in her 74s  . Prostate cancer Father 33  . Diabetes Father   . Cancer Father        lung cancer, asbestos exposure  . Heart disease Brother   . Diabetes Brother   . Diabetes Mother   . Lupus Mother   . Heart disease Brother   . Hyperlipidemia Sister   . Heart disease Brother   . Diabetes Brother   . Colon polyps Brother   . Heart disease Brother   .  Diabetes Brother   . Colon polyps Brother   . Colon cancer Maternal Grandmother        dx in her 71s  . Leukemia Maternal Uncle 21  . Cancer Paternal Aunt        NOS- breast   . Prostate cancer Paternal Uncle        dx in his 23s  . Breast cancer Other        dx in her 71s     Social History   Socioeconomic History  . Marital status: Married    Spouse name: Not on file  . Number of children: 3  . Years of education: Not on file  . Highest education level: Not on file  Occupational History  . Occupation: retired Transport planner  . Financial resource strain: Not on file  . Food insecurity    Worry: Not on file    Inability: Not on file  . Transportation needs    Medical: Not on file    Non-medical: Not on file  Tobacco Use  . Smoking status: Never Smoker  . Smokeless tobacco: Never Used  Substance and Sexual Activity  .  Alcohol use: No    Alcohol/week: 0.0 standard drinks  . Drug use: No  . Sexual activity: Yes    Partners: Male    Comment: 1st intercourse- 65, partners- 76, married- 96 yrs   Lifestyle  . Physical activity    Days per week: Not on file    Minutes per session: Not on file  . Stress: Not on file  Relationships  . Social Herbalist on phone: Not on file    Gets together: Not on file    Attends religious service: Not on file    Active member of club or organization: Not on file    Attends meetings of clubs or organizations: Not on file    Relationship status: Not on file  . Intimate partner violence    Fear of current or ex partner: Not on file    Emotionally abused: Not on file    Physically abused: Not on file    Forced sexual activity: Not on file  Other Topics Concern  . Not on file  Social History Narrative   Married- 74 years   Never Smoked   Alcohol use-no   Drug use-no   Occupation: housewife   Caffeine use/day:  None   Does Patient Exercise:  yes     PHYSICAL EXAM:  VS: BP (!) 150/97   Pulse 83   Ht 5\' 3"  (1.6 m)   Wt 209 lb (94.8 kg)   BMI 37.02 kg/m  Physical Exam Gen: NAD, alert, cooperative with exam, well-appearing ENT: normal lips, normal nasal mucosa,  Eye: normal EOM, normal conjunctiva and lids CV:  no edema, +2 pedal pulses   Resp: no accessory muscle use, non-labored,  Skin: no rashes, no areas of induration  Neuro: normal tone, normal sensation to touch Psych:  normal insight, alert and oriented MSK:  Left shoulder: Normal active abduction and flexion. Normal strength resistance. Normal internal and external rotation. Normal empty can testing. Neurovascular intact     ASSESSMENT & PLAN:   Subacromial impingement of left shoulder Has had improvement with the subacromial injection.  Pain intermittent in nature. -Counseled on home exercise therapy and supportive care. -Pennsaid samples provided. -Could consider physical  therapy

## 2019-02-25 NOTE — Patient Instructions (Signed)
Good to see you Please try the exercises for the lower back  Please continue the shoulder exercises  Please continue the pennsaid if needed  Please send me a message in MyChart with any questions or updates.  Please see me back in 2-3 months.   --Dr. Raeford Razor

## 2019-02-25 NOTE — Progress Notes (Signed)
Medication Samples have been provided to the patient.  Drug name: Pennsaid       Strength: 2%       Qty: 2 Boxes  LOT: [id number]  Exp.Date: 08/2019  Dosing instructions: Use a peasize amount and rub gently.  The patient has been instructed regarding the correct time, dose, and frequency of taking this medication, including desired effects and most common side effects.   Sherrie George, MA 9:59 AM 02/25/2019

## 2019-02-25 NOTE — Assessment & Plan Note (Signed)
Has had improvement with the subacromial injection.  Pain intermittent in nature. -Counseled on home exercise therapy and supportive care. -Pennsaid samples provided. -Could consider physical therapy

## 2019-02-27 ENCOUNTER — Other Ambulatory Visit: Payer: Self-pay | Admitting: Family Medicine

## 2019-02-27 ENCOUNTER — Encounter: Payer: Self-pay | Admitting: Family Medicine

## 2019-02-27 DIAGNOSIS — E1169 Type 2 diabetes mellitus with other specified complication: Secondary | ICD-10-CM

## 2019-02-27 DIAGNOSIS — E785 Hyperlipidemia, unspecified: Secondary | ICD-10-CM

## 2019-02-27 DIAGNOSIS — I10 Essential (primary) hypertension: Secondary | ICD-10-CM

## 2019-03-05 NOTE — Telephone Encounter (Signed)
She will contact us when she needs more samples.

## 2019-03-05 NOTE — Telephone Encounter (Signed)
We can keep giving her samples as long as we have them until she meets her deductible

## 2019-03-05 NOTE — Telephone Encounter (Signed)
I relayed Katelyn Fisher's message to pt. She was very grateful.

## 2019-03-06 ENCOUNTER — Other Ambulatory Visit: Payer: Self-pay

## 2019-03-06 ENCOUNTER — Other Ambulatory Visit (INDEPENDENT_AMBULATORY_CARE_PROVIDER_SITE_OTHER): Payer: Medicare PPO

## 2019-03-06 DIAGNOSIS — E1169 Type 2 diabetes mellitus with other specified complication: Secondary | ICD-10-CM | POA: Diagnosis not present

## 2019-03-06 DIAGNOSIS — I10 Essential (primary) hypertension: Secondary | ICD-10-CM | POA: Diagnosis not present

## 2019-03-06 DIAGNOSIS — E785 Hyperlipidemia, unspecified: Secondary | ICD-10-CM

## 2019-03-06 DIAGNOSIS — E669 Obesity, unspecified: Secondary | ICD-10-CM

## 2019-03-06 LAB — COMPREHENSIVE METABOLIC PANEL
ALT: 20 U/L (ref 0–35)
AST: 17 U/L (ref 0–37)
Albumin: 3.9 g/dL (ref 3.5–5.2)
Alkaline Phosphatase: 114 U/L (ref 39–117)
BUN: 15 mg/dL (ref 6–23)
CO2: 32 mEq/L (ref 19–32)
Calcium: 9.6 mg/dL (ref 8.4–10.5)
Chloride: 101 mEq/L (ref 96–112)
Creatinine, Ser: 0.75 mg/dL (ref 0.40–1.20)
GFR: 93.21 mL/min (ref >60.00)
Glucose, Bld: 95 mg/dL (ref 70–99)
Potassium: 3.9 mEq/L (ref 3.5–5.1)
Sodium: 140 mEq/L (ref 135–145)
Total Bilirubin: 0.4 mg/dL (ref 0.2–1.2)
Total Protein: 7 g/dL (ref 6.0–8.3)

## 2019-03-06 LAB — LIPID PANEL
Cholesterol: 179 mg/dL (ref 0–200)
HDL: 68.2 mg/dL (ref >39.00)
LDL Cholesterol: 97 mg/dL (ref 0–99)
NonHDL: 110.84
Total CHOL/HDL Ratio: 3
Triglycerides: 68 mg/dL (ref 0.0–149.0)
VLDL: 13.6 mg/dL (ref 0.0–40.0)

## 2019-03-06 LAB — MICROALBUMIN / CREATININE URINE RATIO
Creatinine,U: 24.3 mg/dL
Microalb Creat Ratio: 2.9 mg/g (ref 0.0–30.0)
Microalb, Ur: <0.7 mg/dL (ref 0.0–1.9)

## 2019-03-06 LAB — HEMOGLOBIN A1C: Hgb A1c MFr Bld: 6.6 % — ABNORMAL HIGH (ref 4.6–6.5)

## 2019-03-09 ENCOUNTER — Other Ambulatory Visit: Payer: Self-pay | Admitting: Family Medicine

## 2019-03-09 DIAGNOSIS — E1169 Type 2 diabetes mellitus with other specified complication: Secondary | ICD-10-CM

## 2019-03-12 ENCOUNTER — Other Ambulatory Visit: Payer: Self-pay

## 2019-03-13 ENCOUNTER — Ambulatory Visit (INDEPENDENT_AMBULATORY_CARE_PROVIDER_SITE_OTHER): Payer: Medicare PPO | Admitting: Family Medicine

## 2019-03-13 ENCOUNTER — Encounter: Payer: Self-pay | Admitting: Family Medicine

## 2019-03-13 VITALS — BP 128/80 | HR 87 | Temp 96.2°F | Ht 62.0 in | Wt 221.0 lb

## 2019-03-13 DIAGNOSIS — E669 Obesity, unspecified: Secondary | ICD-10-CM

## 2019-03-13 DIAGNOSIS — B353 Tinea pedis: Secondary | ICD-10-CM | POA: Diagnosis not present

## 2019-03-13 DIAGNOSIS — E1169 Type 2 diabetes mellitus with other specified complication: Secondary | ICD-10-CM | POA: Diagnosis not present

## 2019-03-13 DIAGNOSIS — E785 Hyperlipidemia, unspecified: Secondary | ICD-10-CM

## 2019-03-13 DIAGNOSIS — I10 Essential (primary) hypertension: Secondary | ICD-10-CM | POA: Diagnosis not present

## 2019-03-13 MED ORDER — KETOCONAZOLE 2 % EX CREA: 1.0000 "application " | TOPICAL_CREAM | Freq: Every day | CUTANEOUS | 0 refills | Status: DC

## 2019-03-13 NOTE — Progress Notes (Signed)
Subjective:   Chief Complaint  Patient presents with  . Follow-up    Katelyn Fisher is a 69 y.o. female here for follow-up of diabetes.   Katelyn Fisher's self monitored glucose range is low 100's. Patient denies hypoglycemic reactions. She checks her glucose levels every other day. Patient does not require insulin.   Medications include: diet controlled Exercise: walking  Hypertension Patient presents for hypertension follow up. She does not monitor home blood pressures. She is compliant with medications- lotensin-hct 20-25 mg/d, Norvasc 10 mg/d. Patient has these side effects of medication: none She is adhering to a healthy diet overall. Exercise: walking  Hyperlipidemia Patient presents for hyperlipidemia follow up. Currently being treated with Lipitor 40 mg/d and compliance with treatment thus far has been excellent She denies myalgias. She is adhering to a healthy diet. Exercise: walking The patient is not known to have coexisting coronary artery disease.    Past Medical History:  Diagnosis Date  . Anxiety   . Arthritis    back  . Asthma   . Benign paroxysmal positional vertigo 11/01/2012  . Cancer ([place]) 12/22/2014   endometrial  . Depression   . Endometrial cancer ([place]) 2015  . Family history of breast cancer   . Family history of colon cancer   . Family history of ovarian cancer   . Family history of prostate cancer   . GERD (gastroesophageal reflux disease)   . History of colonic diverticulitis   . Hx of colonic polyps   . Hyperlipidemia   . Hypertension   . 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  . Low back pain radiating to right leg 08/18/2012  . Obesity   . Osteopenia 09/2017   T score -1.2 FRAX 3.2% / 0.3%  . Personal history of chemotherapy 2015  . Radiation 12/17/14-01/03/15   vaginal brachytherapy  . Thyroid disease 06/01/2013     Related testing: Date of retinal exam Done Pneumovax: done  Review of Systems: Pulmonary:  No SOB Cardiovascular:  No  chest pain  Objective:  BP 128/80 (BP Location: Left Arm, Patient Position: Sitting, Cuff Size: Large)   Pulse 87   Temp (!) 96.2 F (35.7 C) (Temporal)   Ht 5\' 2"  (1.575 m)   Wt 221 lb (100.2 kg)   SpO2 97%   BMI 40.42 kg/m  General:  Well developed, well nourished, in no apparent distress Skin: macerated skin between 4th and 5th toes b/l feet; otherwise warm, no pallor or diaphoresis Head:  Normocephalic, atraumatic Eyes:  Pupils equal and round, sclera anicteric without injection  Lungs:  CTAB, no access msc use Cardio:  RRR, no bruits, no LE edema Musculoskeletal:  Symmetrical muscle groups noted without atrophy or deformity Neuro:  Sensation intact to pinprick on feet Psych: Age appropriate judgment and insight  Assessment:   Diabetes mellitus type 2 in obese ([place]) - Plan: [facility] DIABETES FOOT EXAM  Essential hypertension  Hyperlipidemia, unspecified hyperlipidemia type  Tinea pedis of both feet - Plan: ketoconazole (NIZORAL) 2 % cream  6 weeks total  Plan:  1- stop checking sugars, counseled on diet and exercise. 2- cont meds 3- cont statin 4- topical cream for 6 weeks. F/u in 6 mo for CPE. The patient voiced understanding and agreement to the plan.  [place], DO 03/13/19 7:43 AM

## 2019-03-13 NOTE — Patient Instructions (Signed)
The new Shingrix vaccine (for shingles) is a 2 shot series. It can make people feel low energy, achy and almost like they have the flu for 48 hours after injection. Please plan accordingly when deciding on when to get this shot. Call your pharmacy for an appointment to get this. The second shot of the series is less severe regarding the side effects, but it still lasts 48 hours.   Keep the diet clean and stay active.  You can stop checking your sugars at home unless you feel "off".  I recommend getting the flu shot in mid October. This suggestion would change if the CDC comes out with a different recommendation.   Let us know if you need anything.

## 2019-03-25 NOTE — Progress Notes (Signed)
GYNECOLOGIC ONCOLOGY OFFICE NOTE   Chief Complaint: Uterine serous endometrial cancer. Surveillance   Assessment/Plan: Katelyn Fisher is a 68 y.o. woman with pre op diagnosis of grade 1 endometrial cancer . 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   6//6 cycles adjuvant platinum and taxane chemotherapy and  vaginal brachytherapy completed 12/2014 No evidence of disease F/U 02/2019 for serial examination  Follow-up with Dr. Alvy Bimler as scheduled. Follow-up with Dr. Dellis Filbert every 6 months  History of Present Illness: Katelyn Fisher is a 68 y.o. who presented with a two-week history of postmenopausal bleeding. A pelvic ultrasound showed fibroids and a thickened endometrial stripe. She subsequently underwent an endometrial biopsy which was concerning for at least grade 1 endometrioid adenocarcinoma.   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 [place] 05-07-14 reportedly had thickened endometrial stripe, with 3 fibroids largest 5x 5x 3.5 cm; endometrial biopsy 05-07-14 reportedly was concerning for at least FIGO grade 1 endometrial carcinoma   06/23/2014 Surgery   Surgery was robotic hysterectomy, BSO, bilateral pelvic and para-aortic nodes by Dr Katelyn Fisher at [facility] on 06-23-14   06/23/2014 Pathology Results   Pathology [phone]) found mixed high grade carcinoma (serous 50% and endometrioid 50%) FIGO grade 3, involving inner half of myometrium with depth 5 mm where wall 3 cm thick, no serosal or lower uterine segment involvement, no cervical or adnexal involvement, LVSI present, 0/15 nodes involved   07/30/2014 PET scan   PET/ CT at [facility] 07-30-14 had no findings of concern for distant disease    08/16/2014 Procedure   Ultrasound and fluoroscopically guided right internal jugular single lumen power port catheter insertion. Tip in the SVC/RA junction. Catheter ready for use.   08/24/2014 - 12/06/2014 Chemotherapy   She received 6 cycles of carbo/Taxol   11/29/2014 Genetic Testing   Genetics testing normal 11-2014 (comprehensive cancer panel by [facility])   12/17/2014 - 01/03/2015 Radiation Therapy   Radiation treatment dates:   12/17/2014, 12/22/2014, 12/27/2014, 12/31/2014, 01/03/2015  Site/dose:   Proximal 4.5 cm of Vaginal cuff / 30 Gy in 5 fractions Rx to 0.1cm submucosal depth  Beams/energy:   HDR brachytherapy/ Ir 192   03/03/2015 Imaging   Interval hysterectomy. No evidence of local recurrence or metastatic disease. 2. No acute abdominal pelvic findings. 3. Distal colonic diverticulosis with possible rectal wall thickening, possibly related to prior radiation therapy. Correlate clinically.   11/20/2017 Imaging   C/O vaginal pain CT A/P [facility] IMPRESSION: 1. No acute process or explanation for pelvic pain. 2. Hysterectomy. No evidence of pelvic metastasis.     INTERVAL History  Doing well.   No vaginal bleeding,  , no abdominal bloating shortness of breath, is very happy.  Has a Arts administrator  Past Medical History:  Past Medical History   Past Medical History   Diagnosis  Date   .  Asthma    .  Morbid obesity ([facility])      BMI 44   .  Hypertension    .  GERD (gastroesophageal reflux disease)    .  Arthritis    .  Depression    .  Back pain    .  Thyroid disease  05/2013   .  Vertigo  10/2012  benign paroxysmal positional       Past Surgical History:  Past Surgical History   Past Surgical History   Procedure  Laterality  Date   .  Dilation and curettage of uterus   2102     for PMB   .  Hysteroscopy   2012     with resection of fibroids   .  Tubal ligation     .  Laparoscopic cholecystectomy     .  Pr laparoscopy w tot hysterectuterus <=250 gram w  tube/ovary  Midline  06/23/2014     Procedure: ROBOTIC LAPAROSCOPY, SURGICAL, WITH TOTAL HYSTERECTOMY, FOR UTERUS 250 G OR LESS; W/REMOVAL TUBE(S) AND/OR OVARY(S); Surgeon: Devra Dopp, MD; Location: MAIN OR [facility]; Service: Gynecology Oncology   .  Pr lap,pelvic lymphadenectomy/bx  Bilateral  06/23/2014     Procedure: ROBOTIC LAPAROSCOPY, SURGICAL; WITH BILATERAL TOTAL PELVIC LYMPHADENECTOMY PERI-AORTIC LYMPH NODE SAMPLE, SGL/MULT; Surgeon: Devra Dopp, MD; Location: MAIN OR [facility]; Service: Gynecology Oncology      Colonoscopy 10/2016 with removal of adenomatous polyps.    Family History:  Family History   Family History   Problem  Relation  Age of Onset   .  Heart disease  Maternal Aunt    .  Lung cancer  Father    .  Cancer  Maternal Aunt  23     Breast   .  Cancer  Maternal Aunt  94     Breast   .  Diabetes  Mother    .  Lupus  Mother    .  Diabetes  Father    .  Prostate cancer  Father    .  Diabetes  Brother    .  Heart disease  Brother      ROS: No visual changes, no nausea or vomiting, no sensory or motor changes, no flank pain, no extremity edema, no incontinence, no vaginal , rectal or bladder bleeding.   No change in bowel or bladder habits, exercising more,  otherwise 10 point ROS negative.   Physical Exam: BP 118/74 (BP Location: Right Arm, Patient Position: Sitting)   Pulse 90   Temp 98.7 F (37.1 C) (Temporal)   Resp 18   Ht 5\' 2"  (1.575 m)   Wt 220 lb 2 oz (99.8 kg)   SpO2 100%   BMI 40.26 kg/m   Wt Readings from Last 3 Encounters:  03/26/19 220 lb 2 oz (99.8 kg)  03/13/19 221 lb (100.2 kg)  02/25/19 209 lb (94.8 kg)   WD female in NAD Chest: CTA    Back: No CVAT Abdomen: Obese. Soft, nondistended, nontender to palpation. No masses or hepatosplenomegaly appreciated. Port sites intact, no tenderness or masses  Pelvic:  Nl EGBUS, no massess, no discharge no bleeding, atropic vagina.  No cul de sac masses. No vaginal masses or tenderness,  Rectal:   Good tone no masses. Back:  No CVAT LN:  No cervical supraclavicular or inguinal adenopathy Ext No CCE

## 2019-03-26 ENCOUNTER — Other Ambulatory Visit: Payer: Self-pay

## 2019-03-26 ENCOUNTER — Inpatient Hospital Stay: Payer: Medicare PPO | Attending: Gynecologic Oncology | Admitting: Gynecologic Oncology

## 2019-03-26 VITALS — BP 118/74 | HR 90 | Temp 98.7°F | Resp 18 | Ht 62.0 in | Wt 220.1 lb

## 2019-03-26 DIAGNOSIS — Z90722 Acquired absence of ovaries, bilateral: Secondary | ICD-10-CM | POA: Insufficient documentation

## 2019-03-26 DIAGNOSIS — J45909 Unspecified asthma, uncomplicated: Secondary | ICD-10-CM | POA: Insufficient documentation

## 2019-03-26 DIAGNOSIS — E079 Disorder of thyroid, unspecified: Secondary | ICD-10-CM | POA: Insufficient documentation

## 2019-03-26 DIAGNOSIS — Z923 Personal history of irradiation: Secondary | ICD-10-CM | POA: Diagnosis not present

## 2019-03-26 DIAGNOSIS — Z90710 Acquired absence of both cervix and uterus: Secondary | ICD-10-CM | POA: Diagnosis not present

## 2019-03-26 DIAGNOSIS — C541 Malignant neoplasm of endometrium: Secondary | ICD-10-CM | POA: Insufficient documentation

## 2019-03-26 DIAGNOSIS — K219 Gastro-esophageal reflux disease without esophagitis: Secondary | ICD-10-CM | POA: Insufficient documentation

## 2019-03-26 DIAGNOSIS — I10 Essential (primary) hypertension: Secondary | ICD-10-CM | POA: Insufficient documentation

## 2019-03-26 DIAGNOSIS — Z6841 Body Mass Index (BMI) 40.0 and over, adult: Secondary | ICD-10-CM | POA: Diagnosis not present

## 2019-03-26 DIAGNOSIS — Z9221 Personal history of antineoplastic chemotherapy: Secondary | ICD-10-CM | POA: Insufficient documentation

## 2019-03-26 DIAGNOSIS — M1990 Unspecified osteoarthritis, unspecified site: Secondary | ICD-10-CM | POA: Insufficient documentation

## 2019-03-26 NOTE — Patient Instructions (Signed)
Follow up with Dr. Dellis Filbert.

## 2019-03-27 ENCOUNTER — Other Ambulatory Visit: Payer: Self-pay | Admitting: Family Medicine

## 2019-03-27 DIAGNOSIS — I10 Essential (primary) hypertension: Secondary | ICD-10-CM

## 2019-04-19 ENCOUNTER — Encounter: Payer: Self-pay | Admitting: Family Medicine

## 2019-04-27 ENCOUNTER — Ambulatory Visit (INDEPENDENT_AMBULATORY_CARE_PROVIDER_SITE_OTHER): Payer: Medicare PPO | Admitting: Family Medicine

## 2019-04-27 ENCOUNTER — Other Ambulatory Visit: Payer: Self-pay

## 2019-04-27 ENCOUNTER — Encounter: Payer: Self-pay | Admitting: Family Medicine

## 2019-04-27 DIAGNOSIS — M545 Low back pain, unspecified: Secondary | ICD-10-CM

## 2019-04-27 DIAGNOSIS — M25561 Pain in right knee: Secondary | ICD-10-CM

## 2019-04-27 DIAGNOSIS — M7542 Impingement syndrome of left shoulder: Secondary | ICD-10-CM

## 2019-04-27 DIAGNOSIS — M79604 Pain in right leg: Secondary | ICD-10-CM

## 2019-04-27 MED ORDER — PREDNISONE 5 MG PO TABS: ORAL_TABLET | ORAL | 0 refills | Status: DC

## 2019-04-27 MED ORDER — BACLOFEN 5 MG PO TABS: 5.0000 mg | ORAL_TABLET | Freq: Two times a day (BID) | ORAL | 0 refills | Status: DC | PRN

## 2019-04-27 NOTE — Assessment & Plan Note (Signed)
Acutely exacerbated with recent stumble.  Does have an anterior listhesis appreciated on CT scan.  This is likely related to a spasm in the lower back with mild radicular type symptoms.  Not likely significant herniation is more associated with irritation of the nerve from the spasm. -Prednisone.  Independent review of the last A1c was 6.6. -Baclofen and counseled on its use. -Counseled on home exercise therapy program and supportive care. -Consider physical therapy or imaging if no improvement.

## 2019-04-27 NOTE — Assessment & Plan Note (Signed)
Acute exacerbation of right knee.  Seems to have had a twisting motion.  Likely meniscal irritation.  No effusion appreciated has good range of motion. -Hinged knee brace. -Counseled on home exercise therapy and supportive care. -Could consider x-ray or injection if no improvement.

## 2019-04-27 NOTE — Assessment & Plan Note (Signed)
Improvement of her symptoms.  She did fall and hit her shoulder.  We will need to monitor if symptoms return -Counseled on home exercise therapy and supportive care. -Would consider x-ray. -Consider physical therapy.

## 2019-04-27 NOTE — Patient Instructions (Signed)
Good to see you  Please try ice for the knee  Please try heat for the lower back  Please try the exercises  Please try the medicine We will call you about the brace  Please send me a message in MyChart with any questions or updates.  Please see me back in 4 weeks or sooner if needed.   --Dr. Raeford Razor

## 2019-04-27 NOTE — Progress Notes (Signed)
Katelyn Fisher - 68 y.o. female MRN [id number]  Date of birth: 04-28-1951  SUBJECTIVE:  Including CC & ROS.  Chief Complaint  Patient presents with  . Follow-up    follow up for left shoulder  . Back Pain    right-sided low back     Katelyn Fisher is a 68 y.o. female that is following up for her left shoulder in presenting with new low back pain and right knee pain.  The shoulder has done well since the injection on 7/7.  She had a fall about a week ago.  She had a twist of the right ankle and knee and fell back towards the left side.  She did not fall down completely.  Since that time she has had this acute lower back pain with radicular symptoms down the right lateral aspect of the leg.  These are intermittent in nature.  Denies any saddle anesthesia or urinary incontinence.  The pain is moderate to severe.  Seems to be worse when she is sitting in specific positions and transitioning to standing.  Right knee pain is intermittent as well.  It is worse with ambulation.  Occurring over the medial joint line.  Denies any mechanical symptoms.  Denies any swelling.  Pain is throbbing and achy.  No prior history of injury or surgery.  Independent review of the CT abdomen and pelvis from 2016 shows a grade 1 anterior listhesis.   Review of Systems  Constitutional: Negative for fever.  HENT: Negative for congestion.   Respiratory: Negative for cough.   Cardiovascular: Negative for chest pain.  Gastrointestinal: Negative for abdominal pain.  Musculoskeletal: Positive for arthralgias and back pain.  Skin: Negative for color change.  Neurological: Negative for weakness.  Hematological: Negative for adenopathy.    HISTORY: Past Medical, Surgical, Social, and Family History Reviewed & Updated per EMR.   Pertinent Historical Findings include:  Past Medical History:  Diagnosis Date  . Anxiety   . Arthritis    back  . Asthma   . Benign paroxysmal positional vertigo 11/01/2012  . Cancer  ([place]) 12/22/2014   endometrial  . Depression   . Endometrial cancer ([place]) 2015  . Family history of breast cancer   . Family history of colon cancer   . Family history of ovarian cancer   . Family history of prostate cancer   . GERD (gastroesophageal reflux disease)   . History of colonic diverticulitis   . Hx of colonic polyps   . Hyperlipidemia   . Hypertension   . 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  . Low back pain radiating to right leg 08/18/2012  . Obesity   . Osteopenia 09/2017   T score -1.2 FRAX 3.2% / 0.3%  . Personal history of chemotherapy 2015  . Radiation 12/17/14-01/03/15   vaginal brachytherapy  . Thyroid disease 06/01/2013    Past Surgical History:  Procedure Laterality Date  . ABDOMINAL HYSTERECTOMY  06/23/14   [facility], TRH/BSO  . CHOLECYSTECTOMY  1991  . DILATION AND CURETTAGE OF UTERUS    . IR REMOVAL TUN ACCESS W/ PORT W/O FL MOD SED  04/30/2017  . TUBAL LIGATION      Allergies  Allergen Reactions  . Shellfish Allergy Hives, Shortness Of Breath and Swelling  . Oxycodone Swelling    Facial swelling and tightness  . Penicillins Swelling    Rash with welps  . Iodinated Diagnostic Agents Rash  . Sulfa Antibiotics Rash    Family History  Problem Relation Age of  Onset  . Heart disease Maternal Aunt        x 3 -2 brothers  . Hypertension Maternal Aunt   . Breast cancer Maternal Aunt        maternal half; dx in her 49s  . Cancer Maternal Aunt        cervical  . Ovarian cancer Maternal Aunt        dx in her 84s  . Prostate cancer Father 57  . Diabetes Father   . Cancer Father        lung cancer, asbestos exposure  . Heart disease Brother   . Diabetes Brother   . Diabetes Mother   . Lupus Mother   . Heart disease Brother   . Hyperlipidemia Sister   . Heart disease Brother   . Diabetes Brother   . Colon polyps Brother   . Heart disease Brother   . Diabetes Brother   . Colon polyps Brother   . Colon cancer Maternal Grandmother        dx in her  17s  . Leukemia Maternal Uncle 21  . Cancer Paternal Aunt        NOS- breast   . Prostate cancer Paternal Uncle        dx in his 38s  . Breast cancer Other        dx in her 102s     Social History   Socioeconomic History  . Marital status: Married    Spouse name: Not on file  . Number of children: 3  . Years of education: Not on file  . Highest education level: Not on file  Occupational History  . Occupation: retired Transport planner  . Financial resource strain: Not on file  . Food insecurity    Worry: Not on file    Inability: Not on file  . Transportation needs    Medical: Not on file    Non-medical: Not on file  Tobacco Use  . Smoking status: Never Smoker  . Smokeless tobacco: Never Used  Substance and Sexual Activity  . Alcohol use: No    Alcohol/week: 0.0 standard drinks  . Drug use: No  . Sexual activity: Yes    Partners: Male    Comment: 1st intercourse- 61, partners- 11, married- 46 yrs   Lifestyle  . Physical activity    Days per week: Not on file    Minutes per session: Not on file  . Stress: Not on file  Relationships  . Social Herbalist on phone: Not on file    Gets together: Not on file    Attends religious service: Not on file    Active member of club or organization: Not on file    Attends meetings of clubs or organizations: Not on file    Relationship status: Not on file  . Intimate partner violence    Fear of current or ex partner: Not on file    Emotionally abused: Not on file    Physically abused: Not on file    Forced sexual activity: Not on file  Other Topics Concern  . Not on file  Social History Narrative   Married- 46 years   Never Smoked   Alcohol use-no   Drug use-no   Occupation: housewife   Caffeine use/day:  None   Does Patient Exercise:  yes     PHYSICAL EXAM:  VS: BP 138/88   Pulse 93   Ht 5\' 2"  (1.575  m)   Wt 213 lb (96.6 kg)   BMI 38.96 kg/m  Physical Exam Gen: NAD, alert, cooperative with  exam, well-appearing ENT: normal lips, normal nasal mucosa,  Eye: normal EOM, normal conjunctiva and lids CV:  no edema, +2 pedal pulses   Resp: no accessory muscle use, non-labored,   Skin: no rashes, no areas of induration  Neuro: normal tone, normal sensation to touch Psych:  normal insight, alert and oriented MSK:  Left shoulder: Normal active flexion and abduction. Normal external and internal rotation. Normal empty can testing. Back/right hip: Tenderness to palpation over the paraspinal muscles near the midline. No midline lumbar tenderness. Some tenderness to palpation over the muscles just superior to the iliac crest. Mild tenderness palpation of the greater trochanter. Normal internal and external rotation of the hips. Normal strength resistance with hip flexion, knee flexion extension. Negative straight leg raise. Right knee: No swelling. Tenderness palpation of the medial joint line. No pain over the femoral condyles. Normal range of motion. Normal strength resistance. Positive McMurray's test. Negative patellar grind. Neurovascular intact     ASSESSMENT & PLAN:   Subacromial impingement of left shoulder Improvement of her symptoms.  She did fall and hit her shoulder.  We will need to monitor if symptoms return -Counseled on home exercise therapy and supportive care. -Would consider x-ray. -Consider physical therapy.  Acute pain of right knee Acute exacerbation of right knee.  Seems to have had a twisting motion.  Likely meniscal irritation.  No effusion appreciated has good range of motion. -Hinged knee brace. -Counseled on home exercise therapy and supportive care. -Could consider x-ray or injection if no improvement.  Low back pain radiating to right leg Acutely exacerbated with recent stumble.  Does have an anterior listhesis appreciated on CT scan.  This is likely related to a spasm in the lower back with mild radicular type symptoms.  Not likely  significant herniation is more associated with irritation of the nerve from the spasm. -Prednisone.  Independent review of the last A1c was 6.6. -Baclofen and counseled on its use. -Counseled on home exercise therapy program and supportive care. -Consider physical therapy or imaging if no improvement.

## 2019-04-29 ENCOUNTER — Encounter: Payer: Self-pay | Admitting: Gynecology

## 2019-04-30 ENCOUNTER — Other Ambulatory Visit: Payer: Self-pay

## 2019-04-30 ENCOUNTER — Ambulatory Visit (INDEPENDENT_AMBULATORY_CARE_PROVIDER_SITE_OTHER): Payer: Medicare PPO

## 2019-04-30 DIAGNOSIS — Z23 Encounter for immunization: Secondary | ICD-10-CM

## 2019-05-11 ENCOUNTER — Encounter: Payer: Self-pay | Admitting: Family Medicine

## 2019-05-11 ENCOUNTER — Telehealth: Payer: Self-pay | Admitting: Family Medicine

## 2019-05-11 ENCOUNTER — Ambulatory Visit: Payer: Medicare PPO | Admitting: Family Medicine

## 2019-05-11 ENCOUNTER — Ambulatory Visit (INDEPENDENT_AMBULATORY_CARE_PROVIDER_SITE_OTHER): Payer: Medicare PPO | Admitting: Family Medicine

## 2019-05-11 ENCOUNTER — Other Ambulatory Visit: Payer: Self-pay

## 2019-05-11 ENCOUNTER — Ambulatory Visit (HOSPITAL_BASED_OUTPATIENT_CLINIC_OR_DEPARTMENT_OTHER)
Admission: RE | Admit: 2019-05-11 | Discharge: 2019-05-11 | Disposition: A | Discharge status: Home / Self Care | Payer: Medicare PPO | Source: Ambulatory Visit | Attending: Family Medicine | Admitting: Family Medicine

## 2019-05-11 VITALS — BP 138/94 | Ht 63.0 in | Wt 213.0 lb

## 2019-05-11 DIAGNOSIS — M545 Low back pain, unspecified: Secondary | ICD-10-CM

## 2019-05-11 DIAGNOSIS — M79604 Pain in right leg: Secondary | ICD-10-CM

## 2019-05-11 MED ORDER — TRAMADOL HCL 50 MG PO TABS: 50.0000 mg | ORAL_TABLET | Freq: Two times a day (BID) | ORAL | 0 refills | Status: DC | PRN

## 2019-05-11 NOTE — Assessment & Plan Note (Signed)
Acutely worsened.  Pain seems to be more related to spasm.  Has a anteriorlisthesis in the lower back that is likely complicating.  Radicular symptoms are less severe -X-ray -Tramadol. -Counseled on home exercise therapy and supportive care. -If no improvement can consider MRI.

## 2019-05-11 NOTE — Telephone Encounter (Signed)
Left VM of results for patient.   Rosemarie Ax, MD [facility] 05/11/2019, 1:21 PM

## 2019-05-11 NOTE — Progress Notes (Signed)
Katelyn Fisher - 68 y.o. female MRN [id number]  Date of birth: 09-24-1950  SUBJECTIVE:  Including CC & ROS.  Chief Complaint  Patient presents with  . Back Pain    midline low back     Katelyn Fisher is a 68 y.o. female that is presenting with acute worsening of her ongoing back pain.  She has tried prednisone and baclofen with limited improvement.  The pain seems most consistently occurring in the lower back.  She has concerned because she has a history of breast cancer and this may be contributing.  Intermittently has radicular symptoms down the right leg.  Pain seems to be more constant and severe in nature.  Denies any saddle anesthesia or urinary incontinence.  The pain is worse with any movement and especially with flexion.   Review of Systems  Constitutional: Negative for fever.  HENT: Negative for congestion.   Respiratory: Negative for cough.   Cardiovascular: Negative for chest pain.  Gastrointestinal: Negative for abdominal pain.  Musculoskeletal: Positive for arthralgias, back pain and gait problem.  Skin: Negative for color change.  Neurological: Negative for weakness.  Hematological: Negative for adenopathy.  Psychiatric/Behavioral: Negative for agitation.    HISTORY: Past Medical, Surgical, Social, and Family History Reviewed & Updated per EMR.   Pertinent Historical Findings include:  Past Medical History:  Diagnosis Date  . Anxiety   . Arthritis    back  . Asthma   . Benign paroxysmal positional vertigo 11/01/2012  . Cancer ([place]) 12/22/2014   endometrial  . Depression   . Endometrial cancer ([place]) 2015  . Family history of breast cancer   . Family history of colon cancer   . Family history of ovarian cancer   . Family history of prostate cancer   . GERD (gastroesophageal reflux disease)   . History of colonic diverticulitis   . Hx of colonic polyps   . Hyperlipidemia   . Hypertension   . 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  . Low back pain radiating to  right leg 08/18/2012  . Obesity   . Osteopenia 09/2017   T score -1.2 FRAX 3.2% / 0.3%  . Personal history of chemotherapy 2015  . Radiation 12/17/14-01/03/15   vaginal brachytherapy  . Thyroid disease 06/01/2013    Past Surgical History:  Procedure Laterality Date  . ABDOMINAL HYSTERECTOMY  06/23/14   [facility], TRH/BSO  . CHOLECYSTECTOMY  1991  . DILATION AND CURETTAGE OF UTERUS    . IR REMOVAL TUN ACCESS W/ PORT W/O FL MOD SED  04/30/2017  . TUBAL LIGATION      Allergies  Allergen Reactions  . Shellfish Allergy Hives, Shortness Of Breath and Swelling  . Oxycodone Swelling    Facial swelling and tightness  . Penicillins Swelling    Rash with welps  . Iodinated Diagnostic Agents Rash  . Sulfa Antibiotics Rash    Family History  Problem Relation Age of Onset  . Heart disease Maternal Aunt        x 3 -2 brothers  . Hypertension Maternal Aunt   . Breast cancer Maternal Aunt        maternal half; dx in her 5s  . Cancer Maternal Aunt        cervical  . Ovarian cancer Maternal Aunt        dx in her 27s  . Prostate cancer Father 51  . Diabetes Father   . Cancer Father        lung cancer, asbestos exposure  .  Heart disease Brother   . Diabetes Brother   . Diabetes Mother   . Lupus Mother   . Heart disease Brother   . Hyperlipidemia Sister   . Heart disease Brother   . Diabetes Brother   . Colon polyps Brother   . Heart disease Brother   . Diabetes Brother   . Colon polyps Brother   . Colon cancer Maternal Grandmother        dx in her 64s  . Leukemia Maternal Uncle 21  . Cancer Paternal Aunt        NOS- breast   . Prostate cancer Paternal Uncle        dx in his 51s  . Breast cancer Other        dx in her 45s     Social History   Socioeconomic History  . Marital status: Married    Spouse name: Not on file  . Number of children: 3  . Years of education: Not on file  . Highest education level: Not on file  Occupational History  . Occupation: retired  Transport planner  . Financial resource strain: Not on file  . Food insecurity    Worry: Not on file    Inability: Not on file  . Transportation needs    Medical: Not on file    Non-medical: Not on file  Tobacco Use  . Smoking status: Never Smoker  . Smokeless tobacco: Never Used  Substance and Sexual Activity  . Alcohol use: No    Alcohol/week: 0.0 standard drinks  . Drug use: No  . Sexual activity: Yes    Partners: Male    Comment: 1st intercourse- 52, partners- 76, married- 39 yrs   Lifestyle  . Physical activity    Days per week: Not on file    Minutes per session: Not on file  . Stress: Not on file  Relationships  . Social Herbalist on phone: Not on file    Gets together: Not on file    Attends religious service: Not on file    Active member of club or organization: Not on file    Attends meetings of clubs or organizations: Not on file    Relationship status: Not on file  . Intimate partner violence    Fear of current or ex partner: Not on file    Emotionally abused: Not on file    Physically abused: Not on file    Forced sexual activity: Not on file  Other Topics Concern  . Not on file  Social History Narrative   Married- 36 years   Never Smoked   Alcohol use-no   Drug use-no   Occupation: housewife   Caffeine use/day:  None   Does Patient Exercise:  yes     PHYSICAL EXAM:  VS: BP (!) 138/94   Ht 5\' 3"  (1.6 m)   Wt 213 lb (96.6 kg)   BMI 37.73 kg/m  Physical Exam Gen: NAD, alert, cooperative with exam, well-appearing ENT: normal lips, normal nasal mucosa,  Eye: normal EOM, normal conjunctiva and lids CV:  no edema, +2 pedal pulses   Resp: no accessory muscle use, non-labored,   Skin: no rashes, no areas of induration  Neuro: normal tone, normal sensation to touch Psych:  normal insight, alert and oriented MSK:  Back: No tenderness to palpation of the midline lumbar spine. Some tenderness palpation over the paraspinal muscles in  the lumbar region. Normal extension. Limited flexion  secondary to pain. Negative straight leg raise. Neurovascularly intact      ASSESSMENT & PLAN:   Low back pain radiating to right leg Acutely worsened.  Pain seems to be more related to spasm.  Has a anteriorlisthesis in the lower back that is likely complicating.  Radicular symptoms are less severe -X-ray -Tramadol. -Counseled on home exercise therapy and supportive care. -If no improvement can consider MRI.

## 2019-05-11 NOTE — Patient Instructions (Signed)
Good to see you Please try heat on the lower back   Please use the tramadol for severe pain as needed. Don't drive with this. Please let us know if you do not tolerate it.  I have made a referral to physical therapy Please send me a message in MyChart with any questions or updates.  Please see me back in 4 weeks.   --Dr. Raeford Razor

## 2019-05-12 ENCOUNTER — Encounter: Payer: Self-pay | Admitting: Family Medicine

## 2019-05-12 ENCOUNTER — Ambulatory Visit (INDEPENDENT_AMBULATORY_CARE_PROVIDER_SITE_OTHER): Payer: Medicare PPO | Admitting: Family Medicine

## 2019-05-12 VITALS — BP 140/96 | HR 85 | Temp 98.3°F | Resp 20 | Ht 61.0 in | Wt 229.1 lb

## 2019-05-12 DIAGNOSIS — J309 Allergic rhinitis, unspecified: Secondary | ICD-10-CM | POA: Diagnosis not present

## 2019-05-12 DIAGNOSIS — K219 Gastro-esophageal reflux disease without esophagitis: Secondary | ICD-10-CM

## 2019-05-12 DIAGNOSIS — Z8542 Personal history of malignant neoplasm of other parts of uterus: Secondary | ICD-10-CM

## 2019-05-12 DIAGNOSIS — J4540 Moderate persistent asthma, uncomplicated: Secondary | ICD-10-CM

## 2019-05-12 DIAGNOSIS — T7800XD Anaphylactic reaction due to unspecified food, subsequent encounter: Secondary | ICD-10-CM | POA: Diagnosis not present

## 2019-05-12 DIAGNOSIS — Z79899 Other long term (current) drug therapy: Secondary | ICD-10-CM

## 2019-05-12 DIAGNOSIS — E1140 Type 2 diabetes mellitus with diabetic neuropathy, unspecified: Secondary | ICD-10-CM

## 2019-05-12 MED ORDER — ALBUTEROL SULFATE HFA 108 (90 BASE) MCG/ACT IN AERS: INHALATION_SPRAY | RESPIRATORY_TRACT | 1 refills | Status: DC

## 2019-05-12 NOTE — Patient Instructions (Addendum)
Asthma Continue Symbicort 160-1 puff twice a day with a spacer to prevent cough and wheeze  For asthma flares, increase Symbicort 160 to 2 puffs twice a day with a spacer for 2 weeks or until cough and wheeze free, then return to 1 puff twice a day with a spacer ProAir 2 puffs every 4 hours if needed for wheezing or coughing spells You may use ProAir 2 puffs 5-15 minutes before exercise to decrease cough or wheeze  Allergic rhinitis Continue Zyrtec 10 mg-take 1 tablet once a day if needed for runny nose or itchy eyes Flonase 1-2 sprays in each nostril once a day as needed for a stuffy nose Consider nasal rinses once a day as needed to reduce nasal symptoms  Food allergy Continue avoiding shellfish.  If you have an allergic reaction take Benadryl 50 mg every 4 hours and if you have life-threatening symptoms inject with EpiPen 0.3 mg  Reflux Continue dietary and lifestyle modifications (as listed below)  Your blood pressure was slightly elevated at today's visit. Follow up with your primary care provider for your blood pressure  Call us if you are not doing well on this treatment plan  Follow up in 6 months or sooner if needed   Lifestyle Changes for Controlling GERD When you have GERD, stomach acid feels as if it's backing up toward your mouth. Whether or not you take medication to control your GERD, your symptoms can often be improved with lifestyle changes.   Raise Your Head  Reflux is more likely to strike when you're lying down flat, because stomach fluid can  flow backward more easily. Raising the head of your bed 4-6 inches can help. To do this:  Slide blocks or books under the legs at the head of your bed. Or, place a wedge under  the mattress. Many foam stores can make a suitable wedge for you. The wedge  should run from your waist to the top of your head.  Don't just prop your head on several pillows. This increases pressure on your  stomach. It can make GERD  worse.  Watch Your Eating Habits Certain foods may increase the acid in your stomach or relax the lower esophageal sphincter, making GERD more likely. It's best to avoid the following:  Coffee, tea, and carbonated drinks (with and without caffeine)  Fatty, fried, or spicy food  Mint, chocolate, onions, and tomatoes  Any other foods that seem to irritate your stomach or cause you pain  Relieve the Pressure  Eat smaller meals, even if you have to eat more often.  Don't lie down right after you eat. Wait a few hours for your stomach to empty.  Avoid tight belts and tight-fitting clothes.  Lose excess weight.  Tobacco and Alcohol  Avoid smoking tobacco and drinking alcohol. They can make GERD symptoms worse.

## 2019-05-12 NOTE — Progress Notes (Signed)
[address] [place] [address] Dept: [phone]  FOLLOW UP NOTE  Patient ID: Katelyn Fisher, female    DOB: August 05, 1950  Age: 68 y.o. MRN: [id number] Date of Office Visit: 05/12/2019  Assessment  Chief Complaint: Asthma  HPI Katelyn Fisher is a 68 year old female who presents to the clinic for a follow up visit. At today's visit, she reports her asthma has been well controlled with occasional intermittent wheeze with activity and occasional wheeze when lying down. She reports occasional cough producing clear mucus which she believes to be from post nasal drainage. She continues Symbicort 160-1 puff twice a day with a spacer and uses her albuterol infrequently. Allergic rhinitis is reported as well controlled with cetirizine and Flonase as needed. She is not currently using nasal saline rinses. She denies reflux and continues dietary and lifestyle modifications. She continues to avoid shellfish with no accidental ingestion since her last visit to this clinic. Her current medications are listed in the chart.    Drug Allergies:  Allergies  Allergen Reactions  . Shellfish Allergy Hives, Shortness Of Breath and Swelling  . Oxycodone Swelling    Facial swelling and tightness  . Penicillins Swelling    Rash with welps  . Iodinated Diagnostic Agents Rash  . Sulfa Antibiotics Rash    Physical Exam: BP (!) 140/96   Pulse 85   Temp 98.3 F (36.8 C) (Oral)   Resp 20   Ht 5\' 1"  (1.549 m)   Wt 229 lb 0.9 oz (103.9 kg)   SpO2 99%   BMI 43.28 kg/m    Physical Exam Vitals signs reviewed.  Constitutional:      Appearance: Normal appearance.  HENT:     Head: Normocephalic and atraumatic.     Nose:     Comments: Bilateral nares slightly erythematous with no nasal drainage noted. Pharynx normal. Ears normal. Eyes normal.    Mouth/Throat:     Pharynx: Oropharynx is clear.  Eyes:     Conjunctiva/sclera: Conjunctivae normal.  Neck:     Musculoskeletal: Normal range of  motion and neck supple.  Cardiovascular:     Rate and Rhythm: Normal rate and regular rhythm.     Heart sounds: Normal heart sounds. No murmur.  Pulmonary:     Effort: Pulmonary effort is normal.     Breath sounds: Normal breath sounds.     Comments: Lungs clear to auscultation Musculoskeletal: Normal range of motion.  Skin:    General: Skin is warm and dry.  Neurological:     Mental Status: She is alert and oriented to person, place, and time.  Psychiatric:        Mood and Affect: Mood normal.        Behavior: Behavior normal.        Thought Content: Thought content normal.        Judgment: Judgment normal.     Diagnostics: FVC 1.75, FEV1 1.33. Predicted FVC 2.13, predicted FEV1 1.65. Spirometry indicates normal ventilatory function.    Assessment and Plan: 1. Moderate persistent asthma without complication   2. Allergic rhinitis, unspecified seasonality, unspecified trigger   3. Gastroesophageal reflux disease without esophagitis   4. Anaphylactic shock due to food, subsequent encounter   5. Type 2 diabetes mellitus with diabetic neuropathy, without long-term current use of insulin ([place])   6. Current use of beta blocker   7. History of endometrial cancer     Meds ordered this encounter  Medications  . albuterol (  PROAIR HFA) 108 (90 Base) MCG/ACT inhaler    Sig: 2 puffs every 4 hours as needed for coughing or wheezing spells    Dispense:  18 g    Refill:  1    Patient Instructions  Asthma Continue Symbicort 160-1 puff twice a day with a spacer to prevent cough and wheeze  For asthma flares, increase Symbicort 160 to 2 puffs twice a day with a spacer for 2 weeks or until cough and wheeze free, then return to 1 puff twice a day with a spacer ProAir 2 puffs every 4 hours if needed for wheezing or coughing spells You may use ProAir 2 puffs 5-15 minutes before exercise to decrease cough or wheeze  Allergic rhinitis Continue Zyrtec 10 mg-take 1 tablet once a day if needed  for runny nose or itchy eyes Flonase 1-2 sprays in each nostril once a day as needed for a stuffy nose Consider nasal rinses once a day as needed to reduce nasal symptoms  Food allergy Continue avoiding shellfish.  If you have an allergic reaction take Benadryl 50 mg every 4 hours and if you have life-threatening symptoms inject with EpiPen 0.3 mg  Reflux Continue dietary and lifestyle modifications (as listed below)  Your blood pressure was slightly elevated at today's visit. Follow up with your primary care provider for your blood pressure  Call us if you are not doing well on this treatment plan  Follow up in 6 months or sooner if needed   Return in about 6 months (around 11/10/2019), or if symptoms worsen or fail to improve.   Thank you for the opportunity to care for this patient.  Please do not hesitate to contact me with questions.  Gareth Morgan, FNP [facility]  I have provided oversight concerning Gareth Morgan' evaluation and treatment of this patient's health issues addressed during today's encounter. I agree with the assessment and therapeutic plan as outlined in the note.   Thank you for the opportunity to care for this patient.  Please do not hesitate to contact me with questions.  Penne Lash, M.D.  [facility] [address] [place], [place] [address] [phone]

## 2019-05-25 ENCOUNTER — Ambulatory Visit: Payer: Medicare PPO | Admitting: Family Medicine

## 2019-05-28 ENCOUNTER — Encounter: Payer: Self-pay | Admitting: Family Medicine

## 2019-06-04 ENCOUNTER — Encounter: Payer: Self-pay | Admitting: Physical Therapy

## 2019-06-04 ENCOUNTER — Other Ambulatory Visit: Payer: Self-pay

## 2019-06-04 ENCOUNTER — Ambulatory Visit: Payer: Medicare PPO | Attending: Family Medicine | Admitting: Physical Therapy

## 2019-06-04 DIAGNOSIS — R262 Difficulty in walking, not elsewhere classified: Secondary | ICD-10-CM | POA: Insufficient documentation

## 2019-06-04 DIAGNOSIS — M5441 Lumbago with sciatica, right side: Secondary | ICD-10-CM | POA: Diagnosis not present

## 2019-06-04 DIAGNOSIS — R29898 Other symptoms and signs involving the musculoskeletal system: Secondary | ICD-10-CM

## 2019-06-04 DIAGNOSIS — R2689 Other abnormalities of gait and mobility: Secondary | ICD-10-CM | POA: Diagnosis not present

## 2019-06-04 NOTE — Therapy (Signed)
[place] [address]  [place], [place], [address] Phone: [phone]   Fax:  [phone]  Physical Therapy Evaluation  Patient Details  Name: Katelyn Fisher MRN: [id number] Date of Birth: 12-22-1950 Referring Provider (PT): [name], MD   Encounter Date: 06/04/2019  PT End of Session - 06/04/19 1151    Visit Number  1    Number of Visits  13    Date for PT Re-Evaluation  07/16/19    Authorization Type  [employer] [facility] [place] [facility]    PT Start Time  1102    PT Stop Time  1144    PT Time Calculation (min)  42 min    Activity Tolerance  Patient tolerated treatment well;Patient limited by pain    Behavior During Therapy  [facility] for tasks assessed/performed       Past Medical History:  Diagnosis Date  . Anxiety   . Arthritis    back  . Asthma   . Benign paroxysmal positional vertigo 11/01/2012  . Cancer ([place]) 12/22/2014   endometrial  . Depression   . Endometrial cancer ([place]) 2015  . Family history of breast cancer   . Family history of colon cancer   . Family history of ovarian cancer   . Family history of prostate cancer   . GERD (gastroesophageal reflux disease)   . History of colonic diverticulitis   . Hx of colonic polyps   . Hyperlipidemia   . Hypertension   . 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  . Low back pain radiating to right leg 08/18/2012  . Obesity   . Osteopenia 09/2017   T score -1.2 FRAX 3.2% / 0.3%  . Personal history of chemotherapy 2015  . Radiation 12/17/14-01/03/15   vaginal brachytherapy  . Thyroid disease 06/01/2013    Past Surgical History:  Procedure Laterality Date  . ABDOMINAL HYSTERECTOMY  06/23/14   [facility], TRH/BSO  . CHOLECYSTECTOMY  1991  . DILATION AND CURETTAGE OF UTERUS    . IR REMOVAL TUN ACCESS W/ PORT W/O FL MOD SED  04/30/2017  . TUBAL LIGATION      There were no vitals filed for this visit.   Subjective Assessment - 06/04/19 1103    Subjective  Patient reports  that she has been having LBP since Sept 29th when she twisted her R ankle, causing her to tweak her R knee. Fell backwards onto L shoulder and twisted her back a certain way. Knee is better, but now dealing with LBP. LBP is located in R LB with intermittent radiation down R buttock and lateral thigh, stopping at knee. Intermittent N/T in these areas as well. Hurts to use the bathroom, but denies B&B dysfunction. Worse with prolonged walking, stairs, transitions, and bending forward to pick something off the ground. Better with ice, Salonpas, and pain relief gel.    Pertinent History  thyroid disease, hx of endometrial CA treated with chemo, osteopenia, LBP HTN, HLD, GERD, BPPV, asthma    Limitations  Sitting;Lifting;Standing;Walking;House hold activities    How long can you sit comfortably?  couple minutes    How long can you stand comfortably?  couple minutes    How long can you walk comfortably?  0 minutes    Diagnostic tests  05/11/19 lumbar xray: Mild-to-moderate multilevel lumbar spine DDD, worse at L4-L5 and L5-S1. Grade 1 anterolisthesis of L4 upon L5    Patient Stated Goals  get rid of pain    Currently in Pain?  Yes    Pain Score  8     Pain Location  Back    Pain Orientation  Right    Pain Descriptors / Indicators  Aching    Pain Type  Acute pain         [facility] PT Assessment - 06/04/19 1117      Assessment   Medical Diagnosis  LBP radiating to R leg    Referring Provider (PT)  [name], MD    Onset Date/Surgical Date  04/21/19    Prior Therapy  yes- neck      Precautions   Precautions  --   osteopenia, hx CA with chemo     Balance Screen   Has the patient fallen in the past 6 months  No    Has the patient had a decrease in activity level because of a fear of falling?   No    Is the patient reluctant to leave their home because of a fear of falling?   No      Home Film/video editor residence    Living Arrangements  Spouse/significant other     Available Help at Discharge  Family    Type of [place] to enter    Entrance Stairs-Number of Steps  1    Entrance Stairs-Rails  None    Home Layout  One level      Prior Function   Level of [place]  Retired    Leisure  walking, baking, and dancing      Cognition   Overall Cognitive Status  Within Functional Limits for tasks assessed      Observation/Other Assessments   Focus on Therapeutic Outcomes (FOTO)   Lumbar: 21 (79% limited, 51% predicted)      Sensation   Light Touch  Appears Intact      Coordination   Gross Motor Movements are Fluid and Coordinated  Yes      Posture/Postural Control   Posture/Postural Control  Postural limitations    Postural Limitations  Rounded Shoulders;Forward head;Posterior pelvic tilt   rigid trunk     ROM / Strength   AROM / PROM / Strength  AROM;Strength      AROM   AROM Assessment Site  Lumbar    Lumbar Flexion  proximal shin   9/10 pain   Lumbar Extension  moderately limited   8/10 pain   Lumbar - Right Side Bend  jt line   8/10 pain   Lumbar - Left Side Bend  distal thigh   7/10 pain   Lumbar - Right Rotation  mildly limited   8/10 pain   Lumbar - Left Rotation  mildly limited   8/10 pain     Strength   Strength Assessment Site  Hip;Knee;Ankle    Right/Left Hip  Right;Left    Right Hip Flexion  4-/5   pain   Right Hip ABduction  4+/5    Right Hip ADduction  4/5    Left Hip Flexion  4+/5    Left Hip ABduction  4+/5    Left Hip ADduction  4/5    Right/Left Knee  Right;Left    Right Knee Flexion  4/5   pain   Right Knee Extension  4/5   pain   Left Knee Flexion  4/5    Left Knee Extension  4+/5    Right/Left Ankle  Right;Left  Right Ankle Dorsiflexion  4/5    Right Ankle Plantar Flexion  4/5    Left Ankle Dorsiflexion  4+/5    Left Ankle Plantar Flexion  4+/5      Palpation   Palpation comment  increased tone in B buttock with TTP in R proximal glutes,  R lumbar paraspinals, along lower lumbar spinous processes, and R QL      Ambulation/Gait   Assistive device  None    Gait Pattern  Step-to pattern;Decreased step length - left;Decreased step length - right;Decreased dorsiflexion - left;Decreased trunk rotation;Decreased arm swing - right;Decreased arm swing - left   hands on back; rigid   Ambulation Surface  Level;Indoor    Gait velocity  severely decreased                Objective measurements completed on examination: See above findings.              PT Education - 06/04/19 1151    Education Details  prognosis, POC, HEP    Person(s) Educated  Patient    Methods  Explanation;Demonstration;Tactile cues;Verbal cues;Handout    Comprehension  Verbalized understanding;Returned demonstration       PT Short Term Goals - 06/04/19 1159      PT SHORT TERM GOAL #1   Title  Patient to be independent with initial HEP.    Time  3    Period  Weeks    Status  New    Target Date  06/25/19        PT Long Term Goals - 06/04/19 1159      PT LONG TERM GOAL #1   Title  Patient to be independent with advanced HEP.    Time  6    Period  Weeks    Status  New    Target Date  07/16/19      PT LONG TERM GOAL #2   Title  Patient to demonstrate B LE strength >=4+/5.    Time  6    Period  Weeks    Status  New    Target Date  07/16/19      PT LONG TERM GOAL #3   Title  Patient to demonstrate lumbar AROM WFL and with pain <3/10.    Time  6    Period  Weeks    Status  New    Target Date  07/16/19      PT LONG TERM GOAL #4   Title  Patient to score <14 sec on TUG testing with LRAD to decrease risk of falls.    Time  6    Period  Weeks    Status  New    Target Date  07/16/19      PT LONG TERM GOAL #5   Title  Patient to report tolerance of 1 hour on her feet without pain limiting.    Time  6    Period  Weeks    Status  New    Target Date  07/16/19             Plan - 06/04/19 1152    Clinical Impression  Statement  Patient is a 68y/o F presenting to OPPT with c/o acute R sided LBP after twisting her ankle and tweaking her R knee on September 29th. Notes that all areas of pain have resolved, expect for LBP. Pain and N/T radiate down R buttock and lateral thigh, stopping at knee.  Denies B&B dysfunction. Worse with prolonged  walking, stairs, transitions, and bending forward to pick something off the ground. Patient today presenting with guarded posture, decreased R LE strength, limited and painful lumbar AROM, gait deviations, tenderness along R proximal glutes, lumbar paraspinals, QL, and along lower lumbar spinous processes. Educated patient on very gentle lumbopelvic ROM HEP. Patient reported understanding. Would benefit from skilled PT services 2x/week for 6 weeks to address aforementioned impairments.    Personal Factors and Comorbidities  Age;Comorbidity 3+;Time since onset of injury/illness/exacerbation;Fitness;Past/Current Experience    Comorbidities  thyroid disease, hx of endometrial CA treated with chemo, osteopenia, LBP HTN, HLD, GERD, BPPV, asthma    Examination-Activity Limitations  Sit;Bathing;Bed Mobility;Sleep;Bend;Squat;Stairs;Caring for Others;Carry;Stand;Toileting;Dressing;Transfers;Hygiene/Grooming;Lift;Locomotion Level;Reach Overhead    Examination-Participation Restrictions  Church;Cleaning;Shop;Community Activity;Volunteer;Driving;Yard Work;Interpersonal Relationship;Laundry;Meal Prep    Stability/Clinical Decision Making  Evolving/Moderate complexity    Clinical Decision Making  Moderate    Rehab Potential  Good    PT Frequency  2x / week    PT Duration  6 weeks    PT Treatment/Interventions  ADLs/Self Care Home Management;Cryotherapy;Electrical Stimulation;Moist Heat;Balance training;Therapeutic exercise;Therapeutic activities;Functional mobility training;Stair training;Gait training;Ultrasound;Neuromuscular re-education;Patient/family education;Orthotic Fit/Training;Manual  techniques;Vasopneumatic Device;Taping;Energy conservation;Dry needling;Passive range of motion    PT Next Visit Plan  reassess HEP; TUG; progress gentle LE stretching and lumbopelvic ROM    Consulted and Agree with Plan of Care  Patient       Patient will benefit from skilled therapeutic intervention in order to improve the following deficits and impairments:  Abnormal gait, Decreased activity tolerance, Decreased strength, Increased fascial restricitons, Pain, Increased muscle spasms, Difficulty walking, Decreased balance, Decreased range of motion, Improper body mechanics, Postural dysfunction, Impaired flexibility  Visit Diagnosis: Acute right-sided low back pain with right-sided sciatica  Difficulty in walking, not elsewhere classified  Other abnormalities of gait and mobility  Other symptoms and signs involving the musculoskeletal system     Problem List Patient Active Problem List   Diagnosis Date Noted  . Subacromial impingement of left shoulder 12/30/2018  . Moderate persistent asthma without complication [id number]  . Current use of beta blocker 06/30/2018  . Allergic rhinitis 06/30/2018  . Acute bronchitis due to other specified organisms 06/30/2018  . History of endometrial cancer 02/03/2018  . Screening Mammogram 11/05/2017  . 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  . Type 2 diabetes mellitus with diabetic neuropathy, without long-term current use of insulin ([place]) 10/25/2016  . Microcytosis 09/28/2016  . Mild intermittent asthma without complication 0000000  . Port catheter in place 11/15/2015  . Chemotherapy induced neutropenia ([place]) 11/29/2014  . Family history of breast cancer   . Family history of ovarian cancer   . Family history of prostate cancer   . Family history of colon cancer   . Morbid obesity with body mass index of 40.0-44.9 in adult [facility]) 11/09/2014  . Chemotherapy-induced peripheral neuropathy ([place]) 09/22/2014  .  Endometrial cancer ([place]) 05/20/2014  . 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  . Thyroid disease 06/01/2013  . Low back pain radiating to right leg 08/18/2012  . Anemia 11/07/2011  . Hyperlipidemia 06/15/2011  . Diabetes mellitus type 2 in obese ([place]) 03/12/2011  . Acute pain of right knee 01/27/2011  . Obstructive sleep apnea 11/15/2010  . Osteoarthritis 11/15/2010  . Gastroesophageal reflux disease without esophagitis 09/14/2010  . DIVERTICULITIS, HX OF 09/14/2010  . POSTMENOPAUSAL STATUS 10/01/2008  . COLONIC POLYPS, HX OF 03/10/2008  . Essential hypertension 08/13/2006     Janene Harvey, PT, DPT 06/04/19 12:02 PM   [place] [id number]  [address]  [place], [place], [address] Phone: [phone]   Fax:  [phone]  Name: Katelyn Fisher MRN: [id number] Date of Birth: 11-24-1950

## 2019-06-08 ENCOUNTER — Encounter: Payer: Self-pay | Admitting: Family Medicine

## 2019-06-08 ENCOUNTER — Other Ambulatory Visit: Payer: Self-pay

## 2019-06-08 ENCOUNTER — Ambulatory Visit (INDEPENDENT_AMBULATORY_CARE_PROVIDER_SITE_OTHER): Payer: Medicare PPO | Admitting: Family Medicine

## 2019-06-08 ENCOUNTER — Other Ambulatory Visit: Payer: Self-pay | Admitting: Family Medicine

## 2019-06-08 DIAGNOSIS — E669 Obesity, unspecified: Secondary | ICD-10-CM

## 2019-06-08 DIAGNOSIS — M545 Low back pain: Secondary | ICD-10-CM

## 2019-06-08 DIAGNOSIS — M79604 Pain in right leg: Secondary | ICD-10-CM

## 2019-06-08 DIAGNOSIS — M7542 Impingement syndrome of left shoulder: Secondary | ICD-10-CM | POA: Diagnosis not present

## 2019-06-08 DIAGNOSIS — E1169 Type 2 diabetes mellitus with other specified complication: Secondary | ICD-10-CM

## 2019-06-08 NOTE — Patient Instructions (Signed)
Good to see you Please use heat on the lower back  Please try physical therapy  Please try the pennsaid   Please send me a message in MyChart with any questions or updates.  Please see me back in 2 months.   --Dr. Raeford Razor

## 2019-06-08 NOTE — Assessment & Plan Note (Signed)
Currently stable. Has started PT and pain is improved.  Most of the pain is localized to the lower back. Less radicular in nature.   - continue PT  - counseled on HEP and supportive care - if no improvement consider MRI.

## 2019-06-08 NOTE — Progress Notes (Signed)
Katelyn Fisher - 68 y.o. female MRN [id number]  Date of birth: Feb 26, 1951  SUBJECTIVE:  Including CC & ROS.  Chief Complaint  Patient presents with  . Follow-up    follow up for low back and left shoulder    Katelyn Fisher is a 68 y.o. female that is following up for her low back pain and left shoulder pain.  She has started physical therapy and had 1 session.  Her pain is intermittent and controllable.  It is mild to moderate.  Is intermittent in nature.  She has intermittent sciatica from time to time.  She denies any intense pain.  Left shoulder is doing better.  She received an injection.  She also uses the Pennsaid from time to time.  Denies any radicular symptoms down the arm.    Review of Systems  Constitutional: Negative for fever.  HENT: Negative for congestion.   Respiratory: Negative for cough.   Cardiovascular: Negative for chest pain.  Gastrointestinal: Negative for abdominal pain.  Musculoskeletal: Positive for back pain.  Skin: Negative for color change.  Neurological: Negative for weakness.  Hematological: Negative for adenopathy.    HISTORY: Past Medical, Surgical, Social, and Family History Reviewed & Updated per EMR.   Pertinent Historical Findings include:  Past Medical History:  Diagnosis Date  . Anxiety   . Arthritis    back  . Asthma   . Benign paroxysmal positional vertigo 11/01/2012  . Cancer ([place]) 12/22/2014   endometrial  . Depression   . Endometrial cancer ([place]) 2015  . Family history of breast cancer   . Family history of colon cancer   . Family history of ovarian cancer   . Family history of prostate cancer   . GERD (gastroesophageal reflux disease)   . History of colonic diverticulitis   . Hx of colonic polyps   . Hyperlipidemia   . Hypertension   . 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  . Low back pain radiating to right leg 08/18/2012  . Obesity   . Osteopenia 09/2017   T score -1.2 FRAX 3.2% / 0.3%  . Personal history of chemotherapy  2015  . Radiation 12/17/14-01/03/15   vaginal brachytherapy  . Thyroid disease 06/01/2013    Past Surgical History:  Procedure Laterality Date  . ABDOMINAL HYSTERECTOMY  06/23/14   [facility], TRH/BSO  . CHOLECYSTECTOMY  1991  . DILATION AND CURETTAGE OF UTERUS    . IR REMOVAL TUN ACCESS W/ PORT W/O FL MOD SED  04/30/2017  . TUBAL LIGATION      Allergies  Allergen Reactions  . Shellfish Allergy Hives, Shortness Of Breath and Swelling  . Oxycodone Swelling    Facial swelling and tightness  . Penicillins Swelling    Rash with welps  . Iodinated Diagnostic Agents Rash  . Sulfa Antibiotics Rash    Family History  Problem Relation Age of Onset  . Heart disease Maternal Aunt        x 3 -2 brothers  . Hypertension Maternal Aunt   . Breast cancer Maternal Aunt        maternal half; dx in her 94s  . Cancer Maternal Aunt        cervical  . Ovarian cancer Maternal Aunt        dx in her 6s  . Prostate cancer Father 32  . Diabetes Father   . Cancer Father        lung cancer, asbestos exposure  . Heart disease Brother   . Diabetes Brother   .  Diabetes Mother   . Lupus Mother   . Heart disease Brother   . Hyperlipidemia Sister   . Heart disease Brother   . Diabetes Brother   . Colon polyps Brother   . Heart disease Brother   . Diabetes Brother   . Colon polyps Brother   . Colon cancer Maternal Grandmother        dx in her 77s  . Leukemia Maternal Uncle 21  . Cancer Paternal Aunt        NOS- breast   . Prostate cancer Paternal Uncle        dx in his 73s  . Breast cancer Other        dx in her 36s     Social History   Socioeconomic History  . Marital status: Married    Spouse name: Not on file  . Number of children: 3  . Years of education: Not on file  . Highest education level: Not on file  Occupational History  . Occupation: retired Transport planner  . Financial resource strain: Not on file  . Food insecurity    Worry: Not on file    Inability: Not on  file  . Transportation needs    Medical: Not on file    Non-medical: Not on file  Tobacco Use  . Smoking status: Never Smoker  . Smokeless tobacco: Never Used  Substance and Sexual Activity  . Alcohol use: No    Alcohol/week: 0.0 standard drinks  . Drug use: No  . Sexual activity: Yes    Partners: Male    Comment: 1st intercourse- 59, partners- 16, married- 16 yrs   Lifestyle  . Physical activity    Days per week: Not on file    Minutes per session: Not on file  . Stress: Not on file  Relationships  . Social Herbalist on phone: Not on file    Gets together: Not on file    Attends religious service: Not on file    Active member of club or organization: Not on file    Attends meetings of clubs or organizations: Not on file    Relationship status: Not on file  . Intimate partner violence    Fear of current or ex partner: Not on file    Emotionally abused: Not on file    Physically abused: Not on file    Forced sexual activity: Not on file  Other Topics Concern  . Not on file  Social History Narrative   Married- 36 years   Never Smoked   Alcohol use-no   Drug use-no   Occupation: housewife   Caffeine use/day:  None   Does Patient Exercise:  yes     PHYSICAL EXAM:  VS: BP 122/78   Pulse 84   Ht 5\' 3"  (1.6 m)   Wt 213 lb (96.6 kg)   BMI 37.73 kg/m  Physical Exam Gen: NAD, alert, cooperative with exam, well-appearing ENT: normal lips, normal nasal mucosa,  Eye: normal EOM, normal conjunctiva and lids CV:  no edema, +2 pedal pulses   Resp: no accessory muscle use, non-labored,  Skin: no rashes, no areas of induration  Neuro: normal tone, normal sensation to touch Psych:  normal insight, alert and oriented MSK:  Left shoulder:  Normal active flexion and abduction  Normal IR and ER  Normal empty can test  Back:  Limited flexion  Normal strength to resistance with hip flexion  Negative SLR  NVI      ASSESSMENT & PLAN:   Subacromial  impingement of left shoulder Has done well since the injection.  - counseled on HEP and supportive care - pennsaid samples  - consider PT   Low back pain radiating to right leg Currently stable. Has started PT and pain is improved.  Most of the pain is localized to the lower back. Less radicular in nature.   - continue PT  - counseled on HEP and supportive care - if no improvement consider MRI.

## 2019-06-08 NOTE — Assessment & Plan Note (Signed)
Has done well since the injection.  - counseled on HEP and supportive care - pennsaid samples  - consider PT

## 2019-06-08 NOTE — Progress Notes (Signed)
Medication Samples have been provided to the patient.  Drug name: Pennsaid       Strength: 2%        Qty: 2 Boxes  LOTFU:[id number]  Exp.Date: 02/2020  Dosing instructions: Use a peasize amount and rub gently.  The patient has been instructed regarding the correct time, dose, and frequency of taking this medication, including desired effects and most common side effects.   Sherrie George, MA 9:13 AM 06/08/2019

## 2019-06-09 ENCOUNTER — Ambulatory Visit: Payer: Medicare PPO | Admitting: Physical Therapy

## 2019-06-09 DIAGNOSIS — R29898 Other symptoms and signs involving the musculoskeletal system: Secondary | ICD-10-CM | POA: Diagnosis not present

## 2019-06-09 DIAGNOSIS — R2689 Other abnormalities of gait and mobility: Secondary | ICD-10-CM

## 2019-06-09 DIAGNOSIS — R262 Difficulty in walking, not elsewhere classified: Secondary | ICD-10-CM | POA: Diagnosis not present

## 2019-06-09 DIAGNOSIS — M5441 Lumbago with sciatica, right side: Secondary | ICD-10-CM

## 2019-06-09 NOTE — Therapy (Signed)
[facility] [address]  [place], [place], [address] Phone: [phone]   Fax:  [phone]  Physical Therapy Treatment  Patient Details  Name: Katelyn Fisher MRN: [id number] Date of Birth: [birth year] Referring Provider (PT): [name], MD   Encounter Date: 06/09/2019  PT End of Session - 06/09/19 0902    Visit Number  2    Number of Visits  13    Date for PT Re-Evaluation  07/16/19    Authorization Type  [employer]    PT Start Time  [phone]    PT Stop Time  0930    PT Time Calculation (min)  46 min    Activity Tolerance  Patient tolerated treatment well;Patient limited by pain    Behavior During Therapy  [facility] for tasks assessed/performed       Past Medical History:  Diagnosis Date  . Anxiety   . Arthritis    back  . Asthma   . Benign paroxysmal positional vertigo 11/01/2012  . Cancer ([place]) 12/22/2014   endometrial  . Depression   . Endometrial cancer ([place]) 2015  . Family history of breast cancer   . Family history of colon cancer   . Family history of ovarian cancer   . Family history of prostate cancer   . GERD (gastroesophageal reflux disease)   . History of colonic diverticulitis   . Hx of colonic polyps   . Hyperlipidemia   . Hypertension   . 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  . Low back pain radiating to right leg 08/18/2012  . Obesity   . Osteopenia 09/2017   T score -1.2 FRAX 3.2% / 0.3%  . Personal history of chemotherapy 2015  . Radiation 12/17/14-01/03/15   vaginal brachytherapy  . Thyroid disease 06/01/2013    Past Surgical History:  Procedure Laterality Date  . ABDOMINAL HYSTERECTOMY  06/23/14   [facility], TRH/BSO  . CHOLECYSTECTOMY  1991  . DILATION AND CURETTAGE OF UTERUS    . IR REMOVAL TUN ACCESS W/ PORT W/O FL MOD SED  04/30/2017  . TUBAL LIGATION      There were no vitals filed for this visit.  Subjective Assessment - 06/09/19 0900    Subjective  Pt arriving to  therapy today reporting 7/10 Low back pain. Pt reporting last night while cooking her pain got worse while stirring her cornbread. Pt also reporting incrased pain when bending into her lower cabinets.    Pertinent History  thyroid disease, hx of endometrial CA treated with chemo, osteopenia, LBP HTN, HLD, GERD, BPPV, asthma    Limitations  Sitting;Lifting;Standing;Walking;House hold activities    How long can you sit comfortably?  couple minutes    How long can you stand comfortably?  couple minutes    How long can you walk comfortably?  0 minutes    Diagnostic tests  05/11/19 lumbar xray: Mild-to-moderate multilevel lumbar spine DDD, worse at L4-L5 and L5-S1. Grade 1 anterolisthesis of L4 upon L5    Currently in Pain?  Yes    Pain Score  7     Pain Location  Back    Pain Orientation  Right;Lower    Pain Descriptors / Indicators  Aching    Pain Type  Acute pain         [facility] PT Assessment - 06/09/19 0001      Assessment   Medical Diagnosis  LBP radiating to R leg    Referring Provider (PT)  Ysidro Evert  Raeford Razor, MD    Onset Date/Surgical Date  04/21/19    Prior Therapy  yes- neck      Observation/Other Assessments   Focus on Therapeutic Outcomes (FOTO)   Lumbar: 21 (79% limited, 51% predicted)      Balance   Balance Assessed  Yes      High Level Balance   High Level Balance Activites  Other (comment)    High Level Balance Comments  TUG: 18.38 seconds                   [facility] Adult PT Treatment/Exercise - 06/09/19 0001      Exercises   Exercises  Lumbar      Lumbar Exercises: Stretches   Passive Hamstring Stretch  Right;Left;2 reps;30 seconds;Limitations    Passive Hamstring Stretch Limitations  using strap    Single Knee to Chest Stretch  Right;Left;2 reps;10 seconds      Lumbar Exercises: Seated   Long Arc Quad on Chair  AROM;Strengthening;Both;10 reps    Other Seated Lumbar Exercises  reaching over large red ball with trunk flexion x 10 reps holding 10 seocnds  each, seated holding blue/green ball with arms at 90 degrees flexion holding wall while rotating trunk to each side x 5 reps to each side.       Lumbar Exercises: Supine   Other Supine Lumbar Exercises  supine marching: x 20 reps, clams shells x 10 reps, ball squeezes x 15 reps holding 5 seocnds each      Modalities   Modalities  Moist Heat      Moist Heat Therapy   Number Minutes Moist Heat  10 Minutes    Moist Heat Location  Lumbar Spine      Manual Therapy   Manual Therapy  Soft tissue mobilization    Manual therapy comments  10 minutes    Soft tissue mobilization  IASTM: lumbar paraspinals and R gluteals               PT Short Term Goals - 06/09/19 0909      PT SHORT TERM GOAL #1   Title  Patient to be independent with initial HEP.    Time  3    Period  Weeks    Target Date  06/25/19        PT Long Term Goals - 06/09/19 0910      PT LONG TERM GOAL #1   Title  Patient to be independent with advanced HEP.    Time  6    Period  Weeks    Status  On-going      PT LONG TERM GOAL #2   Title  Patient to demonstrate B LE strength >=4+/5.    Time  6    Period  Weeks    Status  New      PT LONG TERM GOAL #3   Title  Patient to demonstrate lumbar AROM WFL and with pain <3/10.    Time  6    Period  Weeks    Status  New      PT LONG TERM GOAL #4   Title  Patient to score <14 sec on TUG testing with LRAD to decrease risk of falls.    Period  Weeks    Status  New      PT LONG TERM GOAL #5   Title  Patient to report tolerance of 1 hour on her feet without pain limiting.    Time  6  Period  Weeks    Status  New            Plan - 06/09/19 0903    Clinical Impression Statement  Pt arriving today reporting 7/10 LBP more on the right side. Pt reporting periods of radiation down her R LE, but not today. TUG: 18.38 seconds. Pt tolerating sitting and supine exercises well. Pt reporting less pain following IASTM to lumbar paraspinals with pt in sidelying  position. Moist heat applied at end of treatment. Pt reporting less pain at end of session. Continue with skilled PT.    Personal Factors and Comorbidities  Age;Comorbidity 3+;Time since onset of injury/illness/exacerbation;Fitness;Past/Current Experience    Comorbidities  thyroid disease, hx of endometrial CA treated with chemo, osteopenia, LBP HTN, HLD, GERD, BPPV, asthma    Examination-Activity Limitations  Sit;Bathing;Bed Mobility;Sleep;Bend;Squat;Stairs;Caring for Others;Carry;Stand;Toileting;Dressing;Transfers;Hygiene/Grooming;Lift;Locomotion Level;Reach Overhead    Examination-Participation Restrictions  Church;Cleaning;Shop;Community Activity;Volunteer;Driving;Yard Work;Interpersonal Relationship;Laundry;Meal Prep    Stability/Clinical Decision Making  Evolving/Moderate complexity    Rehab Potential  Good    PT Frequency  2x / week    PT Duration  6 weeks    PT Treatment/Interventions  ADLs/Self Care Home Management;Cryotherapy;Electrical Stimulation;Moist Heat;Balance training;Therapeutic exercise;Therapeutic activities;Functional mobility training;Stair training;Gait training;Ultrasound;Neuromuscular re-education;Patient/family education;Orthotic Fit/Training;Manual techniques;Vasopneumatic Device;Taping;Energy conservation;Dry needling;Passive range of motion    PT Next Visit Plan  reassess HEP; TUG; progress gentle LE stretching and lumbopelvic ROM    Consulted and Agree with Plan of Care  Patient       Patient will benefit from skilled therapeutic intervention in order to improve the following deficits and impairments:  Abnormal gait, Decreased activity tolerance, Decreased strength, Increased fascial restricitons, Pain, Increased muscle spasms, Difficulty walking, Decreased balance, Decreased range of motion, Improper body mechanics, Postural dysfunction, Impaired flexibility  Visit Diagnosis: Acute right-sided low back pain with right-sided sciatica  Difficulty in walking, not  elsewhere classified  Other abnormalities of gait and mobility  Other symptoms and signs involving the musculoskeletal system     Problem List Patient Active Problem List   Diagnosis Date Noted  . Subacromial impingement of left shoulder 12/30/2018  . Moderate persistent asthma without complication [id number]  . Current use of beta blocker 06/30/2018  . Allergic rhinitis 06/30/2018  . Acute bronchitis due to other specified organisms 06/30/2018  . History of endometrial cancer 02/03/2018  . Screening Mammogram 11/05/2017  . 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  . Type 2 diabetes mellitus with diabetic neuropathy, without long-term current use of insulin ([place]) 10/25/2016  . Microcytosis 09/28/2016  . Mild intermittent asthma without complication 0000000  . Port catheter in place 11/15/2015  . Chemotherapy induced neutropenia ([place]) 11/29/2014  . Family history of breast cancer   . Family history of ovarian cancer   . Family history of prostate cancer   . Family history of colon cancer   . Morbid obesity with body mass index of 40.0-44.9 in adult [facility]) 11/09/2014  . 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 Endometrial cancer ([place]) 05/20/2014  . 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  . Thyroid disease 06/01/2013  . Low back pain radiating to right leg 08/18/2012  . Anemia 11/07/2011  . Hyperlipidemia 06/15/2011  . Diabetes mellitus type 2 in obese ([place]) 03/12/2011  . Acute pain of right knee 01/27/2011  . Obstructive sleep apnea 11/15/2010  . Osteoarthritis 11/15/2010  . Gastroesophageal reflux disease without esophagitis 09/14/2010  . DIVERTICULITIS, HX OF 09/14/2010  . POSTMENOPAUSAL STATUS 10/01/2008  . COLONIC POLYPS, HX OF 03/10/2008  . Essential hypertension 08/13/2006  Oretha Caprice, PT 06/09/2019, 11:22 AM  [facility] [address]  [place], [place],  [address] Phone: [phone]   Fax:  [phone]  Name: Katelyn Fisher MRN: [id number] Date of Birth: [birth year]

## 2019-06-11 ENCOUNTER — Ambulatory Visit: Payer: Medicare PPO | Admitting: Physical Therapy

## 2019-06-11 ENCOUNTER — Other Ambulatory Visit: Payer: Self-pay

## 2019-06-11 ENCOUNTER — Encounter: Payer: Self-pay | Admitting: Physical Therapy

## 2019-06-11 DIAGNOSIS — R2689 Other abnormalities of gait and mobility: Secondary | ICD-10-CM

## 2019-06-11 DIAGNOSIS — R29898 Other symptoms and signs involving the musculoskeletal system: Secondary | ICD-10-CM | POA: Diagnosis not present

## 2019-06-11 DIAGNOSIS — M5441 Lumbago with sciatica, right side: Secondary | ICD-10-CM

## 2019-06-11 DIAGNOSIS — R262 Difficulty in walking, not elsewhere classified: Secondary | ICD-10-CM | POA: Diagnosis not present

## 2019-06-11 NOTE — Therapy (Signed)
[facility] [address]  [place], [place], [address] Phone: [phone]   Fax:  [phone]  Physical Therapy Treatment  Patient Details  Name: Katelyn Fisher MRN: [id number] Date of Birth: 03/02/1951 Referring Provider (PT): [name], MD   Encounter Date: 06/11/2019  PT End of Session - 06/11/19 1059    Visit Number  3    Number of Visits  13    Date for PT Re-Evaluation  07/16/19    Authorization Type  [employer] [facility]    PT Start Time  1015    PT Stop Time  1106   moist heat   PT Time Calculation (min)  51 min    Activity Tolerance  Patient tolerated treatment well;Patient limited by pain    Behavior During Therapy  [facility] for tasks assessed/performed       Past Medical History:  Diagnosis Date  . Anxiety   . Arthritis    back  . Asthma   . Benign paroxysmal positional vertigo 11/01/2012  . Cancer ([place]) 12/22/2014   endometrial  . Depression   . Endometrial cancer ([place]) 2015  . Family history of breast cancer   . Family history of colon cancer   . Family history of ovarian cancer   . Family history of prostate cancer   . GERD (gastroesophageal reflux disease)   . History of colonic diverticulitis   . Hx of colonic polyps   . Hyperlipidemia   . Hypertension   . 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  . Low back pain radiating to right leg 08/18/2012  . Obesity   . Osteopenia 09/2017   T score -1.2 FRAX 3.2% / 0.3%  . Personal history of chemotherapy 2015  . Radiation 12/17/14-01/03/15   vaginal brachytherapy  . Thyroid disease 06/01/2013    Past Surgical History:  Procedure Laterality Date  . ABDOMINAL HYSTERECTOMY  06/23/14   [facility], TRH/BSO  . CHOLECYSTECTOMY  1991  . DILATION AND CURETTAGE OF UTERUS    . IR REMOVAL TUN ACCESS W/ PORT W/O FL MOD SED  04/30/2017  . TUBAL LIGATION      There were no vitals filed for this visit.  Subjective Assessment - 06/11/19 1016    Subjective   Reports that she felt good after massage last session.    Pertinent History  thyroid disease, hx of endometrial CA treated with chemo, osteopenia, LBP HTN, HLD, GERD, BPPV, asthma    Diagnostic tests  05/11/19 lumbar xray: Mild-to-moderate multilevel lumbar spine DDD, worse at L4-L5 and L5-S1. Grade 1 anterolisthesis of L4 upon L5    Patient Stated Goals  get rid of pain    Currently in Pain?  Yes    Pain Score  5     Pain Location  Back    Pain Orientation  Right;Lower    Pain Descriptors / Indicators  Aching    Pain Type  Acute pain                       [facility] Adult PT Treatment/Exercise - 06/11/19 0001      Self-Care   Self-Care  Other Self-Care Comments    Other Self-Care Comments   edu, demonstration, and practice of self-STM to R glutes and piriformis      Lumbar Exercises: Stretches   Passive Hamstring Stretch  Right;1 rep;30 seconds    Passive Hamstring Stretch Limitations  supine with strap    Single Knee  to Chest Stretch  Right;1 rep;30 seconds    Single Knee to Chest Stretch Limitations  with strap    Figure 4 Stretch  30 seconds;With overpressure;2 reps    Figure 4 Stretch Limitations  assistance to hold position      Lumbar Exercises: Aerobic   Nustep  L3 x 6 min (UEs/LEs)      Lumbar Exercises: Supine   Bridge  10 reps    Bridge Limitations  cues to perform within limited ROM d/t LBP    Other Supine Lumbar Exercises  LTR x20 to tolerance      Lumbar Exercises: Sidelying   Other Sidelying Lumbar Exercises  open book stretch x10 each side to tol   manual cues to encourage increased lumbar rotation     Moist Heat Therapy   Number Minutes Moist Heat  10 Minutes    Moist Heat Location  Lumbar Spine      Manual Therapy   Manual Therapy  Soft tissue mobilization;Myofascial release    Manual therapy comments  L sidelying    Soft tissue mobilization  STM to R proximal glutes, piriformis, lumbar paraspinals, and QL    Myofascial Release  manual TPR to  R proximal glutes and piriformis- tender trigger pts evident             PT Education - 06/11/19 1059    Education Details  update to [employer]) Educated  Patient    Methods  Explanation;Demonstration;Tactile cues;Verbal cues;Handout    Comprehension  Verbalized understanding;Returned demonstration       PT Short Term Goals - 06/09/19 0909      PT SHORT TERM GOAL #1   Title  Patient to be independent with initial HEP.    Time  3    Period  Weeks    Target Date  06/25/19        PT Long Term Goals - 06/09/19 0910      PT LONG TERM GOAL #1   Title  Patient to be independent with advanced HEP.    Time  6    Period  Weeks    Status  On-going      PT LONG TERM GOAL #2   Title  Patient to demonstrate B LE strength >=4+/5.    Time  6    Period  Weeks    Status  New      PT LONG TERM GOAL #3   Title  Patient to demonstrate lumbar AROM WFL and with pain <3/10.    Time  6    Period  Weeks    Status  New      PT LONG TERM GOAL #4   Title  Patient to score <14 sec on TUG testing with LRAD to decrease risk of falls.    Period  Weeks    Status  New      PT LONG TERM GOAL #5   Title  Patient to report tolerance of 1 hour on her feet without pain limiting.    Time  6    Period  Weeks    Status  New            Plan - 06/11/19 1059    Clinical Impression Statement  Patient reporting improvement in pain levels after massage last session and demonstrating observable improvement in self-selected gait speed. Began today's session with manual therapy d/t patient's positive response from last session. Patient reported relief with STM and manual TPR  to R proximal glutes and piriformis. Thus, educated patient on self-STM to these areas for pain management at home. Patient reported understanding. Did c/o LBP with bridges, which were better tolerated with decreased ROM. Initiated hip ER stretching with good tolerance after instruction, thus updated HEP with these  exercises. Patient reported understanding. Ended session with moist heat with LB per patient's request. No complaints at end of session.    Comorbidities  thyroid disease, hx of endometrial CA treated with chemo, osteopenia, LBP HTN, HLD, GERD, BPPV, asthma    Examination-Activity Limitations  Sit;Bathing;Bed Mobility;Sleep;Bend;Squat;Stairs;Caring for Others;Carry;Stand;Toileting;Dressing;Transfers;Hygiene/Grooming;Lift;Locomotion Level;Reach Overhead    Examination-Participation Restrictions  Church;Cleaning;Shop;Community Activity;Volunteer;Driving;Yard Work;Interpersonal Relationship;Laundry;Meal Prep    Stability/Clinical Decision Making  Evolving/Moderate complexity    Rehab Potential  Good    PT Frequency  2x / week    PT Duration  6 weeks    PT Treatment/Interventions  ADLs/Self Care Home Management;Cryotherapy;Electrical Stimulation;Moist Heat;Balance training;Therapeutic exercise;Therapeutic activities;Functional mobility training;Stair training;Gait training;Ultrasound;Neuromuscular re-education;Patient/family education;Orthotic Fit/Training;Manual techniques;Vasopneumatic Device;Taping;Energy conservation;Dry needling;Passive range of motion    PT Next Visit Plan  progress gentle LE stretching and lumbopelvic ROM    Consulted and Agree with Plan of Care  Patient       Patient will benefit from skilled therapeutic intervention in order to improve the following deficits and impairments:  Abnormal gait, Decreased activity tolerance, Decreased strength, Increased fascial restricitons, Pain, Increased muscle spasms, Difficulty walking, Decreased balance, Decreased range of motion, Improper body mechanics, Postural dysfunction, Impaired flexibility  Visit Diagnosis: Acute right-sided low back pain with right-sided sciatica  Difficulty in walking, not elsewhere classified  Other abnormalities of gait and mobility  Other symptoms and signs involving the musculoskeletal  system     Problem List Patient Active Problem List   Diagnosis Date Noted  . Subacromial impingement of left shoulder 12/30/2018  . Moderate persistent asthma without complication [id number]  . Current use of beta blocker 06/30/2018  . Allergic rhinitis 06/30/2018  . Acute bronchitis due to other specified organisms 06/30/2018  . History of endometrial cancer 02/03/2018  . Screening Mammogram 11/05/2017  . 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  . Type 2 diabetes mellitus with diabetic neuropathy, without long-term current use of insulin ([place]) 10/25/2016  . Microcytosis 09/28/2016  . Mild intermittent asthma without complication 0000000  . Port catheter in place 11/15/2015  . Chemotherapy induced neutropenia ([place]) 11/29/2014  . Family history of breast cancer   . Family history of ovarian cancer   . Family history of prostate cancer   . Family history of colon cancer   . Morbid obesity with body mass index of 40.0-44.9 in adult [facility]) 11/09/2014  . Chemotherapy-induced peripheral neuropathy ([place]) 09/22/2014  . Endometrial cancer ([place]) 05/20/2014  . 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  . Thyroid disease 06/01/2013  . Low back pain radiating to right leg 08/18/2012  . Anemia 11/07/2011  . Hyperlipidemia 06/15/2011  . Diabetes mellitus type 2 in obese ([place]) 03/12/2011  . Acute pain of right knee 01/27/2011  . Obstructive sleep apnea 11/15/2010  . Osteoarthritis 11/15/2010  . Gastroesophageal reflux disease without esophagitis 09/14/2010  . DIVERTICULITIS, HX OF 09/14/2010  . POSTMENOPAUSAL STATUS 10/01/2008  . COLONIC POLYPS, HX OF 03/10/2008  . Essential hypertension 08/13/2006     Janene Harvey, PT, DPT 06/11/19 11:07 AM   [facility] [address]  [place], [place], [address] Phone: [phone]   Fax:  [phone]  Name: Katelyn Fisher MRN: [id number] Date of  Birth:  11-11-1950

## 2019-06-16 ENCOUNTER — Ambulatory Visit: Payer: Medicare PPO | Admitting: Physical Therapy

## 2019-06-16 ENCOUNTER — Other Ambulatory Visit: Payer: Self-pay

## 2019-06-16 ENCOUNTER — Encounter: Payer: Self-pay | Admitting: Physical Therapy

## 2019-06-16 DIAGNOSIS — R2689 Other abnormalities of gait and mobility: Secondary | ICD-10-CM | POA: Diagnosis not present

## 2019-06-16 DIAGNOSIS — R262 Difficulty in walking, not elsewhere classified: Secondary | ICD-10-CM

## 2019-06-16 DIAGNOSIS — R29898 Other symptoms and signs involving the musculoskeletal system: Secondary | ICD-10-CM | POA: Diagnosis not present

## 2019-06-16 DIAGNOSIS — M5441 Lumbago with sciatica, right side: Secondary | ICD-10-CM

## 2019-06-16 NOTE — Therapy (Signed)
[facility] [address]  [place], [place], [address] Phone: [phone]   Fax:  [phone]  Physical Therapy Treatment  Patient Details  Name: Katelyn Fisher MRN: [id number] Date of Birth: 1951/07/14 Referring Provider (PT): [name], MD   Encounter Date: 06/16/2019  PT End of Session - 06/16/19 1011    Visit Number  4    Number of Visits  13    Date for PT Re-Evaluation  07/16/19    Authorization Type  [employer]    PT Start Time  [phone]    PT Stop Time  1023    PT Time Calculation (min)  54 min    Activity Tolerance  Patient tolerated treatment well    Behavior During Therapy  [facility] for tasks assessed/performed       Past Medical History:  Diagnosis Date  . Anxiety   . Arthritis    back  . Asthma   . Benign paroxysmal positional vertigo 11/01/2012  . Cancer ([place]) 12/22/2014   endometrial  . Depression   . Endometrial cancer ([place]) 2015  . Family history of breast cancer   . Family history of colon cancer   . Family history of ovarian cancer   . Family history of prostate cancer   . GERD (gastroesophageal reflux disease)   . History of colonic diverticulitis   . Hx of colonic polyps   . Hyperlipidemia   . Hypertension   . 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  . Low back pain radiating to right leg 08/18/2012  . Obesity   . Osteopenia 09/2017   T score -1.2 FRAX 3.2% / 0.3%  . Personal history of chemotherapy 2015  . Radiation 12/17/14-01/03/15   vaginal brachytherapy  . Thyroid disease 06/01/2013    Past Surgical History:  Procedure Laterality Date  . ABDOMINAL HYSTERECTOMY  06/23/14   [facility], TRH/BSO  . CHOLECYSTECTOMY  1991  . DILATION AND CURETTAGE OF UTERUS    . IR REMOVAL TUN ACCESS W/ PORT W/O FL MOD SED  04/30/2017  . TUBAL LIGATION      There were no vitals filed for this visit.  Subjective Assessment - 06/16/19 0931    Subjective  Had a good relaxing weekend. Back was  bothering her- took a trip to [place] on Saturday. Using the ball on her back helps a lot.    Pertinent History  thyroid disease, hx of endometrial CA treated with chemo, osteopenia, LBP HTN, HLD, GERD, BPPV, asthma    Diagnostic tests  05/11/19 lumbar xray: Mild-to-moderate multilevel lumbar spine DDD, worse at L4-L5 and L5-S1. Grade 1 anterolisthesis of L4 upon L5    Patient Stated Goals  get rid of pain    Currently in Pain?  Yes    Pain Score  5     Pain Location  Back    Pain Orientation  Right;Lower    Pain Descriptors / Indicators  Aching    Pain Type  Acute pain                       [facility] Adult PT Treatment/Exercise - 06/16/19 0001      Lumbar Exercises: Stretches   Single Knee to Chest Stretch  Right;Left;1 rep;30 seconds    Single Knee to Chest Stretch Limitations  with strap    Figure 4 Stretch  1 rep;30 seconds;With overpressure    Figure 4 Stretch Limitations  supine  Lumbar Exercises: Aerobic   Nustep  L3 x 6 min (UEs/LEs)      Lumbar Exercises: Standing   Other Standing Lumbar Exercises  R/L QL stretch in doorway x30" to tolerance   guidance for form     Lumbar Exercises: Seated   Sit to Stand  10 reps    Sit to Stand Limitations  5x STS; 5x touching bottom on foam pad   cues for proper form   Other Seated Lumbar Exercises  lumbar flexion pball rollouts 10x5" ; 5x5" to R and L to tolerance      Lumbar Exercises: Sidelying   Clam  Right;Left;10 reps   2nd set with yellow TB above knees   Clam Limitations  good form      Modalities   Modalities  Moist Heat;Electrical Stimulation      Moist Heat Therapy   Number Minutes Moist Heat  15 Minutes    Moist Heat Location  Lumbar Spine   R     Electrical Stimulation   Electrical Stimulation Location  B lumbar paraspinals    Electrical Stimulation Action  IFC    Electrical Stimulation Parameters  80-150 hz; output 14 to tol; 15 min    Electrical Stimulation Goals  Pain                PT Short Term Goals - 06/09/19 0909      PT SHORT TERM GOAL #1   Title  Patient to be independent with initial HEP.    Time  3    Period  Weeks    Target Date  06/25/19        PT Long Term Goals - 06/09/19 0910      PT LONG TERM GOAL #1   Title  Patient to be independent with advanced HEP.    Time  6    Period  Weeks    Status  On-going      PT LONG TERM GOAL #2   Title  Patient to demonstrate B LE strength >=4+/5.    Time  6    Period  Weeks    Status  New      PT LONG TERM GOAL #3   Title  Patient to demonstrate lumbar AROM WFL and with pain <3/10.    Time  6    Period  Weeks    Status  New      PT LONG TERM GOAL #4   Title  Patient to score <14 sec on TUG testing with LRAD to decrease risk of falls.    Period  Weeks    Status  New      PT LONG TERM GOAL #5   Title  Patient to report tolerance of 1 hour on her feet without pain limiting.    Time  6    Period  Weeks    Status  New            Plan - 06/16/19 1011    Clinical Impression Statement  Patient reporting having LBP yesterday after taking a trip to [place] on Saturday.  Bending forward to pick things up from the floor and stirring a pot is still difficult for her. Noting that the self-massage with the ball is helping her a lot. Began session with gentle lumbopelvic ROM with good tolerance but visible limit in ROM. Able to demonstrate better tolerance for hip stretching today. Introduced  clamshells for hip strengthening with patient demonstrating good form. Also able to  tolerate increase in light banded resistance. Patient still reporting 5/10 pain in LB after ther-ex, thus ended session with e-stim and moist heat to LB. Patient without complaints at end of session.    Comorbidities  thyroid disease, hx of endometrial CA treated with chemo, osteopenia, LBP HTN, HLD, GERD, BPPV, asthma    Examination-Activity Limitations  Sit;Bathing;Bed Mobility;Sleep;Bend;Squat;Stairs;Caring for  Others;Carry;Stand;Toileting;Dressing;Transfers;Hygiene/Grooming;Lift;Locomotion Level;Reach Overhead    Examination-Participation Restrictions  Church;Cleaning;Shop;Community Activity;Volunteer;Driving;Yard Work;Interpersonal Relationship;Laundry;Meal Prep    Stability/Clinical Decision Making  Evolving/Moderate complexity    Rehab Potential  Good    PT Frequency  2x / week    PT Duration  6 weeks    PT Treatment/Interventions  ADLs/Self Care Home Management;Cryotherapy;Electrical Stimulation;Moist Heat;Balance training;Therapeutic exercise;Therapeutic activities;Functional mobility training;Stair training;Gait training;Ultrasound;Neuromuscular re-education;Patient/family education;Orthotic Fit/Training;Manual techniques;Vasopneumatic Device;Taping;Energy conservation;Dry needling;Passive range of motion    PT Next Visit Plan  progress gentle LE stretching and lumbopelvic ROM    Consulted and Agree with Plan of Care  Patient       Patient will benefit from skilled therapeutic intervention in order to improve the following deficits and impairments:  Abnormal gait, Decreased activity tolerance, Decreased strength, Increased fascial restricitons, Pain, Increased muscle spasms, Difficulty walking, Decreased balance, Decreased range of motion, Improper body mechanics, Postural dysfunction, Impaired flexibility  Visit Diagnosis: Acute right-sided low back pain with right-sided sciatica  Difficulty in walking, not elsewhere classified  Other abnormalities of gait and mobility  Other symptoms and signs involving the musculoskeletal system     Problem List Patient Active Problem List   Diagnosis Date Noted  . Subacromial impingement of left shoulder 12/30/2018  . Moderate persistent asthma without complication [id number]  . Current use of beta blocker 06/30/2018  . Allergic rhinitis 06/30/2018  . Acute bronchitis due to other specified organisms 06/30/2018  . History of endometrial cancer  02/03/2018  . Screening Mammogram 11/05/2017  . 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  . Type 2 diabetes mellitus with diabetic neuropathy, without long-term current use of insulin ([place]) 10/25/2016  . Microcytosis 09/28/2016  . Mild intermittent asthma without complication 0000000  . Port catheter in place 11/15/2015  . Chemotherapy induced neutropenia ([place]) 11/29/2014  . Family history of breast cancer   . Family history of ovarian cancer   . Family history of prostate cancer   . Family history of colon cancer   . Morbid obesity with body mass index of 40.0-44.9 in adult [facility]) 11/09/2014  . Chemotherapy-induced peripheral neuropathy ([place]) 09/22/2014  . Endometrial cancer ([place]) 05/20/2014  . 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  . Thyroid disease 06/01/2013  . Low back pain radiating to right leg 08/18/2012  . Anemia 11/07/2011  . Hyperlipidemia 06/15/2011  . Diabetes mellitus type 2 in obese ([place]) 03/12/2011  . Acute pain of right knee 01/27/2011  . Obstructive sleep apnea 11/15/2010  . Osteoarthritis 11/15/2010  . Gastroesophageal reflux disease without esophagitis 09/14/2010  . DIVERTICULITIS, HX OF 09/14/2010  . POSTMENOPAUSAL STATUS 10/01/2008  . COLONIC POLYPS, HX OF 03/10/2008  . Essential hypertension 08/13/2006     Janene Harvey, PT, DPT 06/16/19 11:04 AM   [facility] [address]  [place], [place], [address] Phone: [phone]   Fax:  [phone]  Name: LUAN JAGOW MRN: [id number] Date of Birth: 06-13-1951

## 2019-06-23 ENCOUNTER — Ambulatory Visit: Payer: Medicare PPO | Attending: Family Medicine | Admitting: Physical Therapy

## 2019-06-23 ENCOUNTER — Other Ambulatory Visit: Payer: Self-pay

## 2019-06-23 ENCOUNTER — Encounter: Payer: Self-pay | Admitting: Physical Therapy

## 2019-06-23 DIAGNOSIS — R29898 Other symptoms and signs involving the musculoskeletal system: Secondary | ICD-10-CM | POA: Diagnosis not present

## 2019-06-23 DIAGNOSIS — M5441 Lumbago with sciatica, right side: Secondary | ICD-10-CM | POA: Diagnosis not present

## 2019-06-23 DIAGNOSIS — R262 Difficulty in walking, not elsewhere classified: Secondary | ICD-10-CM | POA: Diagnosis not present

## 2019-06-23 DIAGNOSIS — R2689 Other abnormalities of gait and mobility: Secondary | ICD-10-CM | POA: Insufficient documentation

## 2019-06-23 NOTE — Patient Instructions (Signed)
TENS stands for Transcutaneous Electrical Nerve Stimulation. In other words, electrical impulses are allowed to pass through the skin in order to excite a nerve.   Purpose and Use of TENS:  TENS is a method used to manage acute and chronic pain without the use of drugs. It has been effective in managing pain associated with surgery, sprains, strains, trauma, rheumatoid arthritis, and neuralgias. It is a non-addictive, low risk, and non-invasive technique used to control pain. It is not, by any means, a curative form of treatment.   How TENS Works:  Most TENS units are a small pocket-sized unit powered by one 9 volt battery. Attached to the outside of the unit are two lead wires where two pins and/or snaps connect on each wire. All units come with a set of four reusable pads or electrodes. These are placed on the skin surrounding the area involved. By inserting the leads into  the pads, the electricity can pass from the unit making the circuit complete.  As the intensity is turned up slowly, the electrical current enters the body from the electrodes through the skin to the surrounding nerve fibers. This triggers the release of hormones from within the body. These hormones contain pain relievers. By increasing the circulation of these hormones, the person's pain may be lessened. It is also believed that the electrical stimulation itself helps to block the pain messages being sent to the brain, thus also decreasing the body's perception of pain.   Hazards:  TENS units are NOT to be used by patients with PACEMAKERS, DEFIBRILLATORS, DIABETIC PUMPS, PREGNANT WOMEN, and patients with SEIZURE DISORDERS.  TENS units are NOT to be used over the heart, throat, brain, or spinal cord.  One of the major side effects from the TENS unit may be skin irritation. Some people may develop a rash if they are sensitive to the materials used in the electrodes or the connecting wires.   Wear the unit for 15 minutes.   Avoid  overuse due the body getting used to the stem making it not as effective over time.     TENS UNIT  This is helpful for muscle pain and spasm.   Search and Purchase a TENS 7000 2nd edition at www.tenspros.com or www.amazon.com  (It should be less than $30)     TENS unit instructions:   Do not shower or bathe with the unit on  Turn the unit off before removing electrodes or batteries  If the electrodes lose stickiness add a drop of water to the electrodes after they are disconnected from the unit and place on plastic sheet. If you continued to have difficulty, call the TENS unit company to purchase more electrodes.  Do not apply lotion on the skin area prior to use. Make sure the skin is clean and dry as this will help prolong the life of the electrodes.  After use, always check skin for unusual red areas, rash or other skin difficulties. If there are any skin problems, does not apply electrodes to the same area.  Never remove the electrodes from the unit by pulling the wires.  Do not use the TENS unit or electrodes other than as directed.  Do not change electrode placement without consulting your therapist or physician.  Keep 2 fingers with between each electrode.       

## 2019-06-23 NOTE — Therapy (Signed)
[facility] [address]  [place], [place], [address] Phone: [phone]   Fax:  [phone]  Physical Therapy Treatment  Patient Details  Name: Katelyn Fisher MRN: [id number] Date of Birth: 11-30-1950 Referring Provider (PT): [name], MD   Encounter Date: 06/23/2019  PT End of Session - 06/23/19 1217    Visit Number  5    Number of Visits  13    Date for PT Re-Evaluation  07/16/19    Authorization Type  [employer] & [place]    PT Start Time  [phone]    PT Stop Time  1027    PT Time Calculation (min)  56 min    Activity Tolerance  Patient tolerated treatment well    Behavior During Therapy  [facility] for tasks assessed/performed       Past Medical History:  Diagnosis Date  . Anxiety   . Arthritis    back  . Asthma   . Benign paroxysmal positional vertigo 11/01/2012  . Cancer ([place]) 12/22/2014   endometrial  . Depression   . Endometrial cancer ([place]) 2015  . Family history of breast cancer   . Family history of colon cancer   . Family history of ovarian cancer   . Family history of prostate cancer   . GERD (gastroesophageal reflux disease)   . History of colonic diverticulitis   . Hx of colonic polyps   . Hyperlipidemia   . Hypertension   . 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  . Low back pain radiating to right leg 08/18/2012  . Obesity   . Osteopenia 09/2017   T score -1.2 FRAX 3.2% / 0.3%  . Personal history of chemotherapy 2015  . Radiation 12/17/14-01/03/15   vaginal brachytherapy  . Thyroid disease 06/01/2013    Past Surgical History:  Procedure Laterality Date  . ABDOMINAL HYSTERECTOMY  06/23/14   [facility], TRH/BSO  . CHOLECYSTECTOMY  1991  . DILATION AND CURETTAGE OF UTERUS    . IR REMOVAL TUN ACCESS W/ PORT W/O FL MOD SED  04/30/2017  . TUBAL LIGATION      There were no vitals filed for this visit.  Subjective Assessment - 06/23/19 0932    Subjective  Had a rough day during the holidays but it  eased up after performing HEP and using the ball to massage her back. Feeling pretty good today. Reporting most benefit from massage and e-sitm.    Pertinent History  thyroid disease, hx of endometrial CA treated with chemo, osteopenia, LBP HTN, HLD, GERD, BPPV, asthma    Diagnostic tests  05/11/19 lumbar xray: Mild-to-moderate multilevel lumbar spine DDD, worse at L4-L5 and L5-S1. Grade 1 anterolisthesis of L4 upon L5    Patient Stated Goals  get rid of pain    Currently in Pain?  Yes    Pain Score  4     Pain Location  Back    Pain Orientation  Right;Lower    Pain Descriptors / Indicators  Aching    Pain Type  Acute pain                       [facility] Adult PT Treatment/Exercise - 06/23/19 0001      Exercises   Exercises  Knee/Hip      Lumbar Exercises: Aerobic   Nustep  L4 x 6 min (UEs/LEs)      Lumbar Exercises: Standing   Other Standing Lumbar Exercises  R/L QL stretch in  doorway 2x20-30" to tolerance      Lumbar Exercises: Seated   Other Seated Lumbar Exercises  lumbar flexion pball rollouts 10x5" ; 5x5" to R and L to tolerance      Knee/Hip Exercises: Standing   Hip Abduction  Stengthening;Right;Left;1 set;10 reps;Knee straight    Abduction Limitations  yellow loop around ankles; at counter top   cues to avoid trunk lean   Hip Extension  Stengthening;Right;Left;1 set;10 reps;Knee straight    Extension Limitations  cues to avoid trunk lean    Other Standing Knee Exercises  paloff press with red TB x10 each side    manual cues to maintain neutral hand positioning     Moist Heat Therapy   Number Minutes Moist Heat  15 Minutes    Moist Heat Location  Lumbar Spine   R     Electrical Stimulation   Electrical Stimulation Location  B lumbar paraspinals    Electrical Stimulation Action  IFC    Electrical Stimulation Parameters  80-150 hz; output 15 to tol; 15 min    Electrical Stimulation Goals  Pain      Manual Therapy   Manual Therapy  Soft tissue  mobilization;Myofascial release    Manual therapy comments  L sidelying    Soft tissue mobilization  STM to R proximal glutes, piriformis, lumbar paraspinals- most soft tissue restriction remaining in proximal glutes    Myofascial Release  manual TPR to R proximal glutes and piriformis- tender trigger pts evident              PT Education - 06/23/19 1217    Education Details  edu on TENs use, wear time, precautions/contraindications    Person(s) Educated  Patient    Methods  Explanation;Demonstration;Tactile cues;Verbal cues;Handout    Comprehension  Verbalized understanding;Returned demonstration       PT Short Term Goals - 06/23/19 1218      PT SHORT TERM GOAL #1   Title  Patient to be independent with initial HEP.    Time  3    Period  Weeks    Status  Achieved    Target Date  06/25/19        PT Long Term Goals - 06/09/19 0910      PT LONG TERM GOAL #1   Title  Patient to be independent with advanced HEP.    Time  6    Period  Weeks    Status  On-going      PT LONG TERM GOAL #2   Title  Patient to demonstrate B LE strength >=4+/5.    Time  6    Period  Weeks    Status  New      PT LONG TERM GOAL #3   Title  Patient to demonstrate lumbar AROM WFL and with pain <3/10.    Time  6    Period  Weeks    Status  New      PT LONG TERM GOAL #4   Title  Patient to score <14 sec on TUG testing with LRAD to decrease risk of falls.    Period  Weeks    Status  New      PT LONG TERM GOAL #5   Title  Patient to report tolerance of 1 hour on her feet without pain limiting.    Time  6    Period  Weeks    Status  New  Plan - 06/23/19 1217    Clinical Impression Statement  Patient reporting having R LB over the recent holiday which was relieved with HEP and self-massage. Still having most difficulty with picking up objects from the floor. Does report most benefit from massage and e-stim thus far. Worked on gentle QL stretching with patient requiring  VC's/TC's for correction of positioning and set up. Able to perform hip strengthening with light banded resistance with cues to maintain upright posture throughout. Challenged core strengthening with banded resistance with patient reporting slight discomfort in LB despite cues to contract core throughout. Tolerated STM to R glutes and piriformis with report of good relief of pain. Ended session with e-stim and moist heat to LB for relief of soft tissue restriction and pain as patient reporting relief with this modality. Also educated patient on personal TENS use at home- believe patient would receive maximal benefit from this modality at home for continued pain relief with functional activities. Patient reported understanding to all edu and without complaints at end of session.    Comorbidities  thyroid disease, hx of endometrial CA treated with chemo, osteopenia, LBP HTN, HLD, GERD, BPPV, asthma    Examination-Activity Limitations  Sit;Bathing;Bed Mobility;Sleep;Bend;Squat;Stairs;Caring for Others;Carry;Stand;Toileting;Dressing;Transfers;Hygiene/Grooming;Lift;Locomotion Level;Reach Overhead    Examination-Participation Restrictions  Church;Cleaning;Shop;Community Activity;Volunteer;Driving;Yard Work;Interpersonal Relationship;Laundry;Meal Prep    Stability/Clinical Decision Making  Evolving/Moderate complexity    Rehab Potential  Good    PT Frequency  2x / week    PT Duration  6 weeks    PT Treatment/Interventions  ADLs/Self Care Home Management;Cryotherapy;Electrical Stimulation;Moist Heat;Balance training;Therapeutic exercise;Therapeutic activities;Functional mobility training;Stair training;Gait training;Ultrasound;Neuromuscular re-education;Patient/family education;Orthotic Fit/Training;Manual techniques;Vasopneumatic Device;Taping;Energy conservation;Dry needling;Passive range of motion    PT Next Visit Plan  progress gentle LE stretching and lumbopelvic ROM    Consulted and Agree with Plan of Care   Patient       Patient will benefit from skilled therapeutic intervention in order to improve the following deficits and impairments:  Abnormal gait, Decreased activity tolerance, Decreased strength, Increased fascial restricitons, Pain, Increased muscle spasms, Difficulty walking, Decreased balance, Decreased range of motion, Improper body mechanics, Postural dysfunction, Impaired flexibility  Visit Diagnosis: Acute right-sided low back pain with right-sided sciatica  Difficulty in walking, not elsewhere classified  Other abnormalities of gait and mobility  Other symptoms and signs involving the musculoskeletal system     Problem List Patient Active Problem List   Diagnosis Date Noted  . Subacromial impingement of left shoulder 12/30/2018  . Moderate persistent asthma without complication [id number]  . Current use of beta blocker 06/30/2018  . Allergic rhinitis 06/30/2018  . Acute bronchitis due to other specified organisms 06/30/2018  . History of endometrial cancer 02/03/2018  . Screening Mammogram 11/05/2017  . 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  . Type 2 diabetes mellitus with diabetic neuropathy, without long-term current use of insulin ([place]) 10/25/2016  . Microcytosis 09/28/2016  . Mild intermittent asthma without complication 0000000  . Port catheter in place 11/15/2015  . Chemotherapy induced neutropenia ([place]) 11/29/2014  . Family history of breast cancer   . Family history of ovarian cancer   . Family history of prostate cancer   . Family history of colon cancer   . Morbid obesity with body mass index of 40.0-44.9 in adult [facility]) 11/09/2014  . Chemotherapy-induced peripheral neuropathy ([place]) 09/22/2014  . Endometrial cancer ([place]) 05/20/2014  . 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  . Thyroid disease 06/01/2013  . Low back pain radiating to right leg 08/18/2012  . Anemia 11/07/2011  .  Hyperlipidemia 06/15/2011  . Diabetes mellitus type 2 in  obese ([place]) 03/12/2011  . Acute pain of right knee 01/27/2011  . Obstructive sleep apnea 11/15/2010  . Osteoarthritis 11/15/2010  . Gastroesophageal reflux disease without esophagitis 09/14/2010  . DIVERTICULITIS, HX OF 09/14/2010  . POSTMENOPAUSAL STATUS 10/01/2008  . COLONIC POLYPS, HX OF 03/10/2008  . Essential hypertension 08/13/2006      Janene Harvey, PT, DPT 06/23/19 12:19 PM   [facility] [address]  [place], [place], [address] Phone: [phone]   Fax:  [phone]  Name: DEWAYNA GUIANG MRN: [id number] Date of Birth: 03-25-1951

## 2019-06-26 ENCOUNTER — Ambulatory Visit: Payer: Medicare PPO | Admitting: Physical Therapy

## 2019-06-26 ENCOUNTER — Other Ambulatory Visit: Payer: Self-pay

## 2019-06-26 ENCOUNTER — Encounter: Payer: Self-pay | Admitting: Physical Therapy

## 2019-06-26 DIAGNOSIS — R262 Difficulty in walking, not elsewhere classified: Secondary | ICD-10-CM | POA: Diagnosis not present

## 2019-06-26 DIAGNOSIS — R2689 Other abnormalities of gait and mobility: Secondary | ICD-10-CM | POA: Diagnosis not present

## 2019-06-26 DIAGNOSIS — M5441 Lumbago with sciatica, right side: Secondary | ICD-10-CM | POA: Diagnosis not present

## 2019-06-26 DIAGNOSIS — R29898 Other symptoms and signs involving the musculoskeletal system: Secondary | ICD-10-CM

## 2019-06-26 NOTE — Therapy (Signed)
[facility] [address]  [place], [place], [address] Phone: [phone]   Fax:  [phone]  Physical Therapy Treatment  Patient Details  Name: Katelyn Fisher MRN: [id number] Date of Birth: [birth year] Referring Provider (PT): [name], MD   Encounter Date: 06/26/2019  PT End of Session - 06/26/19 1151    Visit Number  6    Number of Visits  13    Date for PT Re-Evaluation  07/16/19    Authorization Type  [employer] & [place]    PT Start Time  0930    PT Stop Time  1014    PT Time Calculation (min)  44 min    Activity Tolerance  Patient tolerated treatment well;Patient limited by pain    Behavior During Therapy  [facility] for tasks assessed/performed       Past Medical History:  Diagnosis Date  . Anxiety   . Arthritis    back  . Asthma   . Benign paroxysmal positional vertigo 11/01/2012  . Cancer ([place]) 12/22/2014   endometrial  . Depression   . Endometrial cancer ([place]) 2015  . Family history of breast cancer   . Family history of colon cancer   . Family history of ovarian cancer   . Family history of prostate cancer   . GERD (gastroesophageal reflux disease)   . History of colonic diverticulitis   . Hx of colonic polyps   . Hyperlipidemia   . Hypertension   . 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  . Low back pain radiating to right leg 08/18/2012  . Obesity   . Osteopenia 09/2017   T score -1.2 FRAX 3.2% / 0.3%  . Personal history of chemotherapy 2015  . Radiation 12/17/14-01/03/15   vaginal brachytherapy  . Thyroid disease 06/01/2013    Past Surgical History:  Procedure Laterality Date  . ABDOMINAL HYSTERECTOMY  06/23/14   [facility], TRH/BSO  . CHOLECYSTECTOMY  1991  . DILATION AND CURETTAGE OF UTERUS    . IR REMOVAL TUN ACCESS W/ PORT W/O FL MOD SED  04/30/2017  . TUBAL LIGATION      There were no vitals filed for this visit.  Subjective Assessment - 06/26/19 0931    Subjective  Doing okay today.  Having some pain but not as bad as usual. Is interested in getting a personal TENS unit as this gives her good pain relief.    Pertinent History  thyroid disease, hx of endometrial CA treated with chemo, osteopenia, LBP HTN, HLD, GERD, BPPV, asthma    Diagnostic tests  05/11/19 lumbar xray: Mild-to-moderate multilevel lumbar spine DDD, worse at L4-L5 and L5-S1. Grade 1 anterolisthesis of L4 upon L5    Patient Stated Goals  get rid of pain    Currently in Pain?  Yes    Pain Score  4     Pain Location  Back    Pain Orientation  Right;Lower    Pain Descriptors / Indicators  Aching    Pain Type  Acute pain         [facility] PT Assessment - 06/26/19 0001 (measurements carried over from 06/04/19)     Strength   Right Hip Flexion  4-/5    Right Hip ABduction  4+/5    Right Hip ADduction  4/5    Left Hip Flexion  4+/5    Left Hip ABduction  4+/5    Left Hip ADduction  4/5    Right Knee Flexion  4/5  Right Knee Extension  4/5    Left Knee Flexion  4/5    Left Knee Extension  4+/5    Right Ankle Dorsiflexion  4/5    Right Ankle Plantar Flexion  4/5    Left Ankle Dorsiflexion  4+/5    Left Ankle Plantar Flexion  4+/5                   [facility] Adult PT Treatment/Exercise - 06/26/19 0001      Lumbar Exercises: Stretches   Piriformis Stretch  Right;1 rep;30 seconds    Piriformis Stretch Limitations  supine KTOS     Figure 4 Stretch  1 rep;30 seconds;With overpressure    Figure 4 Stretch Limitations  supine    Other Lumbar Stretch Exercise  R/L QL stretch in doorway x30" each way      Lumbar Exercises: Aerobic   Nustep  L4 x 6 min (UEs/LEs)      Lumbar Exercises: Standing   Other Standing Lumbar Exercises  R/L resisted trunk rotation x10 with red TB   cues for control and rhythmic breathing     Lumbar Exercises: Sidelying   Other Sidelying Lumbar Exercises  open book stretch x10 each side to tol      Knee/Hip Exercises: Standing   Hip Extension  Stengthening;Right;Left;1  set;10 reps;Knee bent    Extension Limitations  standing donkey kicks at counter top   cues to decrease ROM to avoid LBP   Functional Squat  10 reps;2 sets    Functional Squat Limitations  shallow squat at counter; 2nd set with red TB above knees   cues to bring bottom back     Manual Therapy   Manual Therapy  Soft tissue mobilization;Myofascial release    Manual therapy comments  prone    Soft tissue mobilization  STM to R proximal and lateral glutes, piriformis, lumbar paraspinals and QL- most soft tissue restriction remaining in proximal glutes- increased soft tissue restriction and tenderness throughout    Myofascial Release  manual TPR to R proximal and lateral glutes and piriformis- tender trigger pts evident                PT Short Term Goals - 06/23/19 1218      PT SHORT TERM GOAL #1   Title  Patient to be independent with initial HEP.    Time  3    Period  Weeks    Status  Achieved    Target Date  06/25/19        PT Long Term Goals - 06/09/19 0910      PT LONG TERM GOAL #1   Title  Patient to be independent with advanced HEP.    Time  6    Period  Weeks    Status  On-going      PT LONG TERM GOAL #2   Title  Patient to demonstrate B LE strength >=4+/5.    Time  6    Period  Weeks    Status  New      PT LONG TERM GOAL #3   Title  Patient to demonstrate lumbar AROM WFL and with pain <3/10.    Time  6    Period  Weeks    Status  New      PT LONG TERM GOAL #4   Title  Patient to score <14 sec on TUG testing with LRAD to decrease risk of falls.    Period  Weeks  Status  New      PT LONG TERM GOAL #5   Title  Patient to report tolerance of 1 hour on her feet without pain limiting.    Time  6    Period  Weeks    Status  New            Plan - 06/26/19 1151    Clinical Impression Statement  Patient without new complaints this AM. Worked on standing core and hip strengthening at beginning od session. Patient still with slight discomfort in LB  with exercises requiring both neutral spine and lumbopelvic ROM. Patient reporting that she does receive relief with QL stretch, thus worked on this stretch as well as open book stretching for pain relief. Patient received STM to R proximal and lateral glutes, piriformis, lumbar paraspinals and QL. Patient demonstrated increased soft tissue restriction and tenderness throughout muscles compared to previous session. Patient did report relief after manual therapy and without complaints at end of session. Patient gave verbal consent for release of insurance information to [facility] for information about personal TENS unit coverage. Believe patient would benefit from personal TENS use at home d/t acuity of pain as well as resultant muscle weakness- see objective measures.    Comorbidities  thyroid disease, hx of endometrial CA treated with chemo, osteopenia, LBP HTN, HLD, GERD, BPPV, asthma    Examination-Activity Limitations  Sit;Bathing;Bed Mobility;Sleep;Bend;Squat;Stairs;Caring for Others;Carry;Stand;Toileting;Dressing;Transfers;Hygiene/Grooming;Lift;Locomotion Level;Reach Overhead    Examination-Participation Restrictions  Church;Cleaning;Shop;Community Activity;Volunteer;Driving;Yard Work;Interpersonal Relationship;Laundry;Meal Prep    Stability/Clinical Decision Making  Evolving/Moderate complexity    Rehab Potential  Good    PT Frequency  2x / week    PT Duration  6 weeks    PT Treatment/Interventions  ADLs/Self Care Home Management;Cryotherapy;Electrical Stimulation;Moist Heat;Balance training;Therapeutic exercise;Therapeutic activities;Functional mobility training;Stair training;Gait training;Ultrasound;Neuromuscular re-education;Patient/family education;Orthotic Fit/Training;Manual techniques;Vasopneumatic Device;Taping;Energy conservation;Dry needling;Passive range of motion    PT Next Visit Plan  progress gentle LE stretching and lumbopelvic ROM    Consulted and Agree with Plan of Care  Patient        Patient will benefit from skilled therapeutic intervention in order to improve the following deficits and impairments:  Abnormal gait, Decreased activity tolerance, Decreased strength, Increased fascial restricitons, Pain, Increased muscle spasms, Difficulty walking, Decreased balance, Decreased range of motion, Improper body mechanics, Postural dysfunction, Impaired flexibility  Visit Diagnosis: Acute right-sided low back pain with right-sided sciatica  Difficulty in walking, not elsewhere classified  Other abnormalities of gait and mobility  Other symptoms and signs involving the musculoskeletal system     Problem List Patient Active Problem List   Diagnosis Date Noted  . Subacromial impingement of left shoulder 12/30/2018  . Moderate persistent asthma without complication [id number]  . Current use of beta blocker 06/30/2018  . Allergic rhinitis 06/30/2018  . Acute bronchitis due to other specified organisms 06/30/2018  . History of endometrial cancer 02/03/2018  . Screening Mammogram 11/05/2017  . 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  . Type 2 diabetes mellitus with diabetic neuropathy, without long-term current use of insulin ([place]) 10/25/2016  . Microcytosis 09/28/2016  . Mild intermittent asthma without complication 0000000  . Port catheter in place 11/15/2015  . Chemotherapy induced neutropenia ([place]) 11/29/2014  . Family history of breast cancer   . Family history of ovarian cancer   . Family history of prostate cancer   . Family history of colon cancer   . Morbid obesity with body mass index of 40.0-44.9 in adult [facility]) 11/09/2014  . Chemotherapy-induced peripheral  neuropathy ([place]) 09/22/2014  . Endometrial cancer ([place]) 05/20/2014  . 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  . Thyroid disease 06/01/2013  . Low back pain radiating to right leg 08/18/2012  . Anemia 11/07/2011  . Hyperlipidemia 06/15/2011  . Diabetes mellitus type 2 in obese ([place])  03/12/2011  . Acute pain of right knee 01/27/2011  . Obstructive sleep apnea 11/15/2010  . Osteoarthritis 11/15/2010  . Gastroesophageal reflux disease without esophagitis 09/14/2010  . DIVERTICULITIS, HX OF 09/14/2010  . POSTMENOPAUSAL STATUS 10/01/2008  . COLONIC POLYPS, HX OF 03/10/2008  . Essential hypertension 08/13/2006     Janene Harvey, PT, DPT 06/26/19 11:59 AM   [facility] [address]  [place], [place], [address] Phone: [phone]   Fax:  [phone]  Name: Katelyn Fisher MRN: [id number] Date of Birth: 07-Jul-1951

## 2019-06-30 ENCOUNTER — Other Ambulatory Visit: Payer: Self-pay

## 2019-06-30 ENCOUNTER — Ambulatory Visit: Payer: Medicare PPO | Admitting: Physical Therapy

## 2019-06-30 ENCOUNTER — Encounter: Payer: Self-pay | Admitting: Physical Therapy

## 2019-06-30 DIAGNOSIS — R29898 Other symptoms and signs involving the musculoskeletal system: Secondary | ICD-10-CM

## 2019-06-30 DIAGNOSIS — R262 Difficulty in walking, not elsewhere classified: Secondary | ICD-10-CM | POA: Diagnosis not present

## 2019-06-30 DIAGNOSIS — R2689 Other abnormalities of gait and mobility: Secondary | ICD-10-CM

## 2019-06-30 DIAGNOSIS — M5441 Lumbago with sciatica, right side: Secondary | ICD-10-CM

## 2019-06-30 NOTE — Therapy (Signed)
[facility] [address]  [place], [place], [address] Phone: [phone]   Fax:  [phone]  Physical Therapy Treatment  Patient Details  Name: Katelyn Fisher MRN: [id number] Date of Birth: 04/17/1951 Referring Provider (PT): [name], MD   Encounter Date: 06/30/2019  PT End of Session - 06/30/19 1219    Visit Number  7    Number of Visits  13    Date for PT Re-Evaluation  07/16/19    Authorization Type  [employer]    PT Start Time  [phone]    PT Stop Time  1026    PT Time Calculation (min)  57 min    Activity Tolerance  Patient tolerated treatment well;Patient limited by pain    Behavior During Therapy  [facility] for tasks assessed/performed       Past Medical History:  Diagnosis Date  . Anxiety   . Arthritis    back  . Asthma   . Benign paroxysmal positional vertigo 11/01/2012  . Cancer ([place]) 12/22/2014   endometrial  . Depression   . Endometrial cancer ([place]) 2015  . Family history of breast cancer   . Family history of colon cancer   . Family history of ovarian cancer   . Family history of prostate cancer   . GERD (gastroesophageal reflux disease)   . History of colonic diverticulitis   . Hx of colonic polyps   . Hyperlipidemia   . Hypertension   . 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  . Low back pain radiating to right leg 08/18/2012  . Obesity   . Osteopenia 09/2017   T score -1.2 FRAX 3.2% / 0.3%  . Personal history of chemotherapy 2015  . Radiation 12/17/14-01/03/15   vaginal brachytherapy  . Thyroid disease 06/01/2013    Past Surgical History:  Procedure Laterality Date  . ABDOMINAL HYSTERECTOMY  06/23/14   [facility], TRH/BSO  . CHOLECYSTECTOMY  1991  . DILATION AND CURETTAGE OF UTERUS    . IR REMOVAL TUN ACCESS W/ PORT W/O FL MOD SED  04/30/2017  . TUBAL LIGATION      There were no vitals filed for this visit.  Subjective Assessment - 06/30/19 0928    Subjective  Patient reporting  that her back is improving, but not as fast as she would like.    Pertinent History  thyroid disease, hx of endometrial CA treated with chemo, osteopenia, LBP HTN, HLD, GERD, BPPV, asthma    Diagnostic tests  05/11/19 lumbar xray: Mild-to-moderate multilevel lumbar spine DDD, worse at L4-L5 and L5-S1. Grade 1 anterolisthesis of L4 upon L5    Patient Stated Goals  get rid of pain    Currently in Pain?  Yes    Pain Score  3     Pain Location  Back    Pain Orientation  Right;Lower    Pain Descriptors / Indicators  Aching    Pain Type  Acute pain                       [facility] Adult PT Treatment/Exercise - 06/30/19 0001      Lumbar Exercises: Aerobic   Nustep  L4 x 6 min (UEs/LEs)      Lumbar Exercises: Machines for Strengthening   Other Lumbar Machine Exercise  narrow grip row 20# x15      Lumbar Exercises: Seated   Hip Flexion on Ball  Strengthening;Both;10 reps    Hip Flexion on  Ball Limitations  alt hip flexion on green pball   cues to increase control on eccentric phase   Other Seated Lumbar Exercises  sitting sidebody stretch with rhythmic breathing 5x5" each side    Other Seated Lumbar Exercises  sitting on green pball pelvic tilts x10 each side   difficluty with both movements     Lumbar Exercises: Supine   Dead Bug  10 reps    Dead Bug Limitations  cues for UE/LE positioning    Other Supine Lumbar Exercises  LTR to tolerance x20      Lumbar Exercises: Quadruped   Madcat/Old Horse  10 reps    Madcat/Old Horse Limitations  cues to promote posterior pelvic tilt    Other Quadruped Lumbar Exercises  child's pose stretch 10x3"    Other Quadruped Lumbar Exercises  R/L tail wags x10 each way      Knee/Hip Exercises: Supine   Bridges with Ball Squeeze  Strengthening;Both;1 set;10 reps   mild pain in LB     Knee/Hip Exercises: Prone   Hip Extension  Strengthening;Right;Left;1 set;10 reps    Hip Extension Limitations  considerable difficulty on R LE      Moist  Heat Therapy   Number Minutes Moist Heat  15 Minutes    Moist Heat Location  Lumbar Spine      Electrical Stimulation   Electrical Stimulation Location  R lumbar paraspinals    Electrical Stimulation Action  IFC    Electrical Stimulation Parameters  80-150 hz; output to tolerance; 15 min    Electrical Stimulation Goals  Pain               PT Short Term Goals - 06/23/19 1218      PT SHORT TERM GOAL #1   Title  Patient to be independent with initial HEP.    Time  3    Period  Weeks    Status  Achieved    Target Date  06/25/19        PT Long Term Goals - 06/09/19 0910      PT LONG TERM GOAL #1   Title  Patient to be independent with advanced HEP.    Time  6    Period  Weeks    Status  On-going      PT LONG TERM GOAL #2   Title  Patient to demonstrate B LE strength >=4+/5.    Time  6    Period  Weeks    Status  New      PT LONG TERM GOAL #3   Title  Patient to demonstrate lumbar AROM WFL and with pain <3/10.    Time  6    Period  Weeks    Status  New      PT LONG TERM GOAL #4   Title  Patient to score <14 sec on TUG testing with LRAD to decrease risk of falls.    Period  Weeks    Status  New      PT LONG TERM GOAL #5   Title  Patient to report tolerance of 1 hour on her feet without pain limiting.    Time  6    Period  Weeks    Status  New            Plan - 06/30/19 1219    Clinical Impression Statement  Patient reporting slow progress with her LBP thus far despite compliance with HE{. Introduced new stretching and lumbopelvic ROM  exercises for hopeful improvement in patient's symptoms.  Able to perform quadruped ther-ex with heavy cues for form. Patient reporting no limitations d/t back pain, however did report knee pain in this position, thus quadruped exercises limited. Worked on glute strengthening with patient demonstrating good form and ROM on L LE, however reporting mild pain and demonstrating considerable difficulty on R LE. Ended session  with e-stim and moist heat to LB for pain relief. Patient without complaints at end of session.    Comorbidities  thyroid disease, hx of endometrial CA treated with chemo, osteopenia, LBP HTN, HLD, GERD, BPPV, asthma    Examination-Activity Limitations  Sit;Bathing;Bed Mobility;Sleep;Bend;Squat;Stairs;Caring for Others;Carry;Stand;Toileting;Dressing;Transfers;Hygiene/Grooming;Lift;Locomotion Level;Reach Overhead    Examination-Participation Restrictions  Church;Cleaning;Shop;Community Activity;Volunteer;Driving;Yard Work;Interpersonal Relationship;Laundry;Meal Prep    Stability/Clinical Decision Making  Evolving/Moderate complexity    Rehab Potential  Good    PT Frequency  2x / week    PT Duration  6 weeks    PT Treatment/Interventions  ADLs/Self Care Home Management;Cryotherapy;Electrical Stimulation;Moist Heat;Balance training;Therapeutic exercise;Therapeutic activities;Functional mobility training;Stair training;Gait training;Ultrasound;Neuromuscular re-education;Patient/family education;Orthotic Fit/Training;Manual techniques;Vasopneumatic Device;Taping;Energy conservation;Dry needling;Passive range of motion    PT Next Visit Plan  progress gentle LE stretching and lumbopelvic ROM    Consulted and Agree with Plan of Care  Patient       Patient will benefit from skilled therapeutic intervention in order to improve the following deficits and impairments:  Abnormal gait, Decreased activity tolerance, Decreased strength, Increased fascial restricitons, Pain, Increased muscle spasms, Difficulty walking, Decreased balance, Decreased range of motion, Improper body mechanics, Postural dysfunction, Impaired flexibility  Visit Diagnosis: Acute right-sided low back pain with right-sided sciatica  Difficulty in walking, not elsewhere classified  Other abnormalities of gait and mobility  Other symptoms and signs involving the musculoskeletal system     Problem List Patient Active Problem List    Diagnosis Date Noted  . Subacromial impingement of left shoulder 12/30/2018  . Moderate persistent asthma without complication [id number]  . Current use of beta blocker 06/30/2018  . Allergic rhinitis 06/30/2018  . Acute bronchitis due to other specified organisms 06/30/2018  . History of endometrial cancer 02/03/2018  . Screening Mammogram 11/05/2017  . 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  . Type 2 diabetes mellitus with diabetic neuropathy, without long-term current use of insulin ([place]) 10/25/2016  . Microcytosis 09/28/2016  . Mild intermittent asthma without complication 0000000  . Port catheter in place 11/15/2015  . Chemotherapy induced neutropenia ([place]) 11/29/2014  . Family history of breast cancer   . Family history of ovarian cancer   . Family history of prostate cancer   . Family history of colon cancer   . Morbid obesity with body mass index of 40.0-44.9 in adult [facility]) 11/09/2014  . Chemotherapy-induced peripheral neuropathy ([place]) 09/22/2014  . Endometrial cancer ([place]) 05/20/2014  . 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  . Thyroid disease 06/01/2013  . Low back pain radiating to right leg 08/18/2012  . Anemia 11/07/2011  . Hyperlipidemia 06/15/2011  . Diabetes mellitus type 2 in obese ([place]) 03/12/2011  . Acute pain of right knee 01/27/2011  . Obstructive sleep apnea 11/15/2010  . Osteoarthritis 11/15/2010  . Gastroesophageal reflux disease without esophagitis 09/14/2010  . DIVERTICULITIS, HX OF 09/14/2010  . POSTMENOPAUSAL STATUS 10/01/2008  . COLONIC POLYPS, HX OF 03/10/2008  . Essential hypertension 08/13/2006      Janene Harvey, PT, DPT 06/30/19 12:22 PM   [facility] [address]  [place], [place], [address] Phone: [phone]  Fax:  [phone]  Name: Katelyn Fisher MRN: [id number] Date of Birth: [birth year]

## 2019-07-03 ENCOUNTER — Other Ambulatory Visit: Payer: Self-pay

## 2019-07-03 ENCOUNTER — Encounter: Payer: Self-pay | Admitting: Physical Therapy

## 2019-07-03 ENCOUNTER — Ambulatory Visit: Payer: Medicare PPO | Admitting: Physical Therapy

## 2019-07-03 DIAGNOSIS — R29898 Other symptoms and signs involving the musculoskeletal system: Secondary | ICD-10-CM

## 2019-07-03 DIAGNOSIS — M5441 Lumbago with sciatica, right side: Secondary | ICD-10-CM | POA: Diagnosis not present

## 2019-07-03 DIAGNOSIS — R262 Difficulty in walking, not elsewhere classified: Secondary | ICD-10-CM | POA: Diagnosis not present

## 2019-07-03 DIAGNOSIS — R2689 Other abnormalities of gait and mobility: Secondary | ICD-10-CM | POA: Diagnosis not present

## 2019-07-03 NOTE — Therapy (Signed)
[facility] [address]  [place], [place], [address] Phone: [phone]   Fax:  [phone]  Physical Therapy Treatment  Patient Details  Name: Katelyn Fisher MRN: [id number] Date of Birth: 12/04/1950 Referring Provider (PT): [name], MD   Encounter Date: 07/03/2019  PT End of Session - 07/03/19 1021    Visit Number  8    Number of Visits  13    Date for PT Re-Evaluation  07/16/19    Authorization Type  [employer] & [facility] [place] [facility]    PT Start Time  0930    PT Stop Time  1030    PT Time Calculation (min)  60 min    Activity Tolerance  Patient tolerated treatment well    Behavior During Therapy  [facility] for tasks assessed/performed       Past Medical History:  Diagnosis Date  . Anxiety   . Arthritis    back  . Asthma   . Benign paroxysmal positional vertigo 11/01/2012  . Cancer ([place]) 12/22/2014   endometrial  . Depression   . Endometrial cancer ([place]) 2015  . Family history of breast cancer   . Family history of colon cancer   . Family history of ovarian cancer   . Family history of prostate cancer   . GERD (gastroesophageal reflux disease)   . History of colonic diverticulitis   . Hx of colonic polyps   . Hyperlipidemia   . Hypertension   . 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  . Low back pain radiating to right leg 08/18/2012  . Obesity   . Osteopenia 09/2017   T score -1.2 FRAX 3.2% / 0.3%  . Personal history of chemotherapy 2015  . Radiation 12/17/14-01/03/15   vaginal brachytherapy  . Thyroid disease 06/01/2013    Past Surgical History:  Procedure Laterality Date  . ABDOMINAL HYSTERECTOMY  06/23/14   [facility], TRH/BSO  . CHOLECYSTECTOMY  1991  . DILATION AND CURETTAGE OF UTERUS    . IR REMOVAL TUN ACCESS W/ PORT W/O FL MOD SED  04/30/2017  . TUBAL LIGATION      There were no vitals filed for this visit.  Subjective Assessment - 07/03/19 0931    Subjective  Not much new today. Denies soreness after  last session. Feels better with heat and e-stim.    Pertinent History  thyroid disease, hx of endometrial CA treated with chemo, osteopenia, LBP HTN, HLD, GERD, BPPV, asthma    Diagnostic tests  05/11/19 lumbar xray: Mild-to-moderate multilevel lumbar spine DDD, worse at L4-L5 and L5-S1. Grade 1 anterolisthesis of L4 upon L5    Patient Stated Goals  get rid of pain    Currently in Pain?  Yes    Pain Score  2     Pain Location  Back    Pain Orientation  Right;Lower    Pain Descriptors / Indicators  Aching    Pain Type  Acute pain                       [facility] Adult PT Treatment/Exercise - 07/03/19 0001      Self-Care   Self-Care  Other Self-Care Comments    Other Self-Care Comments   edu, demonstration, and practice of self-STM to R QL with ball and pool noodle to tolerance      Lumbar Exercises: Stretches   Other Lumbar Stretch Exercise  R QL stretch in L sidelying 2x30"   PT providing slight OP  Lumbar Exercises: Aerobic   Nustep  L4 x 6 min (LEs)      Knee/Hip Exercises: Supine   Bridges with Clamshell  Strengthening;Both;1 set;10 reps   green TB above knees   Other Supine Knee/Hip Exercises  supine clam with green TB x15   cues for slower speed and increased control     Knee/Hip Exercises: Sidelying   Clams  2x10 with red TB    cues for positioning; limited ROM on R     Moist Heat Therapy   Number Minutes Moist Heat  15 Minutes    Moist Heat Location  Lumbar Spine      Electrical Stimulation   Electrical Stimulation Location  B lumbar paraspinals    Electrical Stimulation Action  IFC    Electrical Stimulation Parameters  80-150hz ; output to tolerance; 15 min    Electrical Stimulation Goals  Pain      Manual Therapy   Manual Therapy  Soft tissue mobilization;Myofascial release    Manual therapy comments  prone    Soft tissue mobilization  STM to R proximal and lateral glutes, piriformis, QL- soft tissue restriction and triger pts evident today     Myofascial Release  manual TPR to R piriformis and QL- tenderness reported               PT Short Term Goals - 06/23/19 1218      PT SHORT TERM GOAL #1   Title  Patient to be independent with initial HEP.    Time  3    Period  Weeks    Status  Achieved    Target Date  06/25/19        PT Long Term Goals - 07/03/19 1024      PT LONG TERM GOAL #1   Title  Patient to be independent with advanced HEP.    Time  6    Period  Weeks    Status  On-going      PT LONG TERM GOAL #2   Title  Patient to demonstrate B LE strength >=4+/5.    Time  6    Period  Weeks    Status  On-going      PT LONG TERM GOAL #3   Title  Patient to demonstrate lumbar AROM WFL and with pain <3/10.    Time  6    Period  Weeks    Status  On-going      PT LONG TERM GOAL #4   Title  Patient to score <14 sec on TUG testing with LRAD to decrease risk of falls.    Period  Weeks    Status  On-going      PT LONG TERM GOAL #5   Title  Patient to report tolerance of 1 hour on her feet without pain limiting.    Time  6    Period  Weeks    Status  On-going            Plan - 07/03/19 1021    Clinical Impression Statement  Patient without new complaints this AM. Reports 70% improvement in her back thus far and is demonstrating much improved gait speed. Continues to report a decrease in baseline pain levels with each subsequent appointment, however, still reporting most pain with bending and "moving around a lot." Worked on addressing remaining pain in R QL muscle with self-STM using ball and pool noodle. Provided cues for proper positioning for max benefit. Continued with sidelying QL stretch  with gentle OP which patient tolerated well. Remainder of session focused on progressive hip strengthening with corrective cue provided as needed.  Patient continues to demonstrate decreased strength and ROM on R LE vs L. Patient demonstrated soft tissue restriction in R proximal glutes, piriformis, and QL with  manual therapy but reported relief. Ended session with moist heat and e-stim for pain relief. NO complaints at end of session. Patient progressing towards goals.    Comorbidities  thyroid disease, hx of endometrial CA treated with chemo, osteopenia, LBP HTN, HLD, GERD, BPPV, asthma    Examination-Activity Limitations  Sit;Bathing;Bed Mobility;Sleep;Bend;Squat;Stairs;Caring for Others;Carry;Stand;Toileting;Dressing;Transfers;Hygiene/Grooming;Lift;Locomotion Level;Reach Overhead    Examination-Participation Restrictions  Church;Cleaning;Shop;Community Activity;Volunteer;Driving;Yard Work;Interpersonal Relationship;Laundry;Meal Prep    Stability/Clinical Decision Making  Evolving/Moderate complexity    Rehab Potential  Good    PT Frequency  2x / week    PT Duration  6 weeks    PT Treatment/Interventions  ADLs/Self Care Home Management;Cryotherapy;Electrical Stimulation;Moist Heat;Balance training;Therapeutic exercise;Therapeutic activities;Functional mobility training;Stair training;Gait training;Ultrasound;Neuromuscular re-education;Patient/family education;Orthotic Fit/Training;Manual techniques;Vasopneumatic Device;Taping;Energy conservation;Dry needling;Passive range of motion    PT Next Visit Plan  progress gentle LE stretching and lumbopelvic ROM    Consulted and Agree with Plan of Care  Patient       Patient will benefit from skilled therapeutic intervention in order to improve the following deficits and impairments:  Abnormal gait, Decreased activity tolerance, Decreased strength, Increased fascial restricitons, Pain, Increased muscle spasms, Difficulty walking, Decreased balance, Decreased range of motion, Improper body mechanics, Postural dysfunction, Impaired flexibility  Visit Diagnosis: Acute right-sided low back pain with right-sided sciatica  Difficulty in walking, not elsewhere classified  Other abnormalities of gait and mobility  Other symptoms and signs involving the  musculoskeletal system     Problem List Patient Active Problem List   Diagnosis Date Noted  . Subacromial impingement of left shoulder 12/30/2018  . Moderate persistent asthma without complication [id number]  . Current use of beta blocker 06/30/2018  . Allergic rhinitis 06/30/2018  . Acute bronchitis due to other specified organisms 06/30/2018  . History of endometrial cancer 02/03/2018  . Screening Mammogram 11/05/2017  . 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  . Type 2 diabetes mellitus with diabetic neuropathy, without long-term current use of insulin ([place]) 10/25/2016  . Microcytosis 09/28/2016  . Mild intermittent asthma without complication 0000000  . Port catheter in place 11/15/2015  . Chemotherapy induced neutropenia ([place]) 11/29/2014  . Family history of breast cancer   . Family history of ovarian cancer   . Family history of prostate cancer   . Family history of colon cancer   . Morbid obesity with body mass index of 40.0-44.9 in adult [facility]) 11/09/2014  . Chemotherapy-induced peripheral neuropathy ([place]) 09/22/2014  . Endometrial cancer ([place]) 05/20/2014  . 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  . Thyroid disease 06/01/2013  . Low back pain radiating to right leg 08/18/2012  . Anemia 11/07/2011  . Hyperlipidemia 06/15/2011  . Diabetes mellitus type 2 in obese ([place]) 03/12/2011  . Acute pain of right knee 01/27/2011  . Obstructive sleep apnea 11/15/2010  . Osteoarthritis 11/15/2010  . Gastroesophageal reflux disease without esophagitis 09/14/2010  . DIVERTICULITIS, HX OF 09/14/2010  . POSTMENOPAUSAL STATUS 10/01/2008  . COLONIC POLYPS, HX OF 03/10/2008  . Essential hypertension 08/13/2006     Janene Harvey, PT, DPT 07/03/19 10:36 AM   [facility] [address]  [place], [place], [address] Phone: [phone]   Fax:  [phone]  Name: Katelyn Fisher  [place] MRN: [id number] Date  of Birth: Jul 15, 1951

## 2019-07-07 ENCOUNTER — Ambulatory Visit: Payer: Medicare PPO | Admitting: Physical Therapy

## 2019-07-07 ENCOUNTER — Other Ambulatory Visit: Payer: Self-pay

## 2019-07-07 ENCOUNTER — Encounter: Payer: Self-pay | Admitting: Physical Therapy

## 2019-07-07 DIAGNOSIS — R262 Difficulty in walking, not elsewhere classified: Secondary | ICD-10-CM | POA: Diagnosis not present

## 2019-07-07 DIAGNOSIS — R29898 Other symptoms and signs involving the musculoskeletal system: Secondary | ICD-10-CM | POA: Diagnosis not present

## 2019-07-07 DIAGNOSIS — R2689 Other abnormalities of gait and mobility: Secondary | ICD-10-CM

## 2019-07-07 DIAGNOSIS — M5441 Lumbago with sciatica, right side: Secondary | ICD-10-CM

## 2019-07-07 NOTE — Therapy (Signed)
[facility] [address]  [place], [place], [address] Phone: [phone]   Fax:  [phone]  Physical Therapy Treatment  Patient Details  Name: Katelyn Fisher MRN: [id number] Date of Birth: 1951-03-09 Referring Provider (PT): [name], MD   Encounter Date: 07/07/2019  PT End of Session - 07/07/19 1014    Visit Number  9    Number of Visits  13    Date for PT Re-Evaluation  07/16/19    Authorization Type  [employer]    PT Start Time  [phone]    PT Stop Time  1022    PT Time Calculation (min)  54 min    Activity Tolerance  Patient tolerated treatment well;Patient limited by pain    Behavior During Therapy  [facility] for tasks assessed/performed       Past Medical History:  Diagnosis Date  . Anxiety   . Arthritis    back  . Asthma   . Benign paroxysmal positional vertigo 11/01/2012  . Cancer ([place]) 12/22/2014   endometrial  . Depression   . Endometrial cancer ([place]) 2015  . Family history of breast cancer   . Family history of colon cancer   . Family history of ovarian cancer   . Family history of prostate cancer   . GERD (gastroesophageal reflux disease)   . History of colonic diverticulitis   . Hx of colonic polyps   . Hyperlipidemia   . Hypertension   . 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  . Low back pain radiating to right leg 08/18/2012  . Obesity   . Osteopenia 09/2017   T score -1.2 FRAX 3.2% / 0.3%  . Personal history of chemotherapy 2015  . Radiation 12/17/14-01/03/15   vaginal brachytherapy  . Thyroid disease 06/01/2013    Past Surgical History:  Procedure Laterality Date  . ABDOMINAL HYSTERECTOMY  06/23/14   [facility], TRH/BSO  . CHOLECYSTECTOMY  1991  . DILATION AND CURETTAGE OF UTERUS    . IR REMOVAL TUN ACCESS W/ PORT W/O FL MOD SED  04/30/2017  . TUBAL LIGATION      There were no vitals filed for this visit.  Subjective Assessment - 07/07/19 0929    Subjective  Has had a busy  morning- just got back from [employer] for christmas shopping. Still having trouble bending forward d/t pain.    Pertinent History  thyroid disease, hx of endometrial CA treated with chemo, osteopenia, LBP HTN, HLD, GERD, BPPV, asthma    Diagnostic tests  05/11/19 lumbar xray: Mild-to-moderate multilevel lumbar spine DDD, worse at L4-L5 and L5-S1. Grade 1 anterolisthesis of L4 upon L5    Patient Stated Goals  get rid of pain    Currently in Pain?  Yes    Pain Score  2     Pain Location  Back    Pain Orientation  Right;Lower    Pain Descriptors / Indicators  Aching    Pain Type  Acute pain         [facility] PT Assessment - 07/07/19 0001      Palpation   Spinal mobility  normal jt mobility but TTP over L2-L5 with central PAs; no TTP over R unilateral PAs                   [facility] Adult PT Treatment/Exercise - 07/07/19 0001      Lumbar Exercises: Aerobic   Nustep  L4 x 6 min (LEs)  Lumbar Exercises: Seated   Long Arc Quad on Chair  AROM;Strengthening;Both;10 reps    LAQ on Chair Limitations  sitting on green pball   more difficulty on L LE   LAQ on Ball Limitations  R/L paloff press with red TB sitting on green pball x10 each way    Hip Flexion on Ball  Strengthening;Both;20 reps    Hip Flexion on Ball Limitations  alt hip flexion on green pball   cues for core contraction    Sit to Stand Limitations  sitting on green pball with B UEs D2 flexion with yellow medball x10    Other Seated Lumbar Exercises  sitting anterior/posterior pelvic tilts on green pball x10    Other Seated Lumbar Exercises  sitting lumbar flexion stretch 5x10" to tolerance    pain increased to 4/10      Lumbar Exercises: Sidelying   Other Sidelying Lumbar Exercises  open book stretch x10 each side to tol   cues to slow down     Lumbar Exercises: Prone   Other Prone Lumbar Exercises  prone on elbows 10x3" to tolerance      Moist Heat Therapy   Number Minutes Moist Heat  10 Minutes    Moist Heat  Location  Lumbar Spine      Electrical Stimulation   Electrical Stimulation Location  B lumbar paraspinals    Electrical Stimulation Action  IFC    Electrical Stimulation Parameters  80-150 hz; output 19 to tolerance; 10 min    Electrical Stimulation Goals  Pain               PT Short Term Goals - 06/23/19 1218      PT SHORT TERM GOAL #1   Title  Patient to be independent with initial HEP.    Time  3    Period  Weeks    Status  Achieved    [place] Date  06/25/19        PT Long Term Goals - 07/03/19 1024      PT LONG TERM GOAL #1   Title  Patient to be independent with advanced HEP.    Time  6    Period  Weeks    Status  On-going      PT LONG TERM GOAL #2   Title  Patient to demonstrate B LE strength >=4+/5.    Time  6    Period  Weeks    Status  On-going      PT LONG TERM GOAL #3   Title  Patient to demonstrate lumbar AROM WFL and with pain <3/10.    Time  6    Period  Weeks    Status  On-going      PT LONG TERM GOAL #4   Title  Patient to score <14 sec on TUG testing with LRAD to decrease risk of falls.    Period  Weeks    Status  On-going      PT LONG TERM GOAL #5   Title  Patient to report tolerance of 1 hour on her feet without pain limiting.    Time  6    Period  Weeks    Status  On-going            Plan - 07/07/19 1018    Clinical Impression Statement  Patient without new complaints this AM. Notes that she continues to have back pain with forward flexion. Assessed sitting lumbar flexion with patient reporting increase in  pain up to 4/10, but tolerable. Able to perform prone on elbows with good ROM and no complaints. Assessed lumbar joint mobility with patient reporting tenderness with central PAs over L2-5. Continued to work on progressive core stability exercises sitting on physio ball with patient demonstrating difficulty maintaining balance when lifting L LE, indicated R LE weakness. Ended session with moist heat and e-stim to LB for pain  relief. No complaints at end of session. Plan to continue per POC.    Comorbidities  thyroid disease, hx of endometrial CA treated with chemo, osteopenia, LBP HTN, HLD, GERD, BPPV, asthma    Examination-Activity Limitations  Sit;Bathing;Bed Mobility;Sleep;Bend;Squat;Stairs;Caring for Others;Carry;Stand;Toileting;Dressing;Transfers;Hygiene/Grooming;Lift;Locomotion Level;Reach Overhead    Examination-Participation Restrictions  Church;Cleaning;Shop;Community Activity;Volunteer;Driving;Yard Work;Interpersonal Relationship;Laundry;Meal Prep    Stability/Clinical Decision Making  Evolving/Moderate complexity    Rehab Potential  Good    PT Frequency  2x / week    PT Duration  6 weeks    PT Treatment/Interventions  ADLs/Self Care Home Management;Cryotherapy;Electrical Stimulation;Moist Heat;Balance training;Therapeutic exercise;Therapeutic activities;Functional mobility training;Stair training;Gait training;Ultrasound;Neuromuscular re-education;Patient/family education;Orthotic Fit/Training;Manual techniques;Vasopneumatic Device;Taping;Energy conservation;Dry needling;Passive range of motion    PT Next Visit Plan  progress gentle LE stretching and lumbopelvic ROM    Consulted and Agree with Plan of Care  Patient       Patient will benefit from skilled therapeutic intervention in order to improve the following deficits and impairments:  Abnormal gait, Decreased activity tolerance, Decreased strength, Increased fascial restricitons, Pain, Increased muscle spasms, Difficulty walking, Decreased balance, Decreased range of motion, Improper body mechanics, Postural dysfunction, Impaired flexibility  Visit Diagnosis: Acute right-sided low back pain with right-sided sciatica  Difficulty in walking, not elsewhere classified  Other abnormalities of gait and mobility  Other symptoms and signs involving the musculoskeletal system     Problem List Patient Active Problem List   Diagnosis Date Noted  .  Subacromial impingement of left shoulder 12/30/2018  . Moderate persistent asthma without complication [id number]  . Current use of beta blocker 06/30/2018  . Allergic rhinitis 06/30/2018  . Acute bronchitis due to other specified organisms 06/30/2018  . History of endometrial cancer 02/03/2018  . Screening Mammogram 11/05/2017  . 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  . Type 2 diabetes mellitus with diabetic neuropathy, without long-term current use of insulin ([place]) 10/25/2016  . Microcytosis 09/28/2016  . Mild intermittent asthma without complication 0000000  . Port catheter in place 11/15/2015  . Chemotherapy induced neutropenia ([place]) 11/29/2014  . Family history of breast cancer   . Family history of ovarian cancer   . Family history of prostate cancer   . Family history of colon cancer   . Morbid obesity with body mass index of 40.0-44.9 in adult [facility]) 11/09/2014  . Chemotherapy-induced peripheral neuropathy ([place]) 09/22/2014  . Endometrial cancer ([place]) 05/20/2014  . 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  . Thyroid disease 06/01/2013  . Low back pain radiating to right leg 08/18/2012  . Anemia 11/07/2011  . Hyperlipidemia 06/15/2011  . Diabetes mellitus type 2 in obese ([place]) 03/12/2011  . Acute pain of right knee 01/27/2011  . Obstructive sleep apnea 11/15/2010  . Osteoarthritis 11/15/2010  . Gastroesophageal reflux disease without esophagitis 09/14/2010  . DIVERTICULITIS, HX OF 09/14/2010  . POSTMENOPAUSAL STATUS 10/01/2008  . COLONIC POLYPS, HX OF 03/10/2008  . Essential hypertension 08/13/2006      Janene Harvey, PT, DPT 07/07/19 10:26 AM   [facility] [address]  [place], [place], [address] Phone: [phone]  Fax:  [phone]  Name: Katelyn Fisher MRN: [id number] Date of Birth: 01-Jul-1951

## 2019-07-09 ENCOUNTER — Telehealth: Payer: Self-pay | Admitting: Family Medicine

## 2019-07-09 NOTE — Telephone Encounter (Signed)
OK to refill. Please make sure she is maximizing her Symbicort 2 puffs twice a day and using nasal steroid spray and saline nasal sprays. Thank you Please have her call with worsening symptoms. We can do a televisit if needed. Thank you

## 2019-07-09 NOTE — Telephone Encounter (Signed)
PT call to get refill of benzontate 100mg  [facility] [address]. [place] ph [phone]

## 2019-07-09 NOTE — Telephone Encounter (Addendum)
Patient is having coughing and wheezing at night and would like benzonatate called in. Please advise

## 2019-07-10 ENCOUNTER — Encounter: Payer: Self-pay | Admitting: Physical Therapy

## 2019-07-10 ENCOUNTER — Ambulatory Visit: Payer: Medicare PPO | Admitting: Physical Therapy

## 2019-07-10 ENCOUNTER — Other Ambulatory Visit: Payer: Self-pay

## 2019-07-10 DIAGNOSIS — R2689 Other abnormalities of gait and mobility: Secondary | ICD-10-CM

## 2019-07-10 DIAGNOSIS — R262 Difficulty in walking, not elsewhere classified: Secondary | ICD-10-CM | POA: Diagnosis not present

## 2019-07-10 DIAGNOSIS — R29898 Other symptoms and signs involving the musculoskeletal system: Secondary | ICD-10-CM | POA: Diagnosis not present

## 2019-07-10 DIAGNOSIS — M5441 Lumbago with sciatica, right side: Secondary | ICD-10-CM | POA: Diagnosis not present

## 2019-07-10 NOTE — Therapy (Signed)
[facility] [address]  [place], [place], [address] Phone: [phone]   Fax:  [phone]  Physical Therapy Progress Note  Patient Details  Name: Katelyn Fisher MRN: [id number] Date of Birth: Sep 12, 1950 Referring Provider (PT): [name], MD  Progress Note Reporting Period 06/04/19 to 07/10/19  See note below for Objective Data and Assessment of Progress/Goals.     Encounter Date: 07/10/2019  PT End of Session - 07/10/19 1224    Visit Number  10    Number of Visits  16    Date for PT Re-Evaluation  08/21/19    Authorization Type  [employer] [place]    PT Start Time  [phone]    PT Stop Time  1027    PT Time Calculation (min)  56 min    Activity Tolerance  Patient tolerated treatment well;Patient limited by pain    Behavior During Therapy  [facility] for tasks assessed/performed       Past Medical History:  Diagnosis Date  . Anxiety   . Arthritis    back  . Asthma   . Benign paroxysmal positional vertigo 11/01/2012  . Cancer ([place]) 12/22/2014   endometrial  . Depression   . Endometrial cancer ([place]) 2015  . Family history of breast cancer   . Family history of colon cancer   . Family history of ovarian cancer   . Family history of prostate cancer   . GERD (gastroesophageal reflux disease)   . History of colonic diverticulitis   . Hx of colonic polyps   . Hyperlipidemia   . Hypertension   . 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  . Low back pain radiating to right leg 08/18/2012  . Obesity   . Osteopenia 09/2017   T score -1.2 FRAX 3.2% / 0.3%  . Personal history of chemotherapy 2015  . Radiation 12/17/14-01/03/15   vaginal brachytherapy  . Thyroid disease 06/01/2013    Past Surgical History:  Procedure Laterality Date  . ABDOMINAL HYSTERECTOMY  06/23/14   [facility], TRH/BSO  . CHOLECYSTECTOMY  1991  . DILATION AND CURETTAGE OF UTERUS    . IR REMOVAL TUN ACCESS W/ PORT W/O FL MOD SED  04/30/2017  . TUBAL  LIGATION      There were no vitals filed for this visit.  Subjective Assessment - 07/10/19 0932    Subjective  Doing pretty well. Reporting 85% improvement in LB since initial eval. Feels that she has more strength in her back, walking better, and has less pain. Still has pain when bending forward and turning.    Pertinent History  thyroid disease, hx of endometrial CA treated with chemo, osteopenia, LBP HTN, HLD, GERD, BPPV, asthma    Diagnostic tests  05/11/19 lumbar xray: Mild-to-moderate multilevel lumbar spine DDD, worse at L4-L5 and L5-S1. Grade 1 anterolisthesis of L4 upon L5    Patient Stated Goals  get rid of pain    Currently in Pain?  Yes    Pain Score  1     Pain Location  Back    Pain Orientation  Right;Lower    Pain Descriptors / Indicators  Aching    Pain Type  Acute pain         [facility] PT Assessment - 07/10/19 0001      Assessment   Medical Diagnosis  LBP radiating to R leg    Referring Provider (PT)  [name], MD    Onset Date/Surgical Date  04/21/19  Observation/Other Assessments   Focus on Therapeutic Outcomes (FOTO)   Lumbar: 38 (62% limited, 51% predicted)      AROM   AROM Assessment Site  Lumbar    Lumbar Flexion  distal shin   7/10 pain'   Lumbar Extension  mildly limited   3/10 pain   Lumbar - Right Side Bend  jt line    Lumbar - Left Side Bend  distal thigh   6/10 pain   Lumbar - Right Rotation  WNL    Lumbar - Left Rotation  WNL   6/10 pain     Strength   Right Hip Flexion  4+/5    Right Hip ABduction  4+/5    Right Hip ADduction  4+/5    Left Hip Flexion  5/5    Left Hip ABduction  4+/5    Left Hip ADduction  4+/5    Right Knee Flexion  5/5    Right Knee Extension  5/5    Left Knee Flexion  5/5    Left Knee Extension  5/5    Right Ankle Dorsiflexion  4+/5    Right Ankle Plantar Flexion  4+/5    Left Ankle Dorsiflexion  4+/5    Left Ankle Plantar Flexion  4+/5      High Level Balance   High Level Balance Comments  TUG:  10.15 seconds                   [facility] Adult PT Treatment/Exercise - 07/10/19 0001      Lumbar Exercises: Aerobic   Nustep  L4 x 6 min (LEs)      Moist Heat Therapy   Number Minutes Moist Heat  15 Minutes    Moist Heat Location  Lumbar Spine      Electrical Stimulation   Electrical Stimulation Location  B lumbar paraspinals    Electrical Stimulation Action  IFC    Electrical Stimulation Parameters  80-150 hz; output to tolerance; 15 min    Electrical Stimulation Goals  Pain      Manual Therapy   Manual Therapy  Soft tissue mobilization;Myofascial release    Manual therapy comments  prone    Soft tissue mobilization  STM to R proximal glutes, piriformis, QL, lumbar paraspinals- soft tissue restriction and tenderness    Myofascial Release  manual TPR to R piriformis, proximal glutes- tenderness and relief reported             PT Education - 07/10/19 1224    Education Details  discussion on objective progress with PT and remaining goals    Person(s) Educated  Patient    Methods  Explanation;Demonstration;Tactile cues;Verbal cues;Handout    Comprehension  Verbalized understanding;Returned demonstration       PT Short Term Goals - 07/10/19 0935      PT SHORT TERM GOAL #1   Title  Patient to be independent with initial HEP.    Time  3    Period  Weeks    Status  Achieved    [place] Date  06/25/19        PT Long Term Goals - 07/10/19 0935      PT LONG TERM GOAL #1   Title  Patient to be independent with advanced HEP.    Time  6    Period  Weeks    Status  Partially Met   met for current   [place] Date  08/21/19      PT LONG TERM GOAL #  2   Title  Patient to demonstrate B LE strength >=4+/5.    Time  6    Period  Weeks    Status  Achieved      PT LONG TERM GOAL #3   Title  Patient to demonstrate lumbar AROM WFL and with pain <3/10.    Time  6    Period  Weeks    Status  Partially Met   improved in flexion, extension, and R/L rotation as welll  as pain levels   [place] Date  08/21/19      PT LONG TERM GOAL #4   Title  Patient to score <14 sec on TUG testing with LRAD to decrease risk of falls.    Period  Weeks    Status  Achieved      PT LONG TERM GOAL #5   Title  Patient to report tolerance of 1 hour on her feet without pain limiting.    Time  6    Period  Weeks    Status  On-going   reporting 10 min before increase in pain causing her to sit down   [place] Date  08/21/19            Plan - 07/10/19 1228    Clinical Impression Statement  Patient reporting 85% improvement in LB since initial eval. Feels that she has improved in strength, ability to walk, and having less pain. Still has pain when bending forward and turning. Patient has now met strength goal- has demonstrated improvements in B hip flexion, adduction, knee flexion and extension, and R ankle dorsiflexion/plantarflexion strength. Lumbar AROM is improved in flexion, extension, and R/L rotation and patient now also reporting decreased pain levels with lumbar ROM. TUG goal has been met- now no longer scoring within fall risk category. Still reporting 10 min of standing before increase in pain, causing her to take a rest break. Overall, patient is demonstrating steady progress. Most limitation remains in lumbar ROM and pain with functional activities. Ended session with STM to R sided posterior chain musculature and e-stim and moist heat per patient's request. No complaints at end of session. Patient would benefit from continued skilled PT services 1x/week for 6 weeks to address remaining goals.    Comorbidities  thyroid disease, hx of endometrial CA treated with chemo, osteopenia, LBP HTN, HLD, GERD, BPPV, asthma    Examination-Activity Limitations  Sit;Bathing;Bed Mobility;Sleep;Bend;Squat;Stairs;Caring for Others;Carry;Stand;Toileting;Dressing;Transfers;Hygiene/Grooming;Lift;Locomotion Level;Reach Overhead    Examination-Participation Restrictions   Church;Cleaning;Shop;Community Activity;Volunteer;Driving;Yard Work;Interpersonal Relationship;Laundry;Meal Prep    Stability/Clinical Decision Making  Evolving/Moderate complexity    Rehab Potential  Good    PT Frequency  1x / week    PT Duration  6 weeks    PT Treatment/Interventions  ADLs/Self Care Home Management;Cryotherapy;Electrical Stimulation;Moist Heat;Balance training;Therapeutic exercise;Therapeutic activities;Functional mobility training;Stair training;Gait training;Ultrasound;Neuromuscular re-education;Patient/family education;Orthotic Fit/Training;Manual techniques;Vasopneumatic Device;Taping;Energy conservation;Dry needling;Passive range of motion    PT Next Visit Plan  progress gentle LE stretching and lumbopelvic ROM    Consulted and Agree with Plan of Care  Patient       Patient will benefit from skilled therapeutic intervention in order to improve the following deficits and impairments:  Abnormal gait, Decreased activity tolerance, Decreased strength, Increased fascial restricitons, Pain, Increased muscle spasms, Difficulty walking, Decreased balance, Decreased range of motion, Improper body mechanics, Postural dysfunction, Impaired flexibility  Visit Diagnosis: Acute right-sided low back pain with right-sided sciatica  Difficulty in walking, not elsewhere classified  Other abnormalities of gait and mobility  Other symptoms and signs  involving the musculoskeletal system     Problem List Patient Active Problem List   Diagnosis Date Noted  . Subacromial impingement of left shoulder 12/30/2018  . Moderate persistent asthma without complication 46/50/3546  . Current use of beta blocker 06/30/2018  . Allergic rhinitis 06/30/2018  . Acute bronchitis due to other specified organisms 06/30/2018  . History of endometrial cancer 02/03/2018  . Screening Mammogram 11/05/2017  . 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  . Type 2 diabetes mellitus with  diabetic neuropathy, without long-term current use of insulin ([place]) 10/25/2016  . Microcytosis 09/28/2016  . Mild intermittent asthma without complication 56/81/2751  . Port catheter in place 11/15/2015  . Chemotherapy induced neutropenia ([place]) 11/29/2014  . Family history of breast cancer   . Family history of ovarian cancer   . Family history of prostate cancer   . Family history of colon cancer   . Morbid obesity with body mass index of 40.0-44.9 in adult [facility]) 11/09/2014  . Chemotherapy-induced peripheral neuropathy ([place]) 09/22/2014  . Endometrial cancer ([place]) 05/20/2014  . 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  . Thyroid disease 06/01/2013  . Low back pain radiating to right leg 08/18/2012  . Anemia 11/07/2011  . Hyperlipidemia 06/15/2011  . Diabetes mellitus type 2 in obese ([place]) 03/12/2011  . Acute pain of right knee 01/27/2011  . Obstructive sleep apnea 11/15/2010  . Osteoarthritis 11/15/2010  . Gastroesophageal reflux disease without esophagitis 09/14/2010  . DIVERTICULITIS, HX OF 09/14/2010  . POSTMENOPAUSAL STATUS 10/01/2008  . COLONIC POLYPS, HX OF 03/10/2008  . Essential hypertension 08/13/2006     Janene Harvey, PT, DPT 07/10/19 12:35 PM   [facility] [address]  [address] [place], [place], [address] Phone: [phone]   Fax:  [phone]  Name: Katelyn Fisher MRN: [id number] Date of Birth: 1950-09-23

## 2019-07-10 NOTE — Telephone Encounter (Signed)
PT called to see if her medication was sent into the pharmacy. Advise of Anne's previous note. PT reports cough as primary symptom.

## 2019-07-13 MED ORDER — BENZONATATE 100 MG PO CAPS: 100.0000 mg | ORAL_CAPSULE | Freq: Three times a day (TID) | ORAL | 0 refills | Status: DC | PRN

## 2019-07-13 NOTE — Telephone Encounter (Signed)
Benzonatate called in to [facility] pharmacy. Left message for patient

## 2019-07-22 ENCOUNTER — Encounter: Payer: Self-pay | Admitting: Physical Therapy

## 2019-07-22 ENCOUNTER — Ambulatory Visit: Payer: Medicare PPO | Admitting: Physical Therapy

## 2019-07-22 ENCOUNTER — Other Ambulatory Visit: Payer: Self-pay

## 2019-07-22 DIAGNOSIS — R29898 Other symptoms and signs involving the musculoskeletal system: Secondary | ICD-10-CM | POA: Diagnosis not present

## 2019-07-22 DIAGNOSIS — M5441 Lumbago with sciatica, right side: Secondary | ICD-10-CM | POA: Diagnosis not present

## 2019-07-22 DIAGNOSIS — R262 Difficulty in walking, not elsewhere classified: Secondary | ICD-10-CM

## 2019-07-22 DIAGNOSIS — R2689 Other abnormalities of gait and mobility: Secondary | ICD-10-CM

## 2019-07-22 NOTE — Therapy (Signed)
[facility] [address]  [place], [place], [address] Phone: [phone]   Fax:  [phone]  Physical Therapy Treatment  Patient Details  Name: Katelyn Fisher MRN: [id number] Date of Birth: [birth year] Referring Provider (PT): [name], MD   Encounter Date: 07/22/2019  PT End of Session - 07/22/19 1212    Visit Number  11    Number of Visits  16    Date for PT Re-Evaluation  08/21/19    Authorization Type  [employer]    PT Start Time  [phone]    PT Stop Time  1017    PT Time Calculation (min)  44 min    Activity Tolerance  Patient tolerated treatment well    Behavior During Therapy  [facility] for tasks assessed/performed       Past Medical History:  Diagnosis Date  . Anxiety   . Arthritis    back  . Asthma   . Benign paroxysmal positional vertigo 11/01/2012  . Cancer ([place]) 12/22/2014   endometrial  . Depression   . Endometrial cancer ([place]) 2015  . Family history of breast cancer   . Family history of colon cancer   . Family history of ovarian cancer   . Family history of prostate cancer   . GERD (gastroesophageal reflux disease)   . History of colonic diverticulitis   . Hx of colonic polyps   . Hyperlipidemia   . Hypertension   . 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  . Low back pain radiating to right leg 08/18/2012  . Obesity   . Osteopenia 09/2017   T score -1.2 FRAX 3.2% / 0.3%  . Personal history of chemotherapy 2015  . Radiation 12/17/14-01/03/15   vaginal brachytherapy  . Thyroid disease 06/01/2013    Past Surgical History:  Procedure Laterality Date  . ABDOMINAL HYSTERECTOMY  06/23/14   [facility], TRH/BSO  . CHOLECYSTECTOMY  1991  . DILATION AND CURETTAGE OF UTERUS    . IR REMOVAL TUN ACCESS W/ PORT W/O FL MOD SED  04/30/2017  . TUBAL LIGATION      There were no vitals filed for this visit.  Subjective Assessment - 07/22/19 0934    Subjective  Feels that her back is flared up from the  holiday. Was going a lot of cooking and moving. Also dropping things and having to bend down to pick them up.    Pertinent History  thyroid disease, hx of endometrial CA treated with chemo, osteopenia, LBP HTN, HLD, GERD, BPPV, asthma    Diagnostic tests  05/11/19 lumbar xray: Mild-to-moderate multilevel lumbar spine DDD, worse at L4-L5 and L5-S1. Grade 1 anterolisthesis of L4 upon L5    Patient Stated Goals  get rid of pain    Currently in Pain?  Yes    Pain Score  4     Pain Location  Back    Pain Orientation  Right;Lower    Pain Descriptors / Indicators  Aching    Pain Type  Acute pain                       [facility] Adult PT Treatment/Exercise - 07/22/19 0001      Lumbar Exercises: Stretches   Single Knee to Chest Stretch  Right;Left;30 seconds;2 reps    Single Knee to Chest Stretch Limitations  with strap    Piriformis Stretch  Right;1 rep;30 seconds    Piriformis Stretch Limitations  KTOS with strap  Figure 4 Stretch  1 rep;30 seconds;With overpressure    Figure 4 Stretch Limitations  each LE to tolerance      Lumbar Exercises: Aerobic   Nustep  L4 x 6 min (LEs)             PT Education - 07/22/19 1210    Education Details  explanation, demonstration, and practice using personal TENS unit as well as edu on precautions/contraindications, electrode placement, wear time of TENS unit    Person(s) Educated  Patient    Methods  Explanation;Demonstration;Verbal cues;Handout    Comprehension  Verbalized understanding;Returned demonstration       PT Short Term Goals - 07/10/19 0935      PT SHORT TERM GOAL #1   Title  Patient to be independent with initial HEP.    Time  3    Period  Weeks    Status  Achieved    Target Date  06/25/19        PT Long Term Goals - 07/10/19 0935      PT LONG TERM GOAL #1   Title  Patient to be independent with advanced HEP.    Time  6    Period  Weeks    Status  Partially Met   met for current   Target Date  08/21/19       PT LONG TERM GOAL #2   Title  Patient to demonstrate B LE strength >=4+/5.    Time  6    Period  Weeks    Status  Achieved      PT LONG TERM GOAL #3   Title  Patient to demonstrate lumbar AROM WFL and with pain <3/10.    Time  6    Period  Weeks    Status  Partially Met   improved in flexion, extension, and R/L rotation as welll as pain levels   Target Date  08/21/19      PT LONG TERM GOAL #4   Title  Patient to score <14 sec on TUG testing with LRAD to decrease risk of falls.    Period  Weeks    Status  Achieved      PT LONG TERM GOAL #5   Title  Patient to report tolerance of 1 hour on her feet without pain limiting.    Time  6    Period  Weeks    Status  On-going   reporting 10 min before increase in pain causing her to sit down   Target Date  08/21/19            Plan - 07/22/19 1212    Clinical Impression Statement  Patient reporting flare up of back pain since the recent holiday. Notes that she did a lot of cooking during this time and that bending forward to pick something off the ground is still quite painful for her. Focused of gentle LE stretching at beginning of session for pain relief. Spent remaining time this session educating patient on and trialing use of personal TENS unit. All of patient's questions we answered and advised patient that if any questions/concerns arise, to bring them up in future sessions. Patient agreeable and without complaints at end of session.    Comorbidities  thyroid disease, hx of endometrial CA treated with chemo, osteopenia, LBP HTN, HLD, GERD, BPPV, asthma    Examination-Activity Limitations  Sit;Bathing;Bed Mobility;Sleep;Bend;Squat;Stairs;Caring for Others;Carry;Stand;Toileting;Dressing;Transfers;Hygiene/Grooming;Lift;Locomotion Level;Reach Overhead    Examination-Participation Restrictions  Church;Cleaning;Shop;Community Activity;Volunteer;Driving;Yard Work;Interpersonal Relationship;Laundry;Meal Prep  Stability/Clinical  Decision Making  Evolving/Moderate complexity    Rehab Potential  Good    PT Frequency  1x / week    PT Duration  6 weeks    PT Treatment/Interventions  ADLs/Self Care Home Management;Cryotherapy;Electrical Stimulation;Moist Heat;Balance training;Therapeutic exercise;Therapeutic activities;Functional mobility training;Stair training;Gait training;Ultrasound;Neuromuscular re-education;Patient/family education;Orthotic Fit/Training;Manual techniques;Vasopneumatic Device;Taping;Energy conservation;Dry needling;Passive range of motion    PT Next Visit Plan  progress gentle LE stretching and lumbopelvic ROM    Consulted and Agree with Plan of Care  Patient       Patient will benefit from skilled therapeutic intervention in order to improve the following deficits and impairments:  Abnormal gait, Decreased activity tolerance, Decreased strength, Increased fascial restricitons, Pain, Increased muscle spasms, Difficulty walking, Decreased balance, Decreased range of motion, Improper body mechanics, Postural dysfunction, Impaired flexibility  Visit Diagnosis: Acute right-sided low back pain with right-sided sciatica  Difficulty in walking, not elsewhere classified  Other abnormalities of gait and mobility  Other symptoms and signs involving the musculoskeletal system     Problem List Patient Active Problem List   Diagnosis Date Noted  . Subacromial impingement of left shoulder 12/30/2018  . Moderate persistent asthma without complication 08/13/2409  . Current use of beta blocker 06/30/2018  . Allergic rhinitis 06/30/2018  . Acute bronchitis due to other specified organisms 06/30/2018  . History of endometrial cancer 02/03/2018  . Screening Mammogram 11/05/2017  . 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  . Type 2 diabetes mellitus with diabetic neuropathy, without long-term current use of insulin ([place]) 10/25/2016  . Microcytosis 09/28/2016  . Mild intermittent asthma  without complication 46/43/1427  . Port catheter in place 11/15/2015  . Chemotherapy induced neutropenia ([place]) 11/29/2014  . Family history of breast cancer   . Family history of ovarian cancer   . Family history of prostate cancer   . Family history of colon cancer   . Morbid obesity with body mass index of 40.0-44.9 in adult [facility]) 11/09/2014  . Chemotherapy-induced peripheral neuropathy ([place]) 09/22/2014  . Endometrial cancer ([place]) 05/20/2014  . 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  . Thyroid disease 06/01/2013  . Low back pain radiating to right leg 08/18/2012  . Anemia 11/07/2011  . Hyperlipidemia 06/15/2011  . Diabetes mellitus type 2 in obese ([place]) 03/12/2011  . Acute pain of right knee 01/27/2011  . Obstructive sleep apnea 11/15/2010  . Osteoarthritis 11/15/2010  . Gastroesophageal reflux disease without esophagitis 09/14/2010  . DIVERTICULITIS, HX OF 09/14/2010  . POSTMENOPAUSAL STATUS 10/01/2008  . COLONIC POLYPS, HX OF 03/10/2008  . Essential hypertension 08/13/2006     Janene Harvey, PT, DPT 07/22/19 12:15 PM   [facility] [address]  [place], [place], [address] Phone: [phone]   Fax:  [phone]  Name: NIELLE DUFORD MRN: [id number] Date of Birth: 1951-07-18

## 2019-07-29 ENCOUNTER — Ambulatory Visit: Payer: Medicare Other | Attending: Family Medicine | Admitting: Physical Therapy

## 2019-07-29 ENCOUNTER — Other Ambulatory Visit: Payer: Self-pay

## 2019-07-29 ENCOUNTER — Encounter: Payer: Self-pay | Admitting: Physical Therapy

## 2019-07-29 DIAGNOSIS — R262 Difficulty in walking, not elsewhere classified: Secondary | ICD-10-CM | POA: Diagnosis not present

## 2019-07-29 DIAGNOSIS — R2689 Other abnormalities of gait and mobility: Secondary | ICD-10-CM | POA: Diagnosis not present

## 2019-07-29 DIAGNOSIS — R29898 Other symptoms and signs involving the musculoskeletal system: Secondary | ICD-10-CM

## 2019-07-29 DIAGNOSIS — M5441 Lumbago with sciatica, right side: Secondary | ICD-10-CM | POA: Diagnosis not present

## 2019-07-29 NOTE — Therapy (Signed)
[facility] [address]  [place], [place], [address] Phone: [phone]   Fax:  [phone]  Physical Therapy Treatment  Patient Details  Name: Katelyn Fisher MRN: [id number] Date of Birth: [birth year] Referring Provider (PT): [name], MD   Encounter Date: 07/29/2019  PT End of Session - 07/29/19 1207    Visit Number  12    Number of Visits  16    Date for PT Re-Evaluation  08/21/19    Authorization Type  [employer] & [place]    PT Start Time  [phone]    PT Stop Time  1011    PT Time Calculation (min)  42 min    Activity Tolerance  Patient tolerated treatment well    Behavior During Therapy  [facility] for tasks assessed/performed       Past Medical History:  Diagnosis Date  . Anxiety   . Arthritis    back  . Asthma   . Benign paroxysmal positional vertigo 11/01/2012  . Cancer ([place]) 12/22/2014   endometrial  . Depression   . Endometrial cancer ([place]) 2015  . Family history of breast cancer   . Family history of colon cancer   . Family history of ovarian cancer   . Family history of prostate cancer   . GERD (gastroesophageal reflux disease)   . History of colonic diverticulitis   . Hx of colonic polyps   . Hyperlipidemia   . Hypertension   . 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  . Low back pain radiating to right leg 08/18/2012  . Obesity   . Osteopenia 09/2017   T score -1.2 FRAX 3.2% / 0.3%  . Personal history of chemotherapy 2015  . Radiation 12/17/14-01/03/15   vaginal brachytherapy  . Thyroid disease 06/01/2013    Past Surgical History:  Procedure Laterality Date  . ABDOMINAL HYSTERECTOMY  06/23/14   [facility], TRH/BSO  . CHOLECYSTECTOMY  1991  . DILATION AND CURETTAGE OF UTERUS    . IR REMOVAL TUN ACCESS W/ PORT W/O FL MOD SED  04/30/2017  . TUBAL LIGATION      There were no vitals filed for this visit.  Subjective Assessment - 07/29/19 0930    Subjective  Been feeling pretty good. Has used her TENS  unit 3 times since last session. Feels a little better. Has been working on getting her steps in recently- has tried to get in 5,000 steps and 50 squats each day. Reports 92% improvement since initial eval. Bending forward to pick up an off the ground is a little better.    Pertinent History  thyroid disease, hx of endometrial CA treated with chemo, osteopenia, LBP HTN, HLD, GERD, BPPV, asthma    Diagnostic tests  05/11/19 lumbar xray: Mild-to-moderate multilevel lumbar spine DDD, worse at L4-L5 and L5-S1. Grade 1 anterolisthesis of L4 upon L5    Patient Stated Goals  get rid of pain    Currently in Pain?  Yes    Pain Score  2     Pain Location  Back    Pain Orientation  Right;Lower    Pain Descriptors / Indicators  Aching    Pain Type  Acute pain         [facility] PT Assessment - 07/29/19 0001      AROM   AROM Assessment Site  Lumbar    Lumbar Flexion  ankles   3/10 pain   Lumbar Extension  WFL   3/10 pain   Lumbar -  Right Side Bend  jt line    Lumbar - Left Side Bend  distal thigh   5/10 pain   Lumbar - Right Rotation  WNL    Lumbar - Left Rotation  mildly limited   5/10 pain                  [facility] Adult PT Treatment/Exercise - 07/29/19 0001      Therapeutic Activites    Therapeutic Activities  Lifting    Lifting  education, demonstration, and practice liftting beanbag and 10lb box from stool on the floor; practice using golfer's lift to pick up bean bag from table and mat table   cues provided to keep object close to body and spine in neut     Lumbar Exercises: Aerobic   Nustep  L4 x 6 min (LEs)             PT Education - 07/29/19 0959    Education Details  discussion, explanation, and demonstrating on safe lifting and maintenance of posure and body mechanics with ADLs; discussion on objective progress and remaining limitations    Person(s) Educated  Patient    Methods  Explanation;Demonstration;Tactile cues;Verbal cues;Handout    Comprehension   Verbalized understanding;Returned demonstration       PT Short Term Goals - 07/29/19 0935      PT SHORT TERM GOAL #1   Title  Patient to be independent with initial HEP.    Time  3    Period  Weeks    Status  Achieved    Target Date  06/25/19        PT Long Term Goals - 07/29/19 0935      PT LONG TERM GOAL #1   Title  Patient to be independent with advanced HEP.    Time  6    Period  Weeks    Status  Partially Met   met for current     PT LONG TERM GOAL #2   Title  Patient to demonstrate B LE strength >=4+/5.    Time  6    Period  Weeks    Status  Achieved      PT LONG TERM GOAL #3   Title  Patient to demonstrate lumbar AROM WFL and with pain <3/10.    Time  6    Period  Weeks    Status  Partially Met   improved in flexion, extension, and R/L rotation as welll as pain levels     PT LONG TERM GOAL #4   Title  Patient to score <14 sec on TUG testing with LRAD to decrease risk of falls.    Period  Weeks    Status  Achieved      PT LONG TERM GOAL #5   Title  Patient to report tolerance of 1 hour on her feet without pain limiting.    Time  6    Period  Weeks    Status  On-going   reporting 10-15 min of standing and 30 min of walking before onset of pain           Plan - 07/29/19 1208    Clinical Impression Statement  Patient reporting 92% improvement in LB since initial eval. Notes that bending forward to pick up an object from the ground has gotten better. Has also increased her activity- performing 50 squats and trying to get 5,000 steps per day- did advise patient to split activities into increments throughout the day  rather than performing 50 squats at one time like she is doing now. Patient has made progress towards functional activity tolerance goal- able to stand 10-15 min and walk for 30 min before increase in LBP. Lumbar AROM has also improved in flexion and extension, and patient now with decreased pain levels with ROM. Remaining part of session focused  on postural and body mechanics education, followed by practice and education on proper lifting mechanics. Patient reported understanding and with no complaints at end of session. Patient continues to demonstrate progress towards goals. Would continue to benefit from skilled PT services to return to PLOF.    Comorbidities  thyroid disease, hx of endometrial CA treated with chemo, osteopenia, LBP HTN, HLD, GERD, BPPV, asthma    Examination-Activity Limitations  Sit;Bathing;Bed Mobility;Sleep;Bend;Squat;Stairs;Caring for Others;Carry;Stand;Toileting;Dressing;Transfers;Hygiene/Grooming;Lift;Locomotion Level;Reach Overhead    Examination-Participation Restrictions  Church;Cleaning;Shop;Community Activity;Volunteer;Driving;Yard Work;Interpersonal Relationship;Laundry;Meal Prep    Stability/Clinical Decision Making  Evolving/Moderate complexity    Rehab Potential  Good    PT Frequency  1x / week    PT Duration  6 weeks    PT Treatment/Interventions  ADLs/Self Care Home Management;Cryotherapy;Electrical Stimulation;Moist Heat;Balance training;Therapeutic exercise;Therapeutic activities;Functional mobility training;Stair training;Gait training;Ultrasound;Neuromuscular re-education;Patient/family education;Orthotic Fit/Training;Manual techniques;Vasopneumatic Device;Taping;Energy conservation;Dry needling;Passive range of motion    PT Next Visit Plan  progress gentle LE stretching and lumbopelvic ROM    Consulted and Agree with Plan of Care  Patient       Patient will benefit from skilled therapeutic intervention in order to improve the following deficits and impairments:  Abnormal gait, Decreased activity tolerance, Decreased strength, Increased fascial restricitons, Pain, Increased muscle spasms, Difficulty walking, Decreased balance, Decreased range of motion, Improper body mechanics, Postural dysfunction, Impaired flexibility  Visit Diagnosis: Acute right-sided low back pain with right-sided  sciatica  Difficulty in walking, not elsewhere classified  Other abnormalities of gait and mobility  Other symptoms and signs involving the musculoskeletal system     Problem List Patient Active Problem List   Diagnosis Date Noted  . Subacromial impingement of left shoulder 12/30/2018  . Moderate persistent asthma without complication 66/59/9357  . Current use of beta blocker 06/30/2018  . Allergic rhinitis 06/30/2018  . Acute bronchitis due to other specified organisms 06/30/2018  . History of endometrial cancer 02/03/2018  . Screening Mammogram 11/05/2017  . 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  . Type 2 diabetes mellitus with diabetic neuropathy, without long-term current use of insulin ([place]) 10/25/2016  . Microcytosis 09/28/2016  . Mild intermittent asthma without complication 01/77/9390  . Port catheter in place 11/15/2015  . Chemotherapy induced neutropenia ([place]) 11/29/2014  . Family history of breast cancer   . Family history of ovarian cancer   . Family history of prostate cancer   . Family history of colon cancer   . Morbid obesity with body mass index of 40.0-44.9 in adult [facility]) 11/09/2014  . Chemotherapy-induced peripheral neuropathy ([place]) 09/22/2014  . Endometrial cancer ([place]) 05/20/2014  . 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  . Thyroid disease 06/01/2013  . Low back pain radiating to right leg 08/18/2012  . Anemia 11/07/2011  . Hyperlipidemia 06/15/2011  . Diabetes mellitus type 2 in obese ([place]) 03/12/2011  . Acute pain of right knee 01/27/2011  . Obstructive sleep apnea 11/15/2010  . Osteoarthritis 11/15/2010  . Gastroesophageal reflux disease without esophagitis 09/14/2010  . DIVERTICULITIS, HX OF 09/14/2010  . POSTMENOPAUSAL STATUS 10/01/2008  . COLONIC POLYPS, HX OF 03/10/2008  . Essential hypertension 08/13/2006     Janene Harvey, PT, DPT 07/29/19 12:14 PM  [facility] [address]  [place], [place], [address] Phone: [phone]   Fax:  [phone]  Name: Katelyn Fisher MRN: [id number] Date of Birth: 11/15/1950

## 2019-07-29 NOTE — Patient Instructions (Signed)

## 2019-08-03 ENCOUNTER — Other Ambulatory Visit: Payer: Self-pay

## 2019-08-03 ENCOUNTER — Ambulatory Visit (INDEPENDENT_AMBULATORY_CARE_PROVIDER_SITE_OTHER): Payer: Medicare Other | Admitting: Family Medicine

## 2019-08-03 ENCOUNTER — Encounter: Payer: Self-pay | Admitting: Family Medicine

## 2019-08-03 DIAGNOSIS — M79604 Pain in right leg: Secondary | ICD-10-CM

## 2019-08-03 DIAGNOSIS — M7542 Impingement syndrome of left shoulder: Secondary | ICD-10-CM

## 2019-08-03 DIAGNOSIS — M545 Low back pain: Secondary | ICD-10-CM | POA: Diagnosis not present

## 2019-08-03 NOTE — Assessment & Plan Note (Signed)
Has been doing well with physical therapy.  Pain still intermittently occurring. -Continue physical therapy. -Counseled on home exercise therapy and supportive care. -Could consider MRI for consideration of epidurals.

## 2019-08-03 NOTE — Progress Notes (Signed)
Katelyn Fisher - 69 y.o. female MRN [id number]  Date of birth: 1950/10/05  SUBJECTIVE:  Including CC & ROS.  Chief Complaint  Patient presents with  . Follow-up    follow up for right-sided low back and left shoulder    Katelyn Fisher is a 69 y.o. female that is following up for her low back pain and left shoulder pain.  The pain is improved since the previous appointment.  She has been going through physical therapy and that seems to improve her pain.  She still experiences pain in the lower back when she is bending over trying to reach up.  They have given her modalities to avoid these inciting triggers.  She is not receiving physical therapy on the shoulder and it only intermittently hurts from time to time.  Feels like she has gotten better overall..    Review of Systems See HPI   HISTORY: Past Medical, Surgical, Social, and Family History Reviewed & Updated per EMR.   Pertinent Historical Findings include:  Past Medical History:  Diagnosis Date  . Anxiety   . Arthritis    back  . Asthma   . Benign paroxysmal positional vertigo 11/01/2012  . Cancer ([place]) 12/22/2014   endometrial  . Depression   . Endometrial cancer ([place]) 2015  . Family history of breast cancer   . Family history of colon cancer   . Family history of ovarian cancer   . Family history of prostate cancer   . GERD (gastroesophageal reflux disease)   . History of colonic diverticulitis   . Hx of colonic polyps   . Hyperlipidemia   . Hypertension   . 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  . Low back pain radiating to right leg 08/18/2012  . Obesity   . Osteopenia 09/2017   T score -1.2 FRAX 3.2% / 0.3%  . Personal history of chemotherapy 2015  . Radiation 12/17/14-01/03/15   vaginal brachytherapy  . Thyroid disease 06/01/2013    Past Surgical History:  Procedure Laterality Date  . ABDOMINAL HYSTERECTOMY  06/23/14   [facility], TRH/BSO  . CHOLECYSTECTOMY  1991  . DILATION AND CURETTAGE OF UTERUS    . IR  REMOVAL TUN ACCESS W/ PORT W/O FL MOD SED  04/30/2017  . TUBAL LIGATION      Allergies  Allergen Reactions  . Shellfish Allergy Hives, Shortness Of Breath and Swelling  . Oxycodone Swelling    Facial swelling and tightness  . Penicillins Swelling    Rash with welps  . Iodinated Diagnostic Agents Rash  . Sulfa Antibiotics Rash    Family History  Problem Relation Age of Onset  . Heart disease Maternal Aunt        x 3 -2 brothers  . Hypertension Maternal Aunt   . Breast cancer Maternal Aunt        maternal half; dx in her 91s  . Cancer Maternal Aunt        cervical  . Ovarian cancer Maternal Aunt        dx in her 81s  . Prostate cancer Father 47  . Diabetes Father   . Cancer Father        lung cancer, asbestos exposure  . Heart disease Brother   . Diabetes Brother   . Diabetes Mother   . Lupus Mother   . Heart disease Brother   . Hyperlipidemia Sister   . Heart disease Brother   . Diabetes Brother   . Colon polyps Brother   .  Heart disease Brother   . Diabetes Brother   . Colon polyps Brother   . Colon cancer Maternal Grandmother        dx in her 33s  . Leukemia Maternal Uncle 21  . Cancer Paternal Aunt        NOS- breast   . Prostate cancer Paternal Uncle        dx in his 51s  . Breast cancer Other        dx in her 85s     Social History   Socioeconomic History  . Marital status: Married    Spouse name: Not on file  . Number of children: 3  . Years of education: Not on file  . Highest education level: Not on file  Occupational History  . Occupation: retired Network engineer  Tobacco Use  . Smoking status: Never Smoker  . Smokeless tobacco: Never Used  Substance and Sexual Activity  . Alcohol use: No    Alcohol/week: 0.0 standard drinks  . Drug use: No  . Sexual activity: Yes    Partners: Male    Comment: 1st intercourse- 25, partners- 92, married- 17 yrs   Other Topics Concern  . Not on file  Social History Narrative   Married- 39 years   Never  Smoked   Alcohol use-no   Drug use-no   Occupation: housewife   Caffeine use/day:  None   Does Patient Exercise:  yes   Social Determinants of Health   Financial Resource Strain:   . Difficulty of Paying Living Expenses: Not on file  Food Insecurity:   . Worried About Charity fundraiser in the Last Year: Not on file  . Ran Out of Food in the Last Year: Not on file  Transportation Needs:   . Lack of Transportation (Medical): Not on file  . Lack of Transportation (Non-Medical): Not on file  Physical Activity:   . Days of Exercise per Week: Not on file  . Minutes of Exercise per Session: Not on file  Stress:   . Feeling of Stress : Not on file  Social Connections:   . Frequency of Communication with Friends and Family: Not on file  . Frequency of Social Gatherings with Friends and Family: Not on file  . Attends Religious Services: Not on file  . Active Member of Clubs or Organizations: Not on file  . Attends Archivist Meetings: Not on file  . Marital Status: Not on file  Intimate Partner Violence:   . Fear of Current or Ex-Partner: Not on file  . Emotionally Abused: Not on file  . Physically Abused: Not on file  . Sexually Abused: Not on file     PHYSICAL EXAM:  VS: BP 126/84   Pulse 84   Ht 5\' 3"  (1.6 m)   Wt 213 lb (96.6 kg)   BMI 37.73 kg/m  Physical Exam Gen: NAD, alert, cooperative with exam, well-appearing ENT: normal lips, normal nasal mucosa,  Eye: normal EOM, normal conjunctiva and lids Skin: no rashes, no areas of induration  Neuro: normal tone, normal sensation to touch Psych:  normal insight, alert and oriented MSK:  Back: Limited flexion secondary to pain. Normal strength resistance with hip flexion and knee flexion extension. Negative straight leg raise. Left shoulder: Normal active flexion and abduction. Normal internal and external rotation. Neurovascularly intact     ASSESSMENT & PLAN:   Low back pain radiating to right  leg Has been doing well with physical therapy.  Pain still intermittently occurring. -Continue physical therapy. -Counseled on home exercise therapy and supportive care. -Could consider MRI for consideration of epidurals.  Subacromial impingement of left shoulder Has been doing well.  Only intermittently mildly hurts. -Counseled on home exercise therapy and supportive care. -Could consider injection or physical therapy.

## 2019-08-03 NOTE — Assessment & Plan Note (Signed)
Has been doing well.  Only intermittently mildly hurts. -Counseled on home exercise therapy and supportive care. -Could consider injection or physical therapy.

## 2019-08-03 NOTE — Patient Instructions (Signed)
Good to see you Good luck with the stocks Please continue physical therapy   Please send me a message in MyChart with any questions or updates.  Please see me back in 6 weeks.   --Dr. Raeford Razor

## 2019-08-04 ENCOUNTER — Ambulatory Visit: Payer: Medicare Other | Admitting: Physical Therapy

## 2019-08-04 ENCOUNTER — Encounter: Payer: Self-pay | Admitting: Physical Therapy

## 2019-08-04 DIAGNOSIS — M5441 Lumbago with sciatica, right side: Secondary | ICD-10-CM | POA: Diagnosis not present

## 2019-08-04 DIAGNOSIS — R2689 Other abnormalities of gait and mobility: Secondary | ICD-10-CM | POA: Diagnosis not present

## 2019-08-04 DIAGNOSIS — R262 Difficulty in walking, not elsewhere classified: Secondary | ICD-10-CM

## 2019-08-04 DIAGNOSIS — R29898 Other symptoms and signs involving the musculoskeletal system: Secondary | ICD-10-CM

## 2019-08-04 NOTE — Therapy (Signed)
[facility] [address]  [place], [place], [address] Phone: [phone]   Fax:  [phone]  Physical Therapy Treatment  Patient Details  Name: Katelyn Fisher MRN: [id number] Date of Birth: 1951-07-23 Referring Provider (PT): [name], MD   Encounter Date: 08/04/2019  PT End of Session - 08/04/19 1013    Visit Number  13    Number of Visits  16    Date for PT Re-Evaluation  08/21/19    Authorization Type  [employer]    PT Start Time  [phone]    PT Stop Time  1009    PT Time Calculation (min)  38 min    Activity Tolerance  Patient tolerated treatment well    Behavior During Therapy  [facility] for tasks assessed/performed       Past Medical History:  Diagnosis Date  . Anxiety   . Arthritis    back  . Asthma   . Benign paroxysmal positional vertigo 11/01/2012  . Cancer ([place]) 12/22/2014   endometrial  . Depression   . Endometrial cancer ([place]) 2015  . Family history of breast cancer   . Family history of colon cancer   . Family history of ovarian cancer   . Family history of prostate cancer   . GERD (gastroesophageal reflux disease)   . History of colonic diverticulitis   . Hx of colonic polyps   . Hyperlipidemia   . Hypertension   . 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  . Low back pain radiating to right leg 08/18/2012  . Obesity   . Osteopenia 09/2017   T score -1.2 FRAX 3.2% / 0.3%  . Personal history of chemotherapy 2015  . Radiation 12/17/14-01/03/15   vaginal brachytherapy  . Thyroid disease 06/01/2013    Past Surgical History:  Procedure Laterality Date  . ABDOMINAL HYSTERECTOMY  06/23/14   [facility], TRH/BSO  . CHOLECYSTECTOMY  1991  . DILATION AND CURETTAGE OF UTERUS    . IR REMOVAL TUN ACCESS W/ PORT W/O FL MOD SED  04/30/2017  . TUBAL LIGATION      There were no vitals filed for this visit.  Subjective Assessment - 08/04/19 0933    Subjective  Reporting that MD was pleased with her  progress thus far. Would like her to continue with PT. Has had a productive morning today.    Pertinent History  thyroid disease, hx of endometrial CA treated with chemo, osteopenia, LBP HTN, HLD, GERD, BPPV, asthma    Diagnostic tests  05/11/19 lumbar xray: Mild-to-moderate multilevel lumbar spine DDD, worse at L4-L5 and L5-S1. Grade 1 anterolisthesis of L4 upon L5    Patient Stated Goals  get rid of pain    Currently in Pain?  Yes    Pain Score  2     Pain Location  Back    Pain Orientation  Right;Lower    Pain Descriptors / Indicators  Aching    Pain Type  Acute pain                       [facility] Adult PT Treatment/Exercise - 08/04/19 0001      Lumbar Exercises: Stretches   Other Lumbar Stretch Exercise  R & L QL stretch in doorway 30" each   cues for staggered stance     Lumbar Exercises: Aerobic   Nustep  L4 x 6 min (LEs)      Lumbar Exercises: Standing   Wall  Slides  10 reps    Wall Slides Limitations  red loop above knees   cues to maintain back flat against wall     Lumbar Exercises: Supine   Pelvic Tilt  10 reps    Pelvic Tilt Limitations  manual cues for proper movement    Other Supine Lumbar Exercises  alt heel tap in hooklying x10 each side   cues to slow and increase control   Other Supine Lumbar Exercises  overhead yellow medball lift x10   manual cues to maintain posterior pelvic tilt     Lumbar Exercises: Sidelying   Other Sidelying Lumbar Exercises  open book stretch x10 each side to tol   cues to rotate torso     Knee/Hip Exercises: Standing   Functional Squat  1 set;10 reps    Functional Squat Limitations  at counter   cues to stand tall     Knee/Hip Exercises: Sidelying   Clams  2x15 with red TB              PT Education - 08/04/19 1013    Education Details  update and consolidation of HEP    Person(s) Educated  Patient    Methods  Explanation;Demonstration;Tactile cues;Verbal cues;Handout    Comprehension  Verbalized  understanding;Returned demonstration       PT Short Term Goals - 07/29/19 0935      PT SHORT TERM GOAL #1   Title  Patient to be independent with initial HEP.    Time  3    Period  Weeks    Status  Achieved    Target Date  06/25/19        PT Long Term Goals - 07/29/19 0935      PT LONG TERM GOAL #1   Title  Patient to be independent with advanced HEP.    Time  6    Period  Weeks    Status  Partially Met   met for current     PT LONG TERM GOAL #2   Title  Patient to demonstrate B LE strength >=4+/5.    Time  6    Period  Weeks    Status  Achieved      PT LONG TERM GOAL #3   Title  Patient to demonstrate lumbar AROM WFL and with pain <3/10.    Time  6    Period  Weeks    Status  Partially Met   improved in flexion, extension, and R/L rotation as welll as pain levels     PT LONG TERM GOAL #4   Title  Patient to score <14 sec on TUG testing with LRAD to decrease risk of falls.    Period  Weeks    Status  Achieved      PT LONG TERM GOAL #5   Title  Patient to report tolerance of 1 hour on her feet without pain limiting.    Time  6    Period  Weeks    Status  On-going   reporting 10-15 min of standing and 30 min of walking before onset of pain           Plan - 08/04/19 1013    Clinical Impression Statement  Patient reporting that her MD was pleased with her progress thus far and recommended that she continue with therapy. Patient performed gentle lumbopelvic ROM and LE strengthening this session. Able to perform clamshells with banded resistance and good form. Demonstrated a little difficulty maintaining  posterior pelvic tilt with core strengthening ther-ex, requiring manual cues to correct. Reported mild LBP when sitting up from mat ther-ex, but able to continue without complaints. Updated and consolidated HEP for max benefit- patient reported understanding. No further complaints at end of session. Patient progressing towards goals.    Comorbidities  thyroid  disease, hx of endometrial CA treated with chemo, osteopenia, LBP HTN, HLD, GERD, BPPV, asthma    Examination-Activity Limitations  Sit;Bathing;Bed Mobility;Sleep;Bend;Squat;Stairs;Caring for Others;Carry;Stand;Toileting;Dressing;Transfers;Hygiene/Grooming;Lift;Locomotion Level;Reach Overhead    Examination-Participation Restrictions  Church;Cleaning;Shop;Community Activity;Volunteer;Driving;Yard Work;Interpersonal Relationship;Laundry;Meal Prep    Stability/Clinical Decision Making  Evolving/Moderate complexity    Rehab Potential  Good    PT Frequency  1x / week    PT Duration  6 weeks    PT Treatment/Interventions  ADLs/Self Care Home Management;Cryotherapy;Electrical Stimulation;Moist Heat;Balance training;Therapeutic exercise;Therapeutic activities;Functional mobility training;Stair training;Gait training;Ultrasound;Neuromuscular re-education;Patient/family education;Orthotic Fit/Training;Manual techniques;Vasopneumatic Device;Taping;Energy conservation;Dry needling;Passive range of motion    PT Next Visit Plan  progress gentle LE stretching and lumbopelvic ROM    Consulted and Agree with Plan of Care  Patient       Patient will benefit from skilled therapeutic intervention in order to improve the following deficits and impairments:  Abnormal gait, Decreased activity tolerance, Decreased strength, Increased fascial restricitons, Pain, Increased muscle spasms, Difficulty walking, Decreased balance, Decreased range of motion, Improper body mechanics, Postural dysfunction, Impaired flexibility  Visit Diagnosis: Acute right-sided low back pain with right-sided sciatica  Difficulty in walking, not elsewhere classified  Other abnormalities of gait and mobility  Other symptoms and signs involving the musculoskeletal system     Problem List Patient Active Problem List   Diagnosis Date Noted  . Subacromial impingement of left shoulder 12/30/2018  . Moderate persistent asthma without  complication 20/25/4270  . Current use of beta blocker 06/30/2018  . Allergic rhinitis 06/30/2018  . Acute bronchitis due to other specified organisms 06/30/2018  . History of endometrial cancer 02/03/2018  . Screening Mammogram 11/05/2017  . 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  . Type 2 diabetes mellitus with diabetic neuropathy, without long-term current use of insulin ([place]) 10/25/2016  . Microcytosis 09/28/2016  . Mild intermittent asthma without complication 62/37/6283  . Port catheter in place 11/15/2015  . Chemotherapy induced neutropenia ([place]) 11/29/2014  . Family history of breast cancer   . Family history of ovarian cancer   . Family history of prostate cancer   . Family history of colon cancer   . Morbid obesity with body mass index of 40.0-44.9 in adult [facility]) 11/09/2014  . 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 Endometrial cancer ([place]) 05/20/2014  . 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  . Thyroid disease 06/01/2013  . Low back pain radiating to right leg 08/18/2012  . Anemia 11/07/2011  . Hyperlipidemia 06/15/2011  . Diabetes mellitus type 2 in obese ([place]) 03/12/2011  . Acute pain of right knee 01/27/2011  . Obstructive sleep apnea 11/15/2010  . Osteoarthritis 11/15/2010  . Gastroesophageal reflux disease without esophagitis 09/14/2010  . DIVERTICULITIS, HX OF 09/14/2010  . POSTMENOPAUSAL STATUS 10/01/2008  . COLONIC POLYPS, HX OF 03/10/2008  . Essential hypertension 08/13/2006     Janene Harvey, PT, DPT 08/04/19 10:15 AM   [facility] [address]  [place], [place], [address] Phone: [phone]   Fax:  [phone]  Name: DELBERT VU MRN: [id number] Date of Birth: [birth year]

## 2019-08-05 ENCOUNTER — Encounter: Payer: Medicare PPO | Admitting: Physical Therapy

## 2019-08-09 ENCOUNTER — Other Ambulatory Visit: Payer: Self-pay | Admitting: Family Medicine

## 2019-08-09 DIAGNOSIS — B353 Tinea pedis: Secondary | ICD-10-CM

## 2019-08-10 ENCOUNTER — Other Ambulatory Visit: Payer: Self-pay | Admitting: *Deleted

## 2019-08-10 MED ORDER — ALBUTEROL SULFATE (2.5 MG/3ML) 0.083% IN NEBU: 2.5000 mg | INHALATION_SOLUTION | RESPIRATORY_TRACT | 2 refills | Status: DC | PRN

## 2019-08-12 ENCOUNTER — Encounter: Payer: Self-pay | Admitting: Physical Therapy

## 2019-08-12 ENCOUNTER — Other Ambulatory Visit: Payer: Self-pay

## 2019-08-12 ENCOUNTER — Ambulatory Visit: Payer: Medicare Other | Admitting: Physical Therapy

## 2019-08-12 DIAGNOSIS — R2689 Other abnormalities of gait and mobility: Secondary | ICD-10-CM | POA: Diagnosis not present

## 2019-08-12 DIAGNOSIS — R29898 Other symptoms and signs involving the musculoskeletal system: Secondary | ICD-10-CM

## 2019-08-12 DIAGNOSIS — M5441 Lumbago with sciatica, right side: Secondary | ICD-10-CM | POA: Diagnosis not present

## 2019-08-12 DIAGNOSIS — R262 Difficulty in walking, not elsewhere classified: Secondary | ICD-10-CM

## 2019-08-12 NOTE — Therapy (Signed)
[facility] [address]  [place], [place], [address] Phone: [phone]   Fax:  [phone]  Physical Therapy Treatment  Patient Details  Name: Katelyn Fisher MRN: [id number] Date of Birth: April 04, 1951 Referring Provider (PT): [name], MD   Encounter Date: 08/12/2019  PT End of Session - 08/12/19 1014    Visit Number  14    Number of Visits  16    Date for PT Re-Evaluation  08/21/19    Authorization Type  [employer]    PT Start Time  [phone]    PT Stop Time  1013    PT Time Calculation (min)  42 min    Activity Tolerance  Patient tolerated treatment well;Patient limited by pain    Behavior During Therapy  [facility] for tasks assessed/performed       Past Medical History:  Diagnosis Date  . Anxiety   . Arthritis    back  . Asthma   . Benign paroxysmal positional vertigo 11/01/2012  . Cancer ([place]) 12/22/2014   endometrial  . Depression   . Endometrial cancer ([place]) 2015  . Family history of breast cancer   . Family history of colon cancer   . Family history of ovarian cancer   . Family history of prostate cancer   . GERD (gastroesophageal reflux disease)   . History of colonic diverticulitis   . Hx of colonic polyps   . Hyperlipidemia   . Hypertension   . 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  . Low back pain radiating to right leg 08/18/2012  . Obesity   . Osteopenia 09/2017   T score -1.2 FRAX 3.2% / 0.3%  . Personal history of chemotherapy 2015  . Radiation 12/17/14-01/03/15   vaginal brachytherapy  . Thyroid disease 06/01/2013    Past Surgical History:  Procedure Laterality Date  . ABDOMINAL HYSTERECTOMY  06/23/14   [facility], TRH/BSO  . CHOLECYSTECTOMY  1991  . DILATION AND CURETTAGE OF UTERUS    . IR REMOVAL TUN ACCESS W/ PORT W/O FL MOD SED  04/30/2017  . TUBAL LIGATION      There were no vitals filed for this visit.  Subjective Assessment - 08/12/19 0934    Subjective  Not feeling too  bad. Back has been okay.    Pertinent History  thyroid disease, hx of endometrial CA treated with chemo, osteopenia, LBP HTN, HLD, GERD, BPPV, asthma    Diagnostic tests  05/11/19 lumbar xray: Mild-to-moderate multilevel lumbar spine DDD, worse at L4-L5 and L5-S1. Grade 1 anterolisthesis of L4 upon L5    Patient Stated Goals  get rid of pain    Currently in Pain?  Yes    Pain Score  2     Pain Location  Back    Pain Orientation  Right;Left    Pain Descriptors / Indicators  Aching    Pain Type  Acute pain                       [facility] Adult PT Treatment/Exercise - 08/12/19 0001      Lumbar Exercises: Stretches   Passive Hamstring Stretch  Right;1 rep;30 seconds    Passive Hamstring Stretch Limitations  supine with strap    Lower Trunk Rotation Limitations  x20 to tolerance    Other Lumbar Stretch Exercise  R/L sitting sidebody stretch 30"      Lumbar Exercises: Aerobic   Recumbent Bike  L1 x 62mn  discontinued d/t R knee pain     Knee/Hip Exercises: Standing   Hip Extension  Stengthening;Right;Left;1 set;10 reps;Knee bent    Extension Limitations  standing donkey kicks at counter top   manual cues to maintian knee bent   Functional Squat  1 set;10 reps    Functional Squat Limitations  at counter with yellow band above knees   cues to stick bottom back   Other Standing Knee Exercises  sidestepping with yellow band around ankles 2xlength of counter   cues to maintain toes facing forward     Knee/Hip Exercises: Sidelying   Hip ABduction  Strengthening;Right;Left;1 set;10 reps    Hip ABduction Limitations  cues for alignment    Hip ADduction  Strengthening;Right;Left;1 set;10 reps    Hip ADduction Limitations  opposite LE resting on bolster   cues to maintain knee straight   Clams  2x15 with red TB              PT Education - 08/12/19 1014    Education Details  update to HEP    Person(s) Educated  Patient    Methods  Explanation;Demonstration;Tactile  cues;Verbal cues;Handout    Comprehension  Verbalized understanding;Returned demonstration       PT Short Term Goals - 07/29/19 0935      PT SHORT TERM GOAL #1   Title  Patient to be independent with initial HEP.    Time  3    Period  Weeks    Status  Achieved    Target Date  06/25/19        PT Long Term Goals - 07/29/19 0935      PT LONG TERM GOAL #1   Title  Patient to be independent with advanced HEP.    Time  6    Period  Weeks    Status  Partially Met   met for current     PT LONG TERM GOAL #2   Title  Patient to demonstrate B LE strength >=4+/5.    Time  6    Period  Weeks    Status  Achieved      PT LONG TERM GOAL #3   Title  Patient to demonstrate lumbar AROM WFL and with pain <3/10.    Time  6    Period  Weeks    Status  Partially Met   improved in flexion, extension, and R/L rotation as welll as pain levels     PT LONG TERM GOAL #4   Title  Patient to score <14 sec on TUG testing with LRAD to decrease risk of falls.    Period  Weeks    Status  Achieved      PT LONG TERM GOAL #5   Title  Patient to report tolerance of 1 hour on her feet without pain limiting.    Time  6    Period  Weeks    Status  On-going   reporting 10-15 min of standing and 30 min of walking before onset of pain           Plan - 08/12/19 1014    Clinical Impression Statement  Patient without new complaints at beginning of session. Notes good tolerance for new HEP update made last session. Worked on standing LE strengthening ther-ex with intermittent cues to correct form. Patient required sitting rest breaks in between exercises d/t R knee pain today. Required manual and verbal cues to promote posterior weight shift with squats to avoid exacerbating knee  pain. Transitioned to mat ther-ex for relief of knee pain- patient with better tolerance for these exercises. Demonstrated L hip adductor weakness with sidelying adduction d/t limited ROM and difficulty. Thus, updated HEP with  this exercise. Patient reported understanding. Patient reporting improvement in pain levels with bending over to pick something off the ground and noting that she does not require TENs use quite as often as before. Patient progressing well. No complaints at end of session.    Comorbidities  thyroid disease, hx of endometrial CA treated with chemo, osteopenia, LBP HTN, HLD, GERD, BPPV, asthma    Examination-Activity Limitations  Sit;Bathing;Bed Mobility;Sleep;Bend;Squat;Stairs;Caring for Others;Carry;Stand;Toileting;Dressing;Transfers;Hygiene/Grooming;Lift;Locomotion Level;Reach Overhead    Examination-Participation Restrictions  Church;Cleaning;Shop;Community Activity;Volunteer;Driving;Yard Work;Interpersonal Relationship;Laundry;Meal Prep    Stability/Clinical Decision Making  Evolving/Moderate complexity    Rehab Potential  Good    PT Frequency  1x / week    PT Duration  6 weeks    PT Treatment/Interventions  ADLs/Self Care Home Management;Cryotherapy;Electrical Stimulation;Moist Heat;Balance training;Therapeutic exercise;Therapeutic activities;Functional mobility training;Stair training;Gait training;Ultrasound;Neuromuscular re-education;Patient/family education;Orthotic Fit/Training;Manual techniques;Vasopneumatic Device;Taping;Energy conservation;Dry needling;Passive range of motion    PT Next Visit Plan  progress gentle LE stretching and lumbopelvic ROM    Consulted and Agree with Plan of Care  Patient       Patient will benefit from skilled therapeutic intervention in order to improve the following deficits and impairments:  Abnormal gait, Decreased activity tolerance, Decreased strength, Increased fascial restricitons, Pain, Increased muscle spasms, Difficulty walking, Decreased balance, Decreased range of motion, Improper body mechanics, Postural dysfunction, Impaired flexibility  Visit Diagnosis: Acute right-sided low back pain with right-sided sciatica  Difficulty in walking, not  elsewhere classified  Other abnormalities of gait and mobility  Other symptoms and signs involving the musculoskeletal system     Problem List Patient Active Problem List   Diagnosis Date Noted  . Subacromial impingement of left shoulder 12/30/2018  . Moderate persistent asthma without complication 76/16/0737  . Current use of beta blocker 06/30/2018  . Allergic rhinitis 06/30/2018  . Acute bronchitis due to other specified organisms 06/30/2018  . History of endometrial cancer 02/03/2018  . Screening Mammogram 11/05/2017  . 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  . Type 2 diabetes mellitus with diabetic neuropathy, without long-term current use of insulin ([place]) 10/25/2016  . Microcytosis 09/28/2016  . Mild intermittent asthma without complication 10/62/6948  . Port catheter in place 11/15/2015  . Chemotherapy induced neutropenia ([place]) 11/29/2014  . Family history of breast cancer   . Family history of ovarian cancer   . Family history of prostate cancer   . Family history of colon cancer   . Morbid obesity with body mass index of 40.0-44.9 in adult [facility]) 11/09/2014  . Chemotherapy-induced peripheral neuropathy ([place]) 09/22/2014  . Endometrial cancer ([place]) 05/20/2014  . 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  . Thyroid disease 06/01/2013  . Low back pain radiating to right leg 08/18/2012  . Anemia 11/07/2011  . Hyperlipidemia 06/15/2011  . Diabetes mellitus type 2 in obese ([place]) 03/12/2011  . Acute pain of right knee 01/27/2011  . Obstructive sleep apnea 11/15/2010  . Osteoarthritis 11/15/2010  . Gastroesophageal reflux disease without esophagitis 09/14/2010  . DIVERTICULITIS, HX OF 09/14/2010  . POSTMENOPAUSAL STATUS 10/01/2008  . COLONIC POLYPS, HX OF 03/10/2008  . Essential hypertension 08/13/2006     Katelyn Fisher, PT, DPT 08/12/19 10:15 AM   [facility] [place], [place], [address] Phone: [phone]   Fax:  [phone]  Name: Katelyn Fisher MRN: [id number] Date of Birth: January 06, 1951

## 2019-08-14 ENCOUNTER — Encounter: Payer: Self-pay | Admitting: Family Medicine

## 2019-08-17 ENCOUNTER — Encounter: Payer: Self-pay | Admitting: Family Medicine

## 2019-08-17 ENCOUNTER — Telehealth: Payer: Self-pay

## 2019-08-17 NOTE — Telephone Encounter (Signed)
Monarrez, Donniesha, [place], FNP 13 minutes ago (9:04 AM)   Hi Dr. Nino Parsley all is well with you.  Do you recommend that I get the Covid 19 shot.  I read were people might have to use a EPIC Pen should check with their Allergy doctor before Cyndie Chime it.  What you think?   Please advise as Webb Silversmith is out of office

## 2019-08-17 NOTE — Telephone Encounter (Signed)
If she has not had allergic reactions to immunizations, she can get the Covid 19 vaccination.  She should have the vaccination

## 2019-08-17 NOTE — Telephone Encounter (Signed)
Pt informed of what dr b stated and will schedule to get the vaccine

## 2019-08-18 ENCOUNTER — Ambulatory Visit: Payer: Medicare Other | Admitting: Physical Therapy

## 2019-08-19 ENCOUNTER — Ambulatory Visit: Payer: Medicare Other | Admitting: Physical Therapy

## 2019-08-19 ENCOUNTER — Encounter: Payer: Self-pay | Admitting: Physical Therapy

## 2019-08-19 ENCOUNTER — Other Ambulatory Visit: Payer: Self-pay

## 2019-08-19 DIAGNOSIS — R2689 Other abnormalities of gait and mobility: Secondary | ICD-10-CM

## 2019-08-19 DIAGNOSIS — M5441 Lumbago with sciatica, right side: Secondary | ICD-10-CM

## 2019-08-19 DIAGNOSIS — R29898 Other symptoms and signs involving the musculoskeletal system: Secondary | ICD-10-CM

## 2019-08-19 DIAGNOSIS — R262 Difficulty in walking, not elsewhere classified: Secondary | ICD-10-CM | POA: Diagnosis not present

## 2019-08-19 NOTE — Therapy (Addendum)
[facility] [address]  [place], [place], [address] Phone: [phone]   Fax:  [phone]  Physical Therapy Treatment  Patient Details  Name: Katelyn Fisher MRN: [id number] Date of Birth: 1950/09/02 Referring Provider (PT): [name], MD    Progress Note Reporting Period 07/22/19 to 08/19/19  See note below for Objective Data and Assessment of Progress/Goals.     Encounter Date: 08/19/2019  PT End of Session - 08/19/19 1217    Visit Number  15    Number of Visits  16    Date for PT Re-Evaluation  08/21/19    Authorization Type  [employer] [place]    PT Start Time  [phone]    PT Stop Time  1012    PT Time Calculation (min)  43 min    Activity Tolerance  Patient tolerated treatment well;Patient limited by pain    Behavior During Therapy  [facility] for tasks assessed/performed       Past Medical History:  Diagnosis Date  . Anxiety   . Arthritis    back  . Asthma   . Benign paroxysmal positional vertigo 11/01/2012  . Cancer ([place]) 12/22/2014   endometrial  . Depression   . Endometrial cancer ([place]) 2015  . Family history of breast cancer   . Family history of colon cancer   . Family history of ovarian cancer   . Family history of prostate cancer   . GERD (gastroesophageal reflux disease)   . History of colonic diverticulitis   . Hx of colonic polyps   . Hyperlipidemia   . Hypertension   . 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  . Low back pain radiating to right leg 08/18/2012  . Obesity   . Osteopenia 09/2017   T score -1.2 FRAX 3.2% / 0.3%  . Personal history of chemotherapy 2015  . Radiation 12/17/14-01/03/15   vaginal brachytherapy  . Thyroid disease 06/01/2013    Past Surgical History:  Procedure Laterality Date  . ABDOMINAL HYSTERECTOMY  06/23/14   [facility], TRH/BSO  . CHOLECYSTECTOMY  1991  . DILATION AND CURETTAGE OF UTERUS    . IR REMOVAL TUN ACCESS W/ PORT W/O FL MOD SED  04/30/2017  . TUBAL  LIGATION      There were no vitals filed for this visit.  Subjective Assessment - 08/19/19 0930    Subjective  Feeling pretty good but is a little sleepy this AM. Back is a little more sore d/t the weather. Reporting 95% improvement in back since initial eval. Still having remaining pain with bending forward, twisting, and stirring.    Pertinent History  thyroid disease, hx of endometrial CA treated with chemo, osteopenia, LBP HTN, HLD, GERD, BPPV, asthma    Diagnostic tests  05/11/19 lumbar xray: Mild-to-moderate multilevel lumbar spine DDD, worse at L4-L5 and L5-S1. Grade 1 anterolisthesis of L4 upon L5    Patient Stated Goals  get rid of pain    Currently in Pain?  Yes    Pain Score  3     Pain Location  Back    Pain Orientation  Right    Pain Descriptors / Indicators  Aching    Pain Type  Acute pain         [facility] PT Assessment - 08/19/19 0001      Assessment   Medical Diagnosis  LBP radiating to R leg    Referring Provider (PT)  [name], MD    Onset Date/Surgical Date  04/21/19      Observation/Other Assessments   Focus on Therapeutic Outcomes (FOTO)   Lumbar: 36 (64% limited, 51% predicted)      AROM   AROM Assessment Site  Lumbar    Lumbar Flexion  ankles   4/10 pain   Lumbar Extension  WFL    Lumbar - Right Side Bend  jt line   4/10 pain   Lumbar - Left Side Bend  jt line   4/10 pain   Lumbar - Right Rotation  mildly limited   4/10 pain   Lumbar - Left Rotation  mildly limited   4/10 pain     Strength   Right Hip Flexion  4+/5    Right Hip ABduction  4+/5    Right Hip ADduction  4+/5    Left Hip Flexion  4+/5    Left Hip ABduction  4+/5    Left Hip ADduction  4+/5    Right Knee Flexion  4+/5    Right Knee Extension  4+/5    Left Knee Flexion  4+/5    Left Knee Extension  4+/5    Right Ankle Dorsiflexion  5/5    Right Ankle Plantar Flexion  4+/5    Left Ankle Dorsiflexion  4+/5    Left Ankle Plantar Flexion  4+/5                    [facility] Adult PT Treatment/Exercise - 08/19/19 0001      Lumbar Exercises: Aerobic   Nustep  L4 x 6 min (LEs)      Lumbar Exercises: Supine   Bridge with clamshell  10 reps   with red TB above knees     Knee/Hip Exercises: Standing   Hip Abduction  Stengthening;Right;Left;1 set;10 reps;Knee straight    Abduction Limitations  with yellow TB; at counter    Hip Extension  Stengthening;Right;Left;1 set;10 reps;Knee straight    Extension Limitations  with yellow TB; at counter             PT Education - 08/19/19 1216    Education Details  discussion on objective progress and remaining impairments; update and review of HEP    Person(s) Educated  Patient    Methods  Explanation;Demonstration;Tactile cues;Verbal cues;Handout    Comprehension  Verbalized understanding;Returned demonstration       PT Short Term Goals - 08/19/19 0934      PT SHORT TERM GOAL #1   Title  Patient to be independent with initial HEP.    Time  3    Period  Weeks    Status  Achieved    Target Date  06/25/19        PT Long Term Goals - 08/19/19 0934      PT LONG TERM GOAL #1   Title  Patient to be independent with advanced HEP.    Time  6    Period  Weeks    Status  Achieved      PT LONG TERM GOAL #2   Title  Patient to demonstrate B LE strength >=4+/5.    Time  6    Period  Weeks    Status  Achieved      PT LONG TERM GOAL #3   Title  Patient to demonstrate lumbar AROM WFL and with pain <3/10.    Time  6    Period  Weeks    Status  Partially Met   improved in flexion, extension, and R/L  rotation as welll as pain levels     PT LONG TERM GOAL #4   Title  Patient to score <14 sec on TUG testing with LRAD to decrease risk of falls.    Period  Weeks    Status  Achieved      PT LONG TERM GOAL #5   Title  Patient to report tolerance of 1 hour on her feet without pain limiting.    Time  6    Period  Weeks    Status  Not Met   reporting 15 min of standing and  30 min of walking before onset of pain           Plan - 08/19/19 1221    Clinical Impression Statement  Patient reporting 95% improvement in LB since initial eval. Noting remaining pain with bending forward, twisting, and stirring. Patient at this time has met LE strength and TUG goals. Has also demonstrated progress in lumbar L sidebending AROM as well as pain levels with lumbar AROM. Noting that she is still limited to 15 minutes of standing and 30 minutes of walking before increase in pain. At this time, patient is independent with her HEP and has demonstrated consistent progress towards her goals. Believe she would benefit from continued compliance with HEP at home, which was reviewed and updated today. Patient agreeable and reported understanding of all edu provided. Patient now on 30 day hold d/t meeting or partially meeting most goals.    Comorbidities  thyroid disease, hx of endometrial CA treated with chemo, osteopenia, LBP HTN, HLD, GERD, BPPV, asthma    Examination-Activity Limitations  Sit;Bathing;Bed Mobility;Sleep;Bend;Squat;Stairs;Caring for Others;Carry;Stand;Toileting;Dressing;Transfers;Hygiene/Grooming;Lift;Locomotion Level;Reach Overhead    Examination-Participation Restrictions  Church;Cleaning;Shop;Community Activity;Volunteer;Driving;Yard Work;Interpersonal Relationship;Laundry;Meal Prep    Stability/Clinical Decision Making  Evolving/Moderate complexity    Rehab Potential  Good    PT Frequency  1x / week    PT Duration  6 weeks    PT Treatment/Interventions  ADLs/Self Care Home Management;Cryotherapy;Electrical Stimulation;Moist Heat;Balance training;Therapeutic exercise;Therapeutic activities;Functional mobility training;Stair training;Gait training;Ultrasound;Neuromuscular re-education;Patient/family education;Orthotic Fit/Training;Manual techniques;Vasopneumatic Device;Taping;Energy conservation;Dry needling;Passive range of motion    PT Next Visit Plan  30 day hold at  this time    Consulted and Agree with Plan of Care  Patient       Patient will benefit from skilled therapeutic intervention in order to improve the following deficits and impairments:  Abnormal gait, Decreased activity tolerance, Decreased strength, Increased fascial restricitons, Pain, Increased muscle spasms, Difficulty walking, Decreased balance, Decreased range of motion, Improper body mechanics, Postural dysfunction, Impaired flexibility  Visit Diagnosis: Acute right-sided low back pain with right-sided sciatica  Difficulty in walking, not elsewhere classified  Other abnormalities of gait and mobility  Other symptoms and signs involving the musculoskeletal system     Problem List Patient Active Problem List   Diagnosis Date Noted  . Subacromial impingement of left shoulder 12/30/2018  . Moderate persistent asthma without complication 76/19/5093  . Current use of beta blocker 06/30/2018  . Allergic rhinitis 06/30/2018  . Acute bronchitis due to other specified organisms 06/30/2018  . History of endometrial cancer 02/03/2018  . Screening Mammogram 11/05/2017  . 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  . Type 2 diabetes mellitus with diabetic neuropathy, without long-term current use of insulin ([place]) 10/25/2016  . Microcytosis 09/28/2016  . Mild intermittent asthma without complication 26/71/2458  . Port catheter in place 11/15/2015  . Chemotherapy induced neutropenia ([place]) 11/29/2014  . Family history of breast cancer   .  Family history of ovarian cancer   . Family history of prostate cancer   . Family history of colon cancer   . Morbid obesity with body mass index of 40.0-44.9 in adult [facility]) 11/09/2014  . Chemotherapy-induced peripheral neuropathy ([place]) 09/22/2014  . Endometrial cancer ([place]) 05/20/2014  . 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  . Thyroid disease 06/01/2013  . Low back pain radiating to right leg 08/18/2012  . Anemia 11/07/2011  .  Hyperlipidemia 06/15/2011  . Diabetes mellitus type 2 in obese ([place]) 03/12/2011  . Acute pain of right knee 01/27/2011  . Obstructive sleep apnea 11/15/2010  . Osteoarthritis 11/15/2010  . Gastroesophageal reflux disease without esophagitis 09/14/2010  . DIVERTICULITIS, HX OF 09/14/2010  . POSTMENOPAUSAL STATUS 10/01/2008  . COLONIC POLYPS, HX OF 03/10/2008  . Essential hypertension 08/13/2006    Janene Harvey, PT, DPT 08/19/19 12:23 PM   [facility] [address]  [place], [place], [address] Phone: [phone]   Fax:  [phone]  Name: SHAQUISHA WYNN MRN: [id number] Date of Birth: July 02, 1951   PHYSICAL THERAPY DISCHARGE SUMMARY  Visits from Start of Care: 15  Current functional level related to goals / functional outcomes: See above clinical impresson; patient did not return after 30 day hold    Remaining deficits: Decreased ROM, limited standing tolerance    Education / Equipment: HEP  Plan: Patient agrees to discharge.  Patient goals were partially met. Patient is being discharged due to being pleased with the current functional level.  ?????     Janene Harvey, PT, DPT 09/28/19 11:55 AM

## 2019-09-06 DIAGNOSIS — M25551 Pain in right hip: Secondary | ICD-10-CM | POA: Diagnosis not present

## 2019-09-07 DIAGNOSIS — M25551 Pain in right hip: Secondary | ICD-10-CM | POA: Diagnosis not present

## 2019-09-14 ENCOUNTER — Ambulatory Visit (INDEPENDENT_AMBULATORY_CARE_PROVIDER_SITE_OTHER): Payer: Medicare Other | Admitting: Family Medicine

## 2019-09-14 ENCOUNTER — Encounter: Payer: Self-pay | Admitting: Family Medicine

## 2019-09-14 ENCOUNTER — Other Ambulatory Visit: Payer: Self-pay

## 2019-09-14 ENCOUNTER — Ambulatory Visit: Payer: Self-pay

## 2019-09-14 VITALS — BP 155/99 | HR 98 | Ht 63.0 in | Wt 216.0 lb

## 2019-09-14 DIAGNOSIS — M545 Low back pain, unspecified: Secondary | ICD-10-CM

## 2019-09-14 DIAGNOSIS — R109 Unspecified abdominal pain: Secondary | ICD-10-CM

## 2019-09-14 DIAGNOSIS — R2232 Localized swelling, mass and lump, left upper limb: Secondary | ICD-10-CM

## 2019-09-14 HISTORY — DX: Localized swelling, mass and lump, left upper limb: R22.32

## 2019-09-14 HISTORY — DX: Unspecified abdominal pain: R10.9

## 2019-09-14 NOTE — Progress Notes (Signed)
Katelyn Fisher - 69 y.o. female MRN [id number]  Date of birth: 1951-01-30  SUBJECTIVE:  Including CC & ROS.  Chief Complaint  Patient presents with  . Follow-up    follow up for low back and left shoulder    Katelyn Fisher is a 69 y.o. female that is following up for her low back pain and shoulder pain.  She is also presenting with a lump on her left arm and the pain in her right flank.  She reports the back pain being improved.  She has finished with his physical therapy.  She denies any significant pain in the shoulder.  She has noticed over the past 2 to 3 weeks this significant pain in the right flank.  She denies any radiation to the groin.  She can reproduce it with touch.  Denies any rash.  Denies any inciting event or trauma.  She also has a lump on the lateral aspect the elbow that she has been monitoring for the past 6 months.  She feels it is gotten larger in size.  She denies any heat or redness over this area of the lump.   Review of Systems See HPI   HISTORY: Past Medical, Surgical, Social, and Family History Reviewed & Updated per EMR.   Pertinent Historical Findings include:  Past Medical History:  Diagnosis Date  . Anxiety   . Arthritis    back  . Asthma   . Benign paroxysmal positional vertigo 11/01/2012  . Cancer ([place]) 12/22/2014   endometrial  . Depression   . Endometrial cancer ([place]) 2015  . Family history of breast cancer   . Family history of colon cancer   . Family history of ovarian cancer   . Family history of prostate cancer   . GERD (gastroesophageal reflux disease)   . History of colonic diverticulitis   . Hx of colonic polyps   . Hyperlipidemia   . Hypertension   . 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  . Low back pain radiating to right leg 08/18/2012  . Obesity   . Osteopenia 09/2017   T score -1.2 FRAX 3.2% / 0.3%  . Personal history of chemotherapy 2015  . Radiation 12/17/14-01/03/15   vaginal brachytherapy  . Thyroid disease 06/01/2013     Past Surgical History:  Procedure Laterality Date  . ABDOMINAL HYSTERECTOMY  06/23/14   [facility], TRH/BSO  . CHOLECYSTECTOMY  1991  . DILATION AND CURETTAGE OF UTERUS    . IR REMOVAL TUN ACCESS W/ PORT W/O FL MOD SED  04/30/2017  . TUBAL LIGATION      Family History  Problem Relation Age of Onset  . Heart disease Maternal Aunt        x 3 -2 brothers  . Hypertension Maternal Aunt   . Breast cancer Maternal Aunt        maternal half; dx in her 6s  . Cancer Maternal Aunt        cervical  . Ovarian cancer Maternal Aunt        dx in her 16s  . Prostate cancer Father 97  . Diabetes Father   . Cancer Father        lung cancer, asbestos exposure  . Heart disease Brother   . Diabetes Brother   . Diabetes Mother   . Lupus Mother   . Heart disease Brother   . Hyperlipidemia Sister   . Heart disease Brother   . Diabetes Brother   . Colon polyps Brother   .  Heart disease Brother   . Diabetes Brother   . Colon polyps Brother   . Colon cancer Maternal Grandmother        dx in her 83s  . Leukemia Maternal Uncle 21  . Cancer Paternal Aunt        NOS- breast   . Prostate cancer Paternal Uncle        dx in his 7s  . Breast cancer Other        dx in her 72s    Social History   Socioeconomic History  . Marital status: Married    Spouse name: Not on file  . Number of children: 3  . Years of education: Not on file  . Highest education level: Not on file  Occupational History  . Occupation: retired Network engineer  Tobacco Use  . Smoking status: Never Smoker  . Smokeless tobacco: Never Used  Substance and Sexual Activity  . Alcohol use: No    Alcohol/week: 0.0 standard drinks  . Drug use: No  . Sexual activity: Yes    Partners: Male    Comment: 1st intercourse- 40, partners- 19, married- 71 yrs   Other Topics Concern  . Not on file  Social History Narrative   Married- 32 years   Never Smoked   Alcohol use-no   Drug use-no   Occupation: housewife   Caffeine use/day:   None   Does Patient Exercise:  yes   Social Determinants of Health   Financial Resource Strain:   . Difficulty of Paying Living Expenses: Not on file  Food Insecurity:   . Worried About Charity fundraiser in the Last Year: Not on file  . Ran Out of Food in the Last Year: Not on file  Transportation Needs:   . Lack of Transportation (Medical): Not on file  . Lack of Transportation (Non-Medical): Not on file  Physical Activity:   . Days of Exercise per Week: Not on file  . Minutes of Exercise per Session: Not on file  Stress:   . Feeling of Stress : Not on file  Social Connections:   . Frequency of Communication with Friends and Family: Not on file  . Frequency of Social Gatherings with Friends and Family: Not on file  . Attends Religious Services: Not on file  . Active Member of Clubs or Organizations: Not on file  . Attends Archivist Meetings: Not on file  . Marital Status: Not on file  Intimate Partner Violence:   . Fear of Current or Ex-Partner: Not on file  . Emotionally Abused: Not on file  . Physically Abused: Not on file  . Sexually Abused: Not on file     PHYSICAL EXAM:  VS: BP (!) 155/99   Pulse 98   Ht 5\' 3"  (1.6 m)   Wt 216 lb (98 kg)   BMI 38.26 kg/m  Physical Exam Gen: NAD, alert, cooperative with exam, well-appearing MSK:  Right flank: Tenderness to palpation in this area. No redness or swelling. No crepitus. Left elbow: Mass appreciated over the lateral aspect of the elbow. Normal range of motion. No redness or swelling. Normal grip strength. Neurovascularly intact  Limited ultrasound: Right flank, left elbow:  Right flank: The area of maximal tenderness has hyperemia in the area.  This is occurring in the subcutaneous tissue.  There does not appear to be an extension into the fascia or musculature underneath.  The architecture of the structures appear to be homogeneous with no significant cystic  or walled off formation.  Left  elbow: There is hypoechoic change within the area of this mass on the elbow.  No significant hyperemia within the elbow.  This is occurring within the subcutaneous.  There does not appear to be penetration into the underlying musculature  Summary: Right flank with hyperemia with findings to suggest a possible inflammatory change.  Left elbow with irregularities with soft tissue changes that are nonspecific.  Ultrasound and interpretation by [name], MD    ASSESSMENT & PLAN:   Low back pain radiating to right leg Has improved but only notices pain intermittently. -Counseled on home exercise therapy and supportive care. -If flares could consider MRI for epidural or facet injections.  Mass of left elbow This is been ongoing for greater than 6 months.  She believes it has increased in size.  She does have a history of endometrial cancer.  The architecture was not specific for a cyst. -Counseled to monitor for now. -Would consider fine-needle aspiration versus MRI if still ongoing.  Flank pain Seems less likely for nephrolithiasis or abdominal pain and more superficial in nature.  There was hyperemia in this area.  The tissue appears to be homogeneous but does have increased vascularity in this area. -Monitor for now. -Counseled on supportive care. -Follow-up to reimage.  If ongoing may need to consider further imaging as well.

## 2019-09-14 NOTE — Assessment & Plan Note (Signed)
Has improved but only notices pain intermittently. -Counseled on home exercise therapy and supportive care. -If flares could consider MRI for epidural or facet injections.

## 2019-09-14 NOTE — Assessment & Plan Note (Signed)
This is been ongoing for greater than 6 months.  She believes it has increased in size.  She does have a history of endometrial cancer.  The architecture was not specific for a cyst. -Counseled to monitor for now. -Would consider fine-needle aspiration versus MRI if still ongoing.

## 2019-09-14 NOTE — Patient Instructions (Signed)
Good to see you Keep an eye on these areas   Please send me a message in MyChart with any questions or updates.  Please see me back in 3 weeks.   --Dr. Raeford Razor

## 2019-09-14 NOTE — Assessment & Plan Note (Signed)
Seems less likely for nephrolithiasis or abdominal pain and more superficial in nature.  There was hyperemia in this area.  The tissue appears to be homogeneous but does have increased vascularity in this area. -Monitor for now. -Counseled on supportive care. -Follow-up to reimage.  If ongoing may need to consider further imaging as well.

## 2019-09-15 ENCOUNTER — Other Ambulatory Visit: Payer: Self-pay

## 2019-09-15 ENCOUNTER — Encounter: Payer: Medicare PPO | Admitting: Family Medicine

## 2019-09-16 ENCOUNTER — Ambulatory Visit (INDEPENDENT_AMBULATORY_CARE_PROVIDER_SITE_OTHER): Payer: Medicare Other | Admitting: Obstetrics & Gynecology

## 2019-09-16 ENCOUNTER — Encounter: Payer: Self-pay | Admitting: Obstetrics & Gynecology

## 2019-09-16 VITALS — BP 128/84 | Ht 61.5 in | Wt 239.0 lb

## 2019-09-16 DIAGNOSIS — Z8542 Personal history of malignant neoplasm of other parts of uterus: Secondary | ICD-10-CM

## 2019-09-16 DIAGNOSIS — Z9189 Other specified personal risk factors, not elsewhere classified: Secondary | ICD-10-CM

## 2019-09-16 DIAGNOSIS — Z1272 Encounter for screening for malignant neoplasm of vagina: Secondary | ICD-10-CM

## 2019-09-16 DIAGNOSIS — Z90710 Acquired absence of both cervix and uterus: Secondary | ICD-10-CM

## 2019-09-16 DIAGNOSIS — Z01419 Encounter for gynecological examination (general) (routine) without abnormal findings: Secondary | ICD-10-CM | POA: Diagnosis not present

## 2019-09-16 DIAGNOSIS — Z9289 Personal history of other medical treatment: Secondary | ICD-10-CM | POA: Diagnosis not present

## 2019-09-16 DIAGNOSIS — M85852 Other specified disorders of bone density and structure, left thigh: Secondary | ICD-10-CM

## 2019-09-16 DIAGNOSIS — Z6841 Body Mass Index (BMI) 40.0 and over, adult: Secondary | ICD-10-CM

## 2019-09-16 DIAGNOSIS — M8588 Other specified disorders of bone density and structure, other site: Secondary | ICD-10-CM | POA: Diagnosis not present

## 2019-09-16 DIAGNOSIS — Z9079 Acquired absence of other genital organ(s): Secondary | ICD-10-CM

## 2019-09-16 DIAGNOSIS — C541 Malignant neoplasm of endometrium: Secondary | ICD-10-CM

## 2019-09-16 NOTE — Progress Notes (Signed)
Katelyn Fisher [birth year] [id number]   History:    69 y.o. G2P2L3 1 adopted child.  Married.  RP:  Established patient presenting for annual gyn exam   HPI: Menopause, well on no HRT. 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 Had a total hysterectomy with bilateral salpingo-oophorectomy and staging and postop chemotherapy and radiation treatment which finished in 2016. Released by Gynecologic oncology, will do annual Paps with me now. No pelvic pain. Breasts normal. Last mammogram negative 11/2018 at the [facility]. Health labs with her family physician. BMI back up to 44.43.  Planning to get back to more walking.  Had a CS injection for Back pain.  Past medical history,surgical history, family history and social history were all reviewed and documented in the EPIC chart.  Gynecologic History No LMP recorded. Patient has had a hysterectomy.   Obstetric History OB History  Gravida Para Term Preterm AB Living  2 2       3   SAB TAB Ectopic Multiple Live Births               # Outcome Date GA Lbr Len/2nd Weight Sex Delivery Anes PTL Lv  2 Para           1 Para             Obstetric Comments  1 adopted child     ROS: A ROS was performed and pertinent positives and negatives are included in the history.  GENERAL: No fevers or chills. HEENT: No change in vision, no earache, sore throat or sinus congestion. NECK: No pain or stiffness. CARDIOVASCULAR: No chest pain or pressure. No palpitations. PULMONARY: No shortness of breath, cough or wheeze. GASTROINTESTINAL: No abdominal pain, nausea, vomiting or diarrhea, melena or bright red blood per rectum. GENITOURINARY: No urinary frequency, urgency, hesitancy or dysuria. MUSCULOSKELETAL: No joint or muscle pain, no back pain, no recent trauma. DERMATOLOGIC: No rash, no itching, no lesions. ENDOCRINE: No polyuria, polydipsia, no heat or cold intolerance. No recent change in weight. HEMATOLOGICAL: No  anemia or easy bruising or bleeding. NEUROLOGIC: No headache, seizures, numbness, tingling or weakness. PSYCHIATRIC: No depression, no loss of interest in normal activity or change in sleep pattern.     Exam:   BP 128/84   Ht 5' 1.5" (1.562 m)   Wt 239 lb (108.4 kg)   BMI 44.43 kg/m   Body mass index is 44.43 kg/m.  General appearance : Well developed well nourished female. No acute distress HEENT: Eyes: no retinal hemorrhage or exudates,  Neck supple, trachea midline, no carotid bruits, no thyroidmegaly Lungs: Clear to auscultation, no rhonchi or wheezes, or rib retractions  Heart: Regular rate and rhythm, no murmurs or gallops Breast:Examined in sitting and supine position were symmetrical in appearance, no palpable masses or tenderness,  no skin retraction, no nipple inversion, no nipple discharge, no skin discoloration, no axillary or supraclavicular lymphadenopathy Abdomen: no palpable masses or tenderness, no rebound or guarding Extremities: no edema or skin discoloration or tenderness  Pelvic: Vulva: Normal             Vagina: No gross lesions or discharge.  Pap reflex done.  Cervix/Uterus absent  Adnexa  Without masses or tenderness  Anus: Normal   Assessment/Plan:  69 y.o. female for annual exam   1. Encounter for Papanicolaou smear of vagina as part of routine gynecological examination Gynecologic exam status post TAH/BSO.  History of endometrial cancer.  Pap test done at the vaginal  vault.  Breast exam normal.  Last screening mammogram May 2020 was negative.  Colonoscopy in 2018.  Fasting health labs with family physician.  2. S/P TAH-BSO  3. Endometrial cancer ([place]) Released by gynecologic college E.  Will continue Pap tests at the vaginal vault and gynecologic exam with me annually.  4. Osteopenia of neck of left femur Bone density was normal in 09/2015.  Will repeat BD 09/2020.  Continue with vitamin D supplements, calcium intake of 1200 mg daily and regular  weightbearing physical activities.  5. Class 3 severe obesity due to excess calories with serious comorbidity and body mass index (BMI) of 40.0 to 44.9 in adult [facility]) Recommend a lower calorie/carb diet such as [employer].  Aerobic physical activities 5 times a week and light weightlifting every 2 days.  Princess Bruins MD, 9:57 AM 09/16/2019

## 2019-09-18 LAB — PAP IG W/ RFLX HPV ASCU

## 2019-09-18 LAB — HUMAN PAPILLOMAVIRUS, HIGH RISK: HPV DNA High Risk: NOT DETECTED

## 2019-09-19 ENCOUNTER — Encounter: Payer: Self-pay | Admitting: Obstetrics & Gynecology

## 2019-09-19 NOTE — Patient Instructions (Signed)
1. Encounter for Papanicolaou smear of vagina as part of routine gynecological examination Gynecologic exam status post TAH/BSO.  History of endometrial cancer.  Pap test done at the vaginal vault.  Breast exam normal.  Last screening mammogram May 2020 was negative.  Colonoscopy in 2018.  Fasting health labs with family physician.  2. S/P TAH-BSO  3. Endometrial cancer ([place]) Released by gynecologic college E.  Will continue Pap tests at the vaginal vault and gynecologic exam with me annually.  4. Osteopenia of neck of left femur Bone density was normal in 09/2015.  Will repeat BD 09/2020.  Continue with vitamin D supplements, calcium intake of 1200 mg daily and regular weightbearing physical activities.  5. Class 3 severe obesity due to excess calories with serious comorbidity and body mass index (BMI) of 40.0 to 44.9 in adult [facility]) Recommend a lower calorie/carb diet such as [employer].  Aerobic physical activities 5 times a week and light weightlifting every 2 days.  Drena, it was a pleasure seeing you today!  I will inform you of your results as soon as they are available.

## 2019-09-28 ENCOUNTER — Other Ambulatory Visit: Payer: Self-pay

## 2019-09-29 ENCOUNTER — Ambulatory Visit (INDEPENDENT_AMBULATORY_CARE_PROVIDER_SITE_OTHER): Payer: Medicare Other | Admitting: Family Medicine

## 2019-09-29 ENCOUNTER — Encounter: Payer: Self-pay | Admitting: Family Medicine

## 2019-09-29 ENCOUNTER — Other Ambulatory Visit: Payer: Self-pay

## 2019-09-29 VITALS — BP 120/72 | HR 90 | Temp 96.7°F | Ht 61.5 in | Wt 239.0 lb

## 2019-09-29 DIAGNOSIS — Z0000 Encounter for general adult medical examination without abnormal findings: Secondary | ICD-10-CM

## 2019-09-29 DIAGNOSIS — E1169 Type 2 diabetes mellitus with other specified complication: Secondary | ICD-10-CM | POA: Diagnosis not present

## 2019-09-29 DIAGNOSIS — E669 Obesity, unspecified: Secondary | ICD-10-CM

## 2019-09-29 MED ORDER — ATORVASTATIN CALCIUM 40 MG PO TABS: 40.0000 mg | ORAL_TABLET | Freq: Every day | ORAL | 1 refills | Status: DC

## 2019-09-29 MED ORDER — NEBIVOLOL HCL 10 MG PO TABS: 10.0000 mg | ORAL_TABLET | Freq: Every day | ORAL | 1 refills | Status: DC

## 2019-09-29 MED ORDER — LEVOCETIRIZINE DIHYDROCHLORIDE 5 MG PO TABS: 5.0000 mg | ORAL_TABLET | Freq: Every evening | ORAL | 2 refills | Status: DC

## 2019-09-29 MED ORDER — CARVEDILOL 12.5 MG PO TABS: 12.5000 mg | ORAL_TABLET | Freq: Two times a day (BID) | ORAL | 1 refills | Status: DC

## 2019-09-29 MED ORDER — AMLODIPINE BESYLATE 10 MG PO TABS: 10.0000 mg | ORAL_TABLET | Freq: Every day | ORAL | 1 refills | Status: DC

## 2019-09-29 MED ORDER — FUROSEMIDE 20 MG PO TABS: 20.0000 mg | ORAL_TABLET | Freq: Every day | ORAL | 1 refills | Status: DC

## 2019-09-29 NOTE — Progress Notes (Signed)
Chief Complaint  Patient presents with  . Annual Exam  . Back Pain    going to PT     Well Woman Katelyn Fisher is here for a complete physical. Her last physical was >1 year ago.  Current diet: in general, diet has been poor. Current exercise: not much due to back pain. Weight is increasing and she denies daytime fatigue. Seatbelt? Yes  Health Maintenance Colonoscopy- Yes Shingrix- No DEXA- Yes Mammogram- Yes Tetanus- Yes Pneumonia- Yes Hep C screen- Yes  Past Medical History:  Diagnosis Date  . Anxiety   . Arthritis    back  . Asthma   . Benign paroxysmal positional vertigo 11/01/2012  . Cancer ([place]) 12/22/2014   endometrial  . Depression   . Endometrial cancer ([place]) 2015  . Family history of breast cancer   . Family history of colon cancer   . Family history of ovarian cancer   . Family history of prostate cancer   . GERD (gastroesophageal reflux disease)   . History of colonic diverticulitis   . Hx of colonic polyps   . Hyperlipidemia   . Hypertension   . 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  . Low back pain radiating to right leg 08/18/2012  . Obesity   . Osteopenia 09/2017   T score -1.2 FRAX 3.2% / 0.3%  . Personal history of chemotherapy 2015  . Radiation 12/17/14-01/03/15   vaginal brachytherapy  . Thyroid disease 06/01/2013     Past Surgical History:  Procedure Laterality Date  . ABDOMINAL HYSTERECTOMY  06/23/14   [facility], TRH/BSO  . CHOLECYSTECTOMY  1991  . DILATION AND CURETTAGE OF UTERUS    . IR REMOVAL TUN ACCESS W/ PORT W/O FL MOD SED  04/30/2017  . TUBAL LIGATION      Medications  Current Outpatient Medications on File Prior to Visit  Medication Sig Dispense Refill  . albuterol (PROAIR HFA) 108 (90 Base) MCG/ACT inhaler 2 puffs every 4 hours as needed for coughing or wheezing spells 18 g 1  . albuterol (PROVENTIL) (2.5 MG/3ML) 0.083% nebulizer solution Take 3 mLs (2.5 mg total) by nebulization every 4 (four) hours as needed for wheezing or  shortness of breath. 150 mL 2  . aspirin 81 MG tablet Take 81 mg by mouth daily.      . B Complex CAPS Take by mouth.    . baclofen (LIORESAL) 10 MG tablet TAKE 1/2 TABLET BY MOUTH TWICE DAILY AS NEEDED    . Baclofen 5 MG TABS Take 5 mg by mouth 2 (two) times daily as needed. 40 tablet 0  . benzonatate (TESSALON PERLES) 100 MG capsule Take 1 capsule (100 mg total) by mouth 3 (three) times daily as needed for cough. 20 capsule 0  . budesonide-formoterol (SYMBICORT) 160-4.5 MCG/ACT inhaler Inhale 2 puffs into the lungs 2 (two) times daily.    Marland Kitchen EPINEPHrine (EPIPEN 2-PAK) 0.3 mg/0.3 mL IJ SOAJ injection Inject 0.3 mg into the muscle once. Use as directed for severe allergic reactions    . ketoconazole (NIZORAL) 2 % cream APPLY 1 APPLICATION TOPICALLY ONCE DAILY BETWEEN TOES. 60 g 0  . Multiple Vitamin (MULTIVITAMIN) tablet Take 1 tablet by mouth daily.    . traMADol (ULTRAM) 50 MG tablet Take 1 tablet (50 mg total) by mouth every 12 (twelve) hours as needed. 15 tablet 0   Allergies Allergies  Allergen Reactions  . Shellfish Allergy Hives, Shortness Of Breath and Swelling  . Oxycodone Swelling    Facial swelling and tightness  .  Penicillins Swelling    Rash with welps  . Iodinated Diagnostic Agents Rash  . Sulfa Antibiotics Rash    Review of Systems: Constitutional:  no sweats Eye:  no recent significant change in vision Ear/Nose/Mouth/Throat:  Ears:  No changes in hearing Nose/Mouth/Throat:  no complaints of nasal congestion, no sore throat Cardiovascular: no chest pain Respiratory:  No cough and no shortness of breath Gastrointestinal:  no abdominal pain, no change in bowel habits GU:  Female: negative for dysuria or pelvic pain Musculoskeletal/Extremities:  no new pain of the joints Integumentary (Skin/Breast):  no abnormal skin lesions reported Neurologic:  no headaches Psychiatric:  no anxiety, no depression Endocrine:  denies unexplained weight changes Hematologic/Lymphatic:   no abnormal bleeding  Exam BP 120/72 (BP Location: Left Arm, Patient Position: Sitting, Cuff Size: Normal)   Pulse 90   Temp (!) 96.7 F (35.9 C) (Temporal)   Ht 5' 1.5" (1.562 m)   Wt 239 lb (108.4 kg)   SpO2 95%   BMI 44.43 kg/m  General:  well developed, well nourished, in no apparent distress Skin:  no significant moles, warts, or growths Head:  no masses, lesions, or tenderness Eyes:  pupils equal and round, sclera anicteric without injection Ears:  canals without lesions, TMs shiny without retraction, no obvious effusion, no erythema Nose:  nares patent, septum midline, mucosa normal, and no drainage or sinus tenderness Throat/Pharynx:  lips and gingiva without lesion; tongue and uvula midline; non-inflamed pharynx; no exudates or postnasal drainage Neck: neck supple without adenopathy, thyromegaly, or masses Lungs:  clear to auscultation, breath sounds equal bilaterally, no respiratory distress Cardio:  regular rate and rhythm, no bruits or LE edema Abdomen:  abdomen soft, nontender; bowel sounds normal; no masses or organomegaly Genital: Deferred Musculoskeletal:  symmetrical muscle groups noted without atrophy or deformity Extremities:  no clubbing, cyanosis, or edema, no deformities, no skin discoloration Neuro:  gait normal; deep tendon reflexes normal and symmetric Psych: well oriented with normal range of affect and appropriate judgment/insight  Assessment and Plan  Well adult exam - Plan: Comprehensive metabolic panel, Lipid panel  Diabetes mellitus type 2 in obese ([place]) - Plan: atorvastatin (LIPITOR) 40 MG tablet, Hemoglobin A1c   Well 69 y.o. female. Counseled on diet and exercise. Other orders as above. Follow up in 6 mo pending the above workup. The patient voiced understanding and agreement to the plan.  [place], DO 09/29/19 3:58 PM

## 2019-09-29 NOTE — Patient Instructions (Addendum)
Give [place] 2-3 business days to get the results of your labs back.   Keep the diet clean and stay active.  The new Shingrix vaccine (for shingles) is a 2 shot series. It can make people feel low energy, achy and almost like they have the flu for 48 hours after injection. Please plan accordingly when deciding on when to get this shot. Call your pharmacy for an appointment to get this. The second shot of the series is less severe regarding the side effects, but it still lasts 48 hours.   Take Pepcid (famotidine) 20 mg twice daily as needed for reflux.   Let us know if you need anything.

## 2019-09-30 LAB — LIPID PANEL
Cholesterol: 164 mg/dL (ref 0–200)
HDL: 61.5 mg/dL (ref >39.00)
LDL Cholesterol: 84 mg/dL (ref 0–99)
NonHDL: 102.79
Total CHOL/HDL Ratio: 3
Triglycerides: 93 mg/dL (ref 0.0–149.0)
VLDL: 18.6 mg/dL (ref 0.0–40.0)

## 2019-09-30 LAB — COMPREHENSIVE METABOLIC PANEL
ALT: 25 U/L (ref 0–35)
AST: 22 U/L (ref 0–37)
Albumin: 3.9 g/dL (ref 3.5–5.2)
Alkaline Phosphatase: 100 U/L (ref 39–117)
BUN: 14 mg/dL (ref 6–23)
CO2: 31 mEq/L (ref 19–32)
Calcium: 9.5 mg/dL (ref 8.4–10.5)
Chloride: 101 mEq/L (ref 96–112)
Creatinine, Ser: 0.94 mg/dL (ref 0.40–1.20)
GFR: 71.7 mL/min (ref >60.00)
Glucose, Bld: 99 mg/dL (ref 70–99)
Potassium: 3.9 mEq/L (ref 3.5–5.1)
Sodium: 140 mEq/L (ref 135–145)
Total Bilirubin: 0.4 mg/dL (ref 0.2–1.2)
Total Protein: 7.2 g/dL (ref 6.0–8.3)

## 2019-09-30 LAB — HEMOGLOBIN A1C: Hgb A1c MFr Bld: 7 % — ABNORMAL HIGH (ref 4.6–6.5)

## 2019-10-04 DIAGNOSIS — M25551 Pain in right hip: Secondary | ICD-10-CM | POA: Diagnosis not present

## 2019-10-05 ENCOUNTER — Encounter: Payer: Self-pay | Admitting: Family Medicine

## 2019-10-05 ENCOUNTER — Other Ambulatory Visit: Payer: Self-pay

## 2019-10-05 ENCOUNTER — Ambulatory Visit (INDEPENDENT_AMBULATORY_CARE_PROVIDER_SITE_OTHER): Payer: Medicare Other | Admitting: Family Medicine

## 2019-10-05 ENCOUNTER — Other Ambulatory Visit (HOSPITAL_BASED_OUTPATIENT_CLINIC_OR_DEPARTMENT_OTHER): Payer: Self-pay | Admitting: Family Medicine

## 2019-10-05 ENCOUNTER — Ambulatory Visit (HOSPITAL_BASED_OUTPATIENT_CLINIC_OR_DEPARTMENT_OTHER)
Admission: RE | Admit: 2019-10-05 | Discharge: 2019-10-05 | Disposition: A | Discharge status: Home / Self Care | Payer: Medicare Other | Source: Ambulatory Visit | Attending: Family Medicine | Admitting: Family Medicine

## 2019-10-05 VITALS — BP 131/85 | HR 85 | Ht 62.0 in | Wt 220.0 lb

## 2019-10-05 DIAGNOSIS — M25522 Pain in left elbow: Secondary | ICD-10-CM | POA: Diagnosis not present

## 2019-10-05 DIAGNOSIS — R2232 Localized swelling, mass and lump, left upper limb: Secondary | ICD-10-CM | POA: Insufficient documentation

## 2019-10-05 DIAGNOSIS — R109 Unspecified abdominal pain: Secondary | ICD-10-CM | POA: Diagnosis not present

## 2019-10-05 DIAGNOSIS — Z1231 Encounter for screening mammogram for malignant neoplasm of breast: Secondary | ICD-10-CM

## 2019-10-05 DIAGNOSIS — M545 Low back pain: Secondary | ICD-10-CM

## 2019-10-05 DIAGNOSIS — M79604 Pain in right leg: Secondary | ICD-10-CM

## 2019-10-05 NOTE — Assessment & Plan Note (Signed)
No changes in still ongoing.  Has a history of endometrial cancer.   -X-ray -MRI of elbow to evaluate for soft tissue mass.  Concerning as she has a history of cancer and to rule out any contribution from that.

## 2019-10-05 NOTE — Assessment & Plan Note (Signed)
Pain is still ongoing but seems less severe.  There is a palpable soft tissue prominence in this area.  Unclear if this is a lymph node.  No significant structural changes were observed on ultrasound on 2/22. -Could consider x-ray or repeat ultrasound.

## 2019-10-05 NOTE — Progress Notes (Signed)
Katelyn Fisher - 69 y.o. female MRN [id number]  Date of birth: Sep 04, 1950  SUBJECTIVE:  Including CC & ROS.  Chief Complaint  Patient presents with  . Follow-up    follow up for right-side flank     Katelyn Fisher is a 69 y.o. female that is following up for her left forearm mass, flank pain and back pain.  She has noticed her back pain more and it seems to radiate down the right side.  She is noticing some left lateral leg pain as well but has been walking more.  She is also experiencing the flank pain that she was seen for on 2/22 but it seems subsided for now.   Review of Systems See HPI   HISTORY: Past Medical, Surgical, Social, and Family History Reviewed & Updated per EMR.   Pertinent Historical Findings include:  Past Medical History:  Diagnosis Date  . Anxiety   . Arthritis    back  . Asthma   . Benign paroxysmal positional vertigo 11/01/2012  . Cancer ([place]) 12/22/2014   endometrial  . Depression   . Endometrial cancer ([place]) 2015  . Family history of breast cancer   . Family history of colon cancer   . Family history of ovarian cancer   . Family history of prostate cancer   . GERD (gastroesophageal reflux disease)   . History of colonic diverticulitis   . Hx of colonic polyps   . Hyperlipidemia   . Hypertension   . 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  . Low back pain radiating to right leg 08/18/2012  . Obesity   . Osteopenia 09/2017   T score -1.2 FRAX 3.2% / 0.3%  . Personal history of chemotherapy 2015  . Radiation 12/17/14-01/03/15   vaginal brachytherapy  . Thyroid disease 06/01/2013    Past Surgical History:  Procedure Laterality Date  . ABDOMINAL HYSTERECTOMY  06/23/14   [facility], TRH/BSO  . CHOLECYSTECTOMY  1991  . DILATION AND CURETTAGE OF UTERUS    . IR REMOVAL TUN ACCESS W/ PORT W/O FL MOD SED  04/30/2017  . TUBAL LIGATION      Family History  Problem Relation Age of Onset  . Heart disease Maternal Aunt        x 3 -2 brothers  . Hypertension  Maternal Aunt   . Breast cancer Maternal Aunt        maternal half; dx in her 79s  . Cancer Maternal Aunt        cervical  . Ovarian cancer Maternal Aunt        dx in her 35s  . Prostate cancer Father 70  . Diabetes Father   . Cancer Father        lung cancer, asbestos exposure  . Heart disease Brother   . Diabetes Brother   . Diabetes Mother   . Lupus Mother   . Heart disease Brother   . Hyperlipidemia Sister   . Heart disease Brother   . Diabetes Brother   . Colon polyps Brother   . Heart disease Brother   . Diabetes Brother   . Colon polyps Brother   . Colon cancer Maternal Grandmother        dx in her 66s  . Leukemia Maternal Uncle 21  . Cancer Paternal Aunt        NOS- breast   . Prostate cancer Paternal Uncle        dx in his 47s  . Breast cancer Other  dx in her 13s    Social History   Socioeconomic History  . Marital status: Married    Spouse name: Not on file  . Number of children: 3  . Years of education: Not on file  . Highest education level: Not on file  Occupational History  . Occupation: retired Network engineer  Tobacco Use  . Smoking status: Never Smoker  . Smokeless tobacco: Never Used  Substance and Sexual Activity  . Alcohol use: No    Alcohol/week: 0.0 standard drinks  . Drug use: No  . Sexual activity: Yes    Partners: Male    Comment: 1st intercourse- 21, partners- 18, married- 75 yrs   Other Topics Concern  . Not on file  Social History Narrative   Married- 72 years   Never Smoked   Alcohol use-no   Drug use-no   Occupation: housewife   Caffeine use/day:  None   Does Patient Exercise:  yes   Social Determinants of Radio broadcast assistant Strain:   . Difficulty of Paying Living Expenses:   Food Insecurity:   . Worried About Charity fundraiser in the Last Year:   . Arboriculturist in the Last Year:   Transportation Needs:   . Film/video editor (Medical):   Marland Kitchen Lack of Transportation (Non-Medical):   Physical  Activity:   . Days of Exercise per Week:   . Minutes of Exercise per Session:   Stress:   . Feeling of Stress :   Social Connections:   . Frequency of Communication with Friends and Family:   . Frequency of Social Gatherings with Friends and Family:   . Attends Religious Services:   . Active Member of Clubs or Organizations:   . Attends Archivist Meetings:   Marland Kitchen Marital Status:   Intimate Partner Violence:   . Fear of Current or Ex-Partner:   . Emotionally Abused:   Marland Kitchen Physically Abused:   . Sexually Abused:      PHYSICAL EXAM:  VS: BP 131/85   Pulse 85   Ht 5\' 2"  (1.575 m)   Wt 220 lb (99.8 kg)   BMI 40.24 kg/m  Physical Exam Gen: NAD, alert, cooperative with exam, well-appearing MSK:  Left elbow: Soft tissue mass still present. Normal elbow range of motion. No instability. Normal grip strength. Right flank: Palpable soft tissue area in the mid axillary region. This is tender to palpation. No overlying skin changes. Neurovascularly intact     ASSESSMENT & PLAN:   Mass of left elbow No changes in still ongoing.  Has a history of endometrial cancer.   -X-ray -MRI of elbow to evaluate for soft tissue mass.  Concerning as she has a history of cancer and to rule out any contribution from that.  Flank pain Pain is still ongoing but seems less severe.  There is a palpable soft tissue prominence in this area.  Unclear if this is a lymph node.  No significant structural changes were observed on ultrasound on 2/22. -Could consider x-ray or repeat ultrasound.    Low back pain radiating to right leg Pain has been ongoing for months now.  Imaging is showed degenerative changes of lumbar spine.  She is having intermittent radicular symptoms. -Counseled on home exercise therapy and supportive care. -MRI to evaluate for nerve impingement and for epidural or facet injections.

## 2019-10-05 NOTE — Patient Instructions (Signed)
Good to see you Please try using a recumbent bike to help with the left leg pain.  Please try heat on the lower back  I will call with the xray results.  We will get MRI's at [facility]   Please send me a message in [place] with any questions or updates.  We will set up a virtual visit once the MRI's are resulted.   --Dr. Raeford Razor

## 2019-10-05 NOTE — Assessment & Plan Note (Signed)
Pain has been ongoing for months now.  Imaging is showed degenerative changes of lumbar spine.  She is having intermittent radicular symptoms. -Counseled on home exercise therapy and supportive care. -MRI to evaluate for nerve impingement and for epidural or facet injections.

## 2019-10-06 ENCOUNTER — Telehealth: Payer: Self-pay | Admitting: Family Medicine

## 2019-10-06 DIAGNOSIS — I8310 Varicose veins of unspecified lower extremity with inflammation: Secondary | ICD-10-CM | POA: Diagnosis not present

## 2019-10-06 NOTE — Addendum Note (Signed)
Addended by: Sherrie George F on: 10/06/2019 04:03 PM   Modules accepted: Orders

## 2019-10-06 NOTE — Telephone Encounter (Signed)
Informed about xrays.   Rosemarie Ax, MD [facility] 10/06/2019, 9:15 AM

## 2019-10-07 ENCOUNTER — Other Ambulatory Visit: Payer: Self-pay | Admitting: Family Medicine

## 2019-10-07 MED ORDER — DIAZEPAM 5 MG PO TABS: ORAL_TABLET | ORAL | 0 refills | Status: DC

## 2019-10-07 MED FILL — diazePAM 5 MG TABS: 5 | 2 days supply | Qty: 2 | Fill #0

## 2019-10-07 NOTE — Progress Notes (Signed)
Provided valium for MRi.   Rosemarie Ax, MD [facility] 10/07/2019, 10:52 AM

## 2019-10-08 ENCOUNTER — Encounter: Payer: Self-pay | Admitting: Family Medicine

## 2019-10-08 ENCOUNTER — Other Ambulatory Visit: Payer: Self-pay | Admitting: Family Medicine

## 2019-10-08 DIAGNOSIS — E119 Type 2 diabetes mellitus without complications: Secondary | ICD-10-CM | POA: Diagnosis not present

## 2019-10-08 DIAGNOSIS — H35033 Hypertensive retinopathy, bilateral: Secondary | ICD-10-CM | POA: Diagnosis not present

## 2019-10-08 LAB — HM DIABETES EYE EXAM

## 2019-10-08 MED ORDER — BENAZEPRIL HCL 20 MG PO TABS: 20.0000 mg | ORAL_TABLET | Freq: Every day | ORAL | 3 refills | Status: DC

## 2019-10-09 ENCOUNTER — Encounter: Payer: Self-pay | Admitting: Physical Medicine and Rehabilitation

## 2019-10-10 ENCOUNTER — Other Ambulatory Visit: Payer: Self-pay

## 2019-10-10 ENCOUNTER — Encounter (HOSPITAL_BASED_OUTPATIENT_CLINIC_OR_DEPARTMENT_OTHER): Payer: Self-pay

## 2019-10-10 ENCOUNTER — Ambulatory Visit (HOSPITAL_BASED_OUTPATIENT_CLINIC_OR_DEPARTMENT_OTHER)
Admission: RE | Admit: 2019-10-10 | Discharge: 2019-10-10 | Disposition: A | Discharge status: Home / Self Care | Payer: Medicare Other | Source: Ambulatory Visit | Attending: Family Medicine | Admitting: Family Medicine

## 2019-10-10 ENCOUNTER — Other Ambulatory Visit: Payer: Self-pay | Admitting: Family Medicine

## 2019-10-10 DIAGNOSIS — R2232 Localized swelling, mass and lump, left upper limb: Secondary | ICD-10-CM | POA: Insufficient documentation

## 2019-10-10 DIAGNOSIS — M79604 Pain in right leg: Secondary | ICD-10-CM

## 2019-10-10 DIAGNOSIS — M545 Low back pain: Secondary | ICD-10-CM | POA: Diagnosis not present

## 2019-10-12 ENCOUNTER — Telehealth (INDEPENDENT_AMBULATORY_CARE_PROVIDER_SITE_OTHER): Payer: Medicare Other | Admitting: Family Medicine

## 2019-10-12 ENCOUNTER — Telehealth: Payer: Self-pay | Admitting: Nurse Practitioner

## 2019-10-12 DIAGNOSIS — R2232 Localized swelling, mass and lump, left upper limb: Secondary | ICD-10-CM | POA: Diagnosis not present

## 2019-10-12 DIAGNOSIS — M79604 Pain in right leg: Secondary | ICD-10-CM

## 2019-10-12 DIAGNOSIS — M545 Low back pain: Secondary | ICD-10-CM

## 2019-10-12 MED ORDER — BENAZEPRIL HCL 20 MG PO TABS: 20.0000 mg | ORAL_TABLET | Freq: Every day | ORAL | 3 refills | Status: DC

## 2019-10-12 MED FILL — predniSONE 50 MG TABS: 50 | 3 days supply | Qty: 3 | Fill #0

## 2019-10-12 NOTE — Assessment & Plan Note (Signed)
MRI of the elbow is normal. -Monitor as needed.

## 2019-10-12 NOTE — Progress Notes (Signed)
Virtual Visit via Video Note  I connected with [place] on 10/12/19 at  1:30 PM EDT by a video enabled telemedicine application and verified that I am speaking with the correct person using two identifiers.   I discussed the limitations of evaluation and management by telemedicine and the availability of in person appointments. The patient expressed understanding and agreed to proceed.  History of Present Illness:  Katelyn Fisher is a 69 yo F that is following up for her low back pain and mass on her left elbow.  The MRI of the left elbow was essentially normal.  She was unable to have contrast as they are not able to get an IV placed.  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  Also showing spinal stenosis.   Observations/Objective:  Gen: NAD, alert, cooperative with exam, well-appearing   Assessment and Plan:  Mass of left elbow: MRI of the elbow is normal. -Monitor as needed.  Low back pain radiating to the right leg: MRI revealing for facet arthritis.  Most of her pain is localized to her lower back with less symptoms of radiculopathy no significant neurogenic claudication. - facet injection L4-L5 and L5-S1 bilateral  - counseled on HEP and supportive care   Follow Up Instructions:    I discussed the assessment and treatment plan with the patient. The patient was provided an opportunity to ask questions and all were answered. The patient agreed with the plan and demonstrated an understanding of the instructions.   The patient was advised to call back or seek an in-person evaluation if the symptoms worsen or if the condition fails to improve as anticipated.   [name], MD

## 2019-10-12 NOTE — Telephone Encounter (Signed)
Phone call to patient to review instructions for 13 hr prep for epidural steroid injection w/ contrast on 10/19/19 at 0830. Prescription called into [facility]. Pt aware and verbalized understanding of instructions. Prescription: 1930- 50mg  Prednisone 0130- 50mg  Prednisone 0730- 50mg  Prednisone and 50mg  Benadryl

## 2019-10-12 NOTE — Assessment & Plan Note (Signed)
MRI revealing for facet arthritis.  Most of her pain is localized to her lower back with less symptoms of radiculopathy no significant neurogenic claudication. - facet injection L4-L5 and L5-S1 bilateral  - counseled on HEP and supportive care

## 2019-10-19 ENCOUNTER — Ambulatory Visit
Admission: RE | Admit: 2019-10-19 | Discharge: 2019-10-19 | Disposition: A | Discharge status: Home / Self Care | Payer: Medicare Other | Source: Ambulatory Visit | Attending: Family Medicine | Admitting: Family Medicine

## 2019-10-19 ENCOUNTER — Other Ambulatory Visit: Payer: Self-pay | Admitting: Family Medicine

## 2019-10-19 ENCOUNTER — Other Ambulatory Visit: Payer: Self-pay

## 2019-10-19 DIAGNOSIS — M545 Low back pain, unspecified: Secondary | ICD-10-CM

## 2019-10-19 DIAGNOSIS — M79604 Pain in right leg: Secondary | ICD-10-CM

## 2019-10-19 MED ORDER — METHYLPREDNISOLONE ACETATE 40 MG/ML INJ SUSP (RADIOLOG
120.0000 mg | Freq: Once | INTRAMUSCULAR | Status: AC
  Administered 2019-10-19: 120 mg via INTRA_ARTICULAR

## 2019-10-19 MED ORDER — IOPAMIDOL (ISOVUE-M 200) INJECTION 41%
1.0000 mL | Freq: Once | INTRAMUSCULAR | Status: AC
  Administered 2019-10-19: 1 mL via EPIDURAL

## 2019-10-19 NOTE — Discharge Instructions (Signed)

## 2019-11-04 DIAGNOSIS — M25551 Pain in right hip: Secondary | ICD-10-CM | POA: Diagnosis not present

## 2019-11-06 ENCOUNTER — Encounter: Payer: Self-pay | Admitting: Family Medicine

## 2019-11-11 ENCOUNTER — Ambulatory Visit (INDEPENDENT_AMBULATORY_CARE_PROVIDER_SITE_OTHER): Payer: Medicare Other | Admitting: Family Medicine

## 2019-11-11 ENCOUNTER — Other Ambulatory Visit: Payer: Self-pay

## 2019-11-11 ENCOUNTER — Encounter: Payer: Self-pay | Admitting: Family Medicine

## 2019-11-11 VITALS — BP 130/80 | HR 78 | Temp 98.2°F | Resp 20 | Ht 61.4 in | Wt 235.7 lb

## 2019-11-11 DIAGNOSIS — J309 Allergic rhinitis, unspecified: Secondary | ICD-10-CM | POA: Diagnosis not present

## 2019-11-11 DIAGNOSIS — K219 Gastro-esophageal reflux disease without esophagitis: Secondary | ICD-10-CM | POA: Diagnosis not present

## 2019-11-11 DIAGNOSIS — T7800XD Anaphylactic reaction due to unspecified food, subsequent encounter: Secondary | ICD-10-CM | POA: Diagnosis not present

## 2019-11-11 DIAGNOSIS — E1140 Type 2 diabetes mellitus with diabetic neuropathy, unspecified: Secondary | ICD-10-CM | POA: Diagnosis not present

## 2019-11-11 DIAGNOSIS — J4540 Moderate persistent asthma, uncomplicated: Secondary | ICD-10-CM

## 2019-11-11 DIAGNOSIS — Z79899 Other long term (current) drug therapy: Secondary | ICD-10-CM

## 2019-11-11 DIAGNOSIS — Z8542 Personal history of malignant neoplasm of other parts of uterus: Secondary | ICD-10-CM

## 2019-11-11 MED ORDER — FAMOTIDINE 20 MG PO TABS: 20.0000 mg | ORAL_TABLET | Freq: Two times a day (BID) | ORAL | 5 refills | Status: DC

## 2019-11-11 MED ORDER — EPINEPHRINE 0.3 MG/0.3ML IJ SOAJ: INTRAMUSCULAR | 3 refills | Status: DC

## 2019-11-11 MED FILL — FAMOTIDINE 20 MG TABS: 20 | 30 days supply | Qty: 60 | Fill #0

## 2019-11-11 NOTE — Progress Notes (Signed)
[address] [place] [address] Dept: [phone]  FOLLOW UP NOTE  Patient ID: Katelyn Fisher, female    DOB: 1951-04-02  Age: 69 y.o. MRN: [id number] Date of Office Visit: 11/11/2019  Assessment  Chief Complaint: Asthma (doing well.) and Allergic Rhinitis   HPI Katelyn Fisher presents for follow-up of moderate persistent asthma, allergic rhinitis, reflux, and anaphylactic shock due to food.  She was last seen on May 20, 2019 by Gareth Morgan, FNP.  Asthma is reported as well controlled with the use of Symbicort 160-1 puff twice a day with spacer and albuterol as needed.  She reports intermittent use of albuterol for her wheezing that she mainly has when she is lying down at night.  She denies any coughing tightness in her chest or shortness of breath.  She is not made any trips to the emergency room or urgent care since her last office visit.  She did report that she received a steroid injection in her back approximately 3 weeks ago for her back pain.  Allergic rhinitis is reported as well controlled with the use of Zyrtec 10 mg once a day and Flonase 1 to 2 sprays each nostril as needed.  She reports occasional sneezing and denies any runny or stuffy nose.  She continues to avoid shellfish and has not had any accidental ingestion since her last office visit.  Her current medications are listed in the chart.   Drug Allergies:  Allergies  Allergen Reactions  . Shellfish Allergy Hives, Shortness Of Breath and Swelling  . Oxycodone Swelling    Facial swelling and tightness  . Penicillins Swelling    Rash with welps  . Iodinated Diagnostic Agents Rash  . Sulfa Antibiotics Rash    Physical Exam: BP 130/80 (BP Location: Right Arm, Patient Position: Sitting, Cuff Size: Large)   Pulse 78   Temp 98.2 F (36.8 C) (Oral)   Resp 20   Ht 5' 1.4" (1.56 m)   Wt 235 lb 10.8 oz (106.9 kg)   SpO2 99%   BMI 43.95 kg/m    Physical Exam Constitutional:      Appearance: Normal  appearance.  HENT:     Head: Normocephalic and atraumatic.     Comments: Pharynx normal. Eyes normal. Nose normal. Right ear with cerumen-unable to visualize tympanic membrane. Left ear normal.    Right Ear: Ear canal and external ear normal.     Left Ear: Tympanic membrane, ear canal and external ear normal.     Nose: Nose normal.     Mouth/Throat:     Mouth: Mucous membranes are moist.  Eyes:     Conjunctiva/sclera: Conjunctivae normal.  Cardiovascular:     Rate and Rhythm: Normal rate and regular rhythm.     Heart sounds: Normal heart sounds.  Pulmonary:     Effort: Pulmonary effort is normal.     Breath sounds: Normal breath sounds.     Comments: Lungs clear to auscultation. Skin:    General: Skin is warm.  Neurological:     General: No focal deficit present.     Mental Status: She is alert and oriented to person, place, and time.  Psychiatric:        Mood and Affect: Mood normal.        Behavior: Behavior normal.        Thought Content: Thought content normal.        Judgment: Judgment normal.     Diagnostics: FVC 1.84 L, FEV1 1.41  L.  Predicted FVC 2.13 L, FEV1 1.65 L.  Spirometry indicates normal ventilatory function.    Assessment and Plan: 1. Moderate persistent asthma without complication   2. Allergic rhinitis, unspecified seasonality, unspecified trigger   3. Gastroesophageal reflux disease without esophagitis   4. Anaphylactic shock due to food, subsequent encounter   5. Type 2 diabetes mellitus with diabetic neuropathy, without long-term current use of insulin ([place])   6. Current use of beta blocker   7. History of endometrial cancer     Meds ordered this encounter  Medications  . famotidine (PEPCID) 20 MG tablet    Sig: Take 1 tablet (20 mg total) by mouth 2 (two) times daily.    Dispense:  60 tablet    Refill:  5  . EPINEPHrine (EPIPEN 2-PAK) 0.3 mg/0.3 mL IJ SOAJ injection    Sig: Use as directed for severe allergic reaction    Dispense:  2 each     Refill:  3    Keep rx on file.  Patient will call for fill.    Patient Instructions  Asthma Continue Symbicort 160- take one puff every twelve hours. Rinse mouth out after to help prevent thrush. May use albuterol 2 puffs every 4 hours as needed for cough, wheeze, tightness in chest or shortness of breath. Asthma control goals:   Full participation in all desired activities (may need albuterol before activity)  Albuterol use two time or less a week on average (not counting use with activity)  Cough interfering with sleep two time or less a month  Oral steroids no more than once a year  No hospitalizations  Allergic rhinitis Continue flonase nasal spray- using 1-2 sprays each nostril once a day as needed for stuffy nose Continue cetirizine 10 mg one tablet once a day as needed for runny nose or itching May use saline nasal rinses as needed  Reflux Start famotidine 20 mg take one tablet twice a day Continue dietary and lifestyle modifications  Food allergy Continue to avoid shellfish.  In case of an allergic reaction, give Benadryl 50 mg  every 4 hours, and if life-threatening symptoms occur, inject with EpiPen 0.3 mg.  Continue all other current medications. Please let us know if this treatment plan is not working well for you  Schedule follow up appointment in 6 months   Return in about 6 months (around 05/12/2020), or if symptoms worsen or fail to improve.   Thank you for the opportunity to care for this patient.  Please do not hesitate to contact me with questions.  Althea Charon, FNP [facility]   I have provided oversight concerning Katelyn Fisher' evaluation and treatment of this patient's health issues addressed during today's encounter. I agree with the assessment and therapeutic plan as outlined in the note.   Thank you for the opportunity to care for this patient.  Please do not hesitate to contact  me with questions.  Penne Lash, M.D.  [facility] [address] [place], [place] [address] [phone]

## 2019-11-11 NOTE — Patient Instructions (Addendum)
Asthma Continue Symbicort 160- take one puff every twelve hours. Rinse mouth out after to help prevent thrush. May use albuterol 2 puffs every 4 hours as needed for cough, wheeze, tightness in chest or shortness of breath. Asthma control goals:   Full participation in all desired activities (may need albuterol before activity)  Albuterol use two time or less a week on average (not counting use with activity)  Cough interfering with sleep two time or less a month  Oral steroids no more than once a year  No hospitalizations  Allergic rhinitis Continue flonase nasal spray- using 1-2 sprays each nostril once a day as needed for stuffy nose Continue cetirizine 10 mg one tablet once a day as needed for runny nose or itching May use saline nasal rinses as needed  Reflux Start famotidine 20 mg take one tablet twice a day Continue dietary and lifestyle modifications  Food allergy Continue to avoid shellfish.  In case of an allergic reaction, give Benadryl 50 mg  every 4 hours, and if life-threatening symptoms occur, inject with EpiPen 0.3 mg.  Continue all other current medications. Please let us know if this treatment plan is not working well for you  Schedule follow up appointment in 6 months

## 2019-11-12 MED FILL — EPINEPHRINE 0.3 MG AUTO-INJ: 0.3 | 15 days supply | Qty: 2 | Fill #0

## 2019-11-12 NOTE — Addendum Note (Signed)
Addended by: Gerre Pebbles A on: 11/12/2019 12:32 PM   Modules accepted: Orders

## 2019-12-04 ENCOUNTER — Ambulatory Visit (HOSPITAL_BASED_OUTPATIENT_CLINIC_OR_DEPARTMENT_OTHER)
Admission: RE | Admit: 2019-12-04 | Discharge: 2019-12-04 | Disposition: A | Discharge status: Home / Self Care | Payer: Medicare Other | Source: Ambulatory Visit | Attending: Family Medicine | Admitting: Family Medicine

## 2019-12-04 ENCOUNTER — Other Ambulatory Visit: Payer: Self-pay

## 2019-12-04 DIAGNOSIS — Z1231 Encounter for screening mammogram for malignant neoplasm of breast: Secondary | ICD-10-CM

## 2019-12-04 DIAGNOSIS — M25551 Pain in right hip: Secondary | ICD-10-CM | POA: Diagnosis not present

## 2019-12-16 NOTE — Progress Notes (Signed)
I connected with Rilynne today by telephone and verified that I am speaking with the correct person using two identifiers. Location patient: home Location provider: work Persons participating in the virtual visit: patient, RN   I discussed the limitations, risks, security and privacy concerns of performing an evaluation and management service by telephone and the availability of in person appointments. I also discussed with the patient that there may be a patient responsible charge related to this service. The patient expressed understanding and verbally consented to this telephonic visit.    Interactive audio and video telecommunications were attempted between RN and patient, however failed, due to patient having technical difficulties OR patient did not have access to video capability.  We continued and completed visit with audio only.  Some vital signs may be absent or patient reported.    Subjective:   RUTHI TEAL is a 69 y.o. female who presents for Medicare Annual (Subsequent) preventive examination.  Review of Systems:  Home Safety/Smoke Alarms: Feels safe in home. Smoke alarms in place.   Lives w/ husband in 1 story home.   Female:   Pap-  09/08/18-neg     Mammo-   12/04/19    Dexa scan-  10/15/17. Pt states she does w/ Dr. Dellis Filbert. Will request records.   CCS- 10/24/16. Recall 5 yrs.     Objective:     Vitals: Unable to assess. This visit is enabled though telemedicine due to Covid 19.   Advanced Directives 12/17/2019 06/04/2019 12/16/2018 09/25/2018 09/22/2018 03/20/2018 12/05/2017  Does Patient Have a Medical Advance Directive? No Yes Yes Yes Yes Yes Yes  Type of Advance Directive - Public librarian;Living will Living will - [place];Living will -  Does patient want to make changes to medical advance directive? - No - Patient declined No - Patient declined No - Patient declined No - Patient declined - Yes (MAU/Ambulatory/Procedural Areas -  Information given)  Copy of [place] in Chart? - - No - copy requested - - No - copy requested -  Would patient like information on creating a medical advance directive? No - Patient declined - - - - - -    Tobacco Social History   Tobacco Use  Smoking Status Never Smoker  Smokeless Tobacco Never Used     Counseling given: Not Answered   Clinical Intake: Pain : No/denies pain     Past Medical History:  Diagnosis Date  . Anxiety   . Arthritis    back  . Asthma   . Depression   . Endometrial cancer ([place]) 2015  . Family history of breast cancer   . Family history of colon cancer   . Family history of ovarian cancer   . GERD (gastroesophageal reflux disease)   . History of colonic diverticulitis   . Hx of colonic polyps   . Hyperlipidemia   . Hypertension   . 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  . Osteopenia 09/2017   T score -1.2 FRAX 3.2% / 0.3%  . Personal history of chemotherapy 2015  . Radiation 12/17/14-01/03/15   vaginal brachytherapy  . Thyroid disease 06/01/2013   Past Surgical History:  Procedure Laterality Date  . ABDOMINAL HYSTERECTOMY  06/23/14   [facility], TRH/BSO  . CHOLECYSTECTOMY  1991  . DILATION AND CURETTAGE OF UTERUS    . IR REMOVAL TUN ACCESS W/ PORT W/O FL MOD SED  04/30/2017  . TUBAL LIGATION     Family History  Problem Relation Age  of Onset  . Heart disease Maternal Aunt        x 3 -2 brothers  . Hypertension Maternal Aunt   . Breast cancer Maternal Aunt        maternal half; dx in her 43s  . Cancer Maternal Aunt        cervical  . Ovarian cancer Maternal Aunt        dx in her 31s  . Prostate cancer Father 37  . Diabetes Father   . Cancer Father        lung cancer, asbestos exposure  . Heart disease Brother   . Diabetes Brother   . Diabetes Mother   . Lupus Mother   . Heart disease Brother   . Hyperlipidemia Sister   . Heart disease Brother   . Diabetes Brother   . Colon polyps Brother   . Heart disease Brother    . Diabetes Brother   . Colon polyps Brother   . Colon cancer Maternal Grandmother        dx in her 75s  . Leukemia Maternal Uncle 21  . Cancer Paternal Aunt        NOS- breast   . Prostate cancer Paternal Uncle        dx in his 36s  . Breast cancer Other        dx in her 86s   Social History   Socioeconomic History  . Marital status: Married    Spouse name: Not on file  . Number of children: 3  . Years of education: Not on file  . Highest education level: Not on file  Occupational History  . Occupation: retired Network engineer  Tobacco Use  . Smoking status: Never Smoker  . Smokeless tobacco: Never Used  Substance and Sexual Activity  . Alcohol use: No    Alcohol/week: 0.0 standard drinks  . Drug use: No  . Sexual activity: Yes    Partners: Male    Comment: 1st intercourse- 89, partners- 41, married- 60 yrs   Other Topics Concern  . Not on file  Social History Narrative   Married- 82 years   Never Smoked   Alcohol use-no   Drug use-no   Occupation: housewife   Caffeine use/day:  None   Does Patient Exercise:  yes   Social Determinants of Health   Financial Resource Strain: Low Risk   . Difficulty of Paying Living Expenses: Not hard at all  Food Insecurity: No Food Insecurity  . Worried About Charity fundraiser in the Last Year: Never true  . Ran Out of Food in the Last Year: Never true  Transportation Needs: No Transportation Needs  . Lack of Transportation (Medical): No  . Lack of Transportation (Non-Medical): No  Physical Activity:   . Days of Exercise per Week:   . Minutes of Exercise per Session:   Stress:   . Feeling of Stress :   Social Connections:   . Frequency of Communication with Friends and Family:   . Frequency of Social Gatherings with Friends and Family:   . Attends Religious Services:   . Active Member of Clubs or Organizations:   . Attends Archivist Meetings:   Marland Kitchen Marital Status:     Outpatient Encounter Medications as of  12/17/2019  Medication Sig  . albuterol (PROAIR HFA) 108 (90 Base) MCG/ACT inhaler 2 puffs every 4 hours as needed for coughing or wheezing spells  . albuterol (PROVENTIL) (2.5 MG/3ML) 0.083% nebulizer solution  Take 3 mLs (2.5 mg total) by nebulization every 4 (four) hours as needed for wheezing or shortness of breath.  Marland Kitchen amLODipine (NORVASC) 10 MG tablet Take 1 tablet (10 mg total) by mouth daily.  Marland Kitchen aspirin 81 MG tablet Take 81 mg by mouth daily.    Marland Kitchen atorvastatin (LIPITOR) 40 MG tablet Take 1 tablet (40 mg total) by mouth daily.  . B Complex CAPS Take by mouth.  . Baclofen 5 MG TABS Take 5 mg by mouth 2 (two) times daily as needed.  . benazepril (LOTENSIN) 20 MG tablet Take 1 tablet (20 mg total) by mouth at bedtime.  . betamethasone valerate ointment (VALISONE) 0.1 % Apply 1 application topically 2 (two) times daily.  . budesonide-formoterol (SYMBICORT) 160-4.5 MCG/ACT inhaler Inhale 2 puffs into the lungs 2 (two) times daily.  . carvedilol (COREG) 12.5 MG tablet Take 1 tablet (12.5 mg total) by mouth 2 (two) times daily with a meal.  . Diclofenac Sodium 3 % GEL Apply topically.  . famotidine (PEPCID) 20 MG tablet Take 1 tablet (20 mg total) by mouth 2 (two) times daily.  . furosemide (LASIX) 20 MG tablet Take 1 tablet (20 mg total) by mouth daily.  Marland Kitchen ketoconazole (NIZORAL) 2 % cream APPLY 1 APPLICATION TOPICALLY ONCE DAILY BETWEEN TOES.  Marland Kitchen levocetirizine (XYZAL) 5 MG tablet Take 1 tablet (5 mg total) by mouth every evening.  . Multiple Vitamin (MULTIVITAMIN) tablet Take 1 tablet by mouth daily.  Marland Kitchen EPINEPHrine (EPIPEN 2-PAK) 0.3 mg/0.3 mL IJ SOAJ injection Use as directed for severe allergic reaction (Patient not taking: Reported on 12/17/2019)  . [DISCONTINUED] diazepam (VALIUM) 5 MG tablet Please take 30 minutes prior to procedure. May repeat x 1 if needed.  . [DISCONTINUED] predniSONE (DELTASONE) 50 MG tablet TAKE 1 TABLET BY MOUTH AT 7:30PM THEN TAKE 1 TABLET AT 1:30AM THEN TAKE 1 TABLET  AT 7:30AM TAKE WITH 50 MG OF BENADRYL   Facility-Administered Encounter Medications as of 12/17/2019  Medication  . 0.9 %  sodium chloride infusion  . alteplase (CATHFLO ACTIVASE) injection 2 mg  . heparin lock flush 100 unit/mL  . sodium chloride flush (NS) 0.9 % injection 10 mL    Activities of Daily Living In your present state of health, do you have any difficulty performing the following activities: 12/17/2019  Hearing? N  Vision? N  Difficulty concentrating or making decisions? N  Walking or climbing stairs? N  Dressing or bathing? N  Doing errands, shopping? N  Preparing Food and eating ? N  Using the Toilet? N  In the past six months, have you accidently leaked urine? N  Do you have problems with loss of bowel control? N  Managing your Medications? N  Managing your Finances? N  Housekeeping or managing your Housekeeping? N  Some recent data might be hidden    Patient Care Team: Shelda Pal, DO as PCP - General (Family Medicine) Suella Broad, MD as Consulting Physician (Physical Medicine and Rehabilitation) Gordy Levan, MD as Consulting Physician (Oncology) Janie Morning, MD as Attending Physician (Obstetrics and Gynecology) Eppie Gibson, MD as Attending Physician (Radiation Oncology) Rosemarie Ax, MD as Consulting Physician (Family Medicine) Princess Bruins, MD as Consulting Physician (Obstetrics and Gynecology)    Assessment:   This is a routine wellness examination for [place]. Physical assessment deferred to PCP.  Exercise Activities and Dietary recommendations Current Exercise Habits: The patient does not participate in regular exercise at present, Exercise limited by: orthopedic condition(s)(back pain)  Diet (meal preparation, eat out, water intake, caffeinated beverages, dairy products, fruits and vegetables): 24 hr recall Breakfast: egg, tenderloin, toast, grits Lunch: fruit Dinner:  Fried fish, slaw, hush puppies  Goals     . Increase physical activity     Continue chair exercises and maybe implement Walk Away the Pounds [name]     . maintain health (pt-stated)    . Reduce portion size       Fall Risk Fall Risk  12/17/2019 12/16/2018 09/08/2018 09/09/2017 09/26/2016  Falls in the past year? 0 0 0 No No  Number falls in past yr: 0 - 0 - -  Injury with Fall? 0 - 0 - -  Risk for fall due to : - - - - -  Risk for fall due to: Comment - - - - -  Follow up Education provided;Falls prevention discussed - - - -   Depression Screen PHQ 2/9 Scores 12/17/2019 12/16/2018 09/09/2017 09/26/2016  PHQ - 2 Score 0 0 0 0     Cognitive Function Ad8 score reviewed for issues:  Issues making decisions:no  Less interest in hobbies / activities:no  Repeats questions, stories (family complaining):no  Trouble using ordinary gadgets (microwave, computer, phone):no  Forgets the month or year: no  Mismanaging finances: no  Remembering appts:no  Daily problems with thinking and/or memory:no Ad8 score is=0     MMSE - Mini Mental State Exam 09/09/2017  Orientation to time 5  Orientation to Place 5  Registration 3  Attention/ Calculation 5  Recall 3  Language- name 2 objects 2  Language- repeat 1  Language- follow 3 step command 3  Language- read & follow direction 1  Write a sentence 1  Copy design 1  Total score 30        Immunization History  Administered Date(s) Administered  . Fluad Quad(high Dose 65+) 04/30/2019  . Influenza Split 06/11/2011, 05/21/2012  . Influenza Whole 06/24/2007, 04/16/2008, 05/02/2010  . Influenza,inj,Quad PF,6+ Mos 06/01/2013, 05/19/2015, 03/30/2016, 05/16/2018  . Influenza-Unspecified 05/23/2014, 05/21/2017  . PFIZER SARS-COV-2 Vaccination 08/29/2019, 09/19/2019  . Pneumococcal Conjugate-13 05/16/2018  . Pneumococcal Polysaccharide-23 09/24/2016  . Td 09/26/2007  . Tdap 05/16/2018    Screening Tests Health Maintenance  Topic Date Due  . INFLUENZA VACCINE   02/21/2020  . FOOT EXAM  03/12/2020  . HEMOGLOBIN A1C  03/31/2020  . OPHTHALMOLOGY EXAM  10/07/2020  . PNA vac Low Risk Adult (2 of 2 - PPSV23) 09/24/2021  . COLONOSCOPY  10/24/2021  . MAMMOGRAM  12/03/2021  . TETANUS/TDAP  05/16/2028  . DEXA SCAN  Completed  . COVID-19 Vaccine  Completed  . Hepatitis C Screening  Completed      Plan:    Please schedule your next medicare wellness visit with me in 1 yr.  Continue to eat heart healthy diet (full of fruits, vegetables, whole grains, lean protein, water--limit salt, fat, and sugar intake) and increase physical activity as tolerated.  Continue doing brain stimulating activities (puzzles, reading, adult coloring books, staying active) to keep memory sharp.     I have personally reviewed and noted the following in the patient's chart:   . Medical and social history . Use of alcohol, tobacco or illicit drugs  . Current medications and supplements . Functional ability and status . Nutritional status . Physical activity . Advanced directives . List of other physicians . Hospitalizations, surgeries, and ER visits in previous 12 months . Vitals . Screenings to include cognitive, depression, and falls . Referrals  and appointments  In addition, I have reviewed and discussed with patient certain preventive protocols, quality metrics, and best practice recommendations. A written personalized care plan for preventive services as well as general preventive health recommendations were provided to patient.     Naaman Plummer [place], [place]  12/17/2019

## 2019-12-17 ENCOUNTER — Ambulatory Visit (INDEPENDENT_AMBULATORY_CARE_PROVIDER_SITE_OTHER): Payer: Medicare Other | Admitting: *Deleted

## 2019-12-17 ENCOUNTER — Encounter: Payer: Self-pay | Admitting: *Deleted

## 2019-12-17 ENCOUNTER — Other Ambulatory Visit: Payer: Self-pay

## 2019-12-17 DIAGNOSIS — Z0000 Encounter for general adult medical examination without abnormal findings: Secondary | ICD-10-CM | POA: Diagnosis not present

## 2019-12-17 NOTE — Patient Instructions (Signed)
Please schedule your next medicare wellness visit with me in 1 yr.  Continue to eat heart healthy diet (full of fruits, vegetables, whole grains, lean protein, water--limit salt, fat, and sugar intake) and increase physical activity as tolerated.  Continue doing brain stimulating activities (puzzles, reading, adult coloring books, staying active) to keep memory sharp.    Katelyn Fisher , Thank you for taking time to come for your Medicare Wellness Visit. I appreciate your ongoing commitment to your health goals. Please review the following plan we discussed and let me know if I can assist you in the future.   These are the goals we discussed: Goals    . Increase physical activity     Continue chair exercises and maybe implement Walk Away the Pounds [name]     . maintain health (pt-stated)    . Reduce portion size       This is a list of the screening recommended for you and due dates:  Health Maintenance  Topic Date Due  . Flu Shot  02/21/2020  . Complete foot exam   03/12/2020  . Hemoglobin A1C  03/31/2020  . Eye exam for diabetics  10/07/2020  . Pneumonia vaccines (2 of 2 - PPSV23) 09/24/2021  . Colon Cancer Screening  10/24/2021  . Mammogram  12/03/2021  . Tetanus Vaccine  05/16/2028  . DEXA scan (bone density measurement)  Completed  . COVID-19 Vaccine  Completed  .  Hepatitis C: One time screening is recommended by Center for Disease Control  (CDC) for  adults born from [birth year] through 1964.   Completed    Preventive Care 68 Years and Older, Female Preventive care refers to lifestyle choices and visits with your health care provider that can promote health and wellness. This includes:  A yearly physical exam. This is also called an annual well check.  Regular dental and eye exams.  Immunizations.  Screening for certain conditions.  Healthy lifestyle choices, such as diet and exercise. What can I expect for my preventive care visit? Physical exam Your health  care provider will check:  Height and weight. These may be used to calculate body mass index (BMI), which is a measurement that tells if you are at a healthy weight.  Heart rate and blood pressure.  Your skin for abnormal spots. Counseling Your health care provider may ask you questions about:  Alcohol, tobacco, and drug use.  Emotional well-being.  Home and relationship well-being.  Sexual activity.  Eating habits.  History of falls.  Memory and ability to understand (cognition).  Work and work Statistician.  Pregnancy and menstrual history. What immunizations do I need?  Influenza (flu) vaccine  This is recommended every year. Tetanus, diphtheria, and pertussis (Tdap) vaccine  You may need a Td booster every 10 years. Varicella (chickenpox) vaccine  You may need this vaccine if you have not already been vaccinated. Zoster (shingles) vaccine  You may need this after age 68. Pneumococcal conjugate (PCV13) vaccine  One dose is recommended after age 68. Pneumococcal polysaccharide (PPSV23) vaccine  One dose is recommended after age 68. Measles, mumps, and rubella (MMR) vaccine  You may need at least one dose of MMR if you were born in 1957 or later. You may also need a second dose. Meningococcal conjugate (MenACWY) vaccine  You may need this if you have certain conditions. Hepatitis A vaccine  You may need this if you have certain conditions or if you travel or work in places where you may be  exposed to hepatitis A. Hepatitis B vaccine  You may need this if you have certain conditions or if you travel or work in places where you may be exposed to hepatitis B. Haemophilus influenzae type b (Hib) vaccine  You may need this if you have certain conditions. You may receive vaccines as individual doses or as more than one vaccine together in one shot (combination vaccines). Talk with your health care provider about the risks and benefits of combination  vaccines. What tests do I need? Blood tests  Lipid and cholesterol levels. These may be checked every 5 years, or more frequently depending on your overall health.  Hepatitis C test.  Hepatitis B test. Screening  Lung cancer screening. You may have this screening every year starting at age 68 if you have a 30-pack-year history of smoking and currently smoke or have quit within the past 15 years.  Colorectal cancer screening. All adults should have this screening starting at age 68 and continuing until age 49. Your health care provider may recommend screening at age 24 if you are at increased risk. You will have tests every 1-10 years, depending on your results and the type of screening test.  Diabetes screening. This is done by checking your blood sugar (glucose) after you have not eaten for a while (fasting). You may have this done every 1-3 years.  Mammogram. This may be done every 1-2 years. Talk with your health care provider about how often you should have regular mammograms.  BRCA-related cancer screening. This may be done if you have a family history of breast, ovarian, tubal, or peritoneal cancers. Other tests  Sexually transmitted disease (STD) testing.  Bone density scan. This is done to screen for osteoporosis. You may have this done starting at age 68. Follow these instructions at home: Eating and drinking  Eat a diet that includes fresh fruits and vegetables, whole grains, lean protein, and low-fat dairy products. Limit your intake of foods with high amounts of sugar, saturated fats, and salt.  Take vitamin and mineral supplements as recommended by your health care provider.  Do not drink alcohol if your health care provider tells you not to drink.  If you drink alcohol: ? Limit how much you have to 0-1 drink a day. ? Be aware of how much alcohol is in your drink. In the [place] one drink equals one 12 oz bottle of beer (355 mL), one 5 oz glass of wine (148 mL), or one  1 oz glass of hard liquor (44 mL). Lifestyle  Take daily care of your teeth and gums.  Stay active. Exercise for at least 30 minutes on 5 or more days each week.  Do not use any products that contain nicotine or tobacco, such as cigarettes, e-cigarettes, and chewing tobacco. If you need help quitting, ask your health care provider.  If you are sexually active, practice safe sex. Use a condom or other form of protection in order to prevent STIs (sexually transmitted infections).  Talk with your health care provider about taking a low-dose aspirin or statin. What's next?  Go to your health care provider once a year for a well check visit.  Ask your health care provider how often you should have your eyes and teeth checked.  Stay up to date on all vaccines. This information is not intended to replace advice given to you by your health care provider. Make sure you discuss any questions you have with your health care provider. Document Revised: 07/03/2018  Document Reviewed: 07/03/2018 Elsevier Patient Education  [employer].

## 2020-01-04 DIAGNOSIS — M25551 Pain in right hip: Secondary | ICD-10-CM | POA: Diagnosis not present

## 2020-01-13 DIAGNOSIS — I8310 Varicose veins of unspecified lower extremity with inflammation: Secondary | ICD-10-CM | POA: Diagnosis not present

## 2020-01-13 MED FILL — BETAMETHASONE VALER 0.1% OI: 0.1 | 30 days supply | Qty: 90 | Fill #0

## 2020-02-03 DIAGNOSIS — M25551 Pain in right hip: Secondary | ICD-10-CM | POA: Diagnosis not present

## 2020-02-09 ENCOUNTER — Encounter: Payer: Self-pay | Admitting: Family Medicine

## 2020-02-09 ENCOUNTER — Ambulatory Visit (INDEPENDENT_AMBULATORY_CARE_PROVIDER_SITE_OTHER): Payer: Medicare Other | Admitting: Family Medicine

## 2020-02-09 ENCOUNTER — Ambulatory Visit: Payer: Medicare Other | Admitting: Family Medicine

## 2020-02-09 ENCOUNTER — Other Ambulatory Visit: Payer: Self-pay

## 2020-02-09 VITALS — BP 118/80 | HR 87 | Temp 98.1°F | Ht 62.0 in | Wt 245.5 lb

## 2020-02-09 DIAGNOSIS — H8111 Benign paroxysmal vertigo, right ear: Secondary | ICD-10-CM

## 2020-02-09 NOTE — Progress Notes (Signed)
Chief Complaint  Patient presents with  . Dizziness    Started yesterday    Katelyn Fisher is 69 y.o. pt here for dizziness.  Duration: 1 day Pass out? No Spinning? Yes; lasted a few seconds after turning over in bed to the R Recent illness/fever? No Headache? No Neurologic signs? No Change in PO intake? No  Palpitations? No  Past Medical History:  Diagnosis Date  . Anxiety   . Arthritis    back  . Asthma   . Depression   . Endometrial cancer ([place]) 2015  . Family history of breast cancer   . Family history of colon cancer   . Family history of ovarian cancer   . GERD (gastroesophageal reflux disease)   . History of colonic diverticulitis   . Hx of colonic polyps   . Hyperlipidemia   . Hypertension   . 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  . Osteopenia 09/2017   T score -1.2 FRAX 3.2% / 0.3%  . Personal history of chemotherapy 2015  . Radiation 12/17/14-01/03/15   vaginal brachytherapy  . Thyroid disease 06/01/2013      Allergies as of 02/09/2020      Reactions   Shellfish Allergy Hives, Shortness Of Breath, Swelling   Oxycodone Swelling   Facial swelling and tightness   Penicillins Swelling   Rash with welps   Iodinated Diagnostic Agents Rash   Sulfa Antibiotics Rash      Medication List       Accurate as of February 09, 2020  9:52 AM. If you have any questions, ask your nurse or doctor.        STOP taking these medications   ketoconazole 2 % cream Commonly known as: NIZORAL Stopped by: Shelda Pal, DO     TAKE these medications   albuterol 108 (90 Base) MCG/ACT inhaler Commonly known as: ProAir HFA 2 puffs every 4 hours as needed for coughing or wheezing spells   albuterol (2.5 MG/3ML) 0.083% nebulizer solution Commonly known as: PROVENTIL Take 3 mLs (2.5 mg total) by nebulization every 4 (four) hours as needed for wheezing or shortness of breath.   amLODipine 10 MG tablet Commonly known as: NORVASC Take 1 tablet (10 mg total) by mouth  daily.   aspirin 81 MG tablet Take 81 mg by mouth daily.   atorvastatin 40 MG tablet Commonly known as: LIPITOR Take 1 tablet (40 mg total) by mouth daily.   B Complex Caps Take by mouth.   Baclofen 5 MG Tabs Take 5 mg by mouth 2 (two) times daily as needed.   benazepril 20 MG tablet Commonly known as: LOTENSIN Take 1 tablet (20 mg total) by mouth at bedtime.   betamethasone valerate ointment 0.1 % Commonly known as: VALISONE Apply 1 application topically 2 (two) times daily.   budesonide-formoterol 160-4.5 MCG/ACT inhaler Commonly known as: SYMBICORT Inhale 2 puffs into the lungs 2 (two) times daily.   carvedilol 12.5 MG tablet Commonly known as: COREG Take 1 tablet (12.5 mg total) by mouth 2 (two) times daily with a meal.   Diclofenac Sodium 3 % Gel Apply topically.   EPINEPHrine 0.3 mg/0.3 mL Soaj injection Commonly known as: EpiPen 2-Pak Use as directed for severe allergic reaction   famotidine 20 MG tablet Commonly known as: PEPCID Take 1 tablet (20 mg total) by mouth 2 (two) times daily.   furosemide 20 MG tablet Commonly known as: LASIX Take 1 tablet (20 mg total) by mouth daily.   levocetirizine 5 MG tablet Commonly  known as: XYZAL Take 1 tablet (5 mg total) by mouth every evening.   multivitamin tablet Take 1 tablet by mouth daily.       BP 118/80 (BP Location: Left Arm, Patient Position: Sitting, Cuff Size: Large)   Pulse 87   Temp 98.1 F (36.7 C) (Oral)   Ht 5\' 2"  (1.575 m)   Wt 245 lb 8 oz (111.4 kg)   SpO2 98%   BMI 44.90 kg/m  General: Awake, alert, appears stated age Eyes: PERRLA, EOMi Ears: Patent, TM's neg b/l Heart: RRR, no murmurs, no carotid bruits Lungs: CTAB, no accessory muscle use MSK: 5/5 strength throughout, gait normal Neuro: No cerebellar signs, patellar reflex 1/4 b/l wo clonus, calcaneal reflex 0/4 b/l wo clonus, biceps reflex 1/4 b/l wo clonus; Dix-Hall-Pike negative b/l. Psych: Age appropriate judgment and  insight, normal mood and affect  Benign paroxysmal positional vertigo of right ear  Epley man info given. Resolved currently. BP OK.  F/u prn. Pt voiced understanding and agreement to the plan.  [place], DO 02/09/20 9:52 AM

## 2020-02-09 NOTE — Patient Instructions (Addendum)
Keep monitoring your blood pressure at home.  Keep the diet clean and stay active.  Stay hydrated.   Let us know if you need anything.  How to Perform the Epley Maneuver The Epley maneuver is an exercise that relieves symptoms of vertigo. Vertigo is the feeling that you or your surroundings are moving when they are not. When you feel vertigo, you may feel like the room is spinning and have trouble walking. Dizziness is a little different than vertigo. When you are dizzy, you may feel unsteady or light-headed. You can do this maneuver at home whenever you have symptoms of vertigo. You can do it up to 3 times a day until your symptoms go away. Even though the Epley maneuver may relieve your vertigo for a few weeks, it is possible that your symptoms will return. This maneuver relieves vertigo, but it does not relieve dizziness. What are the risks? If it is done correctly, the Epley maneuver is considered safe. Sometimes it can lead to dizziness or nausea that goes away after a short time. If you develop other symptoms, such as changes in vision, weakness, or numbness, stop doing the maneuver and call your health care provider. How to perform the Epley maneuver 1. Sit on the edge of a bed or table with your back straight and your legs extended or hanging over the edge of the bed or table. 2. Turn your head halfway toward the affected ear or side. 3. Lie backward quickly with your head turned until you are lying flat on your back. You may want to position a pillow under your shoulders. 4. Hold this position for 30 seconds. You may experience an attack of vertigo. This is normal. 5. Turn your head to the opposite direction until your unaffected ear is facing the floor. 6. Hold this position for 30 seconds. You may experience an attack of vertigo. This is normal. Hold this position until the vertigo stops. 7. Turn your whole body to the same side as your head. Hold for another 30 seconds. 8. Sit back  up. You can repeat this exercise up to 3 times a day. Follow these instructions at home:  After doing the Epley maneuver, you can return to your normal activities.  Ask your health care provider if there is anything you should do at home to prevent vertigo. He or she may recommend that you: ? Keep your head raised (elevated) with two or more pillows while you sleep. ? Do not sleep on the side of your affected ear. ? Get up slowly from bed. ? Avoid sudden movements during the day. ? Avoid extreme head movement, like looking up or bending over. Contact a health care provider if:  Your vertigo gets worse.  You have other symptoms, including: ? Nausea. ? Vomiting. ? Headache. Get help right away if:  You have vision changes.  You have a severe or worsening headache or neck pain.  You cannot stop vomiting.  You have new numbness or weakness in any part of your body. Summary  Vertigo is the feeling that you or your surroundings are moving when they are not.  The Epley maneuver is an exercise that relieves symptoms of vertigo.  If the Epley maneuver is done correctly, it is considered safe. You can do it up to 3 times a day. This information is not intended to replace advice given to you by your health care provider. Make sure you discuss any questions you have with your health care provider.  Document Revised: 06/21/2017 Document Reviewed: 05/29/2016 Elsevier Patient Education  2020 [employer].

## 2020-02-10 ENCOUNTER — Ambulatory Visit: Payer: Medicare Other | Admitting: Family Medicine

## 2020-02-12 ENCOUNTER — Other Ambulatory Visit: Payer: Self-pay | Admitting: Family Medicine

## 2020-02-12 DIAGNOSIS — E1169 Type 2 diabetes mellitus with other specified complication: Secondary | ICD-10-CM

## 2020-03-05 DIAGNOSIS — M25551 Pain in right hip: Secondary | ICD-10-CM | POA: Diagnosis not present

## 2020-04-01 ENCOUNTER — Other Ambulatory Visit: Payer: Self-pay

## 2020-04-01 ENCOUNTER — Ambulatory Visit (INDEPENDENT_AMBULATORY_CARE_PROVIDER_SITE_OTHER): Payer: Medicare Other | Admitting: Family Medicine

## 2020-04-01 ENCOUNTER — Encounter: Payer: Self-pay | Admitting: Family Medicine

## 2020-04-01 VITALS — BP 120/70 | HR 91 | Temp 98.0°F | Ht 62.0 in | Wt 249.0 lb

## 2020-04-01 DIAGNOSIS — B353 Tinea pedis: Secondary | ICD-10-CM

## 2020-04-01 DIAGNOSIS — E1169 Type 2 diabetes mellitus with other specified complication: Secondary | ICD-10-CM

## 2020-04-01 DIAGNOSIS — I10 Essential (primary) hypertension: Secondary | ICD-10-CM

## 2020-04-01 DIAGNOSIS — E669 Obesity, unspecified: Secondary | ICD-10-CM

## 2020-04-01 MED ORDER — KETOCONAZOLE 2 % EX CREA: 1.0000 "application " | TOPICAL_CREAM | Freq: Every day | CUTANEOUS | 0 refills | Status: AC

## 2020-04-01 MED FILL — KETOCONAZOLE 2% CREAM: 2 | 30 days supply | Qty: 30 | Fill #0

## 2020-04-01 NOTE — Progress Notes (Signed)
Subjective:   Chief Complaint  Patient presents with  . Follow-up    Katelyn Fisher is a 69 y.o. female here for follow-up of diabetes.   Katelyn Fisher does not monitor her sugars at home.  Patient does not require insulin.   Medications include: diet controlled Diet is healthy.  Exercise: walking  Hypertension Patient presents for hypertension follow up. She does monitor home blood pressures. Blood pressures ranging on average from 120's/70's. She is compliant with medications- Norvasc 10 mg/d, Coreg 12.5 mg bid, Lotensin 20 mg/d Patient has these side effects of medication: none Diet/exercise as above.   Past Medical History:  Diagnosis Date  . Anxiety   . Arthritis    back  . Asthma   . Depression   . Endometrial cancer ([place]) 2015  . Family history of breast cancer   . Family history of colon cancer   . Family history of ovarian cancer   . GERD (gastroesophageal reflux disease)   . History of colonic diverticulitis   . Hx of colonic polyps   . Hyperlipidemia   . Hypertension   . 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  . Osteopenia 09/2017   T score -1.2 FRAX 3.2% / 0.3%  . Personal history of chemotherapy 2015  . Radiation 12/17/14-01/03/15   vaginal brachytherapy  . Thyroid disease 06/01/2013     Related testing: Retinal exam: Complete Pneumovax: Done  Objective:  BP 120/70 (BP Location: Left Arm, Patient Position: Sitting, Cuff Size: Large)   Pulse 91   Temp 98 F (36.7 C) (Oral)   Ht 5\' 2"  (1.575 m)   Wt 249 lb (112.9 kg)   SpO2 98%   BMI 45.54 kg/m  General:  Well developed, well nourished, in no apparent distress Skin:  Some macerated tissue between 3rd/4th and 4th/5th digits on L ft Head:  Normocephalic, atraumatic Eyes:  Pupils equal and round, sclera anicteric without injection  Lungs:  CTAB, no access msc use Cardio:  RRR, no bruits, no LE edema Musculoskeletal:  Symmetrical muscle groups noted without atrophy or deformity Neuro:  Sensation mostly  intact to pinprick on feet Psych: Age appropriate judgment and insight  Assessment:   Diabetes mellitus type 2 in obese ([place]) - Plan: Hemoglobin A1c, CANCELED: Hemoglobin A1c  Tinea pedis of left foot - Plan: ketoconazole (NIZORAL) 2 % cream  Essential hypertension   Plan:   Counseled on diet and exercise. F/u in 6 mo. The patient voiced understanding and agreement to the plan.  [place], DO 04/01/20 11:42 AM

## 2020-04-01 NOTE — Patient Instructions (Signed)
Give us 2-3 business days to get the results of your labs back.   Keep the diet clean and stay active.  I recommend getting the flu shot in mid October. This suggestion would change if the CDC comes out with a different recommendation.   Let us know if you need anything. 

## 2020-04-04 ENCOUNTER — Other Ambulatory Visit: Payer: Medicare Other

## 2020-04-05 ENCOUNTER — Ambulatory Visit: Payer: Medicare Other | Admitting: Family Medicine

## 2020-04-05 DIAGNOSIS — M25551 Pain in right hip: Secondary | ICD-10-CM | POA: Diagnosis not present

## 2020-04-07 ENCOUNTER — Other Ambulatory Visit: Payer: Medicare Other

## 2020-04-07 ENCOUNTER — Other Ambulatory Visit: Payer: Self-pay

## 2020-04-07 DIAGNOSIS — E1169 Type 2 diabetes mellitus with other specified complication: Secondary | ICD-10-CM | POA: Diagnosis not present

## 2020-04-08 LAB — HEMOGLOBIN A1C
Hgb A1c MFr Bld: 7.1 % of total Hgb — ABNORMAL HIGH (ref <5.7)
Mean Plasma Glucose: 157 (calc)
eAG (mmol/L): 8.7 (calc)

## 2020-04-20 DIAGNOSIS — M5136 Other intervertebral disc degeneration, lumbar region: Secondary | ICD-10-CM | POA: Diagnosis not present

## 2020-04-20 DIAGNOSIS — M48062 Spinal stenosis, lumbar region with neurogenic claudication: Secondary | ICD-10-CM | POA: Diagnosis not present

## 2020-05-05 DIAGNOSIS — M25551 Pain in right hip: Secondary | ICD-10-CM | POA: Diagnosis not present

## 2020-05-05 DIAGNOSIS — I8310 Varicose veins of unspecified lower extremity with inflammation: Secondary | ICD-10-CM | POA: Diagnosis not present

## 2020-05-11 ENCOUNTER — Other Ambulatory Visit: Payer: Self-pay

## 2020-05-11 ENCOUNTER — Telehealth: Payer: Self-pay | Admitting: Family Medicine

## 2020-05-11 ENCOUNTER — Ambulatory Visit (INDEPENDENT_AMBULATORY_CARE_PROVIDER_SITE_OTHER): Payer: Medicare Other

## 2020-05-11 DIAGNOSIS — Z23 Encounter for immunization: Secondary | ICD-10-CM | POA: Diagnosis not present

## 2020-05-11 NOTE — Telephone Encounter (Signed)
Form completed/signed by PCP Called the patient informed parking form done.

## 2020-05-11 NOTE — Telephone Encounter (Signed)
Pt dropped off Parking Placard form to get filled out by [employer].   Pt would like to be called when paper is ready to be picked up [phone]  Paperwork is in Bode bin up front

## 2020-05-12 ENCOUNTER — Encounter: Payer: Self-pay | Admitting: Allergy and Immunology

## 2020-05-12 ENCOUNTER — Ambulatory Visit (INDEPENDENT_AMBULATORY_CARE_PROVIDER_SITE_OTHER): Payer: Medicare Other | Admitting: Allergy and Immunology

## 2020-05-12 VITALS — BP 140/90 | HR 81 | Temp 97.2°F | Resp 18

## 2020-05-12 DIAGNOSIS — J4540 Moderate persistent asthma, uncomplicated: Secondary | ICD-10-CM

## 2020-05-12 DIAGNOSIS — T7800XD Anaphylactic reaction due to unspecified food, subsequent encounter: Secondary | ICD-10-CM

## 2020-05-12 DIAGNOSIS — J309 Allergic rhinitis, unspecified: Secondary | ICD-10-CM | POA: Diagnosis not present

## 2020-05-12 DIAGNOSIS — K219 Gastro-esophageal reflux disease without esophagitis: Secondary | ICD-10-CM | POA: Diagnosis not present

## 2020-05-12 NOTE — Assessment & Plan Note (Signed)
   Continue meticulous avoidance of shellfish and have access to epinephrine autoinjector 2 pack in case of accidental ingestion.  Food allergy action plan is in place.

## 2020-05-12 NOTE — Assessment & Plan Note (Signed)
   Continue appropriate reflux lifestyle modifications and famotidine (Pepcid if needed.

## 2020-05-12 NOTE — Assessment & Plan Note (Signed)
Stable.  For now, continue Symbicort 160 g, 1 inhalation via spacer device twice daily.  Continue albuterol every 4-6 hours if needed.  Subjective and objective measures of pulmonary function will be followed and the treatment plan will be adjusted accordingly.

## 2020-05-12 NOTE — Patient Instructions (Addendum)
Moderate persistent asthma without complication Stable.  For now, continue Symbicort 160 g, 1 inhalation via spacer device twice daily.  Continue albuterol every 4-6 hours if needed.  Subjective and objective measures of pulmonary function will be followed and the treatment plan will be adjusted accordingly.  Allergic rhinitis Well-controlled.  Continue appropriate allergen avoidance measures.  Continue cetirizine 10 mg daily if needed.  Continue fluticasone nasal spray, 1 to 2 sprays per nostril daily if needed.  Nasal saline spray (i.e. Simply Saline) is recommended prior to medicated nasal sprays and as needed.  Gastroesophageal reflux disease without esophagitis  Continue appropriate reflux lifestyle modifications and famotidine (Pepcid if needed.  Anaphylactic shock due to adverse food reaction  Continue meticulous avoidance of shellfish and have access to epinephrine autoinjector 2 pack in case of accidental ingestion.  Food allergy action plan is in place.   Return in about 5 months (around 10/10/2020), or if symptoms worsen or fail to improve.

## 2020-05-12 NOTE — Assessment & Plan Note (Signed)
Well-controlled.  Continue appropriate allergen avoidance measures.  Continue cetirizine 10 mg daily if needed.  Continue fluticasone nasal spray, 1 to 2 sprays per nostril daily if needed.  Nasal saline spray (i.e. Simply Saline) is recommended prior to medicated nasal sprays and as needed.

## 2020-05-12 NOTE — Progress Notes (Signed)
Follow-up Note  RE: Katelyn Fisher MRN: [id number] DOB: [birth year] Date of Office Visit: 05/12/2020  Primary care provider: Shelda Pal, DO Referring provider: Shelda Pal*  History of present illness: Katelyn Fisher is a 69 y.o. female with persistent asthma, allergic rhinitis, and food allergy presenting today for follow-up.  She was last seen in this clinic in April 2021.  She reports that in the interval since her previous visit her asthma has been stable.  She typically requires albuterol rescue 1-2 times per week on average, triggered by physical exertion/exercise and/or strong aromas.  She does not experience limitations in normal daily activities or nocturnal awakenings due to lower respiratory symptoms.  She is currently taking Symbicort 160 g, 1 inhalation via spacer device twice daily.  She reports that her nasal allergy symptoms have been well controlled with fluticasone nasal spray and/or cetirizine if needed.  Her reflux is well controlled with dietary control and famotidine when needed.  She has avoided shellfish in the interval since her previous visit without accidental ingestion.  Assessment and plan: Moderate persistent asthma without complication Stable.  For now, continue Symbicort 160 g, 1 inhalation via spacer device twice daily.  Continue albuterol every 4-6 hours if needed.  Subjective and objective measures of pulmonary function will be followed and the treatment plan will be adjusted accordingly.  Allergic rhinitis Well-controlled.  Continue appropriate allergen avoidance measures.  Continue cetirizine 10 mg daily if needed.  Continue fluticasone nasal spray, 1 to 2 sprays per nostril daily if needed.  Nasal saline spray (i.e. Simply Saline) is recommended prior to medicated nasal sprays and as needed.  Gastroesophageal reflux disease without esophagitis  Continue appropriate reflux lifestyle modifications and famotidine  (Pepcid if needed.  Anaphylactic shock due to adverse food reaction  Continue meticulous avoidance of shellfish and have access to epinephrine autoinjector 2 pack in case of accidental ingestion.  Food allergy action plan is in place.   No orders of the defined types were placed in this encounter.   Diagnostics: Spirometry:  Normal with an FEV1 of 88% predicted with an FEV1 ratio of 100%. This study was performed while the patient was asymptomatic.  Please see scanned spirometry results for details.    Physical examination: Blood pressure 140/90, pulse 81, temperature (!) 97.2 F (36.2 C), temperature source Temporal, resp. rate 18, SpO2 99 %.  General: Alert, interactive, in no acute distress. HEENT: TMs pearly gray, turbinates mildly edematous without discharge, post-pharynx unremarkable. Neck: Supple without lymphadenopathy. Lungs: Clear to auscultation without wheezing, rhonchi or rales. CV: Normal S1, S2 without murmurs. Skin: Warm and dry, without lesions or rashes.  The following portions of the patient's history were reviewed and updated as appropriate: allergies, current medications, past family history, past medical history, past social history, past surgical history and problem list.  Current Outpatient Medications  Medication Sig Dispense Refill  . albuterol (PROAIR HFA) 108 (90 Base) MCG/ACT inhaler 2 puffs every 4 hours as needed for coughing or wheezing spells 18 g 1  . albuterol (PROVENTIL) (2.5 MG/3ML) 0.083% nebulizer solution Take 3 mLs (2.5 mg total) by nebulization every 4 (four) hours as needed for wheezing or shortness of breath. 150 mL 2  . amLODipine (NORVASC) 10 MG tablet TAKE 1 TABLET BY MOUTH  DAILY 90 tablet 1  . aspirin 81 MG tablet Take 81 mg by mouth daily.      Marland Kitchen atorvastatin (LIPITOR) 40 MG tablet TAKE 1 TABLET BY MOUTH  DAILY 90  tablet 1  . B Complex CAPS Take by mouth.    . Baclofen 5 MG TABS Take 5 mg by mouth 2 (two) times daily as needed. 40  tablet 0  . benazepril (LOTENSIN) 20 MG tablet Take 1 tablet (20 mg total) by mouth at bedtime. 90 tablet 3  . betamethasone valerate ointment (VALISONE) 0.1 % Apply 1 application topically as needed.     . budesonide-formoterol (SYMBICORT) 160-4.5 MCG/ACT inhaler Inhale 2 puffs into the lungs as needed.     . carvedilol (COREG) 12.5 MG tablet TAKE 1 TABLET BY MOUTH  TWICE DAILY WITH A MEAL 180 tablet 1  . Diclofenac Sodium 3 % GEL Apply topically as needed.     Marland Kitchen EPINEPHrine (EPIPEN 2-PAK) 0.3 mg/0.3 mL IJ SOAJ injection Use as directed for severe allergic reaction 2 each 3  . famotidine (PEPCID) 20 MG tablet Take 1 tablet (20 mg total) by mouth 2 (two) times daily. (Patient taking differently: Take 20 mg by mouth as needed. ) 60 tablet 5  . furosemide (LASIX) 20 MG tablet TAKE 1 TABLET BY MOUTH  DAILY 90 tablet 1  . ketoconazole (NIZORAL) 2 % cream Apply 1 application topically daily. 30 g 0  . levocetirizine (XYZAL) 5 MG tablet Take 1 tablet (5 mg total) by mouth every evening. (Patient taking differently: Take 5 mg by mouth as needed. ) 90 tablet 2  . Multiple Vitamin (MULTIVITAMIN) tablet Take 1 tablet by mouth daily.     Current Facility-Administered Medications  Medication Dose Route Frequency Provider Last Rate Last Admin  . 0.9 %  sodium chloride infusion  500 mL Intravenous Continuous Pyrtle, Lajuan Lines, MD       Facility-Administered Medications Ordered in Other Visits  Medication Dose Route Frequency Provider Last Rate Last Admin  . alteplase (CATHFLO ACTIVASE) injection 2 mg  2 mg Intracatheter Once PRN Cross, Melissa D, NP      . heparin lock flush 100 unit/mL  500 Units Intravenous Once Cross, Melissa D, NP      . sodium chloride flush (NS) 0.9 % injection 10 mL  10 mL Intravenous PRN Cross, Melissa D, NP        Allergies  Allergen Reactions  . Shellfish Allergy Hives, Shortness Of Breath and Swelling  . Oxycodone Swelling    Facial swelling and tightness  . Penicillins  Swelling    Rash with welps  . Iodinated Diagnostic Agents Rash  . Sulfa Antibiotics Rash    I appreciate the opportunity to take part in Satine's care. Please do not hesitate to contact me with questions.  Sincerely,   R. Edgar Frisk, MD

## 2020-05-16 ENCOUNTER — Ambulatory Visit: Payer: Medicare Other | Attending: Internal Medicine

## 2020-05-16 ENCOUNTER — Other Ambulatory Visit (HOSPITAL_BASED_OUTPATIENT_CLINIC_OR_DEPARTMENT_OTHER): Payer: Self-pay | Admitting: Internal Medicine

## 2020-05-16 DIAGNOSIS — Z23 Encounter for immunization: Secondary | ICD-10-CM

## 2020-05-16 NOTE — Progress Notes (Signed)
° °  Covid-19 Vaccination Clinic  Name:  Katelyn Fisher    MRN: [id number] DOB: 1951-01-01  05/16/2020  Ms. Pongratz was observed post Covid-19 immunization for 15 minutes without incident. She was provided with Vaccine Information Sheet and instruction to access the V-Safe system. Vaccinated by Toney Sang.   Ms. Buccellato was instructed to call [phone] with any severe reactions post vaccine:  Difficulty breathing   Swelling of face and throat   A fast heartbeat   A bad rash all over body   Dizziness and weakness

## 2020-05-20 MED FILL — PFIZER-BIONTECH COVID-19 VA: 30 | 1 days supply | Qty: 0 | Fill #0

## 2020-05-22 ENCOUNTER — Encounter: Payer: Self-pay | Admitting: Emergency Medicine

## 2020-05-22 ENCOUNTER — Emergency Department (INDEPENDENT_AMBULATORY_CARE_PROVIDER_SITE_OTHER)
Admission: EM | Admit: 2020-05-22 | Discharge: 2020-05-22 | Disposition: A | Discharge status: Home / Self Care | Payer: Medicare Other | Source: Home / Self Care

## 2020-05-22 ENCOUNTER — Emergency Department (INDEPENDENT_AMBULATORY_CARE_PROVIDER_SITE_OTHER): Payer: Medicare Other

## 2020-05-22 DIAGNOSIS — I10 Essential (primary) hypertension: Secondary | ICD-10-CM

## 2020-05-22 DIAGNOSIS — W010XXA Fall on same level from slipping, tripping and stumbling without subsequent striking against object, initial encounter: Secondary | ICD-10-CM

## 2020-05-22 DIAGNOSIS — M79602 Pain in left arm: Secondary | ICD-10-CM

## 2020-05-22 DIAGNOSIS — M25522 Pain in left elbow: Secondary | ICD-10-CM | POA: Diagnosis not present

## 2020-05-22 DIAGNOSIS — M25532 Pain in left wrist: Secondary | ICD-10-CM | POA: Diagnosis not present

## 2020-05-22 DIAGNOSIS — M25512 Pain in left shoulder: Secondary | ICD-10-CM | POA: Diagnosis not present

## 2020-05-22 DIAGNOSIS — S63502A Unspecified sprain of left wrist, initial encounter: Secondary | ICD-10-CM | POA: Diagnosis not present

## 2020-05-22 MED ORDER — TRAMADOL HCL 50 MG PO TABS: 50.0000 mg | ORAL_TABLET | Freq: Four times a day (QID) | ORAL | 0 refills | Status: DC | PRN

## 2020-05-22 NOTE — ED Provider Notes (Signed)
[name]    CSN: [id number] Arrival date & time: 05/22/20  1110      History   Chief Complaint Chief Complaint  Patient presents with  . Fall  . Arm Pain    left    HPI Katelyn Fisher is a 69 y.o. female.   HPI Katelyn Fisher is a 69 y.o. female presenting to UC with c/o Left shoulder, elbow and wrist pain that started yesterday after trip and falling at [employer] in [place], [place]. Pt states EMS was called. She denies hitting her head or LOC. She declined transport to the hospital but did go to an UC. X-ray was not available at the [facility] but she was sent to an imaging center in the area. Once there, she was advised x-ray was not available on the weekends.  She had tramadol prescribed but it was sent to [facility], which is closed on the weekend. She has been wearing an ace wrap but no relief. Pain is aching and sore, worse with movement. She is Right hand dominant.   BP elevated in triage. Hx of HTN. States she believes the pain is causing her pressure to be high. Denies HA, dizziness or chest pain.    Past Medical History:  Diagnosis Date  . Anxiety   . Arthritis    back  . Asthma   . Depression   . Endometrial cancer ([place]) 2015  . Family history of breast cancer   . Family history of colon cancer   . Family history of ovarian cancer   . GERD (gastroesophageal reflux disease)   . History of colonic diverticulitis   . Hx of colonic polyps   . Hyperlipidemia   . Hypertension   . 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  . Osteopenia 09/2017   T score -1.2 FRAX 3.2% / 0.3%  . Personal history of chemotherapy 2015  . Radiation 12/17/14-01/03/15   vaginal brachytherapy  . Thyroid disease 06/01/2013    Patient Active Problem List   Diagnosis Date Noted  . Mass of left elbow 09/14/2019  . Flank pain 09/14/2019  . Subacromial impingement of left shoulder 12/30/2018  . Moderate persistent asthma without complication 24/26/8341  . Current use of  beta blocker 06/30/2018  . Allergic rhinitis 06/30/2018  . Acute bronchitis due to other specified organisms 06/30/2018  . History of endometrial cancer 02/03/2018  . Screening Mammogram 11/05/2017  . 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  . Type 2 diabetes mellitus with diabetic neuropathy, without long-term current use of insulin ([place]) 10/25/2016  . Microcytosis 09/28/2016  . Mild intermittent asthma without complication 96/22/2979  . Port catheter in place 11/15/2015  . Chemotherapy induced neutropenia ([place]) 11/29/2014  . Family history of breast cancer   . Family history of ovarian cancer   . Family history of prostate cancer   . Family history of colon cancer   . Morbid obesity with body mass index of 40.0-44.9 in adult [facility]) 11/09/2014  . 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 H/O total hysterectomy with bilateral salpingo-oophorectomy (BSO) 06/24/2014  . Endometrial cancer ([place]) 05/20/2014  . 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  . Thyroid disease 06/01/2013  . Low back pain radiating to right leg 08/18/2012  . Anemia 11/07/2011  . Hyperlipidemia 06/15/2011  . Diabetes mellitus type 2 in obese ([place]) 03/12/2011  . Acute pain of right knee 01/27/2011  . Obstructive sleep apnea 11/15/2010  . Osteoarthritis 11/15/2010  . Gastroesophageal reflux disease without esophagitis 09/14/2010  . DIVERTICULITIS,  HX OF 09/14/2010  . POSTMENOPAUSAL STATUS 10/01/2008  . COLONIC POLYPS, HX OF 03/10/2008  . Essential hypertension 08/13/2006    Past Surgical History:  Procedure Laterality Date  . ABDOMINAL HYSTERECTOMY  06/23/14   [facility], TRH/BSO  . CHOLECYSTECTOMY  1991  . DILATION AND CURETTAGE OF UTERUS    . IR REMOVAL TUN ACCESS W/ PORT W/O FL MOD SED  04/30/2017  . TUBAL LIGATION      OB History    Gravida  2   Para  2   Term      Preterm      AB      Living  3     SAB      TAB      Ectopic      Multiple      Live Births             Obstetric Comments  1 adopted child         Home Medications    Prior to Admission medications   Medication Sig Start Date End Date Taking? Authorizing Provider  albuterol (PROAIR HFA) 108 (90 Base) MCG/ACT inhaler 2 puffs every 4 hours as needed for coughing or wheezing spells 05/12/19  Yes Ambs, Kathrine Cords, FNP  albuterol (PROVENTIL) (2.5 MG/3ML) 0.083% nebulizer solution Take 3 mLs (2.5 mg total) by nebulization every 4 (four) hours as needed for wheezing or shortness of breath. 08/10/19  Yes Ambs, Kathrine Cords, FNP  amLODipine (NORVASC) 10 MG tablet TAKE 1 TABLET BY MOUTH  DAILY 02/15/20  Yes Roma Schanz R, DO  aspirin 81 MG tablet Take 81 mg by mouth daily.     Yes [provider]  atorvastatin (LIPITOR) 40 MG tablet TAKE 1 TABLET BY MOUTH  DAILY 02/15/20  Yes Roma Schanz R, DO  B Complex CAPS Take by mouth.   Yes [provider]  Baclofen 5 MG TABS Take 5 mg by mouth 2 (two) times daily as needed. 04/27/19  Yes Rosemarie Ax, MD  benazepril (LOTENSIN) 20 MG tablet Take 1 tablet (20 mg total) by mouth at bedtime. 10/12/19  Yes Shelda Pal, DO  betamethasone valerate ointment (VALISONE) 0.1 % Apply 1 application topically as needed.  10/06/19  Yes [provider]  budesonide-formoterol (SYMBICORT) 160-4.5 MCG/ACT inhaler Inhale 2 puffs into the lungs as needed.    Yes [provider]  carvedilol (COREG) 12.5 MG tablet TAKE 1 TABLET BY MOUTH  TWICE DAILY WITH A MEAL 02/15/20  Yes Roma Schanz R, DO  Diclofenac Sodium 3 % GEL Apply topically as needed.    Yes [provider]  EPINEPHrine (EPIPEN 2-PAK) 0.3 mg/0.3 mL IJ SOAJ injection Use as directed for severe allergic reaction 11/11/19  Yes Bardelas, Jose A, MD  famotidine (PEPCID) 20 MG tablet Take 1 tablet (20 mg total) by mouth 2 (two) times daily. Patient taking differently: Take 20 mg by mouth as needed.  11/11/19  Yes Bardelas, Jens Som, MD  furosemide  (LASIX) 20 MG tablet TAKE 1 TABLET BY MOUTH  DAILY 02/15/20  Yes Roma Schanz R, DO  levocetirizine (XYZAL) 5 MG tablet Take 1 tablet (5 mg total) by mouth every evening. Patient taking differently: Take 5 mg by mouth as needed.  09/29/19  Yes Shelda Pal, DO  Multiple Vitamin (MULTIVITAMIN) tablet Take 1 tablet by mouth daily.   Yes [provider]  traMADol (ULTRAM) 50 MG tablet Take 1 tablet (50 mg  total) by mouth every 6 (six) hours as needed. 05/22/20   Noe Gens, PA-C    Family History Family History  Problem Relation Age of Onset  . Heart disease Maternal Aunt        x 3 -2 brothers  . Hypertension Maternal Aunt   . Breast cancer Maternal Aunt        maternal half; dx in her 49s  . Cancer Maternal Aunt        cervical  . Ovarian cancer Maternal Aunt        dx in her 58s  . Prostate cancer Father 69  . Diabetes Father   . Cancer Father        lung cancer, asbestos exposure  . Heart disease Brother   . Diabetes Brother   . Diabetes Mother   . Lupus Mother   . Heart disease Brother   . Hyperlipidemia Sister   . Heart disease Brother   . Diabetes Brother   . Colon polyps Brother   . Heart disease Brother   . Diabetes Brother   . Colon polyps Brother   . Colon cancer Maternal Grandmother        dx in her 86s  . Leukemia Maternal Uncle 21  . Cancer Paternal Aunt        NOS- breast   . Prostate cancer Paternal Uncle        dx in his 11s  . Breast cancer Other        dx in her 58s    Social History Social History   Tobacco Use  . Smoking status: Never Smoker  . Smokeless tobacco: Never Used  Vaping Use  . Vaping Use: Never used  Substance Use Topics  . Alcohol use: No    Alcohol/week: 0.0 standard drinks  . Drug use: No     Allergies   Shellfish allergy, Oxycodone, Penicillins, Iodinated diagnostic agents, and Sulfa antibiotics   Review of Systems Review of Systems  Musculoskeletal: Positive for arthralgias and myalgias.    Skin: Negative for color change and wound.     Physical Exam Triage Vital Signs ED Triage Vitals [05/22/20 1141]  Enc Vitals Group     BP (!) 172/114     Pulse Rate 88     Resp      Temp      Temp src      SpO2 98 %     Weight      Height      Head Circumference      Peak Flow      Pain Score 10     Pain Loc      Pain Edu?      Excl. in [place]?    No data found.  Updated Vital Signs BP (!) 154/89 (BP Location: Right Arm)   Pulse 88   SpO2 98%   Visual Acuity Right Eye Distance:   Left Eye Distance:   Bilateral Distance:    Right Eye Near:   Left Eye Near:    Bilateral Near:     Physical Exam Vitals and nursing note reviewed.  Constitutional:      Appearance: Normal appearance. She is well-developed.  HENT:     Head: Normocephalic and atraumatic.  Neck:     Comments: No midline bone tenderness, no crepitus or step-offs.  Cardiovascular:     Rate and Rhythm: Normal rate and regular rhythm.     Pulses:  Radial pulses are 2+ on the left side.  Pulmonary:     Effort: Pulmonary effort is normal.  Musculoskeletal:        General: Tenderness present. Normal range of motion.     Cervical back: Normal range of motion and neck supple.     Comments: No spinal tenderness. Left shoulder: tenderness to proximal deltoid, full adduction. Increased pain with abduction, slight decreased ROM.  Left elbow: diffuse tenderness, full ROM without crepitus Left wrist and hand: mild edema, tenderness to radial aspect of wrist. Full ROM.   Skin:    General: Skin is warm and dry.     Capillary Refill: Capillary refill takes less than 2 seconds.  Neurological:     Mental Status: She is alert and oriented to person, place, and time.  Psychiatric:        Behavior: Behavior normal.      UC Treatments / Results  Labs (all labs ordered are listed, but only abnormal results are displayed) Labs Reviewed - No data to display  EKG   Radiology DG Elbow Complete  Left  Result Date: 05/22/2020 CLINICAL DATA:  Pain after fall EXAM: LEFT ELBOW - COMPLETE 3+ VIEW COMPARISON:  None. FINDINGS: There is no evidence of fracture, dislocation, or joint effusion. There is no evidence of arthropathy or other focal bone abnormality. Soft tissues are unremarkable. IMPRESSION: Negative. Electronically Signed   By: Dorise Bullion III M.D   On: 05/22/2020 12:36   DG Wrist Complete Left  Result Date: 05/22/2020 CLINICAL DATA:  Pain after fall EXAM: LEFT WRIST - COMPLETE 3+ VIEW COMPARISON:  None. FINDINGS: There is no evidence of fracture or dislocation. There is no evidence of arthropathy or other focal bone abnormality. Soft tissues are unremarkable. IMPRESSION: Negative. Electronically Signed   By: Dorise Bullion III M.D   On: 05/22/2020 12:40   DG Shoulder Left  Result Date: 05/22/2020 CLINICAL DATA:  Pain after fall EXAM: LEFT SHOULDER - 2+ VIEW COMPARISON:  None. FINDINGS: There is no evidence of fracture or dislocation. There is no evidence of arthropathy or other focal bone abnormality. Soft tissues are unremarkable. IMPRESSION: Negative. Electronically Signed   By: Dorise Bullion III M.D   On: 05/22/2020 12:34    Procedures Procedures (including critical care time)  Medications Ordered in UC Medications - No data to display  Initial Impression / Assessment and Plan / UC Course  I have reviewed the triage vital signs and the nursing notes.  Pertinent labs & imaging results that were available during my care of the patient were reviewed by me and considered in my medical decision making (see chart for details).    Reviewed imaging with pt Wrist splint and sling applied for comfort Encouraged to schedule f/u with her orthopedist later this week for recheck of symptoms AVS given  Final Clinical Impressions(s) / UC Diagnoses   Final diagnoses:  Left shoulder pain  Fall from slip, trip, or stumble, initial encounter  Left arm pain  Left wrist  sprain, initial encounter  Uncontrolled hypertension     Discharge Instructions      Tramadol is strong pain medication. While taking, do not drink alcohol, drive, or perform any other activities that requires focus while taking these medications.   Call to schedule a follow up appointment with your orthopedist later this week for further evaluation and treatment of your pain.     ED Prescriptions    Medication Sig Dispense Auth. Provider   traMADol Veatrice Bourbon) 50  MG tablet Take 1 tablet (50 mg total) by mouth every 6 (six) hours as needed. 10 tablet Noe Gens, PA-C     I have reviewed the PDMP during this encounter.   Noe Gens, PA-C 05/22/20 1352

## 2020-05-22 NOTE — Discharge Instructions (Signed)
  Tramadol is strong pain medication. While taking, do not drink alcohol, drive, or perform any other activities that requires focus while taking these medications.   Call to schedule a follow up appointment with your orthopedist later this week for further evaluation and treatment of your pain.

## 2020-05-22 NOTE — ED Triage Notes (Signed)
Patient fell in the [employer] on 05/21/2020 over a box spring that was in the aisle.  Patient left wrist, arm, shoulder, seen by EMT/Urgent Care, x-ray was unavailable.  Patient has taken Tylenol for the pain.  No loss of consciousness.

## 2020-05-31 ENCOUNTER — Other Ambulatory Visit (HOSPITAL_BASED_OUTPATIENT_CLINIC_OR_DEPARTMENT_OTHER): Payer: Self-pay | Admitting: Orthopedic Surgery

## 2020-05-31 DIAGNOSIS — M25522 Pain in left elbow: Secondary | ICD-10-CM | POA: Diagnosis not present

## 2020-05-31 DIAGNOSIS — M5136 Other intervertebral disc degeneration, lumbar region: Secondary | ICD-10-CM | POA: Diagnosis not present

## 2020-05-31 DIAGNOSIS — S63502A Unspecified sprain of left wrist, initial encounter: Secondary | ICD-10-CM | POA: Diagnosis not present

## 2020-05-31 DIAGNOSIS — M25512 Pain in left shoulder: Secondary | ICD-10-CM | POA: Diagnosis not present

## 2020-05-31 MED FILL — predniSONE 10 MG TABS: 10 | 12 days supply | Qty: 21 | Fill #0

## 2020-06-05 DIAGNOSIS — M25551 Pain in right hip: Secondary | ICD-10-CM | POA: Diagnosis not present

## 2020-06-14 ENCOUNTER — Ambulatory Visit (INDEPENDENT_AMBULATORY_CARE_PROVIDER_SITE_OTHER): Payer: Medicare Other | Admitting: Family Medicine

## 2020-06-14 ENCOUNTER — Other Ambulatory Visit: Payer: Self-pay

## 2020-06-14 ENCOUNTER — Encounter: Payer: Self-pay | Admitting: Family Medicine

## 2020-06-14 VITALS — BP 118/82 | HR 99 | Temp 98.6°F | Ht 62.0 in | Wt 242.4 lb

## 2020-06-14 DIAGNOSIS — R519 Headache, unspecified: Secondary | ICD-10-CM

## 2020-06-14 DIAGNOSIS — G4700 Insomnia, unspecified: Secondary | ICD-10-CM | POA: Diagnosis not present

## 2020-06-14 NOTE — Patient Instructions (Addendum)
Heat (pad or rice pillow in microwave) over affected area, 10-15 minutes twice daily.   Ice/cold pack over area for 10-15 min twice daily.  OK to take Tylenol 1000 mg (2 extra strength tabs) or 975 mg (3 regular strength tabs) every 6 hours as needed.   Sleep Hygiene Tips:  Do not watch TV or look at screens within 1 hour of going to bed. If you do, make sure there is a blue light filter (nighttime mode) involved.  Try to go to bed around the same time every night. Wake up at the same time within 1 hour of regular time. Ex: If you wake up at 7 AM for work, do not sleep past 8 AM on days that you don't work.  Do not drink alcohol before bedtime.  Do not consume caffeine-containing beverages after noon or within 9 hours of intended bedtime.  Get regular exercise/physical activity in your life, but not within 2 hours of planned bedtime.  Do not take naps.   Do not eat within 2 hours of planned bedtime.  Melatonin, 3-5 mg 30-60 minutes before planned bedtime may be helpful.   The bed should be for sleep or sex only. If after 20-30 minutes you are unable to fall asleep, get up and do something relaxing. Do this until you feel ready to go to sleep again.   Let us know if you need anything.  EXERCISES RANGE OF MOTION (ROM) AND STRETCHING EXERCISES  These exercises may help you when beginning to rehabilitate your issue. In order to successfully resolve your symptoms, you must improve your posture. These exercises are designed to help reduce the forward-head and rounded-shoulder posture which contributes to this condition. Your symptoms may resolve with or without further involvement from your physician, physical therapist or athletic trainer. While completing these exercises, remember:   Restoring tissue flexibility helps normal motion to return to the joints. This allows healthier, less painful movement and activity.  An effective stretch should be held for at least 20 seconds, although  you may need to begin with shorter hold times for comfort.  A stretch should never be painful. You should only feel a gentle lengthening or release in the stretched tissue.  Do not do any stretch or exercise that you cannot tolerate.  STRETCH- Axial Extensors  Lie on your back on the floor. You may bend your knees for comfort. Place a rolled-up hand towel or dish towel, about 2 inches in diameter, under the part of your head that makes contact with the floor.  Gently tuck your chin, as if trying to make a "double chin," until you feel a gentle stretch at the base of your head.  Hold 15-20 seconds. Repeat 2-3 times. Complete this exercise 1 time per day.   STRETCH - Axial Extension   Stand or sit on a firm surface. Assume a good posture: chest up, shoulders drawn back, abdominal muscles slightly tense, knees unlocked (if standing) and feet hip width apart.  Slowly retract your chin so your head slides back and your chin slightly lowers. Continue to look straight ahead.  You should feel a gentle stretch in the back of your head. Be certain not to feel an aggressive stretch since this can cause headaches later.  Hold for 15-20 seconds. Repeat 2-3 times. Complete this exercise 1 time per day.  STRETCH - Cervical Side Bend   Stand or sit on a firm surface. Assume a good posture: chest up, shoulders drawn back, abdominal muscles  slightly tense, knees unlocked (if standing) and feet hip width apart.  Without letting your nose or shoulders move, slowly tip your right / left ear to your shoulder until your feel a gentle stretch in the muscles on the opposite side of your neck.  Hold 15-20 seconds. Repeat 2-3 times. Complete this exercise 1-2 times per day.  STRETCH - Cervical Rotators   Stand or sit on a firm surface. Assume a good posture: chest up, shoulders drawn back, abdominal muscles slightly tense, knees unlocked (if standing) and feet hip width apart.  Keeping your eyes level  with the ground, slowly turn your head until you feel a gentle stretch along the back and opposite side of your neck.  Hold 15-20 seconds. Repeat 2-3 times. Complete this exercise 1-2 times per day.  RANGE OF MOTION - Neck Circles   Stand or sit on a firm surface. Assume a good posture: chest up, shoulders drawn back, abdominal muscles slightly tense, knees unlocked (if standing) and feet hip width apart.  Gently roll your head down and around from the back of one shoulder to the back of the other. The motion should never be forced or painful.  Repeat the motion 10-20 times, or until you feel the neck muscles relax and loosen. Repeat 2-3 times. Complete the exercise 1-2 times per day. STRENGTHENING EXERCISES - Cervical Strain and Sprain These exercises may help you when beginning to rehabilitate your injury. They may resolve your symptoms with or without further involvement from your physician, physical therapist, or athletic trainer. While completing these exercises, remember:   Muscles can gain both the endurance and the strength needed for everyday activities through controlled exercises.  Complete these exercises as instructed by your physician, physical therapist, or athletic trainer. Progress the resistance and repetitions only as guided.  You may experience muscle soreness or fatigue, but the pain or discomfort you are trying to eliminate should never worsen during these exercises. If this pain does worsen, stop and make certain you are following the directions exactly. If the pain is still present after adjustments, discontinue the exercise until you can discuss the trouble with your clinician.  STRENGTH - Cervical Flexors, Isometric  Face a wall, standing about 6 inches away. Place a small pillow, a ball about 6-8 inches in diameter, or a folded towel between your forehead and the wall.  Slightly tuck your chin and gently push your forehead into the soft object. Push only with mild  to moderate intensity, building up tension gradually. Keep your jaw and forehead relaxed.  Hold 10 to 20 seconds. Keep your breathing relaxed.  Release the tension slowly. Relax your neck muscles completely before you start the next repetition. Repeat 2-3 times. Complete this exercise 1 time per day.  STRENGTH- Cervical Lateral Flexors, Isometric   Stand about 6 inches away from a wall. Place a small pillow, a ball about 6-8 inches in diameter, or a folded towel between the side of your head and the wall.  Slightly tuck your chin and gently tilt your head into the soft object. Push only with mild to moderate intensity, building up tension gradually. Keep your jaw and forehead relaxed.  Hold 10 to 20 seconds. Keep your breathing relaxed.  Release the tension slowly. Relax your neck muscles completely before you start the next repetition. Repeat 2-3 times. Complete this exercise 1 time per day.  STRENGTH - Cervical Extensors, Isometric   Stand about 6 inches away from a wall. Place a small pillow,  a ball about 6-8 inches in diameter, or a folded towel between the back of your head and the wall.  Slightly tuck your chin and gently tilt your head back into the soft object. Push only with mild to moderate intensity, building up tension gradually. Keep your jaw and forehead relaxed.  Hold 10 to 20 seconds. Keep your breathing relaxed.  Release the tension slowly. Relax your neck muscles completely before you start the next repetition. Repeat 2-3 times. Complete this exercise 1 time per day.  POSTURE AND BODY MECHANICS CONSIDERATIONS Keeping correct posture when sitting, standing or completing your activities will reduce the stress put on different body tissues, allowing injured tissues a chance to heal and limiting painful experiences. The following are general guidelines for improved posture. Your physician or physical therapist will provide you with any instructions specific to your needs.  While reading these guidelines, remember:  The exercises prescribed by your provider will help you have the flexibility and strength to maintain correct postures.  The correct posture provides the optimal environment for your joints to work. All of your joints have less wear and tear when properly supported by a spine with good posture. This means you will experience a healthier, less painful body.  Correct posture must be practiced with all of your activities, especially prolonged sitting and standing. Correct posture is as important when doing repetitive low-stress activities (typing) as it is when doing a single heavy-load activity (lifting).  PROLONGED STANDING WHILE SLIGHTLY LEANING FORWARD When completing a task that requires you to lean forward while standing in one place for a long time, place either foot up on a stationary 2- to 4-inch high object to help maintain the best posture. When both feet are on the ground, the low back tends to lose its slight inward curve. If this curve flattens (or becomes too large), then the back and your other joints will experience too much stress, fatigue more quickly, and can cause pain.   RESTING POSITIONS Consider which positions are most painful for you when choosing a resting position. If you have pain with flexion-based activities (sitting, bending, stooping, squatting), choose a position that allows you to rest in a less flexed posture. You would want to avoid curling into a fetal position on your side. If your pain worsens with extension-based activities (prolonged standing, working overhead), avoid resting in an extended position such as sleeping on your stomach. Most people will find more comfort when they rest with their spine in a more neutral position, neither too rounded nor too arched. Lying on a non-sagging bed on your side with a pillow between your knees, or on your back with a pillow under your knees will often provide some relief. Keep in  mind, being in any one position for a prolonged period of time, no matter how correct your posture, can still lead to stiffness.  WALKING Walk with an upright posture. Your ears, shoulders, and hips should all line up. OFFICE WORK When working at a desk, create an environment that supports good, upright posture. Without extra support, muscles fatigue and lead to excessive strain on joints and other tissues.  CHAIR:  A chair should be able to slide under your desk when your back makes contact with the back of the chair. This allows you to work closely.  The chair's height should allow your eyes to be level with the upper part of your monitor and your hands to be slightly lower than your elbows.  Body position: ?  Your feet should make contact with the floor. If this is not possible, use a foot rest. ? Keep your ears over your shoulders. This will reduce stress on your neck and low back.

## 2020-06-14 NOTE — Progress Notes (Signed)
Chief Complaint  Patient presents with  . Headache  . Insomnia    Katelyn Fisher is a 69 y.o. female here for evaluation of bilateral headache.  Duration: 3 weeks  B/l frontal headache She fell in a store on 10/30 and started having issues after that.  Palliation: just wears off after 15-20 min Provocation: none Severity: 9 Quality: achy. Associated symptoms: memory issues Denies: nausea, vomiting, sonophobia, photophobia, diplopia, vertigo, neck stiffness Currently with headache? No Failed therapies: Has not tried anything  Pt has been having issues with insomnia where she will wake up every 2 hrs after falling asleep. No anxiety or pain. She is not thinking as clearly during the day because of this. No mood issues. Has not tried anything at home thus far.   Past Medical History:  Diagnosis Date  . Anxiety   . Arthritis    back  . Asthma   . Depression   . Endometrial cancer ([place]) 2015  . Family history of breast cancer   . Family history of colon cancer   . Family history of ovarian cancer   . GERD (gastroesophageal reflux disease)   . History of colonic diverticulitis   . Hx of colonic polyps   . Hyperlipidemia   . Hypertension   . 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  . Osteopenia 09/2017   T score -1.2 FRAX 3.2% / 0.3%  . Personal history of chemotherapy 2015  . Radiation 12/17/14-01/03/15   vaginal brachytherapy  . Thyroid disease 06/01/2013   Family History  Problem Relation Age of Onset  . Heart disease Maternal Aunt        x 3 -2 brothers  . Hypertension Maternal Aunt   . Breast cancer Maternal Aunt        maternal half; dx in her 45s  . Cancer Maternal Aunt        cervical  . Ovarian cancer Maternal Aunt        dx in her 22s  . Prostate cancer Father 37  . Diabetes Father   . Cancer Father        lung cancer, asbestos exposure  . Heart disease Brother   . Diabetes Brother   . Diabetes Mother   . Lupus Mother   . Heart disease Brother   .  Hyperlipidemia Sister   . Heart disease Brother   . Diabetes Brother   . Colon polyps Brother   . Heart disease Brother   . Diabetes Brother   . Colon polyps Brother   . Colon cancer Maternal Grandmother        dx in her 55s  . Leukemia Maternal Uncle 21  . Cancer Paternal Aunt        NOS- breast   . Prostate cancer Paternal Uncle        dx in his 8s  . Breast cancer Other        dx in her 72s   No outpatient medications have been marked as taking for the 06/14/20 encounter (Office Visit) with Shelda Pal, DO.   Current Facility-Administered Medications for the 06/14/20 encounter (Office Visit) with Shelda Pal, DO  Medication  . 0.9 %  sodium chloride infusion    BP 118/82 (BP Location: Left Arm, Patient Position: Sitting, Cuff Size: Large)   Pulse 99   Temp 98.6 F (37 C) (Oral)   Ht 5\' 2"  (1.575 m)   Wt 242 lb 6 oz (109.9 kg)   SpO2 95%   BMI 44.33  kg/m  General: awake, alert, appearing stated age Eyes: PERRLA, EOMi Heart: RRR, no bruits Lungs: CTAB, no accessory muscle use Neuro: CN 2-12 intact, no cerebellar signs, DTR's equal and symmetry, no clonus MSK: 5/5 strength throughout, normal gait, + TTP over posterior cervical triangle and paraspinal cervical musculature Psych: Age appropriate judgment and insight, mood and affect normal  Frontal headache  Insomnia, unspecified type  1. This is likely a tension headache from when she fell. There are no red flag s/s's in hx or exam. She is tender over neck musculature. I doubt intracranial pathosis at this time. We will tx conservatively with heat, ice, Tylenol, stretches/exercises. Follow up in 4 weeks. 2. Sleep hygiene discussed and items provided in paperwork. She will try melatonin at home and we will reconvene next month as well.  The patient voiced understanding and agreement to the plan.  [place], [place] 9:29 AM 06/14/20

## 2020-06-15 DIAGNOSIS — M5136 Other intervertebral disc degeneration, lumbar region: Secondary | ICD-10-CM | POA: Diagnosis not present

## 2020-06-15 DIAGNOSIS — M47816 Spondylosis without myelopathy or radiculopathy, lumbar region: Secondary | ICD-10-CM | POA: Diagnosis not present

## 2020-06-21 DIAGNOSIS — S63502A Unspecified sprain of left wrist, initial encounter: Secondary | ICD-10-CM | POA: Diagnosis not present

## 2020-06-21 DIAGNOSIS — M25522 Pain in left elbow: Secondary | ICD-10-CM | POA: Diagnosis not present

## 2020-06-21 DIAGNOSIS — M25532 Pain in left wrist: Secondary | ICD-10-CM | POA: Diagnosis not present

## 2020-06-21 DIAGNOSIS — M25512 Pain in left shoulder: Secondary | ICD-10-CM | POA: Diagnosis not present

## 2020-06-23 DIAGNOSIS — S63502A Unspecified sprain of left wrist, initial encounter: Secondary | ICD-10-CM | POA: Diagnosis not present

## 2020-06-23 DIAGNOSIS — M25522 Pain in left elbow: Secondary | ICD-10-CM | POA: Diagnosis not present

## 2020-06-23 DIAGNOSIS — M25532 Pain in left wrist: Secondary | ICD-10-CM | POA: Diagnosis not present

## 2020-06-23 DIAGNOSIS — M25512 Pain in left shoulder: Secondary | ICD-10-CM | POA: Diagnosis not present

## 2020-07-05 ENCOUNTER — Ambulatory Visit: Payer: Medicare Other | Admitting: Family Medicine

## 2020-07-05 DIAGNOSIS — M25551 Pain in right hip: Secondary | ICD-10-CM | POA: Diagnosis not present

## 2020-07-07 DIAGNOSIS — M47816 Spondylosis without myelopathy or radiculopathy, lumbar region: Secondary | ICD-10-CM | POA: Diagnosis not present

## 2020-07-08 DIAGNOSIS — M25532 Pain in left wrist: Secondary | ICD-10-CM | POA: Diagnosis not present

## 2020-07-08 DIAGNOSIS — S63502A Unspecified sprain of left wrist, initial encounter: Secondary | ICD-10-CM | POA: Diagnosis not present

## 2020-07-08 DIAGNOSIS — M25522 Pain in left elbow: Secondary | ICD-10-CM | POA: Diagnosis not present

## 2020-07-08 DIAGNOSIS — M25512 Pain in left shoulder: Secondary | ICD-10-CM | POA: Diagnosis not present

## 2020-07-20 DIAGNOSIS — M25512 Pain in left shoulder: Secondary | ICD-10-CM | POA: Diagnosis not present

## 2020-07-20 DIAGNOSIS — M25532 Pain in left wrist: Secondary | ICD-10-CM | POA: Diagnosis not present

## 2020-07-25 ENCOUNTER — Other Ambulatory Visit: Payer: Self-pay

## 2020-07-25 ENCOUNTER — Telehealth: Payer: Self-pay

## 2020-07-25 ENCOUNTER — Other Ambulatory Visit: Payer: Self-pay | Admitting: Family Medicine

## 2020-07-25 ENCOUNTER — Encounter: Payer: Self-pay | Admitting: Family Medicine

## 2020-07-25 ENCOUNTER — Telehealth (INDEPENDENT_AMBULATORY_CARE_PROVIDER_SITE_OTHER): Payer: Medicare Other | Admitting: Family Medicine

## 2020-07-25 VITALS — Temp 97.5°F

## 2020-07-25 DIAGNOSIS — J4541 Moderate persistent asthma with (acute) exacerbation: Secondary | ICD-10-CM

## 2020-07-25 MED ORDER — BENZONATATE 100 MG PO CAPS: 100.0000 mg | ORAL_CAPSULE | Freq: Three times a day (TID) | ORAL | 0 refills | Status: DC | PRN

## 2020-07-25 MED ORDER — FLUTICASONE PROPIONATE HFA 110 MCG/ACT IN AERO: 2.0000 | INHALATION_SPRAY | Freq: Two times a day (BID) | RESPIRATORY_TRACT | 0 refills | Status: DC

## 2020-07-25 MED FILL — BENZONATATE 100 MG CAPS: 100 | 10 days supply | Qty: 30 | Fill #0

## 2020-07-25 MED FILL — FLOVENT HFA 110 MCG INHALER: 110 | 30 days supply | Qty: 12 | Fill #0

## 2020-07-25 NOTE — Progress Notes (Signed)
Chief Complaint  Patient presents with  . Cough    Pt states sxs started Thursday last week. Pt states cough is productive, no fever, Pt states no other sxs.     Katelyn Fisher here for URI complaints. Due to COVID-19 pandemic, we are interacting via telephone as web platform failed. I verified patient's ID using 2 identifiers. Patient agreed to proceed with visit via this method. Patient is at home, I am at office. Patient and I are present for visit.   Duration: 5 days  Associated symptoms: productive cough, slight wheeze Denies: sinus headache, sinus congestion, sinus pain, rhinorrhea, itchy watery eyes, ear pain, ear drainage, sore throat, shortness of breath, myalgia and fevers Treatment to date: Compliant w Symbicort, albuterol Exposed to a car where fumes seemed to bother her.  Sick contacts: husband w cough also  Past Medical History:  Diagnosis Date  . Anxiety   . Arthritis    back  . Asthma   . Depression   . Endometrial cancer ([facility]) 2015  . Family history of breast cancer   . Family history of colon cancer   . Family history of ovarian cancer   . GERD (gastroesophageal reflux disease)   . History of colonic diverticulitis   . Hx of colonic polyps   . Hyperlipidemia   . Hypertension   . 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  . Osteopenia 09/2017   T score -1.2 FRAX 3.2% / 0.3%  . Personal history of chemotherapy 2015  . Radiation 12/17/14-01/03/15   vaginal brachytherapy  . Thyroid disease 06/01/2013    Temp (!) 97.5 F (36.4 C)  No conversational dyspnea Age appropriate judgment and insight Nml affect and mood  Moderate persistent asthma with acute exacerbation - Plan: benzonatate (TESSALON) 100 MG capsule, fluticasone (FLOVENT HFA) 110 MCG/ACT inhaler   Cont Symbicort, add Flovent bid for 1-2 weeks. [place] for symptomatic care earlier on.  Continue to push fluids, practice good hand hygiene, cover mouth when coughing. F/u prn. If starting to experience fevers,  shaking, or shortness of breath, seek immediate care. Total time 7 min Pt voiced understanding and agreement to the plan.  Jilda Roche [place], DO 07/25/20 9:40 AM

## 2020-07-25 NOTE — Telephone Encounter (Signed)
Nurse Assessment Nurse: Shelva Majestic, RN, Marchelle Folks Date/Time Katelyn Fisher Time): 07/25/2020 8:00:10 AM Confirm and document reason for call. If symptomatic, describe symptoms. ---Caller states she has a cough. Does the patient have any new or worsening symptoms? ---Yes Will a triage be completed? ---Yes Related visit to physician within the last 2 weeks? ---No Does the PT have any chronic conditions? (i.e. diabetes, asthma, this includes High risk factors for pregnancy, etc.) ---Yes List chronic conditions. ---Asthma Is this a behavioral health or substance abuse call? ---No Guidelines Guideline Title Affirmed Question Affirmed Notes Nurse Date/Time (Eastern Time) Asthma Attack [1] MILD asthma attack (e.g., no SOB at rest, mild SOB with walking, speaks normally in sentences, mild wheezing) AND [2] persists > 24 hours on appropriate treatment [place], RN, Marchelle Folks 07/25/2020 8:01:03 AM Disp. Time Katelyn Fisher Time) Disposition Final User PLEASE NOTE: All timestamps contained within this report are represented as [place] Standard Time. CONFIDENTIALTY NOTICE: This fax transmission is intended only for the addressee. It contains information that is legally privileged, confidential or otherwise protected from use or disclosure. If you are not the intended recipient, you are strictly prohibited from reviewing, disclosing, copying using or disseminating any of this information or taking any action in reliance on or regarding this information. If you have received this fax in error, please notify us immediately by telephone so that we can arrange for its return to [place]. Phone: [phone], Toll-Free: [phone], Fax: [phone] Page: 2 of 2 Call Id: [id number] 07/25/2020 8:03:46 AM See PCP within 24 Hours Yes Shelva Majestic, RN, Marchelle Folks Caller Disagree/Comply Comply Caller Understands Yes PreDisposition Call Doctor Care Advice Given Per Guideline SEE PCP WITHIN 24 HOURS: DRINKING FLUIDS AND USING A HUMIDIFIER: REMOVE  ALLERGENS: * Wheezing is not improved after neb or inhaler * You become worse CARE ADVICE given per Asthma Attack (Adult) guideline. Referrals REFERRED TO PCP OFFICE  Pt w/ appt Dr Carmelia Roller today.

## 2020-07-27 ENCOUNTER — Other Ambulatory Visit: Payer: Self-pay | Admitting: Family Medicine

## 2020-07-27 DIAGNOSIS — E1169 Type 2 diabetes mellitus with other specified complication: Secondary | ICD-10-CM

## 2020-08-01 DIAGNOSIS — G5602 Carpal tunnel syndrome, left upper limb: Secondary | ICD-10-CM | POA: Diagnosis not present

## 2020-08-02 DIAGNOSIS — M25512 Pain in left shoulder: Secondary | ICD-10-CM | POA: Diagnosis not present

## 2020-08-02 DIAGNOSIS — M25522 Pain in left elbow: Secondary | ICD-10-CM | POA: Diagnosis not present

## 2020-08-02 DIAGNOSIS — M25532 Pain in left wrist: Secondary | ICD-10-CM | POA: Diagnosis not present

## 2020-08-02 DIAGNOSIS — S63502A Unspecified sprain of left wrist, initial encounter: Secondary | ICD-10-CM | POA: Diagnosis not present

## 2020-08-03 DIAGNOSIS — M25532 Pain in left wrist: Secondary | ICD-10-CM | POA: Diagnosis not present

## 2020-08-03 DIAGNOSIS — S52325D Nondisplaced transverse fracture of shaft of left radius, subsequent encounter for closed fracture with routine healing: Secondary | ICD-10-CM | POA: Diagnosis not present

## 2020-08-04 DIAGNOSIS — M25512 Pain in left shoulder: Secondary | ICD-10-CM | POA: Diagnosis not present

## 2020-08-04 DIAGNOSIS — S63502A Unspecified sprain of left wrist, initial encounter: Secondary | ICD-10-CM | POA: Diagnosis not present

## 2020-08-04 DIAGNOSIS — M25522 Pain in left elbow: Secondary | ICD-10-CM | POA: Diagnosis not present

## 2020-08-04 DIAGNOSIS — M25532 Pain in left wrist: Secondary | ICD-10-CM | POA: Diagnosis not present

## 2020-08-05 DIAGNOSIS — M25551 Pain in right hip: Secondary | ICD-10-CM | POA: Diagnosis not present

## 2020-08-09 IMAGING — DX DG ELBOW COMPLETE 3+V*L*
4 series · 4 of 4 positions shown · non-contrast
Comparison: None.

CLINICAL DATA: Left elbow pain

EXAM:
LEFT ELBOW - COMPLETE 3+ VIEW

[elbow ap]
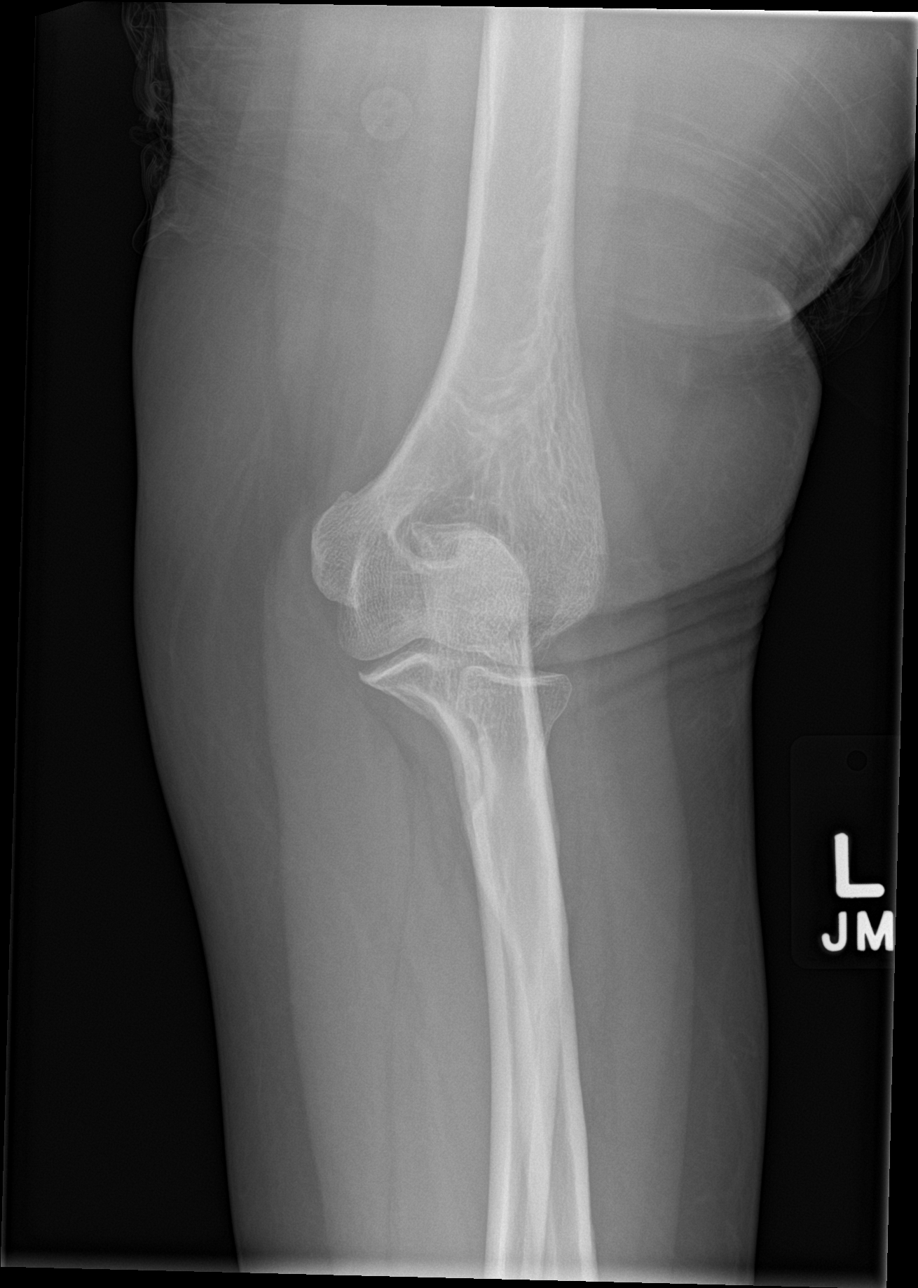

[elbow obl (1 of 2)]
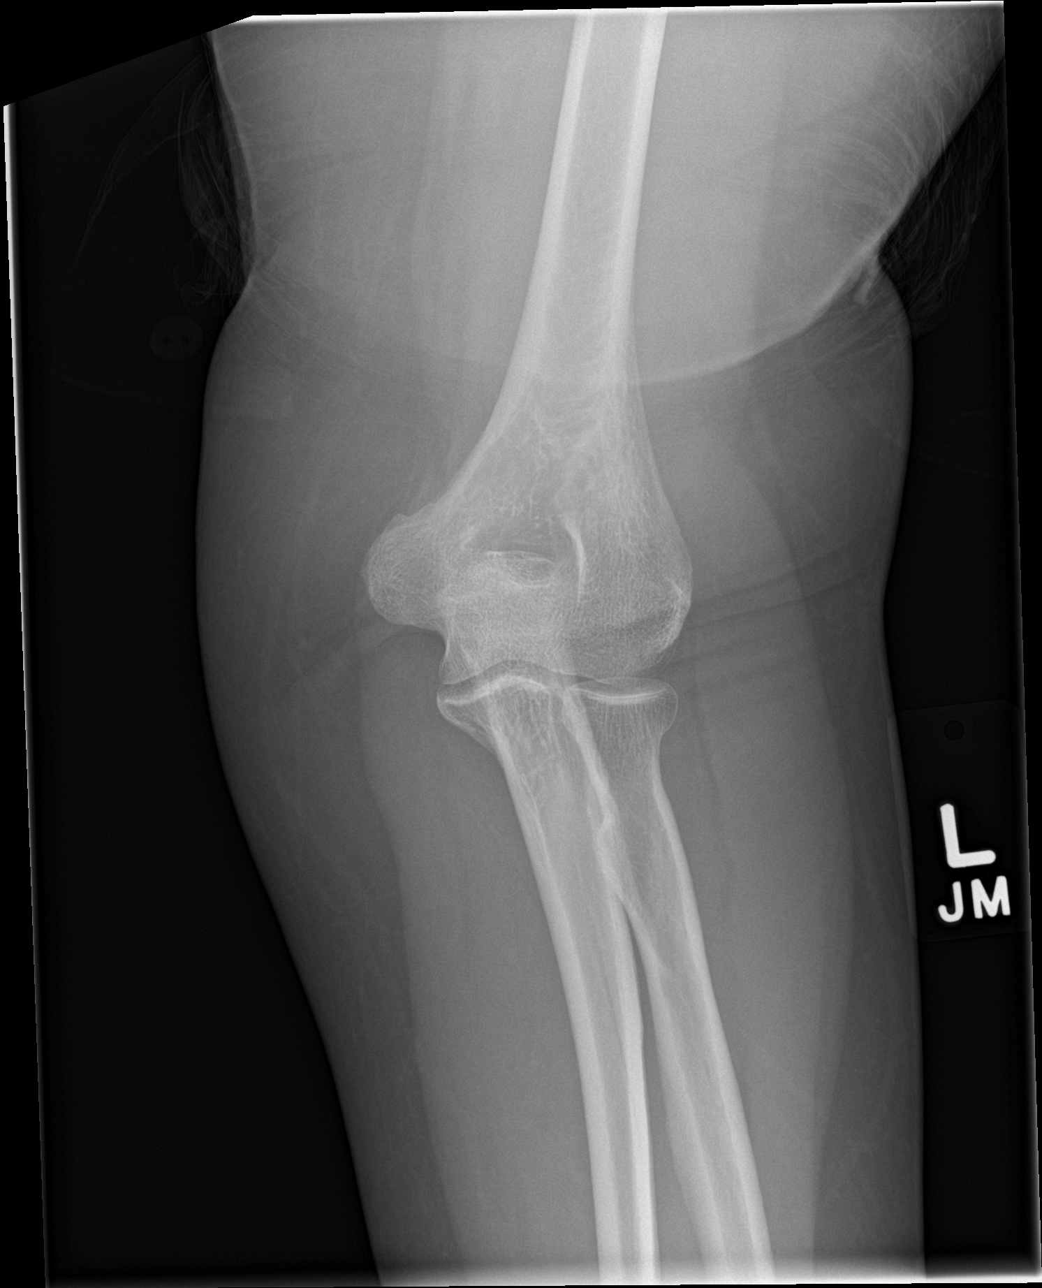

[elbow obl (2 of 2)]
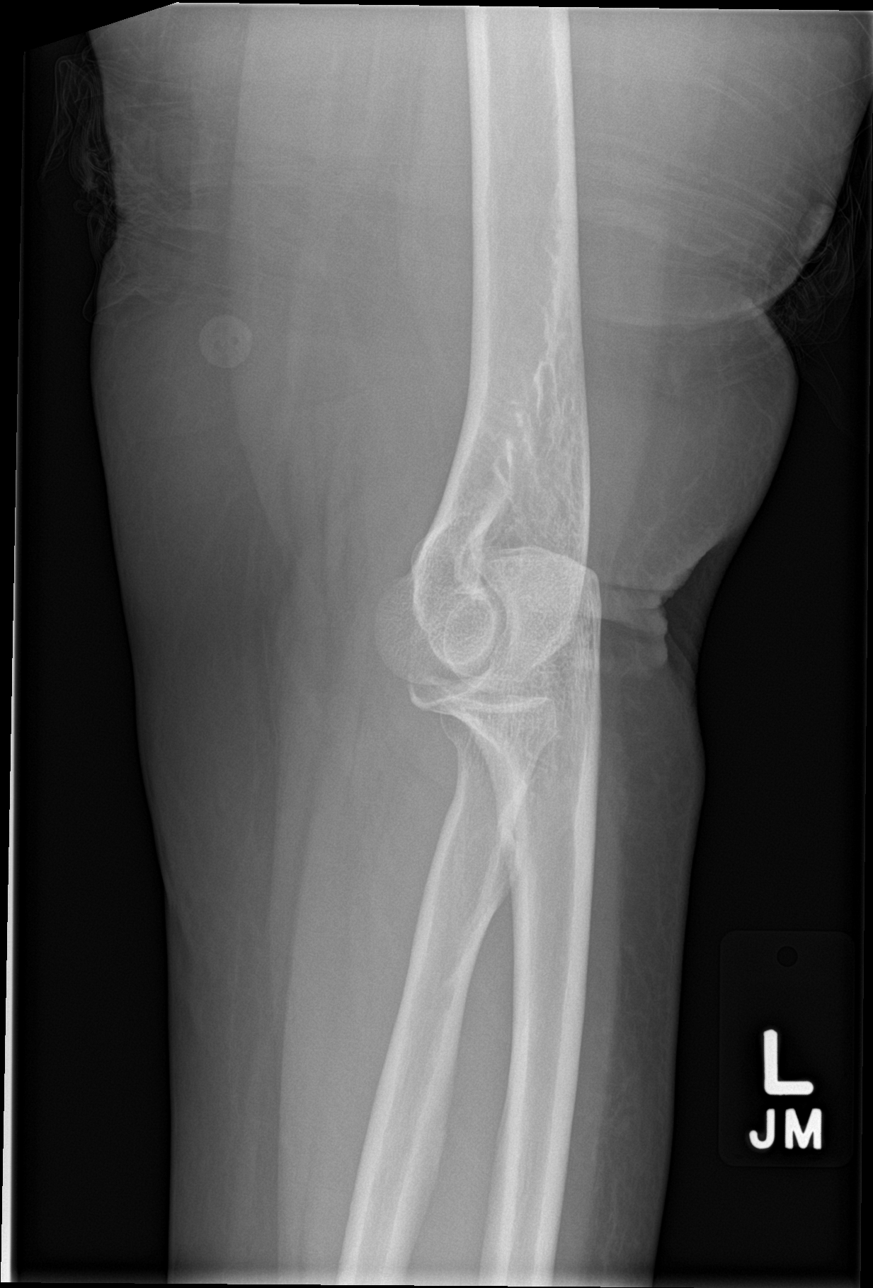

[elbow lat]
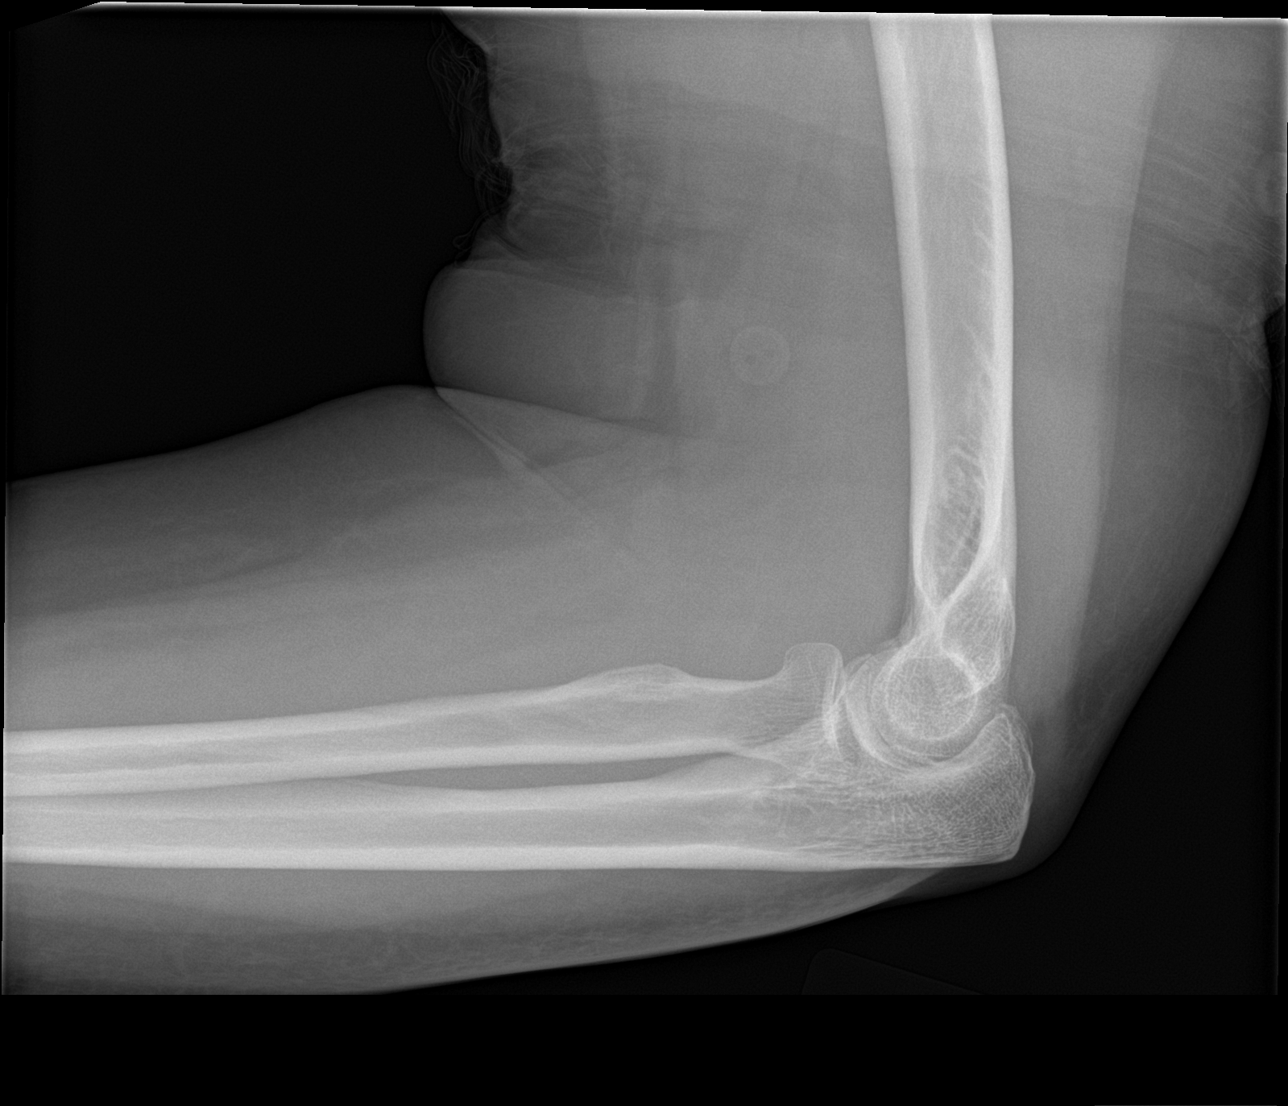

[4 of 4 positions shown; findings below may reference images not displayed]

FINDINGS: There is no evidence of fracture, dislocation, or joint effusion.
There is no evidence of arthropathy or other focal bone abnormality.
Soft tissues are unremarkable.
IMPRESSION: Negative.

## 2020-08-11 DIAGNOSIS — M25512 Pain in left shoulder: Secondary | ICD-10-CM | POA: Diagnosis not present

## 2020-08-11 DIAGNOSIS — M25532 Pain in left wrist: Secondary | ICD-10-CM | POA: Diagnosis not present

## 2020-08-11 DIAGNOSIS — M25522 Pain in left elbow: Secondary | ICD-10-CM | POA: Diagnosis not present

## 2020-08-11 DIAGNOSIS — S63502A Unspecified sprain of left wrist, initial encounter: Secondary | ICD-10-CM | POA: Diagnosis not present

## 2020-08-12 DIAGNOSIS — J45909 Unspecified asthma, uncomplicated: Secondary | ICD-10-CM | POA: Diagnosis not present

## 2020-08-12 DIAGNOSIS — Z87828 Personal history of other (healed) physical injury and trauma: Secondary | ICD-10-CM | POA: Diagnosis not present

## 2020-08-14 IMAGING — MR MR ELBOW*L* W/O CM
6 series · 40 of 40 positions shown · non-contrast
Comparison: Radiographs dated 10/05/2019

CLINICAL DATA: Posterior soft tissue mass of the elbow.

EXAM:
MRI OF THE LEFT ELBOW WITHOUT CONTRAST
TECHNIQUE: Multiplanar, multisequence MR imaging of the elbow was performed.
The patient declined intravenous contrast.

[Series 4: T1 · oblique · 3.0mm · 0.55mm/px · 7 of 28 slices shown (1 of 3)]
[im 1/28]
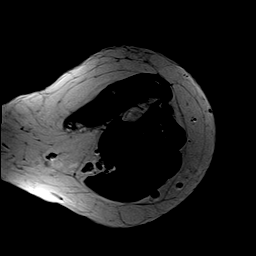
[im 5/28]
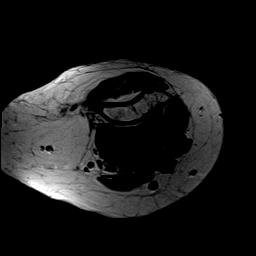
[im 10/28]
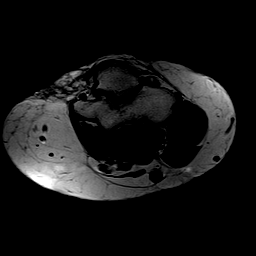
[im 14/28]
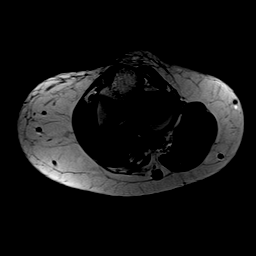
[im 19/28]
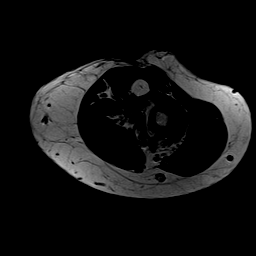
[im 23/28]
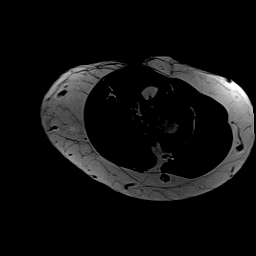
[im 28/28]
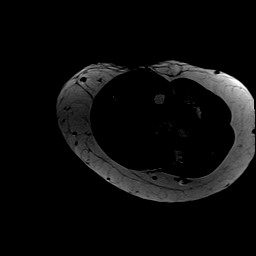

[Series 5: T2 fat-sat · oblique · 3.0mm · 0.55mm/px · 7 of 28 slices shown (1 of 2)]
[im 1/28]
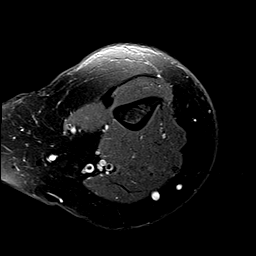
[im 5/28]
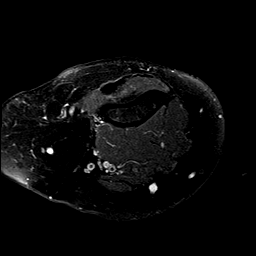
[im 10/28]
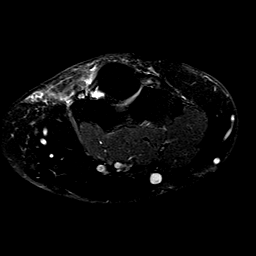
[im 14/28]
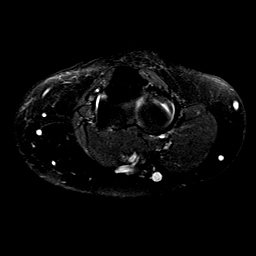
[im 19/28]
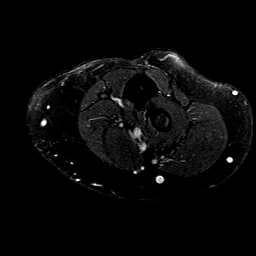
[im 23/28]
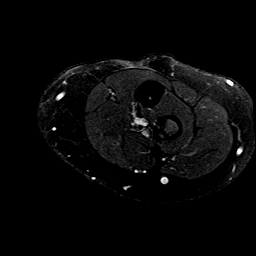
[im 28/28]
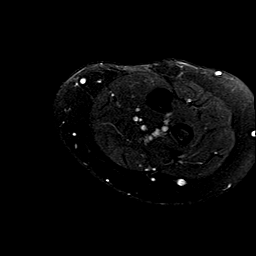

[Series 6: T1 · coronal · 3.0mm · 0.55mm/px · 6 of 24 slices shown (2 of 3)]
[im 1/24]
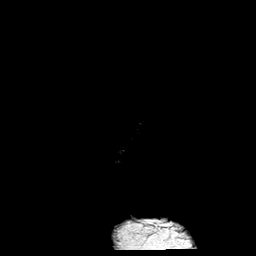
[im 5/24]
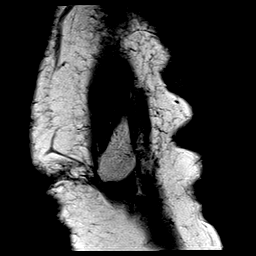
[im 10/24]
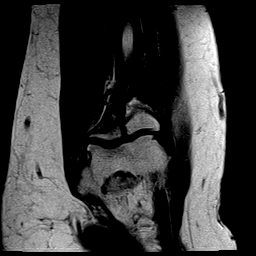
[im 14/24]
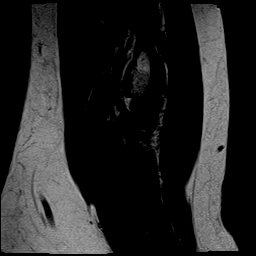
[im 19/24]
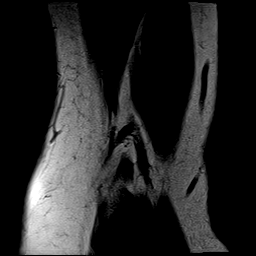
[im 24/24]
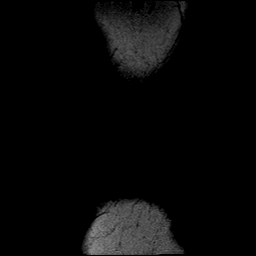

[Series 7: T2 fat-sat · coronal · 3.0mm · 0.55mm/px · 6 of 23 slices shown (2 of 2)]
[im 1/23]
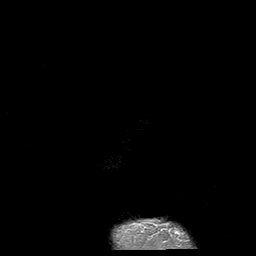
[im 5/23]
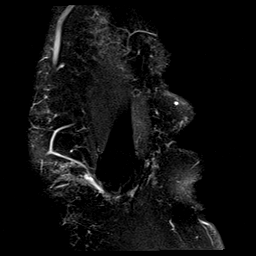
[im 9/23]
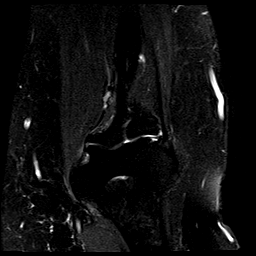
[im 14/23]
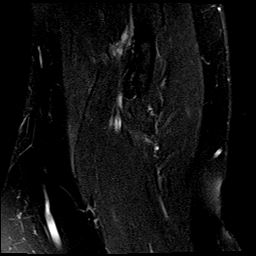
[im 18/23]
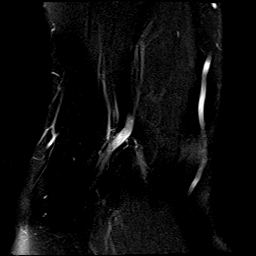
[im 23/23]
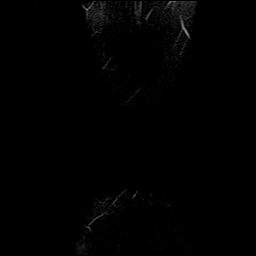

[Series 8: PD fat-sat · sagittal · 3.0mm · 0.55mm/px · 7 of 30 slices shown]
[im 1/30]
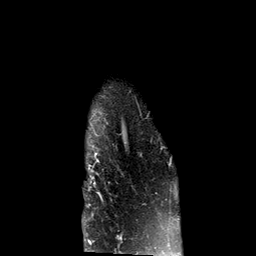
[im 5/30]
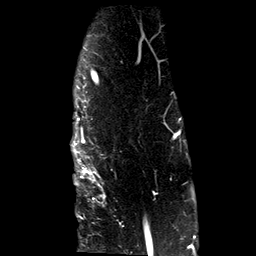
[im 10/30]
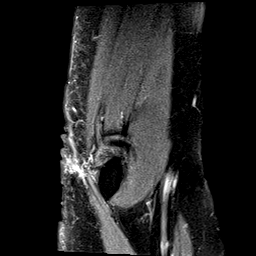
[im 15/30]
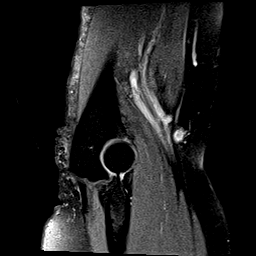
[im 20/30]
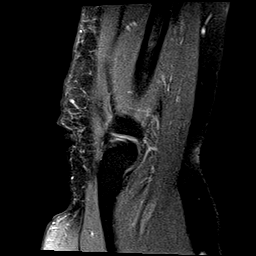
[im 25/30]
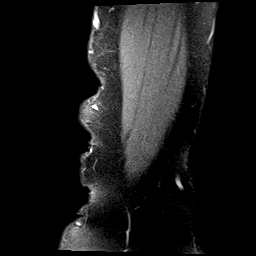
[im 30/30]
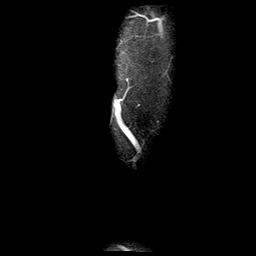

[Series 9: T1 · sagittal · 3.0mm · 0.55mm/px · 7 of 30 slices shown (3 of 3)]
[im 1/30]
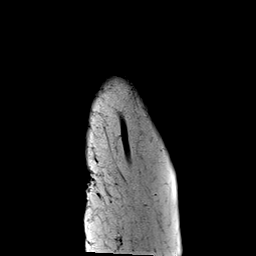
[im 5/30]
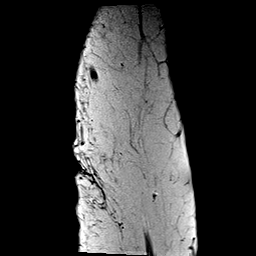
[im 10/30]
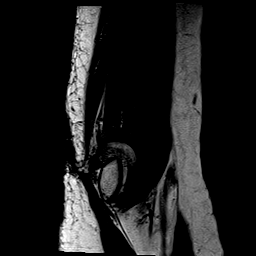
[im 15/30]
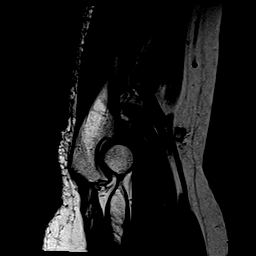
[im 20/30]
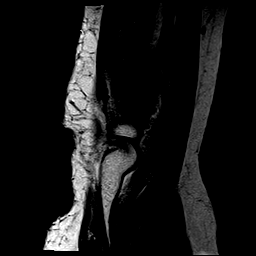
[im 25/30]
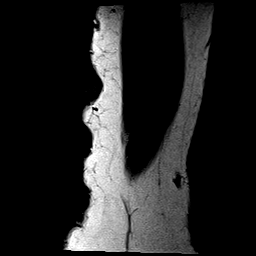
[im 30/30]
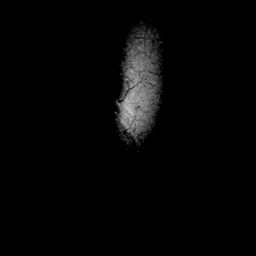

[40 of 40 positions shown; findings below may reference images not displayed]

FINDINGS: There is prominent subcutaneous fat at the posterolateral aspect of
the right elbow in the region of the patient's concern. There is no
soft tissue mass or cyst or other significant abnormality.

TENDONS

Common forearm flexor origin: Normal.

Common forearm extensor origin: Normal.

Biceps: Normal.

Triceps: Normal.

LIGAMENTS

Medial stabilizers: Normal.

Lateral stabilizers:  Normal.

Cartilage: Normal.

Joint: Normal.

Cubital tunnel: Slight focal increased signal intensity ulnar nerve
at the level of the medial epicondyle. Slight nonspecific edema in
the adjacent subcutaneous fat. These findings are not felt to be
significant.

Bones: Normal.
IMPRESSION: Essentially normal MRI of the right elbow. The area of soft tissue
prominence of concern represents prominent but otherwise normal
subcutaneous fat.

## 2020-08-16 DIAGNOSIS — M25532 Pain in left wrist: Secondary | ICD-10-CM | POA: Diagnosis not present

## 2020-08-16 DIAGNOSIS — M25512 Pain in left shoulder: Secondary | ICD-10-CM | POA: Diagnosis not present

## 2020-08-16 DIAGNOSIS — S63502A Unspecified sprain of left wrist, initial encounter: Secondary | ICD-10-CM | POA: Diagnosis not present

## 2020-08-16 DIAGNOSIS — M25522 Pain in left elbow: Secondary | ICD-10-CM | POA: Diagnosis not present

## 2020-08-16 DIAGNOSIS — M47816 Spondylosis without myelopathy or radiculopathy, lumbar region: Secondary | ICD-10-CM | POA: Diagnosis not present

## 2020-08-17 DIAGNOSIS — S52515A Nondisplaced fracture of left radial styloid process, initial encounter for closed fracture: Secondary | ICD-10-CM | POA: Diagnosis not present

## 2020-08-17 DIAGNOSIS — M7502 Adhesive capsulitis of left shoulder: Secondary | ICD-10-CM | POA: Diagnosis not present

## 2020-08-18 DIAGNOSIS — S63502A Unspecified sprain of left wrist, initial encounter: Secondary | ICD-10-CM | POA: Diagnosis not present

## 2020-08-18 DIAGNOSIS — M25512 Pain in left shoulder: Secondary | ICD-10-CM | POA: Diagnosis not present

## 2020-08-18 DIAGNOSIS — M25532 Pain in left wrist: Secondary | ICD-10-CM | POA: Diagnosis not present

## 2020-08-18 DIAGNOSIS — M25522 Pain in left elbow: Secondary | ICD-10-CM | POA: Diagnosis not present

## 2020-08-23 DIAGNOSIS — M25512 Pain in left shoulder: Secondary | ICD-10-CM | POA: Diagnosis not present

## 2020-08-23 DIAGNOSIS — M25522 Pain in left elbow: Secondary | ICD-10-CM | POA: Diagnosis not present

## 2020-08-23 DIAGNOSIS — M25532 Pain in left wrist: Secondary | ICD-10-CM | POA: Diagnosis not present

## 2020-08-23 DIAGNOSIS — S63502A Unspecified sprain of left wrist, initial encounter: Secondary | ICD-10-CM | POA: Diagnosis not present

## 2020-08-25 DIAGNOSIS — M25532 Pain in left wrist: Secondary | ICD-10-CM | POA: Diagnosis not present

## 2020-08-25 DIAGNOSIS — M25522 Pain in left elbow: Secondary | ICD-10-CM | POA: Diagnosis not present

## 2020-08-25 DIAGNOSIS — S63502A Unspecified sprain of left wrist, initial encounter: Secondary | ICD-10-CM | POA: Diagnosis not present

## 2020-08-25 DIAGNOSIS — M25512 Pain in left shoulder: Secondary | ICD-10-CM | POA: Diagnosis not present

## 2020-08-30 DIAGNOSIS — M25522 Pain in left elbow: Secondary | ICD-10-CM | POA: Diagnosis not present

## 2020-08-30 DIAGNOSIS — M25532 Pain in left wrist: Secondary | ICD-10-CM | POA: Diagnosis not present

## 2020-08-30 DIAGNOSIS — M25512 Pain in left shoulder: Secondary | ICD-10-CM | POA: Diagnosis not present

## 2020-08-30 DIAGNOSIS — S63502A Unspecified sprain of left wrist, initial encounter: Secondary | ICD-10-CM | POA: Diagnosis not present

## 2020-09-01 DIAGNOSIS — M47816 Spondylosis without myelopathy or radiculopathy, lumbar region: Secondary | ICD-10-CM | POA: Diagnosis not present

## 2020-09-05 DIAGNOSIS — M25532 Pain in left wrist: Secondary | ICD-10-CM | POA: Diagnosis not present

## 2020-09-05 DIAGNOSIS — M25551 Pain in right hip: Secondary | ICD-10-CM | POA: Diagnosis not present

## 2020-09-05 DIAGNOSIS — S63502A Unspecified sprain of left wrist, initial encounter: Secondary | ICD-10-CM | POA: Diagnosis not present

## 2020-09-05 DIAGNOSIS — M25522 Pain in left elbow: Secondary | ICD-10-CM | POA: Diagnosis not present

## 2020-09-05 DIAGNOSIS — M25512 Pain in left shoulder: Secondary | ICD-10-CM | POA: Diagnosis not present

## 2020-09-06 DIAGNOSIS — I8310 Varicose veins of unspecified lower extremity with inflammation: Secondary | ICD-10-CM | POA: Diagnosis not present

## 2020-09-08 DIAGNOSIS — M25532 Pain in left wrist: Secondary | ICD-10-CM | POA: Diagnosis not present

## 2020-09-08 DIAGNOSIS — M25512 Pain in left shoulder: Secondary | ICD-10-CM | POA: Diagnosis not present

## 2020-09-08 DIAGNOSIS — M25522 Pain in left elbow: Secondary | ICD-10-CM | POA: Diagnosis not present

## 2020-09-08 DIAGNOSIS — S63502A Unspecified sprain of left wrist, initial encounter: Secondary | ICD-10-CM | POA: Diagnosis not present

## 2020-09-13 DIAGNOSIS — M25532 Pain in left wrist: Secondary | ICD-10-CM | POA: Diagnosis not present

## 2020-09-13 DIAGNOSIS — S63502A Unspecified sprain of left wrist, initial encounter: Secondary | ICD-10-CM | POA: Diagnosis not present

## 2020-09-13 DIAGNOSIS — M25512 Pain in left shoulder: Secondary | ICD-10-CM | POA: Diagnosis not present

## 2020-09-13 DIAGNOSIS — M25522 Pain in left elbow: Secondary | ICD-10-CM | POA: Diagnosis not present

## 2020-09-14 ENCOUNTER — Other Ambulatory Visit (HOSPITAL_BASED_OUTPATIENT_CLINIC_OR_DEPARTMENT_OTHER): Payer: Self-pay | Admitting: Chiropractic Medicine

## 2020-09-14 DIAGNOSIS — M47816 Spondylosis without myelopathy or radiculopathy, lumbar region: Secondary | ICD-10-CM | POA: Diagnosis not present

## 2020-09-14 MED FILL — traMADol HCL 50 MG TABS: 50 | 6 days supply | Qty: 20 | Fill #0

## 2020-09-15 DIAGNOSIS — M25522 Pain in left elbow: Secondary | ICD-10-CM | POA: Diagnosis not present

## 2020-09-15 DIAGNOSIS — S63502A Unspecified sprain of left wrist, initial encounter: Secondary | ICD-10-CM | POA: Diagnosis not present

## 2020-09-15 DIAGNOSIS — M25512 Pain in left shoulder: Secondary | ICD-10-CM | POA: Diagnosis not present

## 2020-09-15 DIAGNOSIS — M25532 Pain in left wrist: Secondary | ICD-10-CM | POA: Diagnosis not present

## 2020-09-16 ENCOUNTER — Encounter: Payer: Medicare Other | Admitting: Obstetrics & Gynecology

## 2020-09-20 ENCOUNTER — Other Ambulatory Visit: Payer: Self-pay

## 2020-09-20 ENCOUNTER — Encounter: Payer: Self-pay | Admitting: Obstetrics & Gynecology

## 2020-09-20 ENCOUNTER — Ambulatory Visit (INDEPENDENT_AMBULATORY_CARE_PROVIDER_SITE_OTHER): Payer: Medicare Other | Admitting: Obstetrics & Gynecology

## 2020-09-20 VITALS — BP 126/88 | Ht 61.0 in | Wt 238.0 lb

## 2020-09-20 DIAGNOSIS — Z1272 Encounter for screening for malignant neoplasm of vagina: Secondary | ICD-10-CM

## 2020-09-20 DIAGNOSIS — C541 Malignant neoplasm of endometrium: Secondary | ICD-10-CM

## 2020-09-20 DIAGNOSIS — M25522 Pain in left elbow: Secondary | ICD-10-CM | POA: Diagnosis not present

## 2020-09-20 DIAGNOSIS — Z780 Asymptomatic menopausal state: Secondary | ICD-10-CM

## 2020-09-20 DIAGNOSIS — M85852 Other specified disorders of bone density and structure, left thigh: Secondary | ICD-10-CM | POA: Diagnosis not present

## 2020-09-20 DIAGNOSIS — Z90710 Acquired absence of both cervix and uterus: Secondary | ICD-10-CM | POA: Diagnosis not present

## 2020-09-20 DIAGNOSIS — M25532 Pain in left wrist: Secondary | ICD-10-CM | POA: Diagnosis not present

## 2020-09-20 DIAGNOSIS — Z9079 Acquired absence of other genital organ(s): Secondary | ICD-10-CM

## 2020-09-20 DIAGNOSIS — Z01419 Encounter for gynecological examination (general) (routine) without abnormal findings: Secondary | ICD-10-CM | POA: Diagnosis not present

## 2020-09-20 DIAGNOSIS — Z6841 Body Mass Index (BMI) 40.0 and over, adult: Secondary | ICD-10-CM

## 2020-09-20 DIAGNOSIS — M25512 Pain in left shoulder: Secondary | ICD-10-CM | POA: Diagnosis not present

## 2020-09-20 DIAGNOSIS — S63502A Unspecified sprain of left wrist, initial encounter: Secondary | ICD-10-CM | POA: Diagnosis not present

## 2020-09-20 DIAGNOSIS — Z90722 Acquired absence of ovaries, bilateral: Secondary | ICD-10-CM

## 2020-09-20 NOTE — Addendum Note (Signed)
Addended by: Thurnell Garbe A on: 09/20/2020 02:23 PM   Modules accepted: Orders

## 2020-09-20 NOTE — Progress Notes (Signed)
Katelyn Fisher 1950/10/28 [id number]   History:    70 y.o. G2P2L3 1 adopted child. Married.  [id number] presenting for annual gyn exam   HPI: Postmenopause, well on no HRT. 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 Had a total hysterectomy with bilateral salpingo-oophorectomy and staging and postop chemotherapy and radiation treatment which finished in 2016. Released by Gynecologic oncology.  Last year's Pap ASCUS/HPV HR negative.No pelvic pain. Breasts normal. [facility]. Health labs with her family physician. BMI 44.97.  Had a CS injection for Back pain, surgery planned in 2 weeks.   Past medical history,surgical history, family history and social history were all reviewed and documented in the EPIC chart.  Gynecologic History No LMP recorded. Patient has had a hysterectomy.  Obstetric History OB History  Gravida Para Term Preterm AB Living  2 2       3   SAB IAB Ectopic Multiple Live Births               # Outcome Date GA Lbr Len/2nd Weight Sex Delivery Anes PTL Lv  2 Para           1 Para             Obstetric Comments  1 adopted child     ROS: A ROS was performed and pertinent positives and negatives are included in the history.  GENERAL: No fevers or chills. HEENT: No change in vision, no earache, sore throat or sinus congestion. NECK: No pain or stiffness. CARDIOVASCULAR: No chest pain or pressure. No palpitations. PULMONARY: No shortness of breath, cough or wheeze. GASTROINTESTINAL: No abdominal pain, nausea, vomiting or diarrhea, melena or bright red blood per rectum. GENITOURINARY: No urinary frequency, urgency, hesitancy or dysuria. MUSCULOSKELETAL: No joint or muscle pain, no back pain, no recent trauma. DERMATOLOGIC: No rash, no itching, no lesions. ENDOCRINE: No polyuria, polydipsia, no heat or cold intolerance. No recent change in weight. HEMATOLOGICAL: No anemia or  easy bruising or bleeding. NEUROLOGIC: No headache, seizures, numbness, tingling or weakness. PSYCHIATRIC: No depression, no loss of interest in normal activity or change in sleep pattern.     Exam:   BP 126/88   Ht 5\' 1"  (1.549 m)   Wt 238 lb (108 kg)   BMI 44.97 kg/m   Body mass index is 44.97 kg/m.  General appearance : Well developed well nourished female. No acute distress HEENT: Eyes: no retinal hemorrhage or exudates,  Neck supple, trachea midline, no carotid bruits, no thyroidmegaly Lungs: Clear to auscultation, no rhonchi or wheezes, or rib retractions  Heart: Regular rate and rhythm, no murmurs or gallops Breast:Examined in sitting and supine position were symmetrical in appearance, no palpable masses or tenderness,  no skin retraction, no nipple inversion, no nipple discharge, no skin discoloration, no axillary or supraclavicular lymphadenopathy Abdomen: no palpable masses or tenderness, no rebound or guarding Extremities: no edema or skin discoloration or tenderness  Pelvic: Vulva: Normal             Vagina: No gross lesions or discharge.  Pap reflex done  Cervix/Uterus absent  Adnexa  Without masses or tenderness  Anus: Normal   Assessment/Plan:  70 y.o. female for annual exam   1. Encounter for Papanicolaou smear of vagina as part of routine gynecological examination Gynecologic exam status post TAH/BSO.  Last years Pap test showed ASCUS with negative high-risk HPV, Pap reflex done today.  Breast exam normal.  Screening mammogram May 2021  was negative.  Colonoscopy 2018.  Health labs with family physician.  2. S/P TAH-BSO  3. Endometrial cancer ([place]) Pap tests annually.  4. Postmenopause Well on no HRT.  5. Osteopenia of neck of left femur Very mild Osteopenia with T-Score at -1.2 at the Lt femoral Neck on BD 09/2017.  Will repeat BD at 5 years.  Continue with vitamin D supplements, calcium intake total of 1.5 g/day and regular weightbearing physical  activities recommended.  6. Class 3 severe obesity due to excess calories with serious comorbidity and body mass index (BMI) of 40.0 to 44.9 in adult [facility]) Recommend a lower calorie/carb diet.  Aerobic activities 5 times a week and light weightlifting every 2 days as soon as her back surgery and healing from it is done.  Princess Bruins MD, 1:55 PM 09/20/2020

## 2020-09-22 DIAGNOSIS — M25512 Pain in left shoulder: Secondary | ICD-10-CM | POA: Diagnosis not present

## 2020-09-22 DIAGNOSIS — M25522 Pain in left elbow: Secondary | ICD-10-CM | POA: Diagnosis not present

## 2020-09-22 DIAGNOSIS — S63502A Unspecified sprain of left wrist, initial encounter: Secondary | ICD-10-CM | POA: Diagnosis not present

## 2020-09-22 DIAGNOSIS — M25532 Pain in left wrist: Secondary | ICD-10-CM | POA: Diagnosis not present

## 2020-09-23 DIAGNOSIS — M47896 Other spondylosis, lumbar region: Secondary | ICD-10-CM | POA: Diagnosis not present

## 2020-09-27 DIAGNOSIS — M25512 Pain in left shoulder: Secondary | ICD-10-CM | POA: Diagnosis not present

## 2020-09-27 DIAGNOSIS — S63502A Unspecified sprain of left wrist, initial encounter: Secondary | ICD-10-CM | POA: Diagnosis not present

## 2020-09-27 DIAGNOSIS — M25532 Pain in left wrist: Secondary | ICD-10-CM | POA: Diagnosis not present

## 2020-09-27 DIAGNOSIS — M25522 Pain in left elbow: Secondary | ICD-10-CM | POA: Diagnosis not present

## 2020-09-29 DIAGNOSIS — S63502A Unspecified sprain of left wrist, initial encounter: Secondary | ICD-10-CM | POA: Diagnosis not present

## 2020-09-29 DIAGNOSIS — S52515D Nondisplaced fracture of left radial styloid process, subsequent encounter for closed fracture with routine healing: Secondary | ICD-10-CM | POA: Diagnosis not present

## 2020-09-29 DIAGNOSIS — M25532 Pain in left wrist: Secondary | ICD-10-CM | POA: Diagnosis not present

## 2020-09-29 DIAGNOSIS — M25522 Pain in left elbow: Secondary | ICD-10-CM | POA: Diagnosis not present

## 2020-09-29 DIAGNOSIS — M25512 Pain in left shoulder: Secondary | ICD-10-CM | POA: Diagnosis not present

## 2020-09-29 DIAGNOSIS — M7502 Adhesive capsulitis of left shoulder: Secondary | ICD-10-CM | POA: Diagnosis not present

## 2020-09-29 LAB — HUMAN PAPILLOMAVIRUS, HIGH RISK: HPV DNA High Risk: NOT DETECTED

## 2020-09-29 LAB — PAP IG W/ RFLX HPV ASCU

## 2020-09-30 ENCOUNTER — Encounter: Payer: Self-pay | Admitting: Obstetrics and Gynecology

## 2020-09-30 ENCOUNTER — Other Ambulatory Visit: Payer: Self-pay | Admitting: *Deleted

## 2020-09-30 DIAGNOSIS — R87610 Atypical squamous cells of undetermined significance on cytologic smear of cervix (ASC-US): Secondary | ICD-10-CM

## 2020-10-03 ENCOUNTER — Encounter: Payer: Self-pay | Admitting: Family Medicine

## 2020-10-03 ENCOUNTER — Other Ambulatory Visit: Payer: Self-pay

## 2020-10-03 ENCOUNTER — Ambulatory Visit (INDEPENDENT_AMBULATORY_CARE_PROVIDER_SITE_OTHER): Payer: Medicare Other | Admitting: Family Medicine

## 2020-10-03 VITALS — BP 136/80 | HR 77 | Resp 18 | Ht 61.0 in | Wt 242.0 lb

## 2020-10-03 DIAGNOSIS — T451X5A Adverse effect of antineoplastic and immunosuppressive drugs, initial encounter: Secondary | ICD-10-CM | POA: Diagnosis not present

## 2020-10-03 DIAGNOSIS — D701 Agranulocytosis secondary to cancer chemotherapy: Secondary | ICD-10-CM

## 2020-10-03 DIAGNOSIS — E1169 Type 2 diabetes mellitus with other specified complication: Secondary | ICD-10-CM | POA: Diagnosis not present

## 2020-10-03 DIAGNOSIS — M25551 Pain in right hip: Secondary | ICD-10-CM | POA: Diagnosis not present

## 2020-10-03 DIAGNOSIS — Z0000 Encounter for general adult medical examination without abnormal findings: Secondary | ICD-10-CM | POA: Diagnosis not present

## 2020-10-03 DIAGNOSIS — E669 Obesity, unspecified: Secondary | ICD-10-CM | POA: Diagnosis not present

## 2020-10-03 DIAGNOSIS — Z1331 Encounter for screening for depression: Secondary | ICD-10-CM

## 2020-10-03 LAB — LIPID PANEL
Cholesterol: 161 mg/dL (ref 0–200)
HDL: 64.9 mg/dL (ref >39.00)
LDL Cholesterol: 75 mg/dL (ref 0–99)
NonHDL: 95.71
Total CHOL/HDL Ratio: 2
Triglycerides: 106 mg/dL (ref 0.0–149.0)
VLDL: 21.2 mg/dL (ref 0.0–40.0)

## 2020-10-03 LAB — CBC
HCT: 39.1 % (ref 36.0–46.0)
Hemoglobin: 12.2 g/dL (ref 12.0–15.0)
MCHC: 31.1 g/dL (ref 30.0–36.0)
MCV: 68.6 fl — ABNORMAL LOW (ref 78.0–100.0)
Platelets: 189 10*3/uL (ref 150.0–400.0)
RBC: 5.69 Mil/uL — ABNORMAL HIGH (ref 3.87–5.11)
RDW: 15.8 % — ABNORMAL HIGH (ref 11.5–15.5)
WBC: 6.4 10*3/uL (ref 4.0–10.5)

## 2020-10-03 LAB — COMPREHENSIVE METABOLIC PANEL
ALT: 24 U/L (ref 0–35)
AST: 20 U/L (ref 0–37)
Albumin: 3.8 g/dL (ref 3.5–5.2)
Alkaline Phosphatase: 115 U/L (ref 39–117)
BUN: 12 mg/dL (ref 6–23)
CO2: 31 mEq/L (ref 19–32)
Calcium: 9.3 mg/dL (ref 8.4–10.5)
Chloride: 103 mEq/L (ref 96–112)
Creatinine, Ser: 0.77 mg/dL (ref 0.40–1.20)
GFR: 79.06 mL/min (ref >60.00)
Glucose, Bld: 116 mg/dL — ABNORMAL HIGH (ref 70–99)
Potassium: 4.2 mEq/L (ref 3.5–5.1)
Sodium: 140 mEq/L (ref 135–145)
Total Bilirubin: 0.4 mg/dL (ref 0.2–1.2)
Total Protein: 6.9 g/dL (ref 6.0–8.3)

## 2020-10-03 LAB — HEMOGLOBIN A1C: Hgb A1c MFr Bld: 7.1 % — ABNORMAL HIGH (ref 4.6–6.5)

## 2020-10-03 LAB — MICROALBUMIN / CREATININE URINE RATIO
Creatinine,U: 19.4 mg/dL
Microalb Creat Ratio: 3.6 mg/g (ref 0.0–30.0)
Microalb, Ur: <0.7 mg/dL (ref 0.0–1.9)

## 2020-10-03 NOTE — Patient Instructions (Signed)
Give [place] 2-3 business days to get the results of your labs back.   Keep the diet clean and stay active.  The new Shingrix vaccine (for shingles) is a 2 shot series. It can make people feel low energy, achy and almost like they have the flu for 48 hours after injection. Please plan accordingly when deciding on when to get this shot. Call your pharmacy to inquire about getting this. The second shot of the series is less severe regarding the side effects, but it still lasts 48 hours.   Let us know if you need anything.

## 2020-10-03 NOTE — Progress Notes (Signed)
Chief Complaint  Patient presents with  . Annual Exam     Well Woman Katelyn Fisher is here for a complete physical.   Her last physical was >1 year ago.  Current diet: in general, a "healthy" diet. Current exercise: nothing. Weight is stable and she denies daytime fatigue. Seatbelt? Yes Loss of interested in doing things or depression in past 2 weeks? No  Health Maintenance Colonoscopy- Yes Shingrix- No DEXA- Yes Mammogram- Yes Tetanus- Yes Pneumonia- Yes Hep C screen- Yes  Past Medical History:  Diagnosis Date  . Anxiety   . Arthritis    back  . Asthma   . Depression   . Endometrial cancer ([place]) 2015  . Family history of breast cancer   . Family history of colon cancer   . Family history of ovarian cancer   . GERD (gastroesophageal reflux disease)   . History of colonic diverticulitis   . Hx of colonic polyps   . Hyperlipidemia   . Hypertension   . 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  . Osteopenia 09/2017   T score -1.2 FRAX 3.2% / 0.3%  . Personal history of chemotherapy 2015  . Radiation 12/17/14-01/03/15   vaginal brachytherapy  . Thyroid disease 06/01/2013     Past Surgical History:  Procedure Laterality Date  . ABDOMINAL HYSTERECTOMY  06/23/14   [facility], TRH/BSO  . CHOLECYSTECTOMY  1991  . DILATION AND CURETTAGE OF UTERUS    . IR REMOVAL TUN ACCESS W/ PORT W/O FL MOD SED  04/30/2017  . TUBAL LIGATION      Medications  Current Outpatient Medications on File Prior to Visit  Medication Sig Dispense Refill  . albuterol (PROAIR HFA) 108 (90 Base) MCG/ACT inhaler 2 puffs every 4 hours as needed for coughing or wheezing spells 18 g 1  . albuterol (PROVENTIL) (2.5 MG/3ML) 0.083% nebulizer solution Take 3 mLs (2.5 mg total) by nebulization every 4 (four) hours as needed for wheezing or shortness of breath. 150 mL 2  . amLODipine (NORVASC) 10 MG tablet Take 1 tablet (10 mg total) by mouth daily. 90 tablet 1  . aspirin 81 MG tablet Take 81 mg by mouth daily.    Marland Kitchen  atorvastatin (LIPITOR) 40 MG tablet Take 1 tablet (40 mg total) by mouth daily. 90 tablet 1  . B Complex CAPS Take by mouth.    . Baclofen 5 MG TABS Take 5 mg by mouth 2 (two) times daily as needed. 40 tablet 0  . benazepril (LOTENSIN) 20 MG tablet Take 1 tablet (20 mg total) by mouth at bedtime. 90 tablet 1  . benzonatate (TESSALON) 100 MG capsule Take 1 capsule (100 mg total) by mouth 3 (three) times daily as needed. 30 capsule 0  . betamethasone valerate ointment (VALISONE) 0.1 % Apply 1 application topically as needed.     . budesonide-formoterol (SYMBICORT) 160-4.5 MCG/ACT inhaler Inhale 2 puffs into the lungs as needed.     . carvedilol (COREG) 12.5 MG tablet Take 1 tablet (12.5 mg total) by mouth 2 (two) times daily with a meal. 180 tablet 1  . Diclofenac Sodium 3 % GEL Apply topically as needed.     Marland Kitchen EPINEPHrine (EPIPEN 2-PAK) 0.3 mg/0.3 mL IJ SOAJ injection Use as directed for severe allergic reaction 2 each 3  . famotidine (PEPCID) 20 MG tablet Take 1 tablet (20 mg total) by mouth 2 (two) times daily. (Patient taking differently: Take 20 mg by mouth as needed.) 60 tablet 5  . furosemide (LASIX) 20 MG  tablet Take 1 tablet (20 mg total) by mouth daily. 90 tablet 1  . levocetirizine (XYZAL) 5 MG tablet Take 1 tablet (5 mg total) by mouth every evening. (Patient taking differently: Take 5 mg by mouth as needed.) 90 tablet 2  . Multiple Vitamin (MULTIVITAMIN) tablet Take 1 tablet by mouth daily.    . traMADol (ULTRAM) 50 MG tablet Take 1 tablet (50 mg total) by mouth every 6 (six) hours as needed. 10 tablet 0  . fluticasone (FLOVENT HFA) 110 MCG/ACT inhaler Inhale 2 puffs into the lungs in the morning and at bedtime for 14 days. 1 each 0   Allergies Allergies  Allergen Reactions  . Shellfish Allergy Hives, Shortness Of Breath and Swelling  . Oxycodone Swelling    Facial swelling and tightness  . Penicillins Swelling    Rash with welps  . Iodinated Diagnostic Agents Rash  . Sulfa  Antibiotics Rash    Review of Systems: Constitutional:  no fevers Eye:  no recent significant change in vision Ears:  No changes in hearing Nose/Mouth/Throat:  no complaints of nasal congestion, no sore throat Cardiovascular: no chest pain Respiratory:  No shortness of breath Gastrointestinal:  No change in bowel habits GU:  Female: negative for dysuria Integumentary:  no abnormal skin lesions reported Neurologic:  no headaches Endocrine:  denies unexplained weight changes  Exam BP 136/80 (BP Location: Left Arm, Patient Position: Sitting, Cuff Size: Normal)   Pulse 77   Resp 18   Ht 5\' 1"  (1.549 m)   Wt 242 lb (109.8 kg)   SpO2 99%   BMI 45.73 kg/m  General:  well developed, well nourished, in no apparent distress Skin:  no significant moles, warts, or growths Head:  no masses, lesions, or tenderness Eyes:  pupils equal and round, sclera anicteric without injection Ears:  canals without lesions, TMs shiny without retraction, no obvious effusion, no erythema Nose:  nares patent, septum midline, mucosa normal, and no drainage or sinus tenderness Throat/Pharynx:  lips and gingiva without lesion; tongue and uvula midline; non-inflamed pharynx; no exudates or postnasal drainage Neck: neck supple without adenopathy, thyromegaly, or masses Lungs:  clear to auscultation, breath sounds equal bilaterally, no respiratory distress Cardio:  regular rate and rhythm, no bruits or LE edema Abdomen:  abdomen soft, nontender; bowel sounds normal; no masses or organomegaly Genital: Deferred Neuro:  gait normal; deep tendon reflexes normal and symmetric Psych: well oriented with normal range of affect and appropriate judgment/insight  Assessment and Plan  Well adult exam  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 female. Counseled on diet and exercise. Other orders as above. Shingrix rec'd.  Follow up in 6 mo or prn. The patient voiced understanding and agreement to the plan.  [place], DO 10/03/20 8:27 AM

## 2020-10-10 ENCOUNTER — Encounter: Payer: Self-pay | Admitting: Allergy & Immunology

## 2020-10-10 ENCOUNTER — Ambulatory Visit (INDEPENDENT_AMBULATORY_CARE_PROVIDER_SITE_OTHER): Payer: Medicare Other | Admitting: Allergy & Immunology

## 2020-10-10 ENCOUNTER — Other Ambulatory Visit: Payer: Self-pay

## 2020-10-10 ENCOUNTER — Ambulatory Visit: Payer: Medicare Other | Admitting: Allergy and Immunology

## 2020-10-10 VITALS — BP 148/90 | HR 84 | Temp 97.7°F | Resp 20 | Ht 61.0 in | Wt 240.5 lb

## 2020-10-10 DIAGNOSIS — T7800XD Anaphylactic reaction due to unspecified food, subsequent encounter: Secondary | ICD-10-CM

## 2020-10-10 DIAGNOSIS — J4540 Moderate persistent asthma, uncomplicated: Secondary | ICD-10-CM

## 2020-10-10 DIAGNOSIS — K219 Gastro-esophageal reflux disease without esophagitis: Secondary | ICD-10-CM

## 2020-10-10 NOTE — Patient Instructions (Addendum)
1. Moderate persistent asthma without complication - Lung testing looks great today. - We are not going to make any changes today. - Daily controller medication(s): Symbicort 160/4.90mcg two puffs twice daily with spacer - Prior to physical activity: albuterol 2 puffs 10-15 minutes before physical activity. - Rescue medications: albuterol 4 puffs every 4-6 hours as needed - Changes during respiratory infections or worsening symptoms: Increase Symbicort 19mcg to 2 puffs twice daily for TWO WEEKS. - Asthma control goals:  * Full participation in all desired activities (may need albuterol before activity) * Albuterol use two time or less a week on average (not counting use with activity) * Cough interfering with sleep two time or less a month * Oral steroids no more than once a year * No hospitalizations  2. Gastroesophageal reflux disease - Continue with Pepcid (famotidine) as needed. - Avoid triggering foods as you are doing.   3. Anaphylactic shock due to food - Continue to avoid shellfish. - EpiPen is up to date.    4. Allergic rhinitis - Continue with cetirizine 10mg  as needed. - Continue with fluticasone one spray per nostril as needed.  5. Return in about 6 months (around 04/12/2021).    Please inform us of any Emergency Department visits, hospitalizations, or changes in symptoms. Call us before going to the ED for breathing or allergy symptoms since we might be able to fit you in for a sick visit. Feel free to contact us anytime with any questions, problems, or concerns.  It was a pleasure to meet you today!  Websites that have reliable patient information: 1. American Academy of Asthma, Allergy, and Immunology: www.aaaai.org 2. Food Allergy Research and Education (FARE): foodallergy.org 3. Mothers of Asthmatics: http://www.asthmacommunitynetwork.org 4. American College of Allergy, Asthma, and Immunology: www.acaai.org   COVID-19 Vaccine Information can be found at:  [email or web address] For questions related to vaccine distribution or appointments, please email [email or web address] or call [phone].   We realize that you might be concerned about having an allergic reaction to the COVID19 vaccines. To help with that concern, WE ARE OFFERING THE COVID19 VACCINES IN OUR OFFICE! Ask the front desk for dates!     "Like" [place] on Facebook and Instagram for our latest updates!      A healthy democracy works best when [employer] participate! Make sure you are registered to vote! If you have moved or changed any of your contact information, you will need to get this updated before voting!  In some cases, you MAY be able to register to vote online: [email or web address]

## 2020-10-10 NOTE — Progress Notes (Signed)
FOLLOW UP  Date of Service/Encounter:  10/10/20   Assessment:   Moderate persistent asthma, uncomplicated  Perennial and seasonal allergic rhinitis  GERD  Food allergies (shellfish)  Plan/Recommendations:   1. Moderate persistent asthma without complication - Lung testing looks great today. - We are not going to make any changes today. - Daily controller medication(s): Symbicort 160/4.19mcg two puffs twice daily with spacer - Prior to physical activity: albuterol 2 puffs 10-15 minutes before physical activity. - Rescue medications: albuterol 4 puffs every 4-6 hours as needed - Changes during respiratory infections or worsening symptoms: Increase Symbicort 154mcg to 2 puffs twice daily for TWO WEEKS. - Asthma control goals:  * Full participation in all desired activities (may need albuterol before activity) * Albuterol use two time or less a week on average (not counting use with activity) * Cough interfering with sleep two time or less a month * Oral steroids no more than once a year * No hospitalizations  2. Gastroesophageal reflux disease - Continue with Pepcid (famotidine) as needed. - Avoid triggering foods as you are doing.   3. Anaphylactic shock due to food - Continue to avoid shellfish. - EpiPen is up to date.    4. Allergic rhinitis - Continue with cetirizine 10mg  as needed. - Continue with fluticasone one spray per nostril as needed.  5. Return in about 6 months (around 04/12/2021).   Subjective:   Katelyn Fisher is a 70 y.o. female presenting today for follow up of  Chief Complaint  Patient presents with  . Allergic Rhinitis   . Asthma    [name] has a history of the following: Patient Active Problem List   Diagnosis Date Noted  . Mass of left elbow 09/14/2019  . Flank pain 09/14/2019  . Subacromial impingement of left shoulder 12/30/2018  . Moderate persistent asthma without complication 26/94/8546  . Current use of beta blocker  06/30/2018  . Allergic rhinitis 06/30/2018  . Acute bronchitis due to other specified organisms 06/30/2018  . History of endometrial cancer 02/03/2018  . Screening Mammogram 11/05/2017  . 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  . Type 2 diabetes mellitus with diabetic neuropathy, without long-term current use of insulin ([place]) 10/25/2016  . Microcytosis 09/28/2016  . Mild intermittent asthma without complication 27/09/5007  . Port catheter in place 11/15/2015  . Chemotherapy induced neutropenia ([place]) 11/29/2014  . Family history of breast cancer   . Family history of ovarian cancer   . Family history of prostate cancer   . Family history of colon cancer   . Morbid obesity ([place]) 11/09/2014  . Chemotherapy-induced peripheral neuropathy ([place]) 09/22/2014  . H/O total hysterectomy with bilateral salpingo-oophorectomy (BSO) 06/24/2014  . Endometrial cancer ([place]) 05/20/2014  . 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  . Thyroid disease 06/01/2013  . Low back pain radiating to right leg 08/18/2012  . Anemia 11/07/2011  . Hyperlipidemia 06/15/2011  . Diabetes mellitus type 2 in obese ([place]) 03/12/2011  . Acute pain of right knee 01/27/2011  . Obstructive sleep apnea 11/15/2010  . Osteoarthritis 11/15/2010  . Gastroesophageal reflux disease without esophagitis 09/14/2010  . DIVERTICULITIS, HX OF 09/14/2010  . POSTMENOPAUSAL STATUS 10/01/2008  . COLONIC POLYPS, HX OF 03/10/2008  . Essential hypertension 08/13/2006    History obtained from: chart review and patient.  Katelyn Fisher is a 70 y.o. female presenting for a follow up visit.  She was last seen in October 2021.  At that time, she was continued on Symbicort 160 mcg 1  puff twice daily with a spacer.  For her allergic rhinitis, she was continued on cetirizine as well as Flonase and nasal saline.  Her GERD was under good control with famotidine as well as reflux precautions.  She continue to avoid shellfish.  EpiPen was  up-to-date.  Since last visit, she has done very well.   Asthma/Respiratory Symptom History: She did have some breathing issues in January. She was behind an old truck and she could not pass him. There were fumes coming out and she started having issues with coughing and wheezing. She did not have to go to the hospital. She had a virtual visit with Dr. Nani Ravens. She was given Tessalon pearls and she used her inhaler. Symptoms continued for three days. She remains on Symbicort one puff once daily. She is unsure how long she has been on that. She has two insurances so it is covered.   Allergic Rhinitis Symptom History: She is using her medications as needed. She has never been on allergy shots. She thinks that she was tested 10+ years ago by Dr. Shaune Leeks. She does not often require antibiotics at all.   Food Allergy Symptom History: She has avoided shellfish. She denies accidental ingestions. EpiPen is up to date.   She has been somewhat tired lately, but she spends a lot of time chasing her grandchildren and one great grandchild around. She has been married 91 years to her husband.   Otherwise, there have been no changes to her past medical history, surgical history, family history, or social history.    Review of Systems  Constitutional: Negative.  Negative for chills, fever, malaise/fatigue and weight loss.  HENT: Negative for congestion, ear discharge, ear pain and sinus pain.   Eyes: Negative for pain, discharge and redness.  Respiratory: Positive for shortness of breath. Negative for cough, sputum production and wheezing.   Cardiovascular: Negative.  Negative for chest pain and palpitations.  Gastrointestinal: Negative for abdominal pain, constipation, diarrhea, heartburn, nausea and vomiting.  Skin: Negative.  Negative for itching and rash.  Neurological: Negative for dizziness and headaches.  Endo/Heme/Allergies: Positive for environmental allergies. Does not bruise/bleed easily.        Objective:   Blood pressure (!) 148/90, pulse 84, temperature 97.7 F (36.5 C), temperature source Temporal, resp. rate 20, height 5\' 1"  (1.549 m), weight 240 lb 8.4 oz (109.1 kg), SpO2 98 %. Body mass index is 45.45 kg/m.   Physical Exam:  Physical Exam Vitals reviewed.  Constitutional:      Appearance: She is well-developed.     Comments: Cooperative with the exam.  HENT:     Head: Normocephalic and atraumatic.     Right Ear: Tympanic membrane, ear canal and external ear normal.     Left Ear: Tympanic membrane, ear canal and external ear normal.     Nose: No nasal deformity, septal deviation, mucosal edema or rhinorrhea.     Right Turbinates: Enlarged and swollen.     Left Turbinates: Enlarged and swollen.     Right Sinus: No maxillary sinus tenderness or frontal sinus tenderness.     Left Sinus: No maxillary sinus tenderness or frontal sinus tenderness.     Mouth/Throat:     Mouth: Mucous membranes are not pale and not dry.     Pharynx: Uvula midline.  Eyes:     General: Allergic shiner present.        Right eye: No discharge.        Left eye: No discharge.  Conjunctiva/sclera: Conjunctivae normal.     Right eye: Right conjunctiva is not injected. No chemosis.    Left eye: Left conjunctiva is not injected. No chemosis.    Pupils: Pupils are equal, round, and reactive to light.  Cardiovascular:     Rate and Rhythm: Normal rate and regular rhythm.     Heart sounds: Normal heart sounds.  Pulmonary:     Effort: Pulmonary effort is normal. No tachypnea, accessory muscle usage or respiratory distress.     Breath sounds: Normal breath sounds. No wheezing, rhonchi or rales.     Comments: Moving air well in all lung fields. Chest:     Chest wall: No tenderness.  Musculoskeletal:     Cervical back: Full passive range of motion without pain.  Lymphadenopathy:     Cervical: No cervical adenopathy.  Skin:    General: Skin is warm.     Capillary Refill: Capillary  refill takes less than 2 seconds.     Coloration: Skin is not pale.     Findings: No abrasion, erythema, petechiae or rash. Rash is not papular, urticarial or vesicular.  Neurological:     Mental Status: She is alert.  Psychiatric:        Behavior: Behavior is cooperative.      Diagnostic studies:    Spirometry: results normal (FEV1: 1.45/89%, FVC: 1.80/86%, FEV1/FVC: 71%).    Spirometry consistent with normal pattern.   Allergy Studies: none        Salvatore Marvel, MD  [facility]

## 2020-10-14 ENCOUNTER — Other Ambulatory Visit (HOSPITAL_BASED_OUTPATIENT_CLINIC_OR_DEPARTMENT_OTHER): Payer: Self-pay | Admitting: Physical Medicine and Rehabilitation

## 2020-10-14 DIAGNOSIS — M47816 Spondylosis without myelopathy or radiculopathy, lumbar region: Secondary | ICD-10-CM | POA: Diagnosis not present

## 2020-10-14 MED FILL — traMADol HCL 50 MG TABS: 50 | 7 days supply | Qty: 20 | Fill #0

## 2020-10-17 ENCOUNTER — Other Ambulatory Visit (HOSPITAL_BASED_OUTPATIENT_CLINIC_OR_DEPARTMENT_OTHER): Payer: Self-pay | Admitting: Family Medicine

## 2020-10-17 ENCOUNTER — Other Ambulatory Visit (HOSPITAL_BASED_OUTPATIENT_CLINIC_OR_DEPARTMENT_OTHER): Payer: Self-pay | Admitting: Hematology & Oncology

## 2020-10-17 DIAGNOSIS — Z1231 Encounter for screening mammogram for malignant neoplasm of breast: Secondary | ICD-10-CM

## 2020-11-03 DIAGNOSIS — M25551 Pain in right hip: Secondary | ICD-10-CM | POA: Diagnosis not present

## 2020-11-14 ENCOUNTER — Other Ambulatory Visit: Payer: Self-pay

## 2020-11-14 ENCOUNTER — Other Ambulatory Visit (HOSPITAL_COMMUNITY)
Admission: RE | Admit: 2020-11-14 | Discharge: 2020-11-14 | Disposition: A | Discharge status: Home / Self Care | Payer: Medicare Other | Source: Ambulatory Visit | Attending: Obstetrics & Gynecology | Admitting: Obstetrics & Gynecology

## 2020-11-14 ENCOUNTER — Ambulatory Visit (INDEPENDENT_AMBULATORY_CARE_PROVIDER_SITE_OTHER): Payer: Medicare Other | Admitting: Obstetrics & Gynecology

## 2020-11-14 ENCOUNTER — Encounter: Payer: Self-pay | Admitting: Obstetrics & Gynecology

## 2020-11-14 VITALS — BP 126/88

## 2020-11-14 DIAGNOSIS — Z90710 Acquired absence of both cervix and uterus: Secondary | ICD-10-CM

## 2020-11-14 DIAGNOSIS — Z9079 Acquired absence of other genital organ(s): Secondary | ICD-10-CM

## 2020-11-14 DIAGNOSIS — Z90722 Acquired absence of ovaries, bilateral: Secondary | ICD-10-CM

## 2020-11-14 DIAGNOSIS — Z8542 Personal history of malignant neoplasm of other parts of uterus: Secondary | ICD-10-CM

## 2020-11-14 DIAGNOSIS — R87620 Atypical squamous cells of undetermined significance on cytologic smear of vagina (ASC-US): Secondary | ICD-10-CM | POA: Diagnosis not present

## 2020-11-14 DIAGNOSIS — R87610 Atypical squamous cells of undetermined significance on cytologic smear of cervix (ASC-US): Secondary | ICD-10-CM

## 2020-11-14 DIAGNOSIS — C541 Malignant neoplasm of endometrium: Secondary | ICD-10-CM

## 2020-11-14 DIAGNOSIS — E119 Type 2 diabetes mellitus without complications: Secondary | ICD-10-CM | POA: Diagnosis not present

## 2020-11-14 NOTE — Progress Notes (Signed)
    Katelyn Fisher 1951/05/02 [id number]        70 y.o.  G2P2L2  RP: ASCUS/HPV HR Neg x 2 for Colposcopy  HPI: ASCUS/HPV HR Neg x 2.   Postmenopause, well on no HRT. 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 Had a total hysterectomy with bilateral salpingo-oophorectomy and staging and postop chemotherapy and radiation treatment which finished in 2016.   OB History  Gravida Para Term Preterm AB Living  2 2       3   SAB IAB Ectopic Multiple Live Births               # Outcome Date GA Lbr Len/2nd Weight Sex Delivery Anes PTL Lv  2 Para           1 Para             Obstetric Comments  1 adopted child    Past medical history,surgical history, problem list, medications, allergies, family history and social history were all reviewed and documented in the EPIC chart.   Directed ROS with pertinent positives and negatives documented in the history of present illness/assessment and plan.  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 Had a total hysterectomy with bilateral salpingo-oophorectomy and staging and postop chemotherapy and radiation treatment which finished in 2016.  Procedure Details  The risks and benefits of the procedure and Verbal informed consent obtained.  Speculum placed in vagina and excellent visualization of the vaginal vault achieved, vaginal vault swabbed with acetic acid solution.  Findings:  Cervix colposcopy: Absent  Vaginal colposcopy:  AW with punctation at the right vaginal vault.  Biopsy taken.  Vulvar colposcopy:  Normal  Perirectal colposcopy:  Normal  The cervix was sprayed with Hurricane before performing the cervical biopsies.  Specimens: Rt vaginal vault biopsy  Complications: None, good hemostasis with  Silver Nitrate . Plan:  Management per vaginal biopsy results   Assessment/Plan:  70 y.o. G2P2   1. Atypical squamous cells of undetermined significance (ASC-US) on cervical Pap smear ASCUS/HPV HR neg x 2.  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 Colposcopy procedure and findings reviewed with patient.  Well tolerated, post procedure precautions reviewed. - Colposcopy - Vaginal Bx  2. Endometrial cancer ([place]) TAH/BSO Radiation/Chemo in 2016.  3. S/P TAH-BSO  Princess Bruins MD, 8:47 AM 11/14/2020

## 2020-11-14 NOTE — Addendum Note (Signed)
Addended by: Princess Bruins on: 11/14/2020 09:36 AM   Modules accepted: Orders

## 2020-11-15 ENCOUNTER — Other Ambulatory Visit (HOSPITAL_BASED_OUTPATIENT_CLINIC_OR_DEPARTMENT_OTHER): Payer: Self-pay

## 2020-11-15 LAB — SURGICAL PATHOLOGY

## 2020-11-15 MED ORDER — BETAMETHASONE DIPROPIONATE AUG 0.05 % EX OINT
TOPICAL_OINTMENT | CUTANEOUS | 0 refills | Status: DC
  Filled 2020-11-15: qty 100, 30d supply, fill #0

## 2020-11-16 ENCOUNTER — Other Ambulatory Visit (HOSPITAL_BASED_OUTPATIENT_CLINIC_OR_DEPARTMENT_OTHER): Payer: Self-pay

## 2020-11-29 ENCOUNTER — Ambulatory Visit: Payer: Medicare Other | Admitting: Obstetrics & Gynecology

## 2020-12-03 DIAGNOSIS — M25551 Pain in right hip: Secondary | ICD-10-CM | POA: Diagnosis not present

## 2020-12-05 ENCOUNTER — Other Ambulatory Visit: Payer: Self-pay

## 2020-12-05 ENCOUNTER — Ambulatory Visit (HOSPITAL_BASED_OUTPATIENT_CLINIC_OR_DEPARTMENT_OTHER)
Admission: RE | Admit: 2020-12-05 | Discharge: 2020-12-05 | Disposition: A | Discharge status: Home / Self Care | Payer: Medicare Other | Source: Ambulatory Visit | Attending: Family Medicine | Admitting: Family Medicine

## 2020-12-05 ENCOUNTER — Encounter (HOSPITAL_BASED_OUTPATIENT_CLINIC_OR_DEPARTMENT_OTHER): Payer: Self-pay

## 2020-12-05 DIAGNOSIS — Z1231 Encounter for screening mammogram for malignant neoplasm of breast: Secondary | ICD-10-CM | POA: Insufficient documentation

## 2020-12-12 ENCOUNTER — Other Ambulatory Visit (HOSPITAL_BASED_OUTPATIENT_CLINIC_OR_DEPARTMENT_OTHER): Payer: Self-pay

## 2020-12-12 ENCOUNTER — Telehealth: Payer: Self-pay | Admitting: Allergy & Immunology

## 2020-12-12 MED ORDER — ALBUTEROL SULFATE HFA 108 (90 BASE) MCG/ACT IN AERS
INHALATION_SPRAY | RESPIRATORY_TRACT | 1 refills | Status: DC
Start: 2020-12-12 — End: 2021-08-21
  Filled 2020-12-12: qty 8.5, 17d supply, fill #0

## 2020-12-12 NOTE — Telephone Encounter (Signed)
Pt request refill for albuterol (PROAIR HFA) would like prescription sent [facility]. Pt would also like a call back from a nurse.

## 2020-12-12 NOTE — Telephone Encounter (Signed)
Spoke with patient, she had was on the phone with her PCP going over medications and wanted to know how she was suppose to use the Symbicort. Advised Symbicort 160 mcg 2 puffs twice a day with spacer. ProAir is also sent to [facility] x 1 with 1 refill.

## 2020-12-14 ENCOUNTER — Other Ambulatory Visit (HOSPITAL_BASED_OUTPATIENT_CLINIC_OR_DEPARTMENT_OTHER): Payer: Self-pay

## 2020-12-21 ENCOUNTER — Encounter: Payer: Self-pay | Admitting: Family Medicine

## 2020-12-21 ENCOUNTER — Ambulatory Visit (INDEPENDENT_AMBULATORY_CARE_PROVIDER_SITE_OTHER): Payer: Medicare Other | Admitting: Family Medicine

## 2020-12-21 ENCOUNTER — Other Ambulatory Visit (HOSPITAL_BASED_OUTPATIENT_CLINIC_OR_DEPARTMENT_OTHER): Payer: Self-pay

## 2020-12-21 ENCOUNTER — Other Ambulatory Visit: Payer: Self-pay

## 2020-12-21 VITALS — BP 130/84 | HR 94 | Temp 97.9°F | Ht 63.0 in | Wt 248.4 lb

## 2020-12-21 DIAGNOSIS — R600 Localized edema: Secondary | ICD-10-CM | POA: Diagnosis not present

## 2020-12-21 NOTE — Progress Notes (Signed)
Chief Complaint  Patient presents with  . Foot Swelling    Katelyn Fisher here for bilateral leg swelling.  Duration: 2 months  Starts swelling once she lets it done. Hx of prolonged bedrest, recent surgery, travel or injury? No Pain the calf? No SOB? No Personal or family history of clot or bleeding disorder? No Hx of heart failure, renal failure, hepatic failure? No She does take Lasix but it starts working 60 min after taking.   Past Medical History:  Diagnosis Date  . Anxiety   . Arthritis    back  . Asthma   . Depression   . Endometrial cancer ([place]) 2015  . Family history of breast cancer   . Family history of colon cancer   . Family history of ovarian cancer   . GERD (gastroesophageal reflux disease)   . History of colonic diverticulitis   . Hx of colonic polyps   . Hyperlipidemia   . Hypertension   . 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  . Osteopenia 09/2017   T score -1.2 FRAX 3.2% / 0.3%  . Personal history of chemotherapy 2015  . Radiation 12/17/14-01/03/15   vaginal brachytherapy  . Thyroid disease 06/01/2013   Family History  Problem Relation Age of Onset  . Heart disease Maternal Aunt        x 3 -2 brothers  . Hypertension Maternal Aunt   . Breast cancer Maternal Aunt        maternal half; dx in her 42s  . Cancer Maternal Aunt        cervical  . Ovarian cancer Maternal Aunt        dx in her 60s  . Prostate cancer Father 3  . Diabetes Father   . Cancer Father        lung cancer, asbestos exposure  . Heart disease Brother   . Diabetes Brother   . Diabetes Mother   . Lupus Mother   . Heart disease Brother   . Hyperlipidemia Sister   . Heart disease Brother   . Diabetes Brother   . Colon polyps Brother   . Heart disease Brother   . Diabetes Brother   . Colon polyps Brother   . Colon cancer Maternal Grandmother        dx in her 56s  . Leukemia Maternal Uncle 21  . Cancer Paternal Aunt        NOS- breast   . Prostate cancer Paternal Uncle         dx in his 45s  . Breast cancer Other        dx in her 64s   Past Surgical History:  Procedure Laterality Date  . ABDOMINAL HYSTERECTOMY  06/23/14   [facility], TRH/BSO  . CHOLECYSTECTOMY  1991  . DILATION AND CURETTAGE OF UTERUS    . IR REMOVAL TUN ACCESS W/ PORT W/O FL MOD SED  04/30/2017  . TUBAL LIGATION      Current Outpatient Medications:  .  albuterol (PROAIR HFA) 108 (90 Base) MCG/ACT inhaler, inhale 2  puffs by mouth every 4 hours as needed for coughing or wheezing spells (Patient taking differently: inhale 2  puffs by mouth every 4 hours as needed for coughing or wheezing spells), Disp: 8.5 g, Rfl: 1 .  albuterol (PROVENTIL) (2.5 MG/3ML) 0.083% nebulizer solution, Take 3 mLs (2.5 mg total) by nebulization every 4 (four) hours as needed for wheezing or shortness of breath., Disp: 150 mL, Rfl: 2 .  amLODipine (NORVASC) 10 MG  tablet, Take 1 tablet (10 mg total) by mouth daily., Disp: 90 tablet, Rfl: 1 .  aspirin 81 MG tablet, Take 81 mg by mouth daily., Disp: , Rfl:  .  atorvastatin (LIPITOR) 40 MG tablet, Take 1 tablet (40 mg total) by mouth daily., Disp: 90 tablet, Rfl: 1 .  augmented betamethasone dipropionate (DIPROLENE-AF) 0.05 % ointment, Apply 1 application externally Once a day to affected area(s) as needed, Disp: 100 g, Rfl: 0 .  B Complex CAPS, Take by mouth., Disp: , Rfl:  .  benazepril (LOTENSIN) 20 MG tablet, Take 1 tablet (20 mg total) by mouth at bedtime., Disp: 90 tablet, Rfl: 1 .  betamethasone valerate ointment (VALISONE) 0.1 %, Apply 1 application topically as needed. , Disp: , Rfl:  .  budesonide-formoterol (SYMBICORT) 160-4.5 MCG/ACT inhaler, Inhale 2 puffs into the lungs as needed. , Disp: , Rfl:  .  carvedilol (COREG) 12.5 MG tablet, Take 1 tablet (12.5 mg total) by mouth 2 (two) times daily with a meal., Disp: 180 tablet, Rfl: 1 .  COVID-19 mRNA vaccine, Pfizer, 30 MCG/0.3ML injection, INJECT AS DIRECTED, Disp: .3 mL, Rfl: 0 .  EPINEPHrine (EPIPEN 2-PAK) 0.3  mg/0.3 mL IJ SOAJ injection, Use as directed for severe allergic reaction, Disp: 2 each, Rfl: 3 .  furosemide (LASIX) 20 MG tablet, Take 1 tablet (20 mg total) by mouth daily., Disp: 90 tablet, Rfl: 1 .  levocetirizine (XYZAL) 5 MG tablet, Take 1 tablet (5 mg total) by mouth every evening. (Patient taking differently: Take 5 mg by mouth as needed.), Disp: 90 tablet, Rfl: 2 .  Multiple Vitamin (MULTIVITAMIN) tablet, Take 1 tablet by mouth daily., Disp: , Rfl:  .  traMADol (ULTRAM) 50 MG tablet, TAKE ONE TABLET BY MOUTH THREE TIMES DAILY AS NEEDED, Disp: 20 tablet, Rfl: 0  Current Facility-Administered Medications:  .  0.9 %  sodium chloride infusion, 500 mL, Intravenous, Continuous, Pyrtle, Lajuan Lines, MD  Facility-Administered Medications Ordered in Other Visits:  .  alteplase (CATHFLO ACTIVASE) injection 2 mg, 2 mg, Intracatheter, Once PRN, Cross, Melissa D, NP .  heparin lock flush 100 unit/mL, 500 Units, Intravenous, Once, Cross, Melissa D, NP .  sodium chloride flush (NS) 0.9 % injection 10 mL, 10 mL, Intravenous, PRN, Cross, Melissa D, NP  BP 130/84 (BP Location: Left Arm, Patient Position: Sitting, Cuff Size: Normal)   Pulse 94   Temp 97.9 F (36.6 C) (Oral)   Ht 5\' 3"  (1.6 m)   Wt 248 lb 6 oz (112.7 kg)   SpO2 97%   BMI 44.00 kg/m  Gen- awake, alert, appears stated age Heart- RRR, no murmurs, 2+ LE edema b/l tapering at prox 1/3 of tibia Lungs- CTAB, normal effort w/o accessory muscle use MSK- no ttp over b/l calves Psych: Age appropriate judgment and insight  Lower extremity edema - Plan: ECHOCARDIOGRAM COMPLETE, Comprehensive metabolic panel, TSH  Ck labs and echo. Elevate legs, mind salt intake, counseled on exercise, compression stocking rx provided. Unlikely to be b/l DVT.  F/u prn. Pt voiced understanding and agreement to the plan.  [place], DO 12/21/20  2:43 PM

## 2020-12-21 NOTE — Patient Instructions (Signed)
For the swelling in your lower extremities, be sure to elevate your legs when able, mind the salt intake, stay physically active and consider wearing compression stockings.  Give [place] 2-3 business days to get the results of your labs back.   Someone will reach out regarding the imaging of your heart.  Let us know if you need anything.

## 2020-12-22 LAB — TSH: TSH: 4.12 u[IU]/mL (ref 0.35–4.50)

## 2020-12-23 LAB — COMPREHENSIVE METABOLIC PANEL
ALT: 20 U/L (ref 0–35)
AST: 20 U/L (ref 0–37)
Albumin: 4.2 g/dL (ref 3.5–5.2)
Alkaline Phosphatase: 134 U/L — ABNORMAL HIGH (ref 39–117)
BUN: 12 mg/dL (ref 6–23)
CO2: 26 mEq/L (ref 19–32)
Calcium: 9.7 mg/dL (ref 8.4–10.5)
Chloride: 103 mEq/L (ref 96–112)
Creatinine, Ser: 0.86 mg/dL (ref 0.40–1.20)
GFR: 69.13 mL/min (ref >60.00)
Glucose, Bld: 101 mg/dL — ABNORMAL HIGH (ref 70–99)
Potassium: 4.4 mEq/L (ref 3.5–5.1)
Sodium: 143 mEq/L (ref 135–145)
Total Bilirubin: 0.4 mg/dL (ref 0.2–1.2)
Total Protein: 7.2 g/dL (ref 6.0–8.3)

## 2020-12-26 ENCOUNTER — Ambulatory Visit: Payer: Medicare Other

## 2021-01-02 ENCOUNTER — Ambulatory Visit: Payer: Medicare Other

## 2021-01-03 DIAGNOSIS — M25551 Pain in right hip: Secondary | ICD-10-CM | POA: Diagnosis not present

## 2021-01-10 ENCOUNTER — Other Ambulatory Visit: Payer: Self-pay | Admitting: Family Medicine

## 2021-01-10 DIAGNOSIS — E1169 Type 2 diabetes mellitus with other specified complication: Secondary | ICD-10-CM

## 2021-01-10 DIAGNOSIS — E669 Obesity, unspecified: Secondary | ICD-10-CM

## 2021-01-12 ENCOUNTER — Ambulatory Visit (HOSPITAL_BASED_OUTPATIENT_CLINIC_OR_DEPARTMENT_OTHER)
Admission: RE | Admit: 2021-01-12 | Discharge: 2021-01-12 | Disposition: A | Discharge status: Home / Self Care | Payer: Medicare Other | Source: Ambulatory Visit | Attending: Family Medicine | Admitting: Family Medicine

## 2021-01-12 ENCOUNTER — Other Ambulatory Visit: Payer: Self-pay

## 2021-01-12 DIAGNOSIS — R600 Localized edema: Secondary | ICD-10-CM | POA: Diagnosis not present

## 2021-01-12 LAB — ECHOCARDIOGRAM COMPLETE
AV Mean grad: 3 mmHg
AV Peak grad: 7.2 mmHg
Ao pk vel: 1.34 m/s
Area-P 1/2: 3.06 cm2
Calc EF: 54.3 %
S' Lateral: 2.07 cm
Single Plane A2C EF: 39.7 %
Single Plane A4C EF: 65.2 %

## 2021-01-12 NOTE — Progress Notes (Signed)
*  PRELIMINARY RESULTS* Echocardiogram 2D Echocardiogram has been performed.  Luisa Hart RDCS 01/12/2021, 12:56 PM

## 2021-01-25 ENCOUNTER — Other Ambulatory Visit (HOSPITAL_BASED_OUTPATIENT_CLINIC_OR_DEPARTMENT_OTHER): Payer: Self-pay

## 2021-01-25 MED ORDER — ZOSTER VAC RECOMB ADJUVANTED 50 MCG/0.5ML IM SUSR
INTRAMUSCULAR | 1 refills | Status: DC
  Filled 2021-01-25: qty 1, 1d supply, fill #0
  Filled 2021-11-15: qty 1, 1d supply, fill #1

## 2021-02-02 DIAGNOSIS — M25551 Pain in right hip: Secondary | ICD-10-CM | POA: Diagnosis not present

## 2021-03-05 DIAGNOSIS — M25551 Pain in right hip: Secondary | ICD-10-CM | POA: Diagnosis not present

## 2021-03-07 ENCOUNTER — Other Ambulatory Visit (HOSPITAL_BASED_OUTPATIENT_CLINIC_OR_DEPARTMENT_OTHER): Payer: Self-pay

## 2021-03-07 DIAGNOSIS — I8310 Varicose veins of unspecified lower extremity with inflammation: Secondary | ICD-10-CM | POA: Diagnosis not present

## 2021-03-07 MED ORDER — BETAMETHASONE DIPROPIONATE AUG 0.05 % EX OINT
1.0000 "application " | TOPICAL_OINTMENT | Freq: Every day | CUTANEOUS | 0 refills | Status: DC | PRN
  Filled 2021-03-07: qty 100, 30d supply, fill #0

## 2021-04-05 ENCOUNTER — Encounter: Payer: Self-pay | Admitting: Family Medicine

## 2021-04-05 ENCOUNTER — Other Ambulatory Visit: Payer: Self-pay

## 2021-04-05 ENCOUNTER — Ambulatory Visit (INDEPENDENT_AMBULATORY_CARE_PROVIDER_SITE_OTHER): Payer: Medicare Other | Admitting: Family Medicine

## 2021-04-05 VITALS — BP 124/78 | HR 88 | Temp 98.2°F | Ht 63.0 in | Wt 237.5 lb

## 2021-04-05 DIAGNOSIS — I10 Essential (primary) hypertension: Secondary | ICD-10-CM | POA: Diagnosis not present

## 2021-04-05 DIAGNOSIS — J4540 Moderate persistent asthma, uncomplicated: Secondary | ICD-10-CM | POA: Diagnosis not present

## 2021-04-05 DIAGNOSIS — E1140 Type 2 diabetes mellitus with diabetic neuropathy, unspecified: Secondary | ICD-10-CM

## 2021-04-05 DIAGNOSIS — M25551 Pain in right hip: Secondary | ICD-10-CM | POA: Diagnosis not present

## 2021-04-05 LAB — LIPID PANEL
Cholesterol: 177 mg/dL (ref 0–200)
HDL: 50.9 mg/dL (ref >39.00)
LDL Cholesterol: 105 mg/dL — ABNORMAL HIGH (ref 0–99)
NonHDL: 125.68
Total CHOL/HDL Ratio: 3
Triglycerides: 101 mg/dL (ref 0.0–149.0)
VLDL: 20.2 mg/dL (ref 0.0–40.0)

## 2021-04-05 LAB — COMPREHENSIVE METABOLIC PANEL
ALT: 19 U/L (ref 0–35)
AST: 18 U/L (ref 0–37)
Albumin: 3.7 g/dL (ref 3.5–5.2)
Alkaline Phosphatase: 113 U/L (ref 39–117)
BUN: 14 mg/dL (ref 6–23)
CO2: 28 mEq/L (ref 19–32)
Calcium: 9.8 mg/dL (ref 8.4–10.5)
Chloride: 104 mEq/L (ref 96–112)
Creatinine, Ser: 0.84 mg/dL (ref 0.40–1.20)
GFR: 70.97 mL/min (ref >60.00)
Glucose, Bld: 117 mg/dL — ABNORMAL HIGH (ref 70–99)
Potassium: 4.2 mEq/L (ref 3.5–5.1)
Sodium: 141 mEq/L (ref 135–145)
Total Bilirubin: 0.4 mg/dL (ref 0.2–1.2)
Total Protein: 6.6 g/dL (ref 6.0–8.3)

## 2021-04-05 LAB — HEMOGLOBIN A1C: Hgb A1c MFr Bld: 7 % — ABNORMAL HIGH (ref 4.6–6.5)

## 2021-04-05 NOTE — Progress Notes (Signed)
Subjective:   Chief Complaint  Patient presents with   Follow-up    Katelyn Fisher is a 70 y.o. female here for follow-up of diabetes.   [name] does not routinely monitor her sugars at home.  Patient does not require insulin.   Medications include: diet controlled Diet is healthy.  Exercise: walk  Hypertension Patient presents for hypertension follow up. She does monitor home blood pressures. Blood pressures ranging on average from 120's/80's. She is compliant with medications- Norvasc 10 mg/d, Lotensin 20 mg/d, Coreg 12.5 mg bid. Patient has these side effects of medication: none Diet/exercise as above. No Cp or SOB.   Asthma Controlled on Symbicort daily. Reports compliance, no AE's. NO recent hospitalization or intubation. Uses rescue inhaler around 1x/week on average.   Past Medical History:  Diagnosis Date   Anxiety    Arthritis    back   Asthma    Depression    Endometrial cancer ([place]) 2015   Family history of breast cancer    Family history of colon cancer    Family history of ovarian cancer    GERD (gastroesophageal reflux disease)    History of colonic diverticulitis    Hx of colonic polyps    Hyperlipidemia    Hypertension    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   Osteopenia 09/2017   T score -1.2 FRAX 3.2% / 0.3%   Personal history of chemotherapy 2015   Radiation 12/17/14-01/03/15   vaginal brachytherapy   Thyroid disease 06/01/2013     Related testing: Retinal exam: Done Pneumovax: done  Objective:  BP 124/78   Pulse 88   Temp 98.2 F (36.8 C) (Oral)   Ht '5\' 3"'$  (1.6 m)   Wt 237 lb 8 oz (107.7 kg)   SpO2 98%   BMI 42.07 kg/m  General:  Well developed, well nourished, in no apparent distress Skin:  Warm, no pallor or diaphoresis Head:  Normocephalic, atraumatic Eyes:  Pupils equal and round, sclera anicteric without injection  Lungs:  CTAB, no access msc use Cardio:  RRR, no bruits, no LE edema Psych: Age appropriate judgment and  insight  Assessment:   Type 2 diabetes mellitus with diabetic neuropathy, without long-term current use of insulin (HCC) - Plan: Lipid panel, Hemoglobin A1c, Comprehensive metabolic panel  Essential hypertension  Moderate persistent asthma without complication   Plan:   Chronic, stable. Cont Lipitor 40 mg/d. Diet controlled otherwise. Counseled on diet and exercise. Chronic, stable. Cont Norvasc 10 mg/d, Coreg 12.5 mg bid, Lotensin 20 mg/d.  Chronic, stable. Cont Symbicort daily.  F/u in 6 mo for CPE or prn. The patient voiced understanding and agreement to the plan.  [place], DO 04/05/21 7:37 AM

## 2021-04-05 NOTE — Patient Instructions (Addendum)
Give [place] 2-3 business days to get the results of your labs back.   Keep the diet clean and stay active.  Strong work with your weight loss/dietary changes.  I recommend getting the flu shot in mid October. This suggestion would change if the CDC comes out with a different recommendation.   Because your blood pressure is well-controlled, you no longer have to check your blood pressure at home anymore unless you wish. Some people check it twice daily every day and some people stop altogether. Either or anything in between is fine. Strong work!  Let us know if you need anything.

## 2021-05-03 ENCOUNTER — Ambulatory Visit (INDEPENDENT_AMBULATORY_CARE_PROVIDER_SITE_OTHER): Payer: Medicare Other

## 2021-05-03 VITALS — Ht 63.0 in | Wt 234.0 lb

## 2021-05-03 DIAGNOSIS — Z0000 Encounter for general adult medical examination without abnormal findings: Secondary | ICD-10-CM

## 2021-05-03 NOTE — Patient Instructions (Addendum)
Katelyn Fisher , Thank you for taking time to complete for your Medicare Wellness Visit. I appreciate your ongoing commitment to your health goals. Please review the following plan we discussed and let me know if I can assist you in the future.   Screening recommendations/referrals: Colonoscopy: up to date, completed 10/24/16, Due 11/03/2021 Mammogram: up to date, completed 12/05/20, Due 12/05/2021 Bone Density: done 10/15/17, would like to schedule next exam for 2023 Recommended yearly ophthalmology/optometry visit for glaucoma screening and checkup Recommended yearly dental visit for hygiene and checkup  Vaccinations: Influenza vaccine: Due, may obtain in office or at local pharmacy Pneumococcal vaccine: up to date Tdap vaccine: up to date, completed 05/06/18, Due 05/16/2028 Shingles vaccine: completed first  Shingrix dose 01/25/21 , next dose due now, will obtain from pharmacy Covid-19:new booster available at local pharmacy  Advanced directives: will bring copy to next visit  Conditions/risks identified: See problem list  Next appointment: Follow up in one year for your annual wellness visit 05/07/22 @ 9:00am   Preventive Care 65 Years and Older, Female Preventive care refers to lifestyle choices and visits with your health care provider that can promote health and wellness. What does preventive care include? A yearly physical exam. This is also called an annual well check. Dental exams once or twice a year. Routine eye exams. Ask your health care provider how often you should have your eyes checked. Personal lifestyle choices, including: Daily care of your teeth and gums. Regular physical activity. Eating a healthy diet. Avoiding tobacco and drug use. Limiting alcohol use. Practicing safe sex. Taking low-dose aspirin every day. Taking vitamin and mineral supplements as recommended by your health care provider. What happens during an annual well check? The services and screenings  done by your health care provider during your annual well check will depend on your age, overall health, lifestyle risk factors, and family history of disease. Counseling  Your health care provider may ask you questions about your: Alcohol use. Tobacco use. Drug use. Emotional well-being. Home and relationship well-being. Sexual activity. Eating habits. History of falls. Memory and ability to understand (cognition). Work and work Statistician. Reproductive health. Screening  You may have the following tests or measurements: Height, weight, and BMI. Blood pressure. Lipid and cholesterol levels. These may be checked every 5 years, or more frequently if you are over 6 years old. Skin check. Lung cancer screening. You may have this screening every year starting at age 12 if you have a 30-pack-year history of smoking and currently smoke or have quit within the past 15 years. Fecal occult blood test (FOBT) of the stool. You may have this test every year starting at age 34. Flexible sigmoidoscopy or colonoscopy. You may have a sigmoidoscopy every 5 years or a colonoscopy every 10 years starting at age 40. Hepatitis C blood test. Hepatitis B blood test. Sexually transmitted disease (STD) testing. Diabetes screening. This is done by checking your blood sugar (glucose) after you have not eaten for a while (fasting). You may have this done every 1-3 years. Bone density scan. This is done to screen for osteoporosis. You may have this done starting at age 11. Mammogram. This may be done every 1-2 years. Talk to your health care provider about how often you should have regular mammograms. Talk with your health care provider about your test results, treatment options, and if necessary, the need for more tests. Vaccines  Your health care provider may recommend certain vaccines, such as: Influenza vaccine. This is recommended  every year. Tetanus, diphtheria, and acellular pertussis (Tdap, Td)  vaccine. You may need a Td booster every 10 years. Zoster vaccine. You may need this after age 66. Pneumococcal 13-valent conjugate (PCV13) vaccine. One dose is recommended after age 58. Pneumococcal polysaccharide (PPSV23) vaccine. One dose is recommended after age 32. Talk to your health care provider about which screenings and vaccines you need and how often you need them. This information is not intended to replace advice given to you by your health care provider. Make sure you discuss any questions you have with your health care provider. Document Released: 08/05/2015 Document Revised: 03/28/2016 Document Reviewed: 05/10/2015 Elsevier Interactive Patient Education  2017 [place] Prevention in the Home Falls can cause injuries. They can happen to people of all ages. There are many things you can do to make your home safe and to help prevent falls. What can I do on the outside of my home? Regularly fix the edges of walkways and driveways and fix any cracks. Remove anything that might make you trip as you walk through a door, such as a raised step or threshold. Trim any bushes or trees on the path to your home. Use bright outdoor lighting. Clear any walking paths of anything that might make someone trip, such as rocks or tools. Regularly check to see if handrails are loose or broken. Make sure that both sides of any steps have handrails. Any raised decks and porches should have guardrails on the edges. Have any leaves, snow, or ice cleared regularly. Use sand or salt on walking paths during winter. Clean up any spills in your garage right away. This includes oil or grease spills. What can I do in the bathroom? Use night lights. Install grab bars by the toilet and in the tub and shower. Do not use towel bars as grab bars. Use non-skid mats or decals in the tub or shower. If you need to sit down in the shower, use a plastic, non-slip stool. Keep the floor dry. Clean up any  water that spills on the floor as soon as it happens. Remove soap buildup in the tub or shower regularly. Attach bath mats securely with double-sided non-slip rug tape. Do not have throw rugs and other things on the floor that can make you trip. What can I do in the bedroom? Use night lights. Make sure that you have a light by your bed that is easy to reach. Do not use any sheets or blankets that are too big for your bed. They should not hang down onto the floor. Have a firm chair that has side arms. You can use this for support while you get dressed. Do not have throw rugs and other things on the floor that can make you trip. What can I do in the kitchen? Clean up any spills right away. Avoid walking on wet floors. Keep items that you use a lot in easy-to-reach places. If you need to reach something above you, use a strong step stool that has a grab bar. Keep electrical cords out of the way. Do not use floor polish or wax that makes floors slippery. If you must use wax, use non-skid floor wax. Do not have throw rugs and other things on the floor that can make you trip. What can I do with my stairs? Do not leave any items on the stairs. Make sure that there are handrails on both sides of the stairs and use them. Fix handrails that are broken  or loose. Make sure that handrails are as long as the stairways. Check any carpeting to make sure that it is firmly attached to the stairs. Fix any carpet that is loose or worn. Avoid having throw rugs at the top or bottom of the stairs. If you do have throw rugs, attach them to the floor with carpet tape. Make sure that you have a light switch at the top of the stairs and the bottom of the stairs. If you do not have them, ask someone to add them for you. What else can I do to help prevent falls? Wear shoes that: Do not have high heels. Have rubber bottoms. Are comfortable and fit you well. Are closed at the toe. Do not wear sandals. If you use a  stepladder: Make sure that it is fully opened. Do not climb a closed stepladder. Make sure that both sides of the stepladder are locked into place. Ask someone to hold it for you, if possible. Clearly mark and make sure that you can see: Any grab bars or handrails. First and last steps. Where the edge of each step is. Use tools that help you move around (mobility aids) if they are needed. These include: Canes. Walkers. Scooters. Crutches. Turn on the lights when you go into a dark area. Replace any light bulbs as soon as they burn out. Set up your furniture so you have a clear path. Avoid moving your furniture around. If any of your floors are uneven, fix them. If there are any pets around you, be aware of where they are. Review your medicines with your doctor. Some medicines can make you feel dizzy. This can increase your chance of falling. Ask your doctor what other things that you can do to help prevent falls. This information is not intended to replace advice given to you by your health care provider. Make sure you discuss any questions you have with your health care provider. Document Released: 05/05/2009 Document Revised: 12/15/2015 Document Reviewed: 08/13/2014 Elsevier Interactive Patient Education  2017 [employer].

## 2021-05-03 NOTE — Progress Notes (Addendum)
Subjective:   Katelyn Fisher is a 70 y.o. female who presents for Medicare Annual (Subsequent) preventive examination.  I connected with Chonita Mclnnis today by telephone and verified that I am speaking with the correct person using two identifiers. Location patient: home Location provider: work Persons participating in the virtual visit: patient, Marine scientist.    I discussed the limitations, risks, security and privacy concerns of performing an evaluation and management service by telephone and the availability of in person appointments. I also discussed with the patient that there may be a patient responsible charge related to this service. The patient expressed understanding and verbally consented to this telephonic visit.    Interactive audio and video telecommunications were attempted between this provider and patient, however failed, due to patient having technical difficulties OR patient did not have access to video capability.  We continued and completed visit with audio only.  Some vital signs may be absent or patient reported.   Time Spent with patient on telephone encounter: 30 minutes   Review of Systems     Cardiac Risk Factors include: advanced age (>58men, >63 women);diabetes mellitus;hypertension     Objective:    Today's Vitals   05/03/21 0900  Weight: 234 lb (106.1 kg)  Height: 5\' 3"  (1.6 m)   Body mass index is 41.45 kg/m.  Advanced Directives 05/03/2021 12/17/2019 06/04/2019 12/16/2018 09/25/2018 09/22/2018 03/20/2018  Does Patient Have a Medical Advance Directive? Yes No Yes Yes Yes Yes Yes  Type of Advance Directive [place];Living will Living will - [place];Living will  Does patient want to make changes to medical advance directive? Yes (MAU/Ambulatory/Procedural Areas - Information given) - No - Patient declined No - Patient declined No - Patient declined No - Patient declined -  Copy of  [place] in Chart? No - copy requested - - No - copy requested - - No - copy requested  Would patient like information on creating a medical advance directive? - No - Patient declined - - - - -    Current Medications (verified) Outpatient Encounter Medications as of 05/03/2021  Medication Sig   albuterol (PROAIR HFA) 108 (90 Base) MCG/ACT inhaler inhale 2  puffs by mouth every 4 hours as needed for coughing or wheezing spells (Patient taking differently: inhale 2  puffs by mouth every 4 hours as needed for coughing or wheezing spells)   albuterol (PROVENTIL) (2.5 MG/3ML) 0.083% nebulizer solution Take 3 mLs (2.5 mg total) by nebulization every 4 (four) hours as needed for wheezing or shortness of breath.   amLODipine (NORVASC) 10 MG tablet TAKE 1 TABLET BY MOUTH  DAILY   aspirin 81 MG tablet Take 81 mg by mouth daily.   atorvastatin (LIPITOR) 40 MG tablet TAKE 1 TABLET BY MOUTH  DAILY   augmented betamethasone dipropionate (DIPROLENE-AF) 0.05 % ointment Apply externally once daily as needed for 30 days.   B Complex CAPS Take by mouth.   benazepril (LOTENSIN) 20 MG tablet TAKE 1 TABLET BY MOUTH AT  BEDTIME   betamethasone valerate ointment (VALISONE) 0.1 % Apply 1 application topically as needed.    budesonide-formoterol (SYMBICORT) 160-4.5 MCG/ACT inhaler Inhale 2 puffs into the lungs as needed.    carvedilol (COREG) 12.5 MG tablet TAKE 1 TABLET BY MOUTH  TWICE DAILY WITH A MEAL   COVID-19 mRNA vaccine, Pfizer, 30 MCG/0.3ML injection INJECT AS DIRECTED   EPINEPHrine (EPIPEN 2-PAK) 0.3 mg/0.3 mL IJ SOAJ  injection Use as directed for severe allergic reaction   furosemide (LASIX) 20 MG tablet TAKE 1 TABLET BY MOUTH  DAILY   levocetirizine (XYZAL) 5 MG tablet TAKE 1 TABLET BY MOUTH IN  THE EVENING   Multiple Vitamin (MULTIVITAMIN) tablet Take 1 tablet by mouth daily.   Zoster Vaccine Adjuvanted [facility]) injection Inject into the muscle.   [DISCONTINUED] fluticasone (FLOVENT  HFA) 110 MCG/ACT inhaler Inhale 2 puffs into the lungs in the morning and at bedtime for 14 days.   Facility-Administered Encounter Medications as of 05/03/2021  Medication   0.9 %  sodium chloride infusion   alteplase (CATHFLO ACTIVASE) injection 2 mg   heparin lock flush 100 unit/mL   sodium chloride flush (NS) 0.9 % injection 10 mL    Allergies (verified) Shellfish allergy, Oxycodone, Penicillins, Iodinated diagnostic agents, and Sulfa antibiotics   History: Past Medical History:  Diagnosis Date   Anxiety    Arthritis    back   Asthma    Depression    Endometrial cancer ([place]) 2015   Family history of breast cancer    Family history of colon cancer    Family history of ovarian cancer    GERD (gastroesophageal reflux disease)    History of colonic diverticulitis    Hx of colonic polyps    Hyperlipidemia    Hypertension    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   Osteopenia 09/2017   T score -1.2 FRAX 3.2% / 0.3%   Personal history of chemotherapy 2015   Radiation 12/17/14-01/03/15   vaginal brachytherapy   Thyroid disease 06/01/2013   Past Surgical History:  Procedure Laterality Date   ABDOMINAL HYSTERECTOMY  06/23/14   [facility], TRH/BSO   CHOLECYSTECTOMY  1991   DILATION AND CURETTAGE OF UTERUS     IR REMOVAL TUN ACCESS W/ PORT W/O FL MOD SED  04/30/2017   TUBAL LIGATION     Family History  Problem Relation Age of Onset   Heart disease Maternal Aunt        x 3 -2 brothers   Hypertension Maternal Aunt    Breast cancer Maternal Aunt        maternal half; dx in her 75s   Cancer Maternal Aunt        cervical   Ovarian cancer Maternal Aunt        dx in her 67s   Prostate cancer Father 55   Diabetes Father    Cancer Father        lung cancer, asbestos exposure   Heart disease Brother    Diabetes Brother    Diabetes Mother    Lupus Mother    Heart disease Brother    Hyperlipidemia Sister    Heart disease Brother    Diabetes Brother    Colon polyps Brother    Heart  disease Brother    Diabetes Brother    Colon polyps Brother    Colon cancer Maternal Grandmother        dx in her 82s   Leukemia Maternal Uncle 15   Cancer Paternal Aunt        NOS- breast    Prostate cancer Paternal Uncle        dx in his 38s   Breast cancer Other        dx in her 76s   Social History   Socioeconomic History   Marital status: Married    Spouse name: Not on file   Number of children: 3   Years of  education: Not on file   Highest education level: Not on file  Occupational History   Occupation: retired Network engineer  Tobacco Use   Smoking status: Never   Smokeless tobacco: Never  Vaping Use   Vaping Use: Never used  Substance and Sexual Activity   Alcohol use: No    Alcohol/week: 0.0 standard drinks   Drug use: No   Sexual activity: Yes    Partners: Male    Comment: 1st intercourse- 31, partners- 106, married- 59 yrs   Other Topics Concern   Not on file  Social History Narrative   Married- 62 years   Never Smoked   Alcohol use-no   Drug use-no   Occupation: housewife   Caffeine use/day:  None   Does Patient Exercise:  yes   Social Determinants of Radio broadcast assistant Strain: Low Risk    Difficulty of Paying Living Expenses: Not hard at all  Food Insecurity: No Food Insecurity   Worried About Charity fundraiser in the Last Year: Never true   Arboriculturist in the Last Year: Never true  Transportation Needs: No Transportation Needs   Lack of Transportation (Medical): No   Lack of Transportation (Non-Medical): No  Physical Activity: Insufficiently Active   Days of Exercise per Week: 3 days   Minutes of Exercise per Session: 40 min  Stress: No Stress Concern Present   Feeling of Stress : Only a little  Social Connections: Engineer, building services of Communication with Friends and Family: More than three times a week   Frequency of Social Gatherings with Friends and Family: Once a week   Attends Religious Services: More than 4 times  per year   Active Member of [employer] or Organizations: Yes   Attends Music therapist: More than 4 times per year   Marital Status: Married    Tobacco Counseling Counseling given: Not Answered   Clinical Intake:  Pre-visit preparation completed: Yes  Pain : No/denies pain     BMI - recorded: 41.45 Nutritional Status: BMI > 30  Obese Nutritional Risks: None Diabetes: Yes CBG done?: No Did pt. bring in CBG monitor from home?: No (telephone visit)  How often do you need to have someone help you when you read instructions, pamphlets, or other written materials from your doctor or pharmacy?: 1 - Never  Diabetes:  Is the patient diabetic?  Yes  If diabetic, was a CBG obtained today?  No  Did the patient bring in their glucometer from home?  No , visit completed over the phone How often do you monitor your CBG's? never.   Financial Strains and Diabetes Management:  Are you having any financial strains with the device, your supplies or your medication? No .  Does the patient want to be seen by Chronic Care Management for management of their diabetes?  No  Would the patient like to be referred to a Nutritionist or for Diabetic Management?  No   Diabetic Exams:  Diabetic Eye Exam: Completed 11/03/20. Pt has been advised about the importance in completing this exam.   Diabetic Foot Exam:  Pt has been advised about the importance in completing this exam. To be completed by PCP  Interpreter Needed?: No  Information entered by :: Orrin Brigham LPN   Activities of Daily Living In your present state of health, do you have any difficulty performing the following activities: 05/03/2021 12/21/2020  Hearing? N N  Vision? N N  Difficulty concentrating  or making decisions? N N  Walking or climbing stairs? Y N  Comment uses cane -  Dressing or bathing? N N  Doing errands, shopping? N N  Preparing Food and eating ? N -  Using the Toilet? N -  In the past six months, have  you accidently leaked urine? N -  Do you have problems with loss of bowel control? N -  Managing your Medications? N -  Managing your Finances? N -  Housekeeping or managing your Housekeeping? Y -  Some recent data might be hidden    Patient Care Team: Shelda Pal, DO as PCP - General (Family Medicine) Suella Broad, MD as Consulting Physician (Physical Medicine and Rehabilitation) Gordy Levan, MD as Consulting Physician (Oncology) Janie Morning, MD as Attending Physician (Obstetrics and Gynecology) Eppie Gibson, MD as Attending Physician (Radiation Oncology) Rosemarie Ax, MD as Consulting Physician (Family Medicine) Princess Bruins, MD as Consulting Physician (Obstetrics and Gynecology)  Indicate any recent Medical Services you may have received from other than [facility] providers in the past year (date may be approximate).     Assessment:   This is a routine wellness examination for Sharina.  Hearing/Vision screen Hearing Screening - Comments:: No issues Vision Screening - Comments:: Wears Glasses, Last exam 11/03/20, Dr Ria Comment, patient will sched exam for 2023  Dietary issues and exercise activities discussed: Current Exercise Habits: Home exercise routine, Type of exercise: walking, Time (Minutes): 45, Frequency (Times/Week): 3, Weekly Exercise (Minutes/Week): 135, Intensity: Moderate, Exercise limited by: None identified   Goals Addressed             This Visit's Progress    Patient Stated       Maintain current goals       Depression Screen PHQ 2/9 Scores 05/03/2021 12/21/2020 12/17/2019 12/16/2018 09/09/2017 09/26/2016 03/12/2016  PHQ - 2 Score 0 0 0 0 0 0 0    Fall Risk Fall Risk  05/03/2021 12/21/2020 12/17/2019 12/16/2018 09/08/2018  Falls in the past year? 0 0 0 0 0  Number falls in past yr: 0 0 0 - 0  Injury with Fall? 0 0 0 - 0  Risk for fall due to : No Fall Risks No Fall Risks - - -  Risk for fall due to: Comment - - - - -  Follow up  Falls evaluation completed Falls evaluation completed Education provided;Falls prevention discussed - -    FALL RISK PREVENTION PERTAINING TO THE HOME:  Any stairs in or around the home? No  If so, are there any without handrails? Yes  Home free of loose throw rugs in walkways, pet beds, electrical cords, etc? Yes  Adequate lighting in your home to reduce risk of falls? Yes   ASSISTIVE DEVICES UTILIZED TO PREVENT FALLS:  Life alert? No  Use of a cane, walker or w/c? Yes  Grab bars in the bathroom? No  Shower chair or bench in shower? No  Elevated toilet seat or a handicapped toilet? No   TIMED UP AND GO:  Was the test performed? No .  Visit was completed over the phone.    Cognitive Function: Normal cognitive status assessed by this Nurse Health Advisor. No abnormalities found.   MMSE - Mini Mental State Exam 09/09/2017  Orientation to time 5  Orientation to Place 5  Registration 3  Attention/ Calculation 5  Recall 3  Language- name 2 objects 2  Language- repeat 1  Language- follow 3 step command 3  Language- read &  follow direction 1  Write a sentence 1  Copy design 1  Total score 30        Immunizations Immunization History  Administered Date(s) Administered   Fluad Quad(high Dose 65+) 04/30/2019, 05/11/2020   Influenza Split 06/11/2011, 05/21/2012   Influenza Whole 06/24/2007, 04/16/2008, 05/02/2010   Influenza, High Dose Seasonal PF 05/21/2017   Influenza,inj,Quad PF,6+ Mos 06/01/2013, 05/19/2015, 03/30/2016, 05/16/2018   Influenza-Unspecified 05/23/2014, 05/21/2017   PFIZER(Purple Top)SARS-COV-2 Vaccination 08/29/2019, 09/19/2019, 05/16/2020   Pneumococcal Conjugate-13 05/16/2018   Pneumococcal Polysaccharide-23 09/24/2016   Td 09/26/2007   Tdap 05/16/2018   Zoster Recombinat (Shingrix) 01/25/2021    TDAP status: Up to date  Flu Vaccine status: Due, Education has been provided regarding the importance of this vaccine. Advised may receive this vaccine  at local pharmacy or Health Dept. Aware to provide a copy of the vaccination record if obtained from local pharmacy or Health Dept. Verbalized acceptance and understanding.  Pneumococcal vaccine status: Up to date  Covid-19 vaccine status: Information provided on how to obtain vaccines.   Qualifies for Shingles Vaccine? Yes   Zostavax completed No   Shingrix Completed?: No.    Education has been provided regarding the importance of this vaccine. Patient has been advised to call insurance company to determine out of pocket expense if they have not yet received this vaccine. Advised may also receive vaccine at local pharmacy or Health Dept. Verbalized acceptance and understanding. Patient completed first dose of shingrix on 01/25/21.  Screening Tests Health Maintenance  Topic Date Due   COVID-19 Vaccine (4 - Booster for Pfizer series) 08/08/2020   INFLUENZA VACCINE  02/20/2021   Zoster Vaccines- Shingrix (2 of 2) 03/22/2021   FOOT EXAM  04/01/2021   HEMOGLOBIN A1C  10/03/2021   COLONOSCOPY (Pts 45-57yrs Insurance coverage will need to be confirmed)  10/24/2021   OPHTHALMOLOGY EXAM  11/03/2021   MAMMOGRAM  12/06/2022   TETANUS/TDAP  05/16/2028   DEXA SCAN  Completed   Hepatitis C Screening  Completed   HPV VACCINES  Aged Out    Health Maintenance  Health Maintenance Due  Topic Date Due   COVID-19 Vaccine (4 - Booster for [place] series) 08/08/2020   INFLUENZA VACCINE  02/20/2021   Zoster Vaccines- Shingrix (2 of 2) 03/22/2021   FOOT EXAM  04/01/2021    Colorectal cancer screening: Type of screening: Colonoscopy. Completed 10/24/16. Repeat every 5 years  Mammogram status: Completed 12/05/20. Repeat every year  Bone Density status: Completed 10/15/17. Results reflect: Bone density results: OSTEOPENIA. Repeat every 2 years.  Lung Cancer Screening: (Low Dose CT Chest recommended if Age 37-80 years, 30 pack-year currently smoking OR have quit w/in 15years.) does not qualify.      Additional Screening:  Hepatitis C Screening:  Completed 09/24/16  Vision Screening: Recommended annual ophthalmology exams for early detection of glaucoma and other disorders of the eye. Is the patient up to date with their annual eye exam?  Yes  Who is the provider or what is the name of the office in which the patient attends annual eye exams? Dr. Ria Comment   Dental Screening: Recommended annual dental exams for proper oral hygiene  Community Resource Referral / Chronic Care Management: CRR required this visit?  Yes , for assistance with house keeping  CCM required this visit?  No      Plan:     I have personally reviewed and noted the following in the patient's chart:   Medical and social history Use of alcohol, tobacco  or illicit drugs  Current medications and supplements including opioid prescriptions.  Functional ability and status Nutritional status Physical activity Advanced directives List of other physicians Hospitalizations, surgeries, and ER visits in previous 12 months Vitals Screenings to include cognitive, depression, and falls Referrals and appointments  In addition, I have reviewed and discussed with patient certain preventive protocols, quality metrics, and best practice recommendations. A written personalized care plan for preventive services as well as general preventive health recommendations were provided to patient.  Due to this being a telephonic visit, the after visit summary with patients personalized plan was offered to patient via mail or my-chart. Patient would like to access on my-chart.    Loma Messing, LPN   27/78/2423   Nurse Health Advisor  Nurse Notes: None   I have reviewed and agree with Health Coaches documentation.  Kathlene November, MD

## 2021-05-05 DIAGNOSIS — M25551 Pain in right hip: Secondary | ICD-10-CM | POA: Diagnosis not present

## 2021-05-11 ENCOUNTER — Other Ambulatory Visit (HOSPITAL_BASED_OUTPATIENT_CLINIC_OR_DEPARTMENT_OTHER): Payer: Self-pay

## 2021-05-11 MED ORDER — INFLUENZA VAC A&B SA ADJ QUAD 0.5 ML IM PRSY
PREFILLED_SYRINGE | INTRAMUSCULAR | 0 refills | Status: DC
  Filled 2021-05-11: qty 0.5, 1d supply, fill #0

## 2021-05-15 ENCOUNTER — Telehealth: Payer: Self-pay | Admitting: *Deleted

## 2021-05-15 NOTE — Telephone Encounter (Signed)
   Telephone encounter was:  Unsuccessful.  05/15/2021 Name: Katelyn Fisher MRN: [id number] DOB: June 20, 1951  Unsuccessful outbound call made today to assist with:   Cleaning   Outreach Attempt:  1st Attempt  A HIPAA compliant voice message was left requesting a return call.  Instructed patient to call back at   Instructed patient to call back at [phone]  at their earliest convenience. . [place], Care Management  [phone] [address] , [place] [address] Email : [email or web address]

## 2021-05-16 ENCOUNTER — Telehealth: Payer: Self-pay | Admitting: *Deleted

## 2021-05-16 NOTE — Telephone Encounter (Signed)
   Telephone encounter was:  Successful.  05/16/2021 Name: Katelyn Fisher MRN: [id number] DOB: 07-02-1951  Katelyn Fisher is a 70 y.o. year old female who is a primary care patient of Shelda Pal, DO . The community resource team was consulted for assistance with patient wanted resources for Housecleaning , does not want in home care just cleaning assisitance recommed checking with her church also gave connection via email for [employer] for any possible [place] benefits for her family, medicare advantage time was reminded   Care guide performed the following interventions: Patient provided with information about care guide support team and interviewed to confirm resource needs Follow up call placed to community resources to determine status of patients referral.  Follow Up Plan:  No further follow up planned at this time. The patient has been provided with needed resources. [place], Care Management  [phone] [address] , [place] [address] Email : [email or web address]

## 2021-06-01 DIAGNOSIS — M47816 Spondylosis without myelopathy or radiculopathy, lumbar region: Secondary | ICD-10-CM | POA: Diagnosis not present

## 2021-06-02 DIAGNOSIS — M25551 Pain in right hip: Secondary | ICD-10-CM | POA: Diagnosis not present

## 2021-06-07 ENCOUNTER — Encounter: Payer: Self-pay | Admitting: Family Medicine

## 2021-06-07 ENCOUNTER — Other Ambulatory Visit (INDEPENDENT_AMBULATORY_CARE_PROVIDER_SITE_OTHER): Payer: Medicare Other

## 2021-06-07 ENCOUNTER — Ambulatory Visit (INDEPENDENT_AMBULATORY_CARE_PROVIDER_SITE_OTHER): Payer: Medicare Other | Admitting: Family Medicine

## 2021-06-07 ENCOUNTER — Ambulatory Visit (HOSPITAL_BASED_OUTPATIENT_CLINIC_OR_DEPARTMENT_OTHER)
Admission: RE | Admit: 2021-06-07 | Discharge: 2021-06-07 | Disposition: A | Discharge status: Home / Self Care | Payer: Medicare Other | Source: Ambulatory Visit | Attending: Family Medicine | Admitting: Family Medicine

## 2021-06-07 ENCOUNTER — Other Ambulatory Visit: Payer: Self-pay

## 2021-06-07 VITALS — BP 128/80 | HR 84 | Temp 97.5°F | Ht 63.0 in | Wt 235.4 lb

## 2021-06-07 DIAGNOSIS — R6889 Other general symptoms and signs: Secondary | ICD-10-CM | POA: Diagnosis not present

## 2021-06-07 DIAGNOSIS — R062 Wheezing: Secondary | ICD-10-CM | POA: Diagnosis not present

## 2021-06-07 DIAGNOSIS — R6883 Chills (without fever): Secondary | ICD-10-CM | POA: Insufficient documentation

## 2021-06-07 DIAGNOSIS — R051 Acute cough: Secondary | ICD-10-CM

## 2021-06-07 DIAGNOSIS — D72818 Other decreased white blood cell count: Secondary | ICD-10-CM | POA: Diagnosis not present

## 2021-06-07 DIAGNOSIS — R0602 Shortness of breath: Secondary | ICD-10-CM | POA: Diagnosis not present

## 2021-06-07 DIAGNOSIS — R059 Cough, unspecified: Secondary | ICD-10-CM | POA: Diagnosis not present

## 2021-06-07 LAB — CBC
HCT: 42.3 % (ref 36.0–46.0)
Hemoglobin: 13.1 g/dL (ref 12.0–15.0)
MCHC: 30.9 g/dL (ref 30.0–36.0)
MCV: 65.9 fl — ABNORMAL LOW (ref 78.0–100.0)
Platelets: 201 10*3/uL (ref 150.0–400.0)
RBC: 6.41 Mil/uL — ABNORMAL HIGH (ref 3.87–5.11)
RDW: 17.7 % — ABNORMAL HIGH (ref 11.5–15.5)
WBC: 4.9 10*3/uL (ref 4.0–10.5)

## 2021-06-07 LAB — T4, FREE: Free T4: 0.87 ng/dL (ref 0.60–1.60)

## 2021-06-07 LAB — URINALYSIS WITH CULTURE, IF INDICATED
Bilirubin Urine: NEGATIVE
Hgb urine dipstick: NEGATIVE
Ketones, ur: NEGATIVE
Leukocytes,Ua: NEGATIVE
Nitrite: NEGATIVE
RBC / HPF: NONE SEEN (ref 0–?)
Specific Gravity, Urine: <=1.005 — AB (ref 1.000–1.030)
Total Protein, Urine: NEGATIVE
Urine Glucose: NEGATIVE
Urobilinogen, UA: 0.2 (ref 0.0–1.0)
WBC, UA: NONE SEEN (ref 0–?)
pH: 6.5 (ref 5.0–8.0)

## 2021-06-07 LAB — IBC + FERRITIN
Ferritin: 58.1 ng/mL (ref 10.0–291.0)
Iron: 71 ug/dL (ref 42–145)
Saturation Ratios: 17.8 % — ABNORMAL LOW (ref 20.0–50.0)
TIBC: 399 ug/dL (ref 250.0–450.0)
Transferrin: 285 mg/dL (ref 212.0–360.0)

## 2021-06-07 LAB — COMPREHENSIVE METABOLIC PANEL
ALT: 23 U/L (ref 0–35)
AST: 21 U/L (ref 0–37)
Albumin: 4.2 g/dL (ref 3.5–5.2)
Alkaline Phosphatase: 129 U/L — ABNORMAL HIGH (ref 39–117)
BUN: 12 mg/dL (ref 6–23)
CO2: 29 mEq/L (ref 19–32)
Calcium: 9.4 mg/dL (ref 8.4–10.5)
Chloride: 103 mEq/L (ref 96–112)
Creatinine, Ser: 0.81 mg/dL (ref 0.40–1.20)
GFR: 74.05 mL/min (ref >60.00)
Glucose, Bld: 114 mg/dL — ABNORMAL HIGH (ref 70–99)
Potassium: 4.2 mEq/L (ref 3.5–5.1)
Sodium: 142 mEq/L (ref 135–145)
Total Bilirubin: 0.5 mg/dL (ref 0.2–1.2)
Total Protein: 7.3 g/dL (ref 6.0–8.3)

## 2021-06-07 LAB — TSH: TSH: 4.84 u[IU]/mL (ref 0.35–5.50)

## 2021-06-07 NOTE — Progress Notes (Signed)
Chief Complaint  Patient presents with   patient gets cold and chills in the afternoons    Subjective: Patient is a 70 y.o. female here for chills.  Over past 3 weeks around 3-4 PM, she will get cold/chills. Lasts 15-20 min on its own will go away. +wheezing, urinary freq/urgency, runny nose, productive cough. No fevers, sob, chest pain, rashes, nausea/vomiting/diarrhea, sore throat, stuffy nose, ear pain.  No recent medication changes or illicit substances.  No new supplements.  No change in home temperature settings.  Sugars have been stable.  Past Medical History:  Diagnosis Date   Anxiety    Arthritis    back   Asthma    Depression    Endometrial cancer ([place]) 2015   Family history of breast cancer    Family history of colon cancer    Family history of ovarian cancer    GERD (gastroesophageal reflux disease)    History of colonic diverticulitis    Hx of colonic polyps    Hyperlipidemia    Hypertension    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   Osteopenia 09/2017   T score -1.2 FRAX 3.2% / 0.3%   Personal history of chemotherapy 2015   Radiation 12/17/14-01/03/15   vaginal brachytherapy   Thyroid disease 06/01/2013    Objective: BP 128/80   Pulse 84   Temp (!) 97.5 F (36.4 C) (Oral)   Ht 5\' 3"  (1.6 m)   Wt 235 lb 6 oz (106.8 kg)   SpO2 99%   BMI 41.69 kg/m  General: Awake, appears stated age Heart: RRR, no LE edema Lungs: CTAB, no rales, wheezes or rhonchi. No accessory muscle use Abd: BS+, S, ttp in umbilical region Psych: Age appropriate judgment and insight, normal affect and mood  Assessment and Plan: Chills - Plan: CBC, Comprehensive metabolic panel, Urinalysis with Culture, if indicated, DG Chest 2 View  Cold intolerance - Plan: TSH, T4, free  Acute cough - Plan: DG Chest 2 View  New diagnosis, uncertain prognosis.  Will want to rule out metabolic and infectious causes.  Make sure she is checking her sugars when this happens to rule out hypoglycemia.  Does  not seem to be the cause as of now, patient is diet controlled with her diabetes.  Stay hydrated. The patient voiced understanding and agreement to the plan.  [place], DO 06/07/21  9:16 AM

## 2021-06-07 NOTE — Patient Instructions (Signed)
Give Katelyn Fisher 2-3 business days to get the results of your labs back.   Keep the diet clean and stay active.  We will be in touch regarding the results of your X-ray.  Let Katelyn Fisher know if you need anything.

## 2021-06-22 ENCOUNTER — Other Ambulatory Visit (HOSPITAL_BASED_OUTPATIENT_CLINIC_OR_DEPARTMENT_OTHER): Payer: Self-pay

## 2021-06-22 MED ORDER — HYDROCODONE-ACETAMINOPHEN 5-325 MG PO TABS
1.0000 | ORAL_TABLET | Freq: Four times a day (QID) | ORAL | 0 refills | Status: DC
  Filled 2021-06-22: qty 30, 7d supply, fill #0

## 2021-06-23 ENCOUNTER — Other Ambulatory Visit (HOSPITAL_BASED_OUTPATIENT_CLINIC_OR_DEPARTMENT_OTHER): Payer: Self-pay

## 2021-07-04 ENCOUNTER — Telehealth: Payer: Self-pay | Admitting: Family Medicine

## 2021-07-04 NOTE — Telephone Encounter (Signed)
The patient started coughing on 07/03/21. Wanted to be seen by PCP or other provider in the office.offered several appointments with PCP for Friday 07/07/21. The patient declined as she stated was not a good day for her. No other openings in the office to offer. Gave her the number/address to [facility] on [address] in [place] and phone number to call them She agreed to do and appreciated the information.

## 2021-07-04 NOTE — Telephone Encounter (Signed)
Pt called in and stated she has had a cough start 12/12. She stated she is congested and is coughing up white flem. She informed me that she is do breathing treatments. We don't have any time available today or tomorrow. Pt wanting to see what to do.

## 2021-07-04 NOTE — Telephone Encounter (Signed)
Patient informed///scheduled appt.

## 2021-07-05 ENCOUNTER — Other Ambulatory Visit (HOSPITAL_BASED_OUTPATIENT_CLINIC_OR_DEPARTMENT_OTHER): Payer: Self-pay

## 2021-07-05 ENCOUNTER — Encounter: Payer: Self-pay | Admitting: Family Medicine

## 2021-07-05 ENCOUNTER — Ambulatory Visit (INDEPENDENT_AMBULATORY_CARE_PROVIDER_SITE_OTHER): Payer: Medicare Other | Admitting: Family Medicine

## 2021-07-05 ENCOUNTER — Encounter: Payer: Self-pay | Admitting: Oncology

## 2021-07-05 VITALS — BP 120/78 | HR 84 | Temp 101.6°F | Ht 63.0 in | Wt 232.1 lb

## 2021-07-05 DIAGNOSIS — R509 Fever, unspecified: Secondary | ICD-10-CM

## 2021-07-05 DIAGNOSIS — J101 Influenza due to other identified influenza virus with other respiratory manifestations: Secondary | ICD-10-CM | POA: Diagnosis not present

## 2021-07-05 LAB — POCT INFLUENZA A/B
Influenza A, POC: POSITIVE — AB
Influenza B, POC: NEGATIVE

## 2021-07-05 MED ORDER — BENZONATATE 100 MG PO CAPS
100.0000 mg | ORAL_CAPSULE | Freq: Three times a day (TID) | ORAL | 0 refills | Status: DC | PRN
Start: 2021-07-05 — End: 2021-09-28
  Filled 2021-07-05: qty 30, 10d supply, fill #0

## 2021-07-05 NOTE — Addendum Note (Signed)
Addended by: Kem Boroughs D on: 07/05/2021 12:27 PM   Modules accepted: Orders

## 2021-07-05 NOTE — Patient Instructions (Addendum)
Continue to push fluids, practice good hand hygiene, and cover your mouth if you cough. ? ?If you start having fevers, shaking or shortness of breath, seek immediate care. ? ?OK to take Tylenol 1000 mg (2 extra strength tabs) or 975 mg (3 regular strength tabs) every 6 hours as needed. ? ?Let us know if you need anything. ?

## 2021-07-05 NOTE — Progress Notes (Signed)
Chief Complaint  Patient presents with   Cough   Fever    Headache Congestion     Katelyn Fisher here for URI complaints.  Duration: 3 days  Associated symptoms: Fever (100 F), sinus congestion, rhinorrhea, itchy watery eyes, ear fullness, wheezing, and coughing Denies: sinus pain, ear pain, ear drainage, sore throat, shortness of breath, myalgia, and fevers, N/V/D, loss of taste/smell Treatment to date: SABA Sick contacts: Yes- lady at church coughing Did not ck for covid.   Past Medical History:  Diagnosis Date   Anxiety    Arthritis    back   Asthma    Depression    Endometrial cancer ([place]) 2015   Family history of breast cancer    Family history of colon cancer    Family history of ovarian cancer    GERD (gastroesophageal reflux disease)    History of colonic diverticulitis    Hx of colonic polyps    Hyperlipidemia    Hypertension    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   Osteopenia 09/2017   T score -1.2 FRAX 3.2% / 0.3%   Personal history of chemotherapy 2015   Radiation 12/17/14-01/03/15   vaginal brachytherapy   Thyroid disease 06/01/2013    Objective BP 120/78    Pulse 84    Temp (!) 101.6 F (38.7 C) (Oral)    Ht 5\' 3"  (1.6 m)    Wt 232 lb 2 oz (105.3 kg)    SpO2 97%    BMI 41.12 kg/m  General: Awake, alert, appears stated age HEENT: AT, [place], ears patent b/l and TM's neg, nares patent w/o discharge, pharynx pink and without exudates, MMM Neck: No masses or asymmetry Heart: RRR Lungs: CTAB, no accessory muscle use Psych: Age appropriate judgment and insight, normal mood and affect  Influenza A - Plan: benzonatate (TESSALON) 100 MG capsule  Orders as above. Flu test + for Flu A.  Continue to push fluids, practice good hand hygiene, cover mouth when coughing. F/u prn. If starting to experience worsening s/s's, shaking, or shortness of breath, seek immediate care. Pt voiced understanding and agreement to the plan.  [place],  DO 07/05/21 12:19 PM

## 2021-08-11 DIAGNOSIS — M47896 Other spondylosis, lumbar region: Secondary | ICD-10-CM | POA: Diagnosis not present

## 2021-08-11 DIAGNOSIS — M47816 Spondylosis without myelopathy or radiculopathy, lumbar region: Secondary | ICD-10-CM | POA: Diagnosis not present

## 2021-08-21 ENCOUNTER — Ambulatory Visit (INDEPENDENT_AMBULATORY_CARE_PROVIDER_SITE_OTHER): Payer: Medicare Other | Admitting: Family Medicine

## 2021-08-21 ENCOUNTER — Other Ambulatory Visit: Payer: Self-pay

## 2021-08-21 ENCOUNTER — Other Ambulatory Visit (HOSPITAL_BASED_OUTPATIENT_CLINIC_OR_DEPARTMENT_OTHER): Payer: Self-pay

## 2021-08-21 ENCOUNTER — Encounter: Payer: Self-pay | Admitting: Family Medicine

## 2021-08-21 ENCOUNTER — Telehealth: Payer: Self-pay | Admitting: Family Medicine

## 2021-08-21 VITALS — BP 156/100 | HR 103 | Temp 97.8°F | Resp 18 | Ht 62.0 in | Wt 230.6 lb

## 2021-08-21 DIAGNOSIS — J310 Chronic rhinitis: Secondary | ICD-10-CM

## 2021-08-21 DIAGNOSIS — J4540 Moderate persistent asthma, uncomplicated: Secondary | ICD-10-CM

## 2021-08-21 DIAGNOSIS — K219 Gastro-esophageal reflux disease without esophagitis: Secondary | ICD-10-CM | POA: Diagnosis not present

## 2021-08-21 DIAGNOSIS — T7800XD Anaphylactic reaction due to unspecified food, subsequent encounter: Secondary | ICD-10-CM

## 2021-08-21 HISTORY — DX: Chronic rhinitis: J31.0

## 2021-08-21 MED ORDER — BUDESONIDE-FORMOTEROL FUMARATE 160-4.5 MCG/ACT IN AERO
2.0000 | INHALATION_SPRAY | RESPIRATORY_TRACT | 5 refills | Status: DC | PRN
  Filled 2021-08-21: qty 10.2, 30d supply, fill #0

## 2021-08-21 MED ORDER — MONTELUKAST SODIUM 10 MG PO TABS
10.0000 mg | ORAL_TABLET | Freq: Every day | ORAL | 5 refills | Status: DC
  Filled 2021-08-21: qty 30, 30d supply, fill #0
  Filled 2022-04-05: qty 30, 30d supply, fill #1
  Filled 2022-05-10: qty 30, 30d supply, fill #2

## 2021-08-21 MED ORDER — LEVOCETIRIZINE DIHYDROCHLORIDE 5 MG PO TABS
5.0000 mg | ORAL_TABLET | Freq: Every evening | ORAL | 2 refills | Status: DC
  Filled 2021-08-21: qty 90, 90d supply, fill #0

## 2021-08-21 MED ORDER — ALBUTEROL SULFATE (2.5 MG/3ML) 0.083% IN NEBU
2.5000 mg | INHALATION_SOLUTION | RESPIRATORY_TRACT | 2 refills | Status: DC | PRN
Start: 2021-08-21 — End: 2024-02-04
  Filled 2021-08-21: qty 150, 9d supply, fill #0

## 2021-08-21 MED ORDER — ALBUTEROL SULFATE HFA 108 (90 BASE) MCG/ACT IN AERS
INHALATION_SPRAY | RESPIRATORY_TRACT | 1 refills | Status: DC
  Filled 2021-08-21: qty 8.5, 17d supply, fill #0

## 2021-08-21 NOTE — Patient Instructions (Addendum)
Asthma Begin montelukast (Singulair) 10 mg once a day to prevent cough or wheeze Increase Symbicort 160- to 2 puffs every twelve hours. Rinse mouth out after to help prevent thrush. For the next 2 weeks, add Spiriva 2.5 mcg 1 puff once a day (sample given). Continue albuterol 2 puffs every 4 hours as needed for cough or wheeze OR Instead use albuterol 0.083% solution via nebulizer one unit vial every 4 hours as needed for cough or wheeze  If no improvement in your symptoms in the next 1 to 2 days, we will move forward with chest x-ray  Allergic rhinitis Continue flonase nasal spray- using 1-2 sprays each nostril once a day as needed for stuffy nose Continue levocetirizine 5 mg one tablet once a day as needed for runny nose or itching May use saline nasal rinses as needed Consider updating your environmental allergy testing.  Remember to stop antihistamines for 3 days before your skin testing appointment.  Reflux Increase famotidine 20 mg to one tablet twice a day Continue dietary and lifestyle modifications  Food allergy Continue to avoid shellfish.  In case of an allergic reaction, give Benadryl 50 mg  every 4 hours, and if life-threatening symptoms occur, inject with EpiPen 0.3 mg. Return to the clinic if you are interested in updating your food allergy testing.  Remember to stop antihistamines for 3 days before your food testing appointment.  Call the clinic if this treatment plan is not working well for you  Schedule follow up appointment in 2 months

## 2021-08-21 NOTE — Telephone Encounter (Signed)
Patient notified of elevated blood pressure at today's visit.  She reports that she had not taken her blood pressure medications before her visit at the clinic today.  She reports that she has taken her blood pressure medications at this time and will continue to monitor her blood pressure on her home blood pressure reading machine.  She will contact her provider for further direction on blood pressure management.

## 2021-08-21 NOTE — Progress Notes (Signed)
[place] [address] Dept: [phone]  FOLLOW UP NOTE  Patient ID: Katelyn Fisher, female    DOB: 1950-12-14  Age: 71 y.o. MRN: [id number] Date of Office Visit: 08/21/2021  Assessment  Chief Complaint: Follow-up (Pt states she had flu in dec. And since she have been experiencing wheezing and coughing occasional sneezing.) and Asthma  HPI Katelyn Fisher is a 71 year old female who presents to the clinic for follow-up visit.  She was last seen in this clinic on 09/20/2020 by Dr. Ernst Bowler for evaluation of asthma, allergic rhinitis, reflux, and food allergy to shellfish.  In the interim, she had influenza on 07/05/2021 for which she took Tamiflu with relief of symptoms.  At today's visit, she reports her asthma has been not well controlled with symptoms including shortness of breath with activity, wheezing off and on over the last couple of months, and cough producing clear to yellow and back to clear mucus.  She continues Symbicort 160-2 puffs twice a day and albuterol several days of the week with relief of symptoms.  Allergic rhinitis is reported as moderately well controlled with occasional clear rhinorrhea and nasal congestion.  She is not currently using Flonase, nasal saline rinses, or taking an antihistamine.  She denies fever, sweats, chills, and sick contacts.  Reflux is reported as well controlled with no symptoms including heartburn or vomiting.  She continues famotidine 20 mg once a day.  She continues to avoid shellfish with no accidental ingestion or EpiPen use since her last visit to this clinic.  Her current medications are listed in the chart.   Drug Allergies:  Allergies  Allergen Reactions   Shellfish Allergy Hives, Shortness Of Breath and Swelling   Oxycodone Swelling    Facial swelling and tightness   Penicillins Swelling    Rash with welps   Iodinated Contrast Media Rash   Sulfa Antibiotics Rash    Physical Exam: BP (!) 156/100    Pulse (!) 103     Temp 97.8 F (36.6 C) (Temporal)    Resp 18    Ht 5\' 2"  (1.575 m)    Wt 230 lb 9.6 oz (104.6 kg)    SpO2 98%    BMI 42.18 kg/m    Physical Exam Vitals reviewed.  Constitutional:      Appearance: Normal appearance.  HENT:     Head: Normocephalic and atraumatic.     Right Ear: Tympanic membrane normal.     Left Ear: Tympanic membrane normal.     Nose:     Comments: Bilateral nares slightly erythematous with clear nasal drainage noted.  Pharynx normal.  Ears normal.  Eyes normal.    Mouth/Throat:     Pharynx: Oropharynx is clear.  Eyes:     Conjunctiva/sclera: Conjunctivae normal.  Cardiovascular:     Rate and Rhythm: Normal rate and regular rhythm.     Heart sounds: Normal heart sounds. No murmur heard. Pulmonary:     Effort: Pulmonary effort is normal.     Breath sounds: Normal breath sounds.     Comments: Lungs clear to auscultation Musculoskeletal:        General: Normal range of motion.     Cervical back: Normal range of motion and neck supple.  Skin:    General: Skin is warm and dry.  Neurological:     Mental Status: She is alert and oriented to person, place, and time.  Psychiatric:        Mood and  Affect: Mood normal.        Behavior: Behavior normal.        Thought Content: Thought content normal.        Judgment: Judgment normal.    Diagnostics: FVC 1.55, FEV1 1.30.  Predicted FVC 2.28, predicted FEV1 1.79.  Spirometry indicates mild restriction.  Postbronchodilator FVC 1.85, FEV1 1.23.  Postbronchodilator spirometry indicates normal ventilatory function.  Assessment and Plan: 1. Not well controlled moderate persistent asthma   2. Chronic rhinitis   3. Gastroesophageal reflux disease without esophagitis   4. Anaphylactic shock due to food, subsequent encounter     Meds ordered this encounter  Medications   albuterol (PROAIR HFA) 108 (90 Base) MCG/ACT inhaler    Sig: inhale 2  puffs by mouth every 4 hours as needed for coughing or wheezing spells     Dispense:  8.5 g    Refill:  1   albuterol (PROVENTIL) (2.5 MG/3ML) 0.083% nebulizer solution    Sig: Take 3 mLs (2.5 mg total) by nebulization every 4 (four) hours as needed for wheezing or shortness of breath.    Dispense:  150 mL    Refill:  2   budesonide-formoterol (SYMBICORT) 160-4.5 MCG/ACT inhaler    Sig: Inhale 2 puffs into the lungs as needed.    Dispense:  1 each    Refill:  5   levocetirizine (XYZAL) 5 MG tablet    Sig: Take 1 tablet (5 mg total) by mouth every evening.    Dispense:  90 tablet    Refill:  2   montelukast (SINGULAIR) 10 MG tablet    Sig: Take 1 tablet (10 mg total) by mouth at bedtime.    Dispense:  30 tablet    Refill:  5    Patient Instructions  Asthma Begin montelukast (Singulair) 10 mg once a day to prevent cough or wheeze Increase Symbicort 160- to 2 puffs every twelve hours. Rinse mouth out after to help prevent thrush. For the next 2 weeks, add Spiriva 2.5 mcg 1 puff once a day (sample given). Continue albuterol 2 puffs every 4 hours as needed for cough or wheeze OR Instead use albuterol 0.083% solution via nebulizer one unit vial every 4 hours as needed for cough or wheeze  If no improvement in your symptoms in the next 1 to 2 days, we will move forward with chest x-ray  Allergic rhinitis Continue flonase nasal spray- using 1-2 sprays each nostril once a day as needed for stuffy nose Continue levocetirizine 5 mg one tablet once a day as needed for runny nose or itching May use saline nasal rinses as needed Consider updating your environmental allergy testing.  Remember to stop antihistamines for 3 days before your skin testing appointment.  Reflux Increase famotidine 20 mg to one tablet twice a day Continue dietary and lifestyle modifications  Food allergy Continue to avoid shellfish.  In case of an allergic reaction, give Benadryl 50 mg  every 4 hours, and if life-threatening symptoms occur, inject with EpiPen 0.3 mg. Return to the clinic  if you are interested in updating your food allergy testing.  Remember to stop antihistamines for 3 days before your food testing appointment.  Call the clinic if this treatment plan is not working well for you  Schedule follow up appointment in 2 months  Return in about 2 months (around 10/19/2021), or if symptoms worsen or fail to improve.    Thank you for the opportunity to care for this patient.  Please do not hesitate to contact me with questions.  Gareth Morgan, FNP [facility]

## 2021-08-23 ENCOUNTER — Other Ambulatory Visit (HOSPITAL_BASED_OUTPATIENT_CLINIC_OR_DEPARTMENT_OTHER): Payer: Self-pay

## 2021-09-07 DIAGNOSIS — I8310 Varicose veins of unspecified lower extremity with inflammation: Secondary | ICD-10-CM | POA: Diagnosis not present

## 2021-09-21 ENCOUNTER — Ambulatory Visit: Payer: Medicare Other | Admitting: Obstetrics & Gynecology

## 2021-09-25 ENCOUNTER — Other Ambulatory Visit (HOSPITAL_BASED_OUTPATIENT_CLINIC_OR_DEPARTMENT_OTHER): Payer: Self-pay

## 2021-09-25 DIAGNOSIS — M5136 Other intervertebral disc degeneration, lumbar region: Secondary | ICD-10-CM | POA: Diagnosis not present

## 2021-09-25 DIAGNOSIS — M48062 Spinal stenosis, lumbar region with neurogenic claudication: Secondary | ICD-10-CM | POA: Diagnosis not present

## 2021-09-25 DIAGNOSIS — M5416 Radiculopathy, lumbar region: Secondary | ICD-10-CM | POA: Diagnosis not present

## 2021-09-25 DIAGNOSIS — M47816 Spondylosis without myelopathy or radiculopathy, lumbar region: Secondary | ICD-10-CM | POA: Diagnosis not present

## 2021-09-25 MED ORDER — TRAMADOL HCL 50 MG PO TABS
ORAL_TABLET | ORAL | 0 refills | Status: DC
  Filled 2021-09-25: qty 30, 10d supply, fill #0

## 2021-09-26 ENCOUNTER — Other Ambulatory Visit (HOSPITAL_BASED_OUTPATIENT_CLINIC_OR_DEPARTMENT_OTHER): Payer: Self-pay

## 2021-09-26 ENCOUNTER — Encounter: Payer: Self-pay | Admitting: Oncology

## 2021-09-28 ENCOUNTER — Ambulatory Visit (INDEPENDENT_AMBULATORY_CARE_PROVIDER_SITE_OTHER): Payer: Medicare Other | Admitting: Obstetrics & Gynecology

## 2021-09-28 ENCOUNTER — Encounter: Payer: Self-pay | Admitting: Obstetrics & Gynecology

## 2021-09-28 ENCOUNTER — Encounter: Payer: Self-pay | Admitting: Internal Medicine

## 2021-09-28 ENCOUNTER — Other Ambulatory Visit (HOSPITAL_COMMUNITY)
Admission: RE | Admit: 2021-09-28 | Discharge: 2021-09-28 | Disposition: A | Discharge status: Home / Self Care | Payer: Medicare Other | Source: Ambulatory Visit | Attending: Obstetrics & Gynecology | Admitting: Obstetrics & Gynecology

## 2021-09-28 ENCOUNTER — Other Ambulatory Visit: Payer: Self-pay

## 2021-09-28 VITALS — BP 114/70 | HR 74 | Resp 16 | Ht 61.25 in | Wt 233.0 lb

## 2021-09-28 DIAGNOSIS — Z90710 Acquired absence of both cervix and uterus: Secondary | ICD-10-CM

## 2021-09-28 DIAGNOSIS — Z9189 Other specified personal risk factors, not elsewhere classified: Secondary | ICD-10-CM | POA: Diagnosis not present

## 2021-09-28 DIAGNOSIS — Z01419 Encounter for gynecological examination (general) (routine) without abnormal findings: Secondary | ICD-10-CM | POA: Diagnosis present

## 2021-09-28 DIAGNOSIS — Z1272 Encounter for screening for malignant neoplasm of vagina: Secondary | ICD-10-CM | POA: Insufficient documentation

## 2021-09-28 DIAGNOSIS — M85852 Other specified disorders of bone density and structure, left thigh: Secondary | ICD-10-CM | POA: Diagnosis not present

## 2021-09-28 DIAGNOSIS — C541 Malignant neoplasm of endometrium: Secondary | ICD-10-CM | POA: Insufficient documentation

## 2021-09-28 DIAGNOSIS — Z780 Asymptomatic menopausal state: Secondary | ICD-10-CM | POA: Diagnosis not present

## 2021-09-28 DIAGNOSIS — R87610 Atypical squamous cells of undetermined significance on cytologic smear of cervix (ASC-US): Secondary | ICD-10-CM

## 2021-09-28 DIAGNOSIS — Z9079 Acquired absence of other genital organ(s): Secondary | ICD-10-CM

## 2021-09-28 DIAGNOSIS — Z90722 Acquired absence of ovaries, bilateral: Secondary | ICD-10-CM

## 2021-09-28 DIAGNOSIS — Z6841 Body Mass Index (BMI) 40.0 and over, adult: Secondary | ICD-10-CM

## 2021-09-28 NOTE — Progress Notes (Signed)
? ? ?Katelyn Fisher 1951-03-12 [id number] ? ? ?History:    71 y.o.  G2P2L3 1 adopted child.  Married. ?  ?RP:  Established patient presenting for annual gyn exam  ?  ?HPI: Postmenopause, well on no HRT.  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  Had a total hysterectomy with bilateral salpingo-oophorectomy and staging and postop chemotherapy and radiation treatment which finished in 2016.  Released by Gynecologic oncology.  Last year's Pap second ASCUS/HPV HR negative in 09/2020. Colpo 10/2020 No dysplasia on Vaginal Bx.  No pelvic pain.  Breasts normal.  Last mammogram negative 11/2020 at the [facility].  Health labs with her family physician. BMI 43.67.  Had Back surgery in 2022. BMD Osteopenia -1.2 in 09/2017.  Will schedule BD here now. COLONOSCOPY: 10-24-16. ? ?Past medical history,surgical history, family history and social history were all reviewed and documented in the EPIC chart. ? ?Gynecologic History ?No LMP recorded. Patient has had a hysterectomy. ? ?Obstetric History ?OB History  ?Gravida Para Term Preterm AB Living  ?'2 2       2  '$ ?SAB IAB Ectopic Multiple Live Births  ?           ?  ?# Outcome Date GA Lbr Len/2nd Weight Sex Delivery Anes PTL Lv  ?2 Para           ?1 Para           ?  ?Obstetric Comments  ?1 adopted child  ? ? ? ?ROS: A ROS was performed and pertinent positives and negatives are included in the history. ? GENERAL: No fevers or chills. HEENT: No change in vision, no earache, sore throat or sinus congestion. NECK: No pain or stiffness. CARDIOVASCULAR: No chest pain or pressure. No palpitations. PULMONARY: No shortness of breath, cough or wheeze. GASTROINTESTINAL: No abdominal pain, nausea, vomiting or diarrhea, melena or bright red blood per rectum. GENITOURINARY: No urinary frequency, urgency, hesitancy or dysuria. MUSCULOSKELETAL: No joint or muscle pain, no back pain, no recent trauma. DERMATOLOGIC: No rash, no itching, no lesions. ENDOCRINE: No  polyuria, polydipsia, no heat or cold intolerance. No recent change in weight. HEMATOLOGICAL: No anemia or easy bruising or bleeding. NEUROLOGIC: No headache, seizures, numbness, tingling or weakness. PSYCHIATRIC: No depression, no loss of interest in normal activity or change in sleep pattern.  ?  ? ?Exam: ? ? ?BP 114/70   Pulse 74   Resp 16   Ht 5' 1.25" (1.556 m)   Wt 233 lb (105.7 kg)   BMI 43.67 kg/m?  ? ?Body mass index is 43.67 kg/m?. ? ?General appearance : Well developed well nourished female. No acute distress ?HEENT: Eyes: no retinal hemorrhage or exudates,  Neck supple, trachea midline, no carotid bruits, no thyroidmegaly ?Lungs: Clear to auscultation, no rhonchi or wheezes, or rib retractions  ?Heart: Regular rate and rhythm, no murmurs or gallops ?Breast:Examined in sitting and supine position were symmetrical in appearance, no palpable masses or tenderness,  no skin retraction, no nipple inversion, no nipple discharge, no skin discoloration, no axillary or supraclavicular lymphadenopathy ?Abdomen: no palpable masses or tenderness, no rebound or guarding ?Extremities: no edema or skin discoloration or tenderness ? ?Pelvic: Vulva: Normal ?            Vagina: No gross lesions or discharge.  Pap reflex done.  ? Cervix/Uterus absent ? Adnexa  Without masses or tenderness ? Anus: Normal ? ? ?Assessment/Plan:  71 y.o. female for annual exam  ? ?1.  Encounter for Papanicolaou smear of vagina as part of routine gynecological examination ?Postmenopause, well on no HRT.  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  Had a total hysterectomy with bilateral salpingo-oophorectomy and staging and postop chemotherapy and radiation treatment which finished in 2016.  Released by Gynecologic oncology.  Last year's Pap second ASCUS/HPV HR negative in 09/2020. Colpo 10/2020 No dysplasia on Vaginal Bx.  No pelvic pain.  Breasts normal.  Last [facility] 11/2020 at [facility].   Health labs with her family physician. BMI 43.67.  Had Back surgery in 2022. BMD Osteopenia -1.2 in 09/2017.  Will schedule BD here now. COLONOSCOPY: 10-24-16. ?- Cytology - PAP( [place]) ? ?2. ASCUS of cervix with negative high risk HPV ?- Cytology - PAP( [place]) ? ?3. Endometrial cancer ([place]) ?- Cytology - PAP( [place]) ? ?4. S/P TAH-BSO ? ?5. Postmenopause ?Postmenopause, well on no HRT.   ?- DG Bone Density; Future ? ?6. Osteopenia of neck of left femur ?BMD Osteopenia -1.2 in 09/2017.  Will schedule BD here now. ?- DG Bone Density; Future ? ?7. Class 3 severe obesity due to excess calories with serious comorbidity and body mass index (BMI) of 40.0 to 44.9 in adult [facility]) ?Low calorie/carb diet.  Continue with fitness. ? ?8. Other specified personal risk factors, not elsewhere classified  ? ?Princess Bruins MD, 9:53 AM 09/28/2021 ? ?  ?

## 2021-09-29 LAB — CYTOLOGY - PAP: Diagnosis: NEGATIVE

## 2021-10-04 ENCOUNTER — Other Ambulatory Visit: Payer: Self-pay | Admitting: Family Medicine

## 2021-10-04 ENCOUNTER — Ambulatory Visit (INDEPENDENT_AMBULATORY_CARE_PROVIDER_SITE_OTHER): Payer: Medicare Other | Admitting: Family Medicine

## 2021-10-04 ENCOUNTER — Encounter: Payer: Self-pay | Admitting: Family Medicine

## 2021-10-04 ENCOUNTER — Other Ambulatory Visit (HOSPITAL_BASED_OUTPATIENT_CLINIC_OR_DEPARTMENT_OTHER): Payer: Self-pay

## 2021-10-04 ENCOUNTER — Other Ambulatory Visit (INDEPENDENT_AMBULATORY_CARE_PROVIDER_SITE_OTHER): Payer: Medicare Other

## 2021-10-04 VITALS — BP 120/82 | HR 81 | Temp 98.1°F | Ht 63.0 in | Wt 231.1 lb

## 2021-10-04 DIAGNOSIS — R748 Abnormal levels of other serum enzymes: Secondary | ICD-10-CM

## 2021-10-04 DIAGNOSIS — B353 Tinea pedis: Secondary | ICD-10-CM

## 2021-10-04 DIAGNOSIS — Z0000 Encounter for general adult medical examination without abnormal findings: Secondary | ICD-10-CM | POA: Diagnosis not present

## 2021-10-04 DIAGNOSIS — E669 Obesity, unspecified: Secondary | ICD-10-CM | POA: Diagnosis not present

## 2021-10-04 DIAGNOSIS — E1169 Type 2 diabetes mellitus with other specified complication: Secondary | ICD-10-CM

## 2021-10-04 DIAGNOSIS — Z23 Encounter for immunization: Secondary | ICD-10-CM | POA: Diagnosis not present

## 2021-10-04 LAB — CBC
HCT: 40.7 % (ref 36.0–46.0)
Hemoglobin: 12.4 g/dL (ref 12.0–15.0)
MCHC: 30.4 g/dL (ref 30.0–36.0)
MCV: 68.3 fl — ABNORMAL LOW (ref 78.0–100.0)
Platelets: 216 10*3/uL (ref 150.0–400.0)
RBC: 5.97 Mil/uL — ABNORMAL HIGH (ref 3.87–5.11)
RDW: 15.9 % — ABNORMAL HIGH (ref 11.5–15.5)
WBC: 6 10*3/uL (ref 4.0–10.5)

## 2021-10-04 LAB — COMPREHENSIVE METABOLIC PANEL
ALT: 25 U/L (ref 0–35)
AST: 20 U/L (ref 0–37)
Albumin: 4.1 g/dL (ref 3.5–5.2)
Alkaline Phosphatase: 120 U/L — ABNORMAL HIGH (ref 39–117)
BUN: 10 mg/dL (ref 6–23)
CO2: 31 mEq/L (ref 19–32)
Calcium: 9.6 mg/dL (ref 8.4–10.5)
Chloride: 105 mEq/L (ref 96–112)
Creatinine, Ser: 0.86 mg/dL (ref 0.40–1.20)
GFR: 68.75 mL/min (ref >60.00)
Glucose, Bld: 122 mg/dL — ABNORMAL HIGH (ref 70–99)
Potassium: 4.1 mEq/L (ref 3.5–5.1)
Sodium: 141 mEq/L (ref 135–145)
Total Bilirubin: 0.4 mg/dL (ref 0.2–1.2)
Total Protein: 6.8 g/dL (ref 6.0–8.3)

## 2021-10-04 LAB — LIPID PANEL
Cholesterol: 127 mg/dL (ref 0–200)
HDL: 52.5 mg/dL (ref >39.00)
LDL Cholesterol: 58 mg/dL (ref 0–99)
NonHDL: 74.97
Total CHOL/HDL Ratio: 2
Triglycerides: 85 mg/dL (ref 0.0–149.0)
VLDL: 17 mg/dL (ref 0.0–40.0)

## 2021-10-04 LAB — MICROALBUMIN / CREATININE URINE RATIO
Creatinine,U: 22 mg/dL
Microalb Creat Ratio: 3.2 mg/g (ref 0.0–30.0)
Microalb, Ur: <0.7 mg/dL (ref 0.0–1.9)

## 2021-10-04 LAB — HEMOGLOBIN A1C: Hgb A1c MFr Bld: 7 % — ABNORMAL HIGH (ref 4.6–6.5)

## 2021-10-04 LAB — GAMMA GT: GGT: 16 U/L (ref 7–51)

## 2021-10-04 MED ORDER — KETOCONAZOLE 2 % EX CREA
1.0000 "application " | TOPICAL_CREAM | Freq: Every day | CUTANEOUS | 0 refills | Status: AC
  Filled 2021-10-04: qty 30, 30d supply, fill #0

## 2021-10-04 NOTE — Addendum Note (Signed)
Addended by: Sharon Seller B on: 10/04/2021 08:15 AM ? ? Modules accepted: Orders ? ?

## 2021-10-04 NOTE — Progress Notes (Signed)
Chief Complaint  ?Patient presents with  ? Annual Exam  ?  ? ?Well Woman ?Katelyn Fisher is here for a complete physical.   ?Her last physical was >1 year ago.  ?Current diet: in general, a "healthy" diet. ?Current exercise: walking. ?Weight is down a few lbs and she denies daytime fatigue. ?Seatbelt? Yes ?Advanced directive? Yes ? ?Health Maintenance ?Colonoscopy- Yes ?Shingrix- Due for #2 ?DEXA- Yes ?Mammogram- Yes ?Tetanus- Yes ?Pneumonia- Due for PCV20 ?Hep C screen- Yes ? ?Past Medical History:  ?Diagnosis Date  ? Anxiety   ? Arthritis   ? back  ? Asthma   ? Depression   ? Endometrial cancer ([place]) 2015  ? Family history of breast cancer   ? Family history of colon cancer   ? Family history of ovarian cancer   ? GERD (gastroesophageal reflux disease)   ? History of colonic diverticulitis   ? Hx of colonic polyps   ? Hyperlipidemia   ? Hypertension   ? 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 Osteopenia 09/2017  ? T score -1.2 FRAX 3.2% / 0.3%  ? Personal history of chemotherapy 2015  ? Radiation 12/17/14-01/03/15  ? vaginal brachytherapy  ? Thyroid disease 06/01/2013  ?  ? ?Past Surgical History:  ?Procedure Laterality Date  ? ABDOMINAL HYSTERECTOMY  06/23/14  ? [facility], TRH/BSO  ? CHOLECYSTECTOMY  1991  ? DILATION AND CURETTAGE OF UTERUS    ? IR REMOVAL TUN ACCESS W/ PORT W/O FL MOD SED  04/30/2017  ? TUBAL LIGATION    ? ? ?Medications  ?Current Outpatient Medications on File Prior to Visit  ?Medication Sig Dispense Refill  ? albuterol (PROAIR HFA) 108 (90 Base) MCG/ACT inhaler inhale 2  puffs by mouth every 4 hours as needed for coughing or wheezing spells 8.5 g 1  ? albuterol (PROVENTIL) (2.5 MG/3ML) 0.083% nebulizer solution Take 3 mLs (2.5 mg total) by nebulization every 4 (four) hours as needed for wheezing or shortness of breath. 150 mL 2  ? amLODipine (NORVASC) 10 MG tablet TAKE 1 TABLET BY MOUTH  DAILY 90 tablet 3  ? aspirin 81 MG tablet Take 81 mg by mouth daily.    ? atorvastatin (LIPITOR) 40 MG tablet TAKE 1  TABLET BY MOUTH  DAILY 90 tablet 3  ? augmented betamethasone dipropionate (DIPROLENE-AF) 0.05 % ointment Apply externally once daily as needed for 30 days. 100 g 0  ? B Complex CAPS Take by mouth.    ? benazepril (LOTENSIN) 20 MG tablet TAKE 1 TABLET BY MOUTH AT  BEDTIME 90 tablet 3  ? betamethasone valerate ointment (VALISONE) 0.1 % Apply 1 application topically as needed.     ? budesonide-formoterol (SYMBICORT) 160-4.5 MCG/ACT inhaler Inhale 2 puffs into the lungs as needed. 10.2 g 5  ? carvedilol (COREG) 12.5 MG tablet TAKE 1 TABLET BY MOUTH  TWICE DAILY WITH A MEAL 180 tablet 3  ? EPINEPHrine (EPIPEN 2-PAK) 0.3 mg/0.3 mL IJ SOAJ injection Use as directed for severe allergic reaction 2 each 3  ? furosemide (LASIX) 20 MG tablet TAKE 1 TABLET BY MOUTH  DAILY 90 tablet 3  ? levocetirizine (XYZAL) 5 MG tablet Take 1 tablet (5 mg total) by mouth every evening. 90 tablet 2  ? montelukast (SINGULAIR) 10 MG tablet Take 1 tablet (10 mg total) by mouth at bedtime. 30 tablet 5  ? Multiple Vitamin (MULTIVITAMIN) tablet Take 1 tablet by mouth daily.    ? traMADol (ULTRAM) 50 MG tablet Take 1 tablet by mouth 3  times a day as needed. 30 tablet 0  ? Zoster Vaccine Adjuvanted [facility]) injection Inject into the muscle. 1 each 1  ? [DISCONTINUED] fluticasone (FLOVENT HFA) 110 MCG/ACT inhaler Inhale 2 puffs into the lungs in the morning and at bedtime for 14 days. 1 each 0  ? ?Allergies ?Allergies  ?Allergen Reactions  ? Shellfish Allergy Hives, Shortness Of Breath and Swelling  ? Oxycodone Swelling  ?  Facial swelling and tightness  ? Penicillins Swelling  ?  Rash with welps  ? Iodinated Contrast Media Rash  ? Sulfa Antibiotics Rash  ? ? ?Review of Systems: ?Constitutional:  no fevers ?Eye:  no recent significant change in vision ?Ears:  No changes in hearing ?Nose/Mouth/Throat:  no complaints of nasal congestion, no sore throat ?Cardiovascular: no chest pain ?Respiratory:  No shortness of breath ?Gastrointestinal:  No change in  bowel habits ?GU:  Female: negative for dysuria ?Integumentary:  no abnormal skin lesions reported ?Neurologic:  no headaches ?Endocrine:  denies unexplained weight changes ? ?Exam ?BP 120/82   Pulse 81   Temp 98.1 ?F (36.7 ?C) (Oral)   Ht '5\' 3"'$  (1.6 m)   Wt 231 lb 2 oz (104.8 kg)   SpO2 96%   BMI 40.94 kg/m?  ?General:  well developed, well nourished, in no apparent distress ?Skin: macerated tissue between 4/5 b/l feet;  no significant moles, warts, or growths ?Head:  no masses, lesions, or tenderness ?Eyes:  pupils equal and round, sclera anicteric without injection ?Ears:  canals without lesions, TMs shiny without retraction, no obvious effusion, no erythema ?Nose:  nares patent, septum midline, mucosa normal, and no drainage or sinus tenderness ?Throat/Pharynx:  lips and gingiva without lesion; tongue and uvula midline; non-inflamed pharynx; no exudates or postnasal drainage, dentures present ?Neck: neck supple without adenopathy, thyromegaly, or masses ?Lungs:  clear to auscultation, breath sounds equal bilaterally, no respiratory distress ?Cardio:  regular rate and rhythm, no bruits, 2+ pitting b/l LE edema tapering at prox 1/3 of tibia ?Abdomen:  abdomen soft, nontender; bowel sounds normal; no masses or organomegaly ?Genital: Deferred ?Neuro:  gait normal; deep tendon reflexes normal and symmetric; sensation intact to pinprick b/l feet ?Psych: well oriented with normal range of affect and appropriate judgment/insight ? ?Assessment and Plan ? ?Well adult exam ? ?Diabetes mellitus type 2 in obese ([place]) - Plan: CBC, Comprehensive metabolic panel, Microalbumin / creatinine urine ratio, Lipid panel, Hemoglobin A1c ? ?Tinea pedis of both feet - Plan: ketoconazole (NIZORAL) 2 % cream  ? ?Well 71 y.o. female. ?Counseled on diet and exercise. ?Other orders as above. ?2nd Shingrix rec'd.  ?PCV20 today.  ?6 weeks ketoconazole for feet.  ?Follow up in 6 mo. ?The patient voiced understanding and agreement to the  plan. ? ?Shelda Pal, DO ?10/04/21 ?8:02 AM ? ?

## 2021-10-04 NOTE — Patient Instructions (Addendum)
Give [place] 2-3 business days to get the results of your labs back.  ? ?Keep the diet clean and stay active. ? ?Please reach out to the pharmacy to get your second shingles shot (Shingrix). I recommend doing this after your cruise.  ? ?Let us know if you need anything. ?

## 2021-10-06 ENCOUNTER — Encounter: Payer: Medicare Other | Admitting: Family Medicine

## 2021-10-08 LAB — ALKALINE PHOSPHATASE, ISOENZYMES
Alkaline Phosphatase: 141 IU/L — ABNORMAL HIGH (ref 44–121)
BONE FRACTION: 42 % (ref 14–68)
INTESTINAL FRAC.: 4 % (ref 0–18)
LIVER FRACTION: 54 % (ref 18–85)

## 2021-10-23 ENCOUNTER — Other Ambulatory Visit (HOSPITAL_BASED_OUTPATIENT_CLINIC_OR_DEPARTMENT_OTHER): Payer: Self-pay | Admitting: Family Medicine

## 2021-10-23 DIAGNOSIS — Z1231 Encounter for screening mammogram for malignant neoplasm of breast: Secondary | ICD-10-CM

## 2021-10-25 ENCOUNTER — Other Ambulatory Visit: Payer: Self-pay | Admitting: Obstetrics & Gynecology

## 2021-10-25 ENCOUNTER — Ambulatory Visit (INDEPENDENT_AMBULATORY_CARE_PROVIDER_SITE_OTHER): Payer: Medicare Other

## 2021-10-25 DIAGNOSIS — M85852 Other specified disorders of bone density and structure, left thigh: Secondary | ICD-10-CM | POA: Diagnosis not present

## 2021-10-25 DIAGNOSIS — Z780 Asymptomatic menopausal state: Secondary | ICD-10-CM

## 2021-10-25 DIAGNOSIS — M85851 Other specified disorders of bone density and structure, right thigh: Secondary | ICD-10-CM

## 2021-10-31 ENCOUNTER — Encounter: Payer: Self-pay | Admitting: Family Medicine

## 2021-11-06 ENCOUNTER — Ambulatory Visit (AMBULATORY_SURGERY_CENTER): Payer: Medicare Other | Admitting: *Deleted

## 2021-11-06 ENCOUNTER — Other Ambulatory Visit (HOSPITAL_BASED_OUTPATIENT_CLINIC_OR_DEPARTMENT_OTHER): Payer: Self-pay

## 2021-11-06 ENCOUNTER — Other Ambulatory Visit: Payer: Self-pay

## 2021-11-06 VITALS — Ht 62.0 in | Wt 225.0 lb

## 2021-11-06 DIAGNOSIS — Z86010 Personal history of colonic polyps: Secondary | ICD-10-CM

## 2021-11-06 MED ORDER — NA SULFATE-K SULFATE-MG SULF 17.5-3.13-1.6 GM/177ML PO SOLN
1.0000 | Freq: Once | ORAL | 0 refills | Status: AC
  Filled 2021-11-06: qty 354, 1d supply, fill #0

## 2021-11-06 NOTE — Progress Notes (Signed)
No egg or soy allergy known to patient  ?No issues known to pt with past sedation with any surgeries or procedures ?Patient denies ever being told they had issues or difficulty with intubation  ?No FH of Malignant Hyperthermia ?Pt is not on diet pills ?Pt is not on  home 02  ?Pt is not on blood thinners  ?Pt denies issues with constipation  ?No A fib or A flutter ?SUPREP .Coupon to pt in PV today , Code to Pharmacy and  NO PA's for preps discussed with pt In PV today  ?Discussed with pt there will be an out-of-pocket cost for prep and that varies from $0 to 70 +  dollars - pt verbalized understanding  ?Pt instructed to use Singlecare.com or GoodRx for a price reduction on prep  ? ?PV completed over the phone. Pt verified name, DOB, address and insurance during PV today.  ?Pt mailed instruction packet with copy of consent form to read and not return, and instructions.  ?Pt encouraged to call with questions or issues.  ?If pt has My chart, procedure instructions sent via My Chart   ?

## 2021-11-15 ENCOUNTER — Other Ambulatory Visit (HOSPITAL_BASED_OUTPATIENT_CLINIC_OR_DEPARTMENT_OTHER): Payer: Self-pay

## 2021-11-16 ENCOUNTER — Encounter: Payer: Self-pay | Admitting: Oncology

## 2021-11-19 ENCOUNTER — Encounter: Payer: Self-pay | Admitting: Certified Registered Nurse Anesthetist

## 2021-11-27 ENCOUNTER — Encounter: Payer: Self-pay | Admitting: Internal Medicine

## 2021-11-27 ENCOUNTER — Ambulatory Visit (AMBULATORY_SURGERY_CENTER): Payer: Medicare Other | Admitting: Internal Medicine

## 2021-11-27 VITALS — BP 108/77 | HR 73 | Temp 97.1°F | Resp 13 | Ht 63.0 in | Wt 230.0 lb

## 2021-11-27 DIAGNOSIS — K635 Polyp of colon: Secondary | ICD-10-CM

## 2021-11-27 DIAGNOSIS — Z86010 Personal history of colonic polyps: Secondary | ICD-10-CM | POA: Diagnosis not present

## 2021-11-27 DIAGNOSIS — D123 Benign neoplasm of transverse colon: Secondary | ICD-10-CM

## 2021-11-27 MED ORDER — SODIUM CHLORIDE 0.9 % IV SOLN: 500.0000 mL | Freq: Once | INTRAVENOUS | Status: DC

## 2021-11-27 NOTE — Progress Notes (Signed)
? ?GASTROENTEROLOGY PROCEDURE H&P NOTE  ? ?Primary Care Physician: ?Shelda Pal, DO ? ? ? ?Reason for Procedure:   Hx of colon polyps  ? ?Plan:    colonoscopy ? ?Patient is appropriate for endoscopic procedure(s) in the ambulatory ([place]) setting. ? ?The nature of the procedure, as well as the risks, benefits, and alternatives were carefully and thoroughly reviewed with the patient. Ample time for discussion and questions allowed. The patient understood, was satisfied, and agreed to proceed.  ? ? ? ?HPI: ?Katelyn Fisher is a 71 y.o. female who presents for colonoscopy.  Medical history as below.  Tolerated the prep.  No recent chest pain or shortness of breath.  No abdominal pain today. ? ?Past Medical History:  ?Diagnosis Date  ? Allergy   ? SEASONAL  ? Anxiety   ? Arthritis   ? back  ? Asthma   ? Depression   ? Endometrial cancer ([place]) 2015  ? Family history of breast cancer   ? Family history of colon cancer   ? Family history of ovarian cancer   ? GERD (gastroesophageal reflux disease)   ? History of colonic diverticulitis   ? Hx of colonic polyps   ? Hyperlipidemia   ? Hypertension   ? 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 Osteopenia 09/2017  ? T score -1.2 FRAX 3.2% / 0.3%  ? Personal history of chemotherapy 2015  ? Pre-diabetes   ? Radiation 12/17/14-01/03/15  ? vaginal brachytherapy  ? Thyroid disease 06/01/2013  ? ? ?Past Surgical History:  ?Procedure Laterality Date  ? ABDOMINAL HYSTERECTOMY  06/23/2014  ? [facility], TRH/BSO  ? CHOLECYSTECTOMY  1991  ? COLONOSCOPY    ? DILATION AND CURETTAGE OF UTERUS    ? IR REMOVAL TUN ACCESS W/ PORT W/O FL MOD SED  04/30/2017  ? POLYPECTOMY    ? TUBAL LIGATION    ? ? ?Prior to Admission medications   ?Medication Sig Start Date End Date Taking? Authorizing Provider  ?amLODipine (NORVASC) 10 MG tablet TAKE 1 TABLET BY MOUTH  DAILY 01/10/21  Yes Shelda Pal, DO  ?atorvastatin (LIPITOR) 40 MG tablet TAKE 1 TABLET BY MOUTH  DAILY 01/10/21  Yes Shelda Pal, DO  ?benazepril (LOTENSIN) 20 MG tablet TAKE 1 TABLET BY MOUTH AT  BEDTIME 01/10/21  Yes Wendling, Crosby Oyster, DO  ?carvedilol (COREG) 12.5 MG tablet TAKE 1 TABLET BY MOUTH  TWICE DAILY WITH A MEAL 01/10/21  Yes Shelda Pal, DO  ?furosemide (LASIX) 20 MG tablet TAKE 1 TABLET BY MOUTH  DAILY 01/10/21  Yes Shelda Pal, DO  ?levocetirizine (XYZAL) 5 MG tablet Take 1 tablet (5 mg total) by mouth every evening. 08/21/21  Yes Ambs, Kathrine Cords, FNP  ?montelukast (SINGULAIR) 10 MG tablet Take 1 tablet (10 mg total) by mouth at bedtime. 08/21/21  Yes Ambs, Kathrine Cords, FNP  ?Multiple Vitamin (MULTIVITAMIN) tablet Take 1 tablet by mouth daily.   Yes [provider]  ?albuterol (PROAIR HFA) 108 (90 Base) MCG/ACT inhaler inhale 2  puffs by mouth every 4 hours as needed for coughing or wheezing spells 08/21/21   Ambs, Kathrine Cords, FNP  ?albuterol (PROVENTIL) (2.5 MG/3ML) 0.083% nebulizer solution Take 3 mLs (2.5 mg total) by nebulization every 4 (four) hours as needed for wheezing or shortness of breath. 08/21/21   Dara Hoyer, FNP  ?augmented betamethasone dipropionate (DIPROLENE-AF) 0.05 % ointment Apply externally once daily as needed for 30 days. 03/07/21     ?betamethasone valerate ointment (  VALISONE) 0.1 % Apply 1 application topically as needed.  10/06/19   [provider]  ?budesonide-formoterol (SYMBICORT) 160-4.5 MCG/ACT inhaler Inhale 2 puffs into the lungs as needed. 08/21/21   Dara Hoyer, FNP  ?EPINEPHrine (EPIPEN 2-PAK) 0.3 mg/0.3 mL IJ SOAJ injection Use as directed for severe allergic reaction ?Patient not taking: Reported on 11/06/2021 11/11/19   Charlies Silvers, MD  ?traMADol (ULTRAM) 50 MG tablet Take 1 tablet by mouth 3 times a day as needed. 09/25/21     ?Zoster Vaccine Adjuvanted [facility]) injection Inject into the muscle. 01/25/21   Carlyle Basques, MD  ?fluticasone (FLOVENT HFA) 110 MCG/ACT inhaler Inhale 2 puffs into the lungs in the morning and at bedtime for 14 days.  07/25/20 10/10/20  Shelda Pal, DO  ? ? ?Current Outpatient Medications  ?Medication Sig Dispense Refill  ? amLODipine (NORVASC) 10 MG tablet TAKE 1 TABLET BY MOUTH  DAILY 90 tablet 3  ? atorvastatin (LIPITOR) 40 MG tablet TAKE 1 TABLET BY MOUTH  DAILY 90 tablet 3  ? benazepril (LOTENSIN) 20 MG tablet TAKE 1 TABLET BY MOUTH AT  BEDTIME 90 tablet 3  ? carvedilol (COREG) 12.5 MG tablet TAKE 1 TABLET BY MOUTH  TWICE DAILY WITH A MEAL 180 tablet 3  ? furosemide (LASIX) 20 MG tablet TAKE 1 TABLET BY MOUTH  DAILY 90 tablet 3  ? levocetirizine (XYZAL) 5 MG tablet Take 1 tablet (5 mg total) by mouth every evening. 90 tablet 2  ? montelukast (SINGULAIR) 10 MG tablet Take 1 tablet (10 mg total) by mouth at bedtime. 30 tablet 5  ? Multiple Vitamin (MULTIVITAMIN) tablet Take 1 tablet by mouth daily.    ? albuterol (PROAIR HFA) 108 (90 Base) MCG/ACT inhaler inhale 2  puffs by mouth every 4 hours as needed for coughing or wheezing spells 8.5 g 1  ? albuterol (PROVENTIL) (2.5 MG/3ML) 0.083% nebulizer solution Take 3 mLs (2.5 mg total) by nebulization every 4 (four) hours as needed for wheezing or shortness of breath. 150 mL 2  ? augmented betamethasone dipropionate (DIPROLENE-AF) 0.05 % ointment Apply externally once daily as needed for 30 days. 100 g 0  ? betamethasone valerate ointment (VALISONE) 0.1 % Apply 1 application topically as needed.     ? budesonide-formoterol (SYMBICORT) 160-4.5 MCG/ACT inhaler Inhale 2 puffs into the lungs as needed. 10.2 g 5  ? EPINEPHrine (EPIPEN 2-PAK) 0.3 mg/0.3 mL IJ SOAJ injection Use as directed for severe allergic reaction (Patient not taking: Reported on 11/06/2021) 2 each 3  ? traMADol (ULTRAM) 50 MG tablet Take 1 tablet by mouth 3 times a day as needed. 30 tablet 0  ? Zoster Vaccine Adjuvanted [facility]) injection Inject into the muscle. 1 each 1  ? ?Current Facility-Administered Medications  ?Medication Dose Route Frequency Provider Last Rate Last Admin  ? 0.9 %  sodium chloride  infusion  500 mL Intravenous Once Seraiah Nowack, Lajuan Lines, MD      ? ? ?Allergies as of 11/27/2021 - Review Complete 11/27/2021  ?Allergen Reaction Noted  ? Shellfish allergy Hives, Shortness Of Breath, and Swelling 11/05/2011  ? Oxycodone Swelling 08/09/2014  ? Penicillins Swelling 08/28/2006  ? Iodinated contrast media Rash 06/04/2014  ? Sulfa antibiotics Rash 06/21/2014  ? ? ?Family History  ?Problem Relation Age of Onset  ? Diabetes Mother   ? Lupus Mother   ? Prostate cancer Father 70  ? Diabetes Father   ? Cancer Father   ?     lung cancer, asbestos  exposure  ? Hyperlipidemia Sister   ? Heart disease Brother   ? Diabetes Brother   ? Heart disease Brother   ? Heart disease Brother   ? Diabetes Brother   ? Colon polyps Brother   ? Heart disease Brother   ? Diabetes Brother   ? Colon polyps Brother   ? Heart disease Maternal Aunt   ?     x 3 -2 brothers  ? Hypertension Maternal Aunt   ? Breast cancer Maternal Aunt   ?     maternal half; dx in her 27s  ? Cancer Maternal Aunt   ?     cervical  ? Ovarian cancer Maternal Aunt   ?     dx in her 83s  ? Leukemia Maternal Uncle 21  ? Cancer Paternal Aunt   ?     NOS- breast   ? Prostate cancer Paternal Uncle   ?     dx in his 49s  ? Colon cancer Maternal Grandmother   ?     dx in her 27s  ? Breast cancer Other   ?     dx in her 67s  ? Esophageal cancer Neg Hx   ? Crohn's disease Neg Hx   ? Rectal cancer Neg Hx   ? Stomach cancer Neg Hx   ? ? ?Social History  ? ?Socioeconomic History  ? Marital status: Married  ?  Spouse name: Not on file  ? Number of children: 3  ? Years of education: Not on file  ? Highest education level: Not on file  ?Occupational History  ? Occupation: retired Network engineer  ?Tobacco Use  ? Smoking status: Never  ?  Passive exposure: Never  ? Smokeless tobacco: Never  ?Vaping Use  ? Vaping Use: Never used  ?Substance and Sexual Activity  ? Alcohol use: No  ?  Alcohol/week: 0.0 standard drinks  ? Drug use: No  ? Sexual activity: Yes  ?  Partners: Male  ?  Birth  control/protection: Surgical  ?  Comment: 1st intercourse- 15, partners- 94, hysterectomy  ?Other Topics Concern  ? Not on file  ?Social History Narrative  ? Married- 40 years  ? Never Smoked  ? Alcohol use-no

## 2021-11-27 NOTE — Progress Notes (Signed)
Pt's states no medical or surgical changes since previsit or office visit. 

## 2021-11-27 NOTE — Progress Notes (Signed)
Report given to PACU, vss 

## 2021-11-27 NOTE — Op Note (Signed)
[facility] ?Patient Name: Katelyn Fisher ?Procedure Date: 11/27/2021 7:21 AM ?MRN: [id number] ?Endoscopist: Jerene Bears , MD ?Age: 71 ?Referring MD:  ?Date of Birth: 05/09/1951 ?Gender: Female ?Account #: [id number] ?Procedure:                Colonoscopy ?Indications:              High risk colon cancer surveillance: Personal  ?                          history of non-advanced adenomas, Last colonoscopy:  ?                          April 2018 ?Medicines:                Monitored Anesthesia Care ?Procedure:                Pre-Anesthesia Assessment: ?                          - Prior to the procedure, a History and Physical  ?                          was performed, and patient medications and  ?                          allergies were reviewed. The patient's tolerance of  ?                          previous anesthesia was also reviewed. The risks  ?                          and benefits of the procedure and the sedation  ?                          options and risks were discussed with the patient.  ?                          All questions were answered, and informed consent  ?                          was obtained. Prior Anticoagulants: The patient has  ?                          taken no previous anticoagulant or antiplatelet  ?                          agents. ASA Grade Assessment: III - A patient with  ?                          severe systemic disease. After reviewing the risks  ?                          and benefits, the patient was deemed in  ?  satisfactory condition to undergo the procedure. ?                          After obtaining informed consent, the colonoscope  ?                          was passed under direct vision. Throughout the  ?                          procedure, the patient's blood pressure, pulse, and  ?                          oxygen saturations were monitored continuously. The  ?                          CF [id number] #[id number] was introduced through the anus   ?                          and advanced to the cecum, identified by  ?                          appendiceal orifice and ileocecal valve. The  ?                          colonoscopy was performed without difficulty. The  ?                          patient tolerated the procedure well. The quality  ?                          of the bowel preparation was excellent. The  ?                          ileocecal valve, appendiceal orifice, and rectum  ?                          were photographed. ?Scope In: 8:09:44 AM ?Scope Out: 8:23:29 AM ?Scope Withdrawal Time: 0 hours 12 minutes 6 seconds  ?Total Procedure Duration: 0 hours 13 minutes 45 seconds  ?Findings:                 The digital rectal exam was normal. ?                          A 3 mm polyp was found in the transverse colon. The  ?                          polyp was sessile. The polyp was removed with a  ?                          cold snare. Resection and retrieval were complete. ?                          The exam was otherwise normal throughout the  ?  examined colon. ?                          Anal skin tag found during retroflexion, with no  ?                          additional abnormalities were found on retroflexion. ?Complications:            No immediate complications. ?Estimated Blood Loss:     Estimated blood loss: none. ?Impression:               - One 3 mm polyp in the transverse colon, removed  ?                          with a cold snare. Resected and retrieved. ?Recommendation:           - Patient has a contact number available for  ?                          emergencies. The signs and symptoms of potential  ?                          delayed complications were discussed with the  ?                          patient. Return to normal activities tomorrow.  ?                          Written discharge instructions were provided to the  ?                          patient. ?                          - Resume previous diet. ?                           - Continue present medications. ?                          - Await pathology results. ?                          - Repeat colonoscopy is recommended for  ?                          surveillance. The colonoscopy date will be  ?                          determined after pathology results from today's  ?                          exam become available for review. ?Jerene Bears, MD ?11/27/2021 8:27:56 AM ?This report has been signed electronically. ?

## 2021-11-27 NOTE — Progress Notes (Signed)
Called to room to assist during endoscopic procedure.  Patient ID and intended procedure confirmed with present staff. Received instructions for my participation in the procedure from the performing physician.  

## 2021-11-27 NOTE — Patient Instructions (Signed)

## 2021-11-29 ENCOUNTER — Telehealth: Payer: Self-pay | Admitting: *Deleted

## 2021-11-29 NOTE — Telephone Encounter (Signed)
?  Follow up Call- ? ? ?  11/27/2021  ?  7:15 AM  ?Call back number  ?Post procedure Call Back phone  # [phone]  ?Permission to leave phone message Yes  ?  ? ?Patient questions: ? ?Do you have a fever, pain , or abdominal swelling? No. ?Pain Score  0 * ? ?Have you tolerated food without any problems? Yes.   ? ?Have you been able to return to your normal activities? Yes.   ? ?Do you have any questions about your discharge instructions: ?Diet   No. ?Medications  No. ?Follow up visit  No. ? ?Do you have questions or concerns about your Care? No. ? ?Actions: ?* If pain score is 4 or above: ?No action needed, pain <4. ? ? ?

## 2021-12-04 ENCOUNTER — Encounter: Payer: Self-pay | Admitting: Family Medicine

## 2021-12-04 ENCOUNTER — Encounter: Payer: Self-pay | Admitting: Internal Medicine

## 2021-12-05 ENCOUNTER — Ambulatory Visit (INDEPENDENT_AMBULATORY_CARE_PROVIDER_SITE_OTHER): Payer: Medicare Other | Admitting: Family Medicine

## 2021-12-05 ENCOUNTER — Encounter: Payer: Self-pay | Admitting: Family Medicine

## 2021-12-05 VITALS — BP 138/80 | HR 58 | Temp 97.9°F | Ht 62.0 in | Wt 235.4 lb

## 2021-12-05 DIAGNOSIS — S76112A Strain of left quadriceps muscle, fascia and tendon, initial encounter: Secondary | ICD-10-CM

## 2021-12-05 NOTE — Patient Instructions (Signed)
Heat (pad or rice pillow in microwave) over affected area, 10-15 minutes twice daily.  ? ?Ice/cold pack over area for 10-15 min twice daily. ? ?OK to take Tylenol 1000 mg (2 extra strength tabs) or 975 mg (3 regular strength tabs) every 6 hours as needed. ? ?Let us know if you need anything. ? ?Quadriceps Strain Rehab ?It is normal to feel mild stretching, pulling, tightness, or discomfort as you do these exercises, but you should stop right away if you feel sudden pain or your pain gets worse. ?Stretching and range of motion exercises ?These exercises warm up your muscles and joints and improve the movement and flexibility of your thigh. These exercises can also help to relieve stiffness or swelling. ?Exercise A: Heel slides ? ?  ?Lie on your back with both knees straight. If this causes back discomfort, bend the knee of your healthy leg, placing your foot flat on the floor. ?Slowly slide your left / right heel back toward your buttocks until you feel a gentle stretch in the front of your knee or thigh. ?Hold for 30 seconds. Then slowly slide your heel back to the starting position. ?Repeat 2 times. Complete this exercise 3 times a week. ?Exercise B: Quadriceps stretch, prone ? ?  ?Lie on your abdomen on a firm surface, such as a bed or padded floor. ?Bend your left / right knee and hold your ankle. If you cannot reach your ankle or pant leg, loop a belt around your foot and grab the belt instead. ?Gently pull your heel toward your buttocks. Your knee should not slide out to the side. You should feel a stretch in the front of your thigh and knee. ?Hold this position for 30 seconds. ?Repeat 2 times. Complete this exercise 3 times a week. ?Strengthening exercises ?These exercises build strength and endurance in your thigh. Endurance is the ability to use your muscles for a long time, even after your muscles get tired. ?Exercise C: Straight leg raises (quadriceps and hip flexors) ?Quality counts! Watch for signs that  the quadriceps muscle is working to ensure that you are strengthening the correct muscles and not cheating by using healthier muscles. ?Lie on your back with your left / right leg extended and your other knee bent. ?Tense the muscles in the front of your left / right thigh. You should see your kneecap slide up or see increased dimpling just above the knee. ?Tighten these muscles even more and raise your leg 4-6 inches (10-15 cm) off the floor. ?Hold for 3 seconds. ?Keep the thigh muscles tense as you lower your leg. ?Relax the muscles slowly and completely after each repetition. ?Repeat 2 times. Complete this exercise 3 times a week. ?Exercise D: Straight leg raises (hip extensors) ?Lie on your belly on a bed or a firm surface with a pillow under your hips. ?Bend your left / right knee so your foot is straight up in the air. ?Tense your buttock muscles and lift your left / right thigh off the bed. Do not let your back arch. ?Hold this position for 3 seconds. ?Slowly return to the starting position. Let your muscles relax completely before doing another repetition. ?Repeat 2 times. Complete this exercise 3 times a week. ?Exercise E: Wall sits ? ?  ?Follow the directions for form closely. If you do not place your feet and knees properly, this can lead to knee pain. ?Lean back against a smooth wall or door and walk your feet out 18-24 inches (46-61 cm)  from it. Place your feet hip-width apart. ?Slowly slide down the wall or door until your knees bend  60-90 degrees. Keep your weight back and over your heels, not over your toes. Keep your thighs straight or pointing slightly outward. ?Hold for 1 second. ?Use your thigh and buttock muscles to push you back up to a standing position. Keep your weight through your heels while you do this. ?Rest for 5 seconds in between repetitions. ?Repeat 2 times. Complete this exercise 3 times a week. ?Make sure you discuss any questions you have with your health care provider. ?Document  Released: 07/09/2005 Document Revised: 03/15/2016 Document Reviewed: 04/12/2015 ?Elsevier Interactive Patient Education ? 2018 [place]. ?  ?

## 2021-12-05 NOTE — Progress Notes (Signed)
Musculoskeletal Exam ? ?Patient: Katelyn Fisher DOB: 08-30-1950 ? ?DOS: 12/05/2021 ? ?SUBJECTIVE: ? ?Chief Complaint:  ? ?Chief Complaint  ?Patient presents with  ? Leg Pain  ?  Left leg ?  ? ? ?ARLINDA BARCELONA is a 71 y.o.  female for evaluation and treatment of L thigh pain.  ? ?Onset:  2 weeks ago. No inj or change in activity.  ?Location: L quad ?Character:  aching, sharp, and spasms   ?Progression of issue:  has worsened ?Associated symptoms: hurts when walking, particularly lifting her leg ?No bruising, redness, swelling, new back pain ?Treatment: to date has been Stop the Pain topical.   ?Neurovascular symptoms: no ? ?Past Medical History:  ?Diagnosis Date  ? Allergy   ? SEASONAL  ? Anxiety   ? Arthritis   ? back  ? Asthma   ? Depression   ? Endometrial cancer ([place]) 2015  ? Family history of breast cancer   ? Family history of colon cancer   ? Family history of ovarian cancer   ? GERD (gastroesophageal reflux disease)   ? History of colonic diverticulitis   ? Hx of colonic polyps   ? Hyperlipidemia   ? Hypertension   ? 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 Osteopenia 09/2017  ? T score -1.2 FRAX 3.2% / 0.3%  ? Personal history of chemotherapy 2015  ? Pre-diabetes   ? Radiation 12/17/14-01/03/15  ? vaginal brachytherapy  ? Thyroid disease 06/01/2013  ? ? ?Objective: ?VITAL SIGNS: BP 138/80   Pulse (!) 58   Temp 97.9 ?F (36.6 ?C) (Oral)   Ht '5\' 2"'$  (1.575 m)   Wt 235 lb 6 oz (106.8 kg)   SpO2 99%   BMI 43.05 kg/m?  ?Constitutional: Well formed, well developed. No acute distress. ?Thorax & Lungs: No accessory muscle use ?Musculoskeletal: L thigh.   ?Normal active range of motion: yes.   ?Normal passive range of motion: yes ?Tenderness to palpation: yes over anterior quad ?Deformity: no ?Ecchymosis: no ?Tests positive:  [place] ?Tests negative: log roll, FABER, FADDIR ?Neurologic: Normal sensory function. No focal deficits noted. DTR's equal and symmetric in LE's. No clonus. ?Psychiatric: Normal mood. Age  appropriate judgment and insight. Alert & oriented x 3.   ? ?Assessment: ? ?Strain of left quadriceps, initial encounter ? ?Plan: ?Stretches/exercises, heat, ice, Tylenol. PT if no improvement.  ?F/u as originally scheduled. ?The patient voiced understanding and agreement to the plan. ? ? ?[place], DO ?12/05/21  ?11:53 AM ? ?

## 2021-12-11 ENCOUNTER — Encounter (HOSPITAL_BASED_OUTPATIENT_CLINIC_OR_DEPARTMENT_OTHER): Payer: Self-pay

## 2021-12-11 ENCOUNTER — Ambulatory Visit (HOSPITAL_BASED_OUTPATIENT_CLINIC_OR_DEPARTMENT_OTHER)
Admission: RE | Admit: 2021-12-11 | Discharge: 2021-12-11 | Disposition: A | Discharge status: Home / Self Care | Payer: Medicare Other | Source: Ambulatory Visit | Attending: Family Medicine | Admitting: Family Medicine

## 2021-12-11 DIAGNOSIS — Z1231 Encounter for screening mammogram for malignant neoplasm of breast: Secondary | ICD-10-CM | POA: Insufficient documentation

## 2021-12-13 ENCOUNTER — Encounter: Payer: Self-pay | Admitting: Family Medicine

## 2021-12-13 LAB — HM DIABETES EYE EXAM

## 2021-12-23 ENCOUNTER — Other Ambulatory Visit: Payer: Self-pay

## 2021-12-23 ENCOUNTER — Encounter (HOSPITAL_BASED_OUTPATIENT_CLINIC_OR_DEPARTMENT_OTHER): Payer: Self-pay | Admitting: Emergency Medicine

## 2021-12-23 ENCOUNTER — Emergency Department (HOSPITAL_BASED_OUTPATIENT_CLINIC_OR_DEPARTMENT_OTHER): Payer: Medicare Other

## 2021-12-23 ENCOUNTER — Emergency Department (HOSPITAL_BASED_OUTPATIENT_CLINIC_OR_DEPARTMENT_OTHER)
Admission: EM | Admit: 2021-12-23 | Discharge: 2021-12-24 | Disposition: A | Discharge status: Home / Self Care | Payer: Medicare Other | Attending: Emergency Medicine | Admitting: Emergency Medicine

## 2021-12-23 DIAGNOSIS — N179 Acute kidney failure, unspecified: Secondary | ICD-10-CM | POA: Diagnosis not present

## 2021-12-23 DIAGNOSIS — Z79899 Other long term (current) drug therapy: Secondary | ICD-10-CM | POA: Diagnosis not present

## 2021-12-23 DIAGNOSIS — R4020 Unspecified coma: Secondary | ICD-10-CM | POA: Diagnosis not present

## 2021-12-23 DIAGNOSIS — R531 Weakness: Secondary | ICD-10-CM | POA: Diagnosis not present

## 2021-12-23 DIAGNOSIS — Z743 Need for continuous supervision: Secondary | ICD-10-CM | POA: Diagnosis not present

## 2021-12-23 DIAGNOSIS — R404 Transient alteration of awareness: Secondary | ICD-10-CM | POA: Diagnosis not present

## 2021-12-23 DIAGNOSIS — R55 Syncope and collapse: Secondary | ICD-10-CM | POA: Diagnosis not present

## 2021-12-23 DIAGNOSIS — E860 Dehydration: Secondary | ICD-10-CM | POA: Insufficient documentation

## 2021-12-23 DIAGNOSIS — I499 Cardiac arrhythmia, unspecified: Secondary | ICD-10-CM | POA: Diagnosis not present

## 2021-12-23 DIAGNOSIS — T675XXA Heat exhaustion, unspecified, initial encounter: Secondary | ICD-10-CM | POA: Diagnosis not present

## 2021-12-23 LAB — COMPREHENSIVE METABOLIC PANEL
ALT: 22 U/L (ref 0–44)
AST: 25 U/L (ref 15–41)
Albumin: 4 g/dL (ref 3.5–5.0)
Alkaline Phosphatase: 128 U/L — ABNORMAL HIGH (ref 38–126)
Anion gap: 10 (ref 5–15)
BUN: 15 mg/dL (ref 8–23)
CO2: 24 mmol/L (ref 22–32)
Calcium: 9.5 mg/dL (ref 8.9–10.3)
Chloride: 108 mmol/L (ref 98–111)
Creatinine, Ser: 1.49 mg/dL — ABNORMAL HIGH (ref 0.44–1.00)
GFR, Estimated: 38 mL/min — ABNORMAL LOW (ref >60)
Glucose, Bld: 137 mg/dL — ABNORMAL HIGH (ref 70–99)
Potassium: 3.5 mmol/L (ref 3.5–5.1)
Sodium: 142 mmol/L (ref 135–145)
Total Bilirubin: 0.5 mg/dL (ref 0.3–1.2)
Total Protein: 8 g/dL (ref 6.5–8.1)

## 2021-12-23 LAB — CBC
HCT: 41.9 % (ref 36.0–46.0)
Hemoglobin: 12.8 g/dL (ref 12.0–15.0)
MCH: 21.2 pg — ABNORMAL LOW (ref 26.0–34.0)
MCHC: 30.5 g/dL (ref 30.0–36.0)
MCV: 69.4 fL — ABNORMAL LOW (ref 80.0–100.0)
Platelets: 216 10*3/uL (ref 150–400)
RBC: 6.04 MIL/uL — ABNORMAL HIGH (ref 3.87–5.11)
RDW: 16.4 % — ABNORMAL HIGH (ref 11.5–15.5)
WBC: 9 10*3/uL (ref 4.0–10.5)
nRBC: 0 % (ref 0.0–0.2)

## 2021-12-23 LAB — TROPONIN I (HIGH SENSITIVITY): Troponin I (High Sensitivity): 7 ng/L (ref <18)

## 2021-12-23 MED ORDER — ONDANSETRON HCL 4 MG/2ML IJ SOLN
4.0000 mg | Freq: Once | INTRAMUSCULAR | Status: AC
  Administered 2021-12-23: 4 mg via INTRAVENOUS
  Filled 2021-12-23: qty 2

## 2021-12-23 MED ORDER — SODIUM CHLORIDE 0.9 % IV BOLUS
500.0000 mL | Freq: Once | INTRAVENOUS | Status: AC
  Administered 2021-12-23: 500 mL via INTRAVENOUS

## 2021-12-23 NOTE — ED Notes (Signed)
Pt given water, ginger ale and graham crackers

## 2021-12-23 NOTE — ED Notes (Signed)
Pt's O2 decreased to 86% on RA. Pt initially placed on 2lpm [place], O2 increased to 90%. Increased 3lpm [place], O2 99% now.

## 2021-12-23 NOTE — Discharge Instructions (Addendum)
It was our pleasure to provide your ER care today - we hope that you feel better.  From today's labs, your kidney function test is mild high (creatinine 1.49) - Drink plenty of fluids/stay well hydrated.  Hold/do not take your lasix for the next two days, and follow up with your doctor Monday for recheck (also have them recheck your labs).   Return to ER if worse, new symptoms, fevers, new/severe pain, chest pain, trouble breathing, severe headache, abdominal pain, persistent vomiting, recurrent fainting, or other concern.

## 2021-12-23 NOTE — ED Provider Notes (Addendum)
[facility] DEPARTMENT Provider Note   CSN: [id number] Arrival date & time: 12/23/21  2042     History  Chief Complaint  Patient presents with   Loss of Consciousness    Katelyn Fisher is a 71 y.o. female.  EMS had been called to pick up patient with syncope. Patient indicates was at a birthday party for her - states felt fine earlier, then noted feeling hot/hot flash, nausea - syncopal event.  Patient now c/o persistent nausea and vomiting. Emesis not bloody or bilious. No abd pain or distension. Has been having normal bms. No known ill contacts or bad food ingestion. Denies chest pain or discomfort. No palpitations or sense of fast or irregular heart beating. No sob or unusual doe. No cough or uri symptoms. No fever or chills. No dysuria or gu c/o. On extremity swelling/pain. No rectal bleeding or melena. No recent new meds. Denies faintness currently. No severe headaches. No numbness/weakness. No change in speech or vision.   The history is provided by the patient, the EMS personnel and medical records.  Loss of Consciousness Associated symptoms: nausea and vomiting   Associated symptoms: no chest pain, no confusion, no fever, no palpitations, no shortness of breath and no weakness       Home Medications Prior to Admission medications   Medication Sig Start Date End Date Taking? Authorizing Provider  albuterol (PROAIR HFA) 108 (90 Base) MCG/ACT inhaler inhale 2  puffs by mouth every 4 hours as needed for coughing or wheezing spells 08/21/21   Ambs, Kathrine Cords, FNP  albuterol (PROVENTIL) (2.5 MG/3ML) 0.083% nebulizer solution Take 3 mLs (2.5 mg total) by nebulization every 4 (four) hours as needed for wheezing or shortness of breath. 08/21/21   Dara Hoyer, FNP  amLODipine (NORVASC) 10 MG tablet TAKE 1 TABLET BY MOUTH  DAILY 01/10/21   Shelda Pal, DO  atorvastatin (LIPITOR) 40 MG tablet TAKE 1 TABLET BY MOUTH  DAILY 01/10/21   Shelda Pal, DO   augmented betamethasone dipropionate (DIPROLENE-AF) 0.05 % ointment Apply externally once daily as needed for 30 days. 03/07/21     benazepril (LOTENSIN) 20 MG tablet TAKE 1 TABLET BY MOUTH AT  BEDTIME 01/10/21   Wendling, Crosby Oyster, DO  betamethasone valerate ointment (VALISONE) 0.1 % Apply 1 application topically as needed.  10/06/19   [provider]  budesonide-formoterol (SYMBICORT) 160-4.5 MCG/ACT inhaler Inhale 2 puffs into the lungs as needed. 08/21/21   Dara Hoyer, FNP  carvedilol (COREG) 12.5 MG tablet TAKE 1 TABLET BY MOUTH  TWICE DAILY WITH A MEAL 01/10/21   Wendling, Crosby Oyster, DO  EPINEPHrine (EPIPEN 2-PAK) 0.3 mg/0.3 mL IJ SOAJ injection Use as directed for severe allergic reaction 11/11/19   Charlies Silvers, MD  furosemide (LASIX) 20 MG tablet TAKE 1 TABLET BY MOUTH  DAILY 01/10/21   Nani Ravens, Crosby Oyster, DO  levocetirizine (XYZAL) 5 MG tablet Take 1 tablet (5 mg total) by mouth every evening. 08/21/21   Dara Hoyer, FNP  montelukast (SINGULAIR) 10 MG tablet Take 1 tablet (10 mg total) by mouth at bedtime. 08/21/21   Dara Hoyer, FNP  Multiple Vitamin (MULTIVITAMIN) tablet Take 1 tablet by mouth daily.    [provider]  traMADol (ULTRAM) 50 MG tablet Take 1 tablet by mouth 3 times a day as needed. 09/25/21     Zoster Vaccine Adjuvanted [facility]) injection Inject into the muscle. 01/25/21   Carlyle Basques, MD  fluticasone ([place]  HFA) 110 MCG/ACT inhaler Inhale 2 puffs into the lungs in the morning and at bedtime for 14 days. 07/25/20 10/10/20  Shelda Pal, DO      Allergies    Shellfish allergy, Oxycodone, Penicillins, Iodinated contrast media, and Sulfa antibiotics    Review of Systems   Review of Systems  Constitutional:  Negative for chills and fever.  HENT:  Negative for sore throat.   Eyes:  Negative for visual disturbance.  Respiratory:  Negative for cough and shortness of breath.   Cardiovascular:  Positive for syncope. Negative for  chest pain, palpitations and leg swelling.  Gastrointestinal:  Positive for nausea and vomiting. Negative for abdominal pain and diarrhea.  Genitourinary:  Negative for dysuria and flank pain.  Musculoskeletal:  Negative for back pain and neck pain.  Skin:  Negative for rash.  Neurological:  Negative for speech difficulty, weakness and numbness.  Hematological:  Does not bruise/bleed easily.  Psychiatric/Behavioral:  Negative for confusion.    Physical Exam Updated Vital Signs BP 121/79   Pulse 82   Temp 98.3 F (36.8 C)   Resp 13   Ht 1.575 m ('5\' 2"'$ )   Wt 105.2 kg   SpO2 95%   BMI 42.43 kg/m  Physical Exam Vitals and nursing note reviewed.  Constitutional:      Appearance: Normal appearance. She is well-developed.  HENT:     Head: Atraumatic.     Nose: Nose normal.     Mouth/Throat:     Mouth: Mucous membranes are moist.  Eyes:     General: No scleral icterus.    Conjunctiva/sclera: Conjunctivae normal.     Pupils: Pupils are equal, round, and reactive to light.  Neck:     Vascular: No carotid bruit.     Trachea: No tracheal deviation.  Cardiovascular:     Rate and Rhythm: Normal rate and regular rhythm.     Pulses: Normal pulses.     Heart sounds: Normal heart sounds. No murmur heard.   No friction rub. No gallop.  Pulmonary:     Effort: Pulmonary effort is normal. No respiratory distress.     Breath sounds: Normal breath sounds.  Abdominal:     General: Bowel sounds are normal. There is no distension.     Palpations: Abdomen is soft. There is no mass.     Tenderness: There is no abdominal tenderness. There is no guarding or rebound.     Hernia: No hernia is present.  Genitourinary:    Comments: No cva tenderness.  Musculoskeletal:        General: No swelling or tenderness.     Cervical back: Normal range of motion and neck supple. No rigidity or tenderness. No muscular tenderness.     Right lower leg: No edema.     Left lower leg: No edema.  Skin:     General: Skin is warm and dry.     Findings: No rash.  Neurological:     Mental Status: She is alert.     Comments: Alert, speech normal, no dysarthria or aphasia. Motor/sens grossly intact bil.   Psychiatric:        Mood and Affect: Mood normal.    ED Results / Procedures / Treatments   Labs (all labs ordered are listed, but only abnormal results are displayed) Results for orders placed or performed during the hospital encounter of 12/23/21  CBC  Result Value Ref Range   WBC 9.0 4.0 - 10.5 K/uL   RBC  6.04 (H) 3.87 - 5.11 MIL/uL   Hemoglobin 12.8 12.0 - 15.0 g/dL   HCT 41.9 36.0 - 46.0 %   MCV 69.4 (L) 80.0 - 100.0 fL   MCH 21.2 (L) 26.0 - 34.0 pg   MCHC 30.5 30.0 - 36.0 g/dL   RDW 16.4 (H) 11.5 - 15.5 %   Platelets 216 150 - 400 K/uL   nRBC 0.0 0.0 - 0.2 %  Comprehensive metabolic panel  Result Value Ref Range   Sodium 142 135 - 145 mmol/L   Potassium 3.5 3.5 - 5.1 mmol/L   Chloride 108 98 - 111 mmol/L   CO2 24 22 - 32 mmol/L   Glucose, Bld 137 (H) 70 - 99 mg/dL   BUN 15 8 - 23 mg/dL   Creatinine, Ser 1.49 (H) 0.44 - 1.00 mg/dL   Calcium 9.5 8.9 - 10.3 mg/dL   Total Protein 8.0 6.5 - 8.1 g/dL   Albumin 4.0 3.5 - 5.0 g/dL   AST 25 15 - 41 U/L   ALT 22 0 - 44 U/L   Alkaline Phosphatase 128 (H) 38 - 126 U/L   Total Bilirubin 0.5 0.3 - 1.2 mg/dL   GFR, Estimated 38 (L) >60 mL/min   Anion gap 10 5 - 15  Troponin I (High Sensitivity)  Result Value Ref Range   Troponin I (High Sensitivity) 7 <18 ng/L   *Note: Due to a large number of results and/or encounters for the requested time period, some results have not been displayed. A complete set of results can be found in Results Review.   MM 3D SCREEN BREAST BILATERAL  Result Date: 12/12/2021 CLINICAL DATA:  Screening. EXAM: DIGITAL SCREENING BILATERAL MAMMOGRAM WITH TOMOSYNTHESIS AND CAD TECHNIQUE: Bilateral screening digital craniocaudal and mediolateral oblique mammograms were obtained. Bilateral screening digital breast  tomosynthesis was performed. The images were evaluated with computer-aided detection. COMPARISON:  Previous exam(s). ACR Breast Density Category a: The breast tissue is almost entirely fatty. FINDINGS: There are no findings suspicious for malignancy. IMPRESSION: No mammographic evidence of malignancy. A result letter of this screening mammogram will be mailed directly to the patient. RECOMMENDATION: Screening mammogram in one year. (Code:SM-B-01Y) BI-RADS CATEGORY  1: Negative. Electronically Signed   By: Kristopher Oppenheim M.D.   On: 12/12/2021 08:38      EKG EKG Interpretation  Date/Time:  Saturday December 23 2021 21:19:22 EDT Ventricular Rate:  89 PR Interval:  146 QRS Duration: 107 QT Interval:  358 QTC Calculation: 436 R Axis:   -7 Text Interpretation: Sinus rhythm Confirmed by Lajean Saver [phone]) on 12/23/2021 9:36:09 PM  Radiology No results found.  Procedures Procedures    Medications Ordered in ED Medications  sodium chloride 0.9 % bolus 500 mL (0 mLs Intravenous Stopped 12/23/21 2255)  ondansetron (ZOFRAN) injection 4 mg (4 mg Intravenous Given 12/23/21 2125)  sodium chloride 0.9 % bolus 500 mL (500 mLs Intravenous New Bag/Given 12/23/21 2255)    ED Course/ Medical Decision Making/ A&P                           Medical Decision Making Problems Addressed: AKI (acute kidney injury) [facility]): acute illness or injury Dehydration: acute illness or injury with systemic symptoms that poses a threat to life or bodily functions Syncope, unspecified syncope type: acute illness or injury with systemic symptoms that poses a threat to life or bodily functions Vasovagal episode: acute illness or injury with systemic symptoms that poses a threat  to life or bodily functions  Amount and/or Complexity of Data Reviewed Independent Historian:     Details: family, hx External Data Reviewed: notes. Labs: ordered. Decision-making details documented in ED Course. Radiology: ordered and independent  interpretation performed. Decision-making details documented in ED Course. ECG/medicine tests: ordered and independent interpretation performed. Decision-making details documented in ED Course.  Risk Prescription drug management. Decision regarding hospitalization.   Iv ns. Continuous pulse ox and cardiac monitoring. Labs ordered/sent. Imaging ordered.   Differential diagnosis includes syncope of ?etiology, vasovagal syncope, anemia, uti, etc. Disposition decision including possible need for admission considered - will get labs and imaging and revisit dispo decision.   Reviewed nursing notes and prior charts for additional history. External reports reviewed. Additional history from: EMS.  Cardiac monitor: sinus rhythm, rate 90.  Labs reviewed/interpreted by me - cr mildly high compared to prior. Ivf. Trop normal. No chest pain or discomfort.   Pt denies cough or sob. Pulse ox recorded as borderline low on repeat - pt denies cough, fever or sob. Will check cxr.   Nausea improved but persists. Zofran iv. Recheck abd soft nt, no abd pain.   UA and cxr pending. Signed out to Dr Stark Jock to check pending studies, recheck pt, and dispo appropriately.            Final Clinical Impression(s) / ED Diagnoses Final diagnoses:  Syncope, unspecified syncope type  Vasovagal episode  AKI (acute kidney injury) ([place])  Dehydration    Rx / DC Orders ED Discharge Orders     None         Lajean Saver, MD 12/23/21 2340

## 2021-12-23 NOTE — ED Triage Notes (Signed)
Pt BIB EMS from home when she had syncopal episode while sitting. Pt states that she felt like she was having a hot flash before LOC. Pt A&O during triage. Pt states that she vomited and then passed out.

## 2021-12-24 ENCOUNTER — Encounter: Payer: Self-pay | Admitting: Family Medicine

## 2021-12-24 LAB — URINALYSIS, MICROSCOPIC (REFLEX)

## 2021-12-24 LAB — URINALYSIS, ROUTINE W REFLEX MICROSCOPIC
Bilirubin Urine: NEGATIVE
Glucose, UA: NEGATIVE mg/dL
Hgb urine dipstick: NEGATIVE
Ketones, ur: 15 mg/dL — AB
Leukocytes,Ua: NEGATIVE
Nitrite: NEGATIVE
Protein, ur: 100 mg/dL — AB
Specific Gravity, Urine: >=1.03 (ref 1.005–1.030)
pH: 5.5 (ref 5.0–8.0)

## 2021-12-24 NOTE — ED Provider Notes (Signed)
  Physical Exam  BP 117/70   Pulse 74   Temp 98.3 F (36.8 C)   Resp 17   Ht '5\' 2"'$  (1.575 m)   Wt 105.2 kg   SpO2 93%   BMI 42.43 kg/m   Physical Exam Vitals and nursing note reviewed.  Constitutional:      General: She is not in acute distress.    Appearance: She is well-developed. She is not diaphoretic.  HENT:     Head: Normocephalic and atraumatic.  Cardiovascular:     Rate and Rhythm: Normal rate and regular rhythm.     Heart sounds: No murmur heard.   No friction rub. No gallop.  Pulmonary:     Effort: Pulmonary effort is normal. No respiratory distress.     Breath sounds: Normal breath sounds. No wheezing.  Abdominal:     General: Bowel sounds are normal. There is no distension.     Palpations: Abdomen is soft.     Tenderness: There is no abdominal tenderness.  Musculoskeletal:        General: Normal range of motion.     Cervical back: Normal range of motion and neck supple.  Skin:    General: Skin is warm and dry.  Neurological:     General: No focal deficit present.     Mental Status: She is alert and oriented to person, place, and time.    Procedures  Procedures  ED Course / MDM   Care assumed from Dr. Vear Clock at shift change.  Patient awaiting results of urinalysis and chest x-ray.  The study have resulted and are unremarkable.  Patient back to baseline with no further episodes.  Etiology of her syncopal-like episodes is unclear, but doubt any emergent issues.  Patient ambulatory to the bathroom without difficulty and seems appropriate for discharge.      Veryl Speak, MD 12/24/21 [phone]

## 2021-12-24 NOTE — ED Notes (Signed)
Pt ambulated to bathroom with steady gait. 

## 2021-12-25 LAB — CBG MONITORING, ED: Glucose-Capillary: 119 mg/dL — ABNORMAL HIGH (ref 70–99)

## 2021-12-26 ENCOUNTER — Ambulatory Visit (INDEPENDENT_AMBULATORY_CARE_PROVIDER_SITE_OTHER): Payer: Medicare Other | Admitting: Family Medicine

## 2021-12-26 ENCOUNTER — Encounter: Payer: Self-pay | Admitting: Family Medicine

## 2021-12-26 VITALS — BP 128/84 | HR 78 | Temp 97.8°F | Ht 62.0 in | Wt 233.5 lb

## 2021-12-26 DIAGNOSIS — N179 Acute kidney failure, unspecified: Secondary | ICD-10-CM | POA: Diagnosis not present

## 2021-12-26 DIAGNOSIS — R55 Syncope and collapse: Secondary | ICD-10-CM | POA: Diagnosis not present

## 2021-12-26 DIAGNOSIS — M5136 Other intervertebral disc degeneration, lumbar region: Secondary | ICD-10-CM | POA: Diagnosis not present

## 2021-12-26 LAB — BASIC METABOLIC PANEL
BUN: 10 mg/dL (ref 6–23)
CO2: 30 mEq/L (ref 19–32)
Calcium: 9.6 mg/dL (ref 8.4–10.5)
Chloride: 104 mEq/L (ref 96–112)
Creatinine, Ser: 0.8 mg/dL (ref 0.40–1.20)
GFR: 74.87 mL/min (ref >60.00)
Glucose, Bld: 100 mg/dL — ABNORMAL HIGH (ref 70–99)
Potassium: 4.4 mEq/L (ref 3.5–5.1)
Sodium: 141 mEq/L (ref 135–145)

## 2021-12-26 NOTE — Patient Instructions (Signed)
Give us 2-3 business days to get the results of your labs back.   Stay hydrated.  Let us know if you need anything.  

## 2021-12-26 NOTE — Progress Notes (Signed)
Chief Complaint  Patient presents with   Follow-up    ER follow-up  Passed out/vomiting     Subjective: Patient is a 71 y.o. female here for ER follow-up.  She is here with her spouse.  3 days ago, the patient was at her birthday party.  She had a small amount of fast food prior to going to a party where she was dancing.  She did not drink as much as she normally does that day.  She also has some slight nausea.  After getting off of the dance floor, she saw stars and sat down.  The next thing she knew, her family members were around her saying she slumped over and had thrown up.  She went to the emergency department and the same thing happened again while in the waiting room.  Work-up was largely unremarkable with the exception of a creatinine bump of around 0.7.  She received 2 L of fluid and reported no further nausea.  This has since resolved.  She has not had any issues with lightheadedness, falls, or losing consciousness.  She did not bite her tongue or lose control of bowel/bladder function.  She did not consume any alcohol or illicit substances.  She was lucid after the episodes.  No fevers, recent illness otherwise, diarrhea.  Past Medical History:  Diagnosis Date   Allergy    SEASONAL   Anxiety    Arthritis    back   Asthma    Depression    Endometrial cancer ([place]) 2015   Family history of breast cancer    Family history of colon cancer    Family history of ovarian cancer    GERD (gastroesophageal reflux disease)    History of colonic diverticulitis    Hx of colonic polyps    Hyperlipidemia    Hypertension    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   Osteopenia 09/2017   T score -1.2 FRAX 3.2% / 0.3%   Personal history of chemotherapy 2015   Pre-diabetes    Radiation 12/17/14-01/03/15   vaginal brachytherapy   Thyroid disease 06/01/2013    Objective: BP 128/84   Pulse 78   Temp 97.8 F (36.6 C) (Oral)   Ht '5\' 2"'$  (1.575 m)   Wt 233 lb 8 oz (105.9 kg)   SpO2 99%   BMI  42.71 kg/m  General: Awake, appears stated age Heart: RRR, no LE edema, no bruits Lungs: CTAB, no rales, wheezes or rhonchi. No accessory muscle use Neuro: Gait is normal, DTRs equal and symmetric throughout, no clonus, no cerebellar signs, 5/5 strength throughout extremities Psych: Age appropriate judgment and insight, normal affect and mood  Assessment and Plan: Syncope, unspecified syncope type  AKI (acute kidney injury) ([place]) - Plan: Basic metabolic panel  Likely vasovagal syncope secondary to decreased oral intake of fluids and GI illness.  Arrhythmia, heart failure, and neurologic/seizure etiology less likely.  Symptoms have largely resolved.  We will recheck renal function.  Recommended continue Zofran as needed for nausea should return and pushing fluids.  She will let me know if anything changes. The patient and her spouse voiced understanding and agreement to the plan.  [place], DO 12/26/21  10:00 AM

## 2022-01-03 ENCOUNTER — Other Ambulatory Visit: Payer: Self-pay | Admitting: Family Medicine

## 2022-01-03 DIAGNOSIS — E669 Obesity, unspecified: Secondary | ICD-10-CM

## 2022-01-04 ENCOUNTER — Other Ambulatory Visit: Payer: Self-pay | Admitting: Family Medicine

## 2022-01-04 DIAGNOSIS — E1169 Type 2 diabetes mellitus with other specified complication: Secondary | ICD-10-CM

## 2022-01-24 ENCOUNTER — Ambulatory Visit (INDEPENDENT_AMBULATORY_CARE_PROVIDER_SITE_OTHER): Payer: Medicare Other | Admitting: Family Medicine

## 2022-01-24 ENCOUNTER — Encounter: Payer: Self-pay | Admitting: Family Medicine

## 2022-01-24 VITALS — BP 140/90 | HR 80 | Temp 97.2°F | Resp 16 | Ht 61.42 in | Wt 237.4 lb

## 2022-01-24 DIAGNOSIS — J4540 Moderate persistent asthma, uncomplicated: Secondary | ICD-10-CM

## 2022-01-24 DIAGNOSIS — K219 Gastro-esophageal reflux disease without esophagitis: Secondary | ICD-10-CM | POA: Diagnosis not present

## 2022-01-24 DIAGNOSIS — J310 Chronic rhinitis: Secondary | ICD-10-CM | POA: Diagnosis not present

## 2022-01-24 DIAGNOSIS — T7800XD Anaphylactic reaction due to unspecified food, subsequent encounter: Secondary | ICD-10-CM

## 2022-01-24 NOTE — Progress Notes (Signed)
[facility] [id number] Dept: [phone]  FOLLOW UP NOTE  Patient ID: DEMIAH Fisher, female    DOB: 1950-10-10  Age: 71 y.o. MRN: [id number] Date of Office Visit: 01/24/2022  Assessment  Chief Complaint: Asthma  HPI Katelyn Fisher is a 71 year old female who presents to the clinic for follow-up visit.  She was last seen in this clinic on 08/21/2021 by Katelyn Morgan, FNP, for evaluation of asthma, allergic rhinitis, reflux, and food allergy to shellfish.  At today's visit, she reports her asthma has been moderately well controlled with symptoms including occasional wheeze in the evening, and occasional cough which can be either dry or producing clear mucus.  She continues montelukast 10 mg once a day, Symbicort 160-2 puffs twice a day with a spacer, Spiriva 1.25 mcg about once a week, and albuterol about once a month with relief of symptoms.  Allergic rhinitis is reported as moderately well controlled with occasional sneeze as the main symptom.  She continues levocetirizine 5 mg once a day and infrequently uses Flonase as needed with relief of symptoms.  Reflux is reported as well controlled with no heartburn unless she is chewing peppermint gum.  She is not currently taking famotidine.  She continues to avoid shellfish with no accidental ingestion or EpiPen use since her last visit to this clinic.  Her current medications are listed in the chart.   Drug Allergies:  Allergies  Allergen Reactions   Shellfish Allergy Hives, Shortness Of Breath and Swelling   Oxycodone Swelling    Facial swelling and tightness   Penicillins Swelling    Rash with welps   Iodinated Contrast Media Rash   Sulfa Antibiotics Rash    Physical Exam: BP 140/90 (BP Location: Right Arm)   Pulse 80   Temp (!) 97.2 F (36.2 C) (Temporal)   Resp 16   Ht 5' 1.42" (1.56 m)   Wt 237 lb 6.4 oz (107.7 kg)   SpO2 98%   BMI 44.25 kg/m    Physical Exam Vitals reviewed.  Constitutional:       Appearance: Normal appearance.  HENT:     Head: Normocephalic and atraumatic.     Right Ear: Tympanic membrane normal.     Left Ear: Tympanic membrane normal.     Nose:     Comments: Bilateral nares slightly erythematous with clear nasal drainage noted.  Pharynx normal.  Ears normal.  Eyes normal.    Mouth/Throat:     Pharynx: Oropharynx is clear.  Eyes:     Conjunctiva/sclera: Conjunctivae normal.  Cardiovascular:     Rate and Rhythm: Normal rate and regular rhythm.     Heart sounds: Normal heart sounds. No murmur heard. Pulmonary:     Effort: Pulmonary effort is normal.     Breath sounds: Normal breath sounds.     Comments: Lungs clear to auscultation Musculoskeletal:        General: Normal range of motion.     Cervical back: Normal range of motion and neck supple.  Skin:    General: Skin is warm and dry.  Neurological:     Mental Status: She is alert and oriented to person, place, and time.  Psychiatric:        Mood and Affect: Mood normal.        Behavior: Behavior normal.        Thought Content: Thought content normal.        Judgment: Judgment normal.  Diagnostics: FVC 1.69, FEV1 1.28.  Predicted FVC 2.19, predicted FEV1 1.72.  Spirometry indicates normal ventilatory function.  Assessment and Plan: 1. Moderate persistent asthma without complication   2. Chronic rhinitis   3. Anaphylactic shock due to food, subsequent encounter   4. Gastroesophageal reflux disease without esophagitis     Patient Instructions  Asthma Continue montelukast (Singulair) 10 mg once a day to prevent cough or wheeze Continue Symbicort 160-2 puffs every twelve hours. Rinse mouth out after to help prevent thrush. Continue albuterol 2 puffs every 4 hours as needed for cough or wheeze OR Instead use albuterol 0.083% solution via nebulizer one unit vial every 4 hours as needed for cough or wheeze  For asthma flare, begin Spiriva 1.25 mcg - 2 puffs once a day for 2 weeks and call the  clinic  Allergic rhinitis Continue flonase nasal spray- using 1-2 sprays each nostril once a day as needed for stuffy nose Continue levocetirizine 5 mg one tablet once a day as needed for runny nose or itching May use saline nasal rinses as needed Consider updating your environmental allergy testing.  Remember to stop antihistamines for 3 days before your skin testing appointment.  Reflux Continue dietary and lifestyle modifications as listed below  Food allergy Continue to avoid shellfish.  In case of an allergic reaction, give Benadryl 50 mg  every 4 hours, and if life-threatening symptoms occur, inject with EpiPen 0.3 mg. Return to the clinic if you are interested in updating your food allergy testing.  Remember to stop antihistamines for 3 days before your food testing appointment.  Call the clinic if this treatment plan is not working well for you  Schedule follow up appointment in 6 months  Return in about 6 months (around 07/27/2022), or if symptoms worsen or fail to improve.    Thank you for the opportunity to care for this patient.  Please do not hesitate to contact me with questions.  Katelyn Morgan, FNP [facility] [place] [facility]

## 2022-01-24 NOTE — Patient Instructions (Addendum)
Asthma Continue montelukast (Singulair) 10 mg once a day to prevent cough or wheeze Continue albuterol 2 puffs every 4 hours as needed for cough or wheeze OR Instead use albuterol 0.083% solution via nebulizer one unit vial every 4 hours as needed for cough or wheeze  For asthma flare, begin Symbicort 160-2 puffs twice a day for 2 weeks or until cough and wheeze free  Allergic rhinitis Continue allergen avoidance measures directed toward dust mites as listed below Continue flonase nasal spray- using 1-2 sprays each nostril once a day as needed for stuffy nose Continue levocetirizine 5 mg one tablet once a day as needed for runny nose or itching May use saline nasal rinses as needed Consider updating your environmental allergy testing.  Remember to stop antihistamines for 3 days before your skin testing appointment.  Reflux Continue dietary and lifestyle modifications as listed below  Food allergy Continue to avoid shellfish.  In case of an allergic reaction, give Benadryl 50 mg  every 4 hours, and if life-threatening symptoms occur, inject with EpiPen 0.3 mg. Return to the clinic if you are interested in updating your food allergy testing.  Remember to stop antihistamines for 3 days before your food testing appointment.  Call the clinic if this treatment plan is not working well for you  Follow up in 6 months or sooner if needed.   Control of Dust Mite Allergen Dust mites play a major role in allergic asthma and rhinitis. They occur in environments with high humidity wherever human skin is found. Dust mites absorb humidity from the atmosphere (ie, they do not drink) and feed on organic matter (including shed human and animal skin). Dust mites are a microscopic type of insect that you cannot see with the naked eye. High levels of dust mites have been detected from mattresses, pillows, carpets, upholstered furniture, bed covers, clothes, soft toys and any woven material. The principal allergen  of the dust mite is found in its feces. A gram of dust may contain 1,000 mites and 250,000 fecal particles. Mite antigen is easily measured in the air during house cleaning activities. Dust mites do not bite and do not cause harm to humans, other than by triggering allergies/asthma.  Ways to decrease your exposure to dust mites in your home:  1. Encase mattresses, box springs and pillows with a mite-impermeable barrier or cover  2. Wash sheets, blankets and drapes weekly in hot water (130 F) with detergent and dry them in a dryer on the hot setting.  3. Have the room cleaned frequently with a vacuum cleaner and a damp dust-mop. For carpeting or rugs, vacuuming with a vacuum cleaner equipped with a high-efficiency particulate air (HEPA) filter. The dust mite allergic individual should not be in a room which is being cleaned and should wait 1 hour after cleaning before going into the room.  4. Do not sleep on upholstered furniture (eg, couches).  5. If possible removing carpeting, upholstered furniture and drapery from the home is ideal. Horizontal blinds should be eliminated in the rooms where the person spends the most time (bedroom, study, television room). Washable vinyl, roller-type shades are optimal.  6. Remove all non-washable stuffed toys from the bedroom. Wash stuffed toys weekly like sheets and blankets above.  7. Reduce indoor humidity to less than 50%. Inexpensive humidity monitors can be purchased at most hardware stores. Do not use a humidifier as can make the problem worse and are not recommended.  

## 2022-03-31 ENCOUNTER — Other Ambulatory Visit: Payer: Self-pay | Admitting: Family Medicine

## 2022-03-31 DIAGNOSIS — E1169 Type 2 diabetes mellitus with other specified complication: Secondary | ICD-10-CM

## 2022-04-05 ENCOUNTER — Other Ambulatory Visit (HOSPITAL_BASED_OUTPATIENT_CLINIC_OR_DEPARTMENT_OTHER): Payer: Self-pay

## 2022-04-06 ENCOUNTER — Encounter: Payer: Self-pay | Admitting: Family Medicine

## 2022-04-06 ENCOUNTER — Other Ambulatory Visit (INDEPENDENT_AMBULATORY_CARE_PROVIDER_SITE_OTHER): Payer: Medicare Other

## 2022-04-06 ENCOUNTER — Ambulatory Visit (INDEPENDENT_AMBULATORY_CARE_PROVIDER_SITE_OTHER): Payer: Medicare Other | Admitting: Family Medicine

## 2022-04-06 ENCOUNTER — Other Ambulatory Visit: Payer: Self-pay | Admitting: Family Medicine

## 2022-04-06 VITALS — BP 120/80 | HR 71 | Temp 98.0°F | Ht 62.0 in | Wt 237.4 lb

## 2022-04-06 DIAGNOSIS — E669 Obesity, unspecified: Secondary | ICD-10-CM | POA: Diagnosis not present

## 2022-04-06 DIAGNOSIS — E039 Hypothyroidism, unspecified: Secondary | ICD-10-CM

## 2022-04-06 DIAGNOSIS — I10 Essential (primary) hypertension: Secondary | ICD-10-CM

## 2022-04-06 DIAGNOSIS — E1169 Type 2 diabetes mellitus with other specified complication: Secondary | ICD-10-CM | POA: Diagnosis not present

## 2022-04-06 LAB — COMPREHENSIVE METABOLIC PANEL
ALT: 19 U/L (ref 0–35)
AST: 17 U/L (ref 0–37)
Albumin: 3.8 g/dL (ref 3.5–5.2)
Alkaline Phosphatase: 123 U/L — ABNORMAL HIGH (ref 39–117)
BUN: 12 mg/dL (ref 6–23)
CO2: 30 mEq/L (ref 19–32)
Calcium: 9.2 mg/dL (ref 8.4–10.5)
Chloride: 104 mEq/L (ref 96–112)
Creatinine, Ser: 0.81 mg/dL (ref 0.40–1.20)
GFR: 73.62 mL/min (ref >60.00)
Glucose, Bld: 105 mg/dL — ABNORMAL HIGH (ref 70–99)
Potassium: 4.3 mEq/L (ref 3.5–5.1)
Sodium: 141 mEq/L (ref 135–145)
Total Bilirubin: 0.3 mg/dL (ref 0.2–1.2)
Total Protein: 6.9 g/dL (ref 6.0–8.3)

## 2022-04-06 LAB — TSH: TSH: 5.55 u[IU]/mL — ABNORMAL HIGH (ref 0.35–5.50)

## 2022-04-06 LAB — LIPID PANEL
Cholesterol: 195 mg/dL (ref 0–200)
HDL: 58.6 mg/dL (ref >39.00)
LDL Cholesterol: 122 mg/dL — ABNORMAL HIGH (ref 0–99)
NonHDL: 136.04
Total CHOL/HDL Ratio: 3
Triglycerides: 71 mg/dL (ref 0.0–149.0)
VLDL: 14.2 mg/dL (ref 0.0–40.0)

## 2022-04-06 LAB — HEMOGLOBIN A1C: Hgb A1c MFr Bld: 7.1 % — ABNORMAL HIGH (ref 4.6–6.5)

## 2022-04-06 NOTE — Progress Notes (Signed)
Subjective:   Chief Complaint  Patient presents with   Follow-up    6 month   Knee Pain    Right     Katelyn Fisher is a 71 y.o. female here for follow-up of diabetes.   Katelyn Fisher does not routinely check  Patient does not require insulin.   Medications include: diet controlled Diet is healthy.  Exercise: walking  Hypertension Patient presents for hypertension follow up. She does monitor home blood pressures. Blood pressures ranging on average from 120's/70's. She is compliant with medications- Norvasc 10 mg/d, benazapril 20 mg/d. Patient has these side effects of medication: none Diet/exercise as above. No CP or SOB.  Hypothyroidism Patient presents for follow-up of hypothyroidism.  Reports compliance with medication- levothyroxine 150 mcg/d.  Current symptoms include: denies fatigue, weight changes, heat/cold intolerance, bowel/skin changes or CVS symptoms She believes her dose should be unchanged  Past Medical History:  Diagnosis Date   Allergy    SEASONAL   Anxiety    Arthritis    back   Asthma    Depression    Endometrial cancer ([place]) 2015   Family history of breast cancer    Family history of colon cancer    Family history of ovarian cancer    GERD (gastroesophageal reflux disease)    History of colonic diverticulitis    Hx of colonic polyps    Hyperlipidemia    Hypertension    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   Osteopenia 09/2017   T score -1.2 FRAX 3.2% / 0.3%   Personal history of chemotherapy 2015   Radiation 12/17/14-01/03/15   vaginal brachytherapy   Thyroid disease 06/01/2013     Related testing: Retinal exam: Done Pneumovax: done  Objective:  BP 120/80   Pulse 71   Temp 98 F (36.7 C) (Oral)   Ht '5\' 2"'$  (1.575 m)   Wt 237 lb 6 oz (107.7 kg)   SpO2 93%   BMI 43.42 kg/m  General:  Well developed, well nourished, in no apparent distress Skin:  Warm, no pallor or diaphoresis Lungs:  CTAB, no access msc use Cardio:  RRR, no  bruits Psych: Age appropriate judgment and insight  Assessment:   Diabetes mellitus type 2 in obese ([place]) - Plan: Lipid panel, Hemoglobin A1c, Comprehensive metabolic panel  Essential hypertension  Hypothyroidism, unspecified type - Plan: TSH   Plan:   Chronic, stable. Cont diet control. She is on a statin. Counseled on diet and exercise. Chronic, stable. Cont benazapril 20 mg/d, Norvasc 10 mg/d.  Chronic, stable. Cont levothyroxine. 150 mcg/d.  F/u in 6 mo. The patient voiced understanding and agreement to the plan.  [place], DO 04/06/22 8:32 AM

## 2022-04-06 NOTE — Patient Instructions (Addendum)
Give us 2-3 business days to get the results of your labs back.   Keep the diet clean and stay active.  I recommend getting the flu shot in mid October. This suggestion would change if the CDC comes out with a different recommendation.   Let us know if you need anything. 

## 2022-04-08 ENCOUNTER — Encounter: Payer: Self-pay | Admitting: Family Medicine

## 2022-04-09 ENCOUNTER — Other Ambulatory Visit: Payer: Self-pay | Admitting: Family Medicine

## 2022-04-09 DIAGNOSIS — R748 Abnormal levels of other serum enzymes: Secondary | ICD-10-CM

## 2022-04-09 LAB — T4, FREE: Free T4: 0.92 ng/dL (ref 0.60–1.60)

## 2022-04-09 NOTE — Telephone Encounter (Signed)
No [place] orders please advise

## 2022-04-10 ENCOUNTER — Ambulatory Visit (HOSPITAL_BASED_OUTPATIENT_CLINIC_OR_DEPARTMENT_OTHER)
Admission: RE | Admit: 2022-04-10 | Discharge: 2022-04-10 | Disposition: A | Discharge status: Home / Self Care | Payer: Medicare Other | Source: Ambulatory Visit | Attending: Family Medicine | Admitting: Family Medicine

## 2022-04-10 DIAGNOSIS — R748 Abnormal levels of other serum enzymes: Secondary | ICD-10-CM | POA: Insufficient documentation

## 2022-04-10 DIAGNOSIS — Z9049 Acquired absence of other specified parts of digestive tract: Secondary | ICD-10-CM | POA: Diagnosis not present

## 2022-05-01 ENCOUNTER — Other Ambulatory Visit (HOSPITAL_BASED_OUTPATIENT_CLINIC_OR_DEPARTMENT_OTHER): Payer: Self-pay

## 2022-05-01 DIAGNOSIS — Z79891 Long term (current) use of opiate analgesic: Secondary | ICD-10-CM | POA: Diagnosis not present

## 2022-05-01 DIAGNOSIS — M5136 Other intervertebral disc degeneration, lumbar region: Secondary | ICD-10-CM | POA: Diagnosis not present

## 2022-05-01 DIAGNOSIS — Z5181 Encounter for therapeutic drug level monitoring: Secondary | ICD-10-CM | POA: Diagnosis not present

## 2022-05-01 DIAGNOSIS — M47896 Other spondylosis, lumbar region: Secondary | ICD-10-CM | POA: Diagnosis not present

## 2022-05-01 MED ORDER — TRAMADOL HCL 50 MG PO TABS
50.0000 mg | ORAL_TABLET | Freq: Three times a day (TID) | ORAL | 0 refills | Status: DC | PRN
  Filled 2022-05-01: qty 30, 10d supply, fill #0

## 2022-05-02 ENCOUNTER — Other Ambulatory Visit (HOSPITAL_BASED_OUTPATIENT_CLINIC_OR_DEPARTMENT_OTHER): Payer: Self-pay

## 2022-05-07 ENCOUNTER — Ambulatory Visit (INDEPENDENT_AMBULATORY_CARE_PROVIDER_SITE_OTHER): Payer: Medicare Other | Admitting: *Deleted

## 2022-05-07 DIAGNOSIS — Z0000 Encounter for general adult medical examination without abnormal findings: Secondary | ICD-10-CM

## 2022-05-07 NOTE — Patient Instructions (Signed)
Katelyn Fisher , Thank you for taking time to come for your Medicare Wellness Visit. I appreciate your ongoing commitment to your health goals. Please review the following plan we discussed and let me know if I can assist you in the future.   These are the goals we discussed:  Goals       Increase physical activity      Continue chair exercises and maybe implement Walk Away the Pounds Katelyn Fisher       maintain health (pt-stated)      Patient Stated      Maintain current goals      Reduce portion size        This is a list of the screening recommended for you and due dates:  Health Maintenance  Topic Date Due   Flu Shot  02/20/2022   Yearly kidney health urinalysis for diabetes  10/05/2022   Complete foot exam   10/05/2022   Hemoglobin A1C  10/05/2022   Eye exam for diabetics  12/14/2022   Yearly kidney function blood test for diabetes  04/07/2023   Mammogram  12/12/2023   Tetanus Vaccine  05/16/2028   Colon Cancer Screening  11/28/2031   Pneumonia Vaccine  Completed   DEXA scan (bone density measurement)  Completed   Hepatitis C Screening: USPSTF Recommendation to screen - Ages 43-79 yo.  Completed   Zoster (Shingles) Vaccine  Completed   HPV Vaccine  Aged Out   COVID-19 Vaccine  Discontinued     Next appointment: Follow up in one year for your annual wellness visit 05/09/23 9:00am (telephone)   Preventive Care 65 Years and Older, Female Preventive care refers to lifestyle choices and visits with your health care provider that can promote health and wellness. What does preventive care include? A yearly physical exam. This is also called an annual well check. Dental exams once or twice a year. Routine eye exams. Ask your health care provider how often you should have your eyes checked. Personal lifestyle choices, including: Daily care of your teeth and gums. Regular physical activity. Eating a healthy diet. Avoiding tobacco and drug use. Limiting alcohol  use. Practicing safe sex. Taking low-dose aspirin every day. Taking vitamin and mineral supplements as recommended by your health care provider. What happens during an annual well check? The services and screenings done by your health care provider during your annual well check will depend on your age, overall health, lifestyle risk factors, and family history of disease. Counseling  Your health care provider may ask you questions about your: Alcohol use. Tobacco use. Drug use. Emotional well-being. Home and relationship well-being. Sexual activity. Eating habits. History of falls. Memory and ability to understand (cognition). Work and work Statistician. Reproductive health. Screening  You may have the following tests or measurements: Height, weight, and BMI. Blood pressure. Lipid and cholesterol levels. These may be checked every 5 years, or more frequently if you are over 83 years old. Skin check. Lung cancer screening. You may have this screening every year starting at age 30 if you have a 30-pack-year history of smoking and currently smoke or have quit within the past 15 years. Fecal occult blood test (FOBT) of the stool. You may have this test every year starting at age 37. Flexible sigmoidoscopy or colonoscopy. You may have a sigmoidoscopy every 5 years or a colonoscopy every 10 years starting at age 37. Hepatitis C blood test. Hepatitis B blood test. Sexually transmitted disease (STD) testing. Diabetes screening. This is done  by checking your blood sugar (glucose) after you have not eaten for a while (fasting). You may have this done every 1-3 years. Bone density scan. This is done to screen for osteoporosis. You may have this done starting at age 24. Mammogram. This may be done every 1-2 years. Talk to your health care provider about how often you should have regular mammograms. Talk with your health care provider about your test results, treatment options, and if necessary,  the need for more tests. Vaccines  Your health care provider may recommend certain vaccines, such as: Influenza vaccine. This is recommended every year. Tetanus, diphtheria, and acellular pertussis (Tdap, Td) vaccine. You may need a Td booster every 10 years. Zoster vaccine. You may need this after age 21. Pneumococcal 13-valent conjugate (PCV13) vaccine. One dose is recommended after age 17. Pneumococcal polysaccharide (PPSV23) vaccine. One dose is recommended after age 72. Talk to your health care provider about which screenings and vaccines you need and how often you need them. This information is not intended to replace advice given to you by your health care provider. Make sure you discuss any questions you have with your health care provider. Document Released: 08/05/2015 Document Revised: 03/28/2016 Document Reviewed: 05/10/2015 Elsevier Interactive Patient Education  2017 [place] Prevention in the Home Falls can cause injuries. They can happen to people of all ages. There are many things you can do to make your home safe and to help prevent falls. What can I do on the outside of my home? Regularly fix the edges of walkways and driveways and fix any cracks. Remove anything that might make you trip as you walk through a door, such as a raised step or threshold. Trim any bushes or trees on the path to your home. Use bright outdoor lighting. Clear any walking paths of anything that might make someone trip, such as rocks or tools. Regularly check to see if handrails are loose or broken. Make sure that both sides of any steps have handrails. Any raised decks and porches should have guardrails on the edges. Have any leaves, snow, or ice cleared regularly. Use sand or salt on walking paths during winter. Clean up any spills in your garage right away. This includes oil or grease spills. What can I do in the bathroom? Use night lights. Install grab bars by the toilet and in the  tub and shower. Do not use towel bars as grab bars. Use non-skid mats or decals in the tub or shower. If you need to sit down in the shower, use a plastic, non-slip stool. Keep the floor dry. Clean up any water that spills on the floor as soon as it happens. Remove soap buildup in the tub or shower regularly. Attach bath mats securely with double-sided non-slip rug tape. Do not have throw rugs and other things on the floor that can make you trip. What can I do in the bedroom? Use night lights. Make sure that you have a light by your bed that is easy to reach. Do not use any sheets or blankets that are too big for your bed. They should not hang down onto the floor. Have a firm chair that has side arms. You can use this for support while you get dressed. Do not have throw rugs and other things on the floor that can make you trip. What can I do in the kitchen? Clean up any spills right away. Avoid walking on wet floors. Keep items that you use  a lot in easy-to-reach places. If you need to reach something above you, use a strong step stool that has a grab bar. Keep electrical cords out of the way. Do not use floor polish or wax that makes floors slippery. If you must use wax, use non-skid floor wax. Do not have throw rugs and other things on the floor that can make you trip. What can I do with my stairs? Do not leave any items on the stairs. Make sure that there are handrails on both sides of the stairs and use them. Fix handrails that are broken or loose. Make sure that handrails are as long as the stairways. Check any carpeting to make sure that it is firmly attached to the stairs. Fix any carpet that is loose or worn. Avoid having throw rugs at the top or bottom of the stairs. If you do have throw rugs, attach them to the floor with carpet tape. Make sure that you have a light switch at the top of the stairs and the bottom of the stairs. If you do not have them, ask someone to add them for  you. What else can I do to help prevent falls? Wear shoes that: Do not have high heels. Have rubber bottoms. Are comfortable and fit you well. Are closed at the toe. Do not wear sandals. If you use a stepladder: Make sure that it is fully opened. Do not climb a closed stepladder. Make sure that both sides of the stepladder are locked into place. Ask someone to hold it for you, if possible. Clearly mark and make sure that you can see: Any grab bars or handrails. First and last steps. Where the edge of each step is. Use tools that help you move around (mobility aids) if they are needed. These include: Canes. Walkers. Scooters. Crutches. Turn on the lights when you go into a dark area. Replace any light bulbs as soon as they burn out. Set up your furniture so you have a clear path. Avoid moving your furniture around. If any of your floors are uneven, fix them. If there are any pets around you, be aware of where they are. Review your medicines with your doctor. Some medicines can make you feel dizzy. This can increase your chance of falling. Ask your doctor what other things that you can do to help prevent falls. This information is not intended to replace advice given to you by your health care provider. Make sure you discuss any questions you have with your health care provider. Document Released: 05/05/2009 Document Revised: 12/15/2015 Document Reviewed: 08/13/2014 Elsevier Interactive Patient Education  2017 [employer].

## 2022-05-07 NOTE — Progress Notes (Signed)
Subjective:   Katelyn Fisher is a 71 y.o. female who presents for Medicare Annual (Subsequent) preventive examination.  I connected with  [facility] on 05/07/22 by a audio enabled telemedicine application and verified that I am speaking with the correct person using two identifiers.  Patient Location: Home  Provider Location: Office/Clinic  I discussed the limitations of evaluation and management by telemedicine. The patient expressed understanding and agreed to proceed.   Review of Systems    Defer to PCP Cardiac Risk Factors include: advanced age (>30mn, >>84women);diabetes mellitus;dyslipidemia;hypertension     Objective:    There were no vitals filed for this visit. There is no height or weight on file to calculate BMI.     05/07/2022    9:03 AM 12/23/2021    9:21 PM 05/03/2021    9:08 AM 12/17/2019    8:09 AM 06/04/2019   11:02 AM 12/16/2018    8:12 AM 09/25/2018    9:09 AM  Advanced Directives  Does Patient Have a Medical Advance Directive? No;Yes Yes Yes No Yes Yes Yes  Type of [place] will Healthcare Power of [place] will Living will  Does patient want to make changes to medical advance directive? No - Patient declined  Yes (MAU/Ambulatory/Procedural Areas - Information given)  No - Patient declined No - Patient declined No - Patient declined  Copy of [place] Chart? Yes - validated most recent copy scanned in chart (See row information)  No - copy requested   No - copy requested   Would patient like information on creating a medical advance directive? No - Patient declined   No - Patient declined       Current Medications (verified) Outpatient Encounter Medications as of 05/07/2022  Medication Sig   albuterol (PROAIR HFA) 108 (90 Base) MCG/ACT inhaler inhale 2  puffs by mouth every 4 hours as needed for coughing  or wheezing spells   albuterol (PROVENTIL) (2.5 MG/3ML) 0.083% nebulizer solution Take 3 mLs (2.5 mg total) by nebulization every 4 (four) hours as needed for wheezing or shortness of breath.   amLODipine (NORVASC) 10 MG tablet TAKE 1 TABLET BY MOUTH DAILY   atorvastatin (LIPITOR) 40 MG tablet TAKE 1 TABLET BY MOUTH DAILY   benazepril (LOTENSIN) 20 MG tablet TAKE 1 TABLET BY MOUTH AT  BEDTIME   betamethasone dipropionate (DIPROLENE) 0.05 % ointment 1 application Externally Once a day   budesonide-formoterol (SYMBICORT) 160-4.5 MCG/ACT inhaler Inhale 2 puffs into the lungs as needed.   carvedilol (COREG) 12.5 MG tablet TAKE 1 TABLET BY MOUTH TWICE  DAILY WITH A MEAL   EPINEPHrine (EPIPEN 2-PAK) 0.3 mg/0.3 mL IJ SOAJ injection Use as directed for severe allergic reaction   furosemide (LASIX) 20 MG tablet TAKE 1 TABLET BY MOUTH DAILY   ketoconazole (NIZORAL) 2 % cream Apply topically daily.   levocetirizine (XYZAL) 5 MG tablet Take 1 tablet (5 mg total) by mouth every evening.   levothyroxine (SYNTHROID) 75 MCG tablet TAKE 1 TABLET (75 MCG TOTAL) BY MOUTH DAILY BEFORE BREAKFAST. Oral for 30   montelukast (SINGULAIR) 10 MG tablet Take 1 tablet (10 mg total) by mouth at bedtime.   Multiple Vitamin (MULTIVITAMIN) tablet Take 1 tablet by mouth daily.   traMADol (ULTRAM) 50 MG tablet Take 1 tablet by mouth 3 times a day as needed.   traMADol (ULTRAM) 50 MG tablet Take  1 tablet (50 mg total) by mouth 3 (three) times daily as needed.   [DISCONTINUED] fluticasone (FLOVENT HFA) 110 MCG/ACT inhaler Inhale 2 puffs into the lungs in the morning and at bedtime for 14 days.   No facility-administered encounter medications on file as of 05/07/2022.    Allergies (verified) Shellfish allergy, Oxycodone, Penicillins, Iodinated contrast media, and Sulfa antibiotics   History: Past Medical History:  Diagnosis Date   Allergy    SEASONAL   Anxiety    Arthritis    back   Asthma    Depression    Endometrial  cancer ([place]) 2015   Family history of breast cancer    Family history of colon cancer    Family history of ovarian cancer    GERD (gastroesophageal reflux disease)    History of colonic diverticulitis    Hx of colonic polyps    Hyperlipidemia    Hypertension    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   Osteopenia 09/2017   T score -1.2 FRAX 3.2% / 0.3%   Personal history of chemotherapy 2015   Radiation 12/17/14-01/03/15   vaginal brachytherapy   Thyroid disease 06/01/2013   Past Surgical History:  Procedure Laterality Date   ABDOMINAL HYSTERECTOMY  06/23/2014   [facility], TRH/BSO   CHOLECYSTECTOMY  1991   COLONOSCOPY     DILATION AND CURETTAGE OF UTERUS     IR REMOVAL TUN ACCESS W/ PORT W/O FL MOD SED  04/30/2017   POLYPECTOMY     TUBAL LIGATION     Family History  Problem Relation Age of Onset   Diabetes Mother    Lupus Mother    Prostate cancer Father 60   Diabetes Father    Cancer Father        lung cancer, asbestos exposure   Hyperlipidemia Sister    Heart disease Brother    Diabetes Brother    Heart disease Brother    Heart disease Brother    Diabetes Brother    Colon polyps Brother    Heart disease Brother    Diabetes Brother    Colon polyps Brother    Heart disease Maternal Aunt        x 3 -2 brothers   Hypertension Maternal Aunt    Breast cancer Maternal Aunt        maternal half; dx in her 5s   Cancer Maternal Aunt        cervical   Ovarian cancer Maternal Aunt        dx in her 28s   Leukemia Maternal Uncle 70   Cancer Paternal Aunt        NOS- breast    Prostate cancer Paternal Uncle        dx in his 74s   Colon cancer Maternal Grandmother        dx in her 19s   Breast cancer Other        dx in her 55s   Esophageal cancer Neg Hx    Crohn's disease Neg Hx    Rectal cancer Neg Hx    Stomach cancer Neg Hx    Social History   Socioeconomic History   Marital status: Married    Spouse name: Not on file   Number of children: 3   Years of education:  Not on file   Highest education level: Not on file  Occupational History   Occupation: retired Network engineer  Tobacco Use   Smoking status: Never    Passive exposure: Never  Smokeless tobacco: Never  Vaping Use   Vaping Use: Never used  Substance and Sexual Activity   Alcohol use: No    Alcohol/week: 0.0 standard drinks of alcohol   Drug use: No   Sexual activity: Yes    Partners: Male    Birth control/protection: Surgical    Comment: 1st intercourse- 15, partners- 40, hysterectomy  Other Topics Concern   Not on file  Social History Narrative   Married- 59 years   Never Smoked   Alcohol use-no   Drug use-no   Occupation: housewife   Caffeine use/day:  None   Does Patient Exercise:  yes   Social Determinants of Health   Financial Resource Strain: Low Risk  (05/03/2021)   Overall Financial Resource Strain (CARDIA)    Difficulty of Paying Living Expenses: Not hard at all  Food Insecurity: No Food Insecurity (05/03/2021)   Hunger Vital Sign    Worried About Running Out of Food in the Last Year: Never true    [place] in the Last Year: Never true  Transportation Needs: No Transportation Needs (05/03/2021)   PRAPARE - Hydrologist (Medical): No    Lack of Transportation (Non-Medical): No  Physical Activity: Insufficiently Active (05/03/2021)   Exercise Vital Sign    Days of Exercise per Week: 3 days    Minutes of Exercise per Session: 40 min  Stress: No Stress Concern Present (05/03/2021)   [place]    Feeling of Stress : Only a little  Social Connections: Socially Integrated (05/03/2021)   Social Connection and Isolation Panel [NHANES]    Frequency of Communication with Friends and Family: More than three times a week    Frequency of Social Gatherings with Friends and Family: Once a week    Attends Religious Services: More than 4 times per year    Active Member of [employer]  or Organizations: Yes    Attends Music therapist: More than 4 times per year    Marital Status: Married    Tobacco Counseling Counseling given: Not Answered   Clinical Intake:  Pre-visit preparation completed: Yes  Pain : No/denies pain   How often do you need to have someone help you when you read instructions, pamphlets, or other written materials from your doctor or pharmacy?: 1 - Never  Diabetic? Yes Nutrition Risk Assessment:  Has the patient had any N/V/D within the last 2 months?  No  Does the patient have any non-healing wounds?  No  Has the patient had any unintentional weight loss or weight gain?  No   Diabetes:  Is the patient diabetic?  Yes  If diabetic, was a CBG obtained today?  No  Did the patient bring in their glucometer from home?   Audio visit How often do you monitor your CBG's? Never.   Financial Strains and Diabetes Management:  Are you having any financial strains with the device, your supplies or your medication? No .  Does the patient want to be seen by Chronic Care Management for management of their diabetes?  No  Would the patient like to be referred to a Nutritionist or for Diabetic Management?  No   Diabetic Exams:  Diabetic Eye Exam: Completed 12/13/21 Diabetic Foot Exam: Completed 10/04/21    Interpreter Needed?: No  Information entered by :: Beatris Ship, CMA   Activities of Daily Living    05/07/2022    9:04 AM  In your present state of health, do you have any difficulty performing the following activities:  Hearing? 0  Vision? 0  Difficulty concentrating or making decisions? 0  Walking or climbing stairs? 1  Dressing or bathing? 0  Doing errands, shopping? 0  Preparing Food and eating ? N  Using the Toilet? N  In the past six months, have you accidently leaked urine? Y  Comment every once in a while  Do you have problems with loss of bowel control? N  Managing your Medications? N  Managing your Finances? N   Housekeeping or managing your Housekeeping? N    Patient Care Team: Shelda Pal, DO as PCP - General (Family Medicine) Suella Broad, MD as Consulting Physician (Physical Medicine and Rehabilitation) Gordy Levan, MD as Consulting Physician (Oncology) Janie Morning, MD as Attending Physician (Obstetrics and Gynecology) Eppie Gibson, MD as Attending Physician (Radiation Oncology) Rosemarie Ax, MD as Consulting Physician (Family Medicine) Princess Bruins, MD as Consulting Physician (Obstetrics and Gynecology)  Indicate any recent Medical Services you may have received from other than [facility] providers in the past year (date may be approximate).     Assessment:   This is a routine wellness examination for [place].  Hearing/Vision screen No results found.  Dietary issues and exercise activities discussed: Current Exercise Habits: Home exercise routine, Type of exercise: walking, Time (Minutes): 20, Frequency (Times/Week): 3, Weekly Exercise (Minutes/Week): 60, Intensity: Mild, Exercise limited by: None identified   Goals Addressed   None    Depression Screen    05/07/2022    9:03 AM 05/03/2021    9:13 AM 12/21/2020    2:26 PM 12/17/2019    8:13 AM 12/16/2018    8:13 AM 09/09/2017    8:07 AM 09/26/2016   11:07 AM  PHQ 2/9 Scores  PHQ - 2 Score 0 0 0 0 0 0 0    Fall Risk    05/07/2022    9:03 AM 05/03/2021    9:11 AM 12/21/2020    2:25 PM 12/17/2019    8:13 AM 12/16/2018    8:13 AM  [place] in the past year? 0 0 0 0 0  Number falls in past yr: 0 0 0 0   Injury with Fall? 0 0 0 0   Risk for fall due to : No Fall Risks No Fall Risks No Fall Risks    Follow up Falls evaluation completed Falls evaluation completed Falls evaluation completed Education provided;Falls prevention discussed     FALL RISK PREVENTION PERTAINING TO THE HOME:  Any stairs in or around the home? No  If so, are there any without handrails?  No stairs Home free of  loose throw rugs in walkways, pet beds, electrical cords, etc? Yes  Adequate lighting in your home to reduce risk of falls? Yes   ASSISTIVE DEVICES UTILIZED TO PREVENT FALLS:  Life alert? No  Use of a cane, walker or w/c? No  Grab bars in the bathroom? Yes  Shower chair or bench in shower? Yes  Elevated toilet seat or a handicapped toilet? No   TIMED UP AND GO:  Was the test performed?  Audio visit .   Cognitive Function:    09/09/2017    8:07 AM  MMSE - Mini Mental State Exam  Orientation to time 5  Orientation to Place 5  Registration 3  Attention/ Calculation 5  Recall 3  Language- name 2 objects 2  Language- repeat 1  Language-  follow 3 step command 3  Language- read & follow direction 1  Write a sentence 1  Copy design 1  Total score 30        05/07/2022    9:08 AM  6CIT Screen  What Year? 0 points  What month? 0 points  What time? 0 points  Count back from 20 0 points  Months in reverse 0 points  Repeat phrase 0 points  Total Score 0 points    Immunizations Immunization History  Administered Date(s) Administered   Fluad Quad(high Dose 65+) 04/30/2019, 05/11/2020, 05/11/2021   Influenza Split 06/11/2011, 05/21/2012   Influenza Whole 06/24/2007, 04/16/2008, 05/02/2010   Influenza, High Dose Seasonal PF 05/21/2017   Influenza,inj,Quad PF,6+ Mos 06/01/2013, 05/19/2015, 03/30/2016, 05/16/2018   Influenza-Unspecified 05/23/2014, 05/21/2017   PFIZER(Purple Top)SARS-COV-2 Vaccination 08/29/2019, 09/19/2019, 05/16/2020   PNEUMOCOCCAL CONJUGATE-20 10/04/2021   Pneumococcal Conjugate-13 05/16/2018   Pneumococcal Polysaccharide-23 09/24/2016   Td 09/26/2007   Tdap 05/16/2018   Zoster Recombinat (Shingrix) 01/25/2021, 11/15/2021    TDAP status: Up to date  Flu Vaccine status: Due, Education has been provided regarding the importance of this vaccine. Advised may receive this vaccine at local pharmacy or Health Dept. Aware to provide a copy of the vaccination  record if obtained from local pharmacy or Health Dept. Verbalized acceptance and understanding.  Pneumococcal vaccine status: Up to date  Covid-19 vaccine status: Information provided on how to obtain vaccines.   Qualifies for Shingles Vaccine? Yes   Zostavax completed No   Shingrix Completed?: Yes  Screening Tests Health Maintenance  Topic Date Due   INFLUENZA VACCINE  02/20/2022   Diabetic kidney evaluation - Urine ACR  10/05/2022   FOOT EXAM  10/05/2022   HEMOGLOBIN A1C  10/05/2022   OPHTHALMOLOGY EXAM  12/14/2022   Diabetic kidney evaluation - GFR measurement  04/07/2023   MAMMOGRAM  12/12/2023   TETANUS/TDAP  05/16/2028   COLONOSCOPY (Pts 45-61yr Insurance coverage will need to be confirmed)  11/28/2031   Pneumonia Vaccine 70 Years old  Completed   DEXA SCAN  Completed   Hepatitis C Screening  Completed   Zoster Vaccines- Shingrix  Completed   HPV VACCINES  Aged Out   COVID-19 Vaccine  Discontinued    Health Maintenance  Health Maintenance Due  Topic Date Due   INFLUENZA VACCINE  02/20/2022    Colorectal cancer screening: Type of screening: Colonoscopy. Completed 11/27/21. Repeat every 10 years  Mammogram status: Completed 12/11/21. Repeat every year  Bone Density status: Completed 10/25/21. Results reflect: Bone density results: OSTEOPENIA. Repeat every 2 years.  Lung Cancer Screening: (Low Dose CT Chest recommended if Age 70-80years, 30 pack-year currently smoking OR have quit w/in 15years.) does not qualify.   Lung Cancer Screening Referral: N/a  Additional Screening:  Hepatitis C Screening: does qualify; Completed 09/24/16  Vision Screening: Recommended annual ophthalmology exams for early detection of glaucoma and other disorders of the eye. Is the patient up to date with their annual eye exam?  Yes  Who is the provider or what is the name of the office in which the patient attends annual eye exams? [facility] If pt is not established with a provider, would  they like to be referred to a provider to establish care? No .   Dental Screening: Recommended annual dental exams for proper oral hygiene  Community Resource Referral / Chronic Care Management: CRR required this visit?  No   CCM required this visit?  No  Plan:     I have personally reviewed and noted the following in the patient's chart:   Medical and social history Use of alcohol, tobacco or illicit drugs  Current medications and supplements including opioid prescriptions. Patient is currently taking opioid prescriptions. Information provided to patient regarding non-opioid alternatives. Patient advised to discuss non-opioid treatment plan with their provider. Functional ability and status Nutritional status Physical activity Advanced directives List of other physicians Hospitalizations, surgeries, and ER visits in previous 12 months Vitals Screenings to include cognitive, depression, and falls Referrals and appointments  In addition, I have reviewed and discussed with patient certain preventive protocols, quality metrics, and best practice recommendations. A written personalized care plan for preventive services as well as general preventive health recommendations were provided to patient.   Due to this being a telephonic visit, the after visit summary with patients personalized plan was offered to patient via mail or my-chart. Patient would like to access on my-chart.  Beatris Ship, [place]   05/07/2022   Nurse Notes: None

## 2022-05-09 ENCOUNTER — Encounter: Payer: Self-pay | Admitting: Family Medicine

## 2022-05-10 ENCOUNTER — Encounter: Payer: Self-pay | Admitting: Family Medicine

## 2022-05-10 ENCOUNTER — Ambulatory Visit (INDEPENDENT_AMBULATORY_CARE_PROVIDER_SITE_OTHER): Payer: Medicare Other | Admitting: *Deleted

## 2022-05-10 ENCOUNTER — Other Ambulatory Visit (HOSPITAL_BASED_OUTPATIENT_CLINIC_OR_DEPARTMENT_OTHER): Payer: Self-pay

## 2022-05-10 DIAGNOSIS — Z23 Encounter for immunization: Secondary | ICD-10-CM | POA: Diagnosis not present

## 2022-05-10 NOTE — Progress Notes (Signed)
Patient here for high dose flu vaccine.  Vaccine given in left deltoid and patient tolerated well.  

## 2022-07-02 NOTE — Progress Notes (Unsigned)
   [place] [id number] Dept: [phone]  FOLLOW UP NOTE  Patient ID: Katelyn Fisher, female    DOB: 1951/06/22  Age: 71 y.o. MRN: [id number] Date of Office Visit: 07/03/2022  Assessment  Chief Complaint: No chief complaint on file.  HPI Katelyn Fisher is a 71 year old female who presents to the clinic for follow-up visit.  She was last seen in this clinic on 01/24/2022 by Gareth Morgan, FNP, for evaluation of asthma and allergic rhinitis.  Her last environmental allergy skin testing was on 05/25/2010 that was positive to dust mite and negative to the remainder of the adult environmental panel.   Drug Allergies:  Allergies  Allergen Reactions   Shellfish Allergy Hives, Shortness Of Breath and Swelling   Oxycodone Swelling    Facial swelling and tightness   Penicillins Swelling    Rash with welps   Iodinated Contrast Media Rash   Sulfa Antibiotics Rash    Physical Exam: There were no vitals taken for this visit.   Physical Exam  Diagnostics:    Assessment and Plan: No diagnosis found.  No orders of the defined types were placed in this encounter.   There are no Patient Instructions on file for this visit.  No follow-ups on file.    Thank you for the opportunity to care for this patient.  Please do not hesitate to contact me with questions.  Gareth Morgan, FNP [facility] [place] [facility] [place]

## 2022-07-02 NOTE — Patient Instructions (Incomplete)
Asthma Continue montelukast (Singulair) 10 mg once a day to prevent cough or wheeze Continue albuterol 2 puffs every 4 hours as needed for cough or wheeze OR Instead use albuterol 0.083% solution via nebulizer one unit vial every 4 hours as needed for cough or wheeze  For asthma flare, begin Symbicort 160-2 puffs twice a day for 2 weeks or until cough and wheeze free  Allergic rhinitis Continue allergen avoidance measures directed toward dust mites as listed below Continue flonase nasal spray- using 1-2 sprays each nostril once a day as needed for stuffy nose Continue levocetirizine 5 mg one tablet once a day as needed for runny nose or itching May use saline nasal rinses as needed Consider updating your environmental allergy testing.  Remember to stop antihistamines for 3 days before your skin testing appointment.  Reflux Continue dietary and lifestyle modifications as listed below  Food allergy Continue to avoid shellfish.  In case of an allergic reaction, give Benadryl 50 mg  every 4 hours, and if life-threatening symptoms occur, inject with EpiPen 0.3 mg. Return to the clinic if you are interested in updating your food allergy testing.  Remember to stop antihistamines for 3 days before your food testing appointment.  Call the clinic if this treatment plan is not working well for you  Follow up in 6 months or sooner if needed.   Control of Dust Mite Allergen Dust mites play a major role in allergic asthma and rhinitis. They occur in environments with high humidity wherever human skin is found. Dust mites absorb humidity from the atmosphere (ie, they do not drink) and feed on organic matter (including shed human and animal skin). Dust mites are a microscopic type of insect that you cannot see with the naked eye. High levels of dust mites have been detected from mattresses, pillows, carpets, upholstered furniture, bed covers, clothes, soft toys and any woven material. The principal allergen  of the dust mite is found in its feces. A gram of dust may contain 1,000 mites and 250,000 fecal particles. Mite antigen is easily measured in the air during house cleaning activities. Dust mites do not bite and do not cause harm to humans, other than by triggering allergies/asthma.  Ways to decrease your exposure to dust mites in your home:  1. Encase mattresses, box springs and pillows with a mite-impermeable barrier or cover  2. Wash sheets, blankets and drapes weekly in hot water (130 F) with detergent and dry them in a dryer on the hot setting.  3. Have the room cleaned frequently with a vacuum cleaner and a damp dust-mop. For carpeting or rugs, vacuuming with a vacuum cleaner equipped with a high-efficiency particulate air (HEPA) filter. The dust mite allergic individual should not be in a room which is being cleaned and should wait 1 hour after cleaning before going into the room.  4. Do not sleep on upholstered furniture (eg, couches).  5. If possible removing carpeting, upholstered furniture and drapery from the home is ideal. Horizontal blinds should be eliminated in the rooms where the person spends the most time (bedroom, study, television room). Washable vinyl, roller-type shades are optimal.  6. Remove all non-washable stuffed toys from the bedroom. Wash stuffed toys weekly like sheets and blankets above.  7. Reduce indoor humidity to less than 50%. Inexpensive humidity monitors can be purchased at most hardware stores. Do not use a humidifier as can make the problem worse and are not recommended.

## 2022-07-03 ENCOUNTER — Other Ambulatory Visit: Payer: Self-pay

## 2022-07-03 ENCOUNTER — Ambulatory Visit (INDEPENDENT_AMBULATORY_CARE_PROVIDER_SITE_OTHER): Payer: Medicare Other | Admitting: Family Medicine

## 2022-07-03 ENCOUNTER — Encounter: Payer: Self-pay | Admitting: Family Medicine

## 2022-07-03 ENCOUNTER — Other Ambulatory Visit (HOSPITAL_BASED_OUTPATIENT_CLINIC_OR_DEPARTMENT_OTHER): Payer: Self-pay

## 2022-07-03 VITALS — BP 130/70 | HR 71 | Temp 98.2°F | Resp 18 | Ht 61.0 in | Wt 234.8 lb

## 2022-07-03 DIAGNOSIS — K219 Gastro-esophageal reflux disease without esophagitis: Secondary | ICD-10-CM

## 2022-07-03 DIAGNOSIS — T7800XD Anaphylactic reaction due to unspecified food, subsequent encounter: Secondary | ICD-10-CM

## 2022-07-03 DIAGNOSIS — J4540 Moderate persistent asthma, uncomplicated: Secondary | ICD-10-CM

## 2022-07-03 DIAGNOSIS — J310 Chronic rhinitis: Secondary | ICD-10-CM | POA: Diagnosis not present

## 2022-07-03 MED ORDER — EPINEPHRINE 0.3 MG/0.3ML IJ SOAJ
INTRAMUSCULAR | 3 refills | Status: DC
  Filled 2022-07-03: qty 2, fill #0

## 2022-09-04 ENCOUNTER — Other Ambulatory Visit (HOSPITAL_BASED_OUTPATIENT_CLINIC_OR_DEPARTMENT_OTHER): Payer: Self-pay

## 2022-09-04 DIAGNOSIS — M47816 Spondylosis without myelopathy or radiculopathy, lumbar region: Secondary | ICD-10-CM | POA: Diagnosis not present

## 2022-09-04 DIAGNOSIS — M5416 Radiculopathy, lumbar region: Secondary | ICD-10-CM | POA: Diagnosis not present

## 2022-09-04 DIAGNOSIS — M5136 Other intervertebral disc degeneration, lumbar region: Secondary | ICD-10-CM | POA: Diagnosis not present

## 2022-09-04 MED ORDER — TRAMADOL HCL 50 MG PO TABS
50.0000 mg | ORAL_TABLET | Freq: Three times a day (TID) | ORAL | 0 refills | Status: DC | PRN
  Filled 2022-09-04: qty 21, 7d supply, fill #0
  Filled 2022-12-10: qty 9, 3d supply, fill #1

## 2022-09-06 ENCOUNTER — Other Ambulatory Visit (HOSPITAL_BASED_OUTPATIENT_CLINIC_OR_DEPARTMENT_OTHER): Payer: Self-pay

## 2022-10-04 ENCOUNTER — Encounter: Payer: Self-pay | Admitting: Oncology

## 2022-10-04 ENCOUNTER — Other Ambulatory Visit (HOSPITAL_BASED_OUTPATIENT_CLINIC_OR_DEPARTMENT_OTHER): Payer: Self-pay

## 2022-10-04 DIAGNOSIS — I8310 Varicose veins of unspecified lower extremity with inflammation: Secondary | ICD-10-CM | POA: Diagnosis not present

## 2022-10-04 MED ORDER — BETAMETHASONE DIPROPIONATE 0.05 % EX OINT
TOPICAL_OINTMENT | CUTANEOUS | 0 refills | Status: DC
  Filled 2022-10-04: qty 90, 30d supply, fill #0

## 2022-10-04 MED ORDER — BETAMETHASONE DIPROPIONATE 0.05 % EX CREA
1.0000 | TOPICAL_CREAM | Freq: Every day | CUTANEOUS | 0 refills | Status: DC | PRN
  Filled 2022-10-04: qty 100, 14d supply, fill #0

## 2022-10-05 ENCOUNTER — Other Ambulatory Visit (HOSPITAL_BASED_OUTPATIENT_CLINIC_OR_DEPARTMENT_OTHER): Payer: Self-pay

## 2022-10-08 ENCOUNTER — Other Ambulatory Visit: Payer: Self-pay | Admitting: Family Medicine

## 2022-10-08 ENCOUNTER — Other Ambulatory Visit (HOSPITAL_BASED_OUTPATIENT_CLINIC_OR_DEPARTMENT_OTHER): Payer: Self-pay

## 2022-10-08 ENCOUNTER — Encounter: Payer: Self-pay | Admitting: Family Medicine

## 2022-10-08 ENCOUNTER — Ambulatory Visit (INDEPENDENT_AMBULATORY_CARE_PROVIDER_SITE_OTHER): Payer: Medicare Other | Admitting: Family Medicine

## 2022-10-08 VITALS — BP 121/80 | HR 87 | Temp 98.1°F | Ht 62.5 in | Wt 230.0 lb

## 2022-10-08 DIAGNOSIS — B353 Tinea pedis: Secondary | ICD-10-CM | POA: Diagnosis not present

## 2022-10-08 DIAGNOSIS — M79652 Pain in left thigh: Secondary | ICD-10-CM | POA: Diagnosis not present

## 2022-10-08 DIAGNOSIS — Z0000 Encounter for general adult medical examination without abnormal findings: Secondary | ICD-10-CM

## 2022-10-08 DIAGNOSIS — E1169 Type 2 diabetes mellitus with other specified complication: Secondary | ICD-10-CM | POA: Diagnosis not present

## 2022-10-08 DIAGNOSIS — R799 Abnormal finding of blood chemistry, unspecified: Secondary | ICD-10-CM

## 2022-10-08 DIAGNOSIS — E669 Obesity, unspecified: Secondary | ICD-10-CM

## 2022-10-08 LAB — MICROALBUMIN / CREATININE URINE RATIO
Creatinine,U: 19.9 mg/dL
Microalb Creat Ratio: 3.5 mg/g (ref 0.0–30.0)
Microalb, Ur: <0.7 mg/dL (ref 0.0–1.9)

## 2022-10-08 LAB — COMPREHENSIVE METABOLIC PANEL
ALT: 15 U/L (ref 0–35)
AST: 14 U/L (ref 0–37)
Albumin: 3.9 g/dL (ref 3.5–5.2)
Alkaline Phosphatase: 117 U/L (ref 39–117)
BUN: 10 mg/dL (ref 6–23)
CO2: 30 mEq/L (ref 19–32)
Calcium: 9.3 mg/dL (ref 8.4–10.5)
Chloride: 104 mEq/L (ref 96–112)
Creatinine, Ser: 0.8 mg/dL (ref 0.40–1.20)
GFR: 74.46 mL/min (ref >60.00)
Glucose, Bld: 133 mg/dL — ABNORMAL HIGH (ref 70–99)
Potassium: 4 mEq/L (ref 3.5–5.1)
Sodium: 142 mEq/L (ref 135–145)
Total Bilirubin: 0.4 mg/dL (ref 0.2–1.2)
Total Protein: 7 g/dL (ref 6.0–8.3)

## 2022-10-08 LAB — CBC
HCT: 41.3 % (ref 36.0–46.0)
Hemoglobin: 12.9 g/dL (ref 12.0–15.0)
MCHC: 31.3 g/dL (ref 30.0–36.0)
MCV: 68.3 fl — ABNORMAL LOW (ref 78.0–100.0)
Platelets: 203 10*3/uL (ref 150.0–400.0)
RBC: 6.04 Mil/uL — ABNORMAL HIGH (ref 3.87–5.11)
RDW: 15.6 % — ABNORMAL HIGH (ref 11.5–15.5)
WBC: 4.5 10*3/uL (ref 4.0–10.5)

## 2022-10-08 LAB — HEMOGLOBIN A1C: Hgb A1c MFr Bld: 6.9 % — ABNORMAL HIGH (ref 4.6–6.5)

## 2022-10-08 LAB — LIPID PANEL
Cholesterol: 219 mg/dL — ABNORMAL HIGH (ref 0–200)
HDL: 65.3 mg/dL (ref >39.00)
LDL Cholesterol: 135 mg/dL — ABNORMAL HIGH (ref 0–99)
NonHDL: 153.76
Total CHOL/HDL Ratio: 3
Triglycerides: 92 mg/dL (ref 0.0–149.0)
VLDL: 18.4 mg/dL (ref 0.0–40.0)

## 2022-10-08 MED ORDER — KETOCONAZOLE 2 % EX CREA
1.0000 | TOPICAL_CREAM | Freq: Every day | CUTANEOUS | 0 refills | Status: AC
  Filled 2022-10-08: qty 30, 30d supply, fill #0

## 2022-10-08 NOTE — Patient Instructions (Addendum)
Give [place] 2-3 business days to get the results of your labs back.   Keep the diet clean and stay active.  OK to take Tylenol 1000 mg (2 extra strength tabs) or 975 mg (3 regular strength tabs) every 6 hours as needed.  Heat (pad or rice pillow in microwave) over affected area, 10-15 minutes twice daily.   Ice/cold pack over area for 10-15 min twice daily.  Send me a message in 3-4 weeks if no better on R buttock/thigh area.   Let us know if you need anything.  Gluteus Medius Syndrome Rehab It is normal to feel mild stretching, pulling, tightness, or discomfort as you do these exercises, but you should stop right away if you feel sudden pain or your pain gets worse.   Stretching and range of motion exercise This exercise warms up your muscles and joints and improves the movement and flexibility of your hip and pelvis. This exercise also helps to relieve pain and stiffness. Exercise A: Lunge (hip flexor stretch)      Kneel on the floor on your left / right knee. Bend your other knee so it is directly over your ankle. Keep good posture with your head over your shoulders. Tuck your tailbone underneath you. This will prevent your back from arching too much. You should feel a gentle stretch in the front of your thigh or hip. If you do not feel a stretch, slowly lunge forward with your chest up. Hold this position for 30 seconds. Slowly return to the starting position. Repeat 2 times. Complete this exercise 3 times per week. Strengthening exercises These exercises build strength and endurance in your hip and pelvis. Endurance is the ability to use your muscles for a long time, even after they get tired. Exercise B: Bridge (hip extensors)     Lie on your back on a firm surface with your knees bent and your feet flat on the floor. Tighten your buttocks muscles and lift your bottom off the floor until the trunk of your body is level with your thighs. You should feel the muscles working in your  buttocks and the back of your thighs. If this exercise is too easy, cross your arms over your chest or lift one leg while your bottom is up off the floor. Do not arch your back. Hold this position for 3 seconds. Slowly lower your hips to the starting position. Let your muscles relax completely between repetitions. Repeat 2 times. Complete this exercise 3 times per week. Exercise C: Straight leg raises (hip abductors)     Lie on your side with your left / right leg in the top position. Lie so your head, shoulder, knee, and hip line up. Bend your bottom knee to help you balance. Lift your top leg up 4-6 inches (10-15 cm), keeping your toes pointed straight ahead. Hold this position for 2 seconds. Slowly lower your leg to the starting position and let your muscles relax completely. Repeat for a total of 10 repetitions. Repeat 2 times. Complete this exercise 3 times per week. Exercise D: Hip abductors and external rotators, quadruped Get on your hands and knees on a firm, lightly padded surface. Your hands should be directly below your shoulders, and your knees should be directly below your hips. Lift your left / right knee out to the side. Keep your knee bent. Do not twist your body. Hold this position for 3 seconds. Slowly lower your leg. Repeat for a total of 10 repetitions.  Repeat 2  times. Complete this exercise 3 times per week. Exercise E: Single leg stand Stand near a counter or door frame to hold onto as needed. It is helpful to look in a mirror for this exercise so you can watch your hip. Squeeze your left / right buttock muscles then lift up your other foot. Do not let your left / right hip push out to the side. Hold this position for 3 seconds. Repeat for a total of 10 repetitions. Repeat 2 times. Complete this exercise 3 times per week. Make sure you discuss any questions you have with your health care provider. Document Released: 07/09/2005 Document Revised: 03/15/2016 Document  Reviewed: 06/21/2015 Elsevier Interactive Patient Education  2018 [place] Band Syndrome Rehab It is normal to feel mild stretching, pulling, tightness, or discomfort as you do these exercises, but you should stop right away if you feel sudden pain or your pain gets worse.  Stretching and range of motion exercises These exercises warm up your muscles and joints and improve the movement and flexibility of your hip and pelvis. Exercise A: Quadriceps, prone    Lie on your abdomen on a firm surface, such as a bed or padded floor. Bend your left / right knee and hold your ankle. If you cannot reach your ankle or pant leg, loop a belt around your foot and grab the belt instead. Gently pull your heel toward your buttocks. Your knee should not slide out to the side. You should feel a stretch in the front of your thigh and knee. Hold this position for 30 seconds. Repeat 2 times. Complete this stretch 3 times per week. Exercise B: Iliotibial band    Lie on your side with your left / right leg in the top position. Bend both of your knees and grab your left / right ankle. Stretch out your bottom arm to help you balance. Slowly bring your top knee back so your thigh goes behind your trunk. Slowly lower your top leg toward the floor until you feel a gentle stretch on the outside of your left / right hip and thigh. If you do not feel a stretch and your knee will not fall farther, place the heel of your other foot on top of your knee and pull your knee down toward the floor with your foot. Hold this position for 30 seconds. Repeat 2 times. Complete this stretch 3 times per week. Strengthening exercises These exercises build strength and endurance in your hip and pelvis. Endurance is the ability to use your muscles for a long time, even after they get tired. Exercise C: Straight leg raises (hip abductors)     Lie on your side with your left / right leg in the top position. Lie so your  head, shoulder, knee, and hip line up. You may bend your bottom knee to help you balance. Roll your hips slightly forward so your hips are stacked directly over each other and your left / right knee is facing forward. Tense the muscles in your outer thigh and lift your top leg 4-6 inches (10-15 cm). Hold this position for 3 seconds. Repeat for a total of 10 reps. Slowly return to the starting position. Let your muscles relax completely before doing another repetition. Repeat 2 times. Complete this exercise 3 times per week. Exercise D: Straight leg raises (hip extensors) Lie on your abdomen on your bed or a firm surface. You can put a pillow under your hips if that is more comfortable. [employer]  your left / right knee so your foot is straight up in the air. Squeeze your buttock muscles and lift your left / right thigh off the bed. Do not let your back arch. Tense this muscle as hard as you can without increasing any knee pain. Hold this position for 2 seconds. Repeat for a total of 10 reps Slowly lower your leg to the starting position and allow it to relax completely. Repeat 2 times. Complete this exercise 3 times per week. Exercise E: Hip hike Stand sideways on a bottom step. Stand on your left / right leg with your other foot unsupported next to the step. You can hold onto the railing or wall if needed for balance. Keep your knees straight and your torso square. Then, lift your left / right hip up toward the ceiling. Slowly let your left / right hip lower toward the floor, past the starting position. Your foot should get closer to the floor. Do not lean or bend your knees. Repeat 2 times. Complete this exercise 3 times per week.  Document Released: 07/09/2005 Document Revised: 03/13/2016 Document Reviewed: 06/10/2015 Elsevier Interactive Patient Education  [employer].

## 2022-10-08 NOTE — Progress Notes (Signed)
Chief Complaint  Patient presents with   Annual Exam    Left leg pain      Well Woman Katelyn Fisher is here for a complete physical.   Her last physical was >1 year ago.  Current diet: in general, a "healthy" diet. Current exercise: walking, aerobic exercise. Weight is down a few lbs intentionally and she denies daytime fatigue. Seatbelt? Yes Advanced directive? Yes  Health Maintenance Colonoscopy- Yes Shingrix- Yes Lung cancer screening- Yes DEXA- Yes Mammogram- Yes Tetanus- Yes Pneumonia- Yes Hep C screen- Yes  1 mo ago started having pain in L buttock region radiating down thigh. Started after an aerobic exercise session, no specific injury or trigger. No neuro s/s's. Has not fallen. Tried topical NSAID w temporary relief. No bruising, redness, swelling.   Past Medical History:  Diagnosis Date   Allergy    SEASONAL   Anxiety    Arthritis    back   Asthma    Depression    Endometrial cancer ([place]) 2015   Family history of breast cancer    Family history of colon cancer    Family history of ovarian cancer    GERD (gastroesophageal reflux disease)    History of colonic diverticulitis    Hx of colonic polyps    Hyperlipidemia    Hypertension    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   Osteopenia 09/2017   T score -1.2 FRAX 3.2% / 0.3%   Personal history of chemotherapy 2015   Radiation 12/17/14-01/03/15   vaginal brachytherapy   Thyroid disease 06/01/2013     Past Surgical History:  Procedure Laterality Date   ABDOMINAL HYSTERECTOMY  06/23/2014   [facility], TRH/BSO   CHOLECYSTECTOMY  1991   COLONOSCOPY     DILATION AND CURETTAGE OF UTERUS     IR REMOVAL TUN ACCESS W/ PORT W/O FL MOD SED  04/30/2017   POLYPECTOMY     TUBAL LIGATION      Medications  Current Outpatient Medications on File Prior to Visit  Medication Sig Dispense Refill   albuterol (PROAIR HFA) 108 (90 Base) MCG/ACT inhaler inhale 2  puffs by mouth every 4 hours as needed for coughing or  wheezing spells 8.5 g 1   albuterol (PROVENTIL) (2.5 MG/3ML) 0.083% nebulizer solution Take 3 mLs (2.5 mg total) by nebulization every 4 (four) hours as needed for wheezing or shortness of breath. 150 mL 2   amLODipine (NORVASC) 10 MG tablet TAKE 1 TABLET BY MOUTH DAILY 100 tablet 2   atorvastatin (LIPITOR) 40 MG tablet TAKE 1 TABLET BY MOUTH DAILY 100 tablet 2   benazepril (LOTENSIN) 20 MG tablet TAKE 1 TABLET BY MOUTH AT  BEDTIME 100 tablet 2   betamethasone dipropionate (DIPROLENE) 0.05 % ointment 1 application Externally Once a day     betamethasone dipropionate (DIPROLENE) 0.05 % ointment Apply 1 application externally once a day as needed for 30 days. 90 g 0   budesonide-formoterol (SYMBICORT) 160-4.5 MCG/ACT inhaler Inhale 2 puffs into the lungs as needed. 10.2 g 5   carvedilol (COREG) 12.5 MG tablet TAKE 1 TABLET BY MOUTH TWICE  DAILY WITH A MEAL 200 tablet 2   EPINEPHrine (EPIPEN 2-PAK) 0.3 mg/0.3 mL IJ SOAJ injection Use as directed for severe allergic reaction 2 each 3   furosemide (LASIX) 20 MG tablet TAKE 1 TABLET BY MOUTH DAILY 100 tablet 2   ketoconazole (NIZORAL) 2 % cream Apply topically daily.     levocetirizine (XYZAL) 5 MG tablet Take 1 tablet (5 mg  total) by mouth every evening. 90 tablet 2   levothyroxine (SYNTHROID) 75 MCG tablet TAKE 1 TABLET (75 MCG TOTAL) BY MOUTH DAILY BEFORE BREAKFAST. Oral for 30     montelukast (SINGULAIR) 10 MG tablet Take 1 tablet (10 mg total) by mouth at bedtime. 30 tablet 5   Multiple Vitamin (MULTIVITAMIN) tablet Take 1 tablet by mouth daily.     traMADol (ULTRAM) 50 MG tablet Take 1 tablet by mouth 3 times a day as needed. 30 tablet 0   traMADol (ULTRAM) 50 MG tablet Take 1 tablet (50 mg total) by mouth 3 (three) times daily as needed. 30 tablet 0   traMADol (ULTRAM) 50 MG tablet Take 1 tablet (50 mg total) by mouth 3 (three) times daily as needed. 30 tablet 0   Allergies Allergies  Allergen Reactions   Shellfish Allergy Hives, Shortness Of  Breath and Swelling   Oxycodone Swelling    Facial swelling and tightness   Penicillins Swelling    Rash with welps   Iodinated Contrast Media Rash   Sulfa Antibiotics Rash    Review of Systems: Constitutional:  no fevers Eye:  no recent significant change in vision Ears:  No changes in hearing Nose/Mouth/Throat:  no complaints of nasal congestion, no sore throat Cardiovascular: no chest pain Respiratory:  No shortness of breath Gastrointestinal:  No change in bowel habits GU:  Female: negative for dysuria Integumentary:  no abnormal skin lesions reported Neurologic:  no headaches Endocrine:  denies unexplained weight changes  Exam BP 121/80 (BP Location: Left Arm, Patient Position: Sitting, Cuff Size: Large)   Pulse 87   Temp 98.1 F (36.7 C) (Oral)   Ht 5' 2.5" (1.588 m)   Wt 230 lb (104.3 kg)   SpO2 99%   BMI 41.40 kg/m  General:  well developed, well nourished, in no apparent distress Skin:   macerated tissue between 4/5 and 3/4 on L foot and 4/5 on R foot. Otherwise, no significant moles, warts, or growths Head:  no masses, lesions, or tenderness Eyes:  pupils equal and round, sclera anicteric without injection Ears:  canals without lesions, TMs shiny without retraction, no obvious effusion, no erythema Nose:  nares patent, mucosa normal, and no drainage Throat/Pharynx:  lips and gingiva without lesion; tongue and uvula midline; non-inflamed pharynx; no exudates or postnasal drainage, edentulous Neck: neck supple without adenopathy, thyromegaly, or masses Lungs:  clear to auscultation, breath sounds equal bilaterally, no respiratory distress Cardio:  regular rate and rhythm, no bruits or LE edema MSK: No ttp over lumbar spine/msc; mild ttp over distal glute med and IT band.  Abdomen:  abdomen soft, nontender; bowel sounds normal; no masses or organomegaly Genital: Deferred Neuro:  gait normal; deep tendon reflexes normal and symmetric, neg straight leg b/l, 5/5  strength throughout Psych: well oriented with normal range of affect and appropriate judgment/insight  Assessment and Plan  Well adult exam  Diabetes mellitus type 2 in obese ([place]) - Plan: CBC, Comprehensive metabolic panel, Lipid panel, Hemoglobin A1c, Microalbumin / creatinine urine ratio  Tinea pedis of both feet - Plan: ketoconazole (NIZORAL) 2 % cream  Left thigh pain   Well 72 y.o. female. Counseled on diet and exercise. Other orders as above. Tinea pedis: 6 weeks of Nizoral qd. Thigh pain: stretches/exercises for glute med and IT band. Heat, ice, Tylenol, topical NSAID.  Follow up in 6 mo or prn. The patient voiced understanding and agreement to the plan.  [place], DO 10/08/22 7:45 AM

## 2022-10-09 ENCOUNTER — Other Ambulatory Visit: Payer: Self-pay | Admitting: Family Medicine

## 2022-10-09 ENCOUNTER — Other Ambulatory Visit (INDEPENDENT_AMBULATORY_CARE_PROVIDER_SITE_OTHER): Payer: Medicare Other

## 2022-10-09 DIAGNOSIS — R799 Abnormal finding of blood chemistry, unspecified: Secondary | ICD-10-CM

## 2022-10-09 DIAGNOSIS — D72818 Other decreased white blood cell count: Secondary | ICD-10-CM

## 2022-10-09 LAB — IBC PANEL
Iron: 77 ug/dL (ref 42–145)
Saturation Ratios: 21.6 % (ref 20.0–50.0)
TIBC: 357 ug/dL (ref 250.0–450.0)
Transferrin: 255 mg/dL (ref 212.0–360.0)

## 2022-10-26 ENCOUNTER — Encounter: Payer: Self-pay | Admitting: Family Medicine

## 2022-10-29 ENCOUNTER — Other Ambulatory Visit (HOSPITAL_BASED_OUTPATIENT_CLINIC_OR_DEPARTMENT_OTHER): Payer: Self-pay

## 2022-10-29 ENCOUNTER — Encounter: Payer: Self-pay | Admitting: Family Medicine

## 2022-10-29 ENCOUNTER — Other Ambulatory Visit: Payer: Self-pay | Admitting: Family Medicine

## 2022-10-29 ENCOUNTER — Ambulatory Visit (INDEPENDENT_AMBULATORY_CARE_PROVIDER_SITE_OTHER): Payer: Medicare Other | Admitting: Family Medicine

## 2022-10-29 ENCOUNTER — Other Ambulatory Visit (HOSPITAL_BASED_OUTPATIENT_CLINIC_OR_DEPARTMENT_OTHER): Payer: Self-pay | Admitting: Family Medicine

## 2022-10-29 VITALS — BP 130/85 | HR 96 | Temp 98.2°F | Ht 62.0 in | Wt 229.0 lb

## 2022-10-29 DIAGNOSIS — M25552 Pain in left hip: Secondary | ICD-10-CM | POA: Diagnosis not present

## 2022-10-29 DIAGNOSIS — Z1231 Encounter for screening mammogram for malignant neoplasm of breast: Secondary | ICD-10-CM

## 2022-10-29 MED ORDER — MONTELUKAST SODIUM 10 MG PO TABS
10.0000 mg | ORAL_TABLET | Freq: Every day | ORAL | 5 refills | Status: DC
  Filled 2022-10-29: qty 30, 30d supply, fill #0
  Filled 2022-12-10: qty 30, 30d supply, fill #1
  Filled 2023-06-03: qty 30, 30d supply, fill #2
  Filled 2023-07-30: qty 30, 30d supply, fill #3
  Filled 2023-08-28: qty 30, 30d supply, fill #4
  Filled 2023-09-25: qty 30, 30d supply, fill #5

## 2022-10-29 MED ORDER — METHYLPREDNISOLONE ACETATE 40 MG/ML IJ SUSP
40.0000 mg | Freq: Once | INTRAMUSCULAR | Status: AC
  Administered 2022-10-29: 40 mg via INTRA_ARTICULAR

## 2022-10-29 NOTE — Addendum Note (Signed)
Addended by: Scharlene Gloss B on: 10/29/2022 10:04 AM   Modules accepted: Orders

## 2022-10-29 NOTE — Patient Instructions (Signed)
Heat (pad or rice pillow in microwave) over affected area, 10-15 minutes twice daily.   Ice/cold pack over area for 10-15 min twice daily.  OK to take Tylenol 1000 mg (2 extra strength tabs) or 975 mg (3 regular strength tabs) every 6 hours as needed.  Continue home stretches.  Let me know if no improvement.

## 2022-10-29 NOTE — Progress Notes (Signed)
Musculoskeletal Exam  Patient: Katelyn Fisher DOB: Aug 29, 1950  DOS: 10/29/2022  SUBJECTIVE:  Chief Complaint:   Chief Complaint  Patient presents with   Leg Pain    Left     Katelyn Fisher is a 72 y.o.  female for evaluation and treatment of L ihp pain.   Onset:  2 weeks ago. No inj or change in activity.  Location: L outer hip Character:  dull when not moving and sharp w certain movements ( like bending over  Progression of issue:  is unchanged Associated symptoms: radiates down L latera thigh and anteriorly Treatment: to date has been rest, ice, acetaminophen, home exercises, and I[place], topical NSAIDs.   Neurovascular symptoms: no  Past Medical History:  Diagnosis Date   Allergy    SEASONAL   Anxiety    Arthritis    back   Asthma    Depression    Endometrial cancer 2015   Family history of breast cancer    Family history of colon cancer    Family history of ovarian cancer    GERD (gastroesophageal reflux disease)    History of colonic diverticulitis    Hx of colonic polyps    Hyperlipidemia    Hypertension    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   Osteopenia 09/2017   T score -1.2 FRAX 3.2% / 0.3%   Personal history of chemotherapy 2015   Radiation 12/17/14-01/03/15   vaginal brachytherapy   Thyroid disease 06/01/2013    Objective: VITAL SIGNS: BP 130/85 (BP Location: Left Arm, Patient Position: Sitting, Cuff Size: Large)   Pulse 96   Temp 98.2 F (36.8 C) (Oral)   Ht 5\' 2"  (1.575 m)   Wt 229 lb (103.9 kg)   SpO2 97%   BMI 41.88 kg/m  Constitutional: Well formed, well developed. No acute distress. Thorax & Lungs: No accessory muscle use Musculoskeletal: L hip.   Normal active range of motion: yes.   Normal passive range of motion: yes Tenderness to palpation: yes over greater troch; mild ttp over anterior thigh Deformity: no Ecchymosis: no Tests positive: none Tests negative: Stinchfield, logroll, FABER, FADIR, Ober's.  Mild pain w resisted hip  abduction Neurologic: Normal sensory function. Antalgic gait, tho steady Psychiatric: Normal mood. Age appropriate judgment and insight. Alert & oriented x 3.     Procedure note: Greater trochanteric bursa injection Verbal consent obtained. The area of interest was palpated and demarcated with an otoscope speculum. It was cleaned with an alcohol swab. Freeze spray was used. A 27 g needle was inserted at a perpendicular angle through the area of interested. The plunger was withdrawn to ensure our placement was not in a vessel. 2 mL of 1% lidocaine without epi and 40 mg of Depomedrol was injected. A bandaid was placed. The patient tolerated the procedure well.  There were no complications noted.   Assessment:  Left hip pain - Plan: PR ARTHROCENTESIS ASPIR&/INJ MAJOR JT/BURSA W/O [place]  Plan: Greater troch bursa injection today. Stretches/exercises, heat, ice, Tylenol. Send message if no better in the next few days.  F/u prn. The patient voiced understanding and agreement to the plan.   Jilda Roche [place], DO 10/29/22  9:55 AM

## 2022-10-31 ENCOUNTER — Other Ambulatory Visit (HOSPITAL_COMMUNITY)
Admission: RE | Admit: 2022-10-31 | Discharge: 2022-10-31 | Disposition: A | Discharge status: Home / Self Care | Payer: Medicare Other | Source: Ambulatory Visit | Attending: Obstetrics & Gynecology | Admitting: Obstetrics & Gynecology

## 2022-10-31 ENCOUNTER — Encounter: Payer: Self-pay | Admitting: Obstetrics & Gynecology

## 2022-10-31 ENCOUNTER — Ambulatory Visit (INDEPENDENT_AMBULATORY_CARE_PROVIDER_SITE_OTHER): Payer: Medicare Other | Admitting: Obstetrics & Gynecology

## 2022-10-31 VITALS — BP 118/80 | HR 72 | Temp 98.0°F | Resp 16 | Ht 61.25 in | Wt 228.0 lb

## 2022-10-31 DIAGNOSIS — Z780 Asymptomatic menopausal state: Secondary | ICD-10-CM | POA: Diagnosis not present

## 2022-10-31 DIAGNOSIS — Z9189 Other specified personal risk factors, not elsewhere classified: Secondary | ICD-10-CM

## 2022-10-31 DIAGNOSIS — Z90710 Acquired absence of both cervix and uterus: Secondary | ICD-10-CM | POA: Diagnosis not present

## 2022-10-31 DIAGNOSIS — C541 Malignant neoplasm of endometrium: Secondary | ICD-10-CM | POA: Diagnosis present

## 2022-10-31 DIAGNOSIS — Z1272 Encounter for screening for malignant neoplasm of vagina: Secondary | ICD-10-CM | POA: Insufficient documentation

## 2022-10-31 DIAGNOSIS — Z01419 Encounter for gynecological examination (general) (routine) without abnormal findings: Secondary | ICD-10-CM | POA: Diagnosis present

## 2022-10-31 DIAGNOSIS — R87610 Atypical squamous cells of undetermined significance on cytologic smear of cervix (ASC-US): Secondary | ICD-10-CM | POA: Insufficient documentation

## 2022-10-31 DIAGNOSIS — R350 Frequency of micturition: Secondary | ICD-10-CM | POA: Diagnosis not present

## 2022-10-31 DIAGNOSIS — M85852 Other specified disorders of bone density and structure, left thigh: Secondary | ICD-10-CM | POA: Diagnosis not present

## 2022-10-31 DIAGNOSIS — Z6841 Body Mass Index (BMI) 40.0 and over, adult: Secondary | ICD-10-CM

## 2022-10-31 DIAGNOSIS — Z90722 Acquired absence of ovaries, bilateral: Secondary | ICD-10-CM

## 2022-10-31 LAB — URINALYSIS, COMPLETE W/RFL CULTURE
Bacteria, UA: NONE SEEN /HPF
Bilirubin Urine: NEGATIVE
Glucose, UA: NEGATIVE
Hgb urine dipstick: NEGATIVE
Hyaline Cast: NONE SEEN /LPF
Ketones, ur: NEGATIVE
Leukocyte Esterase: NEGATIVE
Nitrites, Initial: NEGATIVE
Protein, ur: NEGATIVE
RBC / HPF: NONE SEEN /HPF (ref 0–2)
Specific Gravity, Urine: 1.015 (ref 1.001–1.035)
WBC, UA: NONE SEEN /HPF (ref 0–5)
pH: 7 (ref 5.0–8.0)

## 2022-10-31 LAB — NO CULTURE INDICATED

## 2022-10-31 NOTE — Progress Notes (Signed)
Katelyn Fisher 1950-10-23 [id number]   History:    72 y.o. G2P2L3 1 adopted child.  Married.   RP:  Established patient presenting for annual gyn exam    HPI: Postmenopause, well on no HRT.  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 Had a total hysterectomy with bilateral salpingo-oophorectomy and staging and postop chemotherapy and radiation treatment which finished in 2016.  Released by Gynecologic oncology. Pap 09/2020 second ASCUS/HPV HR, Colpo 10/2020 No dysplasia on Vaginal Bx.  Pap Neg 09/2021.  Pap reflex today. No pelvic pain. Urinary frequency, good amounts and no UTI Sxs otherwise. Breasts normal.  [facility]  Health labs with her family physician. BMI 42.73. Had Back surgery in 2022. BMD Osteopenia -1.5 in 10/2021.  COLONOSCOPY: 11/2021.   Past medical history,surgical history, family history and social history were all reviewed and documented in the EPIC chart.  Gynecologic History No LMP recorded. Patient has had a hysterectomy.  Obstetric History OB History  Gravida Para Term Preterm AB Living  2 2 2     2   SAB IAB Ectopic Multiple Live Births               # Outcome Date GA Lbr Len/2nd Weight Sex Delivery Anes PTL Lv  2 Term           1 Term             Obstetric Comments  1 adopted child     ROS: A ROS was performed and pertinent positives and negatives are included in the history. GENERAL: No fevers or chills. HEENT: No change in vision, no earache, sore throat or sinus congestion. NECK: No pain or stiffness. CARDIOVASCULAR: No chest pain or pressure. No palpitations. PULMONARY: No shortness of breath, cough or wheeze. GASTROINTESTINAL: No abdominal pain, nausea, vomiting or diarrhea, melena or bright red blood per rectum. GENITOURINARY: No urinary frequency, urgency, hesitancy or dysuria. MUSCULOSKELETAL: No joint or muscle pain, no back pain, no recent trauma. DERMATOLOGIC: No rash, no  itching, no lesions. ENDOCRINE: No polyuria, polydipsia, no heat or cold intolerance. No recent change in weight. HEMATOLOGICAL: No anemia or easy bruising or bleeding. NEUROLOGIC: No headache, seizures, numbness, tingling or weakness. PSYCHIATRIC: No depression, no loss of interest in normal activity or change in sleep pattern.     Exam:   BP 118/80   Pulse 72   Temp 98 F (36.7 C) (Oral)   Resp 16   Ht 5' 1.25" (1.556 m)   Wt 228 lb (103.4 kg)   BMI 42.73 kg/m   Body mass index is 42.73 kg/m.  General appearance : Well developed well nourished female. No acute distress HEENT: Eyes: no retinal hemorrhage or exudates,  Neck supple, trachea midline, no carotid bruits, no thyroidmegaly Lungs: Clear to auscultation, no rhonchi or wheezes, or rib retractions  Heart: Regular rate and rhythm, no murmurs or gallops Breast:Examined in sitting and supine position were symmetrical in appearance, no palpable masses or tenderness,  no skin retraction, no nipple inversion, no nipple discharge, no skin discoloration, no axillary or supraclavicular lymphadenopathy Abdomen: no palpable masses or tenderness, no rebound or guarding Extremities: no edema or skin discoloration or tenderness  Pelvic: Vulva: Normal             Vagina: No gross lesions or discharge.  Pap reflex done.  Cervix/Uterus absent  Adnexa  Without masses or tenderness  Anus: Normal  U/A completely Negative  Assessment/Plan:  73 y.o. female for annual exam   1. Encounter for Papanicolaou smear of vagina as part of routine gynecological examination Postmenopause, well on no HRT.  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 Had a total hysterectomy with bilateral salpingo-oophorectomy and staging and postop chemotherapy and radiation treatment which finished in 2016.  Released by Gynecologic oncology. Pap 09/2020 second ASCUS/HPV HR, Colpo 10/2020 No dysplasia on Vaginal Bx.  Pap Neg 09/2021.  Pap  reflex today. No pelvic pain. Urinary frequency, good amounts and no UTI Sxs otherwise. Breasts normal.  [facility]  Health labs with her family physician. BMI 42.73. Had Back surgery in 2022. BMD Osteopenia -1.5 in 10/2021.  COLONOSCOPY: 11/2021. - Cytology - PAP( [place])  2. S/P TAH-BSO  3. 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 Had a total hysterectomy with bilateral salpingo-oophorectomy and staging and postop chemotherapy and radiation treatment which finished in 2016.  Released by Gynecologic oncology. Pap 09/2020 second ASCUS/HPV HR, Colpo 10/2020 No dysplasia on Vaginal Bx.  Pap Neg 09/2021.  Pap reflex today. No pelvic pain.  - Cytology - PAP( [place])  4. ASCUS of cervix with negative high risk HPV - Cytology - PAP( [place])  5. Postmenopause Postmenopause, well on no HRT.   6. Osteopenia of neck of left femur BMD Osteopenia -1.5 in 10/2021.    7. Urinary frequency U/A completely Negative - Urinalysis,Complete w/RFL Culture  8. Other specified personal risk factors, not elsewhere classified  9. Class 3 severe obesity due to excess calories with serious comorbidity and body mass index (BMI) of 40.0 to 44.9 in adult   Genia Del MD, 9:46 AM

## 2022-11-05 ENCOUNTER — Encounter: Payer: Self-pay | Admitting: *Deleted

## 2022-11-05 LAB — CYTOLOGY - PAP: Diagnosis: NEGATIVE

## 2022-11-08 ENCOUNTER — Other Ambulatory Visit (HOSPITAL_BASED_OUTPATIENT_CLINIC_OR_DEPARTMENT_OTHER): Payer: Self-pay

## 2022-11-08 ENCOUNTER — Other Ambulatory Visit: Payer: Self-pay | Admitting: *Deleted

## 2022-11-08 MED ORDER — FLUCONAZOLE 150 MG PO TABS
150.0000 mg | ORAL_TABLET | ORAL | 0 refills | Status: DC
  Filled 2022-11-08: qty 3, 6d supply, fill #0

## 2022-11-09 ENCOUNTER — Other Ambulatory Visit (INDEPENDENT_AMBULATORY_CARE_PROVIDER_SITE_OTHER): Payer: Medicare Other

## 2022-11-09 DIAGNOSIS — R799 Abnormal finding of blood chemistry, unspecified: Secondary | ICD-10-CM

## 2022-11-09 LAB — CBC WITH DIFFERENTIAL/PLATELET
Basophils Relative: 0.8 %
Lymphs Abs: 1976 cells/uL (ref 850–3900)

## 2022-11-09 NOTE — Addendum Note (Signed)
Addended by: Mervin Kung A on: 11/09/2022 07:49 AM   Modules accepted: Orders

## 2022-11-10 LAB — CBC WITH DIFFERENTIAL/PLATELET
Basophils Absolute: 42 cells/uL (ref 0–200)
Eosinophils Absolute: 73 cells/uL (ref 15–500)
Eosinophils Relative: 1.4 %
HCT: 42.5 % (ref 35.0–45.0)
Hemoglobin: 12.4 g/dL (ref 11.7–15.5)
MCHC: 29.2 g/dL — ABNORMAL LOW (ref 32.0–36.0)
Total Lymphocyte: 38 %
WBC: 5.2 10*3/uL (ref 3.8–10.8)

## 2022-11-10 LAB — IRON,TIBC AND FERRITIN PANEL: TIBC: 314 mcg/dL (calc) (ref 250–450)

## 2022-11-12 LAB — CBC WITH DIFFERENTIAL/PLATELET
Absolute Monocytes: 551 cells/uL (ref 200–950)
MCH: 20.7 pg — ABNORMAL LOW (ref 27.0–33.0)
MCV: 71 fL — ABNORMAL LOW (ref 80.0–100.0)
MPV: 12.1 fL (ref 7.5–12.5)
Monocytes Relative: 10.6 %
Neutro Abs: 2558 cells/uL (ref 1500–7800)
Neutrophils Relative %: 49.2 %
Platelets: 204 10*3/uL (ref 140–400)
RBC: 5.99 10*6/uL — ABNORMAL HIGH (ref 3.80–5.10)
RDW: 15.3 % — ABNORMAL HIGH (ref 11.0–15.0)

## 2022-11-12 LAB — PATHOLOGIST SMEAR REVIEW

## 2022-11-12 LAB — IRON,TIBC AND FERRITIN PANEL
%SAT: 20 % (calc) (ref 16–45)
Ferritin: 69 ng/mL (ref 16–288)
Iron: 64 ug/dL (ref 45–160)

## 2022-11-27 ENCOUNTER — Encounter: Payer: Self-pay | Admitting: Family Medicine

## 2022-11-27 ENCOUNTER — Other Ambulatory Visit: Payer: Self-pay | Admitting: Family Medicine

## 2022-11-27 ENCOUNTER — Ambulatory Visit (INDEPENDENT_AMBULATORY_CARE_PROVIDER_SITE_OTHER): Payer: Medicare Other | Admitting: Family Medicine

## 2022-11-27 VITALS — BP 132/86 | HR 92 | Temp 98.1°F | Resp 20

## 2022-11-27 DIAGNOSIS — J4540 Moderate persistent asthma, uncomplicated: Secondary | ICD-10-CM

## 2022-11-27 DIAGNOSIS — J3089 Other allergic rhinitis: Secondary | ICD-10-CM | POA: Diagnosis not present

## 2022-11-27 DIAGNOSIS — K219 Gastro-esophageal reflux disease without esophagitis: Secondary | ICD-10-CM

## 2022-11-27 DIAGNOSIS — T7800XD Anaphylactic reaction due to unspecified food, subsequent encounter: Secondary | ICD-10-CM

## 2022-11-27 NOTE — Progress Notes (Signed)
[address] [place] [address] Dept: [phone]  FOLLOW UP NOTE  Patient ID: Katelyn Fisher, female    DOB: 1950-09-11  Age: 72 y.o. MRN: [id number] Date of Office Visit: 11/27/2022  Assessment  Chief Complaint: Asthma (Coughing since last Wednesday. She has been walking outside.)  HPI Katelyn Fisher is a 72 year old female who presents to the clinic for follow-up visit.  She was last seen in this clinic on 07/03/2022 by Thermon Leyland, FNP, for evaluation of asthma, allergic rhinitis, reflux, and food allergy to shellfish.    At today's visit, she reports that on Tuesday and Wednesday she walked outside and spent a good deal of time on her porch after which she developed slight wheeze occurring mainly at night and cough producing thin clear mucus.  She denies shortness of breath.  She continues montelukast 10 mg once a day, Symbicort 160-2 puffs once a day, and has been using albuterol once or twice a day over the last 6 days.  She denies fever, sweats, chills, or sick contacts.  Allergic rhinitis is reported as poorly controlled with symptoms including clear rhinorrhea, nasal congestion, occasional sneeze, and intermittent headache.  She continues cetirizine as needed and is not currently using Flonase or nasal saline rinses.  Her last environmental allergy skin testing was on 05/25/2020 and was positive to dust mite.  Reflux is reported as well-controlled with no medical intervention at this time.  She continues to avoid shellfish with no accidental ingestion or EpiPen use since her last visit to this clinic.  Her epinephrine autoinjector set is up-to-date.  Her current medications are listed in the chart.  Drug Allergies:  Allergies  Allergen Reactions   Shellfish Allergy Hives, Shortness Of Breath and Swelling   Oxycodone Swelling    Facial swelling and tightness   Penicillins Swelling    Rash with welps   Iodinated Contrast Media Rash   Sulfa Antibiotics Rash     Physical Exam: BP (!) 140/84 (BP Location: Left Arm, Patient Position: Sitting, Cuff Size: Large)   Pulse 92   Temp 98.1 F (36.7 C) (Temporal)   Resp 20   SpO2 99%    Physical Exam Vitals reviewed.  Constitutional:      Appearance: Normal appearance.  HENT:     Head: Normocephalic and atraumatic.     Right Ear: Tympanic membrane normal.     Left Ear: Tympanic membrane normal.     Nose:     Comments: Bilateral nares edematous and pale with thin clear nasal drainage noted.  Pharynx slightly erythematous with no exudate.  Ears normal.  Eyes normal. Eyes:     Conjunctiva/sclera: Conjunctivae normal.  Cardiovascular:     Rate and Rhythm: Normal rate and regular rhythm.     Heart sounds: Normal heart sounds. No murmur heard. Pulmonary:     Effort: Pulmonary effort is normal.     Breath sounds: Normal breath sounds.     Comments: Lungs clear to auscultation Musculoskeletal:        General: Normal range of motion.     Cervical back: Normal range of motion and neck supple.  Skin:    General: Skin is warm and dry.  Neurological:     Mental Status: She is alert and oriented to person, place, and time.  Psychiatric:        Mood and Affect: Mood normal.        Behavior: Behavior normal.  Thought Content: Thought content normal.        Judgment: Judgment normal.     Diagnostics: FVC 1.63 which is 75% of predicted value, FEV1 1.33 which is 78% of predicted value.  Spirometry indicates normal ventilatory function.  Assessment and Plan: 1. Moderate persistent asthma without complication   2. Perennial allergic rhinitis   3. Gastroesophageal reflux disease without esophagitis   4. Anaphylactic shock due to food, subsequent encounter     No orders of the defined types were placed in this encounter.   Patient Instructions  Asthma Increase Symbicort 160 to 2 puffs twice a day with a spacer for the next 2 weeks and then decrease back down to Symbicort 160-2 puffs once  a day  Continue montelukast (Singulair) 10 mg once a day to prevent cough or wheeze Continue albuterol 2 puffs every 4 hours as needed for cough or wheeze OR Instead use albuterol 0.083% solution via nebulizer one unit vial every 4 hours as needed for cough or wheeze  If no improvement in your cough we will consider chest xray  Allergic rhinitis Continue allergen avoidance measures directed toward dust mites as listed below Begin flonase nasal spray 1-2 sprays each nostril once a day as needed for stuffy nose.  In the right nostril, point the applicator out toward the right ear. In the left nostril, point the applicator out toward the left ear Continue levocetirizine 5 mg one tablet once a day as needed for runny nose or itching May use saline nasal rinses as needed For thick post nasal drainage, begin Mucinex [phone] mg twice a day for about 1 week Consider updating your environmental allergy testing.  Remember to stop antihistamines for 3 days before your skin testing appointment.  Reflux Continue dietary and lifestyle modifications as listed below  Food allergy Continue to avoid shellfish.  In case of an allergic reaction, give Benadryl 50 mg  every 4 hours, and if life-threatening symptoms occur, inject with EpiPen 0.3 mg. Return to the clinic if you are interested in updating your food allergy testing.  Remember to stop antihistamines for 3 days before your food testing appointment.  Call the clinic if this treatment plan is not working well for you  Follow up in 2 months or sooner if needed.   No follow-ups on file.    Thank you for the opportunity to care for this patient.  Please do not hesitate to contact me with questions.  Thermon Leyland, FNP [facility]

## 2022-11-27 NOTE — Patient Instructions (Addendum)
Asthma Increase Symbicort 160 to 2 puffs twice a day with a spacer for the next 2 weeks and then decrease back down to Symbicort 160-2 puffs once a day  Continue montelukast (Singulair) 10 mg once a day to prevent cough or wheeze Continue albuterol 2 puffs every 4 hours as needed for cough or wheeze OR Instead use albuterol 0.083% solution via nebulizer one unit vial every 4 hours as needed for cough or wheeze  If no improvement in your cough we will consider chest xray  Allergic rhinitis Continue allergen avoidance measures directed toward dust mites as listed below Begin flonase nasal spray 1-2 sprays each nostril once a day as needed for stuffy nose.  In the right nostril, point the applicator out toward the right ear. In the left nostril, point the applicator out toward the left ear Continue levocetirizine 5 mg one tablet once a day as needed for runny nose or itching May use saline nasal rinses as needed For thick post nasal drainage, begin Mucinex [phone] mg twice a day for about 1 week Consider updating your environmental allergy testing.  Remember to stop antihistamines for 3 days before your skin testing appointment.  Reflux Continue dietary and lifestyle modifications as listed below  Food allergy Continue to avoid shellfish.  In case of an allergic reaction, give Benadryl 50 mg  every 4 hours, and if life-threatening symptoms occur, inject with EpiPen 0.3 mg. Return to the clinic if you are interested in updating your food allergy testing.  Remember to stop antihistamines for 3 days before your food testing appointment.  Call the clinic if this treatment plan is not working well for you  Follow up in 2 months or sooner if needed.   Control of Dust Mite Allergen Dust mites play a major role in allergic asthma and rhinitis. They occur in environments with high humidity wherever human skin is found. Dust mites absorb humidity from the atmosphere (ie, they do not drink) and feed on  organic matter (including shed human and animal skin). Dust mites are a microscopic type of insect that you cannot see with the naked eye. High levels of dust mites have been detected from mattresses, pillows, carpets, upholstered furniture, bed covers, clothes, soft toys and any woven material. The principal allergen of the dust mite is found in its feces. A gram of dust may contain 1,000 mites and 250,000 fecal particles. Mite antigen is easily measured in the air during house cleaning activities. Dust mites do not bite and do not cause harm to humans, other than by triggering allergies/asthma.  Ways to decrease your exposure to dust mites in your home:  1. Encase mattresses, box springs and pillows with a mite-impermeable barrier or cover  2. Wash sheets, blankets and drapes weekly in hot water (130 F) with detergent and dry them in a dryer on the hot setting.  3. Have the room cleaned frequently with a vacuum cleaner and a damp dust-mop. For carpeting or rugs, vacuuming with a vacuum cleaner equipped with a high-efficiency particulate air (HEPA) filter. The dust mite allergic individual should not be in a room which is being cleaned and should wait 1 hour after cleaning before going into the room.  4. Do not sleep on upholstered furniture (eg, couches).  5. If possible removing carpeting, upholstered furniture and drapery from the home is ideal. Horizontal blinds should be eliminated in the rooms where the person spends the most time (bedroom, study, television room). Washable vinyl, roller-type shades are  optimal.  6. Remove all non-washable stuffed toys from the bedroom. Wash stuffed toys weekly like sheets and blankets above.  7. Reduce indoor humidity to less than 50%. Inexpensive humidity monitors can be purchased at most hardware stores. Do not use a humidifier as can make the problem worse and are not recommended.

## 2022-11-28 ENCOUNTER — Other Ambulatory Visit (HOSPITAL_BASED_OUTPATIENT_CLINIC_OR_DEPARTMENT_OTHER): Payer: Self-pay

## 2022-11-28 MED ORDER — BUDESONIDE-FORMOTEROL FUMARATE 160-4.5 MCG/ACT IN AERO
2.0000 | INHALATION_SPRAY | RESPIRATORY_TRACT | 5 refills | Status: DC | PRN
  Filled 2022-11-28: qty 10.2, 30d supply, fill #0

## 2022-12-10 ENCOUNTER — Other Ambulatory Visit (HOSPITAL_BASED_OUTPATIENT_CLINIC_OR_DEPARTMENT_OTHER): Payer: Self-pay

## 2022-12-10 ENCOUNTER — Encounter: Payer: Self-pay | Admitting: Family Medicine

## 2022-12-10 ENCOUNTER — Ambulatory Visit (INDEPENDENT_AMBULATORY_CARE_PROVIDER_SITE_OTHER): Payer: Medicare Other | Admitting: Family Medicine

## 2022-12-10 VITALS — BP 120/70 | HR 63 | Temp 98.5°F | Ht 62.0 in | Wt 232.0 lb

## 2022-12-10 DIAGNOSIS — M545 Low back pain, unspecified: Secondary | ICD-10-CM | POA: Diagnosis not present

## 2022-12-10 DIAGNOSIS — M7918 Myalgia, other site: Secondary | ICD-10-CM

## 2022-12-10 NOTE — Progress Notes (Signed)
Musculoskeletal Exam  Patient: Katelyn Fisher DOB: [birth year]  DOS: 12/10/2022  SUBJECTIVE:  Chief Complaint:   Chief Complaint  Patient presents with   Hip Pain    Katelyn Fisher is a 72 y.o.  female for evaluation and treatment of L hip pain.   Onset:  2 months ago. No inj or change in activity.  Location: L outer hip Character:  sharp  Progression of issue:  got better after shot for weeks and then got worse Associated symptoms: worse w movement No bowel/bladder s/s's Treatment: to date has been ice, OTC NSAIDS, acetaminophen, home exercises, and steroid bursa injection.   Neurovascular symptoms: no  Past Medical History:  Diagnosis Date   Allergy    SEASONAL   Anxiety    Arthritis    back   Asthma    Depression    Endometrial cancer (HCC) 2015   Family history of breast cancer    Family history of colon cancer    Family history of ovarian cancer    GERD (gastroesophageal reflux disease)    History of colonic diverticulitis    Hx of colonic polyps    Hyperlipidemia    Hypertension    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   Osteopenia 09/2017   T score -1.2 FRAX 3.2% / 0.3%   Personal history of chemotherapy 2015   Radiation 12/17/14-01/03/15   vaginal brachytherapy   Thyroid disease 06/01/2013    Objective: VITAL SIGNS: BP 120/70 (BP Location: Left Arm, Patient Position: Sitting, Cuff Size: Large)   Pulse 63   Temp 98.5 F (36.9 C) (Oral)   Ht 5\' 2"  (1.575 m)   Wt 232 lb (105.2 kg)   SpO2 98%   BMI 42.43 kg/m  Constitutional: Well formed, well developed. No acute distress. Thorax & Lungs: No accessory muscle use Musculoskeletal: L hip/lumbar.   Tenderness to palpation: Yes over lumbar paraspinal musculature on the left and piriformis region, greater trochanter Deformity: no Ecchymosis: no Tests positive: none  Tests negative: Stinchfield, straight leg, FABER, FADIR Neurologic: Normal sensory function. No focal deficits noted. DTR's equal and  symmetric in LE's. No clonus.  Gait is antalgic. Psychiatric: Normal mood. Age appropriate judgment and insight. Alert & oriented x 3.    Assessment:  Gluteal pain - Plan: Ambulatory referral to Physical Therapy  Left-sided low back pain without sciatica, unspecified chronicity - Plan: Ambulatory referral to Physical Therapy  Plan: She failed injection, heat, ice, Tylenol, stretches and exercises.  Refer to physical therapy.  She has an appointment with Ortho next month. F/u with me as originally scheduled. The patient voiced understanding and agreement to the plan.   Jilda Roche Wade, DO 12/10/22  11:38 AM

## 2022-12-10 NOTE — Patient Instructions (Signed)
Give [place] 2-3 business days to get the results of your labs back.   Heat (pad or rice pillow in microwave) over affected area, 10-15 minutes twice daily.   Ice/cold pack over area for 10-15 min twice daily.  If you do not hear anything about your referral in the next 1-2 weeks, call our office and ask for an update.  Let us know if you need anything.

## 2022-12-17 ENCOUNTER — Ambulatory Visit (HOSPITAL_BASED_OUTPATIENT_CLINIC_OR_DEPARTMENT_OTHER): Payer: Medicare Other

## 2022-12-18 ENCOUNTER — Encounter (HOSPITAL_BASED_OUTPATIENT_CLINIC_OR_DEPARTMENT_OTHER): Payer: Self-pay

## 2022-12-18 ENCOUNTER — Ambulatory Visit (HOSPITAL_BASED_OUTPATIENT_CLINIC_OR_DEPARTMENT_OTHER)
Admission: RE | Admit: 2022-12-18 | Discharge: 2022-12-18 | Disposition: A | Discharge status: Home / Self Care | Payer: Medicare Other | Source: Ambulatory Visit | Attending: Family Medicine | Admitting: Family Medicine

## 2022-12-18 DIAGNOSIS — Z1231 Encounter for screening mammogram for malignant neoplasm of breast: Secondary | ICD-10-CM | POA: Insufficient documentation

## 2022-12-18 NOTE — Patient Instructions (Incomplete)
Asthma Increase Symbicort 160 to 2 puffs twice a day with a spacer for the next 2 weeks and then decrease back down to Symbicort 160-2 puffs once a day  Continue montelukast (Singulair) 10 mg once a day to prevent cough or wheeze Continue albuterol 2 puffs every 4 hours as needed for cough or wheeze OR Instead use albuterol 0.083% solution via nebulizer one unit vial every 4 hours as needed for cough or wheeze  If no improvement in your cough we will consider chest xray  Allergic rhinitis Continue allergen avoidance measures directed toward dust mites as listed below Begin flonase nasal spray 1-2 sprays each nostril once a day as needed for stuffy nose.  In the right nostril, point the applicator out toward the right ear. In the left nostril, point the applicator out toward the left ear Continue levocetirizine 5 mg one tablet once a day as needed for runny nose or itching May use saline nasal rinses as needed For thick post nasal drainage, begin Mucinex [phone] mg twice a day for about 1 week Consider updating your environmental allergy testing.  Remember to stop antihistamines for 3 days before your skin testing appointment.  Reflux Continue dietary and lifestyle modifications as listed below  Food allergy Continue to avoid shellfish.  In case of an allergic reaction, give Benadryl 50 mg  every 4 hours, and if life-threatening symptoms occur, inject with EpiPen 0.3 mg. Return to the clinic if you are interested in updating your food allergy testing.  Remember to stop antihistamines for 3 days before your food testing appointment.  Call the clinic if this treatment plan is not working well for you  Follow up in 2 months or sooner if needed.   Control of Dust Mite Allergen Dust mites play a major role in allergic asthma and rhinitis. They occur in environments with high humidity wherever human skin is found. Dust mites absorb humidity from the atmosphere (ie, they do not drink) and feed on  organic matter (including shed human and animal skin). Dust mites are a microscopic type of insect that you cannot see with the naked eye. High levels of dust mites have been detected from mattresses, pillows, carpets, upholstered furniture, bed covers, clothes, soft toys and any woven material. The principal allergen of the dust mite is found in its feces. A gram of dust may contain 1,000 mites and 250,000 fecal particles. Mite antigen is easily measured in the air during house cleaning activities. Dust mites do not bite and do not cause harm to humans, other than by triggering allergies/asthma.  Ways to decrease your exposure to dust mites in your home:  1. Encase mattresses, box springs and pillows with a mite-impermeable barrier or cover  2. Wash sheets, blankets and drapes weekly in hot water (130 F) with detergent and dry them in a dryer on the hot setting.  3. Have the room cleaned frequently with a vacuum cleaner and a damp dust-mop. For carpeting or rugs, vacuuming with a vacuum cleaner equipped with a high-efficiency particulate air (HEPA) filter. The dust mite allergic individual should not be in a room which is being cleaned and should wait 1 hour after cleaning before going into the room.  4. Do not sleep on upholstered furniture (eg, couches).  5. If possible removing carpeting, upholstered furniture and drapery from the home is ideal. Horizontal blinds should be eliminated in the rooms where the person spends the most time (bedroom, study, television room). Washable vinyl, roller-type shades are  optimal.  6. Remove all non-washable stuffed toys from the bedroom. Wash stuffed toys weekly like sheets and blankets above.  7. Reduce indoor humidity to less than 50%. Inexpensive humidity monitors can be purchased at most hardware stores. Do not use a humidifier as can make the problem worse and are not recommended.

## 2022-12-18 NOTE — Progress Notes (Unsigned)
   [address] [place] [address] Dept: [phone]  FOLLOW UP NOTE  Patient ID: Katelyn Fisher, female    DOB: July 10, 1951  Age: 72 y.o. MRN: [id number] Date of Office Visit: 12/19/2022  Assessment  Chief Complaint: No chief complaint on file.  Katelyn Fisher is a 72 year old female who presents to the clinic for acute evaluation of cough.  She was last seen in this clinic on 11/27/2022 by Thermon Leyland, FNP, for evaluation of asthma, allergic rhinitis, reflux, and food allergy to shellfish.  Her last environmental allergy testing was on 05/25/2010 and was positive to dust mite and negative to the remainder of the adult panel.   Drug Allergies:  Allergies  Allergen Reactions   Shellfish Allergy Hives, Shortness Of Breath and Swelling   Oxycodone Swelling    Facial swelling and tightness   Penicillins Swelling    Rash with welps   Iodinated Contrast Media Rash   Sulfa Antibiotics Rash    Physical Exam: There were no vitals taken for this visit.   Physical Exam  Diagnostics:    Assessment and Plan: No diagnosis found.  No orders of the defined types were placed in this encounter.   There are no Patient Instructions on file for this visit.  No follow-ups on file.    Thank you for the opportunity to care for this patient.  Please do not hesitate to contact me with questions.  Thermon Leyland, FNP [facility]

## 2022-12-19 ENCOUNTER — Ambulatory Visit (HOSPITAL_BASED_OUTPATIENT_CLINIC_OR_DEPARTMENT_OTHER)
Admission: RE | Admit: 2022-12-19 | Discharge: 2022-12-19 | Disposition: A | Discharge status: Home / Self Care | Payer: Medicare Other | Source: Ambulatory Visit | Attending: Family Medicine | Admitting: Family Medicine

## 2022-12-19 ENCOUNTER — Ambulatory Visit (INDEPENDENT_AMBULATORY_CARE_PROVIDER_SITE_OTHER): Payer: Medicare Other | Admitting: Family Medicine

## 2022-12-19 ENCOUNTER — Other Ambulatory Visit (HOSPITAL_BASED_OUTPATIENT_CLINIC_OR_DEPARTMENT_OTHER): Payer: Self-pay

## 2022-12-19 ENCOUNTER — Encounter: Payer: Self-pay | Admitting: Family Medicine

## 2022-12-19 VITALS — BP 120/80 | HR 78 | Temp 97.5°F | Resp 16 | Ht 61.0 in

## 2022-12-19 DIAGNOSIS — J4540 Moderate persistent asthma, uncomplicated: Secondary | ICD-10-CM

## 2022-12-19 DIAGNOSIS — T7800XD Anaphylactic reaction due to unspecified food, subsequent encounter: Secondary | ICD-10-CM | POA: Diagnosis not present

## 2022-12-19 DIAGNOSIS — K219 Gastro-esophageal reflux disease without esophagitis: Secondary | ICD-10-CM

## 2022-12-19 DIAGNOSIS — R051 Acute cough: Secondary | ICD-10-CM | POA: Insufficient documentation

## 2022-12-19 DIAGNOSIS — J45909 Unspecified asthma, uncomplicated: Secondary | ICD-10-CM | POA: Diagnosis not present

## 2022-12-19 DIAGNOSIS — J3089 Other allergic rhinitis: Secondary | ICD-10-CM

## 2022-12-19 DIAGNOSIS — R059 Cough, unspecified: Secondary | ICD-10-CM | POA: Diagnosis not present

## 2022-12-19 HISTORY — DX: Acute cough: R05.1

## 2022-12-19 MED ORDER — PREDNISONE 10 MG PO TABS
ORAL_TABLET | ORAL | 0 refills | Status: DC
  Filled 2022-12-19: qty 15, 5d supply, fill #0

## 2022-12-19 NOTE — Addendum Note (Signed)
Addended by: Elsworth Soho on: 12/19/2022 02:20 PM   Modules accepted: Orders

## 2022-12-19 NOTE — Progress Notes (Signed)
Can you please let this patient know that the cxr indicates no acute abnormality. Since she did have a large amount of reversibility on her post bronchodilator spirometry can you please order prednisone 10 mg tablets. Begin prednisone 10 mg tablets. Take 2 tablets twice a day for 3 days, then take 2 tablets once a day for 1 day, then take 1 tablet on the 5th day, then stop. Please have her continue her Symbicort 160-2 puffs twice a day with a spacer and, for the next 1 week, pre-treat with albuterol, then use albuterol as needed. Continue to use nasal saline rinses and Flonase. Please have her call in 1-2 days if still coughing. Thank you

## 2022-12-31 NOTE — Therapy (Signed)
OUTPATIENT PHYSICAL THERAPY THORACOLUMBAR EVALUATION   Patient Name: Katelyn Fisher MRN: [id number] DOB:[birth year], 72 y.o., female Today's Date: 01/02/2023  END OF SESSION:  PT End of Session - 01/02/23 0804     Visit Number 1    Date for PT Re-Evaluation 02/13/23    Authorization Type [employer] & [employer]    Progress Note Due on Visit 10    PT Start Time 0804    PT Stop Time 0857    PT Time Calculation (min) 53 min    Activity Tolerance Patient tolerated treatment well    Behavior During Therapy [facility] for tasks assessed/performed             Past Medical History:  Diagnosis Date   Allergy    SEASONAL   Anxiety    Arthritis    back   Asthma    Depression    Endometrial cancer (HCC) 2015   Family history of breast cancer    Family history of colon cancer    Family history of ovarian cancer    GERD (gastroesophageal reflux disease)    History of colonic diverticulitis    Hx of colonic polyps    Hyperlipidemia    Hypertension    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   Osteopenia 09/2017   T score -1.2 FRAX 3.2% / 0.3%   Personal history of chemotherapy 2015   Radiation 12/17/14-01/03/15   vaginal brachytherapy   Thyroid disease 06/01/2013   Past Surgical History:  Procedure Laterality Date   ABDOMINAL HYSTERECTOMY  06/23/2014   [facility], TRH/BSO   CHOLECYSTECTOMY  1991   COLONOSCOPY     DILATION AND CURETTAGE OF UTERUS     IR REMOVAL TUN ACCESS W/ PORT W/O FL MOD SED  04/30/2017   POLYPECTOMY     TUBAL LIGATION     Patient Active Problem List   Diagnosis Date Noted   Acute cough 12/19/2022   Chronic rhinitis 08/21/2021   Mass of left elbow 09/14/2019   Flank pain 09/14/2019   Subacromial impingement of left shoulder 12/30/2018   Not well controlled moderate persistent asthma 08/07/2018   Current use of beta blocker 06/30/2018   Perennial allergic rhinitis 06/30/2018   Acute bronchitis due to other specified organisms 06/30/2018   History of endometrial  cancer 02/03/2018   Screening Mammogram 11/05/2017   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   Type 2 diabetes mellitus with diabetic neuropathy, without long-term current use of insulin (HCC) 10/25/2016   Microcytosis 09/28/2016   Mild intermittent asthma without complication 01/26/2016   Port catheter in place 11/15/2015   Family history of breast cancer    Family history of ovarian cancer    Family history of prostate cancer    Family history of colon cancer    Morbid obesity (HCC) 11/09/2014   H/O total hysterectomy with bilateral salpingo-oophorectomy (BSO) 06/24/2014   Endometrial cancer (HCC) 05/20/2014   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   Hypothyroidism 06/01/2013   Low back pain radiating to right leg 08/18/2012   Anemia 11/07/2011   Hyperlipidemia 06/15/2011   Diabetes mellitus type 2 in obese 03/12/2011   Acute pain of right knee 01/27/2011   Obstructive sleep apnea 11/15/2010   Osteoarthritis 11/15/2010   Gastroesophageal reflux disease without esophagitis 09/14/2010   DIVERTICULITIS, HX OF 09/14/2010   POSTMENOPAUSAL STATUS 10/01/2008   COLONIC POLYPS, HX OF 03/10/2008   Essential hypertension 08/13/2006    PCP: Sharlene Dory, DO   REFERRING PROVIDER: Sharlene Dory, DO  REFERRING DIAG:  M79.18 (ICD-10-CM) - Gluteal pain M54.50 (ICD-10-CM) - Left-sided low back pain without sciatica, unspecified chronicity  THERAPY DIAG:  Other low back pain  Radiculopathy, lumbar region  Muscle weakness (generalized)  Difficulty in walking, not elsewhere classified  RATIONALE FOR EVALUATION AND TREATMENT: Rehabilitation  ONSET DATE: February 2024  NEXT MD VISIT: 04/10/23   SUBJECTIVE:                                                                                                                                                                                                         SUBJECTIVE STATEMENT: Pt reports L-sided LBP  extending into L LE to thigh and sometimes all the way down her L LE. Onset of pain in February while completing some chair exercises and pain has persisted since, worsening overall but intermittent/variable in nature. She will see her orthopedic MD, Dr. Ethelene Hal, tomorrow. She would like to try everything she can to avoid surgery.  PAIN: Are you having pain? Yes: NPRS scale: 3-4/10 Pain location: L low back & intermittently down L LE Pain description: sharp Aggravating factors: shifting weight onto L LE, carrying things on the L side, bending over, lower body dressing, crossing her legs Relieving factors: 2 wk benefit from injection, Tylenol, Voltaren gel  PERTINENT HISTORY:  Prediabetes/DM-II, thyroid disease, hx of endometrial CA treated with chemo, osteopenia, OA, HTN, HLD, GERD, BPPV, asthma, anxiety, depression, h/o flank pain and LBP with R LE radiculopathy  PRECAUTIONS: None  WEIGHT BEARING RESTRICTIONS: No  FALLS:  Has patient fallen in last 6 months? No  LIVING ENVIRONMENT: Lives with: lives with their spouse Lives in: House/apartment Stairs: Yes: External: 1 steps; on left going up Has following equipment at home: Single point cane and Grab bars  OCCUPATION: Retired  PLOF: Independent and Leisure: read, Clinical cytogeneticist, walking around the house or in the backyard or short distances in the neighborhood  PATIENT GOALS: "To relieve the pain."   OBJECTIVE: (objective measures completed at initial evaluation unless otherwise dated)  DIAGNOSTIC FINDINGS:  10/10/19 - Lumbar MRI IMPRESSION: 1. Facet osteoarthritis at L3-4 and below with L4-5 and L5-S1 anterolisthesis. 2. Variable disc degeneration at L2-3 and below, advanced at L5-S1. 3. Spinal stenosis that is high-grade at L4-5 and moderate at L5-S1. 4. Moderate foraminal narrowing on the left at L4-5 and bilaterally at L5-S1.  PATIENT SURVEYS:  Modified Oswestry 23 / 50 = 46.0 %  LEFS 26 / 80 = 32.5 % FOTO : lumbar spine = 44,  predicted = 55  SCREENING FOR RED FLAGS: Bowel or bladder  incontinence: No Spinal tumors: No Cauda equina syndrome: No Compression fracture: No Abdominal aneurysm: No  COGNITION:  Overall cognitive status: Within functional limits for tasks assessed    SENSATION: WFL Occasional numbness and tingling down L LE  MUSCLE LENGTH: Hamstrings: mild tightness L>R ITB: mod tight R>L Piriformis: mod tight R>L Hip flexors: mild tight B Quads: mild tight B Heelcord: NT  POSTURE:  rounded shoulders, forward head, increased lumbar lordosis, and anterior pelvic tilt  PALPATION: Increased muscle tension and TTP in L>R lumbar paraspinals, glutes, and piriformis  LUMBAR ROM:   Active  Eval *  Flexion Hands to mid shins  Extension 50% limited  Right lateral flexion Hand to lower thigh  Left lateral flexion Hand to lateral knee  Right rotation WFL  Left rotation WFL  (Blank rows = not tested, * = increased pain with all motions on eval except R lateral flexion)  LOWER EXTREMITY ROM:    Mildly limited hip ROM due to body habitus, but otherwise WFL  LOWER EXTREMITY MMT:    MMT Right eval Left eval  Hip flexion 4+ 4  Hip extension 3+ 2+  Hip abduction 4 4-  Hip adduction 4- 3-  Hip internal rotation 5 4  Hip external rotation 4- 3+  Knee flexion 5 5  Knee extension 5 5  Ankle dorsiflexion 4 4  Ankle plantarflexion    Ankle inversion    Ankle eversion     (Blank rows = not tested)  LUMBAR SPECIAL TESTS:  Straight leg raise test: Positive on left   TODAY'S TREATMENT:   01/02/23 THERAPEUTIC EXERCISE: to improve flexibility, strength and mobility.  Demonstration, verbal and tactile cues throughout for technique. Supine L SKTC with pillowcase for extended reach x 30" Hooklying L KTOS piriformis stretch x 30" Hooklying L figure-4 piriformis stretch x 30" Seated L hip hinge HS stretch with foot elevated on trash can x 30"   PATIENT EDUCATION:  Education details: PT eval  findings, anticipated POC, initial HEP, and role of DN Person educated: Patient Education method: Explanation, Demonstration, Tactile cues, Verbal cues, and Handouts Education comprehension: verbalized understanding, returned demonstration, verbal cues required, tactile cues required, and needs further education  HOME EXERCISE PROGRAM: Access Code: [email or web address] URL: [email or web address] Date: 01/02/2023 Prepared by: Glenetta Hew  Exercises - Hooklying Single Knee to Chest Stretch with Towel  - 2 x daily - 7 x weekly - 3 reps - 30 sec hold - Supine Piriformis Stretch with Foot on Ground  - 2 x daily - 7 x weekly - 3 reps - 30 sec hold - Supine Figure 4 Piriformis Stretch  - 2 x daily - 7 x weekly - 3 reps - 30 sec hold - Seated Hamstring Stretch  - 2 x daily - 7 x weekly - 3 reps - 30 sec hold  Patient Education - Trigger Point Dry Needling   ASSESSMENT:  CLINICAL IMPRESSION: Katelyn Fisher is a 71 y.o. female who was seen today for physical therapy evaluation and treatment for L-sided low back and gluteal pain with L LE radiculopathy.  She reports onset of pain while performing chair exercises back in February, with pain seeming to worsen over time although still variable and intermittent in presentation.  Current deficits include limited and painful lumbar AROM, abnormal muscle tension with TTP in L>R lumbar paraspinals, glutes and piriformis, decreased lumbopelvic/proximal LE flexibility, and L>R LE weakness.  Special test positive for straight leg raise on left.  Pain prevents her from bending over,  carrying things on her left side, and limited tolerance for positioning and activity/ADLs throughout her day.  Charlena will benefit from skilled PT to address above deficits to improve mobility and activity tolerance with decreased pain interference.   OBJECTIVE IMPAIRMENTS: decreased activity tolerance, decreased endurance, decreased knowledge of condition, decreased mobility,  difficulty walking, decreased ROM, decreased strength, hypomobility, increased fascial restrictions, impaired perceived functional ability, increased muscle spasms, impaired flexibility, impaired sensation, improper body mechanics, postural dysfunction, and pain.   ACTIVITY LIMITATIONS: carrying, lifting, bending, sitting, standing, squatting, sleeping, stairs, transfers, bed mobility, bathing, dressing, locomotion level, and caring for others  PARTICIPATION LIMITATIONS: meal prep, cleaning, laundry, driving, shopping, community activity, and yard work  PERSONAL FACTORS: Age, Fitness, Past/current experiences, Time since onset of injury/illness/exacerbation, and 3+ comorbidities: Prediabetes/DM-II, thyroid disease, hx of endometrial CA treated with chemo, osteopenia, OA, HTN, HLD, GERD, BPPV, asthma, anxiety, depression, h/o flank pain and LBP with R LE radiculopathy  are also affecting patient's functional outcome.   REHAB POTENTIAL: Good  CLINICAL DECISION MAKING: Stable/uncomplicated  EVALUATION COMPLEXITY: Low   GOALS: Goals reviewed with patient? Yes  SHORT TERM GOALS: Target date: 01/23/2023  Patient will be independent with initial HEP to improve outcomes and carryover.  Baseline: Initial HEP provided on eval Goal status: INITIAL  2.  Patient will report centralization of radicular symptoms.  Baseline: Intermittent L LE pain, numbness and tingling down to foot Goal status: INITIAL  LONG TERM GOALS: Target date: 02/13/2023  Patient will be independent with ongoing/advanced HEP for self-management at home.  Baseline:  Goal status: INITIAL  2.  Patient will report 50-75% improvement in low back pain and L LE radiculopathy to improve QOL.  Baseline: 3-4/10 Goal status: INITIAL  3.  Patient to demonstrate ability to achieve and maintain good spinal alignment/posturing and body mechanics needed for daily activities. Baseline:  Goal status: INITIAL  4.  Patient will demonstrate  functional pain free lumbar ROM to perform ADLs.   Baseline: Limited lumbar AROM in all planes except rotation, with pain for all motions except right lateral flexion - refer to above lumbar ROM table Goal status: INITIAL  5.  Patient will demonstrate improved B LE strength to >/= 4+/5 for improved stability and ease of mobility . Baseline: Refer to above LE MMT table Goal status: INITIAL  6.  Patient will report >/= 55 on lumbar FOTO to demonstrate improved functional ability.  Baseline: 44 Goal status: INITIAL   7. Patient will report </= 34% on Modified Oswestry to demonstrate improved functional ability with decreased pain interference. Baseline: 23 / 50 = 46.0 % Goal status: INITIAL  8.  Patient will report >/= 35/80 on LEFS to demonstrate improved functional ability with decreased pain interference. Baseline: 26 / 80 = 32.5 % Goal status: INITIAL  9.  Patient will tolerate 30+ min of standing or walking w/o increased pain to allow for improved mobility and activity tolerance. Baseline: Standing tolerance limited to <30 minutes and only able to walk short distances in home or yard without limitation due to pain Goal status: INITIAL   PLAN:  PT FREQUENCY: 2x/week  PT DURATION: 6 weeks  PLANNED INTERVENTIONS: Therapeutic exercises, Therapeutic activity, Neuromuscular re-education, Balance training, Gait training, Patient/Family education, Self Care, Joint mobilization, Stair training, DME instructions, Aquatic Therapy, Dry Needling, Electrical stimulation, Spinal manipulation, Spinal mobilization, Cryotherapy, Moist heat, Taping, Traction, Ultrasound, Ionotophoresis 4mg /ml Dexamethasone, Manual therapy, and Re-evaluation  PLAN FOR NEXT SESSION: Review initial HEP; progress lumbar flexibility and strengthening - update  HEP accordingly; MT +/- DN to address abnormal muscle tension lumbar paraspinals, glutes and piriformis   Marry Guan, PT 01/02/2023, 11:31 AM

## 2023-01-02 ENCOUNTER — Ambulatory Visit: Payer: Medicare Other | Attending: Family Medicine | Admitting: Physical Therapy

## 2023-01-02 ENCOUNTER — Other Ambulatory Visit: Payer: Self-pay

## 2023-01-02 ENCOUNTER — Encounter: Payer: Self-pay | Admitting: Physical Therapy

## 2023-01-02 DIAGNOSIS — M5416 Radiculopathy, lumbar region: Secondary | ICD-10-CM | POA: Insufficient documentation

## 2023-01-02 DIAGNOSIS — M5459 Other low back pain: Secondary | ICD-10-CM | POA: Insufficient documentation

## 2023-01-02 DIAGNOSIS — M7918 Myalgia, other site: Secondary | ICD-10-CM | POA: Diagnosis not present

## 2023-01-02 DIAGNOSIS — R262 Difficulty in walking, not elsewhere classified: Secondary | ICD-10-CM | POA: Insufficient documentation

## 2023-01-02 DIAGNOSIS — M545 Low back pain, unspecified: Secondary | ICD-10-CM | POA: Insufficient documentation

## 2023-01-02 DIAGNOSIS — M6281 Muscle weakness (generalized): Secondary | ICD-10-CM | POA: Insufficient documentation

## 2023-01-03 ENCOUNTER — Other Ambulatory Visit (HOSPITAL_BASED_OUTPATIENT_CLINIC_OR_DEPARTMENT_OTHER): Payer: Self-pay

## 2023-01-03 DIAGNOSIS — M5416 Radiculopathy, lumbar region: Secondary | ICD-10-CM | POA: Diagnosis not present

## 2023-01-03 DIAGNOSIS — M5136 Other intervertebral disc degeneration, lumbar region: Secondary | ICD-10-CM | POA: Diagnosis not present

## 2023-01-03 DIAGNOSIS — M48062 Spinal stenosis, lumbar region with neurogenic claudication: Secondary | ICD-10-CM | POA: Diagnosis not present

## 2023-01-03 DIAGNOSIS — M47896 Other spondylosis, lumbar region: Secondary | ICD-10-CM | POA: Diagnosis not present

## 2023-01-03 MED ORDER — TRAMADOL HCL 50 MG PO TABS
50.0000 mg | ORAL_TABLET | Freq: Three times a day (TID) | ORAL | 0 refills | Status: DC | PRN
  Filled 2023-01-03: qty 60, 20d supply, fill #0

## 2023-01-06 ENCOUNTER — Other Ambulatory Visit: Payer: Self-pay | Admitting: Family Medicine

## 2023-01-06 DIAGNOSIS — E1169 Type 2 diabetes mellitus with other specified complication: Secondary | ICD-10-CM

## 2023-01-07 NOTE — Therapy (Signed)
OUTPATIENT PHYSICAL THERAPY TREATMENT   Patient Name: BIJOUX ROSSMILLER MRN: [id number] DOB:1950/09/16, 72 y.o., female Today's Date: 01/09/2023  END OF SESSION:  PT End of Session - 01/09/23 0805     Visit Number 2    Date for PT Re-Evaluation 02/13/23    Authorization Type [employer] & [employer]    Progress Note Due on Visit 10    PT Start Time 0803    PT Stop Time 0845    PT Time Calculation (min) 42 min    Activity Tolerance Patient tolerated treatment well    Behavior During Therapy WFL for tasks assessed/performed              Past Medical History:  Diagnosis Date   Allergy    SEASONAL   Anxiety    Arthritis    back   Asthma    Depression    Endometrial cancer (HCC) 2015   Family history of breast cancer    Family history of colon cancer    Family history of ovarian cancer    GERD (gastroesophageal reflux disease)    History of colonic diverticulitis    Hx of colonic polyps    Hyperlipidemia    Hypertension    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   Osteopenia 09/2017   T score -1.2 FRAX 3.2% / 0.3%   Personal history of chemotherapy 2015   Radiation 12/17/14-01/03/15   vaginal brachytherapy   Thyroid disease 06/01/2013   Past Surgical History:  Procedure Laterality Date   ABDOMINAL HYSTERECTOMY  06/23/2014   [facility], TRH/BSO   CHOLECYSTECTOMY  1991   COLONOSCOPY     DILATION AND CURETTAGE OF UTERUS     IR REMOVAL TUN ACCESS W/ PORT W/O FL MOD SED  04/30/2017   POLYPECTOMY     TUBAL LIGATION     Patient Active Problem List   Diagnosis Date Noted   Acute cough 12/19/2022   Chronic rhinitis 08/21/2021   Mass of left elbow 09/14/2019   Flank pain 09/14/2019   Subacromial impingement of left shoulder 12/30/2018   Not well controlled moderate persistent asthma 08/07/2018   Current use of beta blocker 06/30/2018   Perennial allergic rhinitis 06/30/2018   Acute bronchitis due to other specified organisms 06/30/2018   History of endometrial cancer  02/03/2018   Screening Mammogram 11/05/2017   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   Type 2 diabetes mellitus with diabetic neuropathy, without long-term current use of insulin (HCC) 10/25/2016   Microcytosis 09/28/2016   Mild intermittent asthma without complication 01/26/2016   Port catheter in place 11/15/2015   Family history of breast cancer    Family history of ovarian cancer    Family history of prostate cancer    Family history of colon cancer    Morbid obesity (HCC) 11/09/2014   H/O total hysterectomy with bilateral salpingo-oophorectomy (BSO) 06/24/2014   Endometrial cancer (HCC) 05/20/2014   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   Hypothyroidism 06/01/2013   Low back pain radiating to right leg 08/18/2012   Anemia 11/07/2011   Hyperlipidemia 06/15/2011   Diabetes mellitus type 2 in obese 03/12/2011   Acute pain of right knee 01/27/2011   Obstructive sleep apnea 11/15/2010   Osteoarthritis 11/15/2010   Gastroesophageal reflux disease without esophagitis 09/14/2010   DIVERTICULITIS, HX OF 09/14/2010   POSTMENOPAUSAL STATUS 10/01/2008   COLONIC POLYPS, HX OF 03/10/2008   Essential hypertension 08/13/2006    PCP: Sharlene Dory, DO   REFERRING PROVIDER: Sharlene Dory, DO  REFERRING DIAG:  M79.18 (ICD-10-CM) - Gluteal pain M54.50 (ICD-10-CM) - Left-sided low back pain without sciatica, unspecified chronicity  THERAPY DIAG:  Other low back pain  Radiculopathy, lumbar region  Muscle weakness (generalized)  Difficulty in walking, not elsewhere classified  RATIONALE FOR EVALUATION AND TREATMENT: Rehabilitation  ONSET DATE: February 2024  NEXT MD VISIT: 04/10/23   SUBJECTIVE:                                                                                                                                                                                                         SUBJECTIVE STATEMENT: Saw Dr. Ethelene Hal yesterday. Doesn't  see again until October. Pain 3/10 today.  Eval: Pt reports L-sided LBP extending into L LE to thigh and sometimes all the way down her L LE. Onset of pain in February while completing some chair exercises and pain has persisted since, worsening overall but intermittent/variable in nature. She will see her orthopedic MD, Dr. Ethelene Hal, tomorrow. She would like to try everything she can to avoid surgery.  PAIN: Are you having pain? Yes: NPRS scale: 3-4/10 Pain location: L low back & intermittently down L LE Pain description: sharp Aggravating factors: shifting weight onto L LE, carrying things on the L side, bending over, lower body dressing, crossing her legs Relieving factors: 2 wk benefit from injection, Tylenol, Voltaren gel  PERTINENT HISTORY:  Prediabetes/DM-II, thyroid disease, hx of endometrial CA treated with chemo, osteopenia, OA, HTN, HLD, GERD, BPPV, asthma, anxiety, depression, h/o flank pain and LBP with R LE radiculopathy  PRECAUTIONS: None  WEIGHT BEARING RESTRICTIONS: No  FALLS:  Has patient fallen in last 6 months? No  LIVING ENVIRONMENT: Lives with: lives with their spouse Lives in: House/apartment Stairs: Yes: External: 1 steps; on left going up Has following equipment at home: Single point cane and Grab bars  OCCUPATION: Retired  PLOF: Independent and Leisure: read, Clinical cytogeneticist, walking around the house or in the backyard or short distances in the neighborhood  PATIENT GOALS: "To relieve the pain."   OBJECTIVE: (objective measures completed at initial evaluation unless otherwise dated)  DIAGNOSTIC FINDINGS:  10/10/19 - Lumbar MRI IMPRESSION: 1. Facet osteoarthritis at L3-4 and below with L4-5 and L5-S1 anterolisthesis. 2. Variable disc degeneration at L2-3 and below, advanced at L5-S1. 3. Spinal stenosis that is high-grade at L4-5 and moderate at L5-S1. 4. Moderate foraminal narrowing on the left at L4-5 and bilaterally at L5-S1.  PATIENT SURVEYS:  Modified  Oswestry 23 / 50 = 46.0 %  LEFS 26 / 80 = 32.5 % FOTO : lumbar  spine = 44, predicted = 55  SCREENING FOR RED FLAGS: Bowel or bladder incontinence: No Spinal tumors: No Cauda equina syndrome: No Compression fracture: No Abdominal aneurysm: No  COGNITION:  Overall cognitive status: Within functional limits for tasks assessed    SENSATION: WFL Occasional numbness and tingling down L LE  MUSCLE LENGTH: Hamstrings: mild tightness L>R ITB: mod tight R>L Piriformis: mod tight R>L Hip flexors: mild tight B Quads: mild tight B Heelcord: NT  POSTURE:  rounded shoulders, forward head, increased lumbar lordosis, and anterior pelvic tilt  PALPATION: Increased muscle tension and TTP in L>R lumbar paraspinals, glutes, and piriformis  LUMBAR ROM:   Active  Eval *  Flexion Hands to mid shins  Extension 50% limited  Right lateral flexion Hand to lower thigh  Left lateral flexion Hand to lateral knee  Right rotation WFL  Left rotation WFL  (Blank rows = not tested, * = increased pain with all motions on eval except R lateral flexion)  LOWER EXTREMITY ROM:    Mildly limited hip ROM due to body habitus, but otherwise WFL  LOWER EXTREMITY MMT:    MMT Right eval Left eval  Hip flexion 4+ 4  Hip extension 3+ 2+  Hip abduction 4 4-  Hip adduction 4- 3-  Hip internal rotation 5 4  Hip external rotation 4- 3+  Knee flexion 5 5  Knee extension 5 5  Ankle dorsiflexion 4 4  Ankle plantarflexion    Ankle inversion    Ankle eversion     (Blank rows = not tested)  LUMBAR SPECIAL TESTS:  Straight leg raise test: Positive on left   TODAY'S TREATMENT:   01/09/23 THERAPEUTIC EXERCISE: to improve flexibility, strength and mobility.  Demonstration, verbal and tactile cues throughout for technique. Nustep L5 x 6 min Supine L SKTC with pillowcase for extended reach 2x 30" Hooklying L KTOS piriformis stretch 2x 30" Hooklying L figure-4 piriformis stretch 2x 30" Seated L hip hinge HS  stretch with foot elevated on 8 inch 2 x 30" Prone lying, POE and press ups x 10 Prone over 2 pillows decreases pain Prone hip ext x 10 ea alt MANUAL THERAPY: to decrease muscle spasm and pain and improve mobility STM  and IASTM with massage gun to L piriformis, gluteals and lateral leg and thigh   01/02/23 THERAPEUTIC EXERCISE: to improve flexibility, strength and mobility.  Demonstration, verbal and tactile cues throughout for technique. Supine L SKTC with pillowcase for extended reach x 30" Hooklying L KTOS piriformis stretch x 30" Hooklying L figure-4 piriformis stretch x 30" Seated L hip hinge HS stretch with foot elevated on trash can x 30"   PATIENT EDUCATION:  Education details: PT eval findings, anticipated POC, initial HEP, and role of DN Person educated: Patient Education method: Explanation, Demonstration, Tactile cues, Verbal cues, and Handouts Education comprehension: verbalized understanding, returned demonstration, verbal cues required, tactile cues required, and needs further education  HOME EXERCISE PROGRAM: Access Code: [email or web address] URL: [email or web address] Date: 01/02/2023 Prepared by: Glenetta Hew  Exercises - Hooklying Single Knee to Chest Stretch with Towel  - 2 x daily - 7 x weekly - 3 reps - 30 sec hold - Supine Piriformis Stretch with Foot on Ground  - 2 x daily - 7 x weekly - 3 reps - 30 sec hold - Supine Figure 4 Piriformis Stretch  - 2 x daily - 7 x weekly - 3 reps - 30 sec hold - Seated Hamstring Stretch  - 2 x daily -  7 x weekly - 3 reps - 30 sec hold  Patient Education - Trigger Point Dry Needling   ASSESSMENT:  CLINICAL IMPRESSION: [place] tolerated her first PT session well. She got some relief with prone lying over 2 pillows so advised she try this at home every few hours for five minutes. Good response to MT reporting less pain at end of session. She continues to demonstrate potential for improvement and would benefit from  continued skilled therapy to address impairments.    OBJECTIVE IMPAIRMENTS: decreased activity tolerance, decreased endurance, decreased knowledge of condition, decreased mobility, difficulty walking, decreased ROM, decreased strength, hypomobility, increased fascial restrictions, impaired perceived functional ability, increased muscle spasms, impaired flexibility, impaired sensation, improper body mechanics, postural dysfunction, and pain.   ACTIVITY LIMITATIONS: carrying, lifting, bending, sitting, standing, squatting, sleeping, stairs, transfers, bed mobility, bathing, dressing, locomotion level, and caring for others  PARTICIPATION LIMITATIONS: meal prep, cleaning, laundry, driving, shopping, community activity, and yard work  PERSONAL FACTORS: Age, Fitness, Past/current experiences, Time since onset of injury/illness/exacerbation, and 3+ comorbidities: Prediabetes/DM-II, thyroid disease, hx of endometrial CA treated with chemo, osteopenia, OA, HTN, HLD, GERD, BPPV, asthma, anxiety, depression, h/o flank pain and LBP with R LE radiculopathy  are also affecting patient's functional outcome.   REHAB POTENTIAL: Good  CLINICAL DECISION MAKING: Stable/uncomplicated  EVALUATION COMPLEXITY: Low   GOALS: Goals reviewed with patient? Yes  SHORT TERM GOALS: Target date: 01/23/2023  Patient will be independent with initial HEP to improve outcomes and carryover.  Baseline: Initial HEP provided on eval Goal status: INITIAL  2.  Patient will report centralization of radicular symptoms.  Baseline: Intermittent L LE pain, numbness and tingling down to foot Goal status: INITIAL  LONG TERM GOALS: Target date: 02/13/2023  Patient will be independent with ongoing/advanced HEP for self-management at home.  Baseline:  Goal status: INITIAL  2.  Patient will report 50-75% improvement in low back pain and L LE radiculopathy to improve QOL.  Baseline: 3-4/10 Goal status: INITIAL  3.  Patient to  demonstrate ability to achieve and maintain good spinal alignment/posturing and body mechanics needed for daily activities. Baseline:  Goal status: INITIAL  4.  Patient will demonstrate functional pain free lumbar ROM to perform ADLs.   Baseline: Limited lumbar AROM in all planes except rotation, with pain for all motions except right lateral flexion - refer to above lumbar ROM table Goal status: INITIAL  5.  Patient will demonstrate improved B LE strength to >/= 4+/5 for improved stability and ease of mobility . Baseline: Refer to above LE MMT table Goal status: INITIAL  6.  Patient will report >/= 55 on lumbar FOTO to demonstrate improved functional ability.  Baseline: 44 Goal status: INITIAL   7. Patient will report </= 34% on Modified Oswestry to demonstrate improved functional ability with decreased pain interference. Baseline: 23 / 50 = 46.0 % Goal status: INITIAL  8.  Patient will report >/= 35/80 on LEFS to demonstrate improved functional ability with decreased pain interference. Baseline: 26 / 80 = 32.5 % Goal status: INITIAL  9.  Patient will tolerate 30+ min of standing or walking w/o increased pain to allow for improved mobility and activity tolerance. Baseline: Standing tolerance limited to <30 minutes and only able to walk short distances in home or yard without limitation due to pain Goal status: INITIAL   PLAN:  PT FREQUENCY: 2x/week  PT DURATION: 6 weeks  PLANNED INTERVENTIONS: Therapeutic exercises, Therapeutic activity, Neuromuscular re-education, Balance training, Gait training, Patient/Family education,  Self Care, Joint mobilization, Stair training, DME instructions, Aquatic Therapy, Dry Needling, Electrical stimulation, Spinal manipulation, Spinal mobilization, Cryotherapy, Moist heat, Taping, Traction, Ultrasound, Ionotophoresis 4mg /ml Dexamethasone, Manual therapy, and Re-evaluation  PLAN FOR NEXT SESSION: progress lumbar flexibility and strengthening -  update HEP accordingly; MT +/- DN to address abnormal muscle tension lumbar paraspinals, glutes and piriformis   Solon Palm, PT 01/09/2023, 8:51 AM

## 2023-01-09 ENCOUNTER — Encounter: Payer: Self-pay | Admitting: Physical Therapy

## 2023-01-09 ENCOUNTER — Ambulatory Visit: Payer: Medicare Other | Admitting: Physical Therapy

## 2023-01-09 DIAGNOSIS — M545 Low back pain, unspecified: Secondary | ICD-10-CM | POA: Diagnosis not present

## 2023-01-09 DIAGNOSIS — M6281 Muscle weakness (generalized): Secondary | ICD-10-CM | POA: Diagnosis not present

## 2023-01-09 DIAGNOSIS — R262 Difficulty in walking, not elsewhere classified: Secondary | ICD-10-CM

## 2023-01-09 DIAGNOSIS — M5416 Radiculopathy, lumbar region: Secondary | ICD-10-CM

## 2023-01-09 DIAGNOSIS — M7918 Myalgia, other site: Secondary | ICD-10-CM | POA: Diagnosis not present

## 2023-01-09 DIAGNOSIS — M5459 Other low back pain: Secondary | ICD-10-CM | POA: Diagnosis not present

## 2023-01-11 LAB — HM DIABETES EYE EXAM

## 2023-01-15 ENCOUNTER — Encounter: Payer: Self-pay | Admitting: Physical Therapy

## 2023-01-15 ENCOUNTER — Ambulatory Visit: Payer: Medicare Other | Admitting: Physical Therapy

## 2023-01-15 DIAGNOSIS — M545 Low back pain, unspecified: Secondary | ICD-10-CM | POA: Diagnosis not present

## 2023-01-15 DIAGNOSIS — R262 Difficulty in walking, not elsewhere classified: Secondary | ICD-10-CM

## 2023-01-15 DIAGNOSIS — M5459 Other low back pain: Secondary | ICD-10-CM

## 2023-01-15 DIAGNOSIS — M5416 Radiculopathy, lumbar region: Secondary | ICD-10-CM | POA: Diagnosis not present

## 2023-01-15 DIAGNOSIS — M6281 Muscle weakness (generalized): Secondary | ICD-10-CM

## 2023-01-15 DIAGNOSIS — M7918 Myalgia, other site: Secondary | ICD-10-CM | POA: Diagnosis not present

## 2023-01-15 NOTE — Therapy (Signed)
OUTPATIENT PHYSICAL THERAPY TREATMENT   Patient Name: DAMARIA VACHON MRN: [id number] DOB:02/16/1951, 72 y.o., female Today's Date: 01/15/2023  END OF SESSION:  PT End of Session - 01/15/23 0801     Visit Number 3    Date for PT Re-Evaluation 02/13/23    Authorization Type [employer] & [employer]    Progress Note Due on Visit 10    PT Start Time 0801    PT Stop Time 0850    PT Time Calculation (min) 49 min    Activity Tolerance Patient tolerated treatment well    Behavior During Therapy WFL for tasks assessed/performed              Past Medical History:  Diagnosis Date   Allergy    SEASONAL   Anxiety    Arthritis    back   Asthma    Depression    Endometrial cancer (HCC) 2015   Family history of breast cancer    Family history of colon cancer    Family history of ovarian cancer    GERD (gastroesophageal reflux disease)    History of colonic diverticulitis    Hx of colonic polyps    Hyperlipidemia    Hypertension    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   Osteopenia 09/2017   T score -1.2 FRAX 3.2% / 0.3%   Personal history of chemotherapy 2015   Radiation 12/17/14-01/03/15   vaginal brachytherapy   Thyroid disease 06/01/2013   Past Surgical History:  Procedure Laterality Date   ABDOMINAL HYSTERECTOMY  06/23/2014   [facility], TRH/BSO   CHOLECYSTECTOMY  1991   COLONOSCOPY     DILATION AND CURETTAGE OF UTERUS     IR REMOVAL TUN ACCESS W/ PORT W/O FL MOD SED  04/30/2017   POLYPECTOMY     TUBAL LIGATION     Patient Active Problem List   Diagnosis Date Noted   Acute cough 12/19/2022   Chronic rhinitis 08/21/2021   Mass of left elbow 09/14/2019   Flank pain 09/14/2019   Subacromial impingement of left shoulder 12/30/2018   Not well controlled moderate persistent asthma 08/07/2018   Current use of beta blocker 06/30/2018   Perennial allergic rhinitis 06/30/2018   Acute bronchitis due to other specified organisms 06/30/2018   History of endometrial cancer  02/03/2018   Screening Mammogram 11/05/2017   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   Type 2 diabetes mellitus with diabetic neuropathy, without long-term current use of insulin (HCC) 10/25/2016   Microcytosis 09/28/2016   Mild intermittent asthma without complication 01/26/2016   Port catheter in place 11/15/2015   Family history of breast cancer    Family history of ovarian cancer    Family history of prostate cancer    Family history of colon cancer    Morbid obesity (HCC) 11/09/2014   H/O total hysterectomy with bilateral salpingo-oophorectomy (BSO) 06/24/2014   Endometrial cancer (HCC) 05/20/2014   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   Hypothyroidism 06/01/2013   Low back pain radiating to right leg 08/18/2012   Anemia 11/07/2011   Hyperlipidemia 06/15/2011   Diabetes mellitus type 2 in obese 03/12/2011   Acute pain of right knee 01/27/2011   Obstructive sleep apnea 11/15/2010   Osteoarthritis 11/15/2010   Gastroesophageal reflux disease without esophagitis 09/14/2010   DIVERTICULITIS, HX OF 09/14/2010   POSTMENOPAUSAL STATUS 10/01/2008   COLONIC POLYPS, HX OF 03/10/2008   Essential hypertension 08/13/2006    PCP: Sharlene Dory, DO   REFERRING PROVIDER: Sharlene Dory, DO  REFERRING DIAG:  M79.18 (ICD-10-CM) - Gluteal pain M54.50 (ICD-10-CM) - Left-sided low back pain without sciatica, unspecified chronicity  THERAPY DIAG:  Other low back pain  Radiculopathy, lumbar region  Muscle weakness (generalized)  Difficulty in walking, not elsewhere classified  RATIONALE FOR EVALUATION AND TREATMENT: Rehabilitation  ONSET DATE: February 2024  NEXT MD VISIT: 04/10/23   SUBJECTIVE:                                                                                                                                                                                                         SUBJECTIVE STATEMENT: Pt reports HEP going well. She  bought herself a Scientist, clinical (histocompatibility and immunogenetics) as this seemed to help a lot last visit.Marland Kitchen  PAIN: Are you having pain? Yes: NPRS scale: 3/10 Pain location: L low back/buttock & intermittently down L LE Pain description: sharp Aggravating factors: shifting weight onto L LE, carrying things on the L side, bending over, lower body dressing, crossing her legs Relieving factors: 2 wk benefit from injection, Tylenol, Voltaren gel, massage gun  PERTINENT HISTORY:  Prediabetes/DM-II, thyroid disease, hx of endometrial CA treated with chemo, osteopenia, OA, HTN, HLD, GERD, BPPV, asthma, anxiety, depression, h/o flank pain and LBP with R LE radiculopathy  PRECAUTIONS: None  WEIGHT BEARING RESTRICTIONS: No  FALLS:  Has patient fallen in last 6 months? No  LIVING ENVIRONMENT: Lives with: lives with their spouse Lives in: House/apartment Stairs: Yes: External: 1 steps; on left going up Has following equipment at home: Single point cane and Grab bars  OCCUPATION: Retired  PLOF: Independent and Leisure: read, Clinical cytogeneticist, walking around the house or in the backyard or short distances in the neighborhood  PATIENT GOALS: "To relieve the pain."   OBJECTIVE: (objective measures completed at initial evaluation unless otherwise dated)  DIAGNOSTIC FINDINGS:  10/10/19 - Lumbar MRI IMPRESSION: 1. Facet osteoarthritis at L3-4 and below with L4-5 and L5-S1 anterolisthesis. 2. Variable disc degeneration at L2-3 and below, advanced at L5-S1. 3. Spinal stenosis that is high-grade at L4-5 and moderate at L5-S1. 4. Moderate foraminal narrowing on the left at L4-5 and bilaterally at L5-S1.  PATIENT SURVEYS:  Modified Oswestry 23 / 50 = 46.0 %  LEFS 26 / 80 = 32.5 % FOTO : lumbar spine = 44, predicted = 55  SCREENING FOR RED FLAGS: Bowel or bladder incontinence: No Spinal tumors: No Cauda equina syndrome: No Compression fracture: No Abdominal aneurysm: No  COGNITION:  Overall cognitive status: Within functional limits  for tasks assessed    SENSATION: WFL Occasional numbness and tingling down L LE  MUSCLE LENGTH: Hamstrings: mild tightness L>R ITB: mod tight R>L Piriformis: mod tight R>L Hip flexors: mild tight B Quads: mild tight B Heelcord: NT  POSTURE:  rounded shoulders, forward head, increased lumbar lordosis, and anterior pelvic tilt  PALPATION: Increased muscle tension and TTP in L>R lumbar paraspinals, glutes, and piriformis  LUMBAR ROM:   Active  Eval *  Flexion Hands to mid shins  Extension 50% limited  Right lateral flexion Hand to lower thigh  Left lateral flexion Hand to lateral knee  Right rotation WFL  Left rotation WFL  (Blank rows = not tested, * = increased pain with all motions on eval except R lateral flexion)  LOWER EXTREMITY ROM:    Mildly limited hip ROM due to body habitus, but otherwise WFL  LOWER EXTREMITY MMT:    MMT Right eval Left eval  Hip flexion 4+ 4  Hip extension 3+ 2+  Hip abduction 4 4-  Hip adduction 4- 3-  Hip internal rotation 5 4  Hip external rotation 4- 3+  Knee flexion 5 5  Knee extension 5 5  Ankle dorsiflexion 4 4  Ankle plantarflexion    Ankle inversion    Ankle eversion     (Blank rows = not tested)  LUMBAR SPECIAL TESTS:  Straight leg raise test: Positive on left   TODAY'S TREATMENT:   01/15/23 THERAPEUTIC EXERCISE: to improve flexibility, strength and mobility.  Demonstration, verbal and tactile cues throughout for technique. Nustep L5 x 6 min Prone over 2 pillows x 2 min before initiation of exercise and on rest breaks t/o prone exercises Prone over 2 pillows + alt hip ext 2 x 10 POE 2 x 1 min Prone press up 2 x 5 L S/L clam 2 x 10 Hooklying TrA isometrics 10 x 5" Hooklying PPT 10 x 5" Hooklying TrA/PPT + hip ADD ball squeeze isometric 10 x 5" Hooklying TrA/PPT + alt  hip ABD/ER bent-knee fallout 10 x 5"  MANUAL THERAPY: To promote normalized muscle tension, improved flexibility, and reduced pain.  STM and  IASTM with massage gun to L lateral glutes and piriformis Discussed possible DN, but pt wishing to wait to see how she responds to other therapeutic interventions   01/09/23 THERAPEUTIC EXERCISE: to improve flexibility, strength and mobility.  Demonstration, verbal and tactile cues throughout for technique. Nustep L5 x 6 min Supine L SKTC with pillowcase for extended reach 2x 30" Hooklying L KTOS piriformis stretch 2x 30" Hooklying L figure-4 piriformis stretch 2x 30" Seated L hip hinge HS stretch with foot elevated on 8 inch 2 x 30" Prone lying, POE and press ups x 10 Prone over 2 pillows decreases pain Prone hip ext x 10 ea alt MANUAL THERAPY: to decrease muscle spasm and pain and improve mobility STM  and IASTM with massage gun to L piriformis, gluteals and lateral leg and thigh   01/02/23 THERAPEUTIC EXERCISE: to improve flexibility, strength and mobility.  Demonstration, verbal and tactile cues throughout for technique. Supine L SKTC with pillowcase for extended reach x 30" Hooklying L KTOS piriformis stretch x 30" Hooklying L figure-4 piriformis stretch x 30" Seated L hip hinge HS stretch with foot elevated on trash can x 30"   PATIENT EDUCATION:  Education details: HEP update Person educated: Patient Education method: Explanation, Demonstration, Tactile cues, Verbal cues, and Handouts Education comprehension: verbalized understanding, returned demonstration, verbal cues required, tactile cues required, and needs further education  HOME EXERCISE PROGRAM: Access Code: [email or web address] URL: [email or web address] Date: 01/15/2023 Prepared by:  Glenetta Hew  Exercises - Hooklying Single Knee to Chest Stretch with Towel  - 2 x daily - 7 x weekly - 3 reps - 30 sec hold - Supine Piriformis Stretch with Foot on Ground  - 2 x daily - 7 x weekly - 3 reps - 30 sec hold - Supine Figure 4 Piriformis Stretch  - 2 x daily - 7 x weekly - 3 reps - 30 sec hold - Seated Hamstring  Stretch  - 2 x daily - 7 x weekly - 3 reps - 30 sec hold - Prone Hip Extension - Two Pillows  - 1 x daily - 7 x weekly - 2 sets - 10 reps - 3 sec hold - Clamshell  - 1 x daily - 7 x weekly - 2 sets - 10 reps - 3 sec hold - Supine Hip Adduction Isometric with Ball  - 2 x daily - 7 x weekly - 2 sets - 10 reps - 5 sec hold  Patient Education - Trigger Point Dry Needling   ASSESSMENT:  CLINICAL IMPRESSION: Asja notes benefit from massage gun and reports she purchased one for herself. Continued tightness/increased muscle tension evident in L lateral glutes and piriformis - discussed possiblity of DN to address abnormal muscle tension, but pt wishing to continue with current MT including massage gun as well as therapeutic stretches and exercises to see how she responds before trying DN. Gently progressed lumbopelvic strengthening addressing primary areas of weakness identified on eval with good tolerance reported, therefore HEP updated accordingly  OBJECTIVE IMPAIRMENTS: decreased activity tolerance, decreased endurance, decreased knowledge of condition, decreased mobility, difficulty walking, decreased ROM, decreased strength, hypomobility, increased fascial restrictions, impaired perceived functional ability, increased muscle spasms, impaired flexibility, impaired sensation, improper body mechanics, postural dysfunction, and pain.   ACTIVITY LIMITATIONS: carrying, lifting, bending, sitting, standing, squatting, sleeping, stairs, transfers, bed mobility, bathing, dressing, locomotion level, and caring for others  PARTICIPATION LIMITATIONS: meal prep, cleaning, laundry, driving, shopping, community activity, and yard work  PERSONAL FACTORS: Age, Fitness, Past/current experiences, Time since onset of injury/illness/exacerbation, and 3+ comorbidities: Prediabetes/DM-II, thyroid disease, hx of endometrial CA treated with chemo, osteopenia, OA, HTN, HLD, GERD, BPPV, asthma, anxiety, depression, h/o  flank pain and LBP with R LE radiculopathy  are also affecting patient's functional outcome.   REHAB POTENTIAL: Good  CLINICAL DECISION MAKING: Stable/uncomplicated  EVALUATION COMPLEXITY: Low   GOALS: Goals reviewed with patient? Yes  SHORT TERM GOALS: Target date: 01/23/2023  Patient will be independent with initial HEP to improve outcomes and carryover.  Baseline: Initial HEP provided on eval Goal status: IN PROGRESS  2.  Patient will report centralization of radicular symptoms.  Baseline: Intermittent L LE pain, numbness and tingling down to foot Goal status: IN PROGRESS  LONG TERM GOALS: Target date: 02/13/2023  Patient will be independent with ongoing/advanced HEP for self-management at home.  Baseline:  Goal status: IN PROGRESS  2.  Patient will report 50-75% improvement in low back pain and L LE radiculopathy to improve QOL.  Baseline: 3-4/10 Goal status: IN PROGRESS  3.  Patient to demonstrate ability to achieve and maintain good spinal alignment/posturing and body mechanics needed for daily activities. Baseline:  Goal status: IN PROGRESS  4.  Patient will demonstrate functional pain free lumbar ROM to perform ADLs.   Baseline: Limited lumbar AROM in all planes except rotation, with pain for all motions except right lateral flexion - refer to above lumbar ROM table Goal status: IN PROGRESS  5.  Patient  will demonstrate improved B LE strength to >/= 4+/5 for improved stability and ease of mobility . Baseline: Refer to above LE MMT table Goal status: IN PROGRESS  6.  Patient will report >/= 55 on lumbar FOTO to demonstrate improved functional ability.  Baseline: 44 Goal status: IN PROGRESS   7. Patient will report </= 34% on Modified Oswestry to demonstrate improved functional ability with decreased pain interference. Baseline: 23 / 50 = 46.0 % Goal status: IN PROGRESS  8.  Patient will report >/= 35/80 on LEFS to demonstrate improved functional ability with  decreased pain interference. Baseline: 26 / 80 = 32.5 % Goal status: IN PROGRESS  9.  Patient will tolerate 30+ min of standing or walking w/o increased pain to allow for improved mobility and activity tolerance. Baseline: Standing tolerance limited to <30 minutes and only able to walk short distances in home or yard without limitation due to pain Goal status: IN PROGRESS   PLAN:  PT FREQUENCY: 2x/week  PT DURATION: 6 weeks  PLANNED INTERVENTIONS: Therapeutic exercises, Therapeutic activity, Neuromuscular re-education, Balance training, Gait training, Patient/Family education, Self Care, Joint mobilization, Stair training, DME instructions, Aquatic Therapy, Dry Needling, Electrical stimulation, Spinal manipulation, Spinal mobilization, Cryotherapy, Moist heat, Taping, Traction, Ultrasound, Ionotophoresis 4mg /ml Dexamethasone, Manual therapy, and Re-evaluation  PLAN FOR NEXT SESSION: progress lumbar flexibility and strengthening - update HEP accordingly; MT +/- DN to address abnormal muscle tension lumbar paraspinals, glutes and piriformis   Solon Palm, PT 01/15/2023, 12:39 PM

## 2023-01-18 ENCOUNTER — Ambulatory Visit: Payer: Medicare Other

## 2023-01-18 DIAGNOSIS — R262 Difficulty in walking, not elsewhere classified: Secondary | ICD-10-CM

## 2023-01-18 DIAGNOSIS — M5416 Radiculopathy, lumbar region: Secondary | ICD-10-CM | POA: Diagnosis not present

## 2023-01-18 DIAGNOSIS — M6281 Muscle weakness (generalized): Secondary | ICD-10-CM

## 2023-01-18 DIAGNOSIS — M7918 Myalgia, other site: Secondary | ICD-10-CM | POA: Diagnosis not present

## 2023-01-18 DIAGNOSIS — M5459 Other low back pain: Secondary | ICD-10-CM

## 2023-01-18 DIAGNOSIS — M545 Low back pain, unspecified: Secondary | ICD-10-CM | POA: Diagnosis not present

## 2023-01-18 NOTE — Therapy (Signed)
OUTPATIENT PHYSICAL THERAPY TREATMENT   Patient Name: Katelyn Fisher MRN: [id number] DOB:1951-03-04, 72 y.o., female Today's Date: 01/18/2023  END OF SESSION:  PT End of Session - 01/18/23 0809     Visit Number 4    Date for PT Re-Evaluation 02/13/23    Authorization Type [employer] & [employer]    Progress Note Due on Visit 10    PT Start Time 0803    PT Stop Time 0848    PT Time Calculation (min) 45 min    Activity Tolerance Patient tolerated treatment well    Behavior During Therapy [facility] for tasks assessed/performed               Past Medical History:  Diagnosis Date   Allergy    SEASONAL   Anxiety    Arthritis    back   Asthma    Depression    Endometrial cancer (HCC) 2015   Family history of breast cancer    Family history of colon cancer    Family history of ovarian cancer    GERD (gastroesophageal reflux disease)    History of colonic diverticulitis    Hx of colonic polyps    Hyperlipidemia    Hypertension    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   Osteopenia 09/2017   T score -1.2 FRAX 3.2% / 0.3%   Personal history of chemotherapy 2015   Radiation 12/17/14-01/03/15   vaginal brachytherapy   Thyroid disease 06/01/2013   Past Surgical History:  Procedure Laterality Date   ABDOMINAL HYSTERECTOMY  06/23/2014   [facility], TRH/BSO   CHOLECYSTECTOMY  1991   COLONOSCOPY     DILATION AND CURETTAGE OF UTERUS     IR REMOVAL TUN ACCESS W/ PORT W/O FL MOD SED  04/30/2017   POLYPECTOMY     TUBAL LIGATION     Patient Active Problem List   Diagnosis Date Noted   Acute cough 12/19/2022   Chronic rhinitis 08/21/2021   Mass of left elbow 09/14/2019   Flank pain 09/14/2019   Subacromial impingement of left shoulder 12/30/2018   Not well controlled moderate persistent asthma 08/07/2018   Current use of beta blocker 06/30/2018   Perennial allergic rhinitis 06/30/2018   Acute bronchitis due to other specified organisms 06/30/2018   History of endometrial cancer  02/03/2018   Screening Mammogram 11/05/2017   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   Type 2 diabetes mellitus with diabetic neuropathy, without long-term current use of insulin (HCC) 10/25/2016   Microcytosis 09/28/2016   Mild intermittent asthma without complication 01/26/2016   Port catheter in place 11/15/2015   Family history of breast cancer    Family history of ovarian cancer    Family history of prostate cancer    Family history of colon cancer    Morbid obesity (HCC) 11/09/2014   H/O total hysterectomy with bilateral salpingo-oophorectomy (BSO) 06/24/2014   Endometrial cancer (HCC) 05/20/2014   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   Hypothyroidism 06/01/2013   Low back pain radiating to right leg 08/18/2012   Anemia 11/07/2011   Hyperlipidemia 06/15/2011   Diabetes mellitus type 2 in obese 03/12/2011   Acute pain of right knee 01/27/2011   Obstructive sleep apnea 11/15/2010   Osteoarthritis 11/15/2010   Gastroesophageal reflux disease without esophagitis 09/14/2010   DIVERTICULITIS, HX OF 09/14/2010   POSTMENOPAUSAL STATUS 10/01/2008   COLONIC POLYPS, HX OF 03/10/2008   Essential hypertension 08/13/2006    PCP: Sharlene Dory, DO   REFERRING PROVIDER: Sharlene Dory,  DO  REFERRING DIAG:  M79.18 (ICD-10-CM) - Gluteal pain M54.50 (ICD-10-CM) - Left-sided low back pain without sciatica, unspecified chronicity  THERAPY DIAG:  Other low back pain  Radiculopathy, lumbar region  Muscle weakness (generalized)  Difficulty in walking, not elsewhere classified  RATIONALE FOR EVALUATION AND TREATMENT: Rehabilitation  ONSET DATE: February 2024  NEXT MD VISIT: 04/10/23   SUBJECTIVE:                                                                                                                                                                                                         SUBJECTIVE STATEMENT: Pt reports yesterday went  walking around the block twice along with cooking in her kitchen ,after that her pain was about an 8/10. Today a 4/10  PAIN: Are you having pain? Yes: NPRS scale: 4/10 Pain location: L low back/buttock & intermittently down L LE Pain description: dull Aggravating factors: shifting weight onto L LE, carrying things on the L side, bending over, lower body dressing, crossing her legs Relieving factors: 2 wk benefit from injection, Tylenol, Voltaren gel, massage gun  PERTINENT HISTORY:  Prediabetes/DM-II, thyroid disease, hx of endometrial CA treated with chemo, osteopenia, OA, HTN, HLD, GERD, BPPV, asthma, anxiety, depression, h/o flank pain and LBP with R LE radiculopathy  PRECAUTIONS: None  WEIGHT BEARING RESTRICTIONS: No  FALLS:  Has patient fallen in last 6 months? No  LIVING ENVIRONMENT: Lives with: lives with their spouse Lives in: House/apartment Stairs: Yes: External: 1 steps; on left going up Has following equipment at home: Single point cane and Grab bars  OCCUPATION: Retired  PLOF: Independent and Leisure: read, Clinical cytogeneticist, walking around the house or in the backyard or short distances in the neighborhood  PATIENT GOALS: "To relieve the pain."   OBJECTIVE: (objective measures completed at initial evaluation unless otherwise dated)  DIAGNOSTIC FINDINGS:  10/10/19 - Lumbar MRI IMPRESSION: 1. Facet osteoarthritis at L3-4 and below with L4-5 and L5-S1 anterolisthesis. 2. Variable disc degeneration at L2-3 and below, advanced at L5-S1. 3. Spinal stenosis that is high-grade at L4-5 and moderate at L5-S1. 4. Moderate foraminal narrowing on the left at L4-5 and bilaterally at L5-S1.  PATIENT SURVEYS:  Modified Oswestry 23 / 50 = 46.0 %  LEFS 26 / 80 = 32.5 % FOTO : lumbar spine = 44, predicted = 55  SCREENING FOR RED FLAGS: Bowel or bladder incontinence: No Spinal tumors: No Cauda equina syndrome: No Compression fracture: No Abdominal aneurysm:  No  COGNITION:  Overall cognitive status: Within functional limits for tasks assessed  SENSATION: WFL Occasional numbness and tingling down L LE  MUSCLE LENGTH: Hamstrings: mild tightness L>R ITB: mod tight R>L Piriformis: mod tight R>L Hip flexors: mild tight B Quads: mild tight B Heelcord: NT  POSTURE:  rounded shoulders, forward head, increased lumbar lordosis, and anterior pelvic tilt  PALPATION: Increased muscle tension and TTP in L>R lumbar paraspinals, glutes, and piriformis  LUMBAR ROM:   Active  Eval *  Flexion Hands to mid shins  Extension 50% limited  Right lateral flexion Hand to lower thigh  Left lateral flexion Hand to lateral knee  Right rotation WFL  Left rotation WFL  (Blank rows = not tested, * = increased pain with all motions on eval except R lateral flexion)  LOWER EXTREMITY ROM:    Mildly limited hip ROM due to body habitus, but otherwise WFL  LOWER EXTREMITY MMT:    MMT Right eval Left eval  Hip flexion 4+ 4  Hip extension 3+ 2+  Hip abduction 4 4-  Hip adduction 4- 3-  Hip internal rotation 5 4  Hip external rotation 4- 3+  Knee flexion 5 5  Knee extension 5 5  Ankle dorsiflexion 4 4  Ankle plantarflexion    Ankle inversion    Ankle eversion     (Blank rows = not tested)  LUMBAR SPECIAL TESTS:  Straight leg raise test: Positive on left   TODAY'S TREATMENT:  01/18/23 THERAPEUTIC EXERCISE: to improve flexibility, strength and mobility.  Demonstration, verbal and tactile cues throughout for technique. Rec Bike L1x22min Prone lying with pillow under abdomen x 2 min Prone press ups 2x10 Prone knee flexion 2x12 bil Prone hip extension x 10 bil Supine L piriformis stretch 2x30 sec KTOS Supine pelvic tilts 10x5" Bridge with TrA x 10  Standing lumbar extension 10x3"  MANUAL THERAPY: To promote normalized muscle tension, improved flexibility, and reduced pain.  STM to L lateral glutes and piriformis  01/15/23 THERAPEUTIC  EXERCISE: to improve flexibility, strength and mobility.  Demonstration, verbal and tactile cues throughout for technique. Nustep L5 x 6 min Prone over 2 pillows x 2 min before initiation of exercise and on rest breaks t/o prone exercises Prone over 2 pillows + alt hip ext 2 x 10 POE 2 x 1 min Prone press up 2 x 5 L S/L clam 2 x 10 Hooklying TrA isometrics 10 x 5" Hooklying PPT 10 x 5" Hooklying TrA/PPT + hip ADD ball squeeze isometric 10 x 5" Hooklying TrA/PPT + alt  hip ABD/ER bent-knee fallout 10 x 5"  MANUAL THERAPY: To promote normalized muscle tension, improved flexibility, and reduced pain.  STM and IASTM with massage gun to L lateral glutes and piriformis Discussed possible DN, but pt wishing to wait to see how she responds to other therapeutic interventions   01/09/23 THERAPEUTIC EXERCISE: to improve flexibility, strength and mobility.  Demonstration, verbal and tactile cues throughout for technique. Nustep L5 x 6 min Supine L SKTC with pillowcase for extended reach 2x 30" Hooklying L KTOS piriformis stretch 2x 30" Hooklying L figure-4 piriformis stretch 2x 30" Seated L hip hinge HS stretch with foot elevated on 8 inch 2 x 30" Prone lying, POE and press ups x 10 Prone over 2 pillows decreases pain Prone hip ext x 10 ea alt MANUAL THERAPY: to decrease muscle spasm and pain and improve mobility STM  and IASTM with massage gun to L piriformis, gluteals and lateral leg and thigh   01/02/23 THERAPEUTIC EXERCISE: to improve flexibility, strength and mobility.  Demonstration, verbal and tactile cues throughout for technique. Supine L SKTC with pillowcase for extended reach x 30" Hooklying L KTOS piriformis stretch x 30" Hooklying L figure-4 piriformis stretch x 30" Seated L hip hinge HS stretch with foot elevated on trash can x 30"   PATIENT EDUCATION:  Education details: HEP update Person educated: Patient Education method: Explanation, Demonstration, Tactile cues, Verbal  cues, and Handouts Education comprehension: verbalized understanding, returned demonstration, verbal cues required, tactile cues required, and needs further education  HOME EXERCISE PROGRAM: Access Code: [email or web address] URL: [email or web address] Date: 01/15/2023 Prepared by: Glenetta Hew  Exercises - Hooklying Single Knee to Chest Stretch with Towel  - 2 x daily - 7 x weekly - 3 reps - 30 sec hold - Supine Piriformis Stretch with Foot on Ground  - 2 x daily - 7 x weekly - 3 reps - 30 sec hold - Supine Figure 4 Piriformis Stretch  - 2 x daily - 7 x weekly - 3 reps - 30 sec hold - Seated Hamstring Stretch  - 2 x daily - 7 x weekly - 3 reps - 30 sec hold - Prone Hip Extension - Two Pillows  - 1 x daily - 7 x weekly - 2 sets - 10 reps - 3 sec hold - Clamshell  - 1 x daily - 7 x weekly - 2 sets - 10 reps - 3 sec hold - Supine Hip Adduction Isometric with Ball  - 2 x daily - 7 x weekly - 2 sets - 10 reps - 5 sec hold  Patient Education - Trigger Point Dry Needling   ASSESSMENT:  CLINICAL IMPRESSION: Pt had increased pain yesterday from increased activity, she was doing better today. Continued with extension based exercises to reduce radicular pain and worked on manual therapy to address muscle tension in L glutes. Cues with prone hip extension to prevent pelvic rotation. She still is thinking about dry needling and would benefit from it.   OBJECTIVE IMPAIRMENTS: decreased activity tolerance, decreased endurance, decreased knowledge of condition, decreased mobility, difficulty walking, decreased ROM, decreased strength, hypomobility, increased fascial restrictions, impaired perceived functional ability, increased muscle spasms, impaired flexibility, impaired sensation, improper body mechanics, postural dysfunction, and pain.   ACTIVITY LIMITATIONS: carrying, lifting, bending, sitting, standing, squatting, sleeping, stairs, transfers, bed mobility, bathing, dressing, locomotion level,  and caring for others  PARTICIPATION LIMITATIONS: meal prep, cleaning, laundry, driving, shopping, community activity, and yard work  PERSONAL FACTORS: Age, Fitness, Past/current experiences, Time since onset of injury/illness/exacerbation, and 3+ comorbidities: Prediabetes/DM-II, thyroid disease, hx of endometrial CA treated with chemo, osteopenia, OA, HTN, HLD, GERD, BPPV, asthma, anxiety, depression, h/o flank pain and LBP with R LE radiculopathy  are also affecting patient's functional outcome.   REHAB POTENTIAL: Good  CLINICAL DECISION MAKING: Stable/uncomplicated  EVALUATION COMPLEXITY: Low   GOALS: Goals reviewed with patient? Yes  SHORT TERM GOALS: Target date: 01/23/2023  Patient will be independent with initial HEP to improve outcomes and carryover.  Baseline: Initial HEP provided on eval Goal status: MET- 01/18/23  2.  Patient will report centralization of radicular symptoms.  Baseline: Intermittent L LE pain, numbness and tingling down to foot Goal status: IN PROGRESS  LONG TERM GOALS: Target date: 02/13/2023  Patient will be independent with ongoing/advanced HEP for self-management at home.  Baseline:  Goal status: IN PROGRESS  2.  Patient will report 50-75% improvement in low back pain and L LE radiculopathy to improve QOL.  Baseline: 3-4/10 Goal status: IN PROGRESS  3.  Patient to demonstrate ability to achieve and maintain good spinal alignment/posturing and body mechanics needed for daily activities. Baseline:  Goal status: IN PROGRESS  4.  Patient will demonstrate functional pain free lumbar ROM to perform ADLs.   Baseline: Limited lumbar AROM in all planes except rotation, with pain for all motions except right lateral flexion - refer to above lumbar ROM table Goal status: IN PROGRESS  5.  Patient will demonstrate improved B LE strength to >/= 4+/5 for improved stability and ease of mobility . Baseline: Refer to above LE MMT table Goal status: IN  PROGRESS  6.  Patient will report >/= 55 on lumbar FOTO to demonstrate improved functional ability.  Baseline: 44 Goal status: IN PROGRESS   7. Patient will report </= 34% on Modified Oswestry to demonstrate improved functional ability with decreased pain interference. Baseline: 23 / 50 = 46.0 % Goal status: IN PROGRESS  8.  Patient will report >/= 35/80 on LEFS to demonstrate improved functional ability with decreased pain interference. Baseline: 26 / 80 = 32.5 % Goal status: IN PROGRESS  9.  Patient will tolerate 30+ min of standing or walking w/o increased pain to allow for improved mobility and activity tolerance. Baseline: Standing tolerance limited to <30 minutes and only able to walk short distances in home or yard without limitation due to pain Goal status: IN PROGRESS   PLAN:  PT FREQUENCY: 2x/week  PT DURATION: 6 weeks  PLANNED INTERVENTIONS: Therapeutic exercises, Therapeutic activity, Neuromuscular re-education, Balance training, Gait training, Patient/Family education, Self Care, Joint mobilization, Stair training, DME instructions, Aquatic Therapy, Dry Needling, Electrical stimulation, Spinal manipulation, Spinal mobilization, Cryotherapy, Moist heat, Taping, Traction, Ultrasound, Ionotophoresis 4mg /ml Dexamethasone, Manual therapy, and Re-evaluation  PLAN FOR NEXT SESSION: progress lumbar flexibility and strengthening - update HEP accordingly; MT +/- DN to address abnormal muscle tension lumbar paraspinals, glutes and piriformis   Gem Conkle L Lizandro Spellman, PTA  01/18/2023, 8:55 AM

## 2023-01-22 ENCOUNTER — Encounter: Payer: Self-pay | Admitting: Physical Therapy

## 2023-01-22 ENCOUNTER — Ambulatory Visit: Payer: Medicare Other | Attending: Family Medicine | Admitting: Physical Therapy

## 2023-01-22 DIAGNOSIS — M5459 Other low back pain: Secondary | ICD-10-CM | POA: Diagnosis not present

## 2023-01-22 DIAGNOSIS — M5416 Radiculopathy, lumbar region: Secondary | ICD-10-CM

## 2023-01-22 DIAGNOSIS — M6281 Muscle weakness (generalized): Secondary | ICD-10-CM | POA: Diagnosis not present

## 2023-01-22 DIAGNOSIS — R262 Difficulty in walking, not elsewhere classified: Secondary | ICD-10-CM | POA: Diagnosis not present

## 2023-01-22 NOTE — Therapy (Signed)
OUTPATIENT PHYSICAL THERAPY TREATMENT   Patient Name: Katelyn Fisher MRN: [id number] DOB:[birth year], 72 y.o., female Today's Date: 01/22/2023  END OF SESSION:  PT End of Session - 01/22/23 0805     Visit Number 5    Date for PT Re-Evaluation 02/13/23    Authorization Type [employer] & [employer]    Progress Note Due on Visit 10    PT Start Time 0805    PT Stop Time 0850    PT Time Calculation (min) 45 min    Activity Tolerance Patient tolerated treatment well    Behavior During Therapy [facility] for tasks assessed/performed               Past Medical History:  Diagnosis Date   Allergy    SEASONAL   Anxiety    Arthritis    back   Asthma    Depression    Endometrial cancer (HCC) 2015   Family history of breast cancer    Family history of colon cancer    Family history of ovarian cancer    GERD (gastroesophageal reflux disease)    History of colonic diverticulitis    Hx of colonic polyps    Hyperlipidemia    Hypertension    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   Osteopenia 09/2017   T score -1.2 FRAX 3.2% / 0.3%   Personal history of chemotherapy 2015   Radiation 12/17/14-01/03/15   vaginal brachytherapy   Thyroid disease 06/01/2013   Past Surgical History:  Procedure Laterality Date   ABDOMINAL HYSTERECTOMY  06/23/2014   [facility], TRH/BSO   CHOLECYSTECTOMY  1991   COLONOSCOPY     DILATION AND CURETTAGE OF UTERUS     IR REMOVAL TUN ACCESS W/ PORT W/O FL MOD SED  04/30/2017   POLYPECTOMY     TUBAL LIGATION     Patient Active Problem List   Diagnosis Date Noted   Acute cough 12/19/2022   Chronic rhinitis 08/21/2021   Mass of left elbow 09/14/2019   Flank pain 09/14/2019   Subacromial impingement of left shoulder 12/30/2018   Not well controlled moderate persistent asthma 08/07/2018   Current use of beta blocker 06/30/2018   Perennial allergic rhinitis 06/30/2018   Acute bronchitis due to other specified organisms 06/30/2018   History of endometrial cancer  02/03/2018   Screening Mammogram 11/05/2017   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   Type 2 diabetes mellitus with diabetic neuropathy, without long-term current use of insulin (HCC) 10/25/2016   Microcytosis 09/28/2016   Mild intermittent asthma without complication 01/26/2016   Port catheter in place 11/15/2015   Family history of breast cancer    Family history of ovarian cancer    Family history of prostate cancer    Family history of colon cancer    Morbid obesity (HCC) 11/09/2014   H/O total hysterectomy with bilateral salpingo-oophorectomy (BSO) 06/24/2014   Endometrial cancer (HCC) 05/20/2014   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   Hypothyroidism 06/01/2013   Low back pain radiating to right leg 08/18/2012   Anemia 11/07/2011   Hyperlipidemia 06/15/2011   Diabetes mellitus type 2 in obese 03/12/2011   Acute pain of right knee 01/27/2011   Obstructive sleep apnea 11/15/2010   Osteoarthritis 11/15/2010   Gastroesophageal reflux disease without esophagitis 09/14/2010   DIVERTICULITIS, HX OF 09/14/2010   POSTMENOPAUSAL STATUS 10/01/2008   COLONIC POLYPS, HX OF 03/10/2008   Essential hypertension 08/13/2006    PCP: Sharlene Dory, DO   REFERRING PROVIDER: Sharlene Dory,  DO  REFERRING DIAG:  M79.18 (ICD-10-CM) - Gluteal pain M54.50 (ICD-10-CM) - Left-sided low back pain without sciatica, unspecified chronicity  THERAPY DIAG:  Other low back pain  Radiculopathy, lumbar region  Muscle weakness (generalized)  Difficulty in walking, not elsewhere classified  RATIONALE FOR EVALUATION AND TREATMENT: Rehabilitation  ONSET DATE: February 2024  NEXT MD VISIT: 04/10/23   SUBJECTIVE:                                                                                                                                                                                                         SUBJECTIVE STATEMENT: Pt reports 40% improvement thus  far with better tolerance for activities around her home.  PAIN: Are you having pain? Yes: NPRS scale:  2/10 Pain location: L low back/buttock & intermittently down L LE into thigh only Pain description: dull in back, aching in L LE Aggravating factors: shifting weight onto L LE, carrying things on the L side, bending over, lower body dressing, crossing her legs Relieving factors: 2 wk benefit from injection, Tylenol, Voltaren gel, massage gun  PERTINENT HISTORY:  Prediabetes/DM-II, thyroid disease, hx of endometrial CA treated with chemo, osteopenia, OA, HTN, HLD, GERD, BPPV, asthma, anxiety, depression, h/o flank pain and LBP with R LE radiculopathy  PRECAUTIONS: None  WEIGHT BEARING RESTRICTIONS: No  FALLS:  Has patient fallen in last 6 months? No  LIVING ENVIRONMENT: Lives with: lives with their spouse Lives in: House/apartment Stairs: Yes: External: 1 steps; on left going up Has following equipment at home: Single point cane and Grab bars  OCCUPATION: Retired  PLOF: Independent and Leisure: read, Clinical cytogeneticist, walking around the house or in the backyard or short distances in the neighborhood  PATIENT GOALS: "To relieve the pain."   OBJECTIVE: (objective measures completed at initial evaluation unless otherwise dated)  DIAGNOSTIC FINDINGS:  10/10/19 - Lumbar MRI IMPRESSION: 1. Facet osteoarthritis at L3-4 and below with L4-5 and L5-S1 anterolisthesis. 2. Variable disc degeneration at L2-3 and below, advanced at L5-S1. 3. Spinal stenosis that is high-grade at L4-5 and moderate at L5-S1. 4. Moderate foraminal narrowing on the left at L4-5 and bilaterally at L5-S1.  PATIENT SURVEYS:  Modified Oswestry 23 / 50 = 46.0 %  LEFS 26 / 80 = 32.5 % FOTO : lumbar spine = 44, predicted = 55  SCREENING FOR RED FLAGS: Bowel or bladder incontinence: No Spinal tumors: No Cauda equina syndrome: No Compression fracture: No Abdominal aneurysm: No  COGNITION:  Overall cognitive  status: Within functional limits for tasks assessed    SENSATION: [facility]  Occasional numbness and tingling down L LE  MUSCLE LENGTH: Hamstrings: mild tightness L>R ITB: mod tight R>L Piriformis: mod tight R>L Hip flexors: mild tight B Quads: mild tight B Heelcord: NT  POSTURE:  rounded shoulders, forward head, increased lumbar lordosis, and anterior pelvic tilt  PALPATION: Increased muscle tension and TTP in L>R lumbar paraspinals, glutes, and piriformis  LUMBAR ROM:   Active  Eval *  Flexion Hands to mid shins  Extension 50% limited  Right lateral flexion Hand to lower thigh  Left lateral flexion Hand to lateral knee  Right rotation WFL  Left rotation WFL  (Blank rows = not tested, * = increased pain with all motions on eval except R lateral flexion)  LOWER EXTREMITY ROM:    Mildly limited hip ROM due to body habitus, but otherwise WFL  LOWER EXTREMITY MMT:    MMT Right eval Left eval  Hip flexion 4+ 4  Hip extension 3+ 2+  Hip abduction 4 4-  Hip adduction 4- 3-  Hip internal rotation 5 4  Hip external rotation 4- 3+  Knee flexion 5 5  Knee extension 5 5  Ankle dorsiflexion 4 4  Ankle plantarflexion    Ankle inversion    Ankle eversion     (Blank rows = not tested)  LUMBAR SPECIAL TESTS:  Straight leg raise test: Positive on left   TODAY'S TREATMENT:   01/22/23 THERAPEUTIC EXERCISE: to improve flexibility, strength and mobility.  Demonstration, verbal and tactile cues throughout for technique. Nustep L5 x 6 min Standing TrA + GTB rows 2 x 10 Standing TrA + GTB scap retraction/shoulder extension 2 x 10 Standing R/L GTB pallof press x 10 each Standing alt hip extension, leaning slightly forward on counter x 10 Seated TrA isometric + B UE press into blue/green striped ball in lap 10 x 5" Seated oblique isometric + alt contralateral UELE press into blue/green striped ball in lap 10 x 5" S/L RTB clam 2 x 10 bil Hooklying TrA/PPT + hip ADD ball squeeze  isometric 10 x 5" Bridge + hip ADD isometric 10 x 3" Hooklying TrA/PPT + GTB alt hip ABD/ER bent-knee fallout 10 x 5" Bridge + GTB hip ABD isometric 10 x 3"   01/18/23 THERAPEUTIC EXERCISE: to improve flexibility, strength and mobility.  Demonstration, verbal and tactile cues throughout for technique. Rec Bike L1x65min Prone lying with pillow under abdomen x 2 min Prone press ups 2x10 Prone knee flexion 2x12 bil Prone hip extension x 10 bil Supine L piriformis stretch 2x30 sec KTOS Supine pelvic tilts 10x5" Bridge with TrA x 10  Standing lumbar extension 10x3"  MANUAL THERAPY: To promote normalized muscle tension, improved flexibility, and reduced pain.  STM to L lateral glutes and piriformis   01/15/23 THERAPEUTIC EXERCISE: to improve flexibility, strength and mobility.  Demonstration, verbal and tactile cues throughout for technique. Nustep L5 x 6 min Prone over 2 pillows x 2 min before initiation of exercise and on rest breaks t/o prone exercises Prone over 2 pillows + alt hip ext 2 x 10 POE 2 x 1 min Prone press up 2 x 5 L S/L clam 2 x 10 Hooklying TrA isometrics 10 x 5" Hooklying PPT 10 x 5" Hooklying TrA/PPT + hip ADD ball squeeze isometric 10 x 5" Hooklying TrA/PPT + alt hip ABD/ER bent-knee fallout 10 x 5"  MANUAL THERAPY: To promote normalized muscle tension, improved flexibility, and reduced pain.  STM and IASTM with massage gun to L lateral glutes and piriformis  Discussed possible DN, but pt wishing to wait to see how she responds to other therapeutic interventions   PATIENT EDUCATION:  Education details: HEP update Person educated: Patient Education method: Explanation, Demonstration, Tactile cues, Verbal cues, and Handouts Education comprehension: verbalized understanding, returned demonstration, verbal cues required, tactile cues required, and needs further education  HOME EXERCISE PROGRAM: Access Code: [email or web address] URL:  [email or web address] Date: 01/22/2023 Prepared by: Glenetta Hew  Exercises - Hooklying Single Knee to Chest Stretch with Towel  - 2 x daily - 7 x weekly - 3 reps - 30 sec hold - Supine Piriformis Stretch with Foot on Ground  - 2 x daily - 7 x weekly - 3 reps - 30 sec hold - Supine Figure 4 Piriformis Stretch  - 2 x daily - 7 x weekly - 3 reps - 30 sec hold - Seated Hamstring Stretch  - 2 x daily - 7 x weekly - 3 reps - 30 sec hold - Prone Hip Extension - Two Pillows  - 1 x daily - 7 x weekly - 2 sets - 10 reps - 3 sec hold - Supine Hip Adduction Isometric with Ball  - 1 x daily - 7 x weekly - 2 sets - 10 reps - 5 sec hold - Clam with Resistance  - 1 x daily - 3 x weekly - 2 sets - 10 reps - 3-5 sec hold - Standing Bilateral Low Shoulder Row with Anchored Resistance  - 1 x daily - 3 x weekly - 2 sets - 10 reps - 5 sec hold - Shoulder extension with resistance - Neutral  - 1 x daily - 3 x weekly - 2 sets - 10 reps - 5 sec hold - Standing Anti-Rotation Press with Anchored Resistance  - 1 x daily - 3 x weekly - 2 sets - 10 reps - 3 sec hold  Patient Education - Trigger Point Dry Needling   ASSESSMENT:  CLINICAL IMPRESSION: Barrie reports 40% improvement in her pain thus far with improving tolerance for everyday activities around her home, but still notes some limitation with walking, especially on inclines.  Centralization of radicular symptoms noted, now no longer extending into foot, but still extending intermittently into L thigh - STG #2 met.  Continued to progress core/lumbopelvic strengthening introducing more upright activities to help promote increased standing and walking tolerance.  Good tolerance for exercise progression with some fatigue noted and occasional mild increased pain which typically resolved upon completion of triggering exercise. Pt requesting to add standing rows/retraction and pallof press to HEP as well as adding RTB resistance to S/L clams.  Danise will  continue to benefit from skilled PT to address ongoing pain, ROM and strength deficits to improve mobility and activity tolerance with decreased pain interference.   OBJECTIVE IMPAIRMENTS: decreased activity tolerance, decreased endurance, decreased knowledge of condition, decreased mobility, difficulty walking, decreased ROM, decreased strength, hypomobility, increased fascial restrictions, impaired perceived functional ability, increased muscle spasms, impaired flexibility, impaired sensation, improper body mechanics, postural dysfunction, and pain.   ACTIVITY LIMITATIONS: carrying, lifting, bending, sitting, standing, squatting, sleeping, stairs, transfers, bed mobility, bathing, dressing, locomotion level, and caring for others  PARTICIPATION LIMITATIONS: meal prep, cleaning, laundry, driving, shopping, community activity, and yard work  PERSONAL FACTORS: Age, Fitness, Past/current experiences, Time since onset of injury/illness/exacerbation, and 3+ comorbidities: Prediabetes/DM-II, thyroid disease, hx of endometrial CA treated with chemo, osteopenia, OA, HTN, HLD, GERD, BPPV, asthma, anxiety, depression, h/o flank pain and LBP with R LE radiculopathy  are also affecting  patient's functional outcome.   REHAB POTENTIAL: Good  CLINICAL DECISION MAKING: Stable/uncomplicated  EVALUATION COMPLEXITY: Low   GOALS: Goals reviewed with patient? Yes  SHORT TERM GOALS: Target date: 01/23/2023  Patient will be independent with initial HEP to improve outcomes and carryover.  Baseline: Initial HEP provided on eval Goal status: MET  01/18/23  2.  Patient will report centralization of radicular symptoms.  Baseline: Intermittent L LE pain, numbness and tingling down to foot Goal status: MET  01/22/23 - radicular pain now only intermittently into L thigh  LONG TERM GOALS: Target date: 02/13/2023  Patient will be independent with ongoing/advanced HEP for self-management at home.  Baseline:  Goal status:  IN PROGRESS  2.  Patient will report 50-75% improvement in low back pain and L LE radiculopathy to improve QOL.  Baseline: 3-4/10 Goal status: IN PROGRESS  3.  Patient to demonstrate ability to achieve and maintain good spinal alignment/posturing and body mechanics needed for daily activities. Baseline:  Goal status: IN PROGRESS  4.  Patient will demonstrate functional pain free lumbar ROM to perform ADLs.   Baseline: Limited lumbar AROM in all planes except rotation, with pain for all motions except right lateral flexion - refer to above lumbar ROM table Goal status: IN PROGRESS  5.  Patient will demonstrate improved B LE strength to >/= 4+/5 for improved stability and ease of mobility . Baseline: Refer to above LE MMT table Goal status: IN PROGRESS  6.  Patient will report >/= 55 on lumbar FOTO to demonstrate improved functional ability.  Baseline: 44 Goal status: IN PROGRESS   7. Patient will report </= 34% on Modified Oswestry to demonstrate improved functional ability with decreased pain interference. Baseline: 23 / 50 = 46.0 % Goal status: IN PROGRESS  8.  Patient will report >/= 35/80 on LEFS to demonstrate improved functional ability with decreased pain interference. Baseline: 26 / 80 = 32.5 % Goal status: IN PROGRESS  9.  Patient will tolerate 30+ min of standing or walking w/o increased pain to allow for improved mobility and activity tolerance. Baseline: Standing tolerance limited to <30 minutes and only able to walk short distances in home or yard without limitation due to pain Goal status: IN PROGRESS   PLAN:  PT FREQUENCY: 2x/week  PT DURATION: 6 weeks  PLANNED INTERVENTIONS: Therapeutic exercises, Therapeutic activity, Neuromuscular re-education, Balance training, Gait training, Patient/Family education, Self Care, Joint mobilization, Stair training, DME instructions, Aquatic Therapy, Dry Needling, Electrical stimulation, Spinal manipulation, Spinal  mobilization, Cryotherapy, Moist heat, Taping, Traction, Ultrasound, Ionotophoresis 4mg /ml Dexamethasone, Manual therapy, and Re-evaluation  PLAN FOR NEXT SESSION: progress lumbar flexibility and strengthening - update HEP accordingly; MT +/- DN to address abnormal muscle tension lumbar paraspinals, glutes and piriformis   Marry Guan, PT  01/22/2023, 8:11 AM

## 2023-01-28 ENCOUNTER — Other Ambulatory Visit (HOSPITAL_BASED_OUTPATIENT_CLINIC_OR_DEPARTMENT_OTHER): Payer: Self-pay

## 2023-01-28 ENCOUNTER — Ambulatory Visit: Payer: Medicare Other

## 2023-01-28 DIAGNOSIS — M5459 Other low back pain: Secondary | ICD-10-CM

## 2023-01-28 DIAGNOSIS — M6281 Muscle weakness (generalized): Secondary | ICD-10-CM | POA: Diagnosis not present

## 2023-01-28 DIAGNOSIS — M5416 Radiculopathy, lumbar region: Secondary | ICD-10-CM

## 2023-01-28 DIAGNOSIS — R262 Difficulty in walking, not elsewhere classified: Secondary | ICD-10-CM | POA: Diagnosis not present

## 2023-01-28 MED ORDER — BETAMETHASONE DIPROPIONATE 0.05 % EX OINT
1.0000 "application " | TOPICAL_OINTMENT | Freq: Every day | CUTANEOUS | 0 refills | Status: DC | PRN
  Filled 2023-01-28: qty 90, 30d supply, fill #0

## 2023-01-28 NOTE — Therapy (Signed)
OUTPATIENT PHYSICAL THERAPY TREATMENT   Patient Name: Katelyn Fisher MRN: [id number] DOB:1950/11/06, 72 y.o., female Today's Date: 01/28/2023  END OF SESSION:  PT End of Session - 01/28/23 0803     Visit Number 6    Date for PT Re-Evaluation 02/13/23    Authorization Type [employer] & [employer]    Progress Note Due on Visit 10    PT Start Time 0800    PT Stop Time 0846    PT Time Calculation (min) 46 min    Activity Tolerance Patient tolerated treatment well    Behavior During Therapy WFL for tasks assessed/performed                Past Medical History:  Diagnosis Date   Allergy    SEASONAL   Anxiety    Arthritis    back   Asthma    Depression    Endometrial cancer (HCC) 2015   Family history of breast cancer    Family history of colon cancer    Family history of ovarian cancer    GERD (gastroesophageal reflux disease)    History of colonic diverticulitis    Hx of colonic polyps    Hyperlipidemia    Hypertension    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   Osteopenia 09/2017   T score -1.2 FRAX 3.2% / 0.3%   Personal history of chemotherapy 2015   Radiation 12/17/14-01/03/15   vaginal brachytherapy   Thyroid disease 06/01/2013   Past Surgical History:  Procedure Laterality Date   ABDOMINAL HYSTERECTOMY  06/23/2014   [facility], TRH/BSO   CHOLECYSTECTOMY  1991   COLONOSCOPY     DILATION AND CURETTAGE OF UTERUS     IR REMOVAL TUN ACCESS W/ PORT W/O FL MOD SED  04/30/2017   POLYPECTOMY     TUBAL LIGATION     Patient Active Problem List   Diagnosis Date Noted   Acute cough 12/19/2022   Chronic rhinitis 08/21/2021   Mass of left elbow 09/14/2019   Flank pain 09/14/2019   Subacromial impingement of left shoulder 12/30/2018   Not well controlled moderate persistent asthma 08/07/2018   Current use of beta blocker 06/30/2018   Perennial allergic rhinitis 06/30/2018   Acute bronchitis due to other specified organisms 06/30/2018   History of endometrial cancer  02/03/2018   Screening Mammogram 11/05/2017   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   Type 2 diabetes mellitus with diabetic neuropathy, without long-term current use of insulin (HCC) 10/25/2016   Microcytosis 09/28/2016   Mild intermittent asthma without complication 01/26/2016   Port catheter in place 11/15/2015   Family history of breast cancer    Family history of ovarian cancer    Family history of prostate cancer    Family history of colon cancer    Morbid obesity (HCC) 11/09/2014   H/O total hysterectomy with bilateral salpingo-oophorectomy (BSO) 06/24/2014   Endometrial cancer (HCC) 05/20/2014   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   Hypothyroidism 06/01/2013   Low back pain radiating to right leg 08/18/2012   Anemia 11/07/2011   Hyperlipidemia 06/15/2011   Diabetes mellitus type 2 in obese 03/12/2011   Acute pain of right knee 01/27/2011   Obstructive sleep apnea 11/15/2010   Osteoarthritis 11/15/2010   Gastroesophageal reflux disease without esophagitis 09/14/2010   DIVERTICULITIS, HX OF 09/14/2010   POSTMENOPAUSAL STATUS 10/01/2008   COLONIC POLYPS, HX OF 03/10/2008   Essential hypertension 08/13/2006    PCP: Sharlene Dory, DO   REFERRING PROVIDER: Arva Chafe  Renae Fickle, DO  REFERRING DIAG:  M79.18 (ICD-10-CM) - Gluteal pain M54.50 (ICD-10-CM) - Left-sided low back pain without sciatica, unspecified chronicity  THERAPY DIAG:  Other low back pain  Radiculopathy, lumbar region  Muscle weakness (generalized)  Difficulty in walking, not elsewhere classified  RATIONALE FOR EVALUATION AND TREATMENT: Rehabilitation  ONSET DATE: February 2024  NEXT MD VISIT: 04/10/23   SUBJECTIVE:                                                                                                                                                                                                         SUBJECTIVE STATEMENT: Pt reports she had very active  over the week with her grand daughter but she was able to do everything with little pain.  PAIN: Are you having pain? Yes: NPRS scale:  2/10 Pain location: L low back/buttock & intermittently down L LE into thigh only Pain description: dull in back, aching in L LE Aggravating factors: shifting weight onto L LE, carrying things on the L side, bending over, lower body dressing, crossing her legs Relieving factors: 2 wk benefit from injection, Tylenol, Voltaren gel, massage gun  PERTINENT HISTORY:  Prediabetes/DM-II, thyroid disease, hx of endometrial CA treated with chemo, osteopenia, OA, HTN, HLD, GERD, BPPV, asthma, anxiety, depression, h/o flank pain and LBP with R LE radiculopathy  PRECAUTIONS: None  WEIGHT BEARING RESTRICTIONS: No  FALLS:  Has patient fallen in last 6 months? No  LIVING ENVIRONMENT: Lives with: lives with their spouse Lives in: House/apartment Stairs: Yes: External: 1 steps; on left going up Has following equipment at home: Single point cane and Grab bars  OCCUPATION: Retired  PLOF: Independent and Leisure: read, Clinical cytogeneticist, walking around the house or in the backyard or short distances in the neighborhood  PATIENT GOALS: "To relieve the pain."   OBJECTIVE: (objective measures completed at initial evaluation unless otherwise dated)  DIAGNOSTIC FINDINGS:  10/10/19 - Lumbar MRI IMPRESSION: 1. Facet osteoarthritis at L3-4 and below with L4-5 and L5-S1 anterolisthesis. 2. Variable disc degeneration at L2-3 and below, advanced at L5-S1. 3. Spinal stenosis that is high-grade at L4-5 and moderate at L5-S1. 4. Moderate foraminal narrowing on the left at L4-5 and bilaterally at L5-S1.  PATIENT SURVEYS:  Modified Oswestry 23 / 50 = 46.0 %  LEFS 26 / 80 = 32.5 % FOTO : lumbar spine = 44, predicted = 55  SCREENING FOR RED FLAGS: Bowel or bladder incontinence: No Spinal tumors: No Cauda equina syndrome: No Compression fracture: No Abdominal aneurysm:  No  COGNITION:  Overall cognitive status: Within  functional limits for tasks assessed    SENSATION: WFL Occasional numbness and tingling down L LE  MUSCLE LENGTH: Hamstrings: mild tightness L>R ITB: mod tight R>L Piriformis: mod tight R>L Hip flexors: mild tight B Quads: mild tight B Heelcord: NT  POSTURE:  rounded shoulders, forward head, increased lumbar lordosis, and anterior pelvic tilt  PALPATION: Increased muscle tension and TTP in L>R lumbar paraspinals, glutes, and piriformis  LUMBAR ROM:   Active  Eval *  Flexion Hands to mid shins  Extension 50% limited  Right lateral flexion Hand to lower thigh  Left lateral flexion Hand to lateral knee  Right rotation WFL  Left rotation WFL  (Blank rows = not tested, * = increased pain with all motions on eval except R lateral flexion)  LOWER EXTREMITY ROM:    Mildly limited hip ROM due to body habitus, but otherwise WFL  LOWER EXTREMITY MMT:    MMT Right eval Left eval  Hip flexion 4+ 4  Hip extension 3+ 2+  Hip abduction 4 4-  Hip adduction 4- 3-  Hip internal rotation 5 4  Hip external rotation 4- 3+  Knee flexion 5 5  Knee extension 5 5  Ankle dorsiflexion 4 4  Ankle plantarflexion    Ankle inversion    Ankle eversion     (Blank rows = not tested)  LUMBAR SPECIAL TESTS:  Straight leg raise test: Positive on left   TODAY'S TREATMENT:  01/28/23 THERAPEUTIC EXERCISE: to improve flexibility, strength and mobility.  Demonstration, verbal and tactile cues throughout for technique. Nustep L4 x 6 min Standing TrA + GTB rows x 20 Standing TrA + GTB scap retraction/shoulder extension x 20 Standing R/L GTB pallof press x 20 each Supine bridge GTB with TrA 2x10 S/L clamshells bil GTB 10x3" S/L hip abduction x 10 R; 2x5 L side Supine LTR 5x5" bil Supine figure 4 stretch 2x30 sec  Supine SKTC L with towel 2x30 sec   01/22/23 THERAPEUTIC EXERCISE: to improve flexibility, strength and mobility.  Demonstration,  verbal and tactile cues throughout for technique. Nustep L5 x 6 min Standing TrA + GTB rows 2 x 10 Standing TrA + GTB scap retraction/shoulder extension 2 x 10 Standing R/L GTB pallof press x 10 each Standing alt hip extension, leaning slightly forward on counter x 10 Seated TrA isometric + B UE press into blue/green striped ball in lap 10 x 5" Seated oblique isometric + alt contralateral UELE press into blue/green striped ball in lap 10 x 5" S/L RTB clam 2 x 10 bil Hooklying TrA/PPT + hip ADD ball squeeze isometric 10 x 5" Bridge + hip ADD isometric 10 x 3" Hooklying TrA/PPT + GTB alt hip ABD/ER bent-knee fallout 10 x 5" Bridge + GTB hip ABD isometric 10 x 3"   01/18/23 THERAPEUTIC EXERCISE: to improve flexibility, strength and mobility.  Demonstration, verbal and tactile cues throughout for technique. Rec Bike L1x79min Prone lying with pillow under abdomen x 2 min Prone press ups 2x10 Prone knee flexion 2x12 bil Prone hip extension x 10 bil Supine L piriformis stretch 2x30 sec KTOS Supine pelvic tilts 10x5" Bridge with TrA x 10  Standing lumbar extension 10x3"  MANUAL THERAPY: To promote normalized muscle tension, improved flexibility, and reduced pain.  STM to L lateral glutes and piriformis     PATIENT EDUCATION:  Education details: HEP update- added green TB to clamshells  Person educated: Patient Education method: Explanation, Demonstration, Tactile cues, Verbal cues, and Handouts Education comprehension: verbalized understanding,  returned demonstration, verbal cues required, tactile cues required, and needs further education  HOME EXERCISE PROGRAM: Access Code: [email or web address] URL: [email or web address] Date: 01/22/2023 Prepared by: Glenetta Hew  Exercises - Hooklying Single Knee to Chest Stretch with Towel  - 2 x daily - 7 x weekly - 3 reps - 30 sec hold - Supine Piriformis Stretch with Foot on Ground  - 2 x daily - 7 x weekly - 3 reps - 30 sec hold -  Supine Figure 4 Piriformis Stretch  - 2 x daily - 7 x weekly - 3 reps - 30 sec hold - Seated Hamstring Stretch  - 2 x daily - 7 x weekly - 3 reps - 30 sec hold - Prone Hip Extension - Two Pillows  - 1 x daily - 7 x weekly - 2 sets - 10 reps - 3 sec hold - Supine Hip Adduction Isometric with Ball  - 1 x daily - 7 x weekly - 2 sets - 10 reps - 5 sec hold - Clam with Resistance  - 1 x daily - 3 x weekly - 2 sets - 10 reps - 3-5 sec hold - Standing Bilateral Low Shoulder Row with Anchored Resistance  - 1 x daily - 3 x weekly - 2 sets - 10 reps - 5 sec hold - Shoulder extension with resistance - Neutral  - 1 x daily - 3 x weekly - 2 sets - 10 reps - 5 sec hold - Standing Anti-Rotation Press with Anchored Resistance  - 1 x daily - 3 x weekly - 2 sets - 10 reps - 3 sec hold  Patient Education - Trigger Point Dry Needling   ASSESSMENT:  CLINICAL IMPRESSION: Pt now reports her pain is mostly in her low back rather than going down LLE. Progressed lumbo-pelvic exercises for strength, mobility, and posture. Cues needed with clamshells for proper movement. Pt showed the most difficulty with S/L hip abduction on L side. She completed all interventions with no increased pain but with difficulty d/t increased muscle engagement. She is going on vacation next week after appointment on Wednesday.  OBJECTIVE IMPAIRMENTS: decreased activity tolerance, decreased endurance, decreased knowledge of condition, decreased mobility, difficulty walking, decreased ROM, decreased strength, hypomobility, increased fascial restrictions, impaired perceived functional ability, increased muscle spasms, impaired flexibility, impaired sensation, improper body mechanics, postural dysfunction, and pain.   ACTIVITY LIMITATIONS: carrying, lifting, bending, sitting, standing, squatting, sleeping, stairs, transfers, bed mobility, bathing, dressing, locomotion level, and caring for others  PARTICIPATION LIMITATIONS: meal prep, cleaning,  laundry, driving, shopping, community activity, and yard work  PERSONAL FACTORS: Age, Fitness, Past/current experiences, Time since onset of injury/illness/exacerbation, and 3+ comorbidities: Prediabetes/DM-II, thyroid disease, hx of endometrial CA treated with chemo, osteopenia, OA, HTN, HLD, GERD, BPPV, asthma, anxiety, depression, h/o flank pain and LBP with R LE radiculopathy  are also affecting patient's functional outcome.   REHAB POTENTIAL: Good  CLINICAL DECISION MAKING: Stable/uncomplicated  EVALUATION COMPLEXITY: Low   GOALS: Goals reviewed with patient? Yes  SHORT TERM GOALS: Target date: 01/23/2023  Patient will be independent with initial HEP to improve outcomes and carryover.  Baseline: Initial HEP provided on eval Goal status: MET  01/18/23  2.  Patient will report centralization of radicular symptoms.  Baseline: Intermittent L LE pain, numbness and tingling down to foot Goal status: MET  01/22/23 - radicular pain now only intermittently into L thigh  LONG TERM GOALS: Target date: 02/13/2023  Patient will be independent with ongoing/advanced HEP for self-management at home.  Baseline:  Goal status: IN PROGRESS  2.  Patient will report 50-75% improvement in low back pain and L LE radiculopathy to improve QOL.  Baseline: 3-4/10 Goal status: IN PROGRESS  3.  Patient to demonstrate ability to achieve and maintain good spinal alignment/posturing and body mechanics needed for daily activities. Baseline:  Goal status: IN PROGRESS  4.  Patient will demonstrate functional pain free lumbar ROM to perform ADLs.   Baseline: Limited lumbar AROM in all planes except rotation, with pain for all motions except right lateral flexion - refer to above lumbar ROM table Goal status: IN PROGRESS  5.  Patient will demonstrate improved B LE strength to >/= 4+/5 for improved stability and ease of mobility . Baseline: Refer to above LE MMT table Goal status: IN PROGRESS  6.  Patient will  report >/= 55 on lumbar FOTO to demonstrate improved functional ability.  Baseline: 44 Goal status: IN PROGRESS   7. Patient will report </= 34% on Modified Oswestry to demonstrate improved functional ability with decreased pain interference. Baseline: 23 / 50 = 46.0 % Goal status: IN PROGRESS  8.  Patient will report >/= 35/80 on LEFS to demonstrate improved functional ability with decreased pain interference. Baseline: 26 / 80 = 32.5 % Goal status: IN PROGRESS  9.  Patient will tolerate 30+ min of standing or walking w/o increased pain to allow for improved mobility and activity tolerance. Baseline: Standing tolerance limited to <30 minutes and only able to walk short distances in home or yard without limitation due to pain Goal status: IN PROGRESS   PLAN:  PT FREQUENCY: 2x/week  PT DURATION: 6 weeks  PLANNED INTERVENTIONS: Therapeutic exercises, Therapeutic activity, Neuromuscular re-education, Balance training, Gait training, Patient/Family education, Self Care, Joint mobilization, Stair training, DME instructions, Aquatic Therapy, Dry Needling, Electrical stimulation, Spinal manipulation, Spinal mobilization, Cryotherapy, Moist heat, Taping, Traction, Ultrasound, Ionotophoresis 4mg /ml Dexamethasone, Manual therapy, and Re-evaluation  PLAN FOR NEXT SESSION: progress lumbar flexibility and strengthening - update HEP accordingly; MT +/- DN to address abnormal muscle tension lumbar paraspinals, glutes and piriformis   Leisl Spurrier L Loyed Wilmes, PTA  01/28/2023, 8:55 AM

## 2023-01-29 ENCOUNTER — Other Ambulatory Visit: Payer: Self-pay

## 2023-01-29 ENCOUNTER — Encounter: Payer: Medicare Other | Admitting: Physical Therapy

## 2023-01-29 ENCOUNTER — Encounter: Payer: Self-pay | Admitting: Family Medicine

## 2023-01-29 ENCOUNTER — Ambulatory Visit (INDEPENDENT_AMBULATORY_CARE_PROVIDER_SITE_OTHER): Payer: Medicare Other | Admitting: Family Medicine

## 2023-01-29 ENCOUNTER — Other Ambulatory Visit (HOSPITAL_BASED_OUTPATIENT_CLINIC_OR_DEPARTMENT_OTHER): Payer: Self-pay

## 2023-01-29 VITALS — BP 130/76 | HR 81 | Temp 97.7°F | Resp 20 | Ht 61.0 in | Wt 232.0 lb

## 2023-01-29 DIAGNOSIS — K219 Gastro-esophageal reflux disease without esophagitis: Secondary | ICD-10-CM

## 2023-01-29 DIAGNOSIS — T7800XD Anaphylactic reaction due to unspecified food, subsequent encounter: Secondary | ICD-10-CM

## 2023-01-29 DIAGNOSIS — J3089 Other allergic rhinitis: Secondary | ICD-10-CM | POA: Diagnosis not present

## 2023-01-29 DIAGNOSIS — J4540 Moderate persistent asthma, uncomplicated: Secondary | ICD-10-CM | POA: Diagnosis not present

## 2023-01-29 MED ORDER — LEVOCETIRIZINE DIHYDROCHLORIDE 5 MG PO TABS
5.0000 mg | ORAL_TABLET | Freq: Every evening | ORAL | 2 refills | Status: AC
  Filled 2023-01-29: qty 90, 90d supply, fill #0

## 2023-01-29 MED ORDER — ALBUTEROL SULFATE HFA 108 (90 BASE) MCG/ACT IN AERS
2.0000 | INHALATION_SPRAY | RESPIRATORY_TRACT | 1 refills | Status: DC | PRN
  Filled 2023-01-29: qty 6.7, 20d supply, fill #0

## 2023-01-29 NOTE — Progress Notes (Signed)
[address] [place] [address] Dept: [phone]  FOLLOW UP NOTE  Patient ID: Katelyn Fisher, female    DOB: Dec 28, 1950  Age: 72 y.o. MRN: [id number] Date of Office Visit: 01/29/2023  Assessment  Chief Complaint: Follow-up (I month follow up)  HPI Katelyn Fisher is a 72 year old female who presents to the clinic for follow-up visit.  She was last seen in this clinic on 12/19/2022 by Thermon Leyland, FNP, for asthma with flare requiring prednisone, allergic rhinitis, reflux, and food allergy to shellfish.    At today's visit, she reports her asthma has been well-controlled with no shortness of breath or wheeze with activity or rest.  She does report infrequent cough producing occasional clear thin mucus.  She continues montelukast 10 mg once a week and is using albuterol about once a week or once every other week.  She continues Symbicort about 2 puffs once a week.  We had a detailed discussion regarding the mechanism of action of Symbicort and albuterol.    Allergic rhinitis is reported as moderately well-controlled with slight postnasal drainage as the main symptom.  She continues levocetirizine 5 mg once a day and uses Flonase only as needed.  She is not currently using a nasal saline rinse.  Her last environmental allergy testing was on 05/25/2020 and was positive to dust mite and negative to the remainder of the adult panel.  She continues to avoid shellfish with no accidental ingestion or EpiPen use since her last visit to this clinic.  Her current medications are listed in the chart.  Drug Allergies:  Allergies  Allergen Reactions   Shellfish Allergy Hives, Shortness Of Breath and Swelling   Oxycodone Swelling    Facial swelling and tightness   Penicillins Swelling    Rash with welps   Iodinated Contrast Media Rash   Sulfa Antibiotics Rash    Physical Exam: BP 130/76 (BP Location: Left Arm, Patient Position: Sitting, Cuff Size: Large)   Pulse 81   Temp 97.7 F (36.5  C) (Temporal)   Resp 20   Ht 5\' 1"  (1.549 m)   Wt 232 lb (105.2 kg)   SpO2 99%   BMI 43.84 kg/m    Physical Exam Vitals reviewed.  Constitutional:      Appearance: Normal appearance.  HENT:     Head: Normocephalic and atraumatic.     Right Ear: Tympanic membrane normal.     Left Ear: Tympanic membrane normal.     Nose:     Comments: Bilateral nares slightly erythematous with clear nasal drainage noted.  Pharynx normal.  Ears normal.  Eyes normal.    Mouth/Throat:     Pharynx: Oropharynx is clear.  Eyes:     Conjunctiva/sclera: Conjunctivae normal.  Cardiovascular:     Rate and Rhythm: Normal rate and regular rhythm.     Heart sounds: Normal heart sounds. No murmur heard. Pulmonary:     Effort: Pulmonary effort is normal.     Breath sounds: Normal breath sounds.     Comments: Lungs clear to auscultation Musculoskeletal:        General: Normal range of motion.     Cervical back: Normal range of motion and neck supple.  Skin:    General: Skin is warm and dry.  Neurological:     Mental Status: She is alert and oriented to person, place, and time.  Psychiatric:        Mood and Affect: Mood normal.  Behavior: Behavior normal.        Thought Content: Thought content normal.        Judgment: Judgment normal.     Diagnostics: FVC 1.44, FEV1 1.15.  Predicted FVC 2.16, predicted FEV1 1.70.  Spirometry indicates moderate restriction.  This is consistent with previous spirometry readings.  Assessment and Plan: 1. Moderate persistent asthma without complication   2. Perennial allergic rhinitis   3. Gastroesophageal reflux disease without esophagitis   4. Anaphylactic shock due to food, subsequent encounter     Meds ordered this encounter  Medications   albuterol (PROAIR HFA) 108 (90 Base) MCG/ACT inhaler    Sig: Inhale 2 puffs by mouth into the lungs every 4 (four) hours as needed for coughing or wheezing spells.    Dispense:  6.7 g    Refill:  1   levocetirizine  (XYZAL) 5 MG tablet    Sig: Take 1 tablet (5 mg total) by mouth every evening.    Dispense:  90 tablet    Refill:  2    Patient Instructions  Asthma Continue montelukast (Singulair) 10 mg once a day to prevent cough or wheeze Continue albuterol 2 puffs every 4 hours as needed for cough or wheeze OR Instead use albuterol 0.083% solution via nebulizer one unit vial every 4 hours as needed for cough or wheeze  You may use albuterol 2 puffs 5 to 15 minutes before activity to decrease cough or wheeze For asthma flare, begin Symbicort 160-2 puffs twice a day for 1 to 2 weeks or until cough and wheeze free  Allergic rhinitis Continue allergen avoidance measures directed toward dust mites as listed below Continue flonase nasal spray 1-2 sprays each nostril once a day as needed for stuffy nose.  In the right nostril, point the applicator out toward the right ear. In the left nostril, point the applicator out toward the left ear Continue levocetirizine 5 mg one tablet once a day as needed for runny nose or itching May use saline nasal rinses as needed For thick post nasal drainage, begin Mucinex [phone] mg twice a day for about 1 week Consider updating your environmental allergy testing.  Remember to stop antihistamines for 3 days before your skin testing appointment.  Reflux Continue dietary and lifestyle modifications as listed below  Food allergy Continue to avoid shellfish.  In case of an allergic reaction, give Benadryl 50 mg  every 4 hours, and if life-threatening symptoms occur, inject with EpiPen 0.3 mg. Return to the clinic if you are interested in updating your food allergy testing.  Remember to stop antihistamines for 3 days before your food testing appointment.  Call the clinic if this treatment plan is not working well for you  Follow up in 6 months or sooner if needed.   Return in about 6 months (around 08/01/2023), or if symptoms worsen or fail to improve.    Thank you for the  opportunity to care for this patient.  Please do not hesitate to contact me with questions.  Thermon Leyland, FNP [facility]

## 2023-01-29 NOTE — Patient Instructions (Addendum)
Asthma Continue montelukast (Singulair) 10 mg once a day to prevent cough or wheeze Continue albuterol 2 puffs every 4 hours as needed for cough or wheeze OR Instead use albuterol 0.083% solution via nebulizer one unit vial every 4 hours as needed for cough or wheeze  You may use albuterol 2 puffs 5 to 15 minutes before activity to decrease cough or wheeze For asthma flare, begin Symbicort 160-2 puffs twice a day for 1 to 2 weeks or until cough and wheeze free  Allergic rhinitis Continue allergen avoidance measures directed toward dust mites as listed below Continue flonase nasal spray 1-2 sprays each nostril once a day as needed for stuffy nose.  In the right nostril, point the applicator out toward the right ear. In the left nostril, point the applicator out toward the left ear Continue levocetirizine 5 mg one tablet once a day as needed for runny nose or itching May use saline nasal rinses as needed For thick post nasal drainage, begin Mucinex [phone] mg twice a day for about 1 week Consider updating your environmental allergy testing.  Remember to stop antihistamines for 3 days before your skin testing appointment.  Reflux Continue dietary and lifestyle modifications as listed below  Food allergy Continue to avoid shellfish.  In case of an allergic reaction, give Benadryl 50 mg  every 4 hours, and if life-threatening symptoms occur, inject with EpiPen 0.3 mg. Return to the clinic if you are interested in updating your food allergy testing.  Remember to stop antihistamines for 3 days before your food testing appointment.  Call the clinic if this treatment plan is not working well for you  Follow up in 6 months or sooner if needed.   Control of Dust Mite Allergen Dust mites play a major role in allergic asthma and rhinitis. They occur in environments with high humidity wherever human skin is found. Dust mites absorb humidity from the atmosphere (ie, they do not drink) and feed on  organic matter (including shed human and animal skin). Dust mites are a microscopic type of insect that you cannot see with the naked eye. High levels of dust mites have been detected from mattresses, pillows, carpets, upholstered furniture, bed covers, clothes, soft toys and any woven material. The principal allergen of the dust mite is found in its feces. A gram of dust may contain 1,000 mites and 250,000 fecal particles. Mite antigen is easily measured in the air during house cleaning activities. Dust mites do not bite and do not cause harm to humans, other than by triggering allergies/asthma.  Ways to decrease your exposure to dust mites in your home:  1. Encase mattresses, box springs and pillows with a mite-impermeable barrier or cover  2. Wash sheets, blankets and drapes weekly in hot water (130 F) with detergent and dry them in a dryer on the hot setting.  3. Have the room cleaned frequently with a vacuum cleaner and a damp dust-mop. For carpeting or rugs, vacuuming with a vacuum cleaner equipped with a high-efficiency particulate air (HEPA) filter. The dust mite allergic individual should not be in a room which is being cleaned and should wait 1 hour after cleaning before going into the room.  4. Do not sleep on upholstered furniture (eg, couches).  5. If possible removing carpeting, upholstered furniture and drapery from the home is ideal. Horizontal blinds should be eliminated in the rooms where the person spends the most time (bedroom, study, television room). Washable vinyl, roller-type shades are optimal.  6.  Remove all non-washable stuffed toys from the bedroom. Wash stuffed toys weekly like sheets and blankets above.  7. Reduce indoor humidity to less than 50%. Inexpensive humidity monitors can be purchased at most hardware stores. Do not use a humidifier as can make the problem worse and are not recommended.

## 2023-01-30 ENCOUNTER — Encounter: Payer: Self-pay | Admitting: Physical Therapy

## 2023-01-30 ENCOUNTER — Ambulatory Visit: Payer: Medicare Other | Admitting: Physical Therapy

## 2023-01-30 ENCOUNTER — Encounter: Payer: Self-pay | Admitting: Family Medicine

## 2023-01-30 DIAGNOSIS — M6281 Muscle weakness (generalized): Secondary | ICD-10-CM | POA: Diagnosis not present

## 2023-01-30 DIAGNOSIS — R262 Difficulty in walking, not elsewhere classified: Secondary | ICD-10-CM

## 2023-01-30 DIAGNOSIS — M5416 Radiculopathy, lumbar region: Secondary | ICD-10-CM | POA: Diagnosis not present

## 2023-01-30 DIAGNOSIS — M5459 Other low back pain: Secondary | ICD-10-CM | POA: Diagnosis not present

## 2023-01-30 NOTE — Therapy (Signed)
OUTPATIENT PHYSICAL THERAPY TREATMENT   Patient Name: NEGIN HEGG MRN: [id number] DOB:02/01/1951, 72 y.o., female Today's Date: 01/30/2023  END OF SESSION:  PT End of Session - 01/30/23 0802     Visit Number 7    Date for PT Re-Evaluation 02/13/23    Authorization Type [employer] & [employer]    Progress Note Due on Visit 10    PT Start Time 0802    PT Stop Time 0847    PT Time Calculation (min) 45 min    Activity Tolerance Patient tolerated treatment well    Behavior During Therapy [facility] for tasks assessed/performed                Past Medical History:  Diagnosis Date   Allergy    SEASONAL   Anxiety    Arthritis    back   Asthma    Depression    Endometrial cancer (HCC) 2015   Family history of breast cancer    Family history of colon cancer    Family history of ovarian cancer    GERD (gastroesophageal reflux disease)    History of colonic diverticulitis    Hx of colonic polyps    Hyperlipidemia    Hypertension    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   Osteopenia 09/2017   T score -1.2 FRAX 3.2% / 0.3%   Personal history of chemotherapy 2015   Radiation 12/17/14-01/03/15   vaginal brachytherapy   Thyroid disease 06/01/2013   Past Surgical History:  Procedure Laterality Date   ABDOMINAL HYSTERECTOMY  06/23/2014   [facility], TRH/BSO   CHOLECYSTECTOMY  1991   COLONOSCOPY     DILATION AND CURETTAGE OF UTERUS     IR REMOVAL TUN ACCESS W/ PORT W/O FL MOD SED  04/30/2017   POLYPECTOMY     TUBAL LIGATION     Patient Active Problem List   Diagnosis Date Noted   Acute cough 12/19/2022   Chronic rhinitis 08/21/2021   Mass of left elbow 09/14/2019   Flank pain 09/14/2019   Subacromial impingement of left shoulder 12/30/2018   Moderate persistent asthma without complication 08/07/2018   Current use of beta blocker 06/30/2018   Perennial allergic rhinitis 06/30/2018   Acute bronchitis due to other specified organisms 06/30/2018   History of endometrial cancer  02/03/2018   Screening Mammogram 11/05/2017   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   Type 2 diabetes mellitus with diabetic neuropathy, without long-term current use of insulin (HCC) 10/25/2016   Microcytosis 09/28/2016   Mild intermittent asthma without complication 01/26/2016   Port catheter in place 11/15/2015   Family history of breast cancer    Family history of ovarian cancer    Family history of prostate cancer    Family history of colon cancer    Morbid obesity (HCC) 11/09/2014   H/O total hysterectomy with bilateral salpingo-oophorectomy (BSO) 06/24/2014   Endometrial cancer (HCC) 05/20/2014   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   Hypothyroidism 06/01/2013   Low back pain radiating to right leg 08/18/2012   Anemia 11/07/2011   Hyperlipidemia 06/15/2011   Diabetes mellitus type 2 in obese 03/12/2011   Acute pain of right knee 01/27/2011   Obstructive sleep apnea 11/15/2010   Osteoarthritis 11/15/2010   Gastroesophageal reflux disease without esophagitis 09/14/2010   DIVERTICULITIS, HX OF 09/14/2010   POSTMENOPAUSAL STATUS 10/01/2008   COLONIC POLYPS, HX OF 03/10/2008   Essential hypertension 08/13/2006    PCP: Sharlene Dory, DO   REFERRING PROVIDER: Sharlene Dory,  DO  REFERRING DIAG:  M79.18 (ICD-10-CM) - Gluteal pain M54.50 (ICD-10-CM) - Left-sided low back pain without sciatica, unspecified chronicity  THERAPY DIAG:  Other low back pain  Radiculopathy, lumbar region  Muscle weakness (generalized)  Difficulty in walking, not elsewhere classified  RATIONALE FOR EVALUATION AND TREATMENT: Rehabilitation  ONSET DATE: February 2024  NEXT MD VISIT: 04/10/23   SUBJECTIVE:                                                                                                                                                                                                         SUBJECTIVE STATEMENT: Pt reports pain mostly in her L  thigh this morning, although usually only in her back recently.  PAIN: Are you having pain? Yes: NPRS scale:  3/10 Pain location: L thigh  Pain description: aching  Aggravating factors: shifting weight onto L LE, carrying things on the L side, bending over, lower body dressing, crossing her legs Relieving factors: 2 wk benefit from injection, Tylenol, Voltaren gel, massage gun  PERTINENT HISTORY:  Prediabetes/DM-II, thyroid disease, hx of endometrial CA treated with chemo, osteopenia, OA, HTN, HLD, GERD, BPPV, asthma, anxiety, depression, h/o flank pain and LBP with R LE radiculopathy  PRECAUTIONS: None  WEIGHT BEARING RESTRICTIONS: No  FALLS:  Has patient fallen in last 6 months? No  LIVING ENVIRONMENT: Lives with: lives with their spouse Lives in: House/apartment Stairs: Yes: External: 1 steps; on left going up Has following equipment at home: Single point cane and Grab bars  OCCUPATION: Retired  PLOF: Independent and Leisure: read, Clinical cytogeneticist, walking around the house or in the backyard or short distances in the neighborhood  PATIENT GOALS: "To relieve the pain."   OBJECTIVE: (objective measures completed at initial evaluation unless otherwise dated)  DIAGNOSTIC FINDINGS:  10/10/19 - Lumbar MRI IMPRESSION: 1. Facet osteoarthritis at L3-4 and below with L4-5 and L5-S1 anterolisthesis. 2. Variable disc degeneration at L2-3 and below, advanced at L5-S1. 3. Spinal stenosis that is high-grade at L4-5 and moderate at L5-S1. 4. Moderate foraminal narrowing on the left at L4-5 and bilaterally at L5-S1.  PATIENT SURVEYS:  Modified Oswestry 23 / 50 = 46.0 %  LEFS 26 / 80 = 32.5 % FOTO : lumbar spine = 44, predicted = 55  SCREENING FOR RED FLAGS: Bowel or bladder incontinence: No Spinal tumors: No Cauda equina syndrome: No Compression fracture: No Abdominal aneurysm: No  COGNITION:  Overall cognitive status: Within functional limits for tasks  assessed    SENSATION: WFL Occasional numbness and tingling down L LE  MUSCLE LENGTH:  Hamstrings: mild tightness L>R ITB: mod tight R>L Piriformis: mod tight R>L Hip flexors: mild tight B Quads: mild tight B Heelcord: NT  POSTURE:  rounded shoulders, forward head, increased lumbar lordosis, and anterior pelvic tilt  PALPATION: Increased muscle tension and TTP in L>R lumbar paraspinals, glutes, and piriformis  LUMBAR ROM:   Active  Eval *  Flexion Hands to mid shins  Extension 50% limited  Right lateral flexion Hand to lower thigh  Left lateral flexion Hand to lateral knee  Right rotation WFL  Left rotation WFL  (Blank rows = not tested, * = increased pain with all motions on eval except R lateral flexion)  LOWER EXTREMITY ROM:    Mildly limited hip ROM due to body habitus, but otherwise WFL  LOWER EXTREMITY MMT:    MMT Right eval Left eval  Hip flexion 4+ 4  Hip extension 3+ 2+  Hip abduction 4 4-  Hip adduction 4- 3-  Hip internal rotation 5 4  Hip external rotation 4- 3+  Knee flexion 5 5  Knee extension 5 5  Ankle dorsiflexion 4 4  Ankle plantarflexion    Ankle inversion    Ankle eversion     (Blank rows = not tested)  LUMBAR SPECIAL TESTS:  Straight leg raise test: Positive on left   TODAY'S TREATMENT:   01/30/23 THERAPEUTIC EXERCISE: to improve flexibility, strength and mobility.  Demonstration, verbal and tactile cues throughout for technique. Nustep L4 x 6 min POE 2 x 2 min Prone press-ups 10 x 5" Supine R/L SKTC with pillow case & opp LE straight 3 x 30" Hooklying KTOS piriformis stretch 3 x 30" bil  MANUAL THERAPY: To promote normalized muscle tension, improved flexibility, improved joint mobility, and reduced pain. Grade II-III lumbar CPAs & bilateral UPAs Foam roller to lumbar paraspinals and L glutes/piriformis Skilled palpation and monitoring of soft tissue during DN Trigger Point Dry-Needling  Treatment instructions: Expect mild to  moderate muscle soreness. S/S of pneumothorax if dry needled over a lung field, and to seek immediate medical attention should they occur. Patient verbalized understanding of these instructions and education. Patient Consent Given: Yes Education handout provided: Previously provided Muscles treated: B lumbar multifidi, L erector spinae, glutes and piriformis Electrical stimulation performed: No Parameters: N/A Treatment response/outcome: Twitch Response Elicited and Palpable Increase in Muscle Length STM/DTM, manual TPR and pin & stretch to muscles addressed with DN   01/28/23 THERAPEUTIC EXERCISE: to improve flexibility, strength and mobility.  Demonstration, verbal and tactile cues throughout for technique. Nustep L4 x 6 min Standing TrA + GTB rows x 20 Standing TrA + GTB scap retraction/shoulder extension x 20 Standing R/L GTB pallof press x 20 each Supine bridge GTB with TrA 2x10 S/L clamshells bil GTB 10x3" S/L hip abduction x 10 R; 2x5 L side Supine LTR 5x5" bil Supine figure 4 stretch 2x30 sec  Supine SKTC L with towel 2x30 sec    01/22/23 THERAPEUTIC EXERCISE: to improve flexibility, strength and mobility.  Demonstration, verbal and tactile cues throughout for technique. Nustep L5 x 6 min Standing TrA + GTB rows 2 x 10 Standing TrA + GTB scap retraction/shoulder extension 2 x 10 Standing R/L GTB pallof press x 10 each Standing alt hip extension, leaning slightly forward on counter x 10 Seated TrA isometric + B UE press into blue/green striped ball in lap 10 x 5" Seated oblique isometric + alt contralateral UELE press into blue/green striped ball in lap 10 x 5" S/L RTB clam 2  x 10 bil Hooklying TrA/PPT + hip ADD ball squeeze isometric 10 x 5" Bridge + hip ADD isometric 10 x 3" Hooklying TrA/PPT + GTB alt hip ABD/ER bent-knee fallout 10 x 5" Bridge + GTB hip ABD isometric 10 x 3"   PATIENT EDUCATION:  Education details: role of DN and DN rational, procedure, outcomes,  potential side effects, and recommended post-treatment exercises/activity  Person educated: Patient Education method: Explanation and Handouts Education comprehension: verbalized understanding  HOME EXERCISE PROGRAM: Access Code: [email or web address] URL: [email or web address] Date: 01/22/2023 Prepared by: Glenetta Hew  Exercises - Hooklying Single Knee to Chest Stretch with Towel  - 2 x daily - 7 x weekly - 3 reps - 30 sec hold - Supine Piriformis Stretch with Foot on Ground  - 2 x daily - 7 x weekly - 3 reps - 30 sec hold - Supine Figure 4 Piriformis Stretch  - 2 x daily - 7 x weekly - 3 reps - 30 sec hold - Seated Hamstring Stretch  - 2 x daily - 7 x weekly - 3 reps - 30 sec hold - Prone Hip Extension - Two Pillows  - 1 x daily - 7 x weekly - 2 sets - 10 reps - 3 sec hold - Supine Hip Adduction Isometric with Ball  - 1 x daily - 7 x weekly - 2 sets - 10 reps - 5 sec hold - Clam with Resistance  - 1 x daily - 3 x weekly - 2 sets - 10 reps - 3-5 sec hold - Standing Bilateral Low Shoulder Row with Anchored Resistance  - 1 x daily - 3 x weekly - 2 sets - 10 reps - 5 sec hold - Shoulder extension with resistance - Neutral  - 1 x daily - 3 x weekly - 2 sets - 10 reps - 5 sec hold - Standing Anti-Rotation Press with Anchored Resistance  - 1 x daily - 3 x weekly - 2 sets - 10 reps - 3 sec hold  Patient Education - Trigger Point Dry Needling   ASSESSMENT:  CLINICAL IMPRESSION: Ariyanah reports 60% improvement with PT thus far, reporting centralization of pain localized to back most of the time, although she is having some L thigh pain today. She expressed interest in trying the DN today. After explanation of DN rational, procedures, outcomes and potential side effects, patient verbalized consent to DN treatment in conjunction with manual STM/DTM and TPR to reduce ttp/muscle tension. Muscles treated as indicated above. DN produced normal response with good twitches elicited resulting in  palpable reduction in pain/ttp and muscle tension, with pt reporting resolution of R thigh pain by end of session. Pt educated to expect mild to moderate muscle soreness for up to 24-48 hrs and instructed to continue prescribed HEP and current activity level with pt verbalizing understanding of these instructions. Pt will be traveling on vacation and will assess progress upon return to determine if she will be ready to transition to her HEP at the end of her POC or will require recert.  OBJECTIVE IMPAIRMENTS: decreased activity tolerance, decreased endurance, decreased knowledge of condition, decreased mobility, difficulty walking, decreased ROM, decreased strength, hypomobility, increased fascial restrictions, impaired perceived functional ability, increased muscle spasms, impaired flexibility, impaired sensation, improper body mechanics, postural dysfunction, and pain.   ACTIVITY LIMITATIONS: carrying, lifting, bending, sitting, standing, squatting, sleeping, stairs, transfers, bed mobility, bathing, dressing, locomotion level, and caring for others  PARTICIPATION LIMITATIONS: meal prep, cleaning, laundry, driving, shopping, community activity, and yard work  PERSONAL FACTORS: Age, Fitness, Past/current experiences, Time since onset of injury/illness/exacerbation, and 3+ comorbidities: Prediabetes/DM-II, thyroid disease, hx of endometrial CA treated with chemo, osteopenia, OA, HTN, HLD, GERD, BPPV, asthma, anxiety, depression, h/o flank pain and LBP with R LE radiculopathy  are also affecting patient's functional outcome.   REHAB POTENTIAL: Good  CLINICAL DECISION MAKING: Stable/uncomplicated  EVALUATION COMPLEXITY: Low   GOALS: Goals reviewed with patient? Yes  SHORT TERM GOALS: Target date: 01/23/2023  Patient will be independent with initial HEP to improve outcomes and carryover.  Baseline: Initial HEP provided on eval Goal status: MET  01/18/23  2.  Patient will report centralization of  radicular symptoms.  Baseline: Intermittent L LE pain, numbness and tingling down to foot Goal status: MET  01/22/23 - radicular pain now only intermittently into L thigh  LONG TERM GOALS: Target date: 02/13/2023  Patient will be independent with ongoing/advanced HEP for self-management at home.  Baseline:  Goal status: IN PROGRESS  2.  Patient will report 50-75% improvement in low back pain and L LE radiculopathy to improve QOL.  Baseline: 3-4/10 Goal status: IN PROGRESS  01/30/23 - Pt reports 60% improvement  3.  Patient to demonstrate ability to achieve and maintain good spinal alignment/posturing and body mechanics needed for daily activities. Baseline:  Goal status: IN PROGRESS  4.  Patient will demonstrate functional pain free lumbar ROM to perform ADLs.   Baseline: Limited lumbar AROM in all planes except rotation, with pain for all motions except right lateral flexion - refer to above lumbar ROM table Goal status: IN PROGRESS  5.  Patient will demonstrate improved B LE strength to >/= 4+/5 for improved stability and ease of mobility . Baseline: Refer to above LE MMT table Goal status: IN PROGRESS  6.  Patient will report >/= 55 on lumbar FOTO to demonstrate improved functional ability.  Baseline: 44 Goal status: IN PROGRESS   7. Patient will report </= 34% on Modified Oswestry to demonstrate improved functional ability with decreased pain interference. Baseline: 23 / 50 = 46.0 % Goal status: IN PROGRESS  8.  Patient will report >/= 35/80 on LEFS to demonstrate improved functional ability with decreased pain interference. Baseline: 26 / 80 = 32.5 % Goal status: IN PROGRESS  9.  Patient will tolerate 30+ min of standing or walking w/o increased pain to allow for improved mobility and activity tolerance. Baseline: Standing tolerance limited to <30 minutes and only able to walk short distances in home or yard without limitation due to pain Goal status: IN  PROGRESS   PLAN:  PT FREQUENCY: 2x/week  PT DURATION: 6 weeks  PLANNED INTERVENTIONS: Therapeutic exercises, Therapeutic activity, Neuromuscular re-education, Balance training, Gait training, Patient/Family education, Self Care, Joint mobilization, Stair training, DME instructions, Aquatic Therapy, Dry Needling, Electrical stimulation, Spinal manipulation, Spinal mobilization, Cryotherapy, Moist heat, Taping, Traction, Ultrasound, Ionotophoresis 4mg /ml Dexamethasone, Manual therapy, and Re-evaluation  PLAN FOR NEXT SESSION: assess response to DN; progress lumbar flexibility and strengthening - update HEP accordingly; MT +/- DN to address abnormal muscle tension lumbar paraspinals, glutes and piriformis   Marry Guan, PT  01/30/2023, 9:16 AM

## 2023-02-05 ENCOUNTER — Encounter: Payer: Medicare Other | Admitting: Physical Therapy

## 2023-02-06 ENCOUNTER — Encounter: Payer: Medicare Other | Admitting: Physical Therapy

## 2023-02-08 ENCOUNTER — Encounter: Payer: Medicare Other | Admitting: Physical Therapy

## 2023-02-11 ENCOUNTER — Encounter: Payer: Self-pay | Admitting: Physical Therapy

## 2023-02-11 ENCOUNTER — Ambulatory Visit: Payer: Medicare Other | Admitting: Physical Therapy

## 2023-02-11 DIAGNOSIS — M5459 Other low back pain: Secondary | ICD-10-CM | POA: Diagnosis not present

## 2023-02-11 DIAGNOSIS — M5416 Radiculopathy, lumbar region: Secondary | ICD-10-CM | POA: Diagnosis not present

## 2023-02-11 DIAGNOSIS — M6281 Muscle weakness (generalized): Secondary | ICD-10-CM | POA: Diagnosis not present

## 2023-02-11 DIAGNOSIS — R262 Difficulty in walking, not elsewhere classified: Secondary | ICD-10-CM

## 2023-02-11 NOTE — Therapy (Signed)
OUTPATIENT PHYSICAL THERAPY TREATMENT   Patient Name: KEYLY BALDONADO MRN: [id number] DOB:Mar 22, 1951, 72 y.o., female Today's Date: 02/11/2023  END OF SESSION:  PT End of Session - 02/11/23 0802     Visit Number 8    Date for PT Re-Evaluation 02/13/23    Authorization Type [employer] & [employer]    Progress Note Due on Visit 10    PT Start Time 0802    PT Stop Time 0847    PT Time Calculation (min) 45 min    Activity Tolerance Patient tolerated treatment well    Behavior During Therapy [facility] for tasks assessed/performed                 Past Medical History:  Diagnosis Date   Allergy    SEASONAL   Anxiety    Arthritis    back   Asthma    Depression    Endometrial cancer (HCC) 2015   Family history of breast cancer    Family history of colon cancer    Family history of ovarian cancer    GERD (gastroesophageal reflux disease)    History of colonic diverticulitis    Hx of colonic polyps    Hyperlipidemia    Hypertension    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   Osteopenia 09/2017   T score -1.2 FRAX 3.2% / 0.3%   Personal history of chemotherapy 2015   Radiation 12/17/14-01/03/15   vaginal brachytherapy   Thyroid disease 06/01/2013   Past Surgical History:  Procedure Laterality Date   ABDOMINAL HYSTERECTOMY  06/23/2014   [facility], TRH/BSO   CHOLECYSTECTOMY  1991   COLONOSCOPY     DILATION AND CURETTAGE OF UTERUS     IR REMOVAL TUN ACCESS W/ PORT W/O FL MOD SED  04/30/2017   POLYPECTOMY     TUBAL LIGATION     Patient Active Problem List   Diagnosis Date Noted   Acute cough 12/19/2022   Chronic rhinitis 08/21/2021   Mass of left elbow 09/14/2019   Flank pain 09/14/2019   Subacromial impingement of left shoulder 12/30/2018   Moderate persistent asthma without complication 08/07/2018   Current use of beta blocker 06/30/2018   Perennial allergic rhinitis 06/30/2018   Acute bronchitis due to other specified organisms 06/30/2018   History of endometrial  cancer 02/03/2018   Screening Mammogram 11/05/2017   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   Type 2 diabetes mellitus with diabetic neuropathy, without long-term current use of insulin (HCC) 10/25/2016   Microcytosis 09/28/2016   Mild intermittent asthma without complication 01/26/2016   Port catheter in place 11/15/2015   Family history of breast cancer    Family history of ovarian cancer    Family history of prostate cancer    Family history of colon cancer    Morbid obesity (HCC) 11/09/2014   H/O total hysterectomy with bilateral salpingo-oophorectomy (BSO) 06/24/2014   Endometrial cancer (HCC) 05/20/2014   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   Hypothyroidism 06/01/2013   Low back pain radiating to right leg 08/18/2012   Anemia 11/07/2011   Hyperlipidemia 06/15/2011   Diabetes mellitus type 2 in obese 03/12/2011   Acute pain of right knee 01/27/2011   Obstructive sleep apnea 11/15/2010   Osteoarthritis 11/15/2010   Gastroesophageal reflux disease without esophagitis 09/14/2010   DIVERTICULITIS, HX OF 09/14/2010   POSTMENOPAUSAL STATUS 10/01/2008   COLONIC POLYPS, HX OF 03/10/2008   Essential hypertension 08/13/2006    PCP: Sharlene Dory, DO   REFERRING PROVIDER: Arva Chafe  Renae Fickle, DO  REFERRING DIAG:  M79.18 (ICD-10-CM) - Gluteal pain M54.50 (ICD-10-CM) - Left-sided low back pain without sciatica, unspecified chronicity  THERAPY DIAG:  Other low back pain  Radiculopathy, lumbar region  Muscle weakness (generalized)  Difficulty in walking, not elsewhere classified  RATIONALE FOR EVALUATION AND TREATMENT: Rehabilitation  ONSET DATE: February 2024  NEXT MD VISIT: 04/10/23   SUBJECTIVE:                                                                                                                                                                                                         SUBJECTIVE STATEMENT: Pt reports her leg pain  is gone but still has some pain in her back. She notes a burning sensation when she walks a lot.Marland Kitchen  PAIN: Are you having pain? Yes: NPRS scale:  3/10 Pain location: low back  Pain description: dull  Aggravating factors: prolonged walking Relieving factors: exercises, Tylenol, Voltaren  PERTINENT HISTORY:  Prediabetes/DM-II, thyroid disease, hx of endometrial CA treated with chemo, osteopenia, OA, HTN, HLD, GERD, BPPV, asthma, anxiety, depression, h/o flank pain and LBP with R LE radiculopathy  PRECAUTIONS: None  WEIGHT BEARING RESTRICTIONS: No  FALLS:  Has patient fallen in last 6 months? No  LIVING ENVIRONMENT: Lives with: lives with their spouse Lives in: House/apartment Stairs: Yes: External: 1 steps; on left going up Has following equipment at home: Single point cane and Grab bars  OCCUPATION: Retired  PLOF: Independent and Leisure: read, Clinical cytogeneticist, walking around the house or in the backyard or short distances in the neighborhood  PATIENT GOALS: "To relieve the pain."   OBJECTIVE: (objective measures completed at initial evaluation unless otherwise dated)  DIAGNOSTIC FINDINGS:  10/10/19 - Lumbar MRI IMPRESSION: 1. Facet osteoarthritis at L3-4 and below with L4-5 and L5-S1 anterolisthesis. 2. Variable disc degeneration at L2-3 and below, advanced at L5-S1. 3. Spinal stenosis that is high-grade at L4-5 and moderate at L5-S1. 4. Moderate foraminal narrowing on the left at L4-5 and bilaterally at L5-S1.  PATIENT SURVEYS:  Modified Oswestry 23 / 50 = 46.0 %  LEFS 26 / 80 = 32.5 % FOTO : lumbar spine = 44, predicted = 55  SCREENING FOR RED FLAGS: Bowel or bladder incontinence: No Spinal tumors: No Cauda equina syndrome: No Compression fracture: No Abdominal aneurysm: No  COGNITION:  Overall cognitive status: Within functional limits for tasks assessed    SENSATION: WFL Occasional numbness and tingling down L LE  MUSCLE LENGTH: Hamstrings: mild tightness  L>R ITB: mod tight R>L Piriformis: mod tight R>L Hip flexors: mild  tight B Quads: mild tight B Heelcord: NT  POSTURE:  rounded shoulders, forward head, increased lumbar lordosis, and anterior pelvic tilt  PALPATION: Increased muscle tension and TTP in L>R lumbar paraspinals, glutes, and piriformis  LUMBAR ROM:   Active  Eval * 02/11/23  Flexion Hands to mid shins WFL - Hands to ankles  Extension 50% limited WFL  Right lateral flexion Hand to lower thigh WFL - Hand to mid calf  Left lateral flexion Hand to lateral knee WFL - Hand to mid calf  Right rotation [facility] WFL  Left rotation [facility] WFL  (Blank rows = not tested, * = increased pain with all motions on eval except R lateral flexion)  LOWER EXTREMITY ROM:    Mildly limited hip ROM due to body habitus, but otherwise [facility]  LOWER EXTREMITY MMT:    MMT Right eval Left eval Right 02/11/23 Left 02/11/23  Hip flexion 4+ 4 5 5   Hip extension 3+ 2+ 4+ 4+  Hip abduction 4 4- 5 5  Hip adduction 4- 3- 4+ 4+  Hip internal rotation 5 4 5 5   Hip external rotation 4- 3+ 4+ 4+  Knee flexion 5 5 5 5   Knee extension 5 5 5 5   Ankle dorsiflexion 4 4 5 5   Ankle plantarflexion      Ankle inversion      Ankle eversion       (Blank rows = not tested)  LUMBAR SPECIAL TESTS:  Straight leg raise test: Positive on left   TODAY'S TREATMENT:   02/11/23 THERAPEUTIC EXERCISE: to improve flexibility, strength and mobility.  Demonstration, verbal and tactile cues throughout for technique. Rec Bike L2 x 6 min HEP review S/L GTB clam 2 x 10 bil  THERAPEUTIC ACTIVITIES: Provided education in proper posture and body mechanics for typical daily positioning and household chores to minimize strain on low back and neck. Lumbar ROM LE MMT Initiation of goal assessment   01/30/23 THERAPEUTIC EXERCISE: to improve flexibility, strength and mobility.  Demonstration, verbal and tactile cues throughout for technique. Nustep L4 x 6 min POE 2 x 2 min Prone  press-ups 10 x 5" Supine R/L SKTC with pillow case & opp LE straight 3 x 30" Hooklying KTOS piriformis stretch 3 x 30" bil  MANUAL THERAPY: To promote normalized muscle tension, improved flexibility, improved joint mobility, and reduced pain. Grade II-III lumbar CPAs & bilateral UPAs Foam roller to lumbar paraspinals and L glutes/piriformis Skilled palpation and monitoring of soft tissue during DN Trigger Point Dry-Needling  Treatment instructions: Expect mild to moderate muscle soreness. S/S of pneumothorax if dry needled over a lung field, and to seek immediate medical attention should they occur. Patient verbalized understanding of these instructions and education. Patient Consent Given: Yes Education handout provided: Previously provided Muscles treated: B lumbar multifidi, L erector spinae, glutes and piriformis Electrical stimulation performed: No Parameters: N/A Treatment response/outcome: Twitch Response Elicited and Palpable Increase in Muscle Length STM/DTM, manual TPR and pin & stretch to muscles addressed with DN   01/28/23 THERAPEUTIC EXERCISE: to improve flexibility, strength and mobility.  Demonstration, verbal and tactile cues throughout for technique. Nustep L4 x 6 min Standing TrA + GTB rows x 20 Standing TrA + GTB scap retraction/shoulder extension x 20 Standing R/L GTB pallof press x 20 each Supine bridge GTB with TrA 2x10 S/L clamshells bil GTB 10x3" S/L hip abduction x 10 R; 2x5 L side Supine LTR 5x5" bil Supine figure 4 stretch 2x30 sec  Supine SKTC L with  towel 2x30 sec    PATIENT EDUCATION:  Education details: HEP review, HEP consolidation, and recommended frequency for ongoing HEP at discharge to prevent loss of gains achieved with PT  Person educated: Patient Education method: Medical illustrator Education comprehension: verbalized understanding and returned demonstration  HOME EXERCISE PROGRAM: Access Code: [email or web address] URL:  [email or web address] Date: 02/11/2023 Prepared by: Glenetta Hew  Exercises - Hooklying Single Knee to Chest Stretch with Towel  - 2 x daily - 7 x weekly - 3 reps - 30 sec hold - Supine Piriformis Stretch with Foot on Ground  - 2 x daily - 7 x weekly - 3 reps - 30 sec hold - Supine Figure 4 Piriformis Stretch  - 2 x daily - 7 x weekly - 3 reps - 30 sec hold - Seated Hamstring Stretch  - 2 x daily - 7 x weekly - 3 reps - 30 sec hold - Prone Hip Extension - Two Pillows  - 1 x daily - 3 x weekly - 2 sets - 10 reps - 3 sec hold - Supine Hip Adduction Isometric with Ball  - 1 x daily - 3 x weekly - 2 sets - 10 reps - 5 sec hold - Clam with Resistance  - 1 x daily - 3 x weekly - 2 sets - 10 reps - 3-5 sec hold - Supine March with Resistance Band  - 1 x daily - 3 x weekly - 2 sets - 10 reps - 2-3 sec hold hold - Standing Bilateral Low Shoulder Row with Anchored Resistance  - 1 x daily - 3 x weekly - 2 sets - 10 reps - 5 sec hold - Shoulder extension with resistance - Neutral  - 1 x daily - 3 x weekly - 2 sets - 10 reps - 5 sec hold - Standing Anti-Rotation Press with Anchored Resistance  - 1 x daily - 3 x weekly - 2 sets - 10 reps - 3 sec hold  Patient Education - Trigger Point Dry Needling - Posture and Body Mechanics   ASSESSMENT:  CLINICAL IMPRESSION: Christianne is pleased with her progress with PT and feels that she will be ready to transition to her HEP as of her final visit later this week. She reports full centralization of her radicular pain and states her back pain is well managed with her exercises and OTC meds. Education provided for proper posture and body mechanics for typical daily positioning and household chores to minimize strain on low back with pt noting some potential areas for improvement. Lumbar ROM now [employer] with only slight pulling noted with some motions. LE strength significantly improved per MMT. HEP reviewed with progression of resistance and guidance provide on  recommended frequency for ongoing performance.  Will plan for final review of HEP and instruction in use of appropriate gym equipment, as well as remaining goal assessment next visit with anticipated transition to HEP +/- 30-day hold.  OBJECTIVE IMPAIRMENTS: decreased activity tolerance, decreased endurance, decreased knowledge of condition, decreased mobility, difficulty walking, decreased ROM, decreased strength, hypomobility, increased fascial restrictions, impaired perceived functional ability, increased muscle spasms, impaired flexibility, impaired sensation, improper body mechanics, postural dysfunction, and pain.   ACTIVITY LIMITATIONS: carrying, lifting, bending, sitting, standing, squatting, sleeping, stairs, transfers, bed mobility, bathing, dressing, locomotion level, and caring for others  PARTICIPATION LIMITATIONS: meal prep, cleaning, laundry, driving, shopping, community activity, and yard work  PERSONAL FACTORS: Age, Fitness, Past/current experiences, Time since onset of injury/illness/exacerbation, and 3+ comorbidities: Prediabetes/DM-II, thyroid disease, hx of  endometrial CA treated with chemo, osteopenia, OA, HTN, HLD, GERD, BPPV, asthma, anxiety, depression, h/o flank pain and LBP with R LE radiculopathy  are also affecting patient's functional outcome.   REHAB POTENTIAL: Good  CLINICAL DECISION MAKING: Stable/uncomplicated  EVALUATION COMPLEXITY: Low   GOALS: Goals reviewed with patient? Yes  SHORT TERM GOALS: Target date: 01/23/2023  Patient will be independent with initial HEP to improve outcomes and carryover.  Baseline: Initial HEP provided on eval Goal status: MET  01/18/23  2.  Patient will report centralization of radicular symptoms.  Baseline: Intermittent L LE pain, numbness and tingling down to foot Goal status: MET  01/22/23 - radicular pain now only intermittently into L thigh  LONG TERM GOALS: Target date: 02/13/2023  Patient will be independent with  ongoing/advanced HEP for self-management at home.  Baseline:  Goal status: IN PROGRESS  02/11/23 - HEP reviewed and updated  2.  Patient will report 50-75% improvement in low back pain and L LE radiculopathy to improve QOL.  Baseline: 3-4/10 Goal status: IN PROGRESS  01/30/23 - Pt reports 60% improvement  3.  Patient to demonstrate ability to achieve and maintain good spinal alignment/posturing and body mechanics needed for daily activities. Baseline:  Goal status: MET  02/11/23  4.  Patient will demonstrate functional pain free lumbar ROM to perform ADLs.   Baseline: Limited lumbar AROM in all planes except rotation, with pain for all motions except right lateral flexion - refer to above lumbar ROM table Goal status: MET  02/11/23  5.  Patient will demonstrate improved B LE strength to >/= 4+/5 for improved stability and ease of mobility . Baseline: Refer to above LE MMT table Goal status: MET  02/11/23  6.  Patient will report >/= 55 on lumbar FOTO to demonstrate improved functional ability.  Baseline: 44 Goal status: IN PROGRESS   7. Patient will report </= 34% on Modified Oswestry to demonstrate improved functional ability with decreased pain interference. Baseline: 23 / 50 = 46.0 % Goal status: IN PROGRESS  8.  Patient will report >/= 35/80 on LEFS to demonstrate improved functional ability with decreased pain interference. Baseline: 26 / 80 = 32.5 % Goal status: IN PROGRESS  9.  Patient will tolerate 30+ min of standing or walking w/o increased pain to allow for improved mobility and activity tolerance. Baseline: Standing tolerance limited to <30 minutes and only able to walk short distances in home or yard without limitation due to pain Goal status: MET  02/11/23 - Able to walk 1 mile   PLAN:  PT FREQUENCY: 2x/week  PT DURATION: 6 weeks  PLANNED INTERVENTIONS: Therapeutic exercises, Therapeutic activity, Neuromuscular re-education, Balance training, Gait training,  Patient/Family education, Self Care, Joint mobilization, Stair training, DME instructions, Aquatic Therapy, Dry Needling, Electrical stimulation, Spinal manipulation, Spinal mobilization, Cryotherapy, Moist heat, Taping, Traction, Ultrasound, Ionotophoresis 4mg /ml Dexamethasone, Manual therapy, and Re-evaluation  PLAN FOR NEXT SESSION: final HEP review + instruction in gym exercises/machines; complete goal assessment - LEFS, modified Oswestry, lumbar FOTO; anticipate transition to HEP +/- 30-day hold  Marry Guan, PT  02/11/2023, 12:22 PM

## 2023-02-13 ENCOUNTER — Encounter: Payer: Self-pay | Admitting: Physical Therapy

## 2023-02-13 ENCOUNTER — Ambulatory Visit: Payer: Medicare Other | Admitting: Physical Therapy

## 2023-02-13 DIAGNOSIS — R262 Difficulty in walking, not elsewhere classified: Secondary | ICD-10-CM | POA: Diagnosis not present

## 2023-02-13 DIAGNOSIS — M6281 Muscle weakness (generalized): Secondary | ICD-10-CM | POA: Diagnosis not present

## 2023-02-13 DIAGNOSIS — M5416 Radiculopathy, lumbar region: Secondary | ICD-10-CM | POA: Diagnosis not present

## 2023-02-13 DIAGNOSIS — M5459 Other low back pain: Secondary | ICD-10-CM

## 2023-02-13 NOTE — Therapy (Addendum)
OUTPATIENT PHYSICAL THERAPY TREATMENT / DISCHARGE SUMMARY  Progress Note  Reporting Period 01/02/2023 to 02/13/2023   See note below for Objective Data and Assessment of Progress/Goals.     Patient Name: Katelyn Fisher MRN: [id number] DOB:1951/07/07, 72 y.o., female Today's Date: 02/13/2023  END OF SESSION:  PT End of Session - 02/13/23 0801     Visit Number 9    Date for PT Re-Evaluation 02/13/23    Authorization Type [employer] & [employer]    Progress Note Due on Visit 10    PT Start Time 0801    PT Stop Time 0845    PT Time Calculation (min) 44 min    Activity Tolerance Patient tolerated treatment well    Behavior During Therapy WFL for tasks assessed/performed                 Past Medical History:  Diagnosis Date   Allergy    SEASONAL   Anxiety    Arthritis    back   Asthma    Depression    Endometrial cancer (HCC) 2015   Family history of breast cancer    Family history of colon cancer    Family history of ovarian cancer    GERD (gastroesophageal reflux disease)    History of colonic diverticulitis    Hx of colonic polyps    Hyperlipidemia    Hypertension    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   Osteopenia 09/2017   T score -1.2 FRAX 3.2% / 0.3%   Personal history of chemotherapy 2015   Radiation 12/17/14-01/03/15   vaginal brachytherapy   Thyroid disease 06/01/2013   Past Surgical History:  Procedure Laterality Date   ABDOMINAL HYSTERECTOMY  06/23/2014   [facility], TRH/BSO   CHOLECYSTECTOMY  1991   COLONOSCOPY     DILATION AND CURETTAGE OF UTERUS     IR REMOVAL TUN ACCESS W/ PORT W/O FL MOD SED  04/30/2017   POLYPECTOMY     TUBAL LIGATION     Patient Active Problem List   Diagnosis Date Noted   Acute cough 12/19/2022   Chronic rhinitis 08/21/2021   Mass of left elbow 09/14/2019   Flank pain 09/14/2019   Subacromial impingement of left shoulder 12/30/2018   Moderate persistent asthma without complication 08/07/2018   Current use of beta  blocker 06/30/2018   Perennial allergic rhinitis 06/30/2018   Acute bronchitis due to other specified organisms 06/30/2018   History of endometrial cancer 02/03/2018   Screening Mammogram 11/05/2017   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   Type 2 diabetes mellitus with diabetic neuropathy, without long-term current use of insulin (HCC) 10/25/2016   Microcytosis 09/28/2016   Mild intermittent asthma without complication 01/26/2016   Port catheter in place 11/15/2015   Family history of breast cancer    Family history of ovarian cancer    Family history of prostate cancer    Family history of colon cancer    Morbid obesity (HCC) 11/09/2014   H/O total hysterectomy with bilateral salpingo-oophorectomy (BSO) 06/24/2014   Endometrial cancer (HCC) 05/20/2014   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   Hypothyroidism 06/01/2013   Low back pain radiating to right leg 08/18/2012   Anemia 11/07/2011   Hyperlipidemia 06/15/2011   Diabetes mellitus type 2 in obese 03/12/2011   Acute pain of right knee 01/27/2011   Obstructive sleep apnea 11/15/2010   Osteoarthritis 11/15/2010   Gastroesophageal reflux disease without esophagitis 09/14/2010   DIVERTICULITIS, HX OF 09/14/2010   POSTMENOPAUSAL STATUS 10/01/2008  COLONIC POLYPS, HX OF 03/10/2008   Essential hypertension 08/13/2006    PCP: Sharlene Dory, DO   REFERRING PROVIDER: Sharlene Dory, DO  REFERRING DIAG:  [phone] (ICD-10-CM) - Gluteal pain M54.50 (ICD-10-CM) - Left-sided low back pain without sciatica, unspecified chronicity  THERAPY DIAG:  Other low back pain  Radiculopathy, lumbar region  Muscle weakness (generalized)  Difficulty in walking, not elsewhere classified  RATIONALE FOR EVALUATION AND TREATMENT: Rehabilitation  ONSET DATE: February 2024  NEXT MD VISIT: 04/10/23   SUBJECTIVE:                                                                                                                                                                                                          SUBJECTIVE STATEMENT: Pt reports she feels ready to transition to her HEP as planned today.  PAIN: Are you having pain? No  PERTINENT HISTORY:  Prediabetes/DM-II, thyroid disease, hx of endometrial CA treated with chemo, osteopenia, OA, HTN, HLD, GERD, BPPV, asthma, anxiety, depression, h/o flank pain and LBP with R LE radiculopathy  PRECAUTIONS: None  WEIGHT BEARING RESTRICTIONS: No  FALLS:  Has patient fallen in last 6 months? No  LIVING ENVIRONMENT: Lives with: lives with their spouse Lives in: House/apartment Stairs: Yes: External: 1 steps; on left going up Has following equipment at home: Single point cane and Grab bars  OCCUPATION: Retired  PLOF: Independent and Leisure: read, Clinical cytogeneticist, walking around the house or in the backyard or short distances in the neighborhood  PATIENT GOALS: "To relieve the pain."   OBJECTIVE: (objective measures completed at initial evaluation unless otherwise dated)  DIAGNOSTIC FINDINGS:  10/10/19 - Lumbar MRI IMPRESSION: 1. Facet osteoarthritis at L3-4 and below with L4-5 and L5-S1 anterolisthesis. 2. Variable disc degeneration at L2-3 and below, advanced at L5-S1. 3. Spinal stenosis that is high-grade at L4-5 and moderate at L5-S1. 4. Moderate foraminal narrowing on the left at L4-5 and bilaterally at L5-S1.  PATIENT SURVEYS:  Modified Oswestry 23 / 50 = 46.0 %  LEFS 26 / 80 = 32.5 % FOTO : lumbar spine = 44, predicted = 55  02/13/23: Modified Oswestry: 22 / 50 = 44.0 % LEFS: 51 / 80 = 63.7 % Lumbar FOTO: 52  SCREENING FOR RED FLAGS: Bowel or bladder incontinence: No Spinal tumors: No Cauda equina syndrome: No Compression fracture: No Abdominal aneurysm: No  COGNITION:  Overall cognitive status: Within functional limits for tasks assessed    SENSATION: WFL Occasional numbness and tingling down L LE  MUSCLE  LENGTH: Hamstrings: mild tightness L>R ITB:  mod tight R>L Piriformis: mod tight R>L Hip flexors: mild tight B Quads: mild tight B Heelcord: NT  POSTURE:  rounded shoulders, forward head, increased lumbar lordosis, and anterior pelvic tilt  PALPATION: Increased muscle tension and TTP in L>R lumbar paraspinals, glutes, and piriformis  LUMBAR ROM:   Active  Eval * 02/11/23  Flexion Hands to mid shins WFL - Hands to ankles  Extension 50% limited WFL  Right lateral flexion Hand to lower thigh WFL - Hand to mid calf  Left lateral flexion Hand to lateral knee WFL - Hand to mid calf  Right rotation [facility] WFL  Left rotation [facility] WFL  (Blank rows = not tested, * = increased pain with all motions on eval except R lateral flexion)  LOWER EXTREMITY ROM:    Mildly limited hip ROM due to body habitus, but otherwise [facility]  LOWER EXTREMITY MMT:    MMT Right eval Left eval Right 02/11/23 Left 02/11/23  Hip flexion 4+ 4 5 5   Hip extension 3+ 2+ 4+ 4+  Hip abduction 4 4- 5 5  Hip adduction 4- 3- 4+ 4+  Hip internal rotation 5 4 5 5   Hip external rotation 4- 3+ 4+ 4+  Knee flexion 5 5 5 5   Knee extension 5 5 5 5   Ankle dorsiflexion 4 4 5 5   Ankle plantarflexion      Ankle inversion      Ankle eversion       (Blank rows = not tested)  LUMBAR SPECIAL TESTS:  Straight leg raise test: Positive on left   TODAY'S TREATMENT:   02/13/23 THERAPEUTIC EXERCISE: to improve flexibility, strength and mobility.  Demonstration, verbal and tactile cues throughout for technique. Rec Bike L2 x 6 min BATCA leg press demonstrated but not attempted due to low seat height BATCA seated row - low grip 20# 2 x 10 BATCA knee flexion B LE 20# 2 x 10 BATCA knee extension  THERAPEUTIC ACTIVITIES: Modified Oswestry: 22 / 50 = 44.0 % LEFS: 51 / 80 = 63.7 % Lumbar FOTO: 52 Goal assessment    02/11/23 THERAPEUTIC EXERCISE: to improve flexibility, strength and mobility.  Demonstration, verbal and tactile cues  throughout for technique. Rec Bike L2 x 6 min HEP review S/L GTB clam 2 x 10 bil  THERAPEUTIC ACTIVITIES: Provided education in proper posture and body mechanics for typical daily positioning and household chores to minimize strain on low back and neck. Lumbar ROM LE MMT Initiation of goal assessment   01/30/23 THERAPEUTIC EXERCISE: to improve flexibility, strength and mobility.  Demonstration, verbal and tactile cues throughout for technique. Nustep L4 x 6 min POE 2 x 2 min Prone press-ups 10 x 5" Supine R/L SKTC with pillow case & opp LE straight 3 x 30" Hooklying KTOS piriformis stretch 3 x 30" bil  MANUAL THERAPY: To promote normalized muscle tension, improved flexibility, improved joint mobility, and reduced pain. Grade II-III lumbar CPAs & bilateral UPAs Foam roller to lumbar paraspinals and L glutes/piriformis Skilled palpation and monitoring of soft tissue during DN Trigger Point Dry-Needling  Treatment instructions: Expect mild to moderate muscle soreness. S/S of pneumothorax if dry needled over a lung field, and to seek immediate medical attention should they occur. Patient verbalized understanding of these instructions and education. Patient Consent Given: Yes Education handout provided: Previously provided Muscles treated: B lumbar multifidi, L erector spinae, glutes and piriformis Electrical stimulation performed: No Parameters: N/A Treatment response/outcome: Twitch Response Elicited and Palpable Increase in Muscle Length STM/DTM, manual  TPR and pin & stretch to muscles addressed with DN   PATIENT EDUCATION:  Education details: recommended frequency for ongoing HEP at discharge to prevent loss of gains achieved with PT and use of gym weight machines for continued strengthening   Person educated: Patient Education method: Medical illustrator Education comprehension: verbalized understanding and returned demonstration  HOME EXERCISE PROGRAM: Access Code:  [email or web address] URL: [email or web address] Date: 02/13/2023 Prepared by: Glenetta Hew  Exercises - Hooklying Single Knee to Chest Stretch with Towel  - 2 x daily - 7 x weekly - 3 reps - 30 sec hold - Supine Piriformis Stretch with Foot on Ground  - 2 x daily - 7 x weekly - 3 reps - 30 sec hold - Supine Figure 4 Piriformis Stretch  - 2 x daily - 7 x weekly - 3 reps - 30 sec hold - Seated Hamstring Stretch  - 2 x daily - 7 x weekly - 3 reps - 30 sec hold - Prone Hip Extension - Two Pillows  - 1 x daily - 3 x weekly - 2 sets - 10 reps - 3 sec hold - Supine Hip Adduction Isometric with Ball  - 1 x daily - 3 x weekly - 2 sets - 10 reps - 5 sec hold - Clam with Resistance  - 1 x daily - 3 x weekly - 2 sets - 10 reps - 3-5 sec hold - Supine March with Resistance Band  - 1 x daily - 3 x weekly - 2 sets - 10 reps - 2-3 sec hold hold - Standing Bilateral Low Shoulder Row with Anchored Resistance  - 1 x daily - 3 x weekly - 2 sets - 10 reps - 5 sec hold - Shoulder extension with resistance - Neutral  - 1 x daily - 3 x weekly - 2 sets - 10 reps - 5 sec hold - Standing Anti-Rotation Press with Anchored Resistance  - 1 x daily - 3 x weekly - 2 sets - 10 reps - 3 sec hold - Hamstring Curl with Weight Machine  - 1 x daily - 3 x weekly - 2 sets - 10 reps - 3 sec hold - Knee Extension with Weight Machine  - 1 x daily - 3 x weekly - 2 sets - 10 reps - 3 sec hold - Full Leg Press  - 1 x daily - 3 x weekly - 2 sets - 10 reps - 3 sec hold - Seated Row Cable Machine  - 1 x daily - 3 x weekly - 2 sets - 10 reps - 3 sec hold  Patient Education - Trigger Point Dry Needling - Posture and Body Mechanics   ASSESSMENT:  CLINICAL IMPRESSION: Berthenia reports 85% improvement since start of PT with LE radicular pain resolved and low back pain less frequent and intense. Perceived improvement also noted on patient outcome measures, especially LEFS, although modified Oswestry and lumbar FOTO did not achieve goal  levels. Lumbar ROM now [facility] with only slight pulling noted with some motions. LE strength significantly improved per MMT. She feels comfortable with the HEP review and update completed last visit and denies need for further review. We went over the weight machines she could use at the gym to further help with core and UE/LE strengthening, with pt verbalizing good understanding of setup and technique.  All PT goals now met with exception of modified Oswestry and lumbar FOTO goals, and Wynnie feels ready to transition to her HEP but would like to  remain on hold for 30-days in the event that issues arise that would necessitate a return to PT.   OBJECTIVE IMPAIRMENTS: decreased activity tolerance, decreased endurance, decreased knowledge of condition, decreased mobility, difficulty walking, decreased ROM, decreased strength, hypomobility, increased fascial restrictions, impaired perceived functional ability, increased muscle spasms, impaired flexibility, impaired sensation, improper body mechanics, postural dysfunction, and pain.   ACTIVITY LIMITATIONS: carrying, lifting, bending, sitting, standing, squatting, sleeping, stairs, transfers, bed mobility, bathing, dressing, locomotion level, and caring for others  PARTICIPATION LIMITATIONS: meal prep, cleaning, laundry, driving, shopping, community activity, and yard work  PERSONAL FACTORS: Age, Fitness, Past/current experiences, Time since onset of injury/illness/exacerbation, and 3+ comorbidities: Prediabetes/DM-II, thyroid disease, hx of endometrial CA treated with chemo, osteopenia, OA, HTN, HLD, GERD, BPPV, asthma, anxiety, depression, h/o flank pain and LBP with R LE radiculopathy  are also affecting patient's functional outcome.   REHAB POTENTIAL: Good  CLINICAL DECISION MAKING: Stable/uncomplicated  EVALUATION COMPLEXITY: Low   GOALS: Goals reviewed with patient? Yes  SHORT TERM GOALS: Target date: 01/23/2023  Patient will be independent with  initial HEP to improve outcomes and carryover.  Baseline: Initial HEP provided on eval Goal status: MET  01/18/23  2.  Patient will report centralization of radicular symptoms.  Baseline: Intermittent L LE pain, numbness and tingling down to foot Goal status: MET  01/22/23 - radicular pain now only intermittently into L thigh  LONG TERM GOALS: Target date: 02/13/2023  Patient will be independent with ongoing/advanced HEP for self-management at home.  Baseline:  Goal status: MET  02/13/23   2.  Patient will report 50-75% improvement in low back pain and L LE radiculopathy to improve QOL.  Baseline: 3-4/10 Goal status: MET  02/13/23 - Pt reports 85% improvement  3.  Patient to demonstrate ability to achieve and maintain good spinal alignment/posturing and body mechanics needed for daily activities. Baseline:  Goal status: MET  02/11/23  4.  Patient will demonstrate functional pain free lumbar ROM to perform ADLs.   Baseline: Limited lumbar AROM in all planes except rotation, with pain for all motions except right lateral flexion - refer to above lumbar ROM table Goal status: MET  02/11/23  5.  Patient will demonstrate improved B LE strength to >/= 4+/5 for improved stability and ease of mobility . Baseline: Refer to above LE MMT table Goal status: MET  02/11/23  6.  Patient will report >/= 55 on lumbar FOTO to demonstrate improved functional ability.  Baseline: 44 Goal status: NOT MET  02/13/23 - 52 (improved but goal not fully met)  7. Patient will report </= 34% on Modified Oswestry to demonstrate improved functional ability with decreased pain interference. Baseline: 23 / 50 = 46.0 % Goal status: NOT MET  02/13/23 - 22 / 50 = 44.0 % (minimally improved but goal not met)  8.  Patient will report >/= 35/80 on LEFS to demonstrate improved functional ability with decreased pain interference. Baseline: 26 / 80 = 32.5 % Goal status: MET  02/13/23 - 51 / 80 = 63.7 %  9.  Patient will tolerate  30+ min of standing or walking w/o increased pain to allow for improved mobility and activity tolerance. Baseline: Standing tolerance limited to <30 minutes and only able to walk short distances in home or yard without limitation due to pain Goal status: MET  02/11/23 - Able to walk 1 mile   PLAN:  PT FREQUENCY: 2x/week  PT DURATION: 6 weeks  PLANNED INTERVENTIONS: Therapeutic exercises, Therapeutic activity, Neuromuscular  re-education, Balance training, Gait training, Patient/Family education, Self Care, Joint mobilization, Stair training, DME instructions, Aquatic Therapy, Dry Needling, Electrical stimulation, Spinal manipulation, Spinal mobilization, Cryotherapy, Moist heat, Taping, Traction, Ultrasound, Ionotophoresis 4mg /ml Dexamethasone, Manual therapy, and Re-evaluation  PLAN FOR NEXT SESSION: transition to HEP + 30-day hold   Marry Guan, PT  02/13/2023, 1:49 PM   PHYSICAL THERAPY DISCHARGE SUMMARY  Visits from Start of Care: 9  Current functional level related to goals / functional outcomes: Refer to above clinical impression and goal assessment for status as of last visit on 02/13/23. Patient was placed on hold for 30 days and has not needed to return to PT, therefore will proceed with discharge from PT for this episode.    Remaining deficits: As above.   Education / Equipment: HEP, Hospital doctor education  Patient agrees to discharge. Patient goals were mostly met. Patient is being discharged due to being pleased with the current functional level.  Marry Guan, PT 04/19/23, 12:58 PM  [facility] [address]  [address] [place], [place], [address] Phone: [phone]   Fax:  [phone]

## 2023-03-07 ENCOUNTER — Encounter (INDEPENDENT_AMBULATORY_CARE_PROVIDER_SITE_OTHER): Payer: Self-pay

## 2023-04-10 ENCOUNTER — Encounter: Payer: Self-pay | Admitting: Family Medicine

## 2023-04-10 ENCOUNTER — Ambulatory Visit (INDEPENDENT_AMBULATORY_CARE_PROVIDER_SITE_OTHER): Payer: Medicare Other | Admitting: Family Medicine

## 2023-04-10 ENCOUNTER — Telehealth: Payer: Self-pay | Admitting: Family Medicine

## 2023-04-10 ENCOUNTER — Other Ambulatory Visit (HOSPITAL_BASED_OUTPATIENT_CLINIC_OR_DEPARTMENT_OTHER): Payer: Self-pay

## 2023-04-10 VITALS — BP 128/86 | HR 93 | Temp 98.6°F | Ht 62.0 in | Wt 238.2 lb

## 2023-04-10 DIAGNOSIS — E1169 Type 2 diabetes mellitus with other specified complication: Secondary | ICD-10-CM | POA: Diagnosis not present

## 2023-04-10 DIAGNOSIS — I10 Essential (primary) hypertension: Secondary | ICD-10-CM | POA: Diagnosis not present

## 2023-04-10 DIAGNOSIS — E7800 Pure hypercholesterolemia, unspecified: Secondary | ICD-10-CM

## 2023-04-10 DIAGNOSIS — E669 Obesity, unspecified: Secondary | ICD-10-CM

## 2023-04-10 LAB — COMPREHENSIVE METABOLIC PANEL WITH GFR
ALT: 18 U/L (ref 0–35)
AST: 17 U/L (ref 0–37)
Albumin: 3.8 g/dL (ref 3.5–5.2)
Alkaline Phosphatase: 105 U/L (ref 39–117)
BUN: 12 mg/dL (ref 6–23)
CO2: 29 meq/L (ref 19–32)
Calcium: 8.8 mg/dL (ref 8.4–10.5)
Chloride: 105 meq/L (ref 96–112)
Creatinine, Ser: 0.84 mg/dL (ref 0.40–1.20)
GFR: 69.98 mL/min (ref >60.00)
Glucose, Bld: 112 mg/dL — ABNORMAL HIGH (ref 70–99)
Potassium: 4.2 meq/L (ref 3.5–5.1)
Sodium: 141 meq/L (ref 135–145)
Total Bilirubin: 0.4 mg/dL (ref 0.2–1.2)
Total Protein: 6.5 g/dL (ref 6.0–8.3)

## 2023-04-10 LAB — LIPID PANEL
Cholesterol: 159 mg/dL (ref 0–200)
HDL: 57.2 mg/dL (ref >39.00)
LDL Cholesterol: 80 mg/dL (ref 0–99)
NonHDL: 101.79
Total CHOL/HDL Ratio: 3
Triglycerides: 109 mg/dL (ref 0.0–149.0)
VLDL: 21.8 mg/dL (ref 0.0–40.0)

## 2023-04-10 LAB — HEMOGLOBIN A1C: Hgb A1c MFr Bld: 7.1 % — ABNORMAL HIGH (ref 4.6–6.5)

## 2023-04-10 MED ORDER — RYBELSUS 7 MG PO TABS
7.0000 mg | ORAL_TABLET | Freq: Every day | ORAL | 5 refills | Status: DC
Start: 2023-05-10 — End: 2023-11-03
  Filled 2023-04-10 – 2023-05-06 (×2): qty 30, 30d supply, fill #0
  Filled 2023-06-03: qty 30, 30d supply, fill #1
  Filled 2023-07-01: qty 30, 30d supply, fill #2
  Filled 2023-07-30: qty 30, 30d supply, fill #3
  Filled 2023-08-28: qty 30, 30d supply, fill #4
  Filled 2023-09-25: qty 30, 30d supply, fill #5

## 2023-04-10 MED ORDER — RYBELSUS 3 MG PO TABS
3.0000 mg | ORAL_TABLET | Freq: Every day | ORAL | 0 refills | Status: AC
Start: 2023-04-10 — End: 2023-05-10
  Filled 2023-04-10: qty 30, 30d supply, fill #0

## 2023-04-10 NOTE — Patient Instructions (Addendum)
Give [place] 2-3 business days to get the results of your labs back.   Keep the diet clean and stay active.  Let me know if there are cost issues with the new medication.   Let us know if you need anything.

## 2023-04-10 NOTE — Progress Notes (Signed)
Subjective:   Chief Complaint  Patient presents with   Follow-up    Katelyn Fisher is a 72 y.o. female here for follow-up of diabetes.   Katelyn Fisher does not routinely check her sugars.  Patient does not require insulin.   Medications include: diet controlled Diet is mostly healthy.  Exercise: walking  Hypertension Patient presents for hypertension follow up. She does monitor home blood pressures. Blood pressures ranging on average from 120's/80's. She is compliant with medications- benazepril 20 mg daily, Norvasc 10 mg daily, Coreg 12.5 mg twice daily. Patient has these side effects of medication: none Diet/exercise as above.  No CP or SOB.   Hyperlipidemia Patient presents for hyperlipidemia follow up. Currently being treated with Lipitor 40 mg/d and compliance with treatment thus far has been good. She denies myalgias. Diet/exercise as above.  The patient is not known to have coexisting coronary artery disease.  Past Medical History:  Diagnosis Date   Allergy    SEASONAL   Anxiety    Arthritis    back   Asthma    Depression    Endometrial cancer (HCC) 2015   Family history of breast cancer    Family history of colon cancer    Family history of ovarian cancer    GERD (gastroesophageal reflux disease)    History of colonic diverticulitis    Hx of colonic polyps    Hyperlipidemia    Hypertension    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   Osteopenia 09/2017   T score -1.2 FRAX 3.2% / 0.3%   Personal history of chemotherapy 2015   Radiation 12/17/14-01/03/15   vaginal brachytherapy   Thyroid disease 06/01/2013     Related testing: Retinal exam: Due Pneumovax: done  Objective:  BP 128/86 (BP Location: Left Arm, Cuff Size: Large)   Pulse 93   Temp 98.6 F (37 C) (Oral)   Ht 5\' 2"  (1.575 m)   Wt 238 lb 4 oz (108.1 kg)   SpO2 96%   BMI 43.58 kg/m  General:  Well developed, well nourished, in no apparent distress Lungs:  CTAB, no access msc use Cardio:  RRR,  no bruits Psych: Age appropriate judgment and insight  Assessment:   Type 2 diabetes mellitus with obesity (HCC) - Plan: Comprehensive metabolic panel, Lipid panel, Hemoglobin A1c  Essential hypertension  Pure hypercholesterolemia   Plan:   Chronic, stable. Add Rybelsus 3 mg/d for a month and then 7 mg/d. Ck labs. Counseled on diet and exercise. Chronic, stable. Cont benazepril 20 mg daily, Norvasc 10 mg daily, Coreg 12.5 mg twice daily. Chronic, stable.  Continue Lipitor 40 mg daily. F/u in 6 mo. The patient voiced understanding and agreement to the plan.  Katelyn Roche Eugene, DO 04/10/23 7:07 AM

## 2023-04-10 NOTE — Telephone Encounter (Signed)
[facility] called stating that they are a primary care office and would not be able to facilitate the referral sent to them.

## 2023-04-11 ENCOUNTER — Other Ambulatory Visit (HOSPITAL_BASED_OUTPATIENT_CLINIC_OR_DEPARTMENT_OTHER): Payer: Self-pay

## 2023-04-11 DIAGNOSIS — I8310 Varicose veins of unspecified lower extremity with inflammation: Secondary | ICD-10-CM | POA: Diagnosis not present

## 2023-04-11 MED ORDER — BETAMETHASONE DIPROPIONATE POWD: 0 refills | Status: AC

## 2023-05-06 ENCOUNTER — Other Ambulatory Visit (HOSPITAL_BASED_OUTPATIENT_CLINIC_OR_DEPARTMENT_OTHER): Payer: Self-pay

## 2023-05-06 DIAGNOSIS — M5416 Radiculopathy, lumbar region: Secondary | ICD-10-CM | POA: Diagnosis not present

## 2023-05-06 DIAGNOSIS — M5136 Other intervertebral disc degeneration, lumbar region with discogenic back pain only: Secondary | ICD-10-CM | POA: Diagnosis not present

## 2023-05-06 DIAGNOSIS — Z79899 Other long term (current) drug therapy: Secondary | ICD-10-CM | POA: Diagnosis not present

## 2023-05-06 DIAGNOSIS — Z5181 Encounter for therapeutic drug level monitoring: Secondary | ICD-10-CM | POA: Diagnosis not present

## 2023-05-06 MED ORDER — TRAMADOL HCL 50 MG PO TABS
50.0000 mg | ORAL_TABLET | Freq: Three times a day (TID) | ORAL | 0 refills | Status: AC | PRN
Start: 2023-05-06 — End: ?
  Filled 2023-05-06: qty 21, 7d supply, fill #0
  Filled 2023-07-26: qty 21, 7d supply, fill #1

## 2023-05-09 ENCOUNTER — Ambulatory Visit: Payer: Medicare Other | Admitting: *Deleted

## 2023-05-09 DIAGNOSIS — Z0000 Encounter for general adult medical examination without abnormal findings: Secondary | ICD-10-CM | POA: Diagnosis not present

## 2023-05-09 NOTE — Patient Instructions (Signed)
Katelyn Fisher , Thank you for taking time to come for your Medicare Wellness Visit. I appreciate your ongoing commitment to your health goals. Please review the following plan we discussed and let me know if I can assist you in the future.     This is a list of the screening recommended for you and due dates:  Health Maintenance  Topic Date Due   Flu Shot  10/21/2023*   Yearly kidney health urinalysis for diabetes  10/08/2023   Complete foot exam   10/08/2023   Hemoglobin A1C  10/08/2023   Eye exam for diabetics  01/11/2024   Yearly kidney function blood test for diabetes  04/09/2024   Medicare Annual Wellness Visit  05/08/2024   Mammogram  12/17/2024   DTaP/Tdap/Td vaccine (3 - Td or Tdap) 05/16/2028   Colon Cancer Screening  11/28/2031   Pneumonia Vaccine  Completed   DEXA scan (bone density measurement)  Completed   Hepatitis C Screening  Completed   Zoster (Shingles) Vaccine  Completed   HPV Vaccine  Aged Out   COVID-19 Vaccine  Discontinued  *Topic was postponed. The date shown is not the original due date.    Next appointment: Follow up in one year for your annual wellness visit.   Preventive Care 79 Years and Older, Female Preventive care refers to lifestyle choices and visits with your health care provider that can promote health and wellness. What does preventive care include? A yearly physical exam. This is also called an annual well check. Dental exams once or twice a year. Routine eye exams. Ask your health care provider how often you should have your eyes checked. Personal lifestyle choices, including: Daily care of your teeth and gums. Regular physical activity. Eating a healthy diet. Avoiding tobacco and drug use. Limiting alcohol use. Practicing safe sex. Taking low-dose aspirin every day. Taking vitamin and mineral supplements as recommended by your health care provider. What happens during an annual well check? The services and screenings done by your  health care provider during your annual well check will depend on your age, overall health, lifestyle risk factors, and family history of disease. Counseling  Your health care provider may ask you questions about your: Alcohol use. Tobacco use. Drug use. Emotional well-being. Home and relationship well-being. Sexual activity. Eating habits. History of falls. Memory and ability to understand (cognition). Work and work Astronomer. Reproductive health. Screening  You may have the following tests or measurements: Height, weight, and BMI. Blood pressure. Lipid and cholesterol levels. These may be checked every 5 years, or more frequently if you are over 86 years old. Skin check. Lung cancer screening. You may have this screening every year starting at age 40 if you have a 30-pack-year history of smoking and currently smoke or have quit within the past 15 years. Fecal occult blood test (FOBT) of the stool. You may have this test every year starting at age 64. Flexible sigmoidoscopy or colonoscopy. You may have a sigmoidoscopy every 5 years or a colonoscopy every 10 years starting at age 25. Hepatitis C blood test. Hepatitis B blood test. Sexually transmitted disease (STD) testing. Diabetes screening. This is done by checking your blood sugar (glucose) after you have not eaten for a while (fasting). You may have this done every 1-3 years. Bone density scan. This is done to screen for osteoporosis. You may have this done starting at age 8. Mammogram. This may be done every 1-2 years. Talk to your health care provider about how  often you should have regular mammograms. Talk with your health care provider about your test results, treatment options, and if necessary, the need for more tests. Vaccines  Your health care provider may recommend certain vaccines, such as: Influenza vaccine. This is recommended every year. Tetanus, diphtheria, and acellular pertussis (Tdap, Td) vaccine. You may  need a Td booster every 10 years. Zoster vaccine. You may need this after age 20. Pneumococcal 13-valent conjugate (PCV13) vaccine. One dose is recommended after age 59. Pneumococcal polysaccharide (PPSV23) vaccine. One dose is recommended after age 31. Talk to your health care provider about which screenings and vaccines you need and how often you need them. This information is not intended to replace advice given to you by your health care provider. Make sure you discuss any questions you have with your health care provider. Document Released: 08/05/2015 Document Revised: 03/28/2016 Document Reviewed: 05/10/2015 Elsevier Interactive Patient Education  2017 [employer].  Fall Prevention in the Home Falls can cause injuries. They can happen to people of all ages. There are many things you can do to make your home safe and to help prevent falls. What can I do on the outside of my home? Regularly fix the edges of walkways and driveways and fix any cracks. Remove anything that might make you trip as you walk through a door, such as a raised step or threshold. Trim any bushes or trees on the path to your home. Use bright outdoor lighting. Clear any walking paths of anything that might make someone trip, such as rocks or tools. Regularly check to see if handrails are loose or broken. Make sure that both sides of any steps have handrails. Any raised decks and porches should have guardrails on the edges. Have any leaves, snow, or ice cleared regularly. Use sand or salt on walking paths during winter. Clean up any spills in your garage right away. This includes oil or grease spills. What can I do in the bathroom? Use night lights. Install grab bars by the toilet and in the tub and shower. Do not use towel bars as grab bars. Use non-skid mats or decals in the tub or shower. If you need to sit down in the shower, use a plastic, non-slip stool. Keep the floor dry. Clean up any water that spills on  the floor as soon as it happens. Remove soap buildup in the tub or shower regularly. Attach bath mats securely with double-sided non-slip rug tape. Do not have throw rugs and other things on the floor that can make you trip. What can I do in the bedroom? Use night lights. Make sure that you have a light by your bed that is easy to reach. Do not use any sheets or blankets that are too big for your bed. They should not hang down onto the floor. Have a firm chair that has side arms. You can use this for support while you get dressed. Do not have throw rugs and other things on the floor that can make you trip. What can I do in the kitchen? Clean up any spills right away. Avoid walking on wet floors. Keep items that you use a lot in easy-to-reach places. If you need to reach something above you, use a strong step stool that has a grab bar. Keep electrical cords out of the way. Do not use floor polish or wax that makes floors slippery. If you must use wax, use non-skid floor wax. Do not have throw rugs and other things  on the floor that can make you trip. What can I do with my stairs? Do not leave any items on the stairs. Make sure that there are handrails on both sides of the stairs and use them. Fix handrails that are broken or loose. Make sure that handrails are as long as the stairways. Check any carpeting to make sure that it is firmly attached to the stairs. Fix any carpet that is loose or worn. Avoid having throw rugs at the top or bottom of the stairs. If you do have throw rugs, attach them to the floor with carpet tape. Make sure that you have a light switch at the top of the stairs and the bottom of the stairs. If you do not have them, ask someone to add them for you. What else can I do to help prevent falls? Wear shoes that: Do not have high heels. Have rubber bottoms. Are comfortable and fit you well. Are closed at the toe. Do not wear sandals. If you use a stepladder: Make sure  that it is fully opened. Do not climb a closed stepladder. Make sure that both sides of the stepladder are locked into place. Ask someone to hold it for you, if possible. Clearly mark and make sure that you can see: Any grab bars or handrails. First and last steps. Where the edge of each step is. Use tools that help you move around (mobility aids) if they are needed. These include: Canes. Walkers. Scooters. Crutches. Turn on the lights when you go into a dark area. Replace any light bulbs as soon as they burn out. Set up your furniture so you have a clear path. Avoid moving your furniture around. If any of your floors are uneven, fix them. If there are any pets around you, be aware of where they are. Review your medicines with your doctor. Some medicines can make you feel dizzy. This can increase your chance of falling. Ask your doctor what other things that you can do to help prevent falls. This information is not intended to replace advice given to you by your health care provider. Make sure you discuss any questions you have with your health care provider. Document Released: 05/05/2009 Document Revised: 12/15/2015 Document Reviewed: 08/13/2014 Elsevier Interactive Patient Education  2017 [employer].

## 2023-05-09 NOTE — Progress Notes (Signed)
Subjective:   Katelyn Fisher is a 72 y.o. female who presents for Medicare Annual (Subsequent) preventive examination.  Visit Complete: Virtual I connected with  Katelyn Fisher on 05/09/23 by a audio enabled telemedicine application and verified that I am speaking with the correct person using two identifiers.  Patient Location: Home  Provider Location: Office/Clinic  I discussed the limitations of evaluation and management by telemedicine. The patient expressed understanding and agreed to proceed.  Vital Signs: Because this visit was a virtual/telehealth visit, some criteria may be missing or patient reported. Any vitals not documented were not able to be obtained and vitals that have been documented are patient reported.  Patient Medicare AWV questionnaire was completed by the patient on 05/08/23; I have confirmed that all information answered by patient is correct and no changes since this date.  Cardiac Risk Factors include: advanced age (>29men, >47 women);diabetes mellitus;obesity (BMI >30kg/m2);dyslipidemia;hypertension     Objective:    There were no vitals filed for this visit. There is no height or weight on file to calculate BMI.     05/09/2023    9:00 AM 01/02/2023    8:07 AM 05/07/2022    9:03 AM 12/23/2021    9:21 PM 05/03/2021    9:08 AM 12/17/2019    8:09 AM 06/04/2019   11:02 AM  Advanced Directives  Does Patient Have a Medical Advance Directive? Yes Yes No;Yes Yes Yes No Yes  Type of Estate agent of [place];Living will Healthcare Power of [place];Living will Healthcare Power of [place];Living will Healthcare Power of [place];Living will Healthcare Power of Attorney    Does patient want to make changes to medical advance directive? No - Patient declined No - Patient declined No - Patient declined  Yes (MAU/Ambulatory/Procedural Areas - Information given)  No - Patient declined  Copy of Healthcare Power of Attorney in Chart? Yes -  validated most recent copy scanned in chart (See row information) Yes - validated most recent copy scanned in chart (See row information) Yes - validated most recent copy scanned in chart (See row information)  No - copy requested    Would patient like information on creating a medical advance directive?   No - Patient declined   No - Patient declined     Current Medications (verified) Outpatient Encounter Medications as of 05/09/2023  Medication Sig   albuterol (PROAIR HFA) 108 (90 Base) MCG/ACT inhaler Inhale 2 puffs by mouth into the lungs every 4 (four) hours as needed for coughing or wheezing spells.   albuterol (PROVENTIL) (2.5 MG/3ML) 0.083% nebulizer solution Take 3 mLs (2.5 mg total) by nebulization every 4 (four) hours as needed for wheezing or shortness of breath.   amLODipine (NORVASC) 10 MG tablet TAKE 1 TABLET BY MOUTH DAILY   atorvastatin (LIPITOR) 40 MG tablet TAKE 1 TABLET BY MOUTH DAILY   benazepril (LOTENSIN) 20 MG tablet TAKE 1 TABLET BY MOUTH AT  BEDTIME   betamethasone dipropionate (DIPROLENE) 0.05 % ointment Apply 1 application externally once a day as needed for 30 days.   Betamethasone Dipropionate POWD 1 application Externally Once a day prn 30 days   budesonide-formoterol (SYMBICORT) 160-4.5 MCG/ACT inhaler Inhale 2 puffs into the lungs as needed.   carvedilol (COREG) 12.5 MG tablet TAKE 1 TABLET BY MOUTH TWICE  DAILY WITH A MEAL   EPINEPHrine (EPIPEN 2-PAK) 0.3 mg/0.3 mL IJ SOAJ injection Use as directed for severe allergic reaction   furosemide (LASIX) 20 MG tablet TAKE 1 TABLET  BY MOUTH DAILY   levocetirizine (XYZAL) 5 MG tablet Take 1 tablet (5 mg total) by mouth every evening.   levothyroxine (SYNTHROID) 75 MCG tablet TAKE 1 TABLET (75 MCG TOTAL) BY MOUTH DAILY BEFORE BREAKFAST. Oral for 30   montelukast (SINGULAIR) 10 MG tablet Take 1 tablet (10 mg total) by mouth at bedtime.   Multiple Vitamin (MULTIVITAMIN) tablet Take 1 tablet by mouth daily.   Semaglutide  (RYBELSUS) 3 MG TABS Take 1 tablet (3 mg total) by mouth daily.   [START ON 05/10/2023] Semaglutide (RYBELSUS) 7 MG TABS Take 1 tablet (7 mg total) by mouth daily.   traMADol (ULTRAM) 50 MG tablet Take 1 tablet (50 mg total) by mouth 3 (three) times daily as needed.   traMADol (ULTRAM) 50 MG tablet Take 1 tablet (50 mg total) by mouth 3 (three) times daily as needed.   [DISCONTINUED] fluticasone (FLOVENT HFA) 110 MCG/ACT inhaler Inhale 2 puffs into the lungs in the morning and at bedtime for 14 days.   No facility-administered encounter medications on file as of 05/09/2023.    Allergies (verified) Shellfish allergy, Oxycodone, Penicillins, Iodinated contrast media, and Sulfa antibiotics   History: Past Medical History:  Diagnosis Date   Allergy    SEASONAL   Anxiety    Arthritis    back   Asthma    Depression    Diabetes mellitus without complication (HCC)    Endometrial cancer (HCC) 2015   Family history of breast cancer    Family history of colon cancer    Family history of ovarian cancer    GERD (gastroesophageal reflux disease)    History of colonic diverticulitis    Hx of colonic polyps    Hyperlipidemia    Hypertension    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   Osteopenia 09/2017   T score -1.2 FRAX 3.2% / 0.3%   Personal history of chemotherapy 2015   Radiation 12/17/14-01/03/15   vaginal brachytherapy   Thyroid disease 06/01/2013   Past Surgical History:  Procedure Laterality Date   ABDOMINAL HYSTERECTOMY  06/23/2014   [facility], TRH/BSO   CHOLECYSTECTOMY  1991   COLONOSCOPY     DILATION AND CURETTAGE OF UTERUS     IR REMOVAL TUN ACCESS W/ PORT W/O FL MOD SED  04/30/2017   POLYPECTOMY     TUBAL LIGATION     Family History  Problem Relation Age of Onset   Diabetes Mother    Lupus Mother    Prostate cancer Father 66   Diabetes Father    Cancer Father        lung cancer, asbestos exposure   Hyperlipidemia Sister    Heart disease Brother    Diabetes Brother     Heart disease Brother    Heart disease Brother    Diabetes Brother    Colon polyps Brother    Heart disease Brother    Diabetes Brother    Colon polyps Brother    Heart disease Maternal Aunt        x 3 -2 brothers   Hypertension Maternal Aunt    Breast cancer Maternal Aunt        maternal half; dx in her 74s   Cancer Maternal Aunt        cervical   Ovarian cancer Maternal Aunt        dx in her 80s   Leukemia Maternal Uncle 21   Cancer Paternal Aunt        NOS- breast    Prostate  cancer Paternal Uncle        dx in his 59s   Colon cancer Maternal Grandmother        dx in her 49s   Breast cancer Other        dx in her 67s   Social History   Socioeconomic History   Marital status: Married    Spouse name: Not on file   Number of children: 3   Years of education: Not on file   Highest education level: Some college, no degree  Occupational History   Occupation: retired Diplomatic Services operational officer  Tobacco Use   Smoking status: Never    Passive exposure: Never   Smokeless tobacco: Never  Vaping Use   Vaping status: Never Used  Substance and Sexual Activity   Alcohol use: No    Alcohol/week: 0.0 standard drinks of alcohol   Drug use: No   Sexual activity: Yes    Partners: Male    Birth control/protection: Surgical    Comment: 1st intercourse- 15, partners- 3, hysterectomy  Other Topics Concern   Not on file  Social History Narrative   Married- 40 years   Never Smoked   Alcohol use-no   Drug use-no   Occupation: housewife   Caffeine use/day:  None   Does Patient Exercise:  yes   Social Determinants of Health   Financial Resource Strain: Low Risk  (05/08/2023)   Overall Financial Resource Strain (CARDIA)    Difficulty of Paying Living Expenses: Not very hard  Food Insecurity: No Food Insecurity (05/08/2023)   Hunger Vital Sign    Worried About Running Out of Food in the Last Year: Never true    Ran Out of Food in the Last Year: Never true  Transportation Needs: No  Transportation Needs (05/08/2023)   PRAPARE - Administrator, Civil Service (Medical): No    Lack of Transportation (Non-Medical): No  Physical Activity: Insufficiently Active (05/08/2023)   Exercise Vital Sign    Days of Exercise per Week: 3 days    Minutes of Exercise per Session: 30 min  Stress: No Stress Concern Present (05/08/2023)   [employer] of Occupational Health - Occupational Stress Questionnaire    Feeling of Stress : Only a little  Social Connections: Socially Integrated (05/08/2023)   Social Connection and Isolation Panel [NHANES]    Frequency of Communication with Friends and Family: More than three times a week    Frequency of Social Gatherings with Friends and Family: More than three times a week    Attends Religious Services: More than 4 times per year    Active Member of [employer] or Organizations: Yes    Attends Engineer, structural: More than 4 times per year    Marital Status: Married    Tobacco Counseling Counseling given: Not Answered   Clinical Intake:  Pre-visit preparation completed: Yes  Pain : No/denies pain  Nutritional Risks: None Diabetes: Yes CBG done?: No Did pt. bring in CBG monitor from home?: No  How often do you need to have someone help you when you read instructions, pamphlets, or other written materials from your doctor or pharmacy?: 2 - Rarely  Interpreter Needed?: No  Information entered by :: Donne Anon, CMA   Activities of Daily Living    05/08/2023    5:22 PM  In your present state of health, do you have any difficulty performing the following activities:  Hearing? 0  Vision? 0  Difficulty concentrating or making decisions? 0  Walking or climbing stairs? 1  Dressing or bathing? 0  Doing errands, shopping? 1  Comment niece Insurance claims handler and eating ? N  Using the Toilet? N  In the past six months, have you accidently leaked urine? Y  Do you have problems with loss of bowel control?  N  Managing your Medications? N  Managing your Finances? N  Housekeeping or managing your Housekeeping? Y    Patient Care Team: Sharlene Dory, DO as PCP - General (Family Medicine) Sheran Luz, MD as Consulting Physician (Physical Medicine and Rehabilitation) Reece Packer, MD as Consulting Physician (Oncology) Laurette Schimke, MD as Attending Physician (Obstetrics and Gynecology) Lonie Peak, MD as Attending Physician (Radiation Oncology) Myra Rude, MD as Consulting Physician (Family Medicine) Genia Del, MD as Consulting Physician (Obstetrics and Gynecology)  Indicate any recent Medical Services you may have received from other than [facility] providers in the past year (date may be approximate).     Assessment:   This is a routine wellness examination for Meiah.  Hearing/Vision screen No results found.   Goals Addressed   None    Depression Screen    05/09/2023    9:04 AM 10/08/2022    7:04 AM 05/07/2022    9:03 AM 05/03/2021    9:13 AM 12/21/2020    2:26 PM 12/17/2019    8:13 AM 12/16/2018    8:13 AM  PHQ 2/9 Scores  PHQ - 2 Score 0 0 0 0 0 0 0  PHQ- 9 Score  0         Fall Risk    05/08/2023    5:22 PM 10/31/2022    9:23 AM 10/08/2022    7:04 AM 05/07/2022    9:03 AM 05/03/2021    9:11 AM  Fall Risk   Falls in the past year? 0 0 0 0 0  Number falls in past yr: 0 0 0 0 0  Injury with Fall? 0 0 0 0 0  Risk for fall due to : No Fall Risks No Fall Risks No Fall Risks No Fall Risks No Fall Risks  Follow up Falls evaluation completed Falls evaluation completed Falls evaluation completed Falls evaluation completed Falls evaluation completed    MEDICARE RISK AT HOME: Medicare Risk at Home Any stairs in or around the home?: No If so, are there any without handrails?: No Home free of loose throw rugs in walkways, pet beds, electrical cords, etc?: Yes Adequate lighting in your home to reduce risk of falls?: Yes Life alert?: No Use of  a cane, walker or w/c?: No Grab bars in the bathroom?: Yes Shower chair or bench in shower?: Yes Elevated toilet seat or a handicapped toilet?: No  TIMED UP AND GO:  Was the test performed?  No    Cognitive Function:    09/09/2017    8:07 AM  MMSE - Mini Mental State Exam  Orientation to time 5  Orientation to Place 5  Registration 3  Attention/ Calculation 5  Recall 3  Language- name 2 objects 2  Language- repeat 1  Language- follow 3 step command 3  Language- read & follow direction 1  Write a sentence 1  Copy design 1  Total score 30        05/09/2023    9:06 AM 05/07/2022    9:08 AM  6CIT Screen  What Year? 0 points 0 points  What month? 0 points 0 points  What time? 0 points 0 points  Count back from 20 0 points 0 points  Months in reverse 0 points 0 points  Repeat phrase 0 points 0 points  Total Score 0 points 0 points    Immunizations Immunization History  Administered Date(s) Administered   Fluad Quad(high Dose 65+) 04/30/2019, 05/11/2020, 05/11/2021, 05/10/2022   Influenza Split 06/11/2011, 05/21/2012   Influenza Whole 06/24/2007, 04/16/2008, 05/02/2010   Influenza, High Dose Seasonal PF 05/21/2017   Influenza,inj,Quad PF,6+ Mos 06/01/2013, 05/19/2015, 03/30/2016, 05/16/2018   Influenza-Unspecified 05/23/2014, 05/21/2017   PFIZER(Purple Top)SARS-COV-2 Vaccination 08/29/2019, 09/19/2019, 05/16/2020   PNEUMOCOCCAL CONJUGATE-20 10/04/2021   Pneumococcal Conjugate-13 05/16/2018   Pneumococcal Polysaccharide-23 09/24/2016   Td 09/26/2007   Tdap 05/16/2018   Zoster Recombinant(Shingrix) 01/25/2021, 11/15/2021    TDAP status: Up to date  Flu Vaccine status: Due, Education has been provided regarding the importance of this vaccine. Advised may receive this vaccine at local pharmacy or Health Dept. Aware to provide a copy of the vaccination record if obtained from local pharmacy or Health Dept. Verbalized acceptance and understanding.  Pneumococcal  vaccine status: Up to date  Covid-19 vaccine status: Declined, Education has been provided regarding the importance of this vaccine but patient still declined. Advised may receive this vaccine at local pharmacy or Health Dept.or vaccine clinic. Aware to provide a copy of the vaccination record if obtained from local pharmacy or Health Dept. Verbalized acceptance and understanding.  Qualifies for Shingles Vaccine? Yes   Zostavax completed No   Shingrix Completed?: Yes  Screening Tests Health Maintenance  Topic Date Due   Medicare Annual Wellness (AWV)  05/08/2023   INFLUENZA VACCINE  10/21/2023 (Originally 02/21/2023)   Diabetic kidney evaluation - Urine ACR  10/08/2023   FOOT EXAM  10/08/2023   HEMOGLOBIN A1C  10/08/2023   OPHTHALMOLOGY EXAM  01/11/2024   Diabetic kidney evaluation - eGFR measurement  04/09/2024   MAMMOGRAM  12/17/2024   DTaP/Tdap/Td (3 - Td or Tdap) 05/16/2028   Colonoscopy  11/28/2031   Pneumonia Vaccine 28+ Years old  Completed   DEXA SCAN  Completed   Hepatitis C Screening  Completed   Zoster Vaccines- Shingrix  Completed   HPV VACCINES  Aged Out   COVID-19 Vaccine  Discontinued    Health Maintenance  Health Maintenance Due  Topic Date Due   Medicare Annual Wellness (AWV)  05/08/2023    Colorectal cancer screening: Type of screening: Colonoscopy. Completed 11/27/21. Repeat every 10 years  Mammogram status: Completed 12/18/22. Repeat every year  Bone Density status: Completed 10/25/21. Results reflect: Bone density results: OSTEOPENIA. Repeat every 2 years.  Lung Cancer Screening: (Low Dose CT Chest recommended if Age 61-80 years, 20 pack-year currently smoking OR have quit w/in 15years.) does not qualify.   Additional Screening:  Hepatitis C Screening: does qualify; Completed 09/24/16  Vision Screening: Recommended annual ophthalmology exams for early detection of glaucoma and other disorders of the eye. Is the patient up to date with their annual eye  exam?  Yes  Who is the provider or what is the name of the office in which the patient attends annual eye exams? [facility] If pt is not established with a provider, would they like to be referred to a provider to establish care? No .   Dental Screening: Recommended annual dental exams for proper oral hygiene  Diabetic Foot Exam: Diabetic Foot Exam: Completed 10/08/22  Community Resource Referral / Chronic Care Management: CRR required this visit?  No   CCM required this visit?  No     Plan:  I have personally reviewed and noted the following in the patient's chart:   Medical and social history Use of alcohol, tobacco or illicit drugs  Current medications and supplements including opioid prescriptions. Patient is not currently taking opioid prescriptions. Functional ability and status Nutritional status Physical activity Advanced directives List of other physicians Hospitalizations, surgeries, and ER visits in previous 12 months Vitals Screenings to include cognitive, depression, and falls Referrals and appointments  In addition, I have reviewed and discussed with patient certain preventive protocols, quality metrics, and best practice recommendations. A written personalized care plan for preventive services as well as general preventive health recommendations were provided to patient.     Donne Anon, CMA   05/09/2023   After Visit Summary: (MyChart) Due to this being a telephonic visit, the after visit summary with patients personalized plan was offered to patient via MyChart   Nurse Notes: None

## 2023-06-03 ENCOUNTER — Other Ambulatory Visit (HOSPITAL_BASED_OUTPATIENT_CLINIC_OR_DEPARTMENT_OTHER): Payer: Self-pay

## 2023-06-13 ENCOUNTER — Ambulatory Visit (INDEPENDENT_AMBULATORY_CARE_PROVIDER_SITE_OTHER): Payer: Medicare Other

## 2023-06-13 DIAGNOSIS — Z23 Encounter for immunization: Secondary | ICD-10-CM | POA: Diagnosis not present

## 2023-07-01 ENCOUNTER — Ambulatory Visit (INDEPENDENT_AMBULATORY_CARE_PROVIDER_SITE_OTHER): Payer: Medicare Other | Admitting: Family Medicine

## 2023-07-01 ENCOUNTER — Ambulatory Visit (HOSPITAL_BASED_OUTPATIENT_CLINIC_OR_DEPARTMENT_OTHER)
Admission: RE | Admit: 2023-07-01 | Discharge: 2023-07-01 | Disposition: A | Discharge status: Home / Self Care | Payer: Medicare Other | Source: Ambulatory Visit | Attending: Family Medicine | Admitting: Family Medicine

## 2023-07-01 ENCOUNTER — Other Ambulatory Visit (HOSPITAL_BASED_OUTPATIENT_CLINIC_OR_DEPARTMENT_OTHER): Payer: Self-pay

## 2023-07-01 ENCOUNTER — Encounter: Payer: Self-pay | Admitting: Family Medicine

## 2023-07-01 VITALS — BP 128/82 | HR 78 | Temp 98.0°F | Resp 16 | Ht 62.0 in | Wt 227.8 lb

## 2023-07-01 DIAGNOSIS — M25561 Pain in right knee: Secondary | ICD-10-CM

## 2023-07-01 DIAGNOSIS — M1711 Unilateral primary osteoarthritis, right knee: Secondary | ICD-10-CM | POA: Diagnosis not present

## 2023-07-01 NOTE — Patient Instructions (Signed)
Ice/cold pack over area for 10-15 min twice daily.  Heat (pad or rice pillow in microwave) over affected area, 10-15 minutes twice daily.   OK to take Tylenol 1000 mg (2 extra strength tabs) or 975 mg (3 regular strength tabs) every 6 hours as needed.  Let us know if you need anything.  

## 2023-07-01 NOTE — Progress Notes (Signed)
Musculoskeletal Exam  Patient: Katelyn Fisher DOB: [birth year]  DOS: 07/01/2023  SUBJECTIVE:  Chief Complaint:   Chief Complaint  Patient presents with   Knee Pain    Knee pain    Katelyn Fisher is a 72 y.o.  female for evaluation and treatment of R knee pain.   Onset:  3 days ago. Banged knee into corner of a table Location: R knee/tibia area Character:  sharp and stabbing  Progression of issue:  is unchanged Associated symptoms: feels grinding No bruising, redness, swelling Treatment: to date has been tramadol, Voltaren gel .   Neurovascular symptoms: no  Past Medical History:  Diagnosis Date   Allergy    SEASONAL   Anxiety    Arthritis    back   Asthma    Depression    Diabetes mellitus without complication (HCC)    Endometrial cancer (HCC) 2015   Family history of breast cancer    Family history of colon cancer    Family history of ovarian cancer    GERD (gastroesophageal reflux disease)    History of colonic diverticulitis    Hx of colonic polyps    Hyperlipidemia    Hypertension    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   Osteopenia 09/2017   T score -1.2 FRAX 3.2% / 0.3%   Personal history of chemotherapy 2015   Radiation 12/17/14-01/03/15   vaginal brachytherapy   Thyroid disease 06/01/2013    Objective: VITAL SIGNS: BP 128/82 (BP Location: Left Arm, Patient Position: Sitting, Cuff Size: Normal)   Pulse 78   Temp 98 F (36.7 C) (Oral)   Resp 16   Ht 5\' 2"  (1.575 m)   Wt 227 lb 12.8 oz (103.3 kg)   SpO2 98%   BMI 41.67 kg/m  Constitutional: Well formed, well developed. No acute distress. Thorax & Lungs: No accessory muscle use Musculoskeletal: R knee.   Normal active range of motion: Yes Normal passive range of motion: yes Tenderness to palpation: Yes over the lateral tibial plateau and femoral condyle Deformity: no Ecchymosis: no Neurologic: Normal sensory function.  Gait is antalgic. Psychiatric: Normal mood. Age appropriate judgment and  insight. Alert & oriented x 3.    Assessment:  Acute pain of right knee - Plan: DG Knee Complete 4 Views Right  Plan: Check x-ray given trauma and agitated gait.  Imaging reveals, unofficially, degenerative changes without obvious fracture or dislocation.  Continue tramadol as needed, heat, ice, Tylenol.  F/u pending above. The patient voiced understanding and agreement to the plan.   Jilda Roche [place], DO 07/01/23  3:18 PM

## 2023-07-03 ENCOUNTER — Other Ambulatory Visit (HOSPITAL_BASED_OUTPATIENT_CLINIC_OR_DEPARTMENT_OTHER): Payer: Self-pay

## 2023-07-09 ENCOUNTER — Other Ambulatory Visit (HOSPITAL_BASED_OUTPATIENT_CLINIC_OR_DEPARTMENT_OTHER): Payer: Self-pay

## 2023-07-09 DIAGNOSIS — I8310 Varicose veins of unspecified lower extremity with inflammation: Secondary | ICD-10-CM | POA: Diagnosis not present

## 2023-07-09 MED ORDER — BETAMETHASONE DIPROPIONATE 0.05 % EX OINT
1.0000 | TOPICAL_OINTMENT | Freq: Every day | CUTANEOUS | 0 refills | Status: DC
  Filled 2023-07-09: qty 90, 30d supply, fill #0

## 2023-07-09 MED ORDER — BETAMETHASONE DIPROPIONATE 0.05 % EX OINT
1.0000 | TOPICAL_OINTMENT | Freq: Every day | CUTANEOUS | 0 refills | Status: DC | PRN
  Filled 2023-07-09: qty 90, 90d supply, fill #0

## 2023-07-10 ENCOUNTER — Other Ambulatory Visit (HOSPITAL_BASED_OUTPATIENT_CLINIC_OR_DEPARTMENT_OTHER): Payer: Self-pay

## 2023-07-12 ENCOUNTER — Telehealth: Payer: Self-pay

## 2023-07-12 NOTE — Telephone Encounter (Signed)
Copied from CRM [id number]. Topic: Clinical - Lab/Test Results >> Jul 12, 2023 10:57 AM Pascal Lux wrote: Reason for CRM: Patient called regarding lab results that were done on 07/01/23. I advise results were not in just yet. Reached out to clinic access line and was advise to let patient know radiologist is short right now and 21 days behind but results will be added to MyChart once completed. Patient stated she is traveling for the holiday and wanted to know because she is in pain and won't have access to her MyChart until she returns. I offered to transfer patient to nurse line since pain level was 8/10, she stated she is fine and will take a pain medication for now. -- Requesting a call back when results are completed.

## 2023-07-12 NOTE — Telephone Encounter (Signed)
Happy to offer an injection. I looked at her film and see degenerative changes, nothing broken or dislocated. Physical therapy is another option. Ty.

## 2023-07-12 NOTE — Telephone Encounter (Signed)
Called pt lvm to give the office a call back to discuss results.

## 2023-07-14 ENCOUNTER — Encounter: Payer: Self-pay | Admitting: Family Medicine

## 2023-07-26 ENCOUNTER — Other Ambulatory Visit (HOSPITAL_BASED_OUTPATIENT_CLINIC_OR_DEPARTMENT_OTHER): Payer: Self-pay

## 2023-07-26 ENCOUNTER — Ambulatory Visit (INDEPENDENT_AMBULATORY_CARE_PROVIDER_SITE_OTHER): Payer: Medicare Other | Admitting: Family Medicine

## 2023-07-26 VITALS — BP 130/84 | HR 78 | Temp 98.0°F | Resp 16 | Ht 62.0 in | Wt 222.2 lb

## 2023-07-26 DIAGNOSIS — M25561 Pain in right knee: Secondary | ICD-10-CM | POA: Diagnosis not present

## 2023-07-26 MED ORDER — METHYLPREDNISOLONE ACETATE 40 MG/ML IJ SUSP
40.0000 mg | Freq: Once | INTRAMUSCULAR | Status: AC
Start: 2023-07-26 — End: 2023-07-26
  Administered 2023-07-26: 40 mg via INTRAMUSCULAR

## 2023-07-26 NOTE — Patient Instructions (Signed)
 Ice/cold pack over area for 10-15 min twice daily.  OK to take Tylenol 1000 mg (2 extra strength tabs) or 975 mg (3 regular strength tabs) every 6 hours as needed.  Let us know if you need anything.

## 2023-07-26 NOTE — Progress Notes (Signed)
 Musculoskeletal Exam  Patient: Katelyn Fisher DOB: 10-Oct-1950  DOS: 07/26/2023  SUBJECTIVE:  Chief Complaint:   Chief Complaint  Patient presents with   Knee Pain    Knee pain    Katelyn Fisher is a 73 y.o.  female for evaluation and treatment of R knee pain.   Onset:  4 weeks ago. Hit knee on table a few weeks ago. Hx of OA Location: R knee anteriorly Character:  sharp and stabbing  Progression of issue:  is unchanged Associated symptoms: swelling Treatment: to date has been rest, ice, and OTC NSAIDS.   Neurovascular symptoms: no  Past Medical History:  Diagnosis Date   Allergy    SEASONAL   Anxiety    Arthritis    back   Asthma    Depression    Diabetes mellitus without complication (HCC)    Endometrial cancer (HCC) 2015   Family history of breast cancer    Family history of colon cancer    Family history of ovarian cancer    GERD (gastroesophageal reflux disease)    History of colonic diverticulitis    Hx of colonic polyps    Hyperlipidemia    Hypertension    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   Osteopenia 09/2017   T score -1.2 FRAX 3.2% / 0.3%   Personal history of chemotherapy 2015   Radiation 12/17/14-01/03/15   vaginal brachytherapy   Thyroid  disease 06/01/2013    Objective: VITAL SIGNS: BP 130/84   Pulse 78   Temp 98 F (36.7 C) (Oral)   Resp 16   Ht 5' 2 (1.575 m)   Wt 222 lb 3.2 oz (100.8 kg)   SpO2 97%   BMI 40.64 kg/m  Constitutional: Well formed, well developed. No acute distress. Thorax & Lungs: No accessory muscle use Musculoskeletal: R knee.   Normal active range of motion: yes.   Normal passive range of motion: yes Tenderness to palpation: mild over lateral anterior knee Deformity: no Ecchymosis: no Tests positive: none  Tests negative: Lachman's, Stine's Neurologic: Normal sensory function. Gait cautious.  Psychiatric: Normal mood. Age appropriate judgment and insight. Alert & oriented x 3.    Procedure Note; Knee  injection Verbal consent obtained. The area of the antero-lateral joint line was palpated and cleaned with alcohol x1. A 27-gauge needle was used to enter the joint space anterolaterally with ease. 40 mg of Depomedrol with 2 mL of 1% lidocaine  was injected. The patient tolerated the procedure well. There were no complications noted.  Assessment:  Right knee pain, unspecified chronicity - Plan: PR ARTHROCENTESIS ASPIR&/INJ MAJOR JT/BURSA W/O US , methylPREDNISolone  acetate (DEPO-MEDROL ) injection 40 mg  Plan: Worsening of chronic issue.  Steroid injection today.  Activity as tolerated, ice, Tylenol .  Let me know if no improvement.  Could consider physical therapy versus referral. F/u as originally scheduled. The patient voiced understanding and agreement to the plan.   Mabel [place], DO 07/26/23  8:56 AM

## 2023-07-29 NOTE — Patient Instructions (Addendum)
 Asthma Continue montelukast  (Singulair ) 10 mg once a day to prevent cough or wheeze Continue Symbicort  160-2 puffs once a day to prevent cough or wheeze Continue albuterol  2 puffs every 4 hours as needed for cough or wheeze OR Instead use albuterol  0.083% solution via nebulizer one unit vial every 4 hours as needed for cough or wheeze  You may use albuterol  2 puffs 5 to 15 minutes before activity to decrease cough or wheeze For asthma flare, increase Symbicort  160 to 2 puffs twice a day for 1 to 2 weeks or until cough and wheeze free, then return to original dosing  Allergic rhinitis Continue allergen avoidance measures directed toward dust mites as listed below Continue flonase  nasal spray 1-2 sprays each nostril once a day as needed for stuffy nose.  In the right nostril, point the applicator out toward the right ear. In the left nostril, point the applicator out toward the left ear Continue levocetirizine 5 mg one tablet once a day as needed for runny nose or itching May use saline nasal rinses as needed For thick post nasal drainage, begin Mucinex  [phone] mg twice a day for about 1 week Consider updating your environmental allergy testing.  Remember to stop antihistamines for 3 days before your skin testing appointment.  Reflux Continue dietary and lifestyle modifications as listed below  Food allergy Continue to avoid shellfish.  In case of an allergic reaction, give Benadryl  50 mg  every 4 hours, and if life-threatening symptoms occur, inject with EpiPen  0.3 mg. Return to the clinic if you are interested in updating your food allergy testing.  Remember to stop antihistamines for 3 days before your food testing appointment.  Call the clinic if this treatment plan is not working well for you  Follow up in 6 months or sooner if needed.   Control of Dust Mite Allergen Dust mites play a major role in allergic asthma and rhinitis. They occur in environments with high humidity wherever  human skin is found. Dust mites absorb humidity from the atmosphere (ie, they do not drink) and feed on organic matter (including shed human and animal skin). Dust mites are a microscopic type of insect that you cannot see with the naked eye. High levels of dust mites have been detected from mattresses, pillows, carpets, upholstered furniture, bed covers, clothes, soft toys and any woven material. The principal allergen of the dust mite is found in its feces. A gram of dust may contain 1,000 mites and 250,000 fecal particles. Mite antigen is easily measured in the air during house cleaning activities. Dust mites do not bite and do not cause harm to humans, other than by triggering allergies/asthma.  Ways to decrease your exposure to dust mites in your home:  1. Encase mattresses, box springs and pillows with a mite-impermeable barrier or cover  2. Wash sheets, blankets and drapes weekly in hot water (130 F) with detergent and dry them in a dryer on the hot setting.  3. Have the room cleaned frequently with a vacuum cleaner and a damp dust-mop. For carpeting or rugs, vacuuming with a vacuum cleaner equipped with a high-efficiency particulate air (HEPA) filter. The dust mite allergic individual should not be in a room which is being cleaned and should wait 1 hour after cleaning before going into the room.  4. Do not sleep on upholstered furniture (eg, couches).  5. If possible removing carpeting, upholstered furniture and drapery from the home is ideal. Horizontal blinds should be eliminated in the rooms  where the person spends the most time (bedroom, study, television room). Washable vinyl, roller-type shades are optimal.  6. Remove all non-washable stuffed toys from the bedroom. Wash stuffed toys weekly like sheets and blankets above.  7. Reduce indoor humidity to less than 50%. Inexpensive humidity monitors can be purchased at most hardware stores. Do not use a humidifier as can make the problem  worse and are not recommended.

## 2023-07-29 NOTE — Progress Notes (Signed)
 [address] [place] [address] Dept: [phone]  FOLLOW UP NOTE  Patient ID: Katelyn Fisher, female    DOB: [birth year]  Age: 73 y.o. MRN: [id number] Date of Office Visit: 07/30/2023  Assessment  Chief Complaint: Follow-up (1 month follow up  doing well refill inhalers. Has lost 4 lbs) and Medication Refill  HPI Katelyn Fisher is a 73 year old female who presents to the clinic for follow-up visit.  She was last seen in this clinic on 01/29/2023 by Arlean Mutter, FNP, for evaluation of asthma, allergic rhinitis, reflux, and food allergy to shellfish.  At today's visit, she reports her asthma has been well-controlled with no symptoms including shortness of breath, cough, or wheeze with activity or rest.  She continues montelukast  10 mg once a day, Symbicort  160-2 puffs once a day, and rarely uses albuterol  for relief of asthma symptoms.  Allergic rhinitis is reported as moderately well-controlled with only occasional clear rhinorrhea or nasal congestion.  She continues levocetirizine daily and occasionally uses Flonase  and nasal saline rinses for relief of allergy symptoms. Her last environmental allergy skin testing was on 05/25/2010 and was positive to dust mite and negative to the remainder of the adult environmental panel.  Reflux is reported as well-controlled with no symptoms including heartburn or vomiting.  She is not currently taking a medication to control reflux.  She continues to avoid shellfish with no accidental ingestion or EpiPen  use since her last visit to this clinic.  EpiPen  set is out of date and will be reordered at today's visit.  Her current medications are listed in the chart.  Drug Allergies:  Allergies  Allergen Reactions   Shellfish Allergy Hives, Shortness Of Breath and Swelling   Oxycodone Swelling    Facial swelling and tightness   Penicillins Swelling    Rash with welps   Iodinated Contrast Media Rash   Sulfa Antibiotics Rash    Physical  Exam: BP 128/76   Pulse 77   Temp 98.6 F (37 C) (Temporal)   Resp 18   Ht 5' 1 (1.549 m)   Wt 218 lb 9.6 oz (99.2 kg)   SpO2 99%   BMI 41.30 kg/m    Physical Exam Vitals reviewed.  Constitutional:      Appearance: Normal appearance.  HENT:     Head: Normocephalic and atraumatic.     Right Ear: Tympanic membrane normal.     Left Ear: Tympanic membrane normal.     Nose:     Comments: Bilateral nares slightly erythematous with thin clear nasal drainage noted.  Pharynx normal.  Ears normal.  Eyes normal.    Mouth/Throat:     Pharynx: Oropharynx is clear.  Eyes:     Conjunctiva/sclera: Conjunctivae normal.  Cardiovascular:     Rate and Rhythm: Normal rate and regular rhythm.     Heart sounds: Normal heart sounds. No murmur heard. Pulmonary:     Effort: Pulmonary effort is normal.     Breath sounds: Normal breath sounds.     Comments: Lungs clear to auscultation Musculoskeletal:        General: Normal range of motion.     Cervical back: Normal range of motion and neck supple.  Skin:    General: Skin is warm and dry.  Neurological:     Mental Status: She is alert and oriented to person, place, and time.  Psychiatric:        Mood and Affect: Mood normal.  Behavior: Behavior normal.        Thought Content: Thought content normal.        Judgment: Judgment normal.     Diagnostics: FVC 1.60 which is 74% of predicted value, FEV1 1.25 which is 74% predicted value.  Spirometry indicates normal ventilatory function.  Assessment and Plan: 1. Moderate persistent asthma without complication   2. Perennial allergic rhinitis   3. Gastroesophageal reflux disease without esophagitis   4. Anaphylactic shock due to food, subsequent encounter     Meds ordered this encounter  Medications   albuterol  (PROAIR  HFA) 108 (90 Base) MCG/ACT inhaler    Sig: Inhale 2 puffs by mouth into the lungs every 4 (four) hours as needed for coughing or wheezing spells.    Dispense:  6.7 g     Refill:  1   EPINEPHrine  (EPIPEN  2-PAK) 0.3 mg/0.3 mL IJ SOAJ injection    Sig: Use as directed for severe allergic reaction.    Dispense:  2 each    Refill:  3    Keep rx on file.  Patient will call for fill.    Patient Instructions  Asthma Continue montelukast  (Singulair ) 10 mg once a day to prevent cough or wheeze Continue Symbicort  160-2 puffs once a day to prevent cough or wheeze Continue albuterol  2 puffs every 4 hours as needed for cough or wheeze OR Instead use albuterol  0.083% solution via nebulizer one unit vial every 4 hours as needed for cough or wheeze  You may use albuterol  2 puffs 5 to 15 minutes before activity to decrease cough or wheeze For asthma flare, increase Symbicort  160 to 2 puffs twice a day for 1 to 2 weeks or until cough and wheeze free, then return to original dosing  Allergic rhinitis Continue allergen avoidance measures directed toward dust mites as listed below Continue flonase  nasal spray 1-2 sprays each nostril once a day as needed for stuffy nose.  In the right nostril, point the applicator out toward the right ear. In the left nostril, point the applicator out toward the left ear Continue levocetirizine 5 mg one tablet once a day as needed for runny nose or itching May use saline nasal rinses as needed For thick post nasal drainage, begin Mucinex  [phone] mg twice a day for about 1 week Consider updating your environmental allergy testing.  Remember to stop antihistamines for 3 days before your skin testing appointment.  Reflux Continue dietary and lifestyle modifications as listed below  Food allergy Continue to avoid shellfish.  In case of an allergic reaction, give Benadryl  50 mg  every 4 hours, and if life-threatening symptoms occur, inject with EpiPen  0.3 mg. Return to the clinic if you are interested in updating your food allergy testing.  Remember to stop antihistamines for 3 days before your food testing appointment.  Call the clinic if this  treatment plan is not working well for you  Follow up in 6 months or sooner if needed.   Return in about 6 months (around 01/27/2024), or if symptoms worsen or fail to improve.    Thank you for the opportunity to care for this patient.  Please do not hesitate to contact me with questions.  Arlean Mutter, FNP [facility] 

## 2023-07-30 ENCOUNTER — Ambulatory Visit: Payer: Medicare Other | Admitting: Family Medicine

## 2023-07-30 ENCOUNTER — Other Ambulatory Visit (HOSPITAL_BASED_OUTPATIENT_CLINIC_OR_DEPARTMENT_OTHER): Payer: Self-pay

## 2023-07-30 ENCOUNTER — Other Ambulatory Visit: Payer: Self-pay

## 2023-07-30 ENCOUNTER — Encounter: Payer: Self-pay | Admitting: Family Medicine

## 2023-07-30 VITALS — BP 128/76 | HR 77 | Temp 98.6°F | Resp 18 | Ht 61.0 in | Wt 218.6 lb

## 2023-07-30 DIAGNOSIS — K219 Gastro-esophageal reflux disease without esophagitis: Secondary | ICD-10-CM | POA: Diagnosis not present

## 2023-07-30 DIAGNOSIS — J3089 Other allergic rhinitis: Secondary | ICD-10-CM

## 2023-07-30 DIAGNOSIS — J4540 Moderate persistent asthma, uncomplicated: Secondary | ICD-10-CM

## 2023-07-30 DIAGNOSIS — T7800XD Anaphylactic reaction due to unspecified food, subsequent encounter: Secondary | ICD-10-CM

## 2023-07-30 MED ORDER — EPINEPHRINE 0.3 MG/0.3ML IJ SOAJ
INTRAMUSCULAR | 3 refills | Status: AC
  Filled 2023-07-30: qty 2, 30d supply, fill #0
  Filled 2024-02-03: qty 2, 15d supply, fill #0

## 2023-07-30 MED ORDER — ALBUTEROL SULFATE HFA 108 (90 BASE) MCG/ACT IN AERS
2.0000 | INHALATION_SPRAY | RESPIRATORY_TRACT | 1 refills | Status: DC | PRN
  Filled 2023-07-30: qty 6.7, 20d supply, fill #0
  Filled 2024-02-03: qty 6.7, 20d supply, fill #1

## 2023-07-31 ENCOUNTER — Other Ambulatory Visit (HOSPITAL_BASED_OUTPATIENT_CLINIC_OR_DEPARTMENT_OTHER): Payer: Self-pay

## 2023-08-01 ENCOUNTER — Telehealth: Payer: Self-pay | Admitting: Family Medicine

## 2023-08-01 NOTE — Telephone Encounter (Signed)
 Pt request a call back from [facility]

## 2023-08-01 NOTE — Telephone Encounter (Signed)
 Patient calling letting me know that she notified her pcp for me.

## 2023-08-28 ENCOUNTER — Other Ambulatory Visit (HOSPITAL_BASED_OUTPATIENT_CLINIC_OR_DEPARTMENT_OTHER): Payer: Self-pay

## 2023-08-28 ENCOUNTER — Other Ambulatory Visit: Payer: Self-pay

## 2023-09-09 DIAGNOSIS — M48062 Spinal stenosis, lumbar region with neurogenic claudication: Secondary | ICD-10-CM | POA: Diagnosis not present

## 2023-09-09 DIAGNOSIS — M5416 Radiculopathy, lumbar region: Secondary | ICD-10-CM | POA: Diagnosis not present

## 2023-09-09 DIAGNOSIS — M5136 Other intervertebral disc degeneration, lumbar region with discogenic back pain only: Secondary | ICD-10-CM | POA: Diagnosis not present

## 2023-09-16 ENCOUNTER — Other Ambulatory Visit: Payer: Self-pay | Admitting: Family Medicine

## 2023-09-16 DIAGNOSIS — E1169 Type 2 diabetes mellitus with other specified complication: Secondary | ICD-10-CM

## 2023-09-25 ENCOUNTER — Other Ambulatory Visit (HOSPITAL_BASED_OUTPATIENT_CLINIC_OR_DEPARTMENT_OTHER): Payer: Self-pay

## 2023-09-25 ENCOUNTER — Encounter: Payer: Self-pay | Admitting: Family Medicine

## 2023-10-10 DIAGNOSIS — I8310 Varicose veins of unspecified lower extremity with inflammation: Secondary | ICD-10-CM | POA: Diagnosis not present

## 2023-10-11 ENCOUNTER — Ambulatory Visit (INDEPENDENT_AMBULATORY_CARE_PROVIDER_SITE_OTHER): Payer: Medicare Other | Admitting: Family Medicine

## 2023-10-11 ENCOUNTER — Other Ambulatory Visit (HOSPITAL_BASED_OUTPATIENT_CLINIC_OR_DEPARTMENT_OTHER): Payer: Self-pay

## 2023-10-11 ENCOUNTER — Encounter: Payer: Self-pay | Admitting: Family Medicine

## 2023-10-11 VITALS — BP 134/96 | HR 78 | Resp 18 | Ht 61.0 in | Wt 218.0 lb

## 2023-10-11 DIAGNOSIS — B353 Tinea pedis: Secondary | ICD-10-CM | POA: Diagnosis not present

## 2023-10-11 DIAGNOSIS — Z7985 Long-term (current) use of injectable non-insulin antidiabetic drugs: Secondary | ICD-10-CM

## 2023-10-11 DIAGNOSIS — Z0000 Encounter for general adult medical examination without abnormal findings: Secondary | ICD-10-CM | POA: Diagnosis not present

## 2023-10-11 DIAGNOSIS — I10 Essential (primary) hypertension: Secondary | ICD-10-CM | POA: Diagnosis not present

## 2023-10-11 DIAGNOSIS — E1140 Type 2 diabetes mellitus with diabetic neuropathy, unspecified: Secondary | ICD-10-CM | POA: Diagnosis not present

## 2023-10-11 LAB — COMPREHENSIVE METABOLIC PANEL
ALT: 15 U/L (ref 0–35)
AST: 13 U/L (ref 0–37)
Albumin: 4.2 g/dL (ref 3.5–5.2)
Alkaline Phosphatase: 103 U/L (ref 39–117)
BUN: 10 mg/dL (ref 6–23)
CO2: 31 meq/L (ref 19–32)
Calcium: 9.6 mg/dL (ref 8.4–10.5)
Chloride: 104 meq/L (ref 96–112)
Creatinine, Ser: 0.74 mg/dL (ref 0.40–1.20)
GFR: 81.18 mL/min (ref >60.00)
Glucose, Bld: 96 mg/dL (ref 70–99)
Potassium: 4.3 meq/L (ref 3.5–5.1)
Sodium: 142 meq/L (ref 135–145)
Total Bilirubin: 0.5 mg/dL (ref 0.2–1.2)
Total Protein: 7.2 g/dL (ref 6.0–8.3)

## 2023-10-11 LAB — LIPID PANEL
Cholesterol: 172 mg/dL (ref 0–200)
HDL: 64.3 mg/dL (ref >39.00)
LDL Cholesterol: 90 mg/dL (ref 0–99)
NonHDL: 107.46
Total CHOL/HDL Ratio: 3
Triglycerides: 86 mg/dL (ref 0.0–149.0)
VLDL: 17.2 mg/dL (ref 0.0–40.0)

## 2023-10-11 LAB — CBC
HCT: 42.1 % (ref 36.0–46.0)
Hemoglobin: 13.1 g/dL (ref 12.0–15.0)
MCHC: 31.1 g/dL (ref 30.0–36.0)
MCV: 69.6 fl — ABNORMAL LOW (ref 78.0–100.0)
Platelets: 199 10*3/uL (ref 150.0–400.0)
RBC: 6.05 Mil/uL — ABNORMAL HIGH (ref 3.87–5.11)
RDW: 15.6 % — ABNORMAL HIGH (ref 11.5–15.5)
WBC: 5.5 10*3/uL (ref 4.0–10.5)

## 2023-10-11 LAB — HEMOGLOBIN A1C: Hgb A1c MFr Bld: 6.2 % (ref 4.6–6.5)

## 2023-10-11 MED ORDER — KETOCONAZOLE 2 % EX CREA
1.0000 | TOPICAL_CREAM | Freq: Every day | CUTANEOUS | 0 refills | Status: AC
Start: 2023-10-11 — End: 2023-11-22
  Filled 2023-10-11: qty 30, 30d supply, fill #0

## 2023-10-11 NOTE — Progress Notes (Signed)
 Chief Complaint  Patient presents with   Annual Exam     Well Woman Katelyn Fisher is here for a complete physical.   Her last physical was >1 year ago.  Current diet: in general, a "good" diet. Current exercise: walking. Weight is stable and she denies daytime fatigue. Seatbelt? Yes Advanced directive? Yes  Health Maintenance Colonoscopy- Yes Shingrix- Yes DEXA- Yes Mammogram- Yes Tetanus- Yes Pneumonia- Yes Hep C screen- Yes  Past Medical History:  Diagnosis Date   Allergy    SEASONAL   Anxiety    Arthritis    back   Asthma    Depression    Diabetes mellitus without complication (HCC)    Endometrial cancer (HCC) 2015   Family history of breast cancer    Family history of colon cancer    Family history of ovarian cancer    GERD (gastroesophageal reflux disease)    History of colonic diverticulitis    Hx of colonic polyps    Hyperlipidemia    Hypertension    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   Osteopenia 09/2017   T score -1.2 FRAX 3.2% / 0.3%   Personal history of chemotherapy 2015   Radiation 12/17/14-01/03/15   vaginal brachytherapy   Thyroid disease 06/01/2013     Past Surgical History:  Procedure Laterality Date   ABDOMINAL HYSTERECTOMY  06/23/2014   [facility], TRH/BSO   CHOLECYSTECTOMY  1991   COLONOSCOPY     DILATION AND CURETTAGE OF UTERUS     IR REMOVAL TUN ACCESS W/ PORT W/O FL MOD SED  04/30/2017   POLYPECTOMY     TUBAL LIGATION      Medications  Current Outpatient Medications on File Prior to Visit  Medication Sig Dispense Refill   albuterol (PROAIR HFA) 108 (90 Base) MCG/ACT inhaler Inhale 2 puffs by mouth into the lungs every 4 (four) hours as needed for coughing or wheezing spells. 6.7 g 1   albuterol (PROVENTIL) (2.5 MG/3ML) 0.083% nebulizer solution Take 3 mLs (2.5 mg total) by nebulization every 4 (four) hours as needed for wheezing or shortness of breath. 150 mL 2   amLODipine (NORVASC) 10 MG tablet TAKE 1 TABLET BY MOUTH DAILY 100  tablet 2   atorvastatin (LIPITOR) 40 MG tablet TAKE 1 TABLET BY MOUTH DAILY 100 tablet 2   benazepril (LOTENSIN) 20 MG tablet TAKE 1 TABLET BY MOUTH AT  BEDTIME 100 tablet 2   betamethasone dipropionate (DIPROLENE) 0.05 % ointment Apply 1 application externally once a day as needed for 30 days. 90 g 0   budesonide-formoterol (SYMBICORT) 160-4.5 MCG/ACT inhaler Inhale 2 puffs into the lungs as needed. 10.2 g 5   carvedilol (COREG) 12.5 MG tablet TAKE 1 TABLET BY MOUTH TWICE  DAILY WITH A MEAL 200 tablet 2   EPINEPHrine (EPIPEN 2-PAK) 0.3 mg/0.3 mL IJ SOAJ injection Use as directed for severe allergic reaction. 2 each 3   furosemide (LASIX) 20 MG tablet TAKE 1 TABLET BY MOUTH DAILY 100 tablet 2   levocetirizine (XYZAL) 5 MG tablet Take 1 tablet (5 mg total) by mouth every evening. 90 tablet 2   levothyroxine (SYNTHROID) 75 MCG tablet TAKE 1 TABLET (75 MCG TOTAL) BY MOUTH DAILY BEFORE BREAKFAST. Oral for 30     montelukast (SINGULAIR) 10 MG tablet Take 1 tablet (10 mg total) by mouth at bedtime. 30 tablet 5   Multiple Vitamin (MULTIVITAMIN) tablet Take 1 tablet by mouth daily.     Semaglutide (RYBELSUS) 7 MG TABS Take 1 tablet (7  mg total) by mouth daily. 30 tablet 5   traMADol (ULTRAM) 50 MG tablet Take 1 tablet (50 mg total) by mouth 3 (three) times daily as needed. 60 tablet 0    Allergies Allergies  Allergen Reactions   Shellfish Allergy Hives, Shortness Of Breath and Swelling   Oxycodone Swelling    Facial swelling and tightness   Penicillins Swelling    Rash with welps   Iodinated Contrast Media Rash   Sulfa Antibiotics Rash    Review of Systems: Constitutional:  no fevers Eye:  no recent significant change in vision Ears:  No changes in hearing Nose/Mouth/Throat:  no complaints of nasal congestion, no sore throat Cardiovascular: no chest pain Respiratory:  No shortness of breath Gastrointestinal:  No change in bowel habits GU:  Female: negative for dysuria Integumentary:  no  abnormal skin lesions reported Neurologic:  no headaches Endocrine:  denies unexplained weight changes  Exam BP (!) 134/96 (BP Location: Left Arm, Cuff Size: Large)   Pulse 78   Resp 18   Ht 5\' 1"  (1.549 m)   Wt 218 lb (98.9 kg)   SpO2 98%   BMI 41.19 kg/m  General:  well developed, well nourished, in no apparent distress Skin: macerated tissue between R 4/5th digit on foot; otherwise no significant moles, warts, or growths on exposed skin Head:  no masses, lesions, or tenderness Eyes:  pupils equal and round, sclera anicteric without injection Ears:  canals without lesions, TMs shiny without retraction, no obvious effusion, no erythema Nose:  nares patent, mucosa normal, and no drainage Throat/Pharynx:  lips and gingiva without lesion; tongue and uvula midline; non-inflamed pharynx; no exudates or postnasal drainage Neck: neck supple without adenopathy, thyromegaly, or masses Lungs:  clear to auscultation, breath sounds equal bilaterally, no respiratory distress Cardio:  regular rate and rhythm, no bruits or LE edema Abdomen:  abdomen soft, nontender; bowel sounds normal; no masses or organomegaly Genital: Deferred Neuro:  gait normal; deep tendon reflexes normal and symmetric Psych: well oriented with normal range of affect and appropriate judgment/insight  Assessment and Plan  Well adult exam  Type 2 diabetes mellitus with diabetic neuropathy, without long-term current use of insulin (HCC) - Plan: CBC, Comprehensive metabolic panel, Hemoglobin A1c, Lipid panel, Microalbumin / creatinine urine ratio  Essential hypertension  Tinea pedis of right foot - Plan: ketoconazole (NIZORAL) 2 % cream   Well 73 y.o. female. Counseled on diet and exercise. Monitor BP at home. BP goals written down. Send message if >140/90.  6 weeks of ketoconazole daily.  Other orders as above. Follow up in 6 mo. The patient voiced understanding and agreement to the plan.  Jilda Roche [place],  DO 10/11/23 7:25 AM

## 2023-10-11 NOTE — Patient Instructions (Addendum)
 Give [place] 2-3 business days to get the results of your labs back.   Keep the diet clean and stay active.  Check your blood pressures 2-3 times per week, alternating the time of day you check it. If it is high, considering waiting 1-2 minutes and rechecking. If it gets higher, your anxiety is likely creeping up and we should avoid rechecking.   I want your blood pressure less than 140 on the top and less than 90 on the bottom consistently. Both goals must be met (ie, 150/70 is too high even though the 70 on the bottom is desirable). Come back in a month if we are not at goal.   Let us know if you need anything.

## 2023-10-14 ENCOUNTER — Encounter: Payer: Self-pay | Admitting: Family Medicine

## 2023-10-14 LAB — MICROALBUMIN / CREATININE URINE RATIO
Creatinine,U: 23.3 mg/dL
Microalb Creat Ratio: UNDETERMINED mg/g (ref 0.0–30.0)
Microalb, Ur: -0.1 mg/dL — ABNORMAL LOW (ref 0.0–1.9)

## 2023-10-17 ENCOUNTER — Other Ambulatory Visit (HOSPITAL_BASED_OUTPATIENT_CLINIC_OR_DEPARTMENT_OTHER): Payer: Self-pay | Admitting: Family Medicine

## 2023-10-17 DIAGNOSIS — Z139 Encounter for screening, unspecified: Secondary | ICD-10-CM

## 2023-11-01 ENCOUNTER — Ambulatory Visit: Payer: Medicare Other | Admitting: Obstetrics and Gynecology

## 2023-11-01 ENCOUNTER — Other Ambulatory Visit (HOSPITAL_COMMUNITY)
Admission: RE | Admit: 2023-11-01 | Discharge: 2023-11-01 | Disposition: A | Discharge status: Home / Self Care | Source: Ambulatory Visit | Attending: Obstetrics and Gynecology | Admitting: Obstetrics and Gynecology

## 2023-11-01 ENCOUNTER — Encounter: Payer: Self-pay | Admitting: Obstetrics and Gynecology

## 2023-11-01 VITALS — BP 114/70 | HR 70 | Resp 16 | Ht 61.25 in | Wt 214.0 lb

## 2023-11-01 DIAGNOSIS — Z9189 Other specified personal risk factors, not elsewhere classified: Secondary | ICD-10-CM | POA: Insufficient documentation

## 2023-11-01 DIAGNOSIS — E2839 Other primary ovarian failure: Secondary | ICD-10-CM

## 2023-11-01 DIAGNOSIS — Z01419 Encounter for gynecological examination (general) (routine) without abnormal findings: Secondary | ICD-10-CM

## 2023-11-01 DIAGNOSIS — R87610 Atypical squamous cells of undetermined significance on cytologic smear of cervix (ASC-US): Secondary | ICD-10-CM

## 2023-11-01 DIAGNOSIS — Z1272 Encounter for screening for malignant neoplasm of vagina: Secondary | ICD-10-CM

## 2023-11-01 DIAGNOSIS — M8580 Other specified disorders of bone density and structure, unspecified site: Secondary | ICD-10-CM

## 2023-11-01 NOTE — Addendum Note (Signed)
 Addended by: Earley Favor on: 11/01/2023 10:24 AM   Modules accepted: Orders

## 2023-11-01 NOTE — Progress Notes (Signed)
 73 y.o. y.o. female here for medicare gyn exam No LMP recorded. Patient has had a hysterectomy.     G2P2L3 1 adopted child.  Married.   RP:  Established patient presenting for annual gyn exam    HPI: Postmenopause, well on no HRT.  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 Had a total hysterectomy with bilateral salpingo-oophorectomy and staging and postop chemotherapy and radiation treatment which finished in 2016.  Released by Gynecologic oncology. Pap 09/2020 second ASCUS/HPV HR, Colpo 10/2020 No dysplasia on Vaginal Bx.  Pap Neg 09/2021.  Pap reflex today. No pelvic pain. Urinary frequency, good amounts and no UTI Sxs otherwise. Breasts normal.  Last mammogram negative 11/2022 scheduled for June at the [facility].  Health labs with her family physician. BMI 42.73. Had Back surgery in 2022. BMD Osteopenia -1.5 in 10/2021. Repeat dxa ordered.  COLONOSCOPY: 11/2021. There is no height or weight on file to calculate BMI.     07/01/2023    2:06 PM 05/09/2023    9:04 AM 10/08/2022    7:04 AM  Depression screen PHQ 2/9  Decreased Interest 0 0 0  Down, Depressed, Hopeless 0 0 0  PHQ - 2 Score 0 0 0  Altered sleeping   0  Tired, decreased energy   0  Change in appetite   0  Feeling bad or failure about yourself    0  Trouble concentrating   0  Moving slowly or fidgety/restless   0  Suicidal thoughts   0  PHQ-9 Score   0  Difficult doing work/chores   Not difficult at all    There were no vitals taken for this visit.     Component Value Date/Time   DIAGPAP  10/31/2022 1002    - Negative for intraepithelial lesion or malignancy (NILM)   DIAGPAP  09/28/2021 1013    - Negative for intraepithelial lesion or malignancy (NILM)   ADEQPAP Satisfactory for evaluation. 10/31/2022 1002   ADEQPAP Satisfactory for evaluation. 09/28/2021 1013    GYN HISTORY:    Component Value Date/Time   DIAGPAP  10/31/2022 1002    - Negative for intraepithelial lesion or  malignancy (NILM)   DIAGPAP  09/28/2021 1013    - Negative for intraepithelial lesion or malignancy (NILM)   ADEQPAP Satisfactory for evaluation. 10/31/2022 1002   ADEQPAP Satisfactory for evaluation. 09/28/2021 1013    OB History  Gravida Para Term Preterm AB Living  2 2 2   2   SAB IAB Ectopic Multiple Live Births          # Outcome Date GA Lbr Len/2nd Weight Sex Type Anes PTL Lv  2 Term           1 Term             Obstetric Comments  1 adopted child    Past Medical History:  Diagnosis Date   Allergy    SEASONAL   Anxiety    Arthritis    back   Asthma    Depression    Diabetes mellitus without complication (HCC)    Endometrial cancer (HCC) 2015   Family history of breast cancer    Family history of colon cancer    Family history of ovarian cancer    GERD (gastroesophageal reflux disease)    History of colonic diverticulitis    Hx of colonic polyps    Hyperlipidemia    Hypertension    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   Osteopenia 09/2017  T score -1.2 FRAX 3.2% / 0.3%   Personal history of chemotherapy 2015   Radiation 12/17/14-01/03/15   vaginal brachytherapy   Thyroid disease 06/01/2013    Past Surgical History:  Procedure Laterality Date   ABDOMINAL HYSTERECTOMY  06/23/2014   [facility], TRH/BSO   CHOLECYSTECTOMY  1991   COLONOSCOPY     DILATION AND CURETTAGE OF UTERUS     IR REMOVAL TUN ACCESS W/ PORT W/O FL MOD SED  04/30/2017   POLYPECTOMY     TUBAL LIGATION      Current Outpatient Medications on File Prior to Visit  Medication Sig Dispense Refill   albuterol (PROAIR HFA) 108 (90 Base) MCG/ACT inhaler Inhale 2 puffs by mouth into the lungs every 4 (four) hours as needed for coughing or wheezing spells. 6.7 g 1   albuterol (PROVENTIL) (2.5 MG/3ML) 0.083% nebulizer solution Take 3 mLs (2.5 mg total) by nebulization every 4 (four) hours as needed for wheezing or shortness of breath. 150 mL 2   amLODipine (NORVASC) 10 MG tablet TAKE 1 TABLET BY MOUTH  DAILY 100 tablet 2   atorvastatin (LIPITOR) 40 MG tablet TAKE 1 TABLET BY MOUTH DAILY 100 tablet 2   benazepril (LOTENSIN) 20 MG tablet TAKE 1 TABLET BY MOUTH AT  BEDTIME 100 tablet 2   betamethasone dipropionate (DIPROLENE) 0.05 % ointment Apply 1 application externally once a day as needed for 30 days. 90 g 0   betamethasone dipropionate (DIPROLENE) 0.05 % ointment Apply 1 Application externally daily as needed. 90 g 0   betamethasone dipropionate (DIPROLENE) 0.05 % ointment Apply 1 Application topically daily. 90 g 0   Betamethasone Dipropionate POWD 1 application Externally Once a day prn 30 days 100 g 0   budesonide-formoterol (SYMBICORT) 160-4.5 MCG/ACT inhaler Inhale 2 puffs into the lungs as needed. 10.2 g 5   carvedilol (COREG) 12.5 MG tablet TAKE 1 TABLET BY MOUTH TWICE  DAILY WITH A MEAL 200 tablet 2   EPINEPHrine (EPIPEN 2-PAK) 0.3 mg/0.3 mL IJ SOAJ injection Use as directed for severe allergic reaction. 2 each 3   furosemide (LASIX) 20 MG tablet TAKE 1 TABLET BY MOUTH DAILY 100 tablet 2   ketoconazole (NIZORAL) 2 % cream Apply 1 Application topically daily. 30 g 0   levocetirizine (XYZAL) 5 MG tablet Take 1 tablet (5 mg total) by mouth every evening. 90 tablet 2   levothyroxine (SYNTHROID) 75 MCG tablet TAKE 1 TABLET (75 MCG TOTAL) BY MOUTH DAILY BEFORE BREAKFAST. Oral for 30     montelukast (SINGULAIR) 10 MG tablet Take 1 tablet (10 mg total) by mouth at bedtime. 30 tablet 5   Multiple Vitamin (MULTIVITAMIN) tablet Take 1 tablet by mouth daily.     Semaglutide (RYBELSUS) 7 MG TABS Take 1 tablet (7 mg total) by mouth daily. 30 tablet 5   traMADol (ULTRAM) 50 MG tablet Take 1 tablet (50 mg total) by mouth 3 (three) times daily as needed. 60 tablet 0   traMADol (ULTRAM) 50 MG tablet Take 1 tablet (50 mg total) by mouth 3 (three) times daily as needed. 60 tablet 0   [DISCONTINUED] fluticasone (FLOVENT HFA) 110 MCG/ACT inhaler Inhale 2 puffs into the lungs in the morning and at bedtime  for 14 days. 1 each 0   No current facility-administered medications on file prior to visit.    Social History   Socioeconomic History   Marital status: Married    Spouse name: Not on file   Number of children: 3  Years of education: Not on file   Highest education level: Some college, no degree  Occupational History   Occupation: retired Diplomatic Services operational officer  Tobacco Use   Smoking status: Never    Passive exposure: Never   Smokeless tobacco: Never  Vaping Use   Vaping status: Never Used  Substance and Sexual Activity   Alcohol use: No    Alcohol/week: 0.0 standard drinks of alcohol   Drug use: No   Sexual activity: Yes    Partners: Male    Birth control/protection: Surgical    Comment: 1st intercourse- 15, partners- 3, hysterectomy  Other Topics Concern   Not on file  Social History Narrative   Married- 40 years   Never Smoked   Alcohol use-no   Drug use-no   Occupation: housewife   Caffeine use/day:  None   Does Patient Exercise:  yes   Social Drivers of Corporate investment banker Strain: Low Risk  (07/26/2023)   Overall Financial Resource Strain (CARDIA)    Difficulty of Paying Living Expenses: Not very hard  Food Insecurity: No Food Insecurity (07/26/2023)   Hunger Vital Sign    Worried About Running Out of Food in the Last Year: Never true    Ran Out of Food in the Last Year: Never true  Transportation Needs: No Transportation Needs (07/26/2023)   PRAPARE - Administrator, Civil Service (Medical): No    Lack of Transportation (Non-Medical): No  Physical Activity: Insufficiently Active (07/26/2023)   Exercise Vital Sign    Days of Exercise per Week: 3 days    Minutes of Exercise per Session: 30 min  Stress: No Stress Concern Present (07/26/2023)   [employer] of Occupational Health - Occupational Stress Questionnaire    Feeling of Stress : Not at all  Social Connections: Socially Integrated (07/26/2023)   Social Connection and Isolation Panel [NHANES]     Frequency of Communication with Friends and Family: More than three times a week    Frequency of Social Gatherings with Friends and Family: More than three times a week    Attends Religious Services: More than 4 times per year    Active Member of [employer] or Organizations: Yes    Attends Engineer, structural: More than 4 times per year    Marital Status: Married  Catering manager Violence: Not At Risk (05/09/2023)   Humiliation, Afraid, Rape, and Kick questionnaire    Fear of Current or Ex-Partner: No    Emotionally Abused: No    Physically Abused: No    Sexually Abused: No    Family History  Problem Relation Age of Onset   Diabetes Mother    Lupus Mother    Prostate cancer Father 6   Diabetes Father    Cancer Father        lung cancer, asbestos exposure   Hyperlipidemia Sister    Heart disease Brother    Diabetes Brother    Heart disease Brother    Heart disease Brother    Diabetes Brother    Colon polyps Brother    Heart disease Brother    Diabetes Brother    Colon polyps Brother    Heart disease Maternal Aunt        x 3 -2 brothers   Hypertension Maternal Aunt    Breast cancer Maternal Aunt        maternal half; dx in her 34s   Cancer Maternal Aunt        cervical  Ovarian cancer Maternal Aunt        dx in her 40s   Leukemia Maternal Uncle 21   Cancer Paternal Aunt        NOS- breast    Prostate cancer Paternal Uncle        dx in his 69s   Colon cancer Maternal Grandmother        dx in her 86s   Breast cancer Other        dx in her 76s     Allergies  Allergen Reactions   Shellfish Allergy Hives, Shortness Of Breath and Swelling   Oxycodone Swelling    Facial swelling and tightness   Penicillins Swelling    Rash with welps   Iodinated Contrast Media Rash   Sulfa Antibiotics Rash      Patient's last menstrual period was No LMP recorded. Patient has had a hysterectomy..             Review of Systems Alls systems reviewed and are  negative.     Physical Exam Constitutional:      Appearance: Normal appearance.  Genitourinary:     Vulva and urethral meatus normal.     No lesions in the vagina.     Right Labia: No rash, lesions or skin changes.    Left Labia: No lesions, skin changes or rash.    No vaginal discharge or tenderness.     No vaginal prolapse present.    No vaginal atrophy present.     Right Adnexa: absent.    Left Adnexa: absent.    Cervix is absent.     Uterus is absent.  Breasts:    Right: Normal.     Left: Normal.  HENT:     Head: Normocephalic.  Neck:     Thyroid: No thyroid mass, thyromegaly or thyroid tenderness.  Cardiovascular:     Rate and Rhythm: Normal rate and regular rhythm.     Heart sounds: Normal heart sounds, S1 normal and S2 normal.  Pulmonary:     Effort: Pulmonary effort is normal.     Breath sounds: Normal breath sounds and air entry.  Abdominal:     General: There is no distension.     Palpations: Abdomen is soft. There is no mass.     Tenderness: There is no abdominal tenderness. There is no guarding or rebound.  Musculoskeletal:        General: Normal range of motion.     Cervical back: Full passive range of motion without pain, normal range of motion and neck supple. No tenderness.     Right lower leg: No edema.     Left lower leg: No edema.  Neurological:     Mental Status: She is alert.  Skin:    General: Skin is warm.  Psychiatric:        Mood and Affect: Mood normal.        Behavior: Behavior normal.        Thought Content: Thought content normal.  Vitals and nursing note reviewed. Exam conducted with a chaperone present.       A:        Medicare GYN exam, history of endometrial cancer                             P:        Pap smear collected today Encouraged annual mammogram screening Colon cancer screening up-to-date DXA  ordered today Labs and immunizations to do with PMD Encouraged healthy lifestyle practices Encouraged Vit D and Calcium    No follow-ups on file.  Earley Favor

## 2023-11-03 ENCOUNTER — Encounter (HOSPITAL_COMMUNITY): Payer: Self-pay

## 2023-11-03 ENCOUNTER — Other Ambulatory Visit: Payer: Self-pay | Admitting: Family Medicine

## 2023-11-03 ENCOUNTER — Other Ambulatory Visit: Payer: Self-pay

## 2023-11-03 ENCOUNTER — Emergency Department (HOSPITAL_COMMUNITY)

## 2023-11-03 ENCOUNTER — Emergency Department (HOSPITAL_COMMUNITY)
Admission: EM | Admit: 2023-11-03 | Discharge: 2023-11-03 | Disposition: A | Discharge status: Home / Self Care | Attending: Emergency Medicine | Admitting: Emergency Medicine

## 2023-11-03 DIAGNOSIS — Z79899 Other long term (current) drug therapy: Secondary | ICD-10-CM | POA: Insufficient documentation

## 2023-11-03 DIAGNOSIS — R55 Syncope and collapse: Secondary | ICD-10-CM

## 2023-11-03 DIAGNOSIS — J45909 Unspecified asthma, uncomplicated: Secondary | ICD-10-CM | POA: Diagnosis not present

## 2023-11-03 DIAGNOSIS — E1169 Type 2 diabetes mellitus with other specified complication: Secondary | ICD-10-CM

## 2023-11-03 DIAGNOSIS — R42 Dizziness and giddiness: Secondary | ICD-10-CM | POA: Insufficient documentation

## 2023-11-03 DIAGNOSIS — I10 Essential (primary) hypertension: Secondary | ICD-10-CM | POA: Diagnosis not present

## 2023-11-03 DIAGNOSIS — R112 Nausea with vomiting, unspecified: Secondary | ICD-10-CM | POA: Diagnosis not present

## 2023-11-03 DIAGNOSIS — Z743 Need for continuous supervision: Secondary | ICD-10-CM | POA: Diagnosis not present

## 2023-11-03 DIAGNOSIS — R531 Weakness: Secondary | ICD-10-CM | POA: Diagnosis not present

## 2023-11-03 DIAGNOSIS — R5383 Other fatigue: Secondary | ICD-10-CM | POA: Diagnosis not present

## 2023-11-03 DIAGNOSIS — R231 Pallor: Secondary | ICD-10-CM | POA: Diagnosis not present

## 2023-11-03 DIAGNOSIS — R404 Transient alteration of awareness: Secondary | ICD-10-CM | POA: Diagnosis not present

## 2023-11-03 DIAGNOSIS — I6782 Cerebral ischemia: Secondary | ICD-10-CM | POA: Diagnosis not present

## 2023-11-03 LAB — CBC
HCT: 40.6 % (ref 36.0–46.0)
Hemoglobin: 12.1 g/dL (ref 12.0–15.0)
MCH: 21.3 pg — ABNORMAL LOW (ref 26.0–34.0)
MCHC: 29.8 g/dL — ABNORMAL LOW (ref 30.0–36.0)
MCV: 71.6 fL — ABNORMAL LOW (ref 80.0–100.0)
Platelets: 176 10*3/uL (ref 150–400)
RBC: 5.67 MIL/uL — ABNORMAL HIGH (ref 3.87–5.11)
RDW: 15.2 % (ref 11.5–15.5)
WBC: 6.5 10*3/uL (ref 4.0–10.5)
nRBC: 0 % (ref 0.0–0.2)

## 2023-11-03 LAB — COMPREHENSIVE METABOLIC PANEL WITH GFR
ALT: 20 U/L (ref 0–44)
AST: 18 U/L (ref 15–41)
Albumin: 3.4 g/dL — ABNORMAL LOW (ref 3.5–5.0)
Alkaline Phosphatase: 90 U/L (ref 38–126)
Anion gap: 8 (ref 5–15)
BUN: 13 mg/dL (ref 8–23)
CO2: 26 mmol/L (ref 22–32)
Calcium: 8.7 mg/dL — ABNORMAL LOW (ref 8.9–10.3)
Chloride: 108 mmol/L (ref 98–111)
Creatinine, Ser: 0.88 mg/dL (ref 0.44–1.00)
GFR, Estimated: >60 mL/min (ref >60)
Glucose, Bld: 115 mg/dL — ABNORMAL HIGH (ref 70–99)
Potassium: 3.2 mmol/L — ABNORMAL LOW (ref 3.5–5.1)
Sodium: 142 mmol/L (ref 135–145)
Total Bilirubin: 0.4 mg/dL (ref 0.0–1.2)
Total Protein: 6.6 g/dL (ref 6.5–8.1)

## 2023-11-03 LAB — CBG MONITORING, ED: Glucose-Capillary: 129 mg/dL — ABNORMAL HIGH (ref 70–99)

## 2023-11-03 LAB — TROPONIN I (HIGH SENSITIVITY): Troponin I (High Sensitivity): 4 ng/L (ref <18)

## 2023-11-03 LAB — LIPASE, BLOOD: Lipase: 24 U/L (ref 11–51)

## 2023-11-03 MED ORDER — POTASSIUM CHLORIDE CRYS ER 20 MEQ PO TBCR
40.0000 meq | EXTENDED_RELEASE_TABLET | Freq: Once | ORAL | Status: AC
  Administered 2023-11-03: 40 meq via ORAL
  Filled 2023-11-03: qty 2

## 2023-11-03 MED ORDER — SODIUM CHLORIDE 0.9 % IV BOLUS
1000.0000 mL | Freq: Once | INTRAVENOUS | Status: AC
Start: 2023-11-03 — End: 2023-11-03
  Administered 2023-11-03: 1000 mL via INTRAVENOUS

## 2023-11-03 NOTE — ED Notes (Signed)
 Patient transported to CT

## 2023-11-03 NOTE — Discharge Instructions (Signed)
 Please drink plenty of fluids, follow-up closely with your primary care doctor, return to the emergency department if you have another episode of loss of consciousness or severe lightheadedness.

## 2023-11-03 NOTE — ED Notes (Signed)
 Pt. Putting clothes on after being discharged and noticed redness to bilateral thighs. MD made aware.

## 2023-11-03 NOTE — ED Provider Notes (Signed)
 [place] EMERGENCY DEPARTMENT AT [facility] Provider Note   CSN: [id number] Arrival date & time: 11/03/23  1619     History  Chief Complaint  Patient presents with   Loss of Consciousness    Katelyn Fisher is a 73 y.o. female past medical history significant for asthma, GERD, hypertension, depression, anxiety who presents after dizziness, syncopal episode, nausea, vomiting, abdominal pain just prior to arrival.  She was on the porch when she would not talk to someone, she remembers feeling dizzy, hot, she started seeing stars.  She denies any pain or heart racing sensation.  She reports that she was able to make it to the chair.  She did have an episode of vomiting and was leaning forward per EMS.  Had a similar episode around 2 years ago.  She presents with tight clothing, girdle, constrictive pants.  She reports feeling somewhat better at this time.   Loss of Consciousness      Home Medications Prior to Admission medications   Medication Sig Start Date End Date Taking? Authorizing Provider  albuterol (PROAIR HFA) 108 (90 Base) MCG/ACT inhaler Inhale 2 puffs by mouth into the lungs every 4 (four) hours as needed for coughing or wheezing spells. 07/30/23   Ardie Kras, FNP  albuterol (PROVENTIL) (2.5 MG/3ML) 0.083% nebulizer solution Take 3 mLs (2.5 mg total) by nebulization every 4 (four) hours as needed for wheezing or shortness of breath. 08/21/21   Ardie Kras, FNP  amLODipine (NORVASC) 10 MG tablet TAKE 1 TABLET BY MOUTH DAILY 09/17/23   Jobe Mulder, DO  atorvastatin (LIPITOR) 40 MG tablet TAKE 1 TABLET BY MOUTH DAILY 09/17/23   Jobe Mulder, DO  benazepril (LOTENSIN) 20 MG tablet TAKE 1 TABLET BY MOUTH AT  BEDTIME 09/17/23   Wendling, Shellie Dials, DO  betamethasone dipropionate (DIPROLENE) 0.05 % ointment Apply 1 Application topically daily. 07/09/23     Betamethasone Dipropionate POWD 1 application Externally Once a day prn 30 days 04/11/23      budesonide-formoterol (SYMBICORT) 160-4.5 MCG/ACT inhaler Inhale 2 puffs into the lungs as needed. 11/28/22   Ardie Kras, FNP  carvedilol (COREG) 12.5 MG tablet TAKE 1 TABLET BY MOUTH TWICE  DAILY WITH A MEAL 09/17/23   Wendling, Shellie Dials, DO  EPINEPHrine (EPIPEN 2-PAK) 0.3 mg/0.3 mL IJ SOAJ injection Use as directed for severe allergic reaction. Patient not taking: Reported on 11/01/2023 07/30/23   Ardie Kras, FNP  furosemide (LASIX) 20 MG tablet TAKE 1 TABLET BY MOUTH DAILY 09/17/23   Jobe Mulder, DO  ketoconazole (NIZORAL) 2 % cream Apply 1 Application topically daily. 10/11/23 11/22/23  Jobe Mulder, DO  levocetirizine (XYZAL) 5 MG tablet Take 1 tablet (5 mg total) by mouth every evening. 01/29/23   Ardie Kras, FNP  levothyroxine (SYNTHROID) 75 MCG tablet TAKE 1 TABLET (75 MCG TOTAL) BY MOUTH DAILY BEFORE BREAKFAST. Oral for 30    [provider]  montelukast (SINGULAIR) 10 MG tablet Take 1 tablet (10 mg total) by mouth at bedtime. 10/29/22   Ardie Kras, FNP  Multiple Vitamin (MULTIVITAMIN) tablet Take 1 tablet by mouth daily.    [provider]  Semaglutide (RYBELSUS) 7 MG TABS Take 1 tablet (7 mg total) by mouth daily. 05/10/23   Jobe Mulder, DO  traMADol (ULTRAM) 50 MG tablet Take 1 tablet (50 mg total) by mouth 3 (three) times daily as needed. 05/06/23     fluticasone (FLOVENT HFA) 110  MCG/ACT inhaler Inhale 2 puffs into the lungs in the morning and at bedtime for 14 days. 07/25/20 10/10/20  Jobe Mulder, DO      Allergies    Shellfish allergy, Oxycodone, Penicillins, Iodinated contrast media, and Sulfa antibiotics    Review of Systems   Review of Systems  Cardiovascular:  Positive for syncope.  All other systems reviewed and are negative.   Physical Exam Updated Vital Signs BP 106/68 (BP Location: Right Arm)   Pulse 79   Temp 97.9 F (36.6 C)   Resp 16   Ht 5' 1.25" (1.556 m)   Wt 97.1 kg   SpO2 97%   BMI 40.11  kg/m  Physical Exam Vitals and nursing note reviewed.  Constitutional:      General: She is not in acute distress.    Appearance: Normal appearance.  HENT:     Head: Normocephalic and atraumatic.  Eyes:     General:        Right eye: No discharge.        Left eye: No discharge.  Cardiovascular:     Rate and Rhythm: Normal rate and regular rhythm.     Heart sounds: No murmur heard.    No friction rub. No gallop.  Pulmonary:     Effort: Pulmonary effort is normal.     Breath sounds: Normal breath sounds.  Abdominal:     General: Bowel sounds are normal.     Palpations: Abdomen is soft.  Skin:    General: Skin is warm and dry.     Capillary Refill: Capillary refill takes less than 2 seconds.  Neurological:     Mental Status: She is alert and oriented to person, place, and time.     Comments: Moves all 4 limbs spontaneously, CN II through XII grossly intact, can ambulate without difficulty, intact sensation throughout.  Psychiatric:        Mood and Affect: Mood normal.        Behavior: Behavior normal.     ED Results / Procedures / Treatments   Labs (all labs ordered are listed, but only abnormal results are displayed) Labs Reviewed  CBG MONITORING, ED - Abnormal; Notable for the following components:      Result Value   Glucose-Capillary 129 (*)    All other components within normal limits    EKG None  Radiology No results found.  Procedures Procedures    Medications Ordered in ED Medications - No data to display  ED Course/ Medical Decision Making/ A&P                                 Medical Decision Making  This patient is a 73 y.o. female  who presents to the ED for concern of syncope.   Differential diagnoses prior to evaluation: The emergent differential diagnosis includes, but is not limited to,  CVA, ACS, arrhythmia, vasovagal / orthostatic hypotension, sepsis, hypoglycemia, electrolyte disturbance, respiratory failure, anemia, dehydration, heat  injury, polypharmacy, malignancy, anxiety/panic attack.  . This is not an exhaustive differential.   Past Medical History / Co-morbidities / Social History: asthma, GERD, hypertension, depression, anxiety  Physical Exam: Physical exam performed. The pertinent findings include: No focal neurologic deficits, vital signs stable in the emergency department, she was wearing quite tight pants and a girdle on arrival but appears more comfortable after clothing removed and patient placed in gown.  Lab Tests/Imaging studies: I  personally interpreted labs/imaging and the pertinent results include: CBC overall unremarkable, initial troponin negative x 1, no chest pain or feeling of heart racing prior to syncope.  Normal lipase.  CMP notable for mild hypokalemia, Tessman 3.2, we will orally replete..  I independently interpreted CT head and plain film chest x-ray shows no evidence of intracranial or intrathoracic abnormality.  I agree with the radiologist interpretation.  Cardiac monitoring: EKG obtained and interpreted by myself and attending physician which shows: Normal sinus rhythm with no acute ST-T changes   Medications: I ordered medication including fluid bolus, potassium 4 possible mild dehydration, hypokalemia.  I have reviewed the patients home medicines and have made adjustments as needed.   Disposition: After consideration of the diagnostic results and the patients response to treatment, I feel that unclear etiology for syncope but patient appearing fine after work up, feeling improved, suspect maybe he got overheated, no signs of acute cardiac abnormality, low clinical suspicion for blood clot, stroke with no pleuritic chest pain, no leg swelling, no focal neurologic deficits.Aaron Aas   emergency department workup does not suggest an emergent condition requiring admission or immediate intervention beyond what has been performed at this time. The plan is: as above. The patient is safe for discharge and  has been instructed to return immediately for worsening symptoms, change in symptoms or any other concerns.  Final Clinical Impression(s) / ED Diagnoses Final diagnoses:  None    Rx / DC Orders ED Discharge Orders     None         Stefan Edge 11/03/23 1910    Hershel Los, MD 11/03/23 2317

## 2023-11-03 NOTE — ED Triage Notes (Addendum)
 Pt BIBA from home, c/o dizziness, syncopal episode, abdominal pain, and vomiting.  She only remembers being dizzy. Per EMS pt was leaning forward when vomiting so no concerns of aspiration. Negative on stroke and stemi screening.  Hx of hypertension and prediabetes.  Fluid bolus given en route, 20G LAC, 4mg  of zofran.  Had syncopal episode about a year or 2 ago.  Was sitting on the porch when EMS arrived and pt stated she felt like she "got too hot." Pt also had on very tight clothing when presented to the ED that included a girdle and pants. VSS  BP 104/70

## 2023-11-04 ENCOUNTER — Encounter: Payer: Self-pay | Admitting: Family Medicine

## 2023-11-04 ENCOUNTER — Other Ambulatory Visit (HOSPITAL_BASED_OUTPATIENT_CLINIC_OR_DEPARTMENT_OTHER): Payer: Self-pay

## 2023-11-04 MED ORDER — MONTELUKAST SODIUM 10 MG PO TABS
10.0000 mg | ORAL_TABLET | Freq: Every day | ORAL | 5 refills | Status: AC
Start: 2023-11-04 — End: ?
  Filled 2023-11-04: qty 30, 30d supply, fill #0
  Filled 2023-11-30: qty 30, 30d supply, fill #1
  Filled 2024-01-06: qty 30, 30d supply, fill #2
  Filled 2024-02-03: qty 30, 30d supply, fill #3

## 2023-11-04 MED ORDER — RYBELSUS 7 MG PO TABS
7.0000 mg | ORAL_TABLET | Freq: Every day | ORAL | 5 refills | Status: DC
  Filled 2023-11-04: qty 30, 30d supply, fill #0
  Filled 2023-11-30: qty 30, 30d supply, fill #1
  Filled 2024-01-06: qty 30, 30d supply, fill #2
  Filled 2024-02-03: qty 30, 30d supply, fill #3
  Filled 2024-03-03: qty 30, 30d supply, fill #4
  Filled 2024-04-06: qty 30, 30d supply, fill #5

## 2023-11-05 ENCOUNTER — Other Ambulatory Visit (HOSPITAL_BASED_OUTPATIENT_CLINIC_OR_DEPARTMENT_OTHER): Payer: Self-pay

## 2023-11-07 ENCOUNTER — Ambulatory Visit (INDEPENDENT_AMBULATORY_CARE_PROVIDER_SITE_OTHER): Admitting: Family Medicine

## 2023-11-07 ENCOUNTER — Encounter: Payer: Self-pay | Admitting: Family Medicine

## 2023-11-07 ENCOUNTER — Other Ambulatory Visit (HOSPITAL_BASED_OUTPATIENT_CLINIC_OR_DEPARTMENT_OTHER)

## 2023-11-07 VITALS — BP 130/80 | HR 82 | Ht 61.25 in | Wt 215.6 lb

## 2023-11-07 DIAGNOSIS — E876 Hypokalemia: Secondary | ICD-10-CM

## 2023-11-07 DIAGNOSIS — R55 Syncope and collapse: Secondary | ICD-10-CM | POA: Diagnosis not present

## 2023-11-07 LAB — BASIC METABOLIC PANEL WITH GFR
BUN: 10 mg/dL (ref 6–23)
CO2: 32 meq/L (ref 19–32)
Calcium: 9.3 mg/dL (ref 8.4–10.5)
Chloride: 104 meq/L (ref 96–112)
Creatinine, Ser: 0.85 mg/dL (ref 0.40–1.20)
GFR: 68.71 mL/min (ref >60.00)
Glucose, Bld: 108 mg/dL — ABNORMAL HIGH (ref 70–99)
Potassium: 4.6 meq/L (ref 3.5–5.1)
Sodium: 142 meq/L (ref 135–145)

## 2023-11-07 LAB — MAGNESIUM: Magnesium: 2 mg/dL (ref 1.5–2.5)

## 2023-11-07 NOTE — Progress Notes (Signed)
 Chief Complaint  Patient presents with   Follow-up    Patient presents today for emergency room follow-up    Subjective: Patient is a 73 y.o. female here for ER f/u.  The patient was in the emergency department 11/03/2023 for syncopal episode.  She was at church and came home to eat lunch.  She had a small 30 sandwich with water/tea and started seeing stars.  She sat down, felt lightheaded and sweaty.  She proceeded to throw up and then lost consciousness.  This happened when she turned 72 in 2024.  No episodes of lightheadedness or dizziness since then.  Her sugars are unremarkable.  Potassium was low in the emergency department.  Imaging was unremarkable.  No signs of a myocardial infarction.  She has been fine since then.  She stays well-hydrated overall.  She eats regular meals.  Her sugars have been stable.  She has been taking her medications routinely.  Past Medical History:  Diagnosis Date   Allergy    SEASONAL   Anxiety    Arthritis    back   Asthma    Depression    Diabetes mellitus without complication (HCC)    Endometrial cancer (HCC) 2015   Family history of breast cancer    Family history of colon cancer    Family history of ovarian cancer    GERD (gastroesophageal reflux disease)    History of colonic diverticulitis    Hx of colonic polyps    Hyperlipidemia    Hypertension    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   Osteopenia 09/2017   T score -1.2 FRAX 3.2% / 0.3%   Personal history of chemotherapy 2015   Radiation 12/17/14-01/03/15   vaginal brachytherapy   Thyroid disease 06/01/2013    Objective: BP 130/80   Pulse 82   Ht 5' 1.25" (1.556 m)   Wt 215 lb 9.6 oz (97.8 kg)   SpO2 96%   BMI 40.41 kg/m  General: Awake, appears stated age Heart: RRR, no LE edema, no bruits Lungs: CTAB, no rales, wheezes or rhonchi. No accessory muscle use Neuro: Gait is normal Psych: Age appropriate judgment and insight, normal affect and mood  Assessment and Plan: Syncope,  unspecified syncope type - Plan: Ambulatory referral to Cardiology  Hypokalemia - Plan: Magnesium, Basic metabolic panel with GFR  Refer to cardiology for their opinion.  I would not change any medications at this time. Follow-up on labs. Follow-up as originally scheduled. The patient voiced understanding and agreement to the plan.  Shellie Dials Proctor, DO 11/07/23  11:50 AM

## 2023-11-07 NOTE — Patient Instructions (Signed)
Give us 2-3 business days to get the results of your labs back.   If you do not hear anything about your referral in the next 1-2 weeks, call our office and ask for an update.  Stay hydrated.   Let us know if you need anything.  

## 2023-11-08 LAB — CYTOLOGY - PAP: Diagnosis: NEGATIVE

## 2023-11-11 ENCOUNTER — Encounter: Payer: Self-pay | Admitting: Obstetrics and Gynecology

## 2023-11-14 DIAGNOSIS — T7840XA Allergy, unspecified, initial encounter: Secondary | ICD-10-CM | POA: Insufficient documentation

## 2023-11-14 DIAGNOSIS — M1990 Unspecified osteoarthritis, unspecified site: Secondary | ICD-10-CM | POA: Insufficient documentation

## 2023-11-14 DIAGNOSIS — K219 Gastro-esophageal reflux disease without esophagitis: Secondary | ICD-10-CM | POA: Insufficient documentation

## 2023-11-14 DIAGNOSIS — E119 Type 2 diabetes mellitus without complications: Secondary | ICD-10-CM | POA: Insufficient documentation

## 2023-11-14 DIAGNOSIS — Z8719 Personal history of other diseases of the digestive system: Secondary | ICD-10-CM | POA: Insufficient documentation

## 2023-11-14 DIAGNOSIS — F419 Anxiety disorder, unspecified: Secondary | ICD-10-CM | POA: Insufficient documentation

## 2023-11-14 DIAGNOSIS — I10 Essential (primary) hypertension: Secondary | ICD-10-CM | POA: Insufficient documentation

## 2023-11-14 DIAGNOSIS — F32A Depression, unspecified: Secondary | ICD-10-CM | POA: Insufficient documentation

## 2023-11-19 ENCOUNTER — Other Ambulatory Visit (HOSPITAL_BASED_OUTPATIENT_CLINIC_OR_DEPARTMENT_OTHER): Payer: Self-pay

## 2023-11-19 ENCOUNTER — Ambulatory Visit: Attending: Cardiology | Admitting: Cardiology

## 2023-11-19 ENCOUNTER — Encounter: Payer: Self-pay | Admitting: Cardiology

## 2023-11-19 ENCOUNTER — Other Ambulatory Visit: Payer: Self-pay

## 2023-11-19 ENCOUNTER — Ambulatory Visit: Attending: Cardiology

## 2023-11-19 VITALS — BP 122/80 | HR 97 | Ht 62.0 in | Wt 213.1 lb

## 2023-11-19 DIAGNOSIS — G4733 Obstructive sleep apnea (adult) (pediatric): Secondary | ICD-10-CM | POA: Diagnosis not present

## 2023-11-19 DIAGNOSIS — R002 Palpitations: Secondary | ICD-10-CM | POA: Insufficient documentation

## 2023-11-19 DIAGNOSIS — E7800 Pure hypercholesterolemia, unspecified: Secondary | ICD-10-CM | POA: Diagnosis not present

## 2023-11-19 DIAGNOSIS — R0609 Other forms of dyspnea: Secondary | ICD-10-CM | POA: Diagnosis not present

## 2023-11-19 DIAGNOSIS — I10 Essential (primary) hypertension: Secondary | ICD-10-CM

## 2023-11-19 DIAGNOSIS — R55 Syncope and collapse: Secondary | ICD-10-CM | POA: Insufficient documentation

## 2023-11-19 DIAGNOSIS — R011 Cardiac murmur, unspecified: Secondary | ICD-10-CM | POA: Diagnosis not present

## 2023-11-19 MED ORDER — AMLODIPINE BESYLATE 5 MG PO TABS
5.0000 mg | ORAL_TABLET | Freq: Every day | ORAL | 3 refills | Status: AC
Start: 2023-11-19 — End: ?
  Filled 2023-11-19 (×2): qty 90, 90d supply, fill #0

## 2023-11-19 NOTE — Patient Instructions (Signed)
 Medication Instructions:  Your physician has recommended you make the following change in your medication:   START: Amlodipine  5 mg daily  *If you need a refill on your cardiac medications before your next appointment, please call your pharmacy*  Lab Work: None If you have labs (blood work) drawn today and your tests are completely normal, you will receive your results only by: MyChart Message (if you have MyChart) OR A paper copy in the mail If you have any lab test that is abnormal or we need to change your treatment, we will call you to review the results.  Testing/Procedures: Your physician has requested that you have an echocardiogram. Echocardiography is a painless test that uses sound waves to create images of your heart. It provides your doctor with information about the size and shape of your heart and how well your heart's chambers and valves are working. This procedure takes approximately one hour. There are no restrictions for this procedure. Please do NOT wear cologne, perfume, aftershave, or lotions (deodorant is allowed). Please arrive 15 minutes prior to your appointment time.  Please note: We ask at that you not bring children with you during ultrasound (echo/ vascular) testing. Due to room size and safety concerns, children are not allowed in the ultrasound rooms during exams. Our front office staff cannot provide observation of children in our lobby area while testing is being conducted. An adult accompanying a patient to their appointment will only be allowed in the ultrasound room at the discretion of the ultrasound technician under special circumstances. We apologize for any inconvenience.  A zio monitor was ordered today. It will remain on for 14 days. You will then return monitor and event diary in provided box. It takes 1-2 weeks for report to be downloaded and returned to us . We will call you with the results. If monitor falls off or has orange flashing light, please  call Zio for further instructions.     [facility] at [facility] [address] [place], [place] [address] Phone: [phone]    Please arrive 15 minutes prior to your appointment time for registration and insurance purposes.  The test will take approximately 3 to 4 hours to complete; you may bring reading material.  If someone comes with you to your appointment, they will need to remain in the main lobby due to limited space in the testing area. **If you are pregnant or breastfeeding, please notify the nuclear lab prior to your appointment**  How to prepare for your Myocardial Perfusion Test: Do not eat or drink 3 hours prior to your test, except you may have water. Do not consume products containing caffeine (regular or decaffeinated) 12 hours prior to your test. (ex: coffee, chocolate, sodas, tea). Do bring a list of your current medications with you.  If not listed below, you may take your medications as normal. Do not take carvedilol  (Coreg ) for 24 hours prior to the test.  Bring the medication to your appointment as you may be required to take it once the test is complete. Do wear comfortable clothes (no dresses or overalls) and walking shoes, tennis shoes preferred (No heels or open toe shoes are allowed). Do NOT wear cologne, perfume, aftershave, or lotions (deodorant is allowed). If these instructions are not followed, your test will have to be rescheduled.  Please report to [address] ([facility]), 2nd Floor, for your test.  If you have questions or concerns about  your appointment, you can call the Nuclear Lab at [phone].  If you cannot keep your appointment, please provide 24 hours notification to the Nuclear Lab, to avoid a possible $50 charge to your account.   Follow-Up: At [facility], you and your health needs are our priority.  As part of our continuing mission to provide  you with exceptional heart care, our providers are all part of one team.  This team includes your primary Cardiologist (physician) and Advanced Practice Providers or APPs (Physician Assistants and Nurse Practitioners) who all work together to provide you with the care you need, when you need it.  Your next appointment:   9 month(s)  Provider:   Hillis Lu, MD    We recommend signing up for the patient portal called "MyChart".  Sign up information is provided on this After Visit Summary.  MyChart is used to connect with patients for Virtual Visits (Telemedicine).  Patients are able to view lab/test results, encounter notes, upcoming appointments, etc.  Non-urgent messages can be sent to your provider as well.   To learn more about what you can do with MyChart, go to [email or web address].   Other Instructions Please keep a BP log for 1 week and send by MyChart or mail.                          Name and DOB__________________________ Dr. Lafayette Pierre [address] [place], [place] [address]  Blood Pressure Record Sheet To take your blood pressure, you will need a blood pressure machine. You can buy a blood pressure machine (blood pressure monitor) at your clinic, drug store, or online. When choosing one, consider: An automatic monitor that has an arm cuff. A cuff that wraps snugly around your upper arm. You should be able to fit only one finger between your arm and the cuff. A device that stores blood pressure reading results. Do not choose a monitor that measures your blood pressure from your wrist or finger. Follow your health care provider's instructions for how to take your blood pressure. To use this form: Get one reading in the morning (a.m.) 1-2 hours after you take any medicines. Get one reading in the evening (p.m.) before supper.   Blood pressure log Date: _______________________  a.m. _____________________(1st reading) HR___________            p.m. _____________________(2nd  reading) HR__________  Date: _______________________  a.m. _____________________(1st reading) HR___________            p.m. _____________________(2nd reading) HR__________  Date: _______________________  a.m. _____________________(1st reading) HR___________            p.m. _____________________(2nd reading) HR__________  Date: _______________________  a.m. _____________________(1st reading) HR___________            p.m. _____________________(2nd reading) HR__________  Date: _______________________  a.m. _____________________(1st reading) HR___________            p.m. _____________________(2nd reading) HR__________  Date: _______________________  a.m. _____________________(1st reading) HR___________            p.m. _____________________(2nd reading) HR__________  Date: _______________________  a.m. _____________________(1st reading) HR___________            p.m. _____________________(2nd reading) HR__________   This information is not intended to replace advice given to you by your health care provider. Make sure you discuss any questions you have with your health care provider. Document Revised: 10/28/2019 Document Reviewed: 10/28/2019 Elsevier Patient Education  2021 [employer].

## 2023-11-19 NOTE — Progress Notes (Signed)
 Cardiology Office Note:    Date:  11/19/2023   ID:  Katelyn Fisher, DOB 29-Mar-1951, MRN [id number]  PCP:  Katelyn Mulder, DO  Cardiologist:  Katelyn Balzarine, MD   Referring MD: Katelyn Fisher*    ASSESSMENT:    1. Pure hypercholesterolemia   2. Essential hypertension   3. Obstructive sleep apnea   4. Syncope and collapse   5. Morbid obesity (HCC)   6. Dyspnea on exertion   7. Palpitations   8. Cardiac murmur    PLAN:    In order of problems listed above:  Primary prevention stressed with the patient.  Importance of compliance with diet medication stressed and patient verbalized standing. Dyspnea on exertion: She has some of this and would like to be stress tested and we will do this for her.  She has multiple risk factors for coronary artery disease. Cardiac murmur: Echocardiogram will be done to assess murmur heard on auscultation. Palpitations: She has occasional palpitations unrelated to the syncopal episode.  Will do a 2-week monitor. Reviewed emergency room records. Essential hypertension: Blood pressure stable but borderline so I will cut down amlodipine  to half dose which is 5 mg a day.  She will get back to us  the blood pressure readings from home in the next few days. Mixed dyslipidemia and diet-controlled diabetes mellitus and morbid obesity: Weight reduction stressed diet emphasized and she promises to do better. Sleep apnea: Sleep health issues were discussed. Patient will be seen in follow-up appointment in 6 months or earlier if the patient has any concerns.  She was advised not to drive until cleared by primary care in view of syncope and she understands.   Medication Adjustments/Labs and Tests Ordered: Current medicines are reviewed at length with the patient today.  Concerns regarding medicines are outlined above.  Orders Placed This Encounter  Procedures   EKG 12-Lead   No orders of the defined types were placed in this  encounter.    History of Present Illness:    Katelyn Fisher is a 73 y.o. female who is being seen today for the evaluation of syncope at the request of Katelyn Fisher*.  Patient is a pleasant 73 year old female.  She has past medical history of essential hypertension, dyslipidemia, diet-controlled diabetes mellitus, obstructive sleep apnea and morbid obesity.  She went to church and came back.  She had a close and at about 12 and she was on the porch at about 130 and at that time she had a syncopal spell.  She tells me that she saw stars and then she passed out.  No chest pain orthopnea or PND.  No seizure-like activity.  She walks about half an hour a day on a daily basis without any symptoms.  She has never had this recurrence before.  At the time of my evaluation, the patient is alert awake oriented and in no distress.  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   Anemia 11/07/2011   Anxiety    Arthritis    back   Asymptomatic postmenopausal status 10/01/2008   Qualifier: Diagnosis of   By: Maebelle Schmid      IMO SNOMED Dx Update Oct 2024     Chronic rhinitis 08/21/2021   Current use of  beta blocker 06/30/2018   Depression    Diabetes mellitus without complication [facility])    Endometrial cancer (HCC) 2015   Essential hypertension 08/13/2006   Qualifier: Diagnosis of   By: Nilsa Bash         Family history of breast cancer    Family history of colon cancer    Family history of ovarian cancer    Family history of prostate cancer    Flank pain 09/14/2019   Gastroesophageal reflux disease without esophagitis 09/14/2010   Qualifier: Diagnosis of   By: Sanford Crumble CMA, Darlene         GERD (gastroesophageal reflux disease)    H/O total hysterectomy with bilateral  salpingo-oophorectomy (BSO) 06/24/2014   Formatting of this note might be different from the original.  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     History of colonic diverticulitis    History of colonic polyps 03/10/2008   Qualifier: Diagnosis of   By: Celestia Colander CMA Nonie Beady), Leisha      IMO SNOMED Dx Update Oct 2024     History of endometrial cancer 02/03/2018   Hyperlipidemia    Hypertension    Hypothyroidism 06/01/2013   hypothyroid     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   Low back pain radiating to right leg 08/18/2012   Mass of left elbow 09/14/2019   Microcytosis 09/28/2016   Mild intermittent asthma without complication 01/26/2016   Moderate persistent asthma without complication 08/07/2018   Morbid obesity (HCC) 11/09/2014   Obstructive sleep apnea 11/15/2010   Osteoarthritis 11/15/2010   B/l knees     Osteopenia 09/2017   T score -1.2 FRAX 3.2% / 0.3%   Perennial allergic rhinitis 06/30/2018   Personal history of chemotherapy 2015   Port catheter in place 11/15/2015   Screening Mammogram 11/05/2017   Subacromial impingement of left shoulder 12/30/2018   Thyroid  disease 06/01/2013   Type 2 diabetes mellitus with diabetic neuropathy, without long-term current use of insulin (HCC) 10/25/2016   Type 2 diabetes mellitus with obesity (HCC) 03/12/2011    Past Surgical History:  Procedure Laterality Date   ABDOMINAL HYSTERECTOMY  06/23/2014   [facility], TRH/BSO   CHOLECYSTECTOMY  1991   COLONOSCOPY     DILATION AND CURETTAGE OF UTERUS     IR REMOVAL TUN ACCESS W/ PORT W/O FL MOD SED  04/30/2017   POLYPECTOMY     TUBAL LIGATION      Current Medications: Current Meds  Medication Sig   albuterol  (PROAIR  HFA) 108 (90 Base) MCG/ACT inhaler Inhale 2 puffs by mouth into the lungs every 4 (four) hours as needed for coughing or wheezing spells.   albuterol  (PROVENTIL ) (2.5 MG/3ML) 0.083% nebulizer solution Take 3 mLs (2.5 mg total) by  nebulization every 4 (four) hours as needed for wheezing or shortness of breath.   atorvastatin  (LIPITOR) 40 MG tablet TAKE 1 TABLET BY MOUTH DAILY   benazepril  (LOTENSIN ) 20 MG tablet TAKE 1 TABLET BY MOUTH AT  BEDTIME   betamethasone  dipropionate (DIPROLENE ) 0.05 % ointment Apply 1 Application topically daily.   Betamethasone  Dipropionate POWD 1 application Externally Once a day prn 30 days   budesonide -formoterol  (SYMBICORT ) 160-4.5 MCG/ACT inhaler Inhale 2 puffs into the lungs as needed.   carvedilol  (COREG ) 12.5 MG tablet TAKE 1 TABLET BY MOUTH TWICE  DAILY WITH A MEAL   EPINEPHrine  (EPIPEN  2-PAK) 0.3 mg/0.3 mL IJ SOAJ injection Use as directed for severe allergic reaction.   furosemide  (LASIX ) 20 MG  tablet TAKE 1 TABLET BY MOUTH DAILY   ketoconazole  (NIZORAL ) 2 % cream Apply 1 Application topically daily.   levocetirizine (XYZAL ) 5 MG tablet Take 1 tablet (5 mg total) by mouth every evening.   levothyroxine  (SYNTHROID ) 75 MCG tablet TAKE 1 TABLET (75 MCG TOTAL) BY MOUTH DAILY BEFORE BREAKFAST. Oral for 30   montelukast  (SINGULAIR ) 10 MG tablet Take 1 tablet (10 mg total) by mouth at bedtime.   Multiple Vitamin (MULTIVITAMIN) tablet Take 1 tablet by mouth daily.   Semaglutide  (RYBELSUS ) 7 MG TABS Take 1 tablet (7 mg total) by mouth daily.   traMADol  (ULTRAM ) 50 MG tablet Take 1 tablet (50 mg total) by mouth 3 (three) times daily as needed.   [DISCONTINUED] amLODipine  (NORVASC ) 10 MG tablet TAKE 1 TABLET BY MOUTH DAILY     Allergies:   Shellfish allergy, Oxycodone, Penicillins, Iodinated contrast media, and Sulfa antibiotics   Social History   Socioeconomic History   Marital status: Married    Spouse name: Not on file   Number of children: 3   Years of education: Not on file   Highest education level: Some college, no degree  Occupational History   Occupation: retired Diplomatic Services operational officer  Tobacco Use   Smoking status: Never    Passive exposure: Never   Smokeless tobacco: Never  Vaping  Use   Vaping status: Never Used  Substance and Sexual Activity   Alcohol use: No    Alcohol/week: 0.0 standard drinks of alcohol   Drug use: No   Sexual activity: Yes    Partners: Male    Birth control/protection: Surgical    Comment: 1st intercourse- 15, partners- 3, hysterectomy  Other Topics Concern   Not on file  Social History Narrative   Married- 40 years   Never Smoked   Alcohol use-no   Drug use-no   Occupation: housewife   Caffeine use/day:  None   Does Patient Exercise:  yes   Social Drivers of Corporate investment banker Strain: Low Risk  (07/26/2023)   Overall Financial Resource Strain (CARDIA)    Difficulty of Paying Living Expenses: Not very hard  Food Insecurity: No Food Insecurity (07/26/2023)   Hunger Vital Sign    Worried About Running Out of Food in the Last Year: Never true    Ran Out of Food in the Last Year: Never true  Transportation Needs: No Transportation Needs (07/26/2023)   PRAPARE - Administrator, Civil Service (Medical): No    Lack of Transportation (Non-Medical): No  Physical Activity: Insufficiently Active (07/26/2023)   Exercise Vital Sign    Days of Exercise per Week: 3 days    Minutes of Exercise per Session: 30 min  Stress: No Stress Concern Present (07/26/2023)   [employer] of Occupational Health - Occupational Stress Questionnaire    Feeling of Stress : Not at all  Social Connections: Socially Integrated (07/26/2023)   Social Connection and Isolation Panel [NHANES]    Frequency of Communication with Friends and Family: More than three times a week    Frequency of Social Gatherings with Friends and Family: More than three times a week    Attends Religious Services: More than 4 times per year    Active Member of [employer] or Organizations: Yes    Attends Engineer, structural: More than 4 times per year    Marital Status: Married     Family History: The patient's family history includes Breast cancer in her maternal  aunt and  another family member; Cancer in her father, maternal aunt, and paternal aunt; Colon cancer in her maternal grandmother; Colon polyps in her brother and brother; Diabetes in her brother, brother, brother, father, and mother; Heart disease in her brother, brother, brother, brother, and maternal aunt; Hyperlipidemia in her sister; Hypertension in her maternal aunt; Leukemia (age of onset: 17) in her maternal uncle; Lupus in her mother; Ovarian cancer in her maternal aunt; Prostate cancer in her paternal uncle; Prostate cancer (age of onset: 35) in her father.  ROS:   Please see the history of present illness.    All other systems reviewed and are negative.  EKGs/Labs/Other Studies Reviewed:    The following studies were reviewed today:  EKG Interpretation Date/Time:  Tuesday November 19 2023 09:58:01 EDT Ventricular Rate:  97 PR Interval:  126 QRS Duration:  96 QT Interval:  332 QTC Calculation: 421 R Axis:   16  Text Interpretation: Normal sinus rhythm Nonspecific T wave abnormality When compared with ECG of 03-Nov-2023 17:43, PREVIOUS ECG IS PRESENT Confirmed by Hillis Lu [phone]) on 11/19/2023 10:06:10 AM     Recent Labs: 11/03/2023: ALT 20; Hemoglobin 12.1; Platelets 176 11/07/2023: BUN 10; Creatinine, Ser 0.85; Magnesium 2.0; Potassium 4.6; Sodium 142  Recent Lipid Panel    Component Value Date/Time   CHOL 172 10/11/2023 0734   TRIG 86.0 10/11/2023 0734   HDL 64.30 10/11/2023 0734   CHOLHDL 3 10/11/2023 0734   VLDL 17.2 10/11/2023 0734   LDLCALC 90 10/11/2023 0734   LDLDIRECT 71.0 11/19/2014 0924    Physical Exam:    VS:  BP 122/80   Pulse 97   Ht 5\' 2"  (1.575 m)   Wt 213 lb 1.9 oz (96.7 kg)   SpO2 97%   BMI 38.98 kg/m     Wt Readings from Last 3 Encounters:  11/19/23 213 lb 1.9 oz (96.7 kg)  11/07/23 215 lb 9.6 oz (97.8 kg)  11/03/23 214 lb (97.1 kg)     GEN: Patient is in no acute distress HEENT: Normal NECK: No JVD; No carotid bruits LYMPHATICS:  No lymphadenopathy CARDIAC: S1 S2 regular, 2/6 systolic murmur at the apex. RESPIRATORY:  Clear to auscultation without rales, wheezing or rhonchi  ABDOMEN: Soft, non-tender, non-distended MUSCULOSKELETAL:  No edema; No deformity  SKIN: Warm and dry NEUROLOGIC:  Alert and oriented x 3 PSYCHIATRIC:  Normal affect    Signed, Katelyn Balzarine, MD  11/19/2023 10:18 AM    [facility]

## 2023-11-21 ENCOUNTER — Telehealth: Payer: Self-pay | Admitting: *Deleted

## 2023-11-21 NOTE — Telephone Encounter (Signed)
 Left detailed instructions for MPI study.

## 2023-11-27 ENCOUNTER — Other Ambulatory Visit: Payer: Self-pay | Admitting: Cardiology

## 2023-11-27 DIAGNOSIS — I10 Essential (primary) hypertension: Secondary | ICD-10-CM

## 2023-11-27 DIAGNOSIS — R011 Cardiac murmur, unspecified: Secondary | ICD-10-CM

## 2023-11-27 DIAGNOSIS — G4733 Obstructive sleep apnea (adult) (pediatric): Secondary | ICD-10-CM

## 2023-11-27 DIAGNOSIS — R55 Syncope and collapse: Secondary | ICD-10-CM

## 2023-11-27 DIAGNOSIS — R002 Palpitations: Secondary | ICD-10-CM

## 2023-11-27 DIAGNOSIS — R0609 Other forms of dyspnea: Secondary | ICD-10-CM

## 2023-11-27 DIAGNOSIS — E7800 Pure hypercholesterolemia, unspecified: Secondary | ICD-10-CM

## 2023-11-28 ENCOUNTER — Ambulatory Visit (HOSPITAL_COMMUNITY): Attending: Cardiology

## 2023-11-28 ENCOUNTER — Other Ambulatory Visit: Payer: Self-pay | Admitting: Cardiology

## 2023-11-28 DIAGNOSIS — R55 Syncope and collapse: Secondary | ICD-10-CM

## 2023-11-28 DIAGNOSIS — I10 Essential (primary) hypertension: Secondary | ICD-10-CM

## 2023-11-28 DIAGNOSIS — R0609 Other forms of dyspnea: Secondary | ICD-10-CM

## 2023-11-28 DIAGNOSIS — R002 Palpitations: Secondary | ICD-10-CM | POA: Diagnosis not present

## 2023-11-28 DIAGNOSIS — E7800 Pure hypercholesterolemia, unspecified: Secondary | ICD-10-CM | POA: Diagnosis not present

## 2023-11-28 DIAGNOSIS — G4733 Obstructive sleep apnea (adult) (pediatric): Secondary | ICD-10-CM

## 2023-11-28 DIAGNOSIS — R011 Cardiac murmur, unspecified: Secondary | ICD-10-CM | POA: Insufficient documentation

## 2023-11-28 DIAGNOSIS — Z0189 Encounter for other specified special examinations: Secondary | ICD-10-CM | POA: Diagnosis not present

## 2023-11-28 LAB — MYOCARDIAL PERFUSION IMAGING
Angina Index: 0
Duke Treadmill Score: 4
Estimated workload: 5.6
Exercise duration (min): 4 min
Exercise duration (sec): 15 s
LV dias vol: 53 mL (ref 46–106)
LV sys vol: 16 mL
MPHR: 149 {beats}/min
Nuc Stress EF: 70 %
Peak HR: 171 {beats}/min
Percent HR: 114 %
Rest HR: 86 {beats}/min
Rest Nuclear Isotope Dose: 10.9 mCi
SDS: 1
SRS: 6
SSS: 1
ST Depression (mm): 0 mm
Stress Nuclear Isotope Dose: 32.1 mCi
TID: 0.87

## 2023-11-28 MED ORDER — TECHNETIUM TC 99M TETROFOSMIN IV KIT
32.1000 | PACK | Freq: Once | INTRAVENOUS | Status: AC | PRN
  Administered 2023-11-28: 32.1 via INTRAVENOUS

## 2023-11-28 MED ORDER — TECHNETIUM TC 99M TETROFOSMIN IV KIT
10.5000 | PACK | Freq: Once | INTRAVENOUS | Status: AC | PRN
  Administered 2023-11-28: 10.5 via INTRAVENOUS

## 2023-12-02 ENCOUNTER — Other Ambulatory Visit (HOSPITAL_BASED_OUTPATIENT_CLINIC_OR_DEPARTMENT_OTHER): Payer: Self-pay

## 2023-12-04 ENCOUNTER — Encounter: Payer: Self-pay | Admitting: Family Medicine

## 2023-12-04 ENCOUNTER — Ambulatory Visit: Admitting: Family Medicine

## 2023-12-04 VITALS — BP 124/82 | HR 92 | Temp 98.0°F | Resp 16 | Ht 62.0 in | Wt 215.0 lb

## 2023-12-04 DIAGNOSIS — L729 Follicular cyst of the skin and subcutaneous tissue, unspecified: Secondary | ICD-10-CM

## 2023-12-04 DIAGNOSIS — L0292 Furuncle, unspecified: Secondary | ICD-10-CM | POA: Diagnosis not present

## 2023-12-04 NOTE — Progress Notes (Signed)
 . Chief Complaint  Patient presents with   Mass    Bump behind Right Ear and Lump Right side    Katelyn Fisher is a 73 y.o. female here for a skin complaint.  Duration: 2 weeks Location: R side Pruritic? No Painful? Yes- soreness Drainage? No New soaps/lotions/topicals/detergents? No Trauma? No Other associated symptoms: no fevers, recent illness Therapies tried thus far: none  She noticed a spot yesterday behind her R ear that is painful and raised. No itching or drainage, fevers, trauma, new topicals. Has not tried anything at home.   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   Anemia 11/07/2011   Anxiety    Arthritis    back   Asymptomatic postmenopausal status 10/01/2008   Qualifier: Diagnosis of   By: Maebelle Schmid      IMO SNOMED Dx Update Oct 2024     Chronic rhinitis 08/21/2021   Current use of beta blocker 06/30/2018   Depression    Diabetes mellitus without complication (HCC)    Endometrial cancer (HCC) 2015   Essential hypertension 08/13/2006   Qualifier: Diagnosis of   By: Nilsa Bash         Family history of breast cancer    Family history of colon cancer    Family history of ovarian cancer    Family history of prostate cancer    Flank pain 09/14/2019   Gastroesophageal reflux disease without esophagitis 09/14/2010   Qualifier: Diagnosis of   By: Sanford Crumble CMA, Darlene         GERD (gastroesophageal reflux disease)    H/O total hysterectomy with bilateral salpingo-oophorectomy (BSO) 06/24/2014   Formatting of this note might be different from the original.  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     History of colonic diverticulitis    History of colonic polyps 03/10/2008   Qualifier: Diagnosis  of   By: Celestia Colander CMA Nonie Beady), Leisha      IMO SNOMED Dx Update Oct 2024     History of endometrial cancer 02/03/2018   Hyperlipidemia    Hypertension    Hypothyroidism 06/01/2013   hypothyroid     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   Low back pain radiating to right leg 08/18/2012   Mass of left elbow 09/14/2019   Microcytosis 09/28/2016   Mild intermittent asthma without complication 01/26/2016   Moderate persistent asthma without complication 08/07/2018   Morbid obesity (HCC) 11/09/2014   Obstructive sleep apnea 11/15/2010   Osteoarthritis 11/15/2010   B/l knees     Osteopenia 09/2017   T score -1.2 FRAX 3.2% / 0.3%   Perennial allergic rhinitis 06/30/2018   Personal history of chemotherapy 2015   Port catheter in place 11/15/2015   Screening Mammogram 11/05/2017   Subacromial impingement of left shoulder 12/30/2018   Thyroid  disease 06/01/2013   Type 2 diabetes mellitus with diabetic neuropathy, without long-term current use of insulin (HCC) 10/25/2016   Type 2 diabetes mellitus with obesity (HCC) 03/12/2011    BP 124/82 (BP Location: Left Arm, Patient Position: Sitting)   Pulse 92   Temp 98 F (36.7 C) (Oral)   Resp 16   Ht 5\' 2"  (1.575 m)   Wt 215 lb (97.5 kg)   SpO2 96%  BMI 39.32 kg/m  Gen: awake, alert, appearing stated age Lungs: No accessory muscle use Skin: See below.  Mild TTP with the pustule.  No fluctuance, drainage, excessive warmth, spreading redness. Over the right abdominal wall region there are several rubbery elliptically shaped lesions that are freely movable.  Mild TTP.  No fluctuance, erythema, edema, drainage, ecchymosis. Psych: Age appropriate judgment and insight    Furuncle  Cyst of skin  TAO bid for 7-10 d. Warning signs and symptoms verbalized and written down in AVS.  Ice, heat, Tylenol .  Reassurance.  Send message if not improving and we will consider referral to a dermatologist. F/u as originally scheduled. The patient voiced  understanding and agreement to the plan.  Shellie Dials Kingman, DO 12/04/23 1:02 PM

## 2023-12-04 NOTE — Patient Instructions (Addendum)
 Ice/cold pack over area for 10-15 min twice daily.  Heat (pad or rice pillow in microwave) over affected area, 10-15 minutes twice daily.   OK to take Tylenol  1000 mg (2 extra strength tabs) or 975 mg (3 regular strength tabs) every 6 hours as needed.  When you do wash it, use only soap and water. Do not vigorously scrub. Apply triple antibiotic ointment (like Neosporin) twice daily. Keep the area clean and dry.   Things to look out for: increasing pain not relieved by ibuprofen/acetaminophen , fevers, spreading redness, drainage of pus, or foul odor.  Let us  know if you need anything.

## 2023-12-07 DIAGNOSIS — R002 Palpitations: Secondary | ICD-10-CM | POA: Diagnosis not present

## 2023-12-07 DIAGNOSIS — R55 Syncope and collapse: Secondary | ICD-10-CM | POA: Diagnosis not present

## 2023-12-10 ENCOUNTER — Ambulatory Visit (HOSPITAL_BASED_OUTPATIENT_CLINIC_OR_DEPARTMENT_OTHER)
Admission: RE | Admit: 2023-12-10 | Discharge: 2023-12-10 | Disposition: A | Discharge status: Home / Self Care | Source: Ambulatory Visit | Attending: Cardiology | Admitting: Cardiology

## 2023-12-10 ENCOUNTER — Ambulatory Visit: Payer: Self-pay | Admitting: Cardiology

## 2023-12-10 DIAGNOSIS — R011 Cardiac murmur, unspecified: Secondary | ICD-10-CM | POA: Diagnosis not present

## 2023-12-10 DIAGNOSIS — R0609 Other forms of dyspnea: Secondary | ICD-10-CM | POA: Insufficient documentation

## 2023-12-10 DIAGNOSIS — R55 Syncope and collapse: Secondary | ICD-10-CM | POA: Diagnosis not present

## 2023-12-10 DIAGNOSIS — R002 Palpitations: Secondary | ICD-10-CM | POA: Diagnosis not present

## 2023-12-11 LAB — ECHOCARDIOGRAM COMPLETE
AR max vel: 2.04 cm2
AV Area VTI: 1.94 cm2
AV Area mean vel: 1.91 cm2
AV Mean grad: 4 mmHg
AV Peak grad: 7.4 mmHg
Ao pk vel: 1.36 m/s
Area-P 1/2: 5.31 cm2
Calc EF: 60.9 %
MV M vel: 4.53 m/s
MV Peak grad: 82.1 mmHg
S' Lateral: 2.2 cm
Single Plane A2C EF: 61.7 %
Single Plane A4C EF: 60.6 %

## 2023-12-23 ENCOUNTER — Inpatient Hospital Stay (HOSPITAL_BASED_OUTPATIENT_CLINIC_OR_DEPARTMENT_OTHER): Admission: RE | Admit: 2023-12-23 | Source: Ambulatory Visit

## 2023-12-23 ENCOUNTER — Ambulatory Visit (HOSPITAL_BASED_OUTPATIENT_CLINIC_OR_DEPARTMENT_OTHER)
Admission: RE | Admit: 2023-12-23 | Discharge: 2023-12-23 | Disposition: A | Discharge status: Home / Self Care | Source: Ambulatory Visit | Attending: Family Medicine | Admitting: Family Medicine

## 2023-12-23 ENCOUNTER — Encounter (HOSPITAL_BASED_OUTPATIENT_CLINIC_OR_DEPARTMENT_OTHER): Payer: Self-pay

## 2023-12-23 ENCOUNTER — Ambulatory Visit: Payer: Self-pay | Admitting: Cardiology

## 2023-12-23 DIAGNOSIS — Z1231 Encounter for screening mammogram for malignant neoplasm of breast: Secondary | ICD-10-CM | POA: Insufficient documentation

## 2023-12-23 DIAGNOSIS — R002 Palpitations: Secondary | ICD-10-CM

## 2023-12-23 DIAGNOSIS — R55 Syncope and collapse: Secondary | ICD-10-CM | POA: Diagnosis not present

## 2023-12-23 DIAGNOSIS — Z139 Encounter for screening, unspecified: Secondary | ICD-10-CM

## 2023-12-25 ENCOUNTER — Ambulatory Visit: Payer: Self-pay | Admitting: Cardiology

## 2023-12-26 NOTE — Telephone Encounter (Signed)
-----   Message from [place] R Revankar sent at 12/25/2023 10:52 AM EDT ----- Mildly abnormal but largely unremarkable event monitor.  Continue current medications.  Copy primary care Nelia Balzarine, MD 12/25/2023 10:51 AM

## 2023-12-26 NOTE — Telephone Encounter (Signed)
 MyChat and VM left for pt

## 2024-01-06 ENCOUNTER — Other Ambulatory Visit (HOSPITAL_BASED_OUTPATIENT_CLINIC_OR_DEPARTMENT_OTHER): Payer: Self-pay

## 2024-01-07 ENCOUNTER — Other Ambulatory Visit: Payer: Self-pay

## 2024-01-07 ENCOUNTER — Other Ambulatory Visit (HOSPITAL_BASED_OUTPATIENT_CLINIC_OR_DEPARTMENT_OTHER): Payer: Self-pay

## 2024-01-07 DIAGNOSIS — L821 Other seborrheic keratosis: Secondary | ICD-10-CM | POA: Diagnosis not present

## 2024-01-07 DIAGNOSIS — D239 Other benign neoplasm of skin, unspecified: Secondary | ICD-10-CM | POA: Diagnosis not present

## 2024-01-07 DIAGNOSIS — G8929 Other chronic pain: Secondary | ICD-10-CM | POA: Diagnosis not present

## 2024-01-07 DIAGNOSIS — L249 Irritant contact dermatitis, unspecified cause: Secondary | ICD-10-CM | POA: Diagnosis not present

## 2024-01-07 DIAGNOSIS — D225 Melanocytic nevi of trunk: Secondary | ICD-10-CM | POA: Diagnosis not present

## 2024-01-07 MED ORDER — BETAMETHASONE DIPROPIONATE 0.05 % EX OINT
TOPICAL_OINTMENT | Freq: Every day | CUTANEOUS | 0 refills | Status: AC | PRN
  Filled 2024-01-07: qty 90, 30d supply, fill #0

## 2024-01-08 ENCOUNTER — Other Ambulatory Visit (HOSPITAL_BASED_OUTPATIENT_CLINIC_OR_DEPARTMENT_OTHER): Payer: Self-pay

## 2024-01-28 ENCOUNTER — Ambulatory Visit: Payer: Medicare Other | Admitting: Family Medicine

## 2024-01-29 LAB — HM DIABETES EYE EXAM

## 2024-02-03 ENCOUNTER — Other Ambulatory Visit (HOSPITAL_BASED_OUTPATIENT_CLINIC_OR_DEPARTMENT_OTHER): Payer: Self-pay

## 2024-02-03 NOTE — Progress Notes (Unsigned)
   [address] [place] [address] Dept: [phone]  FOLLOW UP NOTE  Patient ID: Katelyn Fisher, female    DOB: 07-05-1951  Age: 73 y.o. MRN: [id number] Date of Office Visit: 02/04/2024  Assessment  Chief Complaint: No chief complaint on file.  HPI Katelyn Fisher is a 73 year old female who presents to the clinic for follow-up visit.  She was last seen in this clinic on 07/30/2023 by Arlean Mutter, FNP, for evaluation of asthma, allergic rhinitis, reflux, and food allergy to shellfish.  Her last environmental allergy skin testing was on 05/25/2010 that was positive to dust mite and negative to the remainder of the adult environmental panel.  Discussed the use of AI scribe software for clinical note transcription with the patient, who gave verbal consent to proceed.  History of Present Illness      Drug Allergies:  Allergies  Allergen Reactions   Shellfish Allergy Hives, Shortness Of Breath and Swelling   Oxycodone Swelling    Facial swelling and tightness   Penicillins Swelling    Rash with welps   Iodinated Contrast Media Rash   Sulfa Antibiotics Rash    Physical Exam: There were no vitals taken for this visit.   Physical Exam  Diagnostics:    Assessment and Plan: No diagnosis found.  No orders of the defined types were placed in this encounter.   There are no Patient Instructions on file for this visit.  No follow-ups on file.    Thank you for the opportunity to care for this patient.  Please do not hesitate to contact me with questions.  Arlean Mutter, FNP [facility]

## 2024-02-03 NOTE — Patient Instructions (Incomplete)
 Asthma Continue montelukast  (Singulair ) 10 mg once a day to prevent cough or wheeze Continue Symbicort  160-2 puffs once a day to prevent cough or wheeze Continue albuterol  2 puffs every 4 hours as needed for cough or wheeze OR Instead use albuterol  0.083% solution via nebulizer one unit vial every 4 hours as needed for cough or wheeze  You may use albuterol  2 puffs 5 to 15 minutes before activity to decrease cough or wheeze For asthma flare, increase Symbicort  160 to 2 puffs twice a day for 1 to 2 weeks or until cough and wheeze free, then return to original dosing  Allergic rhinitis Continue allergen avoidance measures directed toward dust mites as listed below Continue flonase  nasal spray 1-2 sprays each nostril once a day as needed for stuffy nose.  In the right nostril, point the applicator out toward the right ear. In the left nostril, point the applicator out toward the left ear Continue levocetirizine 5 mg one tablet once a day as needed for runny nose or itching May use saline nasal rinses as needed For thick post nasal drainage, begin Mucinex  [phone] mg twice a day for about 1 week Consider updating your environmental allergy testing.  Remember to stop antihistamines for 3 days before your skin testing appointment.  Reflux Continue dietary and lifestyle modifications as listed below  Food allergy Continue to avoid shellfish.  In case of an allergic reaction, give Benadryl  50 mg  every 4 hours, and if life-threatening symptoms occur, inject with EpiPen  0.3 mg. Return to the clinic if you are interested in updating your food allergy testing.  Remember to stop antihistamines for 3 days before your food testing appointment.  Call the clinic if this treatment plan is not working well for you  Follow up in 6 months or sooner if needed.   Control of Dust Mite Allergen Dust mites play a major role in allergic asthma and rhinitis. They occur in environments with high humidity wherever  human skin is found. Dust mites absorb humidity from the atmosphere (ie, they do not drink) and feed on organic matter (including shed human and animal skin). Dust mites are a microscopic type of insect that you cannot see with the naked eye. High levels of dust mites have been detected from mattresses, pillows, carpets, upholstered furniture, bed covers, clothes, soft toys and any woven material. The principal allergen of the dust mite is found in its feces. A gram of dust may contain 1,000 mites and 250,000 fecal particles. Mite antigen is easily measured in the air during house cleaning activities. Dust mites do not bite and do not cause harm to humans, other than by triggering allergies/asthma.  Ways to decrease your exposure to dust mites in your home:  1. Encase mattresses, box springs and pillows with a mite-impermeable barrier or cover  2. Wash sheets, blankets and drapes weekly in hot water (130 F) with detergent and dry them in a dryer on the hot setting.  3. Have the room cleaned frequently with a vacuum cleaner and a damp dust-mop. For carpeting or rugs, vacuuming with a vacuum cleaner equipped with a high-efficiency particulate air (HEPA) filter. The dust mite allergic individual should not be in a room which is being cleaned and should wait 1 hour after cleaning before going into the room.  4. Do not sleep on upholstered furniture (eg, couches).  5. If possible removing carpeting, upholstered furniture and drapery from the home is ideal. Horizontal blinds should be eliminated in the rooms  where the person spends the most time (bedroom, study, television room). Washable vinyl, roller-type shades are optimal.  6. Remove all non-washable stuffed toys from the bedroom. Wash stuffed toys weekly like sheets and blankets above.  7. Reduce indoor humidity to less than 50%. Inexpensive humidity monitors can be purchased at most hardware stores. Do not use a humidifier as can make the problem  worse and are not recommended.

## 2024-02-04 ENCOUNTER — Encounter: Payer: Self-pay | Admitting: Family Medicine

## 2024-02-04 ENCOUNTER — Other Ambulatory Visit: Payer: Self-pay

## 2024-02-04 ENCOUNTER — Ambulatory Visit (INDEPENDENT_AMBULATORY_CARE_PROVIDER_SITE_OTHER): Admitting: Family Medicine

## 2024-02-04 ENCOUNTER — Other Ambulatory Visit (HOSPITAL_BASED_OUTPATIENT_CLINIC_OR_DEPARTMENT_OTHER): Payer: Self-pay

## 2024-02-04 VITALS — BP 118/72 | HR 88 | Temp 97.7°F | Resp 20 | Wt 221.0 lb

## 2024-02-04 DIAGNOSIS — T7800XA Anaphylactic reaction due to unspecified food, initial encounter: Secondary | ICD-10-CM

## 2024-02-04 DIAGNOSIS — K219 Gastro-esophageal reflux disease without esophagitis: Secondary | ICD-10-CM

## 2024-02-04 DIAGNOSIS — J4540 Moderate persistent asthma, uncomplicated: Secondary | ICD-10-CM | POA: Diagnosis not present

## 2024-02-04 DIAGNOSIS — T7800XD Anaphylactic reaction due to unspecified food, subsequent encounter: Secondary | ICD-10-CM | POA: Diagnosis not present

## 2024-02-04 DIAGNOSIS — J3089 Other allergic rhinitis: Secondary | ICD-10-CM | POA: Diagnosis not present

## 2024-02-04 MED ORDER — ALBUTEROL SULFATE (2.5 MG/3ML) 0.083% IN NEBU
2.5000 mg | INHALATION_SOLUTION | RESPIRATORY_TRACT | 2 refills | Status: AC | PRN
  Filled 2024-02-04: qty 150, 9d supply, fill #0

## 2024-02-04 MED ORDER — BUDESONIDE-FORMOTEROL FUMARATE 160-4.5 MCG/ACT IN AERO
2.0000 | INHALATION_SPRAY | RESPIRATORY_TRACT | 5 refills | Status: AC | PRN
  Filled 2024-02-04: qty 10.2, 30d supply, fill #0

## 2024-02-06 ENCOUNTER — Ambulatory Visit: Payer: Self-pay | Admitting: Obstetrics and Gynecology

## 2024-02-06 ENCOUNTER — Encounter: Payer: Self-pay | Admitting: Obstetrics and Gynecology

## 2024-02-06 ENCOUNTER — Ambulatory Visit (HOSPITAL_BASED_OUTPATIENT_CLINIC_OR_DEPARTMENT_OTHER)
Admission: RE | Admit: 2024-02-06 | Discharge: 2024-02-06 | Disposition: A | Discharge status: Home / Self Care | Source: Ambulatory Visit | Attending: Obstetrics and Gynecology | Admitting: Obstetrics and Gynecology

## 2024-02-06 DIAGNOSIS — Z780 Asymptomatic menopausal state: Secondary | ICD-10-CM | POA: Diagnosis not present

## 2024-02-06 DIAGNOSIS — M8589 Other specified disorders of bone density and structure, multiple sites: Secondary | ICD-10-CM | POA: Diagnosis not present

## 2024-02-06 DIAGNOSIS — E2839 Other primary ovarian failure: Secondary | ICD-10-CM | POA: Diagnosis present

## 2024-02-06 DIAGNOSIS — M8580 Other specified disorders of bone density and structure, unspecified site: Secondary | ICD-10-CM | POA: Diagnosis not present

## 2024-02-06 DIAGNOSIS — Z01419 Encounter for gynecological examination (general) (routine) without abnormal findings: Secondary | ICD-10-CM | POA: Insufficient documentation

## 2024-02-07 ENCOUNTER — Ambulatory Visit: Payer: Self-pay | Admitting: Family Medicine

## 2024-02-07 LAB — ALLERGEN PROFILE, SHELLFISH
Clam IgE: <0.1 kU/L
F023-IgE Crab: <0.1 kU/L
F080-IgE Lobster: <0.1 kU/L
F290-IgE Oyster: <0.1 kU/L
Scallop IgE: <0.1 kU/L
Shrimp IgE: <0.1 kU/L

## 2024-02-07 NOTE — Progress Notes (Signed)
 Thank you :)

## 2024-02-07 NOTE — Progress Notes (Signed)
 Can you please let this patient know the shellfish lab was negative. If she is interested in moving forward, the next step would be skin testing and if negative an in office food challenge to shellfish. If testing is negative the food challenge can be same day (make a morning appointment). Please help her schedule this if she is interested. Thank you

## 2024-02-11 NOTE — Telephone Encounter (Signed)
 Sent to tem [facility].

## 2024-02-21 ENCOUNTER — Other Ambulatory Visit: Payer: Self-pay

## 2024-02-21 ENCOUNTER — Emergency Department (HOSPITAL_BASED_OUTPATIENT_CLINIC_OR_DEPARTMENT_OTHER)

## 2024-02-21 ENCOUNTER — Other Ambulatory Visit (HOSPITAL_BASED_OUTPATIENT_CLINIC_OR_DEPARTMENT_OTHER): Payer: Self-pay

## 2024-02-21 ENCOUNTER — Encounter (HOSPITAL_BASED_OUTPATIENT_CLINIC_OR_DEPARTMENT_OTHER): Payer: Self-pay | Admitting: Emergency Medicine

## 2024-02-21 ENCOUNTER — Emergency Department (HOSPITAL_BASED_OUTPATIENT_CLINIC_OR_DEPARTMENT_OTHER)
Admission: EM | Admit: 2024-02-21 | Discharge: 2024-02-21 | Disposition: A | Discharge status: Home / Self Care | Attending: Emergency Medicine | Admitting: Emergency Medicine

## 2024-02-21 DIAGNOSIS — M25512 Pain in left shoulder: Secondary | ICD-10-CM | POA: Insufficient documentation

## 2024-02-21 DIAGNOSIS — M1712 Unilateral primary osteoarthritis, left knee: Secondary | ICD-10-CM | POA: Diagnosis not present

## 2024-02-21 DIAGNOSIS — Y9302 Activity, running: Secondary | ICD-10-CM | POA: Diagnosis not present

## 2024-02-21 DIAGNOSIS — M47816 Spondylosis without myelopathy or radiculopathy, lumbar region: Secondary | ICD-10-CM | POA: Diagnosis not present

## 2024-02-21 DIAGNOSIS — M5136 Other intervertebral disc degeneration, lumbar region with discogenic back pain only: Secondary | ICD-10-CM | POA: Diagnosis not present

## 2024-02-21 DIAGNOSIS — M47817 Spondylosis without myelopathy or radiculopathy, lumbosacral region: Secondary | ICD-10-CM | POA: Diagnosis not present

## 2024-02-21 DIAGNOSIS — M79605 Pain in left leg: Secondary | ICD-10-CM | POA: Diagnosis not present

## 2024-02-21 DIAGNOSIS — M25562 Pain in left knee: Secondary | ICD-10-CM | POA: Insufficient documentation

## 2024-02-21 DIAGNOSIS — M549 Dorsalgia, unspecified: Secondary | ICD-10-CM | POA: Insufficient documentation

## 2024-02-21 DIAGNOSIS — W19XXXA Unspecified fall, initial encounter: Secondary | ICD-10-CM | POA: Insufficient documentation

## 2024-02-21 DIAGNOSIS — M5137 Other intervertebral disc degeneration, lumbosacral region with discogenic back pain only: Secondary | ICD-10-CM | POA: Diagnosis not present

## 2024-02-21 MED ORDER — METHOCARBAMOL 500 MG PO TABS
500.0000 mg | ORAL_TABLET | Freq: Two times a day (BID) | ORAL | 0 refills | Status: DC | PRN
  Filled 2024-02-21: qty 20, 10d supply, fill #0

## 2024-02-21 MED ORDER — NAPROXEN 375 MG PO TABS
375.0000 mg | ORAL_TABLET | Freq: Two times a day (BID) | ORAL | 0 refills | Status: DC
  Filled 2024-02-21: qty 20, 10d supply, fill #0

## 2024-02-21 NOTE — Discharge Instructions (Signed)
 ### After a Fall: What to Expect     **You recently had a fall. Here is what to expect and how to care for your shoulder and body as you recover.**      ---      **1. Pain and Stiffness After a Fall**      It is common to feel pain, stiffness, or soreness in the days after a fall, even if X-rays do not show any broken bones or serious injuries. This discomfort usually gets better over several days to a week. Using ice packs, gentle movement, and over-the-counter pain medicine like acetaminophen  or ibuprofen (if safe for you) can help manage these symptoms.[1]      ---      **2. Delayed Onset Muscle Soreness (DOMS)**      You may notice that your muscles feel more sore or stiff a day or two after the fall. This is called delayed onset muscle soreness (DOMS). It happens when muscles or the tissues around them are stressed in a new or intense way, such as during a fall or after unusual activity. DOMS usually peaks 24-72 hours after the event and then gradually improves.[2][3][4]      - **What does DOMS feel like?** Achy, stiff, or tender muscles, sometimes with mild swelling or weakness.      - **How long does it last?** Most people feel better within 3-7 days.      - **What helps?** Gentle movement, light stretching, and using ice or heat packs can help. Massage and certain physical therapies may also reduce soreness. Avoid heavy lifting or intense exercise until the soreness improves.[4]      ---      **3. 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 This means there are small calcium  deposits in one of the tendons that help move your shoulder. This is a common finding and can cause shoulder pain or stiffness, but sometimes it is found even if you do not have much pain.[5][6][7][8]      - **What causes it?** The exact cause is not fully understood, but it is not due to an injury or cancer. It is a buildup of calcium  in the  tendon, often in the rotator cuff.      - **What are the symptoms?** Some people have pain, especially when lifting the arm, while others have no symptoms.      - **Is it dangerous?** No. Most people recover with simple treatments and do not need surgery.[5][6][7][9][10][11][8]      **Treatment and Self-Care:**      - **Rest and activity modification:** Avoid activities that make the pain worse, but try to keep the shoulder moving gently to prevent stiffness.      - **Pain relief:** Over-the-counter pain medicines like acetaminophen  or ibuprofen can help, if you are able to take them safely.[5][6][7]      - **Physical therapy:** Exercises to improve shoulder movement and strength are often helpful.[5][6][7][11][12]      - **Other options:** If pain continues, your doctor may discuss other treatments such as physical therapy, special injections, or procedures to remove the calcium  if needed. Most people improve without surgery.[5][6][7][9][10][11][13][14]      ---      **4. When to Seek Medical Attention**      Contact your healthcare provider if you notice:      - Severe or worsening pain      -  New weakness or numbness in your arm or hand      - Swelling, redness, or warmth in the shoulder      - Trouble moving your arm at all      - Signs of infection (fever, chills)      ---         **Summary**      - Some pain and stiffness after a fall is normal and usually improves in a few days.      - Muscle soreness (DOMS) can peak a day or two after the fall and then gets better.      - Calcific tendinosis is a common, non-dangerous finding in the shoulder and usually improves with simple treatments.      - Keep moving gently, use pain relief as needed, and follow up if symptoms worsen or do not improve.      If you have any questions or concerns, please contact your healthcare provider.

## 2024-02-21 NOTE — ED Triage Notes (Signed)
 Patient reports she was running from large dogs today around 1130 am. Tripped and fell landing on cement porch on left side. Patient c/o left shoulder pain, neck pain, left leg pain, and lower back pain. Patient ambulatory. NAD.

## 2024-02-21 NOTE — ED Notes (Signed)
 Discharge paperwork reviewed entirely with patient, including follow up care. Pain was under control. The patient received instruction and coaching on their prescriptions, and all follow-up questions were answered.  Pt verbalized understanding as well as all parties involved. No questions or concerns voiced at the time of discharge. No acute distress noted. Pt was encouraged to stay adequately hydrated and eat a healthy diet.   Pt was wheeled out to the PVA in a wheelchair without incident.  Pt advised they will notify their PCP immediately. and Pt advised they will seek followup care with a specialist and followup with their PCP.   The pt was instructed to set up and/or review MyChart for their results; and was informed their Providers all have access to the information as well.

## 2024-02-21 NOTE — ED Provider Notes (Signed)
 [facility] [place] Provider Note   CSN: [id number] Arrival date & time: 02/21/24  1253     Patient presents with: Katelyn Fisher is a 73 y.o. female who presents for fall. Patient was visiting her neighbor and talking about smoothies after delivering some peppers from her garden when her neighbor suddenly started screaming because of her 2 large [place] shepherd's that belonged to a neighbor loose in her yard.  Patient reports that Katelyn Fisher turned around rapidly lost her footing on the step and fell onto her left side injuring her left knee thigh back and shoulder.  Katelyn Fisher did not hit her head or lose consciousness in her elderly neighbor helped her back to her feet.  Katelyn Fisher is complaining of soreness in her left knee leg, left shoulder and back.     Fall The problem occurs rarely.       Prior to Admission medications   Medication Sig Start Date End Date Taking? Authorizing Provider  albuterol  (PROAIR  HFA) 108 (90 Base) MCG/ACT inhaler Inhale 2 puffs by mouth into the lungs every 4 (four) hours as needed for coughing or wheezing spells. 07/30/23   Cari Arlean HERO, FNP  albuterol  (PROVENTIL ) (2.5 MG/3ML) 0.083% nebulizer solution Take 3 mLs (2.5 mg total) by nebulization every 4 (four) hours as needed for wheezing or shortness of breath. 02/04/24   Cari Arlean HERO, FNP  amLODipine  (NORVASC ) 5 MG tablet Take 1 tablet (5 mg total) by mouth daily. 11/19/23   Revankar, Jennifer SAUNDERS, MD  atorvastatin  (LIPITOR) 40 MG tablet TAKE 1 TABLET BY MOUTH DAILY 09/17/23   Frann Mabel Mt, DO  benazepril  (LOTENSIN ) 20 MG tablet TAKE 1 TABLET BY MOUTH AT  BEDTIME 09/17/23   Frann Mabel Mt, DO  betamethasone  dipropionate (DIPROLENE ) 0.05 % ointment Apply 1 Application topically daily. 07/09/23     betamethasone  dipropionate (DIPROLENE ) 0.05 % ointment Apply topically to affected area daily as needed. 01/07/24     Betamethasone  Dipropionate POWD 1 application Externally  Once a day prn 30 days 04/11/23     budesonide -formoterol  (SYMBICORT ) 160-4.5 MCG/ACT inhaler Inhale 2 puffs into the lungs as needed. 02/04/24   Cari Arlean HERO, FNP  carvedilol  (COREG ) 12.5 MG tablet TAKE 1 TABLET BY MOUTH TWICE  DAILY WITH A MEAL 09/17/23   Wendling, Mabel Mt, DO  EPINEPHrine  (EPIPEN  2-PAK) 0.3 mg/0.3 mL IJ SOAJ injection Use as directed for severe allergic reaction. 07/30/23   Cari Arlean HERO, FNP  furosemide  (LASIX ) 20 MG tablet TAKE 1 TABLET BY MOUTH DAILY 09/17/23   Frann Mabel Mt, DO  levocetirizine (XYZAL ) 5 MG tablet Take 1 tablet (5 mg total) by mouth every evening. 01/29/23   Cari Arlean HERO, FNP  levothyroxine  (SYNTHROID ) 75 MCG tablet TAKE 1 TABLET (75 MCG TOTAL) BY MOUTH DAILY BEFORE BREAKFAST. Oral for 30    [provider]  montelukast  (SINGULAIR ) 10 MG tablet Take 1 tablet (10 mg total) by mouth at bedtime. 11/04/23   Cari Arlean HERO, FNP  Multiple Vitamin (MULTIVITAMIN) tablet Take 1 tablet by mouth daily.    [provider]  Semaglutide  (RYBELSUS ) 7 MG TABS Take 1 tablet (7 mg total) by mouth daily. 11/04/23   Frann Mabel Mt, DO  traMADol  (ULTRAM ) 50 MG tablet Take 1 tablet (50 mg total) by mouth 3 (three) times daily as needed. 05/06/23     fluticasone  (FLOVENT  HFA) 110 MCG/ACT inhaler Inhale 2 puffs into the lungs in the morning and at  bedtime for 14 days. 07/25/20 10/10/20  Frann Mabel Mt, DO    Allergies: Shellfish allergy, Oxycodone, Penicillins, Iodine, Penicillin g, Iodinated contrast media, and Sulfa antibiotics    Review of Systems  Updated Vital Signs BP (!) 162/78 (BP Location: Left Arm)   Pulse 78   Temp 97.9 F (36.6 C) (Oral)   Resp 17   Ht 5' 1.5 (1.562 m)   Wt 97.5 kg   SpO2 99%   BMI 39.97 kg/m   Physical Exam Vitals and nursing note reviewed.  Constitutional:      General: Katelyn Fisher is not in acute distress.    Appearance: Katelyn Fisher is well-developed. Katelyn Fisher is not diaphoretic.  HENT:     Head: Normocephalic and  atraumatic.     Right Ear: External ear normal.     Left Ear: External ear normal.     Nose: Nose normal.     Mouth/Throat:     Mouth: Mucous membranes are moist.  Eyes:     General: No scleral icterus.    Conjunctiva/sclera: Conjunctivae normal.  Cardiovascular:     Rate and Rhythm: Normal rate and regular rhythm.     Heart sounds: Normal heart sounds. No murmur heard.    No friction rub. No gallop.  Pulmonary:     Effort: Pulmonary effort is normal. No respiratory distress.     Breath sounds: Normal breath sounds.  Abdominal:     General: Bowel sounds are normal. There is no distension.     Palpations: Abdomen is soft. There is no mass.     Tenderness: There is no abdominal tenderness. There is no guarding.  Musculoskeletal:     Cervical back: Normal range of motion.     Comments: Patient has no obvious swelling or deformity.  Katelyn Fisher has limited range of motion of her left shoulder which is chronic and unchanged.  Normal ipsilateral elbow wrist and hand examination.  Katelyn Fisher has some mild paraspinal tenderness of the cervical spine but full range of motion.  No midline spinal tenderness.  Equal grip strength bilaterally upper extremities.  Katelyn Fisher is tender to palpation along her entire left leg without obvious bruising or deformity.  Katelyn Fisher has normal range of motion her foot and ankle.  Katelyn Fisher has some tenderness with movement of the knee but it is without obvious swelling or deformity.  Normal ipsilateral hip examination, no obvious bruising along the leg at this time.  Ligaments are stable in the knee.  Skin:    General: Skin is warm and dry.  Neurological:     Mental Status: Katelyn Fisher is alert and oriented to person, place, and time.     Cranial Nerves: No cranial nerve deficit.  Psychiatric:        Behavior: Behavior normal.     (all labs ordered are listed, but only abnormal results are displayed) Labs Reviewed - No data to display  EKG: None  Radiology: DG Knee Complete 4 Views  Left Result Date: 02/21/2024 CLINICAL DATA:  Leg pain after fall EXAM: LEFT KNEE - COMPLETE 4 VIEW; LEFT TIBIA AND FIBULA - 2 VIEW; LEFT FEMUR 2 VIEWS COMPARISON:  None Available. FINDINGS: There are no findings of fracture or dislocation. No joint effusion. Mild tricompartmental degenerative changes of the knee. Mild subcutaneous soft tissue reticulations of the lower leg. IMPRESSION: 1. No acute fracture or dislocation. 2. Mild subcutaneous soft tissue reticulations of the lower leg, which may represent edema or contusion. Electronically Signed   By: Limin  Xu M.D.  On: 02/21/2024 14:48   DG Femur Min 2 Views Left Result Date: 02/21/2024 CLINICAL DATA:  Leg pain after fall EXAM: LEFT KNEE - COMPLETE 4 VIEW; LEFT TIBIA AND FIBULA - 2 VIEW; LEFT FEMUR 2 VIEWS COMPARISON:  None Available. FINDINGS: There are no findings of fracture or dislocation. No joint effusion. Mild tricompartmental degenerative changes of the knee. Mild subcutaneous soft tissue reticulations of the lower leg. IMPRESSION: 1. No acute fracture or dislocation. 2. Mild subcutaneous soft tissue reticulations of the lower leg, which may represent edema or contusion. Electronically Signed   By: Limin  Xu M.D.   On: 02/21/2024 14:48   DG Tibia/Fibula Left Result Date: 02/21/2024 CLINICAL DATA:  Leg pain after fall EXAM: LEFT KNEE - COMPLETE 4 VIEW; LEFT TIBIA AND FIBULA - 2 VIEW; LEFT FEMUR 2 VIEWS COMPARISON:  None Available. FINDINGS: There are no findings of fracture or dislocation. No joint effusion. Mild tricompartmental degenerative changes of the knee. Mild subcutaneous soft tissue reticulations of the lower leg. IMPRESSION: 1. No acute fracture or dislocation. 2. Mild subcutaneous soft tissue reticulations of the lower leg, which may represent edema or contusion. Electronically Signed   By: Limin  Xu M.D.   On: 02/21/2024 14:48   DG Shoulder Left Result Date: 02/21/2024 CLINICAL DATA:  Left shoulder pain after fall today. EXAM: LEFT  SHOULDER - 2+ VIEW COMPARISON:  None Available. FINDINGS: There is no evidence of fracture or dislocation. There is no evidence of arthropathy. Rounded calcification is seen over greater tuberosity suggesting calcific tendinosis. Soft tissues are unremarkable. IMPRESSION: Possible calcific tendinosis.  No acute abnormality seen. Electronically Signed   By: Lynwood Landy Raddle M.D.   On: 02/21/2024 14:27   DG Lumbar Spine Complete Result Date: 02/21/2024 CLINICAL DATA:  Low back pain after fall today. EXAM: LUMBAR SPINE - COMPLETE 4+ VIEW COMPARISON:  May 11, 2019. FINDINGS: No fracture or significant spondylolisthesis. Minimal degenerative disc disease is noted at L2-3. Moderate degenerative disc disease is noted at L5-S1 hypertrophy of posterior facet joints is seen at L4-5 and L5-S1 secondary to degenerative change. IMPRESSION: Multilevel degenerative changes as noted above. No acute abnormality seen. Electronically Signed   By: Lynwood Landy Raddle M.D.   On: 02/21/2024 14:25     Procedures   Medications Ordered in the ED - No data to display                                  Medical Decision Making Amount and/or Complexity of Data Reviewed Radiology: ordered.  Risk Prescription drug management.   Patient here with complaint of fall.  I have visualized and interpreted images ordered in triage including left knee left shoulder lumbar spine left femur left tib-fib.  There are no acute findings.  I agree with radiologic interpretation.  There is no obvious bruising or deformities and patient is ambulatory.  Discussed supportive care and outpatient follow-up.  Will discharge with low-dose Robaxin  and naproxen .  Discussed outpatient follow-up and return precautions.     Final diagnoses:  Fall, initial encounter    ED Discharge Orders     None          Arloa Chroman, PA-C 02/21/24 1757    Dreama Longs, MD 02/22/24 [phone]

## 2024-02-21 NOTE — ED Notes (Addendum)
 SABRA

## 2024-02-21 NOTE — ED Notes (Signed)
Currently in XR

## 2024-02-24 ENCOUNTER — Other Ambulatory Visit: Payer: Self-pay | Admitting: *Deleted

## 2024-02-24 DIAGNOSIS — M810 Age-related osteoporosis without current pathological fracture: Secondary | ICD-10-CM

## 2024-02-24 MED ORDER — DENOSUMAB 60 MG/ML ~~LOC~~ SOSY: 60.0000 mg | PREFILLED_SYRINGE | Freq: Once | SUBCUTANEOUS | Status: DC

## 2024-03-03 ENCOUNTER — Other Ambulatory Visit (HOSPITAL_BASED_OUTPATIENT_CLINIC_OR_DEPARTMENT_OTHER): Payer: Self-pay

## 2024-03-04 ENCOUNTER — Other Ambulatory Visit (HOSPITAL_BASED_OUTPATIENT_CLINIC_OR_DEPARTMENT_OTHER): Payer: Self-pay

## 2024-03-04 ENCOUNTER — Ambulatory Visit (INDEPENDENT_AMBULATORY_CARE_PROVIDER_SITE_OTHER): Admitting: Family Medicine

## 2024-03-04 ENCOUNTER — Encounter: Payer: Self-pay | Admitting: Family Medicine

## 2024-03-04 VITALS — BP 128/78 | HR 97 | Temp 98.0°F | Resp 16 | Ht 65.0 in | Wt 216.8 lb

## 2024-03-04 DIAGNOSIS — M545 Low back pain, unspecified: Secondary | ICD-10-CM | POA: Diagnosis not present

## 2024-03-04 DIAGNOSIS — M79652 Pain in left thigh: Secondary | ICD-10-CM | POA: Diagnosis not present

## 2024-03-04 DIAGNOSIS — M25512 Pain in left shoulder: Secondary | ICD-10-CM | POA: Diagnosis not present

## 2024-03-04 MED ORDER — METHYLPREDNISOLONE 4 MG PO TBPK
ORAL_TABLET | ORAL | 0 refills | Status: DC
  Filled 2024-03-04: qty 21, 6d supply, fill #0

## 2024-03-04 NOTE — Patient Instructions (Addendum)
 Ice/cold pack over area for 10-15 min twice daily.  Heat (pad or rice pillow in microwave) over affected area, 10-15 minutes twice daily.   OK to take Tylenol  1000 mg (2 extra strength tabs) or 975 mg (3 regular strength tabs) every 6 hours as needed.  Compression/wrap your thigh daily.   Send me a message if you want to see the physical therapy team.   Let us  know if you need anything.  EXERCISES  RANGE OF MOTION (ROM) AND STRETCHING EXERCISES These exercises may help you when beginning to rehabilitate your injury. While completing these exercises, remember:  Restoring tissue flexibility helps normal motion to return to the joints. This allows healthier, less painful movement and activity. An effective stretch should be held for at least 30 seconds. A stretch should never be painful. You should only feel a gentle lengthening or release in the stretched tissue.  ROM - Pendulum Bend at the waist so that your right / left arm falls away from your body. Support yourself with your opposite hand on a solid surface, such as a table or a countertop. Your right / left arm should be perpendicular to the ground. If it is not perpendicular, you need to lean over farther. Relax the muscles in your right / left arm and shoulder as much as possible. Gently sway your hips and trunk so they move your right / left arm without any use of your right / left shoulder muscles. Progress your movements so that your right / left arm moves side to side, then forward and backward, and finally, both clockwise and counterclockwise. Complete 10-15 repetitions in each direction. Many people use this exercise to relieve discomfort in their shoulder as well as to gain range of motion. Repeat 2 times. Complete this exercise 3 times per week.  STRETCH - Flexion, Standing Stand with good posture. With an underhand grip on your right / left hand and an overhand grip on the opposite hand, grasp a broomstick or cane so that  your hands are a little more than shoulder-width apart. Keeping your right / left elbow straight and shoulder muscles relaxed, push the stick with your opposite hand to raise your right / left arm in front of your body and then overhead. Raise your arm until you feel a stretch in your right / left shoulder, but before you have increased shoulder pain. Try to avoid shrugging your right / left shoulder as your arm rises by keeping your shoulder blade tucked down and toward your mid-back spine. Hold 30 seconds. Slowly return to the starting position. Repeat 2 times. Complete this exercise 3 times per week.  STRETCH - Internal Rotation Place your right / left hand behind your back, palm-up. Throw a towel or belt over your opposite shoulder. Grasp the towel/belt with your right / left hand. While keeping an upright posture, gently pull up on the towel/belt until you feel a stretch in the front of your right / left shoulder. Avoid shrugging your right / left shoulder as your arm rises by keeping your shoulder blade tucked down and toward your mid-back spine. Hold 30. Release the stretch by lowering your opposite hand. Repeat 2 times. Complete this exercise 3 times per week.  STRETCH - External Rotation and Abduction Stagger your stance through a doorframe. It does not matter which foot is forward. As instructed by your physician, physical therapist or athletic trainer, place your hands: And forearms above your head and on the door frame. And forearms at  head-height and on the door frame. At elbow-height and on the door frame. Keeping your head and chest upright and your stomach muscles tight to prevent over-extending your low-back, slowly shift your weight onto your front foot until you feel a stretch across your chest and/or in the front of your shoulders. Hold 30 seconds. Shift your weight to your back foot to release the stretch. Repeat 2 times. Complete this stretch 3 times per week.    STRENGTHENING EXERCISES  These exercises may help you when beginning to rehabilitate your injury. They may resolve your symptoms with or without further involvement from your physician, physical therapist or athletic trainer. While completing these exercises, remember:  Muscles can gain both the endurance and the strength needed for everyday activities through controlled exercises. Complete these exercises as instructed by your physician, physical therapist or athletic trainer. Progress the resistance and repetitions only as guided. You may experience muscle soreness or fatigue, but the pain or discomfort you are trying to eliminate should never worsen during these exercises. If this pain does worsen, stop and make certain you are following the directions exactly. If the pain is still present after adjustments, discontinue the exercise until you can discuss the trouble with your clinician. If advised by your physician, during your recovery, avoid activity or exercises which involve actions that place your right / left hand or elbow above your head or behind your back or head. These positions stress the tissues which are trying to heal.  STRENGTH - Scapular Depression and Adduction With good posture, sit on a firm chair. Supported your arms in front of you with pillows, arm rests or a table top. Have your elbows in line with the sides of your body. Gently draw your shoulder blades down and toward your mid-back spine. Gradually increase the tension without tensing the muscles along the top of your shoulders and the back of your neck. Hold for 3 seconds. Slowly release the tension and relax your muscles completely before completing the next repetition. After you have practiced this exercise, remove the arm support and complete it in standing as well as sitting. Repeat 2 times. Complete this exercise 3 times per week.   STRENGTH - External Rotators Secure a rubber exercise band/tubing to a fixed  object so that it is at the same height as your right / left elbow when you are standing or sitting on a firm surface. Stand or sit so that the secured exercise band/tubing is at your side that is not injured. Bend your elbow 90 degrees. Place a folded towel or small pillow under your right / left arm so that your elbow is a few inches away from your side. Keeping the tension on the exercise band/tubing, pull it away from your body, as if pivoting on your elbow. Be sure to keep your body steady so that the movement is only coming from your shoulder rotating. Hold 3 seconds. Release the tension in a controlled manner as you return to the starting position. Repeat 2 times. Complete this exercise 3 times per week.   STRENGTH - Supraspinatus Stand or sit with good posture. Grasp a 2-3 lb weight or an exercise band/tubing so that your hand is thumbs-up, like when you shake hands. Slowly lift your right / left hand from your thigh into the air, traveling about 30 degrees from straight out at your side. Lift your hand to shoulder height or as far as you can without increasing any shoulder pain. Initially, many people do  not lift their hands above shoulder height. Avoid shrugging your right / left shoulder as your arm rises by keeping your shoulder blade tucked down and toward your mid-back spine. Hold for 3 seconds. Control the descent of your hand as you slowly return to your starting position. Repeat 2 times. Complete this exercise 3 times per week.   STRENGTH - Shoulder Extensors Secure a rubber exercise band/tubing so that it is at the height of your shoulders when you are either standing or sitting on a firm arm-less chair. With a thumbs-up grip, grasp an end of the band/tubing in each hand. Straighten your elbows and lift your hands straight in front of you at shoulder height. Step back away from the secured end of band/tubing until it becomes tense. Squeezing your shoulder blades together, pull  your hands down to the sides of your thighs. Do not allow your hands to go behind you. Hold for 3 seconds. Slowly ease the tension on the band/tubing as you reverse the directions and return to the starting position. Repeat 2 times. Complete this exercise 3 times per week.   STRENGTH - Scapular Retractors Secure a rubber exercise band/tubing so that it is at the height of your shoulders when you are either standing or sitting on a firm arm-less chair. With a palm-down grip, grasp an end of the band/tubing in each hand. Straighten your elbows and lift your hands straight in front of you at shoulder height. Step back away from the secured end of band/tubing until it becomes tense. Squeezing your shoulder blades together, draw your elbows back as you bend them. Keep your upper arm lifted away from your body throughout the exercise. Hold 3 seconds. Slowly ease the tension on the band/tubing as you reverse the directions and return to the starting position. Repeat 2 times. Complete this exercise 3 times per week.  STRENGTH - Scapular Depressors Find a sturdy chair without wheels, such as a from a dining room table. Keeping your feet on the floor, lift your bottom from the seat and lock your elbows. Keeping your elbows straight, allow gravity to pull your body weight down. Your shoulders will rise toward your ears. Raise your body against gravity by drawing your shoulder blades down your back, shortening the distance between your shoulders and ears. Although your feet should always maintain contact with the floor, your feet should progressively support less body weight as you get stronger. Hold 3 seconds. In a controlled and slow manner, lower your body weight to begin the next repetition. Repeat 2 times. Complete this exercise 3 times per week.    This information is not intended to replace advice given to you by your health care provider. Make sure you discuss any questions you have with your health  care provider.   Document Released: 05/23/2005 Document Revised: 07/30/2014 Document Reviewed: 10/21/2008 Elsevier Interactive Patient Education [employer]

## 2024-03-04 NOTE — Progress Notes (Signed)
 Chief Complaint  Patient presents with   Follow-up    ER Follow Up    Subjective: Patient is a 73 y.o. female here for ER f/u.  Pt sustained a fall and she went to the ED on 02/21/24. She was running for dogs and fell on her L side. Various X-rays neg for fx/dislocations. Still having pain in the L shoulder and L thigh. Has some bruising on the thigh. She has been elevating the LUE with some relief. She has been using heat/cold packs and stay as active as possible.   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   Anemia 11/07/2011   Anxiety    Arthritis    back   Asymptomatic postmenopausal status 10/01/2008   Qualifier: Diagnosis of   By: Josetta Corrigan      IMO SNOMED Dx Update Oct 2024     Chronic rhinitis 08/21/2021   Current use of beta blocker 06/30/2018   Depression    Diabetes mellitus without complication (HCC)    Endometrial cancer (HCC) 2015   Essential hypertension 08/13/2006   Qualifier: Diagnosis of   By: Nicholaus Channel         Family history of breast cancer    Family history of colon cancer    Family history of ovarian cancer    Family history of prostate cancer    Flank pain 09/14/2019   Gastroesophageal reflux disease without esophagitis 09/14/2010   Qualifier: Diagnosis of   By: Anice CMA, Darlene         GERD (gastroesophageal reflux disease)    H/O total hysterectomy with bilateral salpingo-oophorectomy (BSO) 06/24/2014   Formatting of this note might be different from the original.  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     History of colonic diverticulitis    History of colonic polyps 03/10/2008   Qualifier: Diagnosis of   By: Genie CMA LEODIS), Leisha      IMO SNOMED Dx Update Oct 2024     History of  endometrial cancer 02/03/2018   Hyperlipidemia    Hypertension    Hypothyroidism 06/01/2013   hypothyroid     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   Low back pain radiating to right leg 08/18/2012   Mass of left elbow 09/14/2019   Microcytosis 09/28/2016   Mild intermittent asthma without complication 01/26/2016   Moderate persistent asthma without complication 08/07/2018   Morbid obesity (HCC) 11/09/2014   Obstructive sleep apnea 11/15/2010   Osteoarthritis 11/15/2010   B/l knees     Osteopenia 09/2017   T score -1.2 FRAX 3.2% / 0.3%, 2025 -2.0 frax 10.9, 2.2%   Perennial allergic rhinitis 06/30/2018   Personal history of chemotherapy 2015   Port catheter in place 11/15/2015   Screening Mammogram 11/05/2017   Subacromial impingement of left shoulder 12/30/2018   Thyroid  disease 06/01/2013   Type 2 diabetes mellitus with diabetic neuropathy, without long-term current use of insulin (HCC) 10/25/2016   Type 2 diabetes mellitus with obesity (HCC) 03/12/2011    Objective: BP 128/78 (BP Location: Left Arm, Patient Position: Sitting)   Pulse 97   Temp 98 F (36.7 C) (Oral)   Resp 16   Ht 5' 5 (1.651 m)   Wt 216 lb 12.8 oz (98.3  kg)   SpO2 95%   BMI 36.08 kg/m  General: Awake, appears stated age MSK: TTP over trap on L, top of L shoulder, L anterior thigh, lumbar region b/l; no deformity, edema, excessive warmth Neuro: Gait antalgic, DTR's equal and symmetric throughout, no clonus, no cerebellar signs Lungs: No accessory muscle use Psych: Age appropriate judgment and insight, normal affect and mood  Assessment and Plan: Acute bilateral low back pain without sciatica - Plan: methylPREDNISolone  (MEDROL  DOSEPAK) 4 MG TBPK tablet  Acute pain of left shoulder - Plan: methylPREDNISolone  (MEDROL  DOSEPAK) 4 MG TBPK tablet  Pain of left thigh - Plan: methylPREDNISolone  (MEDROL  DOSEPAK) 4 MG TBPK tablet  Steroids, compression for the thigh. Medrol  Dosepak. Tylenol , Tramadol  prn. Heat,  ice. Stretches/exercises for the shoulder. Send message if she wants PT. ED visit reviewed.  The patient voiced understanding and agreement to the plan.  Mabel [place], DO 03/04/24  11:30 AM

## 2024-03-18 ENCOUNTER — Encounter: Payer: Self-pay | Admitting: Family Medicine

## 2024-03-18 ENCOUNTER — Other Ambulatory Visit: Payer: Self-pay

## 2024-03-18 DIAGNOSIS — M25512 Pain in left shoulder: Secondary | ICD-10-CM

## 2024-03-18 DIAGNOSIS — M79652 Pain in left thigh: Secondary | ICD-10-CM

## 2024-03-18 DIAGNOSIS — M545 Low back pain, unspecified: Secondary | ICD-10-CM

## 2024-03-25 NOTE — Telephone Encounter (Signed)
 Notice of denial of medical coverage for Prolia  injections received from [employer]. Please advise on next steps.   Routing to provider for review.

## 2024-04-06 NOTE — Therapy (Signed)
 OUTPATIENT PHYSICAL THERAPY THORACOLUMBAR EVALUATION   Patient Name: Katelyn Fisher MRN: [id number] DOB:Dec 20, 1950, 73 y.o., female Today's Date: 04/07/2024  END OF SESSION:  PT End of Session - 04/07/24 0932     Visit Number 1    Date for PT Re-Evaluation 06/02/24    PT Start Time 0930    PT Stop Time 1014    PT Time Calculation (min) 44 min    Activity Tolerance Patient tolerated treatment well;No increased pain    Behavior During Therapy [facility] for tasks assessed/performed          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   Anemia 11/07/2011   Anxiety    Arthritis    back   Asymptomatic postmenopausal status 10/01/2008   Qualifier: Diagnosis of   By: Josetta Corrigan      IMO SNOMED Dx Update Oct 2024     Chronic rhinitis 08/21/2021   Current use of beta blocker 06/30/2018   Depression    Diabetes mellitus without complication (HCC)    Endometrial cancer (HCC) 2015   Essential hypertension 08/13/2006   Qualifier: Diagnosis of   By: Nicholaus Channel         Family history of breast cancer    Family history of colon cancer    Family history of ovarian cancer    Family history of prostate cancer    Flank pain 09/14/2019   Gastroesophageal reflux disease without esophagitis 09/14/2010   Qualifier: Diagnosis of   By: Anice CMA, Darlene         GERD (gastroesophageal reflux disease)    H/O total hysterectomy with bilateral salpingo-oophorectomy (BSO) 06/24/2014   Formatting of this note might be different from the original.  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     History of colonic diverticulitis    History of colonic polyps 03/10/2008   Qualifier: Diagnosis of   By: Genie CMA LEODIS), Chick      IMO SNOMED Dx  Update Oct 2024     History of endometrial cancer 02/03/2018   Hyperlipidemia    Hypertension    Hypothyroidism 06/01/2013   hypothyroid     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   Low back pain radiating to right leg 08/18/2012   Mass of left elbow 09/14/2019   Microcytosis 09/28/2016   Mild intermittent asthma without complication 01/26/2016   Moderate persistent asthma without complication 08/07/2018   Morbid obesity (HCC) 11/09/2014   Obstructive sleep apnea 11/15/2010   Osteoarthritis 11/15/2010   B/l knees     Osteopenia 09/2017   T score -1.2 FRAX 3.2% / 0.3%, 2025 -2.0 frax 10.9, 2.2%   Perennial allergic rhinitis 06/30/2018   Personal history of chemotherapy 2015   Port catheter in place 11/15/2015   Screening Mammogram 11/05/2017   Subacromial impingement of left shoulder 12/30/2018   Thyroid  disease 06/01/2013   Type 2 diabetes mellitus with diabetic neuropathy, without long-term current use of insulin (HCC) 10/25/2016   Type 2 diabetes mellitus with obesity (HCC) 03/12/2011   Past Surgical History:  Procedure Laterality Date   ABDOMINAL HYSTERECTOMY  06/23/2014   [facility], TRH/BSO   CHOLECYSTECTOMY  1991   COLONOSCOPY     DILATION AND CURETTAGE OF UTERUS  IR REMOVAL TUN ACCESS W/ PORT W/O FL MOD SED  04/30/2017   POLYPECTOMY     TUBAL LIGATION     Patient Active Problem List   Diagnosis Date Noted   Syncope and collapse 11/19/2023   Dyspnea on exertion 11/19/2023   Palpitations 11/19/2023   Cardiac murmur 11/19/2023   Allergy    Anxiety    Arthritis    Depression    Diabetes mellitus without complication (HCC)    GERD (gastroesophageal reflux disease)    History of colonic diverticulitis    Hypertension    Acute cough 12/19/2022   Chronic rhinitis 08/21/2021   Mass of left elbow 09/14/2019   Flank pain 09/14/2019   Subacromial impingement of left shoulder 12/30/2018   Moderate persistent asthma without complication 08/07/2018   Current use of beta  blocker 06/30/2018   Perennial allergic rhinitis 06/30/2018   Acute bronchitis due to other specified organisms 06/30/2018   History of endometrial cancer 02/03/2018   Screening Mammogram 11/05/2017   Osteopenia 09/2017   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   Type 2 diabetes mellitus with diabetic neuropathy, without long-term current use of insulin (HCC) 10/25/2016   Microcytosis 09/28/2016   Mild intermittent asthma without complication 01/26/2016   Port catheter in place 11/15/2015   Family history of breast cancer    Family history of ovarian cancer    Family history of prostate cancer    Family history of colon cancer    Morbid obesity (HCC) 11/09/2014   H/O total hysterectomy with bilateral salpingo-oophorectomy (BSO) 06/24/2014   Endometrial cancer (HCC) 05/20/2014   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   Personal history of chemotherapy 2015   Hypothyroidism 06/01/2013   Thyroid  disease 06/01/2013   Low back pain radiating to right leg 08/18/2012   Anemia 11/07/2011   Hyperlipidemia 06/15/2011   Type 2 diabetes mellitus with obesity (HCC) 03/12/2011   Acute pain of right knee 01/27/2011   Obstructive sleep apnea 11/15/2010   Osteoarthritis 11/15/2010   Gastroesophageal reflux disease without esophagitis 09/14/2010   DIVERTICULITIS, HX OF 09/14/2010   Asymptomatic postmenopausal status 10/01/2008   History of colonic polyps 03/10/2008   Essential hypertension 08/13/2006    PCP: Frann Mabel Mt, DO   REFERRING PROVIDER: Frann Mabel Mt*   REFERRING DIAG: M54.50 (ICD-10-CM) - Acute bilateral low back pain without sciatica M25.512 (ICD-10-CM) - Acute pain of left shoulder M79.652 (ICD-10-CM) - Pain of left thigh  THERAPY DIAG:  Other low back pain  Pain in right leg  Other abnormalities of gait and mobility  Muscle weakness (generalized)  RATIONALE FOR EVALUATION AND TREATMENT: Rehabilitation  ONSET DATE: 02/21/24  NEXT MD VISIT:     SUBJECTIVE:  SUBJECTIVE STATEMENT: 73 y/o referred to PT from PCP for a mechanical fall on 02/21/24.   Patient reports she was trying to get away from [place] in her neighbor's yard and turned quickly and fell on L side.   Went to the ED and had negative Xrays, but still having L low back and leg pain.   States the pain is intermittent and worse with most all activities.  States pain begins in her low back and radiates down the L lateral hip, thigh and calf all the way to her ankle.  States the pain is stabbing and that she has to keep her leg elevated in sitting positions (such as on a footrest) to relieve her pain.  States can't bend over.  Likes to walk for exercise, work in her flowers, and baking.  States bending over to reach into her lower cabinets hurts.    PAIN: Are you having pain? Yes: NPRS scale: 0/10 now;  6/10 average; 10/10 worst Pain location: low back, R hip/lateral thigh and calf Pain description: stabbing quick pain at times with movements, dull aching otherwise Aggravating factors: Bending over Relieving factors: sitting with leg propped up  PERTINENT HISTORY:  Diabetes, HTN, depression, h/o endometrial CA 10 yrs ago, OA, anemia, obesity  PRECAUTIONS: Fall  RED FLAGS: None  WEIGHT BEARING RESTRICTIONS: No  FALLS:  Has patient fallen in last 6 months? Yes. Number of falls 1  LIVING ENVIRONMENT: Lives with: lives with their family and lives with their spouse Lives in: House/apartment Stairs: Yes: External: 2 steps; yes Has following equipment at home: Single point cane  OCCUPATION: retired [employer] operational officer  PLOF: Independent with gait  PATIENT GOALS: get rid of the pain   OBJECTIVE: (objective measures completed at initial evaluation unless otherwise  dated)  DIAGNOSTIC FINDINGS:  EXAM: LEFT SHOULDER - 2+ VIEW   COMPARISON:  None Available.   FINDINGS: There is no evidence of fracture or dislocation. There is no evidence of arthropathy. Rounded calcification is seen over greater tuberosity suggesting calcific tendinosis. Soft tissues are unremarkable.   IMPRESSION: Possible calcific tendinosis.  No acute abnormality seen.     Electronically Signed   By: Lynwood Landy Raddle M.D.   On: 02/21/2024 14:27  EXAM: LUMBAR SPINE - COMPLETE 4+ VIEW   COMPARISON:  May 11, 2019.   FINDINGS: No fracture or significant spondylolisthesis. Minimal degenerative disc disease is noted at L2-3. Moderate degenerative disc disease is noted at L5-S1 hypertrophy of posterior facet joints is seen at L4-5 and L5-S1 secondary to degenerative change.   IMPRESSION: Multilevel degenerative changes as noted above. No acute abnormality seen.     Electronically Signed   By: Lynwood Landy Raddle M.D.   On: 02/21/2024 14:25EXAM: LEFT KNEE - COMPLETE 4 VIEW; LEFT TIBIA AND FIBULA - 2 VIEW; LEFT FEMUR 2 VIEWS   COMPARISON:  None Available.   FINDINGS: There are no findings of fracture or dislocation. No joint effusion. Mild tricompartmental degenerative changes of the knee. Mild subcutaneous soft tissue reticulations of the lower leg.   IMPRESSION: 1. No acute fracture or dislocation. 2. Mild subcutaneous soft tissue reticulations of the lower leg, which may represent edema or contusion.     Electronically Signed   By: Limin  Xu M.D.   On: 02/21/2024 14:48  10/10/19 - Lumbar MRI IMPRESSION: 1. Facet osteoarthritis at L3-4 and below with L4-5 and L5-S1 anterolisthesis. 2. Variable disc degeneration at L2-3 and below, advanced at L5-S1. 3. Spinal stenosis that is high-grade at  L4-5 and moderate at L5-S1. 4. Moderate foraminal narrowing on the left at L4-5 and bilaterally at L5-S1.10/10/19 - Lumbar MRI IMPRESSION: 1. Facet osteoarthritis at  L3-4 and below with L4-5 and L5-S1 anterolisthesis. 2. Variable disc degeneration at L2-3 and below, advanced at L5-S1. 3. Spinal stenosis that is high-grade at L4-5 and moderate at L5-S1. 4. Moderate foraminal narrowing on the left at L4-5 and bilaterally at L5-S1.  PATIENT SURVEYS:  LEFS = 28/80 ODI = 29 / 50 = 58.0 % QuickDash =  38.6 / 100 = 38.6 %    SCREENING FOR RED FLAGS: Bowel or bladder incontinence: No Spinal tumors: No Cauda equina syndrome: No Compression fracture: No Abdominal aneurysm: No  COGNITION:  Overall cognitive status: Within functional limits for tasks assessed    SENSATION: WFL  POSTURE:  forward head; some R shift, lower L shoulder  PALPATION: TTP over the R paraspinals, SI, and piriformis areas  LUMBAR ROM:   Active  Eval  Flexion To knees;p!  Extension 70%; feels better  Right lateral flexion To knee; some p!  Left lateral flexion To mid thigh; p!  Right rotation 70%; some p!  Left rotation 50%; more p!  (Blank rows = not tested)  MUSCLE LENGTH: Hamstrings: Right SLR 70 deg; Left SLR 40 deg Thomas test: Right NT deg; Left 10 deg Hamstrings: 90/90 is 20 deg on LLE ITB: mild tight L Piriformis: mild tight L Hip flexors: min tightness  LOWER EXTREMITY ROM:     Active  Right eval Left eval  Hip flexion    Hip extension    Hip abduction    Hip adduction    Hip internal rotation    Hip external rotation    Knee flexion 110 97  Knee extension    Ankle dorsiflexion    Ankle plantarflexion    Ankle inversion    Ankle eversion    (Blank rows = not tested)  LOWER EXTREMITY MMT:    MMT Right eval Left eval  Hip flexion 5   Hip extension    Hip abduction 4   Hip adduction    Hip internal rotation 5 4-  Hip external rotation 5 4  Knee flexion 5 4+  Knee extension 5 4  Ankle dorsiflexion 5 5  Ankle plantarflexion 4 3  Ankle inversion    Ankle eversion     (Blank rows = not tested)  LUMBAR SPECIAL TESTS:  Straight leg  raise test: Positive, Slump test: Positive, SI Compression/distraction test: Negative, and FABER test: Negative  FUNCTIONAL TESTS:  TBD  GAIT: Distance walked: into clinic Assistive device utilized: None Level of assistance: Complete Independence Gait pattern: limps on LLE, decreased L weight shift Comments: ***   TODAY'S TREATMENT:  04/07/24 SELF CARE: Provided education on PT POC progression.initial HEP   PATIENT EDUCATION:  Education details: PT eval findings, anticipated POC, and initial HEP  Person educated: Patient Education method: Explanation, Demonstration, Verbal cues, Tactile cues, and Handouts Education comprehension: verbalized understanding, verbal cues required, tactile cues required, and needs further education  HOME EXERCISE PROGRAM: Access Code: [email or web address] URL: [email or web address] Date: 04/07/2024 Prepared by: Garnette Montclair  Exercises - Supine Bridge  - 1 x daily - 7 x weekly - 3 sets - 10 reps - Supine Lower Trunk Rotation  - 1 x daily - 7 x weekly - 3 sets - 10 reps - Supine Piriformis Stretch with Leg Straight  - 1 x daily - 7 x weekly - 1 sets - 1 reps -  1 min hold - Modified Thomas Stretch  - 1 x daily - 7 x weekly - 1 sets - 1 reps - 1 min hold - Lying Prone with 2 Pillows  - 1 x daily - 7 x weekly - 1 sets - 1 reps - 2-3 min hold - Prone on Elbows Stretch  - 1 x daily - 7 x weekly - 1 sets - 10 reps - 5 sec hold   ASSESSMENT:  CLINICAL IMPRESSION: LORAINA STAUFFER is a 73 y.o. female who was referred to physical therapy for evaluation and treatment for LBP, L thigh pain, and L shoulder pain s/p mechanical fall on 02/21/24   Patient reports onset of low back pain and RLE beginning 02/21/24. Pain is worse with bending, twisting, and lifting.  She has been her for PT in the past for LBP w/ LLE radiculopathy and has an old MRI that shows foraminal stenosis.  Patient has deficits in lumbar ROM, L LE flexibility, LLE strength, abnormal  posture, and TTP in the low back and L hip which are interfering with ADLs and are impacting quality of life.  On Modified Oswestry patient scored 29 / 50 = 58.0 %.  Naelani will benefit from skilled PT to address above deficits to improve mobility and activity tolerance with decreased pain interference. Patient is advised that we are going to focus on her LBP and LLE pain since this seems to be her bigger problem rather than the shoulder for now.   If the back pain resolves then we can evaluate her shoulder pain, but we cannot adequately treat these 2 problems at the same time so we will focus on one. Patient is agreeable to this plan.  OBJECTIVE IMPAIRMENTS: Abnormal gait, difficulty walking, decreased ROM, decreased strength, and pain.   ACTIVITY LIMITATIONS: lifting, bending, and locomotion level  PARTICIPATION LIMITATIONS: cleaning and laundry  PERSONAL FACTORS: Age, Fitness, Time since onset of injury/illness/exacerbation, and 3+ comorbidities:   Diabetes, HTN, depression, h/o endometrial CA 10 yrs ago, OA, anemia, obesityare also affecting patient's functional outcome.   REHAB POTENTIAL: Good  CLINICAL DECISION MAKING: Evolving/moderate complexity  EVALUATION COMPLEXITY: Moderate   GOALS: Goals reviewed with patient? Yes  SHORT TERM GOALS: Target date: 05/05/2024     Patient will be independent with initial HEP to improve outcomes and carryover.  Baseline: 100% PT assist required for correct completion Goal status: INITIAL  2.  Patient will report 25% improvement in low back pain to improve QOL. Baseline: 10/10 worst Goal status: INITIAL  LONG TERM GOALS: Target date: 06/02/2024  Patient will be independent with ongoing/advanced HEP for self-management at home.  Baseline: no advanced HEP yet Goal status: INITIAL  2.  Patient will report 50-75% improvement in low back pain to improve QOL.  Baseline: *** Goal status: INITIAL  3.  Patient to demonstrate ability to  achieve and maintain good spinal alignment/posturing and body mechanics needed for daily activities. Baseline: *** Goal status: INITIAL  4.  Patient will demonstrate full pain free lumbar ROM to perform ADLs.   Baseline: Refer to above lumbar ROM table Goal status: INITIAL  5.  Patient will demonstrate improved *** strength to >/= ***/5 for improved stability and ease of mobility. Baseline: Refer to above LE MMT table Goal status: INITIAL  6. Patient will report </= 40% on Modified Oswestry (MCID = 12%) to demonstrate improved functional ability with decreased pain interference. Baseline: 58% Goal status: INITIAL  7.  Patient will tolerate *** min of (standing/sitting/walking)  w/o increased pain to allow for *** improved mobility and activity tolerance. Baseline: *** Goal status: {GOALSTATUS:25110}  8.  Patient will report centralization of radicular symptoms.  Baseline: *** Goal status: {GOALSTATUS:25110}  9.  Patient will ***.  Baseline: *** Goal status: {GOALSTATUS:25110}   10.  *** Baseline: *** Goal status: {GOALSTATUS:25110}    PLAN:  PT FREQUENCY: 1-2x/week  PT DURATION: 8 weeks  PLANNED INTERVENTIONS: 02835- PT Re-evaluation, 97750- Physical Performance Testing, 97110-Therapeutic exercises, 97530- Therapeutic activity, [id number]- Neuromuscular re-education, 97535- Self Care, 02859- Manual therapy, [id number]- Gait training, [phone]- Electrical stimulation (unattended), [id number]- Vasopneumatic device, [id number]- Ultrasound, [id number]- Traction (mechanical), [id number]- Ionotophoresis 4mg /ml Dexamethasone , 79439 (1-2 muscles), 20561 (3+ muscles)- Dry Needling, Patient/Family education, Balance training, Stair training, Taping, Joint mobilization, Spinal mobilization, Cryotherapy, and Moist heat  PLAN FOR NEXT SESSION: PIERRETTE RED SENIOR, PT 04/07/2024, 12:51 PM   Date of referral: *** Referring provider: Wendling  Referring diagnosis? M54.50 (ICD-10-CM) - Acute bilateral low back  pain without sciatica M25.512 (ICD-10-CM) - Acute pain of left shoulder M79.652 (ICD-10-CM) - Pain of left thigh  Treatment diagnosis? (if different than referring diagnosis) R26.89, M54.59, M79.604, M62.81  What was this (referring dx) caused by? Felton Hawks of Condition: Initial Onset (within last 3 months)   Laterality: Lt  Current Functional Measure Score: Back Index modified ODI 29 / 50 = 58.0 %  Objective measurements identify impairments when they are compared to normal values, the uninvolved extremity, and prior level of function.  [x]  Yes  []  No  Objective assessment of functional ability: Moderate functional limitations   Briefly describe symptoms: 73 y/o referred to PT from PCP for a mechanical fall on 02/21/24.   Patient reports she was trying to get away from [place] in her neighbor's yard and turned quickly and fell on L side.   Went to the ED and had negative Xrays, but still having L low back and leg pain.   States the pain is intermittent and worse with most all activities.  States pain begins in her low back and radiates down the L lateral hip, thigh and calf all the way to her ankle.  States the pain is stabbing and that she has to keep her leg elevated in sitting positions (such as on a footrest) to relieve her pain.  States can't bend over.  Likes to walk for exercise, work in her flowers, and baking.  States bending over to reach into her lower cabinets hurts.    How did symptoms start: fall  Average pain intensity:  Last 24 hours: 6/10  Past week: 10/10  How often does the pt experience symptoms? Frequently  How much have the symptoms interfered with usual daily activities? Quite a bit  How has condition changed since care began at this facility? No change  In general, how is the patients overall health? Fair   BACK PAIN (STarT Back Screening Tool) Has pain spread down the leg(s) at some time in the last 2 weeks? yes Has there been pain in the  shoulder or neck at some time in the last 2 weeks? Yes Has the pt only walked short distances because of back pain? yes Has patient dressed more slowly because of back pain in the past 2 weeks? yes Does patient think it's not safe for a person with this condition to be physically active? no Does patient have worrying thoughts a lot of the time? no Does patient feel back pain is terrible and will never get any  better? no Has patient stopped enjoying things they usually enjoy? no

## 2024-04-07 ENCOUNTER — Other Ambulatory Visit (HOSPITAL_BASED_OUTPATIENT_CLINIC_OR_DEPARTMENT_OTHER): Payer: Self-pay

## 2024-04-07 ENCOUNTER — Ambulatory Visit: Attending: Family Medicine | Admitting: Rehabilitation

## 2024-04-07 ENCOUNTER — Other Ambulatory Visit: Payer: Self-pay

## 2024-04-07 DIAGNOSIS — M6281 Muscle weakness (generalized): Secondary | ICD-10-CM | POA: Insufficient documentation

## 2024-04-07 DIAGNOSIS — M5416 Radiculopathy, lumbar region: Secondary | ICD-10-CM | POA: Diagnosis not present

## 2024-04-07 DIAGNOSIS — M25512 Pain in left shoulder: Secondary | ICD-10-CM | POA: Insufficient documentation

## 2024-04-07 DIAGNOSIS — R2689 Other abnormalities of gait and mobility: Secondary | ICD-10-CM | POA: Diagnosis not present

## 2024-04-07 DIAGNOSIS — M79604 Pain in right leg: Secondary | ICD-10-CM | POA: Insufficient documentation

## 2024-04-07 DIAGNOSIS — M5459 Other low back pain: Secondary | ICD-10-CM | POA: Insufficient documentation

## 2024-04-07 DIAGNOSIS — M545 Low back pain, unspecified: Secondary | ICD-10-CM | POA: Insufficient documentation

## 2024-04-07 DIAGNOSIS — M79652 Pain in left thigh: Secondary | ICD-10-CM | POA: Diagnosis not present

## 2024-04-07 DIAGNOSIS — R262 Difficulty in walking, not elsewhere classified: Secondary | ICD-10-CM | POA: Insufficient documentation

## 2024-04-09 ENCOUNTER — Ambulatory Visit: Admitting: Rehabilitation

## 2024-04-09 DIAGNOSIS — M6281 Muscle weakness (generalized): Secondary | ICD-10-CM | POA: Diagnosis not present

## 2024-04-09 DIAGNOSIS — M5416 Radiculopathy, lumbar region: Secondary | ICD-10-CM | POA: Diagnosis not present

## 2024-04-09 DIAGNOSIS — M79652 Pain in left thigh: Secondary | ICD-10-CM | POA: Diagnosis not present

## 2024-04-09 DIAGNOSIS — M5459 Other low back pain: Secondary | ICD-10-CM

## 2024-04-09 DIAGNOSIS — R2689 Other abnormalities of gait and mobility: Secondary | ICD-10-CM | POA: Diagnosis not present

## 2024-04-09 DIAGNOSIS — M79604 Pain in right leg: Secondary | ICD-10-CM | POA: Diagnosis not present

## 2024-04-09 DIAGNOSIS — R262 Difficulty in walking, not elsewhere classified: Secondary | ICD-10-CM | POA: Diagnosis not present

## 2024-04-09 DIAGNOSIS — M25512 Pain in left shoulder: Secondary | ICD-10-CM | POA: Diagnosis not present

## 2024-04-09 DIAGNOSIS — M545 Low back pain, unspecified: Secondary | ICD-10-CM | POA: Diagnosis not present

## 2024-04-09 NOTE — Therapy (Signed)
 OUTPATIENT PHYSICAL THERAPY THORACOLUMBAR EVALUATION   Patient Name: Katelyn Fisher MRN: [id number] DOB:Jul 28, 1950, 73 y.o., female Today's Date: 04/09/2024  END OF SESSION:  PT End of Session - 04/09/24 1453     Visit Number 2    Date for Recertification  06/02/24    PT Start Time 1448    PT Stop Time 1530    PT Time Calculation (min) 42 min    Activity Tolerance Patient tolerated treatment well;No increased pain    Behavior During Therapy [facility] for tasks assessed/performed          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   Anemia 11/07/2011   Anxiety    Arthritis    back   Asymptomatic postmenopausal status 10/01/2008   Qualifier: Diagnosis of   By: Josetta Corrigan      IMO SNOMED Dx Update Oct 2024     Chronic rhinitis 08/21/2021   Current use of beta blocker 06/30/2018   Depression    Diabetes mellitus without complication [facility])    Endometrial cancer (HCC) 2015   Essential hypertension 08/13/2006   Qualifier: Diagnosis of   By: Nicholaus Channel         Family history of breast cancer    Family history of colon cancer    Family history of ovarian cancer    Family history of prostate cancer    Flank pain 09/14/2019   Gastroesophageal reflux disease without esophagitis 09/14/2010   Qualifier: Diagnosis of   By: Anice CMA, Darlene         GERD (gastroesophageal reflux disease)    H/O total hysterectomy with bilateral salpingo-oophorectomy (BSO) 06/24/2014   Formatting of this note might be different from the original.  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     History of colonic diverticulitis    History of colonic polyps 03/10/2008   Qualifier: Diagnosis of   By: Genie CMA LEODIS), Chick      IMO SNOMED Dx  Update Oct 2024     History of endometrial cancer 02/03/2018   Hyperlipidemia    Hypertension    Hypothyroidism 06/01/2013   hypothyroid     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   Low back pain radiating to right leg 08/18/2012   Mass of left elbow 09/14/2019   Microcytosis 09/28/2016   Mild intermittent asthma without complication 01/26/2016   Moderate persistent asthma without complication 08/07/2018   Morbid obesity (HCC) 11/09/2014   Obstructive sleep apnea 11/15/2010   Osteoarthritis 11/15/2010   B/l knees     Osteopenia 09/2017   T score -1.2 FRAX 3.2% / 0.3%, 2025 -2.0 frax 10.9, 2.2%   Perennial allergic rhinitis 06/30/2018   Personal history of chemotherapy 2015   Port catheter in place 11/15/2015   Screening Mammogram 11/05/2017   Subacromial impingement of left shoulder 12/30/2018   Thyroid  disease 06/01/2013   Type 2 diabetes mellitus with diabetic neuropathy, without long-term current use of insulin (HCC) 10/25/2016   Type 2 diabetes mellitus with obesity (HCC) 03/12/2011   Past Surgical History:  Procedure Laterality Date   ABDOMINAL HYSTERECTOMY  06/23/2014   [facility], TRH/BSO   CHOLECYSTECTOMY  1991   COLONOSCOPY     DILATION AND CURETTAGE OF UTERUS  IR REMOVAL TUN ACCESS W/ PORT W/O FL MOD SED  04/30/2017   POLYPECTOMY     TUBAL LIGATION     Patient Active Problem List   Diagnosis Date Noted   Syncope and collapse 11/19/2023   Dyspnea on exertion 11/19/2023   Palpitations 11/19/2023   Cardiac murmur 11/19/2023   Allergy    Anxiety    Arthritis    Depression    Diabetes mellitus without complication (HCC)    GERD (gastroesophageal reflux disease)    History of colonic diverticulitis    Hypertension    Acute cough 12/19/2022   Chronic rhinitis 08/21/2021   Mass of left elbow 09/14/2019   Flank pain 09/14/2019   Subacromial impingement of left shoulder 12/30/2018   Moderate persistent asthma without complication 08/07/2018   Current use of beta  blocker 06/30/2018   Perennial allergic rhinitis 06/30/2018   Acute bronchitis due to other specified organisms 06/30/2018   History of endometrial cancer 02/03/2018   Screening Mammogram 11/05/2017   Osteopenia 09/2017   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   Type 2 diabetes mellitus with diabetic neuropathy, without long-term current use of insulin (HCC) 10/25/2016   Microcytosis 09/28/2016   Mild intermittent asthma without complication 01/26/2016   Port catheter in place 11/15/2015   Family history of breast cancer    Family history of ovarian cancer    Family history of prostate cancer    Family history of colon cancer    Morbid obesity (HCC) 11/09/2014   H/O total hysterectomy with bilateral salpingo-oophorectomy (BSO) 06/24/2014   Endometrial cancer (HCC) 05/20/2014   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   Personal history of chemotherapy 2015   Hypothyroidism 06/01/2013   Thyroid  disease 06/01/2013   Low back pain radiating to right leg 08/18/2012   Anemia 11/07/2011   Hyperlipidemia 06/15/2011   Type 2 diabetes mellitus with obesity (HCC) 03/12/2011   Acute pain of right knee 01/27/2011   Obstructive sleep apnea 11/15/2010   Osteoarthritis 11/15/2010   Gastroesophageal reflux disease without esophagitis 09/14/2010   DIVERTICULITIS, HX OF 09/14/2010   Asymptomatic postmenopausal status 10/01/2008   History of colonic polyps 03/10/2008   Essential hypertension 08/13/2006    PCP: Frann Mabel Mt, DO   REFERRING PROVIDER: Frann Mabel Mt*   REFERRING DIAG: M54.50 (ICD-10-CM) - Acute bilateral low back pain without sciatica M25.512 (ICD-10-CM) - Acute pain of left shoulder M79.652 (ICD-10-CM) - Pain of left thigh  THERAPY DIAG:  Other low back pain  Pain in right leg  Other abnormalities of gait and mobility  Muscle weakness (generalized)  Radiculopathy, lumbar region  Difficulty in walking, not elsewhere classified  RATIONALE FOR  EVALUATION AND TREATMENT: Rehabilitation  ONSET DATE: 02/21/24  NEXT MD VISIT:    SUBJECTIVE:  SUBJECTIVE STATEMENT: 73 y/o referred to PT from PCP for a mechanical fall on 02/21/24.   Patient reports she was trying to get away from [place] in her neighbor's yard and turned quickly and fell on L side.   Went to the ED and had negative Xrays, but still having L low back and leg pain.   States the pain is intermittent and worse with most all activities.  States pain begins in her low back and radiates down the L lateral hip, thigh and calf all the way to her ankle.  States the pain is stabbing and that she has to keep her leg elevated in sitting positions (such as on a footrest) to relieve her pain.  States can't bend over.  Likes to walk for exercise, work in her flowers, and baking.  States bending over to reach into her lower cabinets hurts.    PAIN: Are you having pain? Yes: NPRS scale: 0/10 now;  6/10 average; 10/10 worst Pain location: low back, R hip/lateral thigh and calf Pain description: stabbing quick pain at times with movements, dull aching otherwise Aggravating factors: Bending over Relieving factors: sitting with leg propped up  PERTINENT HISTORY:  Diabetes, HTN, depression, h/o endometrial CA 10 yrs ago, OA, anemia, obesity  PRECAUTIONS: Fall  RED FLAGS: None  WEIGHT BEARING RESTRICTIONS: No  FALLS:  Has patient fallen in last 6 months? Yes. Number of falls 1  LIVING ENVIRONMENT: Lives with: lives with their family and lives with their spouse Lives in: House/apartment Stairs: Yes: External: 2 steps; yes Has following equipment at home: Single point cane  OCCUPATION: retired [employer] operational officer  PLOF: Independent with gait  PATIENT GOALS: get rid of the  pain   OBJECTIVE: (objective measures completed at initial evaluation unless otherwise dated)  DIAGNOSTIC FINDINGS:  EXAM: LEFT SHOULDER - 2+ VIEW   COMPARISON:  None Available.   FINDINGS: There is no evidence of fracture or dislocation. There is no evidence of arthropathy. Rounded calcification is seen over greater tuberosity suggesting calcific tendinosis. Soft tissues are unremarkable.   IMPRESSION: Possible calcific tendinosis.  No acute abnormality seen.     Electronically Signed   By: Lynwood Landy Raddle M.D.   On: 02/21/2024 14:27  EXAM: LUMBAR SPINE - COMPLETE 4+ VIEW   COMPARISON:  May 11, 2019.   FINDINGS: No fracture or significant spondylolisthesis. Minimal degenerative disc disease is noted at L2-3. Moderate degenerative disc disease is noted at L5-S1 hypertrophy of posterior facet joints is seen at L4-5 and L5-S1 secondary to degenerative change.   IMPRESSION: Multilevel degenerative changes as noted above. No acute abnormality seen.     Electronically Signed   By: Lynwood Landy Raddle M.D.   On: 02/21/2024 14:25EXAM: LEFT KNEE - COMPLETE 4 VIEW; LEFT TIBIA AND FIBULA - 2 VIEW; LEFT FEMUR 2 VIEWS   COMPARISON:  None Available.   FINDINGS: There are no findings of fracture or dislocation. No joint effusion. Mild tricompartmental degenerative changes of the knee. Mild subcutaneous soft tissue reticulations of the lower leg.   IMPRESSION: 1. No acute fracture or dislocation. 2. Mild subcutaneous soft tissue reticulations of the lower leg, which may represent edema or contusion.     Electronically Signed   By: Limin  Xu M.D.   On: 02/21/2024 14:48  10/10/19 - Lumbar MRI IMPRESSION: 1. Facet osteoarthritis at L3-4 and below with L4-5 and L5-S1 anterolisthesis. 2. Variable disc degeneration at L2-3 and below, advanced at L5-S1. 3. Spinal stenosis that is high-grade at  L4-5 and moderate at L5-S1. 4. Moderate foraminal narrowing on the left at  L4-5 and bilaterally at L5-S1.10/10/19 - Lumbar MRI IMPRESSION: 1. Facet osteoarthritis at L3-4 and below with L4-5 and L5-S1 anterolisthesis. 2. Variable disc degeneration at L2-3 and below, advanced at L5-S1. 3. Spinal stenosis that is high-grade at L4-5 and moderate at L5-S1. 4. Moderate foraminal narrowing on the left at L4-5 and bilaterally at L5-S1.  PATIENT SURVEYS:  LEFS = 28/80 ODI = 29 / 50 = 58.0 % QuickDash =  38.6 / 100 = 38.6 %    SCREENING FOR RED FLAGS: Bowel or bladder incontinence: No Spinal tumors: No Cauda equina syndrome: No Compression fracture: No Abdominal aneurysm: No  COGNITION:  Overall cognitive status: Within functional limits for tasks assessed    SENSATION: WFL  POSTURE:  forward head; some R shift, lower L shoulder  PALPATION: TTP over the R paraspinals, SI, and piriformis areas  LUMBAR ROM:   Active  Eval  Flexion To knees;p!  Extension 70%; feels better  Right lateral flexion To knee; some p!  Left lateral flexion To mid thigh; p!  Right rotation 70%; some p!  Left rotation 50%; more p!  (Blank rows = not tested)  MUSCLE LENGTH: Hamstrings: Right SLR 70 deg; Left SLR 40 deg Thomas test: Right NT deg; Left 10 deg Hamstrings: 90/90 is 20 deg on LLE ITB: mild tight L Piriformis: mild tight L Hip flexors: min tightness  LOWER EXTREMITY ROM:     Active  Right eval Left eval  Hip flexion    Hip extension    Hip abduction    Hip adduction    Hip internal rotation    Hip external rotation    Knee flexion 110 97  Knee extension    Ankle dorsiflexion    Ankle plantarflexion    Ankle inversion    Ankle eversion    (Blank rows = not tested)  LOWER EXTREMITY MMT:    MMT Right eval Left eval  Hip flexion 5   Hip extension    Hip abduction 4   Hip adduction    Hip internal rotation 5 4-  Hip external rotation 5 4  Knee flexion 5 4+  Knee extension 5 4  Ankle dorsiflexion 5 5  Ankle plantarflexion 4 3  Ankle inversion     Ankle eversion     (Blank rows = not tested)  LUMBAR SPECIAL TESTS:  Straight leg raise test: Positive, Slump test: Positive, SI Compression/distraction test: Negative, and FABER test: Negative  FUNCTIONAL TESTS:  TBD  GAIT: Distance walked: into clinic Assistive device utilized: None Level of assistance: Complete Independence Gait pattern: limps on LLE, decreased L weight shift Comments:    TODAY'S TREATMENT:  04/09/24 THERAPEUTIC EXERCISE: To improve strength, endurance, and ROM.  Demonstration, verbal and tactile cues throughout for technique. NuStep L5 x 6'  THERAPEUTIC ACTIVITIES: To improve functional performance.  Demonstration, verbal and tactile cues throughout for technique. SKTC RLE x 10 Bridge x 20 BLE LTR x 20 BLE Prone lying over 2 pillows with MFR foam roll and manual x 10' Prone over 2 pillow press up x 10 Checked leg length and RLE is slightly longer Gait in clinic demonstrates increased hip ER and toe out on the LLE and fairly straight RLE  NEUROMUSCULAR RE-EDUCATION: To improve balance, posture, and proprioception. Prone over 2 pillows modified plank from knees x 2/10 Quadruped cat/cow x 20 Quadruped knee extension x 10 BLE Quadruped marching with hands x  10 BUE    04/07/24 SELF CARE: Provided education on PT POC progression.initial HEP   PATIENT EDUCATION:  Education details: HEP review and HEP update  Person educated: Patient Education method: Explanation, Demonstration, Verbal cues, Tactile cues, and Handouts Education comprehension: verbalized understanding, verbal cues required, tactile cues required, and needs further education  HOME EXERCISE PROGRAM: Access Code: [email or web address] URL: [email or web address] Date: 04/09/2024 Prepared by: Garnette Montclair  Exercises - Supine Bridge  - 1 x daily - 7 x weekly - 3 sets - 10 reps - Supine Lower Trunk Rotation  - 1 x daily - 7 x weekly - 3 sets - 10 reps - Supine Piriformis Stretch  with Leg Straight  - 1 x daily - 7 x weekly - 1 sets - 1 reps - 1 min hold - Modified Thomas Stretch  - 1 x daily - 7 x weekly - 1 sets - 1 reps - 1 min hold - Lying Prone with 2 Pillows  - 1 x daily - 7 x weekly - 1 sets - 1 reps - 2-3 min hold - Prone Press Up  - 1 x daily - 7 x weekly - 3 sets - 10 reps - Plank on Knees  - 1 x daily - 7 x weekly - 2 sets - 10 reps - Cat Cow  - 1 x daily - 7 x weekly - 3 sets - 10 reps - Quadruped Alternating Leg Extensions  - 1 x daily - 7 x weekly - 3 sets - 10 reps  ASSESSMENT:  CLINICAL IMPRESSION: Patient is able to advance with prone and quadruped exercises with focus on extension.   She has difficulty with prone and quadruped hip extension with lumbar stability and control of balance exhibiting core and lumbar weakness.  Advised patient to avoid flexion for now.   Added foam roll deep tissue massage for paraspinals for pain relief.  PT remains necessary for ROM, strength, pain deficits.    EVAL: Katelyn Fisher is a 73 y.o. female who was referred to physical therapy for evaluation and treatment for LBP, L thigh pain, and L shoulder pain s/p mechanical fall on 02/21/24   Patient reports onset of low back pain and RLE beginning 02/21/24. Pain is worse with bending, twisting, and lifting.  She has been her for PT in the past for LBP w/ LLE radiculopathy and has an old MRI that shows foraminal stenosis.  Patient has deficits in lumbar ROM, L LE flexibility, LLE strength, abnormal posture, and TTP in the low back and L hip which are interfering with ADLs and are impacting quality of life.  On Modified Oswestry patient scored 29 / 50 = 58.0 %.  Camyla will benefit from skilled PT to address above deficits to improve mobility and activity tolerance with decreased pain interference. Patient is advised that we are going to focus on her LBP and LLE pain since this seems to be her bigger problem rather than the shoulder for now.   If the back pain resolves then we can  evaluate her shoulder pain, but we cannot adequately treat these 2 problems at the same time so we will focus on one. Patient is agreeable to this plan.  OBJECTIVE IMPAIRMENTS: Abnormal gait, difficulty walking, decreased ROM, decreased strength, and pain.   ACTIVITY LIMITATIONS: lifting, bending, and locomotion level  PARTICIPATION LIMITATIONS: cleaning and laundry  PERSONAL FACTORS: Age, Fitness, Time since onset of injury/illness/exacerbation, and 3+ comorbidities:   Diabetes, HTN, depression, h/o endometrial CA 10 yrs  ago, OA, anemia, obesityare also affecting patient's functional outcome.   REHAB POTENTIAL: Good  CLINICAL DECISION MAKING: Evolving/moderate complexity  EVALUATION COMPLEXITY: Moderate   GOALS: Goals reviewed with patient? Yes  SHORT TERM GOALS: Target date: 05/05/2024     Patient will be independent with initial HEP to improve outcomes and carryover.  Baseline: 100% PT assist required for correct completion Goal status: INITIAL  2.  Patient will report 25% improvement in low back pain to improve QOL. Baseline: 10/10 worst Goal status: INITIAL  LONG TERM GOALS: Target date: 06/02/2024  Patient will be independent with ongoing/advanced HEP for self-management at home.  Baseline: no advanced HEP yet Goal status: INITIAL  2.  Patient will report 50-75% improvement in low back pain to improve QOL.  Baseline: 10/10 WORST Goal status: INITIAL  3.  Patient to demonstrate ability to achieve and maintain good spinal alignment/posturing and body mechanics needed for daily activities. Baseline: difficulty with PPT in standing with core contraction--shaky abdominals Goal status: INITIAL  4.  Patient will demonstrate full pain free lumbar ROM to perform ADLs.   Baseline: Refer to above lumbar ROM table Goal status: INITIAL  5.  Patient will demonstrate improved BLE strength to >/= 5/5 for improved stability and ease of mobility. Baseline: Refer to above LE MMT  table Goal status: INITIAL  6. Patient will report </= 40% on Modified Oswestry (MCID = 12%) to demonstrate improved functional ability with decreased pain interference. Baseline: 58% Goal status: INITIAL   PLAN:  PT FREQUENCY: 1-2x/week  PT DURATION: 8 weeks  PLANNED INTERVENTIONS: 97164- PT Re-evaluation, 97750- Physical Performance Testing, 97110-Therapeutic exercises, 97530- Therapeutic activity, [id number]- Neuromuscular re-education, 97535- Self Care, 02859- Manual therapy, [id number]- Gait training, [phone]- Electrical stimulation (unattended), 97016- Vasopneumatic device, [id number]- Ultrasound, [id number]- Traction (mechanical), [id number]- Ionotophoresis 4mg /ml Dexamethasone , 79439 (1-2 muscles), 20561 (3+ muscles)- Dry Needling, Patient/Family education, Balance training, Stair training, Taping, Joint mobilization, Spinal mobilization, Cryotherapy, and Moist heat  PLAN FOR NEXT SESSION: Progress lumbar ROM, strength, stabilization    Yuval Nolet, PT 04/09/2024, 5:28 PM   Date of referral: see referral Referring provider: [place]  Referring diagnosis? M54.50 (ICD-10-CM) - Acute bilateral low back pain without sciatica M25.512 (ICD-10-CM) - Acute pain of left shoulder M79.652 (ICD-10-CM) - Pain of left thigh  Treatment diagnosis? (if different than referring diagnosis) R26.89, M54.59, M79.604, M62.81  What was this (referring dx) caused by? Felton Hawks of Condition: Initial Onset (within last 3 months)   Laterality: Lt  Current Functional Measure Score: Back Index modified ODI 29 / 50 = 58.0 %  Objective measurements identify impairments when they are compared to normal values, the uninvolved extremity, and prior level of function.  [x]  Yes  []  No  Objective assessment of functional ability: Moderate functional limitations   Briefly describe symptoms: 73 y/o referred to PT from PCP for a mechanical fall on 02/21/24.   Patient reports she was trying to get away from [place] in  her neighbor's yard and turned quickly and fell on L side.   Went to the ED and had negative Xrays, but still having L low back and leg pain.   States the pain is intermittent and worse with most all activities.  States pain begins in her low back and radiates down the L lateral hip, thigh and calf all the way to her ankle.  States the pain is stabbing and that she has to keep her leg elevated in sitting positions (such as on a footrest) to relieve her  pain.  States can't bend over.  Likes to walk for exercise, work in her flowers, and baking.  States bending over to reach into her lower cabinets hurts.    How did symptoms start: fall  Average pain intensity:  Last 24 hours: 6/10  Past week: 10/10  How often does the pt experience symptoms? Frequently  How much have the symptoms interfered with usual daily activities? Quite a bit  How has condition changed since care began at this facility? No change  In general, how is the patients overall health? Fair   BACK PAIN (STarT Back Screening Tool) Has pain spread down the leg(s) at some time in the last 2 weeks? yes Has there been pain in the shoulder or neck at some time in the last 2 weeks? Yes Has the pt only walked short distances because of back pain? yes Has patient dressed more slowly because of back pain in the past 2 weeks? yes Does patient think it's not safe for a person with this condition to be physically active? no Does patient have worrying thoughts a lot of the time? no Does patient feel back pain is terrible and will never get any better? no Has patient stopped enjoying things they usually enjoy? no

## 2024-04-13 ENCOUNTER — Ambulatory Visit: Payer: Self-pay | Admitting: Family Medicine

## 2024-04-13 ENCOUNTER — Ambulatory Visit: Admitting: Family Medicine

## 2024-04-13 ENCOUNTER — Encounter: Payer: Self-pay | Admitting: Family Medicine

## 2024-04-13 ENCOUNTER — Other Ambulatory Visit (HOSPITAL_BASED_OUTPATIENT_CLINIC_OR_DEPARTMENT_OTHER): Payer: Self-pay

## 2024-04-13 VITALS — BP 140/96 | HR 100 | Temp 98.1°F | Resp 18 | Ht 65.0 in | Wt 224.0 lb

## 2024-04-13 DIAGNOSIS — E1169 Type 2 diabetes mellitus with other specified complication: Secondary | ICD-10-CM | POA: Diagnosis not present

## 2024-04-13 DIAGNOSIS — I10 Essential (primary) hypertension: Secondary | ICD-10-CM | POA: Diagnosis not present

## 2024-04-13 DIAGNOSIS — E039 Hypothyroidism, unspecified: Secondary | ICD-10-CM | POA: Diagnosis not present

## 2024-04-13 DIAGNOSIS — E669 Obesity, unspecified: Secondary | ICD-10-CM | POA: Diagnosis not present

## 2024-04-13 LAB — LIPID PANEL
Cholesterol: 154 mg/dL (ref 0–200)
HDL: 60.9 mg/dL (ref >39.00)
LDL Cholesterol: 71 mg/dL (ref 0–99)
NonHDL: 92.7
Total CHOL/HDL Ratio: 3
Triglycerides: 110 mg/dL (ref 0.0–149.0)
VLDL: 22 mg/dL (ref 0.0–40.0)

## 2024-04-13 LAB — COMPREHENSIVE METABOLIC PANEL WITH GFR
ALT: 17 U/L (ref 0–35)
AST: 15 U/L (ref 0–37)
Albumin: 3.9 g/dL (ref 3.5–5.2)
Alkaline Phosphatase: 92 U/L (ref 39–117)
BUN: 12 mg/dL (ref 6–23)
CO2: 31 meq/L (ref 19–32)
Calcium: 9.3 mg/dL (ref 8.4–10.5)
Chloride: 106 meq/L (ref 96–112)
Creatinine, Ser: 0.78 mg/dL (ref 0.40–1.20)
GFR: 75.94 mL/min (ref >60.00)
Glucose, Bld: 96 mg/dL (ref 70–99)
Potassium: 4.2 meq/L (ref 3.5–5.1)
Sodium: 142 meq/L (ref 135–145)
Total Bilirubin: 0.4 mg/dL (ref 0.2–1.2)
Total Protein: 6.5 g/dL (ref 6.0–8.3)

## 2024-04-13 LAB — T4, FREE: Free T4: 0.9 ng/dL (ref 0.60–1.60)

## 2024-04-13 LAB — HEMOGLOBIN A1C: Hgb A1c MFr Bld: 6.7 % — ABNORMAL HIGH (ref 4.6–6.5)

## 2024-04-13 LAB — TSH: TSH: 4.2 u[IU]/mL (ref 0.35–5.50)

## 2024-04-13 MED ORDER — RYBELSUS 14 MG PO TABS
14.0000 mg | ORAL_TABLET | Freq: Every day | ORAL | 2 refills | Status: DC
  Filled 2024-04-13: qty 30, 30d supply, fill #0
  Filled 2024-05-18: qty 30, 30d supply, fill #1
  Filled 2024-06-15: qty 30, 30d supply, fill #2

## 2024-04-13 NOTE — Patient Instructions (Signed)
 Give us  2-3 business days to get the results of your labs back.   Keep the diet clean and stay active.  Take 2 tabs of the Rybelsus  until you run out.   Let us  know if you need anything.

## 2024-04-13 NOTE — Progress Notes (Signed)
 Subjective:   Chief Complaint  Patient presents with   Follow-up    6 month     Katelyn Fisher is a 73 y.o. female here for follow-up of diabetes.   Katelyn Fisher does not routinely check her sugars.  Patient does not require insulin.   Medications include: Ryblesus 7 mg/d Diet is healthy.  Exercise: walking  Hypertension Patient presents for hypertension follow up. She does monitor home blood pressures. Blood pressures ranging on average from 120's/70's. She is compliant with medications- Lotensin  20 mg/d, Coreg  12.5 mg bid. Patient has these side effects of medication: none Diet/exercise as above. NO CP or SOB.   Hypothyroidism Patient presents for follow-up of hypothyroidism.  Reports compliance with medication- levothyroxine  75 mcg/d. 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   Anemia 11/07/2011   Anxiety    Arthritis    back   Asymptomatic postmenopausal status 10/01/2008   Qualifier: Diagnosis of   By: Josetta Corrigan      IMO SNOMED Dx Update Oct 2024     Chronic rhinitis 08/21/2021   Current use of beta blocker 06/30/2018   Depression    Diabetes mellitus without complication (HCC)    Endometrial cancer (HCC) 2015   Essential hypertension 08/13/2006   Qualifier: Diagnosis of   By: Nicholaus Channel         Family history of breast cancer    Family history of colon cancer    Family history of ovarian cancer    Family history of prostate cancer    Flank pain 09/14/2019   Gastroesophageal reflux disease without esophagitis 09/14/2010   Qualifier: Diagnosis of   By: Anice CMA, Darlene          GERD (gastroesophageal reflux disease)    H/O total hysterectomy with bilateral salpingo-oophorectomy (BSO) 06/24/2014   Formatting of this note might be different from the original.  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     History of colonic diverticulitis    History of colonic polyps 03/10/2008   Qualifier: Diagnosis of   By: Genie CMA LEODIS), Chick      IMO SNOMED Dx Update Oct 2024     History of endometrial cancer 02/03/2018   Hyperlipidemia    Hypertension    Hypothyroidism 06/01/2013   hypothyroid     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   Low back pain radiating to right leg 08/18/2012   Mass of left elbow 09/14/2019   Microcytosis 09/28/2016   Mild intermittent asthma without complication 01/26/2016   Moderate persistent asthma without complication 08/07/2018   Morbid obesity (HCC) 11/09/2014   Obstructive sleep apnea 11/15/2010   Osteoarthritis 11/15/2010   B/l knees     Osteopenia 09/2017   T score -1.2 FRAX 3.2% / 0.3%, 2025 -2.0 frax 10.9, 2.2%   Perennial allergic rhinitis 06/30/2018   Personal history of chemotherapy 2015   Port catheter in place 11/15/2015   Screening Mammogram 11/05/2017   Subacromial impingement of left shoulder 12/30/2018   Thyroid  disease 06/01/2013   Type 2 diabetes mellitus with diabetic neuropathy, without long-term current use of insulin (HCC) 10/25/2016  Type 2 diabetes mellitus with obesity (HCC) 03/12/2011     Related testing: Retinal exam: Done Pneumovax: done  Objective:  BP (!) 140/96   Pulse 100   Temp 98.1 F (36.7 C)   Resp 18   Ht 5' 5 (1.651 m)   Wt 224 lb (101.6 kg)   SpO2 98%   BMI 37.28 kg/m  General:  Well developed, well nourished, in no apparent distress Lungs:  CTAB, no access msc use Cardio:  RRR, no bruits, no LE edema Psych: Age appropriate judgment and insight  Assessment:   Type 2 diabetes mellitus with obesity (HCC) - Plan: Semaglutide   (RYBELSUS ) 14 MG TABS, Comprehensive metabolic panel with GFR, Lipid panel, Hemoglobin A1c  Essential hypertension  Hypothyroidism, unspecified type - Plan: TSH, T4, free   Plan:   Chronic, stable. Increase Rybelsus  to 14 mg/d. Counseled on diet and exercise. Chronic, stable.  She did not take her blood pressure medicine today.  I informed her that she can do this in the future without affecting her blood work results.  Cont Lotensin  20 mg/d, Coreg  12.5 mg bid. Chronic, stable. Cont levothyroxine  75 mcg/d.  F/u in 6 mo. The patient voiced understanding and agreement to the plan.  Katelyn Mt Sylvarena, DO 04/13/24 7:56 AM

## 2024-04-15 ENCOUNTER — Ambulatory Visit

## 2024-04-15 DIAGNOSIS — M79604 Pain in right leg: Secondary | ICD-10-CM | POA: Diagnosis not present

## 2024-04-15 DIAGNOSIS — R2689 Other abnormalities of gait and mobility: Secondary | ICD-10-CM | POA: Diagnosis not present

## 2024-04-15 DIAGNOSIS — R262 Difficulty in walking, not elsewhere classified: Secondary | ICD-10-CM | POA: Diagnosis not present

## 2024-04-15 DIAGNOSIS — M79652 Pain in left thigh: Secondary | ICD-10-CM | POA: Diagnosis not present

## 2024-04-15 DIAGNOSIS — M5459 Other low back pain: Secondary | ICD-10-CM | POA: Diagnosis not present

## 2024-04-15 DIAGNOSIS — M545 Low back pain, unspecified: Secondary | ICD-10-CM | POA: Diagnosis not present

## 2024-04-15 DIAGNOSIS — M5416 Radiculopathy, lumbar region: Secondary | ICD-10-CM

## 2024-04-15 DIAGNOSIS — M6281 Muscle weakness (generalized): Secondary | ICD-10-CM | POA: Diagnosis not present

## 2024-04-15 DIAGNOSIS — M25512 Pain in left shoulder: Secondary | ICD-10-CM | POA: Diagnosis not present

## 2024-04-15 NOTE — Therapy (Signed)
 OUTPATIENT PHYSICAL THERAPY THORACOLUMBAR TREATMENT   Patient Name: Katelyn Fisher MRN: [id number] DOB:March 01, 1951, 73 y.o., female Today's Date: 04/15/2024  END OF SESSION:  PT End of Session - 04/15/24 1117     Visit Number 3    Date for Recertification  06/02/24    PT Start Time 1104    PT Stop Time 1151    PT Time Calculation (min) 47 min    Activity Tolerance Patient tolerated treatment well;No increased pain    Behavior During Therapy [facility] for tasks assessed/performed           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   Anemia 11/07/2011   Anxiety    Arthritis    back   Asymptomatic postmenopausal status 10/01/2008   Qualifier: Diagnosis of   By: Josetta Corrigan      IMO SNOMED Dx Update Oct 2024     Chronic rhinitis 08/21/2021   Current use of beta blocker 06/30/2018   Depression    Diabetes mellitus without complication (HCC)    Endometrial cancer (HCC) 2015   Essential hypertension 08/13/2006   Qualifier: Diagnosis of   By: Nicholaus Channel         Family history of breast cancer    Family history of colon cancer    Family history of ovarian cancer    Family history of prostate cancer    Flank pain 09/14/2019   Gastroesophageal reflux disease without esophagitis 09/14/2010   Qualifier: Diagnosis of   By: Anice CMA, Darlene         GERD (gastroesophageal reflux disease)    H/O total hysterectomy with bilateral salpingo-oophorectomy (BSO) 06/24/2014   Formatting of this note might be different from the original.  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     History of colonic diverticulitis    History of colonic polyps 03/10/2008   Qualifier: Diagnosis of   By: Genie CMA LEODIS), Chick      IMO SNOMED Dx  Update Oct 2024     History of endometrial cancer 02/03/2018   Hyperlipidemia    Hypertension    Hypothyroidism 06/01/2013   hypothyroid     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   Low back pain radiating to right leg 08/18/2012   Mass of left elbow 09/14/2019   Microcytosis 09/28/2016   Mild intermittent asthma without complication 01/26/2016   Moderate persistent asthma without complication 08/07/2018   Morbid obesity (HCC) 11/09/2014   Obstructive sleep apnea 11/15/2010   Osteoarthritis 11/15/2010   B/l knees     Osteopenia 09/2017   T score -1.2 FRAX 3.2% / 0.3%, 2025 -2.0 frax 10.9, 2.2%   Perennial allergic rhinitis 06/30/2018   Personal history of chemotherapy 2015   Port catheter in place 11/15/2015   Screening Mammogram 11/05/2017   Subacromial impingement of left shoulder 12/30/2018   Thyroid  disease 06/01/2013   Type 2 diabetes mellitus with diabetic neuropathy, without long-term current use of insulin (HCC) 10/25/2016   Type 2 diabetes mellitus with obesity 03/12/2011   Past Surgical History:  Procedure Laterality Date   ABDOMINAL HYSTERECTOMY  06/23/2014   [facility], TRH/BSO   CHOLECYSTECTOMY  1991   COLONOSCOPY     DILATION AND CURETTAGE OF UTERUS  IR REMOVAL TUN ACCESS W/ PORT W/O FL MOD SED  04/30/2017   POLYPECTOMY     TUBAL LIGATION     Patient Active Problem List   Diagnosis Date Noted   Syncope and collapse 11/19/2023   Dyspnea on exertion 11/19/2023   Palpitations 11/19/2023   Cardiac murmur 11/19/2023   Allergy    Anxiety    Arthritis    Depression    Diabetes mellitus without complication (HCC)    GERD (gastroesophageal reflux disease)    History of colonic diverticulitis    Hypertension    Acute cough 12/19/2022   Chronic rhinitis 08/21/2021   Mass of left elbow 09/14/2019   Flank pain 09/14/2019   Subacromial impingement of left shoulder 12/30/2018   Moderate persistent asthma without complication 08/07/2018   Current use of beta blocker  06/30/2018   Perennial allergic rhinitis 06/30/2018   Acute bronchitis due to other specified organisms 06/30/2018   History of endometrial cancer 02/03/2018   Screening Mammogram 11/05/2017   Osteopenia 09/2017   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   Type 2 diabetes mellitus with diabetic neuropathy, without long-term current use of insulin (HCC) 10/25/2016   Microcytosis 09/28/2016   Mild intermittent asthma without complication 01/26/2016   Port catheter in place 11/15/2015   Family history of breast cancer    Family history of ovarian cancer    Family history of prostate cancer    Family history of colon cancer    Morbid obesity (HCC) 11/09/2014   H/O total hysterectomy with bilateral salpingo-oophorectomy (BSO) 06/24/2014   Endometrial cancer (HCC) 05/20/2014   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   Personal history of chemotherapy 2015   Hypothyroidism 06/01/2013   Thyroid  disease 06/01/2013   Low back pain radiating to right leg 08/18/2012   Anemia 11/07/2011   Hyperlipidemia 06/15/2011   Type 2 diabetes mellitus with obesity 03/12/2011   Acute pain of right knee 01/27/2011   Obstructive sleep apnea 11/15/2010   Osteoarthritis 11/15/2010   Gastroesophageal reflux disease without esophagitis 09/14/2010   DIVERTICULITIS, HX OF 09/14/2010   Asymptomatic postmenopausal status 10/01/2008   History of colonic polyps 03/10/2008   Essential hypertension 08/13/2006    PCP: Frann Mabel Mt, DO   REFERRING PROVIDER: Frann Mabel Mt*   REFERRING DIAG: M54.50 (ICD-10-CM) - Acute bilateral low back pain without sciatica M25.512 (ICD-10-CM) - Acute pain of left shoulder M79.652 (ICD-10-CM) - Pain of left thigh  THERAPY DIAG:  Other low back pain  Pain in right leg  Other abnormalities of gait and mobility  Muscle weakness (generalized)  Radiculopathy, lumbar region  Difficulty in walking, not elsewhere classified  RATIONALE FOR EVALUATION AND  TREATMENT: Rehabilitation  ONSET DATE: 02/21/24  NEXT MD VISIT:    SUBJECTIVE:  SUBJECTIVE STATEMENT: Pt arrives noting more R side low back pain, L leg slightly painful right now    PAIN: Are you having pain? Yes: NPRS scale:  6/10 R low back, 3/10 L leg Pain location: low back, R hip/lateral thigh and calf Pain description: stabbing quick pain at times with movements, dull aching otherwise Aggravating factors: Bending over Relieving factors: sitting with leg propped up  PERTINENT HISTORY:  Diabetes, HTN, depression, h/o endometrial CA 10 yrs ago, OA, anemia, obesity  PRECAUTIONS: Fall  RED FLAGS: None  WEIGHT BEARING RESTRICTIONS: No  FALLS:  Has patient fallen in last 6 months? Yes. Number of falls 1  LIVING ENVIRONMENT: Lives with: lives with their family and lives with their spouse Lives in: House/apartment Stairs: Yes: External: 2 steps; yes Has following equipment at home: Single point cane  OCCUPATION: retired [employer] operational officer  PLOF: Independent with gait  PATIENT GOALS: get rid of the pain   OBJECTIVE: (objective measures completed at initial evaluation unless otherwise dated)  DIAGNOSTIC FINDINGS:  EXAM: LEFT SHOULDER - 2+ VIEW   COMPARISON:  None Available.   FINDINGS: There is no evidence of fracture or dislocation. There is no evidence of arthropathy. Rounded calcification is seen over greater tuberosity suggesting calcific tendinosis. Soft tissues are unremarkable.   IMPRESSION: Possible calcific tendinosis.  No acute abnormality seen.     Electronically Signed   By: Lynwood Landy Raddle M.D.   On: 02/21/2024 14:27  EXAM: LUMBAR SPINE - COMPLETE 4+ VIEW   COMPARISON:  May 11, 2019.   FINDINGS: No fracture or significant spondylolisthesis.  Minimal degenerative disc disease is noted at L2-3. Moderate degenerative disc disease is noted at L5-S1 hypertrophy of posterior facet joints is seen at L4-5 and L5-S1 secondary to degenerative change.   IMPRESSION: Multilevel degenerative changes as noted above. No acute abnormality seen.     Electronically Signed   By: Lynwood Landy Raddle M.D.   On: 02/21/2024 14:25EXAM: LEFT KNEE - COMPLETE 4 VIEW; LEFT TIBIA AND FIBULA - 2 VIEW; LEFT FEMUR 2 VIEWS   COMPARISON:  None Available.   FINDINGS: There are no findings of fracture or dislocation. No joint effusion. Mild tricompartmental degenerative changes of the knee. Mild subcutaneous soft tissue reticulations of the lower leg.   IMPRESSION: 1. No acute fracture or dislocation. 2. Mild subcutaneous soft tissue reticulations of the lower leg, which may represent edema or contusion.     Electronically Signed   By: Limin  Xu M.D.   On: 02/21/2024 14:48  10/10/19 - Lumbar MRI IMPRESSION: 1. Facet osteoarthritis at L3-4 and below with L4-5 and L5-S1 anterolisthesis. 2. Variable disc degeneration at L2-3 and below, advanced at L5-S1. 3. Spinal stenosis that is high-grade at L4-5 and moderate at L5-S1. 4. Moderate foraminal narrowing on the left at L4-5 and bilaterally at L5-S1.10/10/19 - Lumbar MRI IMPRESSION: 1. Facet osteoarthritis at L3-4 and below with L4-5 and L5-S1 anterolisthesis. 2. Variable disc degeneration at L2-3 and below, advanced at L5-S1. 3. Spinal stenosis that is high-grade at L4-5 and moderate at L5-S1. 4. Moderate foraminal narrowing on the left at L4-5 and bilaterally at L5-S1.  PATIENT SURVEYS:  LEFS = 28/80 ODI = 29 / 50 = 58.0 % QuickDash =  38.6 / 100 = 38.6 %    SCREENING FOR RED FLAGS: Bowel or bladder incontinence: No Spinal tumors: No Cauda equina syndrome: No Compression fracture: No Abdominal aneurysm: No  COGNITION:  Overall cognitive status: Within functional limits for tasks  assessed  SENSATION: WFL  POSTURE:  forward head; some R shift, lower L shoulder  PALPATION: TTP over the R paraspinals, SI, and piriformis areas  LUMBAR ROM:   Active  Eval  Flexion To knees;p!  Extension 70%; feels better  Right lateral flexion To knee; some p!  Left lateral flexion To mid thigh; p!  Right rotation 70%; some p!  Left rotation 50%; more p!  (Blank rows = not tested)  MUSCLE LENGTH: Hamstrings: Right SLR 70 deg; Left SLR 40 deg Thomas test: Right NT deg; Left 10 deg Hamstrings: 90/90 is 20 deg on LLE ITB: mild tight L Piriformis: mild tight L Hip flexors: min tightness  LOWER EXTREMITY ROM:     Active  Right eval Left eval  Hip flexion    Hip extension    Hip abduction    Hip adduction    Hip internal rotation    Hip external rotation    Knee flexion 110 97  Knee extension    Ankle dorsiflexion    Ankle plantarflexion    Ankle inversion    Ankle eversion    (Blank rows = not tested)  LOWER EXTREMITY MMT:    MMT Right eval Left eval  Hip flexion 5   Hip extension    Hip abduction 4   Hip adduction    Hip internal rotation 5 4-  Hip external rotation 5 4  Knee flexion 5 4+  Knee extension 5 4  Ankle dorsiflexion 5 5  Ankle plantarflexion 4 3  Ankle inversion    Ankle eversion     (Blank rows = not tested)  LUMBAR SPECIAL TESTS:  Straight leg raise test: Positive, Slump test: Positive, SI Compression/distraction test: Negative, and FABER test: Negative  FUNCTIONAL TESTS:  TBD  GAIT: Distance walked: into clinic Assistive device utilized: None Level of assistance: Complete Independence Gait pattern: limps on LLE, decreased L weight shift Comments:    TODAY'S TREATMENT:  04/15/24 NEUROMUSCULAR RE-EDUCATION: To improve coordination, kinesthesia, posture, and proprioception.  Quadruped cat/cow x 10 B Quadruped hip extension x 15 B Quadruped x 10 fire hydrants B- more difficult LLE, keeps foot on table Supine ab sets  10x5 Supine march + TrA 10x5 Bridge with TrA x 20  THERAPEUTIC EXERCISE: To improve strength, endurance, ROM, and flexibility.  Nustep L4x9min Seated pball rollout SB each way x 10 Seated R/L QL stretch x 30'  MANUAL THERAPY: To promote normalized muscle tension, improve joint mobility and/or for pain modulation  STM to R lumbar paraspinals, QL closer to origin on iliac crest  04/09/24 THERAPEUTIC EXERCISE: To improve strength, endurance, and ROM.  Demonstration, verbal and tactile cues throughout for technique. NuStep L5 x 6'  THERAPEUTIC ACTIVITIES: To improve functional performance.  Demonstration, verbal and tactile cues throughout for technique. SKTC RLE x 10 Bridge x 20 BLE LTR x 20 BLE Prone lying over 2 pillows with MFR foam roll and manual x 10' Prone over 2 pillow press up x 10 Checked leg length and RLE is slightly longer Gait in clinic demonstrates increased hip ER and toe out on the LLE and fairly straight RLE  NEUROMUSCULAR RE-EDUCATION: To improve balance, posture, and proprioception. Prone over 2 pillows modified plank from knees x 2/10 Quadruped cat/cow x 20 Quadruped knee extension x 10 BLE Quadruped marching with hands x 10 BUE    04/07/24 SELF CARE: Provided education on PT POC progression.initial HEP   PATIENT EDUCATION:  Education details: HEP review and HEP update  Person educated:  Patient Education method: Explanation, Demonstration, Verbal cues, Tactile cues, and Handouts Education comprehension: verbalized understanding, verbal cues required, tactile cues required, and needs further education  HOME EXERCISE PROGRAM: Access Code: [email or web address] URL: [email or web address] Date: 04/09/2024 Prepared by: Garnette Montclair  Exercises - Supine Bridge  - 1 x daily - 7 x weekly - 3 sets - 10 reps - Supine Lower Trunk Rotation  - 1 x daily - 7 x weekly - 3 sets - 10 reps - Supine Piriformis Stretch with Leg Straight  - 1 x daily - 7 x weekly  - 1 sets - 1 reps - 1 min hold - Modified Thomas Stretch  - 1 x daily - 7 x weekly - 1 sets - 1 reps - 1 min hold - Lying Prone with 2 Pillows  - 1 x daily - 7 x weekly - 1 sets - 1 reps - 2-3 min hold - Prone Press Up  - 1 x daily - 7 x weekly - 3 sets - 10 reps - Plank on Knees  - 1 x daily - 7 x weekly - 2 sets - 10 reps - Cat Cow  - 1 x daily - 7 x weekly - 3 sets - 10 reps - Quadruped Alternating Leg Extensions  - 1 x daily - 7 x weekly - 3 sets - 10 reps  ASSESSMENT:  CLINICAL IMPRESSION: Advanced core stabilization, lumbar stretching and manual techniques to reduce pain, improve stability, and reduce soft tissue restrictions. Pt responded well to treatment.  Notes 35% improvement in pain, progressing thus far.  EVAL: Katelyn Fisher is a 73 y.o. female who was referred to physical therapy for evaluation and treatment for LBP, L thigh pain, and L shoulder pain s/p mechanical fall on 02/21/24   Patient reports onset of low back pain and RLE beginning 02/21/24. Pain is worse with bending, twisting, and lifting.  She has been her for PT in the past for LBP w/ LLE radiculopathy and has an old MRI that shows foraminal stenosis.  Patient has deficits in lumbar ROM, L LE flexibility, LLE strength, abnormal posture, and TTP in the low back and L hip which are interfering with ADLs and are impacting quality of life.  On Modified Oswestry patient scored 29 / 50 = 58.0 %.  Katelyn Fisher will benefit from skilled PT to address above deficits to improve mobility and activity tolerance with decreased pain interference. Patient is advised that we are going to focus on her LBP and LLE pain since this seems to be her bigger problem rather than the shoulder for now.   If the back pain resolves then we can evaluate her shoulder pain, but we cannot adequately treat these 2 problems at the same time so we will focus on one. Patient is agreeable to this plan.  OBJECTIVE IMPAIRMENTS: Abnormal gait, difficulty walking,  decreased ROM, decreased strength, and pain.   ACTIVITY LIMITATIONS: lifting, bending, and locomotion level  PARTICIPATION LIMITATIONS: cleaning and laundry  PERSONAL FACTORS: Age, Fitness, Time since onset of injury/illness/exacerbation, and 3+ comorbidities:   Diabetes, HTN, depression, h/o endometrial CA 10 yrs ago, OA, anemia, obesityare also affecting patient's functional outcome.   REHAB POTENTIAL: Good  CLINICAL DECISION MAKING: Evolving/moderate complexity  EVALUATION COMPLEXITY: Moderate   GOALS: Goals reviewed with patient? Yes  SHORT TERM GOALS: Target date: 05/05/2024     Patient will be independent with initial HEP to improve outcomes and carryover.  Baseline: 100% PT assist required for correct completion Goal status: INITIAL  2.  Patient will report 25% improvement in low back pain to improve QOL. Baseline: 10/10 worst Goal status: MET- 04/15/24  LONG TERM GOALS: Target date: 06/02/2024  Patient will be independent with ongoing/advanced HEP for self-management at home.  Baseline: no advanced HEP yet Goal status: INITIAL  2.  Patient will report 50-75% improvement in low back pain to improve QOL.  Baseline: 10/10 WORST Goal status: INITIAL  3.  Patient to demonstrate ability to achieve and maintain good spinal alignment/posturing and body mechanics needed for daily activities. Baseline: difficulty with PPT in standing with core contraction--shaky abdominals Goal status: INITIAL  4.  Patient will demonstrate full pain free lumbar ROM to perform ADLs.   Baseline: Refer to above lumbar ROM table Goal status: INITIAL  5.  Patient will demonstrate improved BLE strength to >/= 5/5 for improved stability and ease of mobility. Baseline: Refer to above LE MMT table Goal status: INITIAL  6. Patient will report </= 40% on Modified Oswestry (MCID = 12%) to demonstrate improved functional ability with decreased pain interference. Baseline: 58% Goal status:  INITIAL   PLAN:  PT FREQUENCY: 1-2x/week  PT DURATION: 8 weeks  PLANNED INTERVENTIONS: 97164- PT Re-evaluation, 97750- Physical Performance Testing, 97110-Therapeutic exercises, 97530- Therapeutic activity, [id number]- Neuromuscular re-education, 97535- Self Care, 02859- Manual therapy, [phone]- Gait training, [phone]- Electrical stimulation (unattended), 97016- Vasopneumatic device, [id number]- Ultrasound, [id number]- Traction (mechanical), [id number]- Ionotophoresis 4mg /ml Dexamethasone , 79439 (1-2 muscles), 20561 (3+ muscles)- Dry Needling, Patient/Family education, Balance training, Stair training, Taping, Joint mobilization, Spinal mobilization, Cryotherapy, and Moist heat  PLAN FOR NEXT SESSION: Progress lumbar ROM, strength, stabilization    Geet Hosking LITTIE Gaskins, PTA 04/15/2024, 11:56 AM   Date of referral: see referral Referring provider: [place]  Referring diagnosis? M54.50 (ICD-10-CM) - Acute bilateral low back pain without sciatica M25.512 (ICD-10-CM) - Acute pain of left shoulder M79.652 (ICD-10-CM) - Pain of left thigh  Treatment diagnosis? (if different than referring diagnosis) R26.89, M54.59, M79.604, M62.81  What was this (referring dx) caused by? Felton Hawks of Condition: Initial Onset (within last 3 months)   Laterality: Lt  Current Functional Measure Score: Back Index modified ODI 29 / 50 = 58.0 %  Objective measurements identify impairments when they are compared to normal values, the uninvolved extremity, and prior level of function.  [x]  Yes  []  No  Objective assessment of functional ability: Moderate functional limitations   Briefly describe symptoms: 73 y/o referred to PT from PCP for a mechanical fall on 02/21/24.   Patient reports she was trying to get away from [place] in her neighbor's yard and turned quickly and fell on L side.   Went to the ED and had negative Xrays, but still having L low back and leg pain.   States the pain is intermittent and worse with most all  activities.  States pain begins in her low back and radiates down the L lateral hip, thigh and calf all the way to her ankle.  States the pain is stabbing and that she has to keep her leg elevated in sitting positions (such as on a footrest) to relieve her pain.  States can't bend over.  Likes to walk for exercise, work in her flowers, and baking.  States bending over to reach into her lower cabinets hurts.    How did symptoms start: fall  Average pain intensity:  Last 24 hours: 6/10  Past week: 10/10  How often does the pt experience symptoms? Frequently  How much have the symptoms  interfered with usual daily activities? Quite a bit  How has condition changed since care began at this facility? No change  In general, how is the patients overall health? Fair   BACK PAIN (STarT Back Screening Tool) Has pain spread down the leg(s) at some time in the last 2 weeks? yes Has there been pain in the shoulder or neck at some time in the last 2 weeks? Yes Has the pt only walked short distances because of back pain? yes Has patient dressed more slowly because of back pain in the past 2 weeks? yes Does patient think it's not safe for a person with this condition to be physically active? no Does patient have worrying thoughts a lot of the time? no Does patient feel back pain is terrible and will never get any better? no Has patient stopped enjoying things they usually enjoy? no

## 2024-04-16 ENCOUNTER — Ambulatory Visit (HOSPITAL_BASED_OUTPATIENT_CLINIC_OR_DEPARTMENT_OTHER)
Admission: RE | Admit: 2024-04-16 | Discharge: 2024-04-16 | Disposition: A | Discharge status: Home / Self Care | Source: Ambulatory Visit | Attending: Physician Assistant | Admitting: Physician Assistant

## 2024-04-16 ENCOUNTER — Ambulatory Visit
Admission: EM | Admit: 2024-04-16 | Discharge: 2024-04-16 | Disposition: A | Discharge status: Home / Self Care | Attending: Physician Assistant | Admitting: Physician Assistant

## 2024-04-16 ENCOUNTER — Ambulatory Visit: Payer: Self-pay

## 2024-04-16 ENCOUNTER — Ambulatory Visit: Payer: Self-pay | Admitting: Physician Assistant

## 2024-04-16 ENCOUNTER — Other Ambulatory Visit: Payer: Self-pay

## 2024-04-16 ENCOUNTER — Encounter: Payer: Self-pay | Admitting: Family Medicine

## 2024-04-16 DIAGNOSIS — M79652 Pain in left thigh: Secondary | ICD-10-CM

## 2024-04-16 DIAGNOSIS — M7122 Synovial cyst of popliteal space [Baker], left knee: Secondary | ICD-10-CM | POA: Diagnosis not present

## 2024-04-16 NOTE — ED Notes (Signed)
 Pt to go to [facility] for ultrasound. Pt verbalized understanding.

## 2024-04-16 NOTE — ED Triage Notes (Signed)
 Pt presents with a chief complaint of left thigh pain x 3 months. Currently rates overall pain a 7/10. Pt states she did fall on her left side on 02/21/24. This made her pain worse. Pt does feel like the left leg is more swollen than the right. Ibuprofen + Tramadol  taken. Tramadol  did help with pain. Pt states she does not like taking this medication.

## 2024-04-16 NOTE — ED Notes (Addendum)
 Scheduled pt a 3:15pm appointment today at [facility] for her DVT study.

## 2024-04-16 NOTE — ED Provider Notes (Addendum)
 [facility]    CSN: [id number] Arrival date & time: 04/16/24  1318      History   Chief Complaint Chief Complaint  Patient presents with   Leg Pain    HPI Katelyn Fisher is a 73 y.o. female.  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), Anemia (11/07/2011), Anxiety, Arthritis, Asymptomatic postmenopausal status (10/01/2008), Chronic rhinitis (08/21/2021), Current use of beta blocker (06/30/2018), Depression, Diabetes mellitus without complication (HCC), Endometrial cancer (HCC) (2015), Essential hypertension (08/13/2006), Family history of breast cancer, Family history of colon cancer, Family history of ovarian cancer, Family history of prostate cancer, Flank pain (09/14/2019), Gastroesophageal reflux disease without esophagitis (09/14/2010), GERD (gastroesophageal reflux disease), H/O total hysterectomy with bilateral salpingo-oophorectomy (BSO) (06/24/2014), History of colonic diverticulitis, History of colonic polyps (03/10/2008), History of endometrial cancer (02/03/2018), Hyperlipidemia, Hypertension, Hypothyroidism (06/01/2013), Lipodermatosclerosis (12/07/2013), Low back pain radiating to right leg (08/18/2012), Mass of left elbow (09/14/2019), Microcytosis (09/28/2016), Mild intermittent asthma without complication (01/26/2016), Moderate persistent asthma without complication (08/07/2018), Morbid obesity (HCC) (11/09/2014), Obstructive sleep apnea (11/15/2010), Osteoarthritis (11/15/2010), Osteopenia (09/2017), Perennial allergic rhinitis (06/30/2018), Personal history of chemotherapy (2015), Port catheter in place (11/15/2015), Screening Mammogram (11/05/2017), Subacromial impingement of left shoulder (12/30/2018), Thyroid  disease (06/01/2013), Type 2 diabetes mellitus with diabetic neuropathy,  without long-term current use of insulin (HCC) (10/25/2016), and Type 2 diabetes mellitus with obesity (03/12/2011).   HPI  Discussed the use of AI scribe software for clinical note transcription with the patient, who gave verbal consent to proceed.  The patient presents with thigh pain.  She has been experiencing pain in the anterior thigh since July, which worsened following a fall on August 1st. The pain occasionally radiates into the hip and buttocks and is exacerbated by prolonged standing or bending over. It is persistent and disrupts sleep due to discomfort.  She has used a topical cream for joint pain, similar to Volterin, with minimal relief. She also takes Tylenol  and Tramadol  at night, which provide slight relief and aid in sleep. No bruising, swelling, or color changes in the leg have been noted. There is no history of recent surgery or immobilization. She denies numbness or tingling unless standing for extended periods, at which point it may extend further down the leg.  She has a history of cancer treated with chemotherapy in 2015. She is concerned about the possibility of a blood clot, although she has not experienced shortness of breath or chest pain.   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   Anemia 11/07/2011   Anxiety    Arthritis    back   Asymptomatic postmenopausal status 10/01/2008   Qualifier: Diagnosis of   By: Josetta Corrigan      IMO SNOMED Dx Update Oct 2024     Chronic rhinitis 08/21/2021   Current use of beta blocker 06/30/2018   Depression    Diabetes mellitus without complication [facility])    Endometrial cancer (HCC) 2015   Essential hypertension 08/13/2006   Qualifier: Diagnosis of   By: Nicholaus Channel         Family history of breast cancer     Family  history of colon cancer    Family history of ovarian cancer    Family history of prostate cancer    Flank pain 09/14/2019   Gastroesophageal reflux disease without esophagitis 09/14/2010   Qualifier: Diagnosis of   By: Anice CMA, Darlene         GERD (gastroesophageal reflux disease)    H/O total hysterectomy with bilateral salpingo-oophorectomy (BSO) 06/24/2014   Formatting of this note might be different from the original.  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     History of colonic diverticulitis    History of colonic polyps 03/10/2008   Qualifier: Diagnosis of   By: Genie CMA LEODIS), Chick      IMO SNOMED Dx Update Oct 2024     History of endometrial cancer 02/03/2018   Hyperlipidemia    Hypertension    Hypothyroidism 06/01/2013   hypothyroid     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   Low back pain radiating to right leg 08/18/2012   Mass of left elbow 09/14/2019   Microcytosis 09/28/2016   Mild intermittent asthma without complication 01/26/2016   Moderate persistent asthma without complication 08/07/2018   Morbid obesity (HCC) 11/09/2014   Obstructive sleep apnea 11/15/2010   Osteoarthritis 11/15/2010   B/l knees     Osteopenia 09/2017   T score -1.2 FRAX 3.2% / 0.3%, 2025 -2.0 frax 10.9, 2.2%   Perennial allergic rhinitis 06/30/2018   Personal history of chemotherapy 2015   Port catheter in place 11/15/2015   Screening Mammogram 11/05/2017   Subacromial impingement of left shoulder 12/30/2018   Thyroid  disease 06/01/2013   Type 2 diabetes mellitus with diabetic neuropathy, without long-term current use of insulin (HCC) 10/25/2016   Type 2 diabetes mellitus with obesity 03/12/2011    Patient Active Problem List   Diagnosis Date Noted   Syncope and collapse 11/19/2023   Dyspnea on exertion 11/19/2023   Palpitations 11/19/2023   Cardiac murmur 11/19/2023   Allergy    Anxiety    Arthritis    Depression     Diabetes mellitus without complication (HCC)    GERD (gastroesophageal reflux disease)    History of colonic diverticulitis    Hypertension    Acute cough 12/19/2022   Chronic rhinitis 08/21/2021   Mass of left elbow 09/14/2019   Flank pain 09/14/2019   Subacromial impingement of left shoulder 12/30/2018   Moderate persistent asthma without complication 08/07/2018   Current use of beta blocker 06/30/2018   Perennial allergic rhinitis 06/30/2018   Acute bronchitis due to other specified organisms 06/30/2018   History of endometrial cancer 02/03/2018   Screening Mammogram 11/05/2017   Osteopenia 09/2017   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   Type 2 diabetes mellitus with diabetic neuropathy, without long-term current use of insulin (HCC) 10/25/2016   Microcytosis 09/28/2016   Mild intermittent asthma without complication 01/26/2016   Port catheter in place 11/15/2015   Family history of breast cancer    Family history of ovarian cancer    Family history of prostate cancer    Family history of colon cancer    Morbid obesity (HCC) 11/09/2014   H/O total hysterectomy with bilateral salpingo-oophorectomy (BSO) 06/24/2014   Endometrial cancer (HCC) 05/20/2014   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   Personal history of chemotherapy 2015   Hypothyroidism 06/01/2013   Thyroid  disease 06/01/2013   Low back pain radiating to right leg 08/18/2012   Anemia 11/07/2011   Hyperlipidemia 06/15/2011   Type 2 diabetes  mellitus with obesity 03/12/2011   Acute pain of right knee 01/27/2011   Obstructive sleep apnea 11/15/2010   Osteoarthritis 11/15/2010   Gastroesophageal reflux disease without esophagitis 09/14/2010   DIVERTICULITIS, HX OF 09/14/2010   Asymptomatic postmenopausal status 10/01/2008   History of colonic polyps 03/10/2008   Essential hypertension 08/13/2006    Past Surgical History:  Procedure Laterality Date   ABDOMINAL HYSTERECTOMY  06/23/2014   [facility], TRH/BSO    CHOLECYSTECTOMY  1991   COLONOSCOPY     DILATION AND CURETTAGE OF UTERUS     IR REMOVAL TUN ACCESS W/ PORT W/O FL MOD SED  04/30/2017   POLYPECTOMY     TUBAL LIGATION      OB History     Gravida  2   Para  2   Term  2   Preterm      AB      Living  2      SAB      IAB      Ectopic      Multiple      Live Births           Obstetric Comments  1 adopted child          Home Medications    Prior to Admission medications   Medication Sig Start Date End Date Taking? Authorizing Provider  albuterol  (PROAIR  HFA) 108 (90 Base) MCG/ACT inhaler Inhale 2 puffs by mouth into the lungs every 4 (four) hours as needed for coughing or wheezing spells. 07/30/23   Cari Arlean HERO, FNP  albuterol  (PROVENTIL ) (2.5 MG/3ML) 0.083% nebulizer solution Take 3 mLs (2.5 mg total) by nebulization every 4 (four) hours as needed for wheezing or shortness of breath. 02/04/24   Cari Arlean HERO, FNP  amLODipine  (NORVASC ) 5 MG tablet Take 1 tablet (5 mg total) by mouth daily. 11/19/23   Revankar, Jennifer SAUNDERS, MD  atorvastatin  (LIPITOR) 40 MG tablet TAKE 1 TABLET BY MOUTH DAILY 09/17/23   Frann Mabel Mt, DO  benazepril  (LOTENSIN ) 20 MG tablet TAKE 1 TABLET BY MOUTH AT  BEDTIME 09/17/23   Wendling, Mabel Mt, DO  betamethasone  dipropionate (DIPROLENE ) 0.05 % ointment Apply topically to affected area daily as needed. 01/07/24     Betamethasone  Dipropionate POWD 1 application Externally Once a day prn 30 days 04/11/23     budesonide -formoterol  (SYMBICORT ) 160-4.5 MCG/ACT inhaler Inhale 2 puffs into the lungs as needed. 02/04/24   Cari Arlean HERO, FNP  carvedilol  (COREG ) 12.5 MG tablet TAKE 1 TABLET BY MOUTH TWICE  DAILY WITH A MEAL 09/17/23   Wendling, Mabel Mt, DO  EPINEPHrine  (EPIPEN  2-PAK) 0.3 mg/0.3 mL IJ SOAJ injection Use as directed for severe allergic reaction. 07/30/23   Cari Arlean HERO, FNP  furosemide  (LASIX ) 20 MG tablet TAKE 1 TABLET BY MOUTH DAILY 09/17/23   Frann Mabel Mt, DO   levocetirizine (XYZAL ) 5 MG tablet Take 1 tablet (5 mg total) by mouth every evening. 01/29/23   Ambs, Arlean HERO, FNP  levothyroxine  (SYNTHROID ) 75 MCG tablet TAKE 1 TABLET (75 MCG TOTAL) BY MOUTH DAILY BEFORE BREAKFAST. Oral for 30    [provider]  montelukast  (SINGULAIR ) 10 MG tablet Take 1 tablet (10 mg total) by mouth at bedtime. 11/04/23   Cari Arlean HERO, FNP  Multiple Vitamin (MULTIVITAMIN) tablet Take 1 tablet by mouth daily.    [provider]  Semaglutide  (RYBELSUS ) 14 MG TABS Take 1 tablet (14 mg total) by mouth daily. 04/13/24   Wendling,  Mabel Mt, DO  traMADol  (ULTRAM ) 50 MG tablet Take 1 tablet (50 mg total) by mouth 3 (three) times daily as needed. 05/06/23     fluticasone  (FLOVENT  HFA) 110 MCG/ACT inhaler Inhale 2 puffs into the lungs in the morning and at bedtime for 14 days. 07/25/20 10/10/20  Frann Mabel Mt, DO    Family History Family History  Problem Relation Age of Onset   Diabetes Mother    Lupus Mother    Prostate cancer Father 79   Diabetes Father    Cancer Father        lung cancer, asbestos exposure   Hyperlipidemia Sister    Heart disease Brother    Diabetes Brother    Heart disease Brother    Heart disease Brother    Diabetes Brother    Colon polyps Brother    Heart disease Brother    Diabetes Brother    Colon polyps Brother    Heart disease Maternal Aunt        x 3 -2 brothers   Hypertension Maternal Aunt    Breast cancer Maternal Aunt        maternal half; dx in her 7s   Cancer Maternal Aunt        cervical   Ovarian cancer Maternal Aunt        dx in her 19s   Leukemia Maternal Uncle 21   Cancer Paternal Aunt        NOS- breast    Prostate cancer Paternal Uncle        dx in his 50s   Colon cancer Maternal Grandmother        dx in her 7s   Breast cancer Other        dx in her 43s    Social History Social History   Tobacco Use   Smoking status: Never    Passive exposure: Never   Smokeless tobacco: Never   Vaping Use   Vaping status: Never Used  Substance Use Topics   Alcohol use: No    Alcohol/week: 0.0 standard drinks of alcohol   Drug use: No     Allergies   Shellfish allergy, Oxycodone, Penicillins, Iodine, Penicillin g, Iodinated contrast media, and Sulfa antibiotics   Review of Systems Review of Systems  Respiratory:  Negative for chest tightness, shortness of breath and wheezing.   Cardiovascular:  Negative for chest pain and leg swelling.  Musculoskeletal:  Positive for myalgias.     Physical Exam Triage Vital Signs ED Triage Vitals  Encounter Vitals Group     BP 04/16/24 1327 (!) 151/84     Girls Systolic BP Percentile --      Girls Diastolic BP Percentile --      Boys Systolic BP Percentile --      Boys Diastolic BP Percentile --      Pulse Rate 04/16/24 1327 89     Resp 04/16/24 1327 18     Temp 04/16/24 1327 97.6 F (36.4 C)     Temp Source 04/16/24 1327 Oral     SpO2 04/16/24 1327 95 %     Weight 04/16/24 1327 218 lb (98.9 kg)     Height 04/16/24 1327 5' 1.5 (1.562 m)     Head Circumference --      Peak Flow --      Pain Score 04/16/24 1341 7     Pain Loc --      Pain Education --      Exclude  from Growth Chart --    No data found.  Updated Vital Signs BP (!) 151/84 (BP Location: Right Arm)   Pulse 89   Temp 97.6 F (36.4 C) (Oral)   Resp 18   Ht 5' 1.5 (1.562 m)   Wt 218 lb (98.9 kg)   SpO2 95%   BMI 40.52 kg/m   Visual Acuity Right Eye Distance:   Left Eye Distance:   Bilateral Distance:    Right Eye Near:   Left Eye Near:    Bilateral Near:     Physical Exam Vitals reviewed.  Constitutional:      General: She is awake.     Appearance: Normal appearance. She is well-developed and well-groomed.  HENT:     Head: Normocephalic and atraumatic.  Eyes:     General: Lids are normal. Gaze aligned appropriately.     Extraocular Movements: Extraocular movements intact.     Conjunctiva/sclera: Conjunctivae normal.  Pulmonary:      Effort: Pulmonary effort is normal.     Breath sounds: Normal breath sounds.  Musculoskeletal:     Right hip: No deformity or tenderness. Normal range of motion. Normal strength.     Left hip: No deformity or tenderness. Normal range of motion. Normal strength.     Comments:  Right thigh circumference 22 inches, left thigh circumference 22 inches. Right calf circumference 17 inches, left calf circumference 17 inches.  Neurological:     Mental Status: She is alert and oriented to person, place, and time.  Psychiatric:        Attention and Perception: Attention and perception normal.        Mood and Affect: Mood and affect normal.        Speech: Speech normal.        Behavior: Behavior normal. Behavior is cooperative.      UC Treatments / Results  Labs (all labs ordered are listed, but only abnormal results are displayed) Labs Reviewed - No data to display  EKG   Radiology No results found.  Procedures Procedures (including critical care time)  Medications Ordered in UC Medications - No data to display  Initial Impression / Assessment and Plan / UC Course  I have reviewed the triage vital signs and the nursing notes.  Pertinent labs & imaging results that were available during my care of the patient were reviewed by me and considered in my medical decision making (see chart for details).    Reviewed ED encounter notes and results from her visit on 02/21/24 following fall. I have also reviewed her encounter visit notes from [facility] on 03/04/24 regarding her left thigh pain and left shoulder pain.   Final Clinical Impressions(s) / UC Diagnoses   Final diagnoses:  Pain in left thigh  Right thigh pain Chronic right thigh pain since July, worsened post-fall on August 1st. Pain is anterior, occasionally radiating distally. No recent surgery or immobilization. No signs of DVT such as erythema, warmth, or significant edema. Differential diagnosis includes muscular injury. Pain  exacerbated by standing and bending, minimally relieved by acetaminophen  and tramadol . pt reports ongoing concern for possible thrombus and would like this ruled out today.  - Order ultrasound of the right thigh to rule out thrombus. - Continue current analgesic regimen with acetaminophen  and tramadol  as needed. -Recommend follow up with PCP for ongoing management given chronic pain that is not resolving or improving- may need referral to Physical therapy for ongoing management    Discharge Instructions  VISIT SUMMARY:  You came in today because of pain in your right thigh that has been ongoing since July and worsened after a fall on August 1st. The pain sometimes spreads to your hip and buttocks and gets worse when you stand for a long time or bend over. It also disrupts your sleep. You have been using a topical cream and taking Tylenol  and Tramadol  at night, which provide some relief. You are concerned about the possibility of a blood clot.  YOUR PLAN:  -RIGHT THIGH PAIN: You have been experiencing chronic pain in your right thigh, which worsened after a fall. The pain is mainly in the front of your thigh and sometimes spreads down your leg. There are no signs of a blood clot, but to be sure, we will order an ultrasound of your right thigh. Continue taking Tylenol  and Tramadol  as needed for pain relief.  INSTRUCTIONS:  Please schedule an ultrasound of your right thigh to rule out a blood clot. Continue your current pain relief regimen with Tylenol  and Tramadol  as needed.  Please follow-up with your PCP for ongoing management and go to the emergency room if your pain becomes worse.     ED Prescriptions   None    PDMP not reviewed this encounter.   Marylene Rocky BRAVO, PA-C 04/16/24 1454    Hamdi Vari E, PA-C 04/16/24 1458

## 2024-04-16 NOTE — Progress Notes (Signed)
 Ultrasound was negative for DVT. Recommend follow up with PCP for ongoing management of chronic thigh pain

## 2024-04-16 NOTE — Telephone Encounter (Signed)
 FYI Only or Action Required?: FYI only for provider.  Patient was last seen in primary care on 04/13/2024 by Frann Mabel Mt, DO.  Called Nurse Triage reporting Leg Pain.  Symptoms began several days ago.  Interventions attempted: Rest, hydration, or home remedies.  Symptoms are: gradually worsening.  Triage Disposition: See HCP Within 4 Hours (Or PCP Triage)  Patient/caregiver understands and will follow disposition?:    Copied from CRM #[id number]. Topic: Clinical - Medical Advice >> Apr 16, 2024 12:03 PM Robinson H wrote: Reason for CRM: Patient returning call to office regarding appointment for today to be evaluated for blood clots, no appointments available reached out to [facility] and was advised to tell patient to schedule for tomorrow or to to ER. Reason for Disposition  [1] Thigh or calf pain AND [2] only 1 side AND [3] present > 1 hour  (Exception: Chronic unchanged pain.)  Answer Assessment - Initial Assessment Questions Additional info: No appointments are available in office today, no appointments available at regional offices today, advised uc for increasing thigh pain with new onset edema. Patient in agreement and will proceed to urgent care.     1. ONSET: When did the pain start?      Several weeks ago intermittently, now chronic and pain increasing, keeping her awake overnight. Mild edema to thigh noted today.  2. LOCATION: Where is the pain located?      thigh 3. PAIN: How bad is the pain?    (Scale 1-10; or mild, moderate, severe)     Kept waking her up last night  4. WORK OR EXERCISE: Has there been any recent work or exercise that involved this part of the body?      no 5. CAUSE: What do you think is causing the leg pain?     Unsure-worried for blood clot 6. OTHER SYMPTOMS: Do you have any other symptoms? (e.g., chest pain, back pain, breathing difficulty, swelling, rash, fever, numbness, weakness)     Denies  Protocols used: Leg Pain-A-AH

## 2024-04-16 NOTE — Discharge Instructions (Signed)
 VISIT SUMMARY:  You came in today because of pain in your right thigh that has been ongoing since July and worsened after a fall on August 1st. The pain sometimes spreads to your hip and buttocks and gets worse when you stand for a long time or bend over. It also disrupts your sleep. You have been using a topical cream and taking Tylenol  and Tramadol  at night, which provide some relief. You are concerned about the possibility of a blood clot.  YOUR PLAN:  -RIGHT THIGH PAIN: You have been experiencing chronic pain in your right thigh, which worsened after a fall. The pain is mainly in the front of your thigh and sometimes spreads down your leg. There are no signs of a blood clot, but to be sure, we will order an ultrasound of your right thigh. Continue taking Tylenol  and Tramadol  as needed for pain relief.  INSTRUCTIONS:  Please schedule an ultrasound of your right thigh to rule out a blood clot. Continue your current pain relief regimen with Tylenol  and Tramadol  as needed.  Please follow-up with your PCP for ongoing management and go to the emergency room if your pain becomes worse.

## 2024-04-17 ENCOUNTER — Ambulatory Visit

## 2024-04-17 DIAGNOSIS — M5416 Radiculopathy, lumbar region: Secondary | ICD-10-CM

## 2024-04-17 DIAGNOSIS — R2689 Other abnormalities of gait and mobility: Secondary | ICD-10-CM

## 2024-04-17 DIAGNOSIS — M79604 Pain in right leg: Secondary | ICD-10-CM

## 2024-04-17 DIAGNOSIS — M6281 Muscle weakness (generalized): Secondary | ICD-10-CM | POA: Diagnosis not present

## 2024-04-17 DIAGNOSIS — M79652 Pain in left thigh: Secondary | ICD-10-CM | POA: Diagnosis not present

## 2024-04-17 DIAGNOSIS — M25512 Pain in left shoulder: Secondary | ICD-10-CM | POA: Diagnosis not present

## 2024-04-17 DIAGNOSIS — R262 Difficulty in walking, not elsewhere classified: Secondary | ICD-10-CM

## 2024-04-17 DIAGNOSIS — M545 Low back pain, unspecified: Secondary | ICD-10-CM | POA: Diagnosis not present

## 2024-04-17 DIAGNOSIS — M5459 Other low back pain: Secondary | ICD-10-CM

## 2024-04-17 NOTE — Therapy (Addendum)
 OUTPATIENT PHYSICAL THERAPY THORACOLUMBAR TREATMENT   Patient Name: Katelyn Fisher MRN: [id number] DOB:[birth year], 73 y.o., female Today's Date: 04/17/2024  END OF SESSION:  PT End of Session - 04/17/24 1014     Visit Number 4    Date for Recertification  06/02/24    PT Start Time 0930    PT Stop Time 1015    PT Time Calculation (min) 45 min    Activity Tolerance Patient tolerated treatment well;No increased pain    Behavior During Therapy [facility] for tasks assessed/performed            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   Anemia 11/07/2011   Anxiety    Arthritis    back   Asymptomatic postmenopausal status 10/01/2008   Qualifier: Diagnosis of   By: Josetta Corrigan      IMO SNOMED Dx Update Oct 2024     Chronic rhinitis 08/21/2021   Current use of beta blocker 06/30/2018   Depression    Diabetes mellitus without complication (HCC)    Endometrial cancer (HCC) 2015   Essential hypertension 08/13/2006   Qualifier: Diagnosis of   By: Nicholaus Channel         Family history of breast cancer    Family history of colon cancer    Family history of ovarian cancer    Family history of prostate cancer    Flank pain 09/14/2019   Gastroesophageal reflux disease without esophagitis 09/14/2010   Qualifier: Diagnosis of   By: Anice CMA, Darlene         GERD (gastroesophageal reflux disease)    H/O total hysterectomy with bilateral salpingo-oophorectomy (BSO) 06/24/2014   Formatting of this note might be different from the original.  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     History of colonic diverticulitis    History of colonic polyps 03/10/2008   Qualifier: Diagnosis of   By: Genie CMA LEODIS), Chick      IMO SNOMED  Dx Update Oct 2024     History of endometrial cancer 02/03/2018   Hyperlipidemia    Hypertension    Hypothyroidism 06/01/2013   hypothyroid     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   Low back pain radiating to right leg 08/18/2012   Mass of left elbow 09/14/2019   Microcytosis 09/28/2016   Mild intermittent asthma without complication 01/26/2016   Moderate persistent asthma without complication 08/07/2018   Morbid obesity (HCC) 11/09/2014   Obstructive sleep apnea 11/15/2010   Osteoarthritis 11/15/2010   B/l knees     Osteopenia 09/2017   T score -1.2 FRAX 3.2% / 0.3%, 2025 -2.0 frax 10.9, 2.2%   Perennial allergic rhinitis 06/30/2018   Personal history of chemotherapy 2015   Port catheter in place 11/15/2015   Screening Mammogram 11/05/2017   Subacromial impingement of left shoulder 12/30/2018   Thyroid  disease 06/01/2013   Type 2 diabetes mellitus with diabetic neuropathy, without long-term current use of insulin (HCC) 10/25/2016   Type 2 diabetes mellitus with obesity 03/12/2011   Past Surgical History:  Procedure Laterality Date   ABDOMINAL HYSTERECTOMY  06/23/2014   [facility], TRH/BSO   CHOLECYSTECTOMY  1991   COLONOSCOPY     DILATION AND CURETTAGE OF UTERUS  IR REMOVAL TUN ACCESS W/ PORT W/O FL MOD SED  04/30/2017   POLYPECTOMY     TUBAL LIGATION     Patient Active Problem List   Diagnosis Date Noted   Syncope and collapse 11/19/2023   Dyspnea on exertion 11/19/2023   Palpitations 11/19/2023   Cardiac murmur 11/19/2023   Allergy    Anxiety    Arthritis    Depression    Diabetes mellitus without complication (HCC)    GERD (gastroesophageal reflux disease)    History of colonic diverticulitis    Hypertension    Acute cough 12/19/2022   Chronic rhinitis 08/21/2021   Mass of left elbow 09/14/2019   Flank pain 09/14/2019   Subacromial impingement of left shoulder 12/30/2018   Moderate persistent asthma without complication 08/07/2018   Current use of beta blocker  06/30/2018   Perennial allergic rhinitis 06/30/2018   Acute bronchitis due to other specified organisms 06/30/2018   History of endometrial cancer 02/03/2018   Screening Mammogram 11/05/2017   Osteopenia 09/2017   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   Type 2 diabetes mellitus with diabetic neuropathy, without long-term current use of insulin (HCC) 10/25/2016   Microcytosis 09/28/2016   Mild intermittent asthma without complication 01/26/2016   Port catheter in place 11/15/2015   Family history of breast cancer    Family history of ovarian cancer    Family history of prostate cancer    Family history of colon cancer    Morbid obesity (HCC) 11/09/2014   H/O total hysterectomy with bilateral salpingo-oophorectomy (BSO) 06/24/2014   Endometrial cancer (HCC) 05/20/2014   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   Personal history of chemotherapy 2015   Hypothyroidism 06/01/2013   Thyroid  disease 06/01/2013   Low back pain radiating to right leg 08/18/2012   Anemia 11/07/2011   Hyperlipidemia 06/15/2011   Type 2 diabetes mellitus with obesity 03/12/2011   Acute pain of right knee 01/27/2011   Obstructive sleep apnea 11/15/2010   Osteoarthritis 11/15/2010   Gastroesophageal reflux disease without esophagitis 09/14/2010   DIVERTICULITIS, HX OF 09/14/2010   Asymptomatic postmenopausal status 10/01/2008   History of colonic polyps 03/10/2008   Essential hypertension 08/13/2006    PCP: Katelyn Mabel Mt, DO   REFERRING PROVIDER: Frann Mabel Fisher*   REFERRING DIAG: M54.50 (ICD-10-CM) - Acute bilateral low back pain without sciatica M25.512 (ICD-10-CM) - Acute pain of left shoulder M79.652 (ICD-10-CM) - Pain of left thigh  THERAPY DIAG:  Other low back pain  Pain in right leg  Other abnormalities of gait and mobility  Muscle weakness (generalized)  Radiculopathy, lumbar region  Difficulty in walking, not elsewhere classified  RATIONALE FOR EVALUATION AND  TREATMENT: Rehabilitation  ONSET DATE: 02/21/24  NEXT MD VISIT:    SUBJECTIVE:  SUBJECTIVE STATEMENT: Pt reports that her pain increased after last session, she actually took a tramadol    PAIN: Are you having pain? Yes: NPRS scale:  5/10 R low back, 3/10 L leg Pain location: low back, R hip/lateral thigh and calf Pain description: stabbing quick pain at times with movements, dull aching otherwise Aggravating factors: Bending over Relieving factors: sitting with leg propped up  PERTINENT HISTORY:  Diabetes, HTN, depression, h/o endometrial CA 10 yrs ago, OA, anemia, obesity  PRECAUTIONS: Fall  RED FLAGS: None  WEIGHT BEARING RESTRICTIONS: No  FALLS:  Has patient fallen in last 6 months? Yes. Number of falls 1  LIVING ENVIRONMENT: Lives with: lives with their family and lives with their spouse Lives in: House/apartment Stairs: Yes: External: 2 steps; yes Has following equipment at home: Single point cane  OCCUPATION: retired [employer] operational officer  PLOF: Independent with gait  PATIENT GOALS: get rid of the pain   OBJECTIVE: (objective measures completed at initial evaluation unless otherwise dated)  DIAGNOSTIC FINDINGS:  EXAM: LEFT SHOULDER - 2+ VIEW   COMPARISON:  None Available.   FINDINGS: There is no evidence of fracture or dislocation. There is no evidence of arthropathy. Rounded calcification is seen over greater tuberosity suggesting calcific tendinosis. Soft tissues are unremarkable.   IMPRESSION: Possible calcific tendinosis.  No acute abnormality seen.     Electronically Signed   By: Lynwood Landy Raddle M.D.   On: 02/21/2024 14:27  EXAM: LUMBAR SPINE - COMPLETE 4+ VIEW   COMPARISON:  May 11, 2019.   FINDINGS: No fracture or significant spondylolisthesis.  Minimal degenerative disc disease is noted at L2-3. Moderate degenerative disc disease is noted at L5-S1 hypertrophy of posterior facet joints is seen at L4-5 and L5-S1 secondary to degenerative change.   IMPRESSION: Multilevel degenerative changes as noted above. No acute abnormality seen.     Electronically Signed   By: Lynwood Landy Raddle M.D.   On: 02/21/2024 14:25EXAM: LEFT KNEE - COMPLETE 4 VIEW; LEFT TIBIA AND FIBULA - 2 VIEW; LEFT FEMUR 2 VIEWS   COMPARISON:  None Available.   FINDINGS: There are no findings of fracture or dislocation. No joint effusion. Mild tricompartmental degenerative changes of the knee. Mild subcutaneous soft tissue reticulations of the lower leg.   IMPRESSION: 1. No acute fracture or dislocation. 2. Mild subcutaneous soft tissue reticulations of the lower leg, which may represent edema or contusion.     Electronically Signed   By: Limin  Xu M.D.   On: 02/21/2024 14:48  10/10/19 - Lumbar MRI IMPRESSION: 1. Facet osteoarthritis at L3-4 and below with L4-5 and L5-S1 anterolisthesis. 2. Variable disc degeneration at L2-3 and below, advanced at L5-S1. 3. Spinal stenosis that is high-grade at L4-5 and moderate at L5-S1. 4. Moderate foraminal narrowing on the left at L4-5 and bilaterally at L5-S1.10/10/19 - Lumbar MRI IMPRESSION: 1. Facet osteoarthritis at L3-4 and below with L4-5 and L5-S1 anterolisthesis. 2. Variable disc degeneration at L2-3 and below, advanced at L5-S1. 3. Spinal stenosis that is high-grade at L4-5 and moderate at L5-S1. 4. Moderate foraminal narrowing on the left at L4-5 and bilaterally at L5-S1.  PATIENT SURVEYS:  LEFS = 28/80 ODI = 29 / 50 = 58.0 % QuickDash =  38.6 / 100 = 38.6 %    SCREENING FOR RED FLAGS: Bowel or bladder incontinence: No Spinal tumors: No Cauda equina syndrome: No Compression fracture: No Abdominal aneurysm: No  COGNITION:  Overall cognitive status: Within functional limits for tasks  assessed  SENSATION: [facility]  POSTURE:  forward head; some R shift, lower L shoulder  PALPATION: TTP over the R paraspinals, SI, and piriformis areas  LUMBAR ROM:   Active  Eval  Flexion To knees;p!  Extension 70%; feels better  Right lateral flexion To knee; some p!  Left lateral flexion To mid thigh; p!  Right rotation 70%; some p!  Left rotation 50%; more p!  (Blank rows = not tested)  MUSCLE LENGTH: Hamstrings: Right SLR 70 deg; Left SLR 40 deg Thomas test: Right NT deg; Left 10 deg Hamstrings: 90/90 is 20 deg on LLE ITB: mild tight L Piriformis: mild tight L Hip flexors: min tightness  LOWER EXTREMITY ROM:     Active  Right eval Left eval  Hip flexion    Hip extension    Hip abduction    Hip adduction    Hip internal rotation    Hip external rotation    Knee flexion 110 97  Knee extension    Ankle dorsiflexion    Ankle plantarflexion    Ankle inversion    Ankle eversion    (Blank rows = not tested)  LOWER EXTREMITY MMT:    MMT Right eval Left eval  Hip flexion 5   Hip extension    Hip abduction 4   Hip adduction    Hip internal rotation 5 4-  Hip external rotation 5 4  Knee flexion 5 4+  Knee extension 5 4  Ankle dorsiflexion 5 5  Ankle plantarflexion 4 3  Ankle inversion    Ankle eversion     (Blank rows = not tested)  LUMBAR SPECIAL TESTS:  Straight leg raise test: Positive, Slump test: Positive, SI Compression/distraction test: Negative, and FABER test: Negative  FUNCTIONAL TESTS:  TBD  GAIT: Distance walked: into clinic Assistive device utilized: None Level of assistance: Complete Independence Gait pattern: limps on LLE, decreased L weight shift Comments:    TODAY'S TREATMENT:  04/17/24 Bike L1x60min Pelvic alignment assessed: L leg slightly shorter than R Put cork in L shoe to promote more neutral pelvic alignment in standing IASTM with foam roll R glutes and R low back Passive R piriformis and figure 4 stretching DKTC on orange  pball x 20 Bridge straight leg on orange pball x 10  LTR on orange pball x 10   04/15/24 NEUROMUSCULAR RE-EDUCATION: To improve coordination, kinesthesia, posture, and proprioception.  Quadruped cat/cow x 10 B Quadruped hip extension x 15 B Quadruped x 10 fire hydrants B- more difficult LLE, keeps foot on table Supine ab sets 10x5 Supine march + TrA 10x5 Bridge with TrA x 20  THERAPEUTIC EXERCISE: To improve strength, endurance, ROM, and flexibility.  Nustep L4x56min Seated pball rollout SB each way x 10 Seated R/L QL stretch x 30'  MANUAL THERAPY: To promote normalized muscle tension, improve joint mobility and/or for pain modulation  STM to R lumbar paraspinals, QL closer to origin on iliac crest  04/09/24 THERAPEUTIC EXERCISE: To improve strength, endurance, and ROM.  Demonstration, verbal and tactile cues throughout for technique. NuStep L5 x 6'  THERAPEUTIC ACTIVITIES: To improve functional performance.  Demonstration, verbal and tactile cues throughout for technique. SKTC RLE x 10 Bridge x 20 BLE LTR x 20 BLE Prone lying over 2 pillows with MFR foam roll and manual x 10' Prone over 2 pillow press up x 10 Checked leg length and RLE is slightly longer Gait in clinic demonstrates increased hip ER and toe out on the LLE and fairly straight RLE  NEUROMUSCULAR  RE-EDUCATION: To improve balance, posture, and proprioception. Prone over 2 pillows modified plank from knees x 2/10 Quadruped cat/cow x 20 Quadruped knee extension x 10 BLE Quadruped marching with hands x 10 BUE    04/07/24 SELF CARE: Provided education on PT POC progression.initial HEP   PATIENT EDUCATION:  Education details: HEP review and HEP update  Person educated: Patient Education method: Explanation, Demonstration, Verbal cues, Tactile cues, and Handouts Education comprehension: verbalized understanding, verbal cues required, tactile cues required, and needs further education  HOME EXERCISE  PROGRAM: Access Code: [email or web address] URL: [email or web address] Date: 04/09/2024 Prepared by: Garnette Montclair  Exercises - Supine Bridge  - 1 x daily - 7 x weekly - 3 sets - 10 reps - Supine Lower Trunk Rotation  - 1 x daily - 7 x weekly - 3 sets - 10 reps - Supine Piriformis Stretch with Leg Straight  - 1 x daily - 7 x weekly - 1 sets - 1 reps - 1 min hold - Modified Thomas Stretch  - 1 x daily - 7 x weekly - 1 sets - 1 reps - 1 min hold - Lying Prone with 2 Pillows  - 1 x daily - 7 x weekly - 1 sets - 1 reps - 2-3 min hold - Prone Press Up  - 1 x daily - 7 x weekly - 3 sets - 10 reps - Plank on Knees  - 1 x daily - 7 x weekly - 2 sets - 10 reps - Cat Cow  - 1 x daily - 7 x weekly - 3 sets - 10 reps - Quadruped Alternating Leg Extensions  - 1 x daily - 7 x weekly - 3 sets - 10 reps  ASSESSMENT:  CLINICAL IMPRESSION: Pt presented with L hip higher than R today, thus provided a cork for L shoe to promote neutral alignment of the pelvis. Pt reported that it felt better afterwards, may put in rubber next visit. We did more manual with stretching and light strengthening today since we overdid it the last visit. She responded very well. Will continue with light strengthening and stretching next visit.  EVAL: Katelyn Fisher is a 73 y.o. female who was referred to physical therapy for evaluation and treatment for LBP, L thigh pain, and L shoulder pain s/p mechanical fall on 02/21/24   Patient reports onset of low back pain and RLE beginning 02/21/24. Pain is worse with bending, twisting, and lifting.  She has been her for PT in the past for LBP w/ LLE radiculopathy and has an old MRI that shows foraminal stenosis.  Patient has deficits in lumbar ROM, L LE flexibility, LLE strength, abnormal posture, and TTP in the low back and L hip which are interfering with ADLs and are impacting quality of life.  On Modified Oswestry patient scored 29 / 50 = 58.0 %.  Willadeen will benefit from skilled PT to  address above deficits to improve mobility and activity tolerance with decreased pain interference. Patient is advised that we are going to focus on her LBP and LLE pain since this seems to be her bigger problem rather than the shoulder for now.   If the back pain resolves then we can evaluate her shoulder pain, but we cannot adequately treat these 2 problems at the same time so we will focus on one. Patient is agreeable to this plan.  OBJECTIVE IMPAIRMENTS: Abnormal gait, difficulty walking, decreased ROM, decreased strength, and pain.   ACTIVITY LIMITATIONS: lifting, bending, and locomotion level  PARTICIPATION LIMITATIONS: cleaning and laundry  PERSONAL FACTORS: Age, Fitness, Time since onset of injury/illness/exacerbation, and 3+ comorbidities:   Diabetes, HTN, depression, h/o endometrial CA 10 yrs ago, OA, anemia, obesityare also affecting patient's functional outcome.   REHAB POTENTIAL: Good  CLINICAL DECISION MAKING: Evolving/moderate complexity  EVALUATION COMPLEXITY: Moderate   GOALS: Goals reviewed with patient? Yes  SHORT TERM GOALS: Target date: 05/05/2024     Patient will be independent with initial HEP to improve outcomes and carryover.  Baseline: 100% PT assist required for correct completion Goal status: INITIAL  2.  Patient will report 25% improvement in low back pain to improve QOL. Baseline: 10/10 worst Goal status: MET- 04/15/24  LONG TERM GOALS: Target date: 06/02/2024  Patient will be independent with ongoing/advanced HEP for self-management at home.  Baseline: no advanced HEP yet Goal status: INITIAL  2.  Patient will report 50-75% improvement in low back pain to improve QOL.  Baseline: 10/10 WORST Goal status: INITIAL  3.  Patient to demonstrate ability to achieve and maintain good spinal alignment/posturing and body mechanics needed for daily activities. Baseline: difficulty with PPT in standing with core contraction--shaky abdominals Goal status:  INITIAL  4.  Patient will demonstrate full pain free lumbar ROM to perform ADLs.   Baseline: Refer to above lumbar ROM table Goal status: INITIAL  5.  Patient will demonstrate improved BLE strength to >/= 5/5 for improved stability and ease of mobility. Baseline: Refer to above LE MMT table Goal status: INITIAL  6. Patient will report </= 40% on Modified Oswestry (MCID = 12%) to demonstrate improved functional ability with decreased pain interference. Baseline: 58% Goal status: INITIAL   PLAN:  PT FREQUENCY: 1-2x/week  PT DURATION: 8 weeks  PLANNED INTERVENTIONS: 97164- PT Re-evaluation, 97750- Physical Performance Testing, 97110-Therapeutic exercises, 97530- Therapeutic activity, [id number]- Neuromuscular re-education, 97535- Self Care, 02859- Manual therapy, [id number]- Gait training, [id number]- Electrical stimulation (unattended), 97016- Vasopneumatic device, [id number]- Ultrasound, [id number]- Traction (mechanical), [id number]- Ionotophoresis 4mg /ml Dexamethasone , 79439 (1-2 muscles), 20561 (3+ muscles)- Dry Needling, Patient/Family education, Balance training, Stair training, Taping, Joint mobilization, Spinal mobilization, Cryotherapy, and Moist heat  PLAN FOR NEXT SESSION: how was the cork insert? Progress lumbar stabilization, lateral hip strengthening    Kianah Harries L Dula Havlik, PTA 04/17/2024, 10:22 AM   Date of referral: see referral Referring provider: [place]  Referring diagnosis? M54.50 (ICD-10-CM) - Acute bilateral low back pain without sciatica M25.512 (ICD-10-CM) - Acute pain of left shoulder M79.652 (ICD-10-CM) - Pain of left thigh  Treatment diagnosis? (if different than referring diagnosis) R26.89, M54.59, M79.604, M62.81  What was this (referring dx) caused by? Felton Hawks of Condition: Initial Onset (within last 3 months)   Laterality: Lt  Current Functional Measure Score: Back Index modified ODI 29 / 50 = 58.0 %  Objective measurements identify impairments when they are compared to  normal values, the uninvolved extremity, and prior level of function.  [x]  Yes  []  No  Objective assessment of functional ability: Moderate functional limitations   Briefly describe symptoms: 73 y/o referred to PT from PCP for a mechanical fall on 02/21/24.   Patient reports she was trying to get away from [place] in her neighbor's yard and turned quickly and fell on L side.   Went to the ED and had negative Xrays, but still having L low back and leg pain.   States the pain is intermittent and worse with most all activities.  States pain begins in her low back and radiates down the L  lateral hip, thigh and calf all the way to her ankle.  States the pain is stabbing and that she has to keep her leg elevated in sitting positions (such as on a footrest) to relieve her pain.  States can't bend over.  Likes to walk for exercise, work in her flowers, and baking.  States bending over to reach into her lower cabinets hurts.    How did symptoms start: fall  Average pain intensity:  Last 24 hours: 6/10  Past week: 10/10  How often does the pt experience symptoms? Frequently  How much have the symptoms interfered with usual daily activities? Quite a bit  How has condition changed since care began at this facility? No change  In general, how is the patients overall health? Fair   BACK PAIN (STarT Back Screening Tool) Has pain spread down the leg(s) at some time in the last 2 weeks? yes Has there been pain in the shoulder or neck at some time in the last 2 weeks? Yes Has the pt only walked short distances because of back pain? yes Has patient dressed more slowly because of back pain in the past 2 weeks? yes Does patient think it's not safe for a person with this condition to be physically active? no Does patient have worrying thoughts a lot of the time? no Does patient feel back pain is terrible and will never get any better? no Has patient stopped enjoying things they usually enjoy? no

## 2024-04-20 ENCOUNTER — Telehealth: Payer: Self-pay | Admitting: *Deleted

## 2024-04-20 ENCOUNTER — Telehealth (HOSPITAL_COMMUNITY): Payer: Self-pay | Admitting: Pharmacy Technician

## 2024-04-20 ENCOUNTER — Other Ambulatory Visit (HOSPITAL_COMMUNITY): Payer: Self-pay | Admitting: Pharmacy Technician

## 2024-04-20 ENCOUNTER — Ambulatory Visit (INDEPENDENT_AMBULATORY_CARE_PROVIDER_SITE_OTHER): Admitting: *Deleted

## 2024-04-20 VITALS — Ht 65.0 in | Wt 224.0 lb

## 2024-04-20 DIAGNOSIS — Z0000 Encounter for general adult medical examination without abnormal findings: Secondary | ICD-10-CM

## 2024-04-20 DIAGNOSIS — M810 Age-related osteoporosis without current pathological fracture: Secondary | ICD-10-CM | POA: Insufficient documentation

## 2024-04-20 NOTE — Telephone Encounter (Signed)
 Call to patient. Patient advised of Dr. Glennon wanting patient to do Reclast. Patient verbalized understanding. Aware [facility] will be calling to review benefits and schedule infusion. Patient scheduled for lab work on 05/13/24 at 0845. Declined earlier appointment due to travel and daughter's wedding. Orders placed for labs. Aware infusion will need to be within 30 days of lab work.  Routing to Dr. Glennon to review Reclast plan and sign under Infusion Navigator.

## 2024-04-20 NOTE — Telephone Encounter (Signed)
 I agree with plan. Therapy 1 signed Dr. Glennon

## 2024-04-20 NOTE — Progress Notes (Addendum)
 Subjective:   Katelyn Fisher is a 73 y.o. who presents for a Medicare Wellness preventive visit.  As a reminder, Annual Wellness Visits don't include a physical exam, and some assessments may be limited, especially if this visit is performed virtually. We may recommend an in-person follow-up visit with your provider if needed.  Visit Complete: Virtual I connected with  Katelyn Fisher on 04/20/24 by a audio enabled telemedicine application and verified that I am speaking with the correct person using two identifiers.  Patient Location: Home  Provider Location: Office/Clinic  I discussed the limitations of evaluation and management by telemedicine. The patient expressed understanding and agreed to proceed.  Vital Signs: Because this visit was a virtual/telehealth visit, some criteria may be missing or patient reported. Any vitals not documented were not able to be obtained and vitals that have been documented are patient reported.  VideoDeclined- This patient declined Librarian, academic. Therefore the visit was completed with audio only.  Persons Participating in Visit: Patient.  AWV Questionnaire: Yes: Patient Medicare AWV questionnaire was completed by the patient on 04/16/24; I have confirmed that all information answered by patient is correct and no changes since this date.  Cardiac Risk Factors include: advanced age (>70men, >93 women);hypertension;dyslipidemia;diabetes mellitus;Other (see comment), Risk factor comments: OSA, Asthma, Hx endometrial cancer     Objective:    Today's Vitals   04/20/24 0949  Weight: 224 lb (101.6 kg)  Height: 5' 5 (1.651 m)   Body mass index is 37.28 kg/m.     04/20/2024   10:23 AM 04/07/2024    9:39 AM 02/21/2024    1:08 PM 11/03/2023    4:49 PM 05/09/2023    9:00 AM 01/02/2023    8:07 AM 05/07/2022    9:03 AM  Advanced Directives  Does Patient Have a Medical Advance Directive? Yes Yes No Yes Yes Yes No;Yes   Type of Estate agent of [place];Living will Living will;Healthcare Power of [employer] Power of [employer] Power of [place];Living will Healthcare Power of [place];Living will Healthcare Power of [place];Living will  Does patient want to make changes to medical advance directive? No - Patient declined No - Patient declined   No - Patient declined No - Patient declined No - Patient declined  Copy of Healthcare Power of Attorney in Chart? No - copy requested No - copy requested   Yes - validated most recent copy scanned in chart (See row information) Yes - validated most recent copy scanned in chart (See row information) Yes - validated most recent copy scanned in chart (See row information)  Would patient like information on creating a medical advance directive?       No - Patient declined    Current Medications (verified) Outpatient Encounter Medications as of 04/20/2024  Medication Sig   albuterol  (PROAIR  HFA) 108 (90 Base) MCG/ACT inhaler Inhale 2 puffs by mouth into the lungs every 4 (four) hours as needed for coughing or wheezing spells.   albuterol  (PROVENTIL ) (2.5 MG/3ML) 0.083% nebulizer solution Take 3 mLs (2.5 mg total) by nebulization every 4 (four) hours as needed for wheezing or shortness of breath.   amLODipine  (NORVASC ) 5 MG tablet Take 1 tablet (5 mg total) by mouth daily.   atorvastatin  (LIPITOR) 40 MG tablet TAKE 1 TABLET BY MOUTH DAILY   benazepril  (LOTENSIN ) 20 MG tablet TAKE 1 TABLET BY MOUTH AT  BEDTIME   betamethasone  dipropionate (DIPROLENE ) 0.05 % ointment Apply topically to affected  area daily as needed.   Betamethasone  Dipropionate POWD 1 application Externally Once a day prn 30 days   budesonide -formoterol  (SYMBICORT ) 160-4.5 MCG/ACT inhaler Inhale 2 puffs into the lungs as needed.   carvedilol  (COREG ) 12.5 MG tablet TAKE 1 TABLET BY MOUTH TWICE  DAILY WITH A MEAL   EPINEPHrine  (EPIPEN  2-PAK) 0.3 mg/0.3 mL IJ SOAJ injection  Use as directed for severe allergic reaction.   furosemide  (LASIX ) 20 MG tablet TAKE 1 TABLET BY MOUTH DAILY   levocetirizine (XYZAL ) 5 MG tablet Take 1 tablet (5 mg total) by mouth every evening.   levothyroxine  (SYNTHROID ) 75 MCG tablet TAKE 1 TABLET (75 MCG TOTAL) BY MOUTH DAILY BEFORE BREAKFAST. Oral for 30   montelukast  (SINGULAIR ) 10 MG tablet Take 1 tablet (10 mg total) by mouth at bedtime.   Multiple Vitamin (MULTIVITAMIN) tablet Take 1 tablet by mouth daily.   Semaglutide  (RYBELSUS ) 14 MG TABS Take 1 tablet (14 mg total) by mouth daily.   traMADol  (ULTRAM ) 50 MG tablet Take 1 tablet (50 mg total) by mouth 3 (three) times daily as needed.   [DISCONTINUED] fluticasone  (FLOVENT  HFA) 110 MCG/ACT inhaler Inhale 2 puffs into the lungs in the morning and at bedtime for 14 days.   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   Anemia 11/07/2011   Anxiety    Arthritis    back   Asymptomatic postmenopausal status 10/01/2008   Qualifier: Diagnosis of   By: Josetta Corrigan      IMO SNOMED Dx Update Oct 2024     Chronic rhinitis 08/21/2021   Current use of beta blocker 06/30/2018   Depression    Diabetes mellitus without complication (HCC)    Endometrial cancer (HCC) 2015   Essential hypertension 08/13/2006   Qualifier: Diagnosis of   By: Nicholaus Channel         Family history of breast cancer    Family history of colon cancer    Family history of ovarian cancer    Family history of prostate cancer    Flank pain 09/14/2019    Gastroesophageal reflux disease without esophagitis 09/14/2010   Qualifier: Diagnosis of   By: Anice CMA, Darlene         GERD (gastroesophageal reflux disease)    H/O total hysterectomy with bilateral salpingo-oophorectomy (BSO) 06/24/2014   Formatting of this note might be different from the original.  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     History of colonic diverticulitis    History of colonic polyps 03/10/2008   Qualifier: Diagnosis of   By: Genie CMA LEODIS), Chick      IMO SNOMED Dx Update Oct 2024     History of endometrial cancer 02/03/2018   Hyperlipidemia    Hypertension    Hypothyroidism 06/01/2013   hypothyroid     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   Low back pain radiating to right leg 08/18/2012   Mass of left elbow 09/14/2019   Microcytosis 09/28/2016   Mild intermittent asthma without complication 01/26/2016   Moderate persistent asthma  without complication 08/07/2018   Morbid obesity (HCC) 11/09/2014   Obstructive sleep apnea 11/15/2010   Osteoarthritis 11/15/2010   B/l knees     Osteopenia 09/2017   T score -1.2 FRAX 3.2% / 0.3%, 2025 -2.0 frax 10.9, 2.2%   Perennial allergic rhinitis 06/30/2018   Personal history of chemotherapy 2015   Port catheter in place 11/15/2015   Screening Mammogram 11/05/2017   Subacromial impingement of left shoulder 12/30/2018   Thyroid  disease 06/01/2013   Type 2 diabetes mellitus with diabetic neuropathy, without long-term current use of insulin (HCC) 10/25/2016   Type 2 diabetes mellitus with obesity 03/12/2011   Past Surgical History:  Procedure Laterality Date   ABDOMINAL HYSTERECTOMY  06/23/2014   [facility], TRH/BSO   CHOLECYSTECTOMY  1991   COLONOSCOPY     DILATION AND CURETTAGE OF UTERUS     IR REMOVAL TUN ACCESS W/ PORT W/O FL MOD SED  04/30/2017   POLYPECTOMY     TUBAL LIGATION     Family History  Problem Relation Age of Onset   Diabetes Mother    Lupus Mother     Prostate cancer Father 34   Diabetes Father    Cancer Father        lung cancer, asbestos exposure   Hyperlipidemia Sister    Heart disease Brother    Diabetes Brother    Heart disease Brother    Heart disease Brother    Diabetes Brother    Colon polyps Brother    Heart disease Brother    Diabetes Brother    Colon polyps Brother    Heart disease Maternal Aunt        x 3 -2 brothers   Hypertension Maternal Aunt    Breast cancer Maternal Aunt        maternal half; dx in her 46s   Cancer Maternal Aunt        cervical   Ovarian cancer Maternal Aunt        dx in her 69s   Leukemia Maternal Uncle 21   Cancer Paternal Aunt        NOS- breast    Prostate cancer Paternal Uncle        dx in his 7s   Colon cancer Maternal Grandmother        dx in her 23s   Breast cancer Other        dx in her 30s   Social History   Socioeconomic History   Marital status: Married    Spouse name: Not on file   Number of children: 3   Years of education: Not on file   Highest education level: 12th grade  Occupational History   Occupation: retired Diplomatic Services operational officer  Tobacco Use   Smoking status: Never    Passive exposure: Never   Smokeless tobacco: Never  Vaping Use   Vaping status: Never Used  Substance and Sexual Activity   Alcohol use: No    Alcohol/week: 0.0 standard drinks of alcohol   Drug use: No   Sexual activity: Yes    Partners: Male    Birth control/protection: Surgical    Comment: 1st intercourse- 15, partners- 3, hysterectomy  Other Topics Concern   Not on file  Social History Narrative   Married- 40 years   Never Smoked   Alcohol use-no   Drug use-no   Occupation: housewife   Caffeine use/day:  None   Does Patient Exercise:  yes   Social Drivers of Health  Financial Resource Strain: Low Risk  (04/20/2024)   Overall Financial Resource Strain (CARDIA)    Difficulty of Paying Living Expenses: Not very hard  Food Insecurity: No Food Insecurity (04/20/2024)   Hunger  Vital Sign    Worried About Running Out of Food in the Last Year: Never true    Ran Out of Food in the Last Year: Never true  Transportation Needs: No Transportation Needs (04/20/2024)   PRAPARE - Administrator, Civil Service (Medical): No    Lack of Transportation (Non-Medical): No  Physical Activity: Sufficiently Active (04/20/2024)   Exercise Vital Sign    Days of Exercise per Week: 7 days    Minutes of Exercise per Session: 30 min  Stress: No Stress Concern Present (04/20/2024)   [employer] of Occupational Health - Occupational Stress Questionnaire    Feeling of Stress: Not at all  Social Connections: Socially Integrated (04/20/2024)   Social Connection and Isolation Panel    Frequency of Communication with Friends and Family: More than three times a week    Frequency of Social Gatherings with Friends and Family: More than three times a week    Attends Religious Services: More than 4 times per year    Active Member of [employer] or Organizations: Yes    Attends Engineer, structural: More than 4 times per year    Marital Status: Married    Tobacco Counseling Counseling given: Not Answered    Clinical Intake:  Pre-visit preparation completed: Yes        BMI - recorded: 37.28 Nutritional Status: BMI > 30  Obese Nutritional Risks: None  Lab Results  Component Value Date   HGBA1C 6.7 (H) 04/13/2024   HGBA1C 6.2 10/11/2023   HGBA1C 7.1 (H) 04/10/2023     How often do you need to have someone help you when you read instructions, pamphlets, or other written materials from your doctor or pharmacy?: 1 - Never  Interpreter Needed?: No  Information entered by :: Lolita Libra, CMA(AAMA)   Activities of Daily Living     04/16/2024    9:08 AM 05/08/2023    5:22 PM  In your present state of health, do you have any difficulty performing the following activities:  Hearing? 0 0  Vision? 0 0  Difficulty concentrating or making decisions? 0 0   Walking or climbing stairs? 1 1  Dressing or bathing? 0 0  Doing errands, shopping? 0 1  Comment  niece Insurance claims handler and eating ? N N  Using the Toilet? N N  In the past six months, have you accidently leaked urine? N Y  Do you have problems with loss of bowel control? N N  Managing your Medications? N N  Managing your Finances? N N  Housekeeping or managing your Housekeeping? CINDERELLA CINDERELLA    Patient Care Team: Frann Mabel Mt, DO as PCP - General (Family Medicine) Bonner Ade, MD as Consulting Physician (Physical Medicine and Rehabilitation) Camella Delroy SQUIBB, MD as Consulting Physician (Oncology) Jerrol Harvey, MD as Attending Physician (Obstetrics and Gynecology) Izell Domino, MD as Attending Physician (Radiation Oncology) Chick Venetia BRAVO, MD as Consulting Physician (Family Medicine) Lavoie, Marie-Lyne, MD as Consulting Physician (Obstetrics and Gynecology)  I have updated your Care Teams any recent Medical Services you may have received from other providers in the past year.     Assessment:   This is a routine wellness examination for Enijah.  Hearing/Vision screen Hearing Screening - Comments:: Denies hearing  difficulties.  Vision Screening - Comments:: Up to date with routine eye exams with [facility]   Goals Addressed   None    Depression Screen     04/20/2024   10:00 AM 03/04/2024   11:15 AM 07/01/2023    2:06 PM 05/09/2023    9:04 AM 10/08/2022    7:04 AM 05/07/2022    9:03 AM 05/03/2021    9:13 AM  PHQ 2/9 Scores  PHQ - 2 Score 0 0 0 0 0 0 0  PHQ- 9 Score 3 0   0      Fall Risk     04/16/2024    9:08 AM 03/04/2024   11:14 AM 12/04/2023   12:43 PM 11/01/2023    9:22 AM 07/01/2023    2:05 PM  Fall Risk   Falls in the past year? 1 0 0 0 0  Comment PCP is already aware      Number falls in past yr: 0 0 0 0 0  Injury with Fall? 1 0 0 0 0  Risk for fall due to :    No Fall Risks   Follow up Education provided Falls evaluation completed  Falls evaluation completed Falls evaluation completed Falls evaluation completed    MEDICARE RISK AT HOME:  Medicare Risk at Home Any stairs in or around the home?: (Patient-Rptd) No If so, are there any without handrails?: (Patient-Rptd) No Home free of loose throw rugs in walkways, pet beds, electrical cords, etc?: (Patient-Rptd) Yes Adequate lighting in your home to reduce risk of falls?: (Patient-Rptd) Yes Use of a cane, walker or w/c?: (Patient-Rptd) No Grab bars in the bathroom?: (Patient-Rptd) Yes Shower chair or bench in shower?: (Patient-Rptd) Yes Elevated toilet seat or a handicapped toilet?: (Patient-Rptd) No  TIMED UP AND GO:  Was the test performed?  No,audio  Cognitive Function: 6CIT completed    09/09/2017    8:07 AM  MMSE - Mini Mental State Exam  Orientation to time 5   Orientation to Place 5   Registration 3   Attention/ Calculation 5   Recall 3   Language- name 2 objects 2   Language- repeat 1  Language- follow 3 step command 3   Language- read & follow direction 1   Write a sentence 1   Copy design 1   Total score 30      Data saved with a previous flowsheet row definition        04/20/2024   10:02 AM 05/09/2023    9:06 AM 05/07/2022    9:08 AM  6CIT Screen  What Year? 0 points 0 points 0 points  What month? 0 points 0 points 0 points  What time? 0 points 0 points 0 points  Count back from 20 0 points 0 points 0 points  Months in reverse 0 points 0 points 0 points  Repeat phrase 0 points 0 points 0 points  Total Score 0 points 0 points 0 points    Immunizations Immunization History  Administered Date(s) Administered   Fluad Quad(high Dose 65+) 04/30/2019, 05/11/2020, 05/11/2021, 05/10/2022   Fluad Trivalent(High Dose 65+) 06/13/2023   INFLUENZA, HIGH DOSE SEASONAL PF 05/21/2017   Influenza Split 06/11/2011, 05/21/2012   Influenza Whole 06/24/2007, 04/16/2008, 05/02/2010   Influenza,inj,Quad PF,6+ Mos 06/01/2013, 05/19/2015, 03/30/2016,  05/16/2018   Influenza-Unspecified 05/23/2014, 05/21/2017   PFIZER(Purple Top)SARS-COV-2 Vaccination 08/29/2019, 09/19/2019, 05/16/2020   PNEUMOCOCCAL CONJUGATE-20 10/04/2021   Pneumococcal Conjugate-13 05/16/2018   Pneumococcal Polysaccharide-23 09/24/2016   Td 09/26/2007  Tdap 05/16/2018   Zoster Recombinant(Shingrix ) 01/25/2021, 11/15/2021    Screening Tests Health Maintenance  Topic Date Due   Influenza Vaccine  02/21/2024   Diabetic kidney evaluation - Urine ACR  10/10/2024   FOOT EXAM  10/10/2024   HEMOGLOBIN A1C  10/11/2024   OPHTHALMOLOGY EXAM  01/28/2025   Diabetic kidney evaluation - eGFR measurement  04/13/2025   Medicare Annual Wellness (AWV)  04/20/2025   DTaP/Tdap/Td (3 - Td or Tdap) 05/16/2028   Colonoscopy  11/28/2031   Pneumococcal Vaccine: 50+ Years  Completed   DEXA SCAN  Completed   Hepatitis C Screening  Completed   Zoster Vaccines- Shingrix   Completed   HPV VACCINES  Aged Out   Meningococcal B Vaccine  Aged Out   Mammogram  Discontinued   COVID-19 Vaccine  Discontinued    Health Maintenance Items Addressed: Flu vaccine scheduled for 05/19/24 at 9:45am in the office.  Additional Screening:  Vision Screening: Recommended annual ophthalmology exams for early detection of glaucoma and other disorders of the eye. Is the patient up to date with their annual eye exam?  Yes  Who is the provider or what is the name of the office in which the patient attends annual eye exams? [facility]  Dental Screening: Recommended annual dental exams for proper oral hygiene  Community Resource Referral / Chronic Care Management: CRR required this visit?  No   CCM required this visit?  No   Plan:    I have personally reviewed and noted the following in the patient's chart:   Medical and social history Use of alcohol, tobacco or illicit drugs  Current medications and supplements including opioid prescriptions. Patient is not currently taking opioid  prescriptions. Functional ability and status Nutritional status Physical activity Advanced directives List of other physicians Hospitalizations, surgeries, and ER visits in previous 12 months Vitals Screenings to include cognitive, depression, and falls Referrals and appointments  In addition, I have reviewed and discussed with patient certain preventive protocols, quality metrics, and best practice recommendations. A written personalized care plan for preventive services as well as general preventive health recommendations were provided to patient.   Lolita Libra, CMA   04/20/2024   After Visit Summary: (MyChart) Due to this being a telephonic visit, the after visit summary with patients personalized plan was offered to patient via MyChart   Notes: Nothing significant to report at this time.

## 2024-04-20 NOTE — Telephone Encounter (Signed)
 Auth Submission: APPROVED Site of care: [facility] Payer: UHC MEDICARE Medication & CPT/J Code(s) submitted: Reclast (Zolendronic acid) [id number] Diagnosis Code: M81.0 Route of submission (phone, fax, portal): portal Phone # Fax # Auth type: Buy/Bill HB Units/visits requested: 5mg  x 1 dose, q 12 months Reference number: [id number] Approval from: 04/20/24 to 04/20/25      Dagoberto Armour, CPhT [facility] Phone: [phone] 04/20/2024

## 2024-04-20 NOTE — Patient Instructions (Addendum)
 Katelyn Fisher , Thank you for taking time out of your busy schedule to complete your Annual Wellness Visit with me. I enjoyed our conversation and look forward to speaking with you again next year. I, as well as your care team,  appreciate your ongoing commitment to your health goals. Please review the following plan we discussed and let me know if I can assist you in the future. Your Game plan/ To Do List    Follow up Visits:   Flu vaccine:  05/19/24 9:45am [facility] [place], [address]  Next Medicare AWV with our clinical staff: 04/22/25 9:40am, telephone  Next Office Visit with your provider: 10/13/24 7:30am, Dr Frann  Clinician Recommendations:  Aim for 30 minutes of exercise or brisk walking, 6-8 glasses of water, and 5 servings of fruits and vegetables each day.       This is a list of the screening recommended for you and due dates:  Health Maintenance  Topic Date Due   Flu Shot  02/21/2024   Medicare Annual Wellness Visit  05/08/2024   Yearly kidney health urinalysis for diabetes  10/10/2024   Complete foot exam   10/10/2024   Hemoglobin A1C  10/11/2024   Eye exam for diabetics  01/28/2025   Yearly kidney function blood test for diabetes  04/13/2025   DTaP/Tdap/Td vaccine (3 - Td or Tdap) 05/16/2028   Colon Cancer Screening  11/28/2031   Pneumococcal Vaccine for age over 81  Completed   DEXA scan (bone density measurement)  Completed   Hepatitis C Screening  Completed   Zoster (Shingles) Vaccine  Completed   HPV Vaccine  Aged Out   Meningitis B Vaccine  Aged Out   Breast Cancer Screening  Discontinued   COVID-19 Vaccine  Discontinued    Advanced directives: (In Chart) A copy of your advanced directives are scanned into your chart should your provider ever need it. Advance Care Planning is important because it:  [x]  Makes sure you receive the medical care that is consistent with your values, goals, and preferences  [x]  It provides guidance to your family and  loved ones and reduces their decisional burden about whether or not they are making the right decisions based on your wishes.  Follow the link provided in your after visit summary or read over the paperwork we have mailed to you to help you started getting your Advance Directives in place. If you need assistance in completing these, please reach out to us  so that we can help you!  See attachments for Preventive Care and Fall Prevention Tips.

## 2024-04-21 ENCOUNTER — Ambulatory Visit

## 2024-04-21 DIAGNOSIS — M79652 Pain in left thigh: Secondary | ICD-10-CM | POA: Diagnosis not present

## 2024-04-21 DIAGNOSIS — R2689 Other abnormalities of gait and mobility: Secondary | ICD-10-CM | POA: Diagnosis not present

## 2024-04-21 DIAGNOSIS — M5459 Other low back pain: Secondary | ICD-10-CM

## 2024-04-21 DIAGNOSIS — M5416 Radiculopathy, lumbar region: Secondary | ICD-10-CM | POA: Diagnosis not present

## 2024-04-21 DIAGNOSIS — M6281 Muscle weakness (generalized): Secondary | ICD-10-CM | POA: Diagnosis not present

## 2024-04-21 DIAGNOSIS — M79604 Pain in right leg: Secondary | ICD-10-CM | POA: Diagnosis not present

## 2024-04-21 DIAGNOSIS — M25512 Pain in left shoulder: Secondary | ICD-10-CM | POA: Diagnosis not present

## 2024-04-21 DIAGNOSIS — R262 Difficulty in walking, not elsewhere classified: Secondary | ICD-10-CM | POA: Diagnosis not present

## 2024-04-21 DIAGNOSIS — M545 Low back pain, unspecified: Secondary | ICD-10-CM | POA: Diagnosis not present

## 2024-04-21 NOTE — Therapy (Signed)
 OUTPATIENT PHYSICAL THERAPY THORACOLUMBAR TREATMENT   Patient Name: Katelyn Fisher MRN: [id number] DOB:08-22-1950, 73 y.o., female Today's Date: 04/21/2024  END OF SESSION:  PT End of Session - 04/21/24 0937     Visit Number 5    Date for Recertification  06/02/24    PT Start Time 0930    PT Stop Time 1014    PT Time Calculation (min) 44 min    Activity Tolerance Patient tolerated treatment well;No increased pain    Behavior During Therapy [facility] for tasks assessed/performed             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   Anemia 11/07/2011   Anxiety    Arthritis    back   Asymptomatic postmenopausal status 10/01/2008   Qualifier: Diagnosis of   By: Josetta Corrigan      IMO SNOMED Dx Update Oct 2024     Chronic rhinitis 08/21/2021   Current use of beta blocker 06/30/2018   Depression    Diabetes mellitus without complication (HCC)    Endometrial cancer (HCC) 2015   Essential hypertension 08/13/2006   Qualifier: Diagnosis of   By: Nicholaus Channel         Family history of breast cancer    Family history of colon cancer    Family history of ovarian cancer    Family history of prostate cancer    Flank pain 09/14/2019   Gastroesophageal reflux disease without esophagitis 09/14/2010   Qualifier: Diagnosis of   By: Anice CMA, Darlene         GERD (gastroesophageal reflux disease)    H/O total hysterectomy with bilateral salpingo-oophorectomy (BSO) 06/24/2014   Formatting of this note might be different from the original.  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     History of colonic diverticulitis    History of colonic polyps 03/10/2008   Qualifier: Diagnosis of   By: Genie CMA LEODIS), Chick      IMO SNOMED  Dx Update Oct 2024     History of endometrial cancer 02/03/2018   Hyperlipidemia    Hypertension    Hypothyroidism 06/01/2013   hypothyroid     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   Low back pain radiating to right leg 08/18/2012   Mass of left elbow 09/14/2019   Microcytosis 09/28/2016   Mild intermittent asthma without complication 01/26/2016   Moderate persistent asthma without complication 08/07/2018   Morbid obesity (HCC) 11/09/2014   Obstructive sleep apnea 11/15/2010   Osteoarthritis 11/15/2010   B/l knees     Osteopenia 09/2017   T score -1.2 FRAX 3.2% / 0.3%, 2025 -2.0 frax 10.9, 2.2%   Perennial allergic rhinitis 06/30/2018   Personal history of chemotherapy 2015   Port catheter in place 11/15/2015   Screening Mammogram 11/05/2017   Subacromial impingement of left shoulder 12/30/2018   Thyroid  disease 06/01/2013   Type 2 diabetes mellitus with diabetic neuropathy, without long-term current use of insulin (HCC) 10/25/2016   Type 2 diabetes mellitus with obesity 03/12/2011   Past Surgical History:  Procedure Laterality Date   ABDOMINAL HYSTERECTOMY  06/23/2014   [facility], TRH/BSO   CHOLECYSTECTOMY  1991   COLONOSCOPY     DILATION AND CURETTAGE OF UTERUS  IR REMOVAL TUN ACCESS W/ PORT W/O FL MOD SED  04/30/2017   POLYPECTOMY     TUBAL LIGATION     Patient Active Problem List   Diagnosis Date Noted   Senile osteoporosis 04/20/2024   Syncope and collapse 11/19/2023   Dyspnea on exertion 11/19/2023   Palpitations 11/19/2023   Cardiac murmur 11/19/2023   Allergy    Anxiety    Arthritis    Depression    Diabetes mellitus without complication (HCC)    GERD (gastroesophageal reflux disease)    History of colonic diverticulitis    Hypertension    Acute cough 12/19/2022   Chronic rhinitis 08/21/2021   Mass of left elbow 09/14/2019   Flank pain 09/14/2019   Subacromial impingement of left shoulder 12/30/2018   Moderate persistent asthma without complication  08/07/2018   Current use of beta blocker 06/30/2018   Perennial allergic rhinitis 06/30/2018   Acute bronchitis due to other specified organisms 06/30/2018   History of endometrial cancer 02/03/2018   Screening Mammogram 11/05/2017   Osteopenia 09/2017   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   Type 2 diabetes mellitus with diabetic neuropathy, without long-term current use of insulin (HCC) 10/25/2016   Microcytosis 09/28/2016   Mild intermittent asthma without complication 01/26/2016   Port catheter in place 11/15/2015   Family history of breast cancer    Family history of ovarian cancer    Family history of prostate cancer    Family history of colon cancer    Morbid obesity (HCC) 11/09/2014   H/O total hysterectomy with bilateral salpingo-oophorectomy (BSO) 06/24/2014   Endometrial cancer (HCC) 05/20/2014   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   Personal history of chemotherapy 2015   Hypothyroidism 06/01/2013   Thyroid  disease 06/01/2013   Low back pain radiating to right leg 08/18/2012   Anemia 11/07/2011   Hyperlipidemia 06/15/2011   Type 2 diabetes mellitus with obesity 03/12/2011   Acute pain of right knee 01/27/2011   Obstructive sleep apnea 11/15/2010   Osteoarthritis 11/15/2010   Gastroesophageal reflux disease without esophagitis 09/14/2010   DIVERTICULITIS, HX OF 09/14/2010   Asymptomatic postmenopausal status 10/01/2008   History of colonic polyps 03/10/2008   Essential hypertension 08/13/2006    PCP: Frann Mabel Mt, DO   REFERRING PROVIDER: Frann Mabel Mt*   REFERRING DIAG: M54.50 (ICD-10-CM) - Acute bilateral low back pain without sciatica M25.512 (ICD-10-CM) - Acute pain of left shoulder M79.652 (ICD-10-CM) - Pain of left thigh  THERAPY DIAG:  Other low back pain  Pain in right leg  Other abnormalities of gait and mobility  Muscle weakness (generalized)  RATIONALE FOR EVALUATION AND TREATMENT: Rehabilitation  ONSET DATE:  02/21/24  NEXT MD VISIT:    SUBJECTIVE:  SUBJECTIVE STATEMENT: Pt notes the cork in her shoe has helped with pain and to level out her hips.   PAIN: Are you having pain? Yes: NPRS scale:  0/10 R low back Pain location: N/A Pain description: N/A Aggravating factors: Bending over Relieving factors: sitting with leg propped up  PERTINENT HISTORY:  Diabetes, HTN, depression, h/o endometrial CA 10 yrs ago, OA, anemia, obesity  PRECAUTIONS: Fall  RED FLAGS: None  WEIGHT BEARING RESTRICTIONS: No  FALLS:  Has patient fallen in last 6 months? Yes. Number of falls 1  LIVING ENVIRONMENT: Lives with: lives with their family and lives with their spouse Lives in: House/apartment Stairs: Yes: External: 2 steps; yes Has following equipment at home: Single point cane  OCCUPATION: retired [employer] operational officer  PLOF: Independent with gait  PATIENT GOALS: get rid of the pain   OBJECTIVE: (objective measures completed at initial evaluation unless otherwise dated)  DIAGNOSTIC FINDINGS:  EXAM: LEFT SHOULDER - 2+ VIEW   COMPARISON:  None Available.   FINDINGS: There is no evidence of fracture or dislocation. There is no evidence of arthropathy. Rounded calcification is seen over greater tuberosity suggesting calcific tendinosis. Soft tissues are unremarkable.   IMPRESSION: Possible calcific tendinosis.  No acute abnormality seen.     Electronically Signed   By: Lynwood Landy Raddle M.D.   On: 02/21/2024 14:27  EXAM: LUMBAR SPINE - COMPLETE 4+ VIEW   COMPARISON:  May 11, 2019.   FINDINGS: No fracture or significant spondylolisthesis. Minimal degenerative disc disease is noted at L2-3. Moderate degenerative disc disease is noted at L5-S1 hypertrophy of posterior facet joints is seen at  L4-5 and L5-S1 secondary to degenerative change.   IMPRESSION: Multilevel degenerative changes as noted above. No acute abnormality seen.     Electronically Signed   By: Lynwood Landy Raddle M.D.   On: 02/21/2024 14:25EXAM: LEFT KNEE - COMPLETE 4 VIEW; LEFT TIBIA AND FIBULA - 2 VIEW; LEFT FEMUR 2 VIEWS   COMPARISON:  None Available.   FINDINGS: There are no findings of fracture or dislocation. No joint effusion. Mild tricompartmental degenerative changes of the knee. Mild subcutaneous soft tissue reticulations of the lower leg.   IMPRESSION: 1. No acute fracture or dislocation. 2. Mild subcutaneous soft tissue reticulations of the lower leg, which may represent edema or contusion.     Electronically Signed   By: Limin  Xu M.D.   On: 02/21/2024 14:48  10/10/19 - Lumbar MRI IMPRESSION: 1. Facet osteoarthritis at L3-4 and below with L4-5 and L5-S1 anterolisthesis. 2. Variable disc degeneration at L2-3 and below, advanced at L5-S1. 3. Spinal stenosis that is high-grade at L4-5 and moderate at L5-S1. 4. Moderate foraminal narrowing on the left at L4-5 and bilaterally at L5-S1.10/10/19 - Lumbar MRI IMPRESSION: 1. Facet osteoarthritis at L3-4 and below with L4-5 and L5-S1 anterolisthesis. 2. Variable disc degeneration at L2-3 and below, advanced at L5-S1. 3. Spinal stenosis that is high-grade at L4-5 and moderate at L5-S1. 4. Moderate foraminal narrowing on the left at L4-5 and bilaterally at L5-S1.  PATIENT SURVEYS:  LEFS = 28/80 ODI = 29 / 50 = 58.0 % QuickDash =  38.6 / 100 = 38.6 %    SCREENING FOR RED FLAGS: Bowel or bladder incontinence: No Spinal tumors: No Cauda equina syndrome: No Compression fracture: No Abdominal aneurysm: No  COGNITION:  Overall cognitive status: Within functional limits for tasks assessed  SENSATION: WFL  POSTURE:  forward head; some R shift, lower L shoulder  PALPATION: TTP over the  R paraspinals, SI, and piriformis areas  LUMBAR  ROM:   Active  Eval  Flexion To knees;p!  Extension 70%; feels better  Right lateral flexion To knee; some p!  Left lateral flexion To mid thigh; p!  Right rotation 70%; some p!  Left rotation 50%; more p!  (Blank rows = not tested)  MUSCLE LENGTH: Hamstrings: Right SLR 70 deg; Left SLR 40 deg Thomas test: Right NT deg; Left 10 deg Hamstrings: 90/90 is 20 deg on LLE ITB: mild tight L Piriformis: mild tight L Hip flexors: min tightness  LOWER EXTREMITY ROM:     Active  Right eval Left eval  Hip flexion    Hip extension    Hip abduction    Hip adduction    Hip internal rotation    Hip external rotation    Knee flexion 110 97  Knee extension    Ankle dorsiflexion    Ankle plantarflexion    Ankle inversion    Ankle eversion    (Blank rows = not tested)  LOWER EXTREMITY MMT:    MMT Right eval Left eval  Hip flexion 5   Hip extension    Hip abduction 4   Hip adduction    Hip internal rotation 5 4-  Hip external rotation 5 4  Knee flexion 5 4+  Knee extension 5 4  Ankle dorsiflexion 5 5  Ankle plantarflexion 4 3  Ankle inversion    Ankle eversion     (Blank rows = not tested)  LUMBAR SPECIAL TESTS:  Straight leg raise test: Positive, Slump test: Positive, SI Compression/distraction test: Negative, and FABER test: Negative  FUNCTIONAL TESTS:  TBD  GAIT: Distance walked: into clinic Assistive device utilized: None Level of assistance: Complete Independence Gait pattern: limps on LLE, decreased L weight shift Comments:    TODAY'S TREATMENT:  04/21/24 Nustep L5x16min  Put rubber in L shoe to promote more neutral hip alignment Standing hip abduction x 15 BLE Standing hip extension x 15 BLE Supine ball squeeze with ab sets 2x10- 3 sec hold Bridge with RTB at knees 2x10 Sidelying clamshell RTB 2x10 R/L Seated on green pball for core stabilization:  Ab sets 10x3  Rows RTB x 10  Shoulder ext RTB x 10    04/17/24 Bike L1x59min Pelvic alignment  assessed: L leg slightly shorter than R Put cork in L shoe to promote more neutral pelvic alignment in standing IASTM with foam roll R glutes and R low back Passive R piriformis and figure 4 stretching DKTC on orange pball x 20 Bridge straight leg on orange pball x 10  LTR on orange pball x 10   04/15/24 NEUROMUSCULAR RE-EDUCATION: To improve coordination, kinesthesia, posture, and proprioception.  Quadruped cat/cow x 10 B Quadruped hip extension x 15 B Quadruped x 10 fire hydrants B- more difficult LLE, keeps foot on table Supine ab sets 10x5 Supine march + TrA 10x5 Bridge with TrA x 20  THERAPEUTIC EXERCISE: To improve strength, endurance, ROM, and flexibility.  Nustep L4x43min Seated pball rollout SB each way x 10 Seated R/L QL stretch x 30'  MANUAL THERAPY: To promote normalized muscle tension, improve joint mobility and/or for pain modulation  STM to R lumbar paraspinals, QL closer to origin on iliac crest  04/09/24 THERAPEUTIC EXERCISE: To improve strength, endurance, and ROM.  Demonstration, verbal and tactile cues throughout for technique. NuStep L5 x 6'  THERAPEUTIC ACTIVITIES: To improve functional performance.  Demonstration, verbal and tactile cues throughout for technique. SKTC  RLE x 10 Bridge x 20 BLE LTR x 20 BLE Prone lying over 2 pillows with MFR foam roll and manual x 10' Prone over 2 pillow press up x 10 Checked leg length and RLE is slightly longer Gait in clinic demonstrates increased hip ER and toe out on the LLE and fairly straight RLE  NEUROMUSCULAR RE-EDUCATION: To improve balance, posture, and proprioception. Prone over 2 pillows modified plank from knees x 2/10 Quadruped cat/cow x 20 Quadruped knee extension x 10 BLE Quadruped marching with hands x 10 BUE    04/07/24 SELF CARE: Provided education on PT POC progression.initial HEP   PATIENT EDUCATION:  Education details: HEP review and HEP update  Person educated: Patient Education method:  Explanation, Demonstration, Verbal cues, Tactile cues, and Handouts Education comprehension: verbalized understanding, verbal cues required, tactile cues required, and needs further education  HOME EXERCISE PROGRAM: Access Code: [email or web address] URL: [email or web address] Date: 04/21/2024 Prepared by: Carolee Channell  Exercises - Supine Bridge with Resistance Band  - 1 x daily - 7 x weekly - 2-3 sets - 10 reps - Clamshell with Resistance  - 1 x daily - 7 x weekly - 2-3 sets - 10 reps - Supine Lower Trunk Rotation  - 1 x daily - 7 x weekly - 3 sets - 10 reps - Supine Piriformis Stretch with Leg Straight  - 1 x daily - 7 x weekly - 1 sets - 1 reps - 1 min hold - Modified Thomas Stretch  - 1 x daily - 7 x weekly - 1 sets - 1 reps - 1 min hold - Lying Prone with 2 Pillows  - 1 x daily - 7 x weekly - 1 sets - 1 reps - 2-3 min hold - Prone Press Up  - 1 x daily - 7 x weekly - 3 sets - 10 reps - Plank on Knees  - 1 x daily - 7 x weekly - 2 sets - 10 reps - Cat Cow  - 1 x daily - 7 x weekly - 3 sets - 10 reps - Quadruped Alternating Leg Extensions  - 1 x daily - 7 x weekly - 3 sets - 10 reps  ASSESSMENT:  CLINICAL IMPRESSION: Pt able to complete all interventions. Notes pain subsiding after the insert put in last session so we used the rubber today. Continued with hip strengthening and core stabilization exercises w/o pain, cues required to correct form.  EVAL: KAVITHA LANSDALE is a 73 y.o. female who was referred to physical therapy for evaluation and treatment for LBP, L thigh pain, and L shoulder pain s/p mechanical fall on 02/21/24   Patient reports onset of low back pain and RLE beginning 02/21/24. Pain is worse with bending, twisting, and lifting.  She has been her for PT in the past for LBP w/ LLE radiculopathy and has an old MRI that shows foraminal stenosis.  Patient has deficits in lumbar ROM, L LE flexibility, LLE strength, abnormal posture, and TTP in the low back and L hip which are  interfering with ADLs and are impacting quality of life.  On Modified Oswestry patient scored 29 / 50 = 58.0 %.  Catrinia will benefit from skilled PT to address above deficits to improve mobility and activity tolerance with decreased pain interference. Patient is advised that we are going to focus on her LBP and LLE pain since this seems to be her bigger problem rather than the shoulder for now.   If the back pain resolves then  we can evaluate her shoulder pain, but we cannot adequately treat these 2 problems at the same time so we will focus on one. Patient is agreeable to this plan.  OBJECTIVE IMPAIRMENTS: Abnormal gait, difficulty walking, decreased ROM, decreased strength, and pain.   ACTIVITY LIMITATIONS: lifting, bending, and locomotion level  PARTICIPATION LIMITATIONS: cleaning and laundry  PERSONAL FACTORS: Age, Fitness, Time since onset of injury/illness/exacerbation, and 3+ comorbidities:   Diabetes, HTN, depression, h/o endometrial CA 10 yrs ago, OA, anemia, obesityare also affecting patient's functional outcome.   REHAB POTENTIAL: Good  CLINICAL DECISION MAKING: Evolving/moderate complexity  EVALUATION COMPLEXITY: Moderate   GOALS: Goals reviewed with patient? Yes  SHORT TERM GOALS: Target date: 05/05/2024     Patient will be independent with initial HEP to improve outcomes and carryover.  Baseline: 100% PT assist required for correct completion Goal status: MET- 04/21/24  2.  Patient will report 25% improvement in low back pain to improve QOL. Baseline: 10/10 worst Goal status: MET- 04/15/24  LONG TERM GOALS: Target date: 06/02/2024  Patient will be independent with ongoing/advanced HEP for self-management at home.  Baseline: no advanced HEP yet Goal status: INITIAL  2.  Patient will report 50-75% improvement in low back pain to improve QOL.  Baseline: 10/10 WORST Goal status: INITIAL  3.  Patient to demonstrate ability to achieve and maintain good spinal  alignment/posturing and body mechanics needed for daily activities. Baseline: difficulty with PPT in standing with core contraction--shaky abdominals Goal status: IN PROGRESS- 04/21/24  4.  Patient will demonstrate full pain free lumbar ROM to perform ADLs.   Baseline: Refer to above lumbar ROM table Goal status: INITIAL  5.  Patient will demonstrate improved BLE strength to >/= 5/5 for improved stability and ease of mobility. Baseline: Refer to above LE MMT table Goal status: INITIAL  6. Patient will report </= 40% on Modified Oswestry (MCID = 12%) to demonstrate improved functional ability with decreased pain interference. Baseline: 58% Goal status: INITIAL   PLAN:  PT FREQUENCY: 1-2x/week  PT DURATION: 8 weeks  PLANNED INTERVENTIONS: 02835- PT Re-evaluation, 97750- Physical Performance Testing, 97110-Therapeutic exercises, 97530- Therapeutic activity, [id number]- Neuromuscular re-education, 97535- Self Care, 02859- Manual therapy, [phone]- Gait training, [phone]- Electrical stimulation (unattended), 97016- Vasopneumatic device, [id number]- Ultrasound, [id number]- Traction (mechanical), [id number]- Ionotophoresis 4mg /ml Dexamethasone , 79439 (1-2 muscles), 20561 (3+ muscles)- Dry Needling, Patient/Family education, Balance training, Stair training, Taping, Joint mobilization, Spinal mobilization, Cryotherapy, and Moist heat  PLAN FOR NEXT SESSION: need to resubmit for insurance; Progress lumbar stabilization, lateral hip strengthening    Kewana Sanon L Khadija Thier, PTA 04/21/2024, 10:46 AM   Date of referral: see referral Referring provider: [place]  Referring diagnosis? M54.50 (ICD-10-CM) - Acute bilateral low back pain without sciatica M25.512 (ICD-10-CM) - Acute pain of left shoulder M79.652 (ICD-10-CM) - Pain of left thigh  Treatment diagnosis? (if different than referring diagnosis) R26.89, M54.59, M79.604, M62.81  What was this (referring dx) caused by? Felton Hawks of Condition: Initial Onset (within  last 3 months)   Laterality: Lt  Current Functional Measure Score: Back Index modified ODI 29 / 50 = 58.0 %  Objective measurements identify impairments when they are compared to normal values, the uninvolved extremity, and prior level of function.  [x]  Yes  []  No  Objective assessment of functional ability: Moderate functional limitations   Briefly describe symptoms: 73 y/o referred to PT from PCP for a mechanical fall on 02/21/24.   Patient reports she was trying to get away from [place] in  her neighbor's yard and turned quickly and fell on L side.   Went to the ED and had negative Xrays, but still having L low back and leg pain.   States the pain is intermittent and worse with most all activities.  States pain begins in her low back and radiates down the L lateral hip, thigh and calf all the way to her ankle.  States the pain is stabbing and that she has to keep her leg elevated in sitting positions (such as on a footrest) to relieve her pain.  States can't bend over.  Likes to walk for exercise, work in her flowers, and baking.  States bending over to reach into her lower cabinets hurts.    How did symptoms start: fall  Average pain intensity:  Last 24 hours: 6/10  Past week: 10/10  How often does the pt experience symptoms? Frequently  How much have the symptoms interfered with usual daily activities? Quite a bit  How has condition changed since care began at this facility? No change  In general, how is the patients overall health? Fair   BACK PAIN (STarT Back Screening Tool) Has pain spread down the leg(s) at some time in the last 2 weeks? yes Has there been pain in the shoulder or neck at some time in the last 2 weeks? Yes Has the pt only walked short distances because of back pain? yes Has patient dressed more slowly because of back pain in the past 2 weeks? yes Does patient think it's not safe for a person with this condition to be physically active? no Does patient  have worrying thoughts a lot of the time? no Does patient feel back pain is terrible and will never get any better? no Has patient stopped enjoying things they usually enjoy? no

## 2024-04-22 ENCOUNTER — Encounter: Payer: Self-pay | Admitting: Family Medicine

## 2024-04-23 ENCOUNTER — Encounter: Payer: Self-pay | Admitting: Obstetrics and Gynecology

## 2024-04-23 ENCOUNTER — Ambulatory Visit: Attending: Family Medicine

## 2024-04-23 DIAGNOSIS — R262 Difficulty in walking, not elsewhere classified: Secondary | ICD-10-CM | POA: Diagnosis present

## 2024-04-23 DIAGNOSIS — M5459 Other low back pain: Secondary | ICD-10-CM | POA: Diagnosis present

## 2024-04-23 DIAGNOSIS — M6281 Muscle weakness (generalized): Secondary | ICD-10-CM | POA: Diagnosis present

## 2024-04-23 DIAGNOSIS — R2689 Other abnormalities of gait and mobility: Secondary | ICD-10-CM | POA: Diagnosis present

## 2024-04-23 DIAGNOSIS — M79604 Pain in right leg: Secondary | ICD-10-CM | POA: Insufficient documentation

## 2024-04-23 DIAGNOSIS — M5416 Radiculopathy, lumbar region: Secondary | ICD-10-CM | POA: Insufficient documentation

## 2024-04-23 NOTE — Therapy (Signed)
 OUTPATIENT PHYSICAL THERAPY THORACOLUMBAR TREATMENT   Patient Name: Katelyn Fisher MRN: [id number] DOB:08/19/1950, 73 y.o., female Today's Date: 04/23/2024  END OF SESSION:  PT End of Session - 04/23/24 0947     Visit Number 6    Date for Recertification  06/02/24    PT Start Time 0933    PT Stop Time 1013    PT Time Calculation (min) 40 min    Activity Tolerance Patient tolerated treatment well;No increased pain    Behavior During Therapy [facility] for tasks assessed/performed              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   Anemia 11/07/2011   Anxiety    Arthritis    back   Asymptomatic postmenopausal status 10/01/2008   Qualifier: Diagnosis of   By: Josetta Corrigan      IMO SNOMED Dx Update Oct 2024     Chronic rhinitis 08/21/2021   Current use of beta blocker 06/30/2018   Depression    Diabetes mellitus without complication (HCC)    Endometrial cancer (HCC) 2015   Essential hypertension 08/13/2006   Qualifier: Diagnosis of   By: Nicholaus Channel         Family history of breast cancer    Family history of colon cancer    Family history of ovarian cancer    Family history of prostate cancer    Flank pain 09/14/2019   Gastroesophageal reflux disease without esophagitis 09/14/2010   Qualifier: Diagnosis of   By: Anice CMA, Darlene         GERD (gastroesophageal reflux disease)    H/O total hysterectomy with bilateral salpingo-oophorectomy (BSO) 06/24/2014   Formatting of this note might be different from the original.  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     History of colonic diverticulitis    History of colonic polyps 03/10/2008   Qualifier: Diagnosis of   By: Genie CMA LEODIS), Chick      IMO  SNOMED Dx Update Oct 2024     History of endometrial cancer 02/03/2018   Hyperlipidemia    Hypertension    Hypothyroidism 06/01/2013   hypothyroid     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   Low back pain radiating to right leg 08/18/2012   Mass of left elbow 09/14/2019   Microcytosis 09/28/2016   Mild intermittent asthma without complication 01/26/2016   Moderate persistent asthma without complication 08/07/2018   Morbid obesity (HCC) 11/09/2014   Obstructive sleep apnea 11/15/2010   Osteoarthritis 11/15/2010   B/l knees     Osteopenia 09/2017   T score -1.2 FRAX 3.2% / 0.3%, 2025 -2.0 frax 10.9, 2.2%   Perennial allergic rhinitis 06/30/2018   Personal history of chemotherapy 2015   Port catheter in place 11/15/2015   Screening Mammogram 11/05/2017   Subacromial impingement of left shoulder 12/30/2018   Thyroid  disease 06/01/2013   Type 2 diabetes mellitus with diabetic neuropathy, without long-term current use of insulin (HCC) 10/25/2016   Type 2 diabetes mellitus with obesity 03/12/2011   Past Surgical History:  Procedure Laterality Date   ABDOMINAL HYSTERECTOMY  06/23/2014   [facility], TRH/BSO   CHOLECYSTECTOMY  1991   COLONOSCOPY     DILATION AND CURETTAGE OF UTERUS  IR REMOVAL TUN ACCESS W/ PORT W/O FL MOD SED  04/30/2017   POLYPECTOMY     TUBAL LIGATION     Patient Active Problem List   Diagnosis Date Noted   Senile osteoporosis 04/20/2024   Syncope and collapse 11/19/2023   Dyspnea on exertion 11/19/2023   Palpitations 11/19/2023   Cardiac murmur 11/19/2023   Allergy    Anxiety    Arthritis    Depression    Diabetes mellitus without complication (HCC)    GERD (gastroesophageal reflux disease)    History of colonic diverticulitis    Hypertension    Acute cough 12/19/2022   Chronic rhinitis 08/21/2021   Mass of left elbow 09/14/2019   Flank pain 09/14/2019   Subacromial impingement of left shoulder 12/30/2018   Moderate persistent asthma without complication  08/07/2018   Current use of beta blocker 06/30/2018   Perennial allergic rhinitis 06/30/2018   Acute bronchitis due to other specified organisms 06/30/2018   History of endometrial cancer 02/03/2018   Screening Mammogram 11/05/2017   Osteopenia 09/2017   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   Type 2 diabetes mellitus with diabetic neuropathy, without long-term current use of insulin (HCC) 10/25/2016   Microcytosis 09/28/2016   Mild intermittent asthma without complication 01/26/2016   Port catheter in place 11/15/2015   Family history of breast cancer    Family history of ovarian cancer    Family history of prostate cancer    Family history of colon cancer    Morbid obesity (HCC) 11/09/2014   H/O total hysterectomy with bilateral salpingo-oophorectomy (BSO) 06/24/2014   Endometrial cancer (HCC) 05/20/2014   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   Personal history of chemotherapy 2015   Hypothyroidism 06/01/2013   Thyroid  disease 06/01/2013   Low back pain radiating to right leg 08/18/2012   Anemia 11/07/2011   Hyperlipidemia 06/15/2011   Type 2 diabetes mellitus with obesity 03/12/2011   Acute pain of right knee 01/27/2011   Obstructive sleep apnea 11/15/2010   Osteoarthritis 11/15/2010   Gastroesophageal reflux disease without esophagitis 09/14/2010   DIVERTICULITIS, HX OF 09/14/2010   Asymptomatic postmenopausal status 10/01/2008   History of colonic polyps 03/10/2008   Essential hypertension 08/13/2006    PCP: Frann Mabel Mt, DO   REFERRING PROVIDER: Frann Mabel Mt*   REFERRING DIAG: M54.50 (ICD-10-CM) - Acute bilateral low back pain without sciatica M25.512 (ICD-10-CM) - Acute pain of left shoulder M79.652 (ICD-10-CM) - Pain of left thigh  THERAPY DIAG:  Other low back pain  Pain in right leg  Other abnormalities of gait and mobility  Muscle weakness (generalized)  Radiculopathy, lumbar region  Difficulty in walking, not elsewhere  classified  RATIONALE FOR EVALUATION AND TREATMENT: Rehabilitation  ONSET DATE: 02/21/24  NEXT MD VISIT:    SUBJECTIVE:  SUBJECTIVE STATEMENT: Pt   PAIN: Are you having pain? Yes: NPRS scale:  3/10 R low back Pain location: N/A Pain description: N/A Aggravating factors: Bending over Relieving factors: sitting with leg propped up  PERTINENT HISTORY:  Diabetes, HTN, depression, h/o endometrial CA 10 yrs ago, OA, anemia, obesity  PRECAUTIONS: Fall  RED FLAGS: None  WEIGHT BEARING RESTRICTIONS: No  FALLS:  Has patient fallen in last 6 months? Yes. Number of falls 1  LIVING ENVIRONMENT: Lives with: lives with their family and lives with their spouse Lives in: House/apartment Stairs: Yes: External: 2 steps; yes Has following equipment at home: Single point cane  OCCUPATION: retired [employer] operational officer  PLOF: Independent with gait  PATIENT GOALS: get rid of the pain   OBJECTIVE: (objective measures completed at initial evaluation unless otherwise dated)  DIAGNOSTIC FINDINGS:  EXAM: LEFT SHOULDER - 2+ VIEW   COMPARISON:  None Available.   FINDINGS: There is no evidence of fracture or dislocation. There is no evidence of arthropathy. Rounded calcification is seen over greater tuberosity suggesting calcific tendinosis. Soft tissues are unremarkable.   IMPRESSION: Possible calcific tendinosis.  No acute abnormality seen.     Electronically Signed   By: Lynwood Landy Raddle M.D.   On: 02/21/2024 14:27  EXAM: LUMBAR SPINE - COMPLETE 4+ VIEW   COMPARISON:  May 11, 2019.   FINDINGS: No fracture or significant spondylolisthesis. Minimal degenerative disc disease is noted at L2-3. Moderate degenerative disc disease is noted at L5-S1 hypertrophy of posterior facet joints is  seen at L4-5 and L5-S1 secondary to degenerative change.   IMPRESSION: Multilevel degenerative changes as noted above. No acute abnormality seen.     Electronically Signed   By: Lynwood Landy Raddle M.D.   On: 02/21/2024 14:25EXAM: LEFT KNEE - COMPLETE 4 VIEW; LEFT TIBIA AND FIBULA - 2 VIEW; LEFT FEMUR 2 VIEWS   COMPARISON:  None Available.   FINDINGS: There are no findings of fracture or dislocation. No joint effusion. Mild tricompartmental degenerative changes of the knee. Mild subcutaneous soft tissue reticulations of the lower leg.   IMPRESSION: 1. No acute fracture or dislocation. 2. Mild subcutaneous soft tissue reticulations of the lower leg, which may represent edema or contusion.     Electronically Signed   By: Limin  Xu M.D.   On: 02/21/2024 14:48  10/10/19 - Lumbar MRI IMPRESSION: 1. Facet osteoarthritis at L3-4 and below with L4-5 and L5-S1 anterolisthesis. 2. Variable disc degeneration at L2-3 and below, advanced at L5-S1. 3. Spinal stenosis that is high-grade at L4-5 and moderate at L5-S1. 4. Moderate foraminal narrowing on the left at L4-5 and bilaterally at L5-S1.10/10/19 - Lumbar MRI IMPRESSION: 1. Facet osteoarthritis at L3-4 and below with L4-5 and L5-S1 anterolisthesis. 2. Variable disc degeneration at L2-3 and below, advanced at L5-S1. 3. Spinal stenosis that is high-grade at L4-5 and moderate at L5-S1. 4. Moderate foraminal narrowing on the left at L4-5 and bilaterally at L5-S1.  PATIENT SURVEYS:  LEFS = 28/80 ODI = 29 / 50 = 58.0 % QuickDash =  38.6 / 100 = 38.6 %    SCREENING FOR RED FLAGS: Bowel or bladder incontinence: No Spinal tumors: No Cauda equina syndrome: No Compression fracture: No Abdominal aneurysm: No  COGNITION:  Overall cognitive status: Within functional limits for tasks assessed  SENSATION: WFL  POSTURE:  forward head; some R shift, lower L shoulder  PALPATION: TTP over the R paraspinals, SI, and piriformis  areas  LUMBAR ROM:   Active  Eval  Flexion To knees;p!  Extension 70%; feels better  Right lateral flexion To knee; some p!  Left lateral flexion To mid thigh; p!  Right rotation 70%; some p!  Left rotation 50%; more p!  (Blank rows = not tested)  MUSCLE LENGTH: Hamstrings: Right SLR 70 deg; Left SLR 40 deg Thomas test: Right NT deg; Left 10 deg Hamstrings: 90/90 is 20 deg on LLE ITB: mild tight L Piriformis: mild tight L Hip flexors: min tightness  LOWER EXTREMITY ROM:     Active  Right eval Left eval  Hip flexion    Hip extension    Hip abduction    Hip adduction    Hip internal rotation    Hip external rotation    Knee flexion 110 97  Knee extension    Ankle dorsiflexion    Ankle plantarflexion    Ankle inversion    Ankle eversion    (Blank rows = not tested)  LOWER EXTREMITY MMT:    MMT Right eval Left eval  Hip flexion 5   Hip extension    Hip abduction 4   Hip adduction    Hip internal rotation 5 4-  Hip external rotation 5 4  Knee flexion 5 4+  Knee extension 5 4  Ankle dorsiflexion 5 5  Ankle plantarflexion 4 3  Ankle inversion    Ankle eversion     (Blank rows = not tested)  LUMBAR SPECIAL TESTS:  Straight leg raise test: Positive, Slump test: Positive, SI Compression/distraction test: Negative, and FABER test: Negative  FUNCTIONAL TESTS:  TBD  GAIT: Distance walked: into clinic Assistive device utilized: None Level of assistance: Complete Independence Gait pattern: limps on LLE, decreased L weight shift Comments:    TODAY'S TREATMENT:  04/23/24 Nustep L5x26min All exercises seated on green pball:  Bouncing x 1 min Ab sets 15x3 LAQ x 10 BLE March x 10 BLE Lateral weight shifts x 20 Fwd/post weight shift x 20     04/21/24 Nustep L5x72min  Put rubber in L shoe to promote more neutral hip alignment Standing hip abduction x 15 BLE Standing hip extension x 15 BLE Supine ball squeeze with ab sets 2x10- 3 sec hold Bridge with RTB  at knees 2x10 Sidelying clamshell RTB 2x10 R/L Seated on green pball for core stabilization:  Ab sets 10x3  Rows RTB x 10  Shoulder ext RTB x 10    04/17/24 Bike L1x41min Pelvic alignment assessed: L leg slightly shorter than R Put cork in L shoe to promote more neutral pelvic alignment in standing IASTM with foam roll R glutes and R low back Passive R piriformis and figure 4 stretching DKTC on orange pball x 20 Bridge straight leg on orange pball x 10  LTR on orange pball x 10   04/15/24 NEUROMUSCULAR RE-EDUCATION: To improve coordination, kinesthesia, posture, and proprioception.  Quadruped cat/cow x 10 B Quadruped hip extension x 15 B Quadruped x 10 fire hydrants B- more difficult LLE, keeps foot on table Supine ab sets 10x5 Supine march + TrA 10x5 Bridge with TrA x 20  THERAPEUTIC EXERCISE: To improve strength, endurance, ROM, and flexibility.  Nustep L4x64min Seated pball rollout SB each way x 10 Seated R/L QL stretch x 30'  MANUAL THERAPY: To promote normalized muscle tension, improve joint mobility and/or for pain modulation  STM to R lumbar paraspinals, QL closer to origin on iliac crest  04/09/24 THERAPEUTIC EXERCISE: To improve strength, endurance, and ROM.  Demonstration, verbal and tactile cues throughout  for technique. NuStep L5 x 6'  THERAPEUTIC ACTIVITIES: To improve functional performance.  Demonstration, verbal and tactile cues throughout for technique. SKTC RLE x 10 Bridge x 20 BLE LTR x 20 BLE Prone lying over 2 pillows with MFR foam roll and manual x 10' Prone over 2 pillow press up x 10 Checked leg length and RLE is slightly longer Gait in clinic demonstrates increased hip ER and toe out on the LLE and fairly straight RLE  NEUROMUSCULAR RE-EDUCATION: To improve balance, posture, and proprioception. Prone over 2 pillows modified plank from knees x 2/10 Quadruped cat/cow x 20 Quadruped knee extension x 10 BLE Quadruped marching with hands x 10  BUE    04/07/24 SELF CARE: Provided education on PT POC progression.initial HEP   PATIENT EDUCATION:  Education details: HEP review and HEP update  Person educated: Patient Education method: Explanation, Demonstration, Verbal cues, Tactile cues, and Handouts Education comprehension: verbalized understanding, verbal cues required, tactile cues required, and needs further education  HOME EXERCISE PROGRAM: Access Code: [email or web address] URL: [email or web address] Date: 04/21/2024 Prepared by: Sol Gaskins  Exercises - Supine Bridge with Resistance Band  - 1 x daily - 7 x weekly - 2-3 sets - 10 reps - Clamshell with Resistance  - 1 x daily - 7 x weekly - 2-3 sets - 10 reps - Supine Lower Trunk Rotation  - 1 x daily - 7 x weekly - 3 sets - 10 reps - Supine Piriformis Stretch with Leg Straight  - 1 x daily - 7 x weekly - 1 sets - 1 reps - 1 min hold - Modified Thomas Stretch  - 1 x daily - 7 x weekly - 1 sets - 1 reps - 1 min hold - Lying Prone with 2 Pillows  - 1 x daily - 7 x weekly - 1 sets - 1 reps - 2-3 min hold - Prone Press Up  - 1 x daily - 7 x weekly - 3 sets - 10 reps - Plank on Knees  - 1 x daily - 7 x weekly - 2 sets - 10 reps - Cat Cow  - 1 x daily - 7 x weekly - 3 sets - 10 reps - Quadruped Alternating Leg Extensions  - 1 x daily - 7 x weekly - 3 sets - 10 reps  ASSESSMENT:  CLINICAL IMPRESSION: Pt notes 40% improvement in pain LB and radiating pain down LLE. Shows improvement in Modified Oswestry index by 12%. Continued focus on core stabilization, cuing and instrucion provided throughout session. She notes less radiating pain in LLE and able to perform ADLs more easily now. She would continue to benefit form skilled therapy to continue improve her hip strength and core stability to further improve function.     EVAL: Katelyn Fisher is a 73 y.o. female who was referred to physical therapy for evaluation and treatment for LBP, L thigh pain, and L shoulder pain s/p  mechanical fall on 02/21/24   Patient reports onset of low back pain and RLE beginning 02/21/24. Pain is worse with bending, twisting, and lifting.  She has been her for PT in the past for LBP w/ LLE radiculopathy and has an old MRI that shows foraminal stenosis.  Patient has deficits in lumbar ROM, L LE flexibility, LLE strength, abnormal posture, and TTP in the low back and L hip which are interfering with ADLs and are impacting quality of life.  On Modified Oswestry patient scored 29 / 50 = 58.0 %.  Sunita  will benefit from skilled PT to address above deficits to improve mobility and activity tolerance with decreased pain interference. Patient is advised that we are going to focus on her LBP and LLE pain since this seems to be her bigger problem rather than the shoulder for now.   If the back pain resolves then we can evaluate her shoulder pain, but we cannot adequately treat these 2 problems at the same time so we will focus on one. Patient is agreeable to this plan.  OBJECTIVE IMPAIRMENTS: Abnormal gait, difficulty walking, decreased ROM, decreased strength, and pain.   ACTIVITY LIMITATIONS: lifting, bending, and locomotion level  PARTICIPATION LIMITATIONS: cleaning and laundry  PERSONAL FACTORS: Age, Fitness, Time since onset of injury/illness/exacerbation, and 3+ comorbidities:   Diabetes, HTN, depression, h/o endometrial CA 10 yrs ago, OA, anemia, obesityare also affecting patient's functional outcome.   REHAB POTENTIAL: Good  CLINICAL DECISION MAKING: Evolving/moderate complexity  EVALUATION COMPLEXITY: Moderate   GOALS: Goals reviewed with patient? Yes  SHORT TERM GOALS: Target date: 05/05/2024     Patient will be independent with initial HEP to improve outcomes and carryover.  Baseline: 100% PT assist required for correct completion Goal status: MET- 04/21/24  2.  Patient will report 25% improvement in low back pain to improve QOL. Baseline: 10/10 worst Goal status: MET-  04/15/24  LONG TERM GOALS: Target date: 06/02/2024  Patient will be independent with ongoing/advanced HEP for self-management at home.  Baseline: no advanced HEP yet Goal status: INITIAL  2.  Patient will report 50-75% improvement in low back pain to improve QOL.  Baseline: 10/10 WORST Goal status: IN PROGRESS- 04/23/24 40% improvement   3.  Patient to demonstrate ability to achieve and maintain good spinal alignment/posturing and body mechanics needed for daily activities. Baseline: difficulty with PPT in standing with core contraction--shaky abdominals Goal status: IN PROGRESS- 04/21/24  4.  Patient will demonstrate full pain free lumbar ROM to perform ADLs.   Baseline: Refer to above lumbar ROM table Goal status: INITIAL  5.  Patient will demonstrate improved BLE strength to >/= 5/5 for improved stability and ease of mobility. Baseline: Refer to above LE MMT table Goal status: INITIAL  6. Patient will report </= 40% on Modified Oswestry (MCID = 12%) to demonstrate improved functional ability with decreased pain interference. Baseline: 58% Goal status: IN PROGRESS- 04/23/24 23 / 50 = 46.0 %   PLAN:  PT FREQUENCY: 1-2x/week  PT DURATION: 8 weeks  PLANNED INTERVENTIONS: 97164- PT Re-evaluation, 97750- Physical Performance Testing, 97110-Therapeutic exercises, 97530- Therapeutic activity, [id number]- Neuromuscular re-education, 97535- Self Care, 02859- Manual therapy, [phone]- Gait training, [phone]- Electrical stimulation (unattended), 97016- Vasopneumatic device, [id number]- Ultrasound, [id number]- Traction (mechanical), [id number]- Ionotophoresis 4mg /ml Dexamethasone , 79439 (1-2 muscles), 20561 (3+ muscles)- Dry Needling, Patient/Family education, Balance training, Stair training, Taping, Joint mobilization, Spinal mobilization, Cryotherapy, and Moist heat  PLAN FOR NEXT SESSION: Progress lumbar stabilization, lateral hip strengthening    Sol LITTIE Gaskins, PTA 04/23/2024, 10:35 AM   Date of referral:  see referral Referring provider: [place]  Referring diagnosis? M54.50 (ICD-10-CM) - Acute bilateral low back pain without sciatica M25.512 (ICD-10-CM) - Acute pain of left shoulder M79.652 (ICD-10-CM) - Pain of left thigh  Treatment diagnosis? (if different than referring diagnosis) R26.89, M54.59, M79.604, M62.81  What was this (referring dx) caused by? Felton Hawks of Condition: Initial Onset (within last 3 months)   Laterality: Lt  Current Functional Measure Score: Back Index modified ODI 23 / 50 = 46.0 %  Objective measurements  identify impairments when they are compared to normal values, the uninvolved extremity, and prior level of function.  [x]  Yes  []  No  Objective assessment of functional ability: Moderate functional limitations   Briefly describe symptoms: 73 y/o referred to PT from PCP for a mechanical fall on 02/21/24.   Patient reports she was trying to get away from [place] in her neighbor's yard and turned quickly and fell on L side.   Went to the ED and had negative Xrays, but still having L low back and leg pain.   States the pain is intermittent and worse with most all activities.  States pain begins in her low back and radiates down the L lateral hip, thigh and calf all the way to her ankle.  States the pain is stabbing and that she has to keep her leg elevated in sitting positions (such as on a footrest) to relieve her pain.  States can't bend over.  Likes to walk for exercise, work in her flowers, and baking.  States bending over to reach into her lower cabinets hurts.    How did symptoms start: fall  Average pain intensity:  Last 24 hours: 4/10  Past week: 4/10  How often does the pt experience symptoms? Intermittently  How much have the symptoms interfered with usual daily activities? Moderately  How has condition changed since care began at this facility? Better  In general, how is the patients overall health? Fair   BACK PAIN (STarT Back Screening  Tool) Has pain spread down the leg(s) at some time in the last 2 weeks? Yes Has there been pain in the shoulder or neck at some time in the last 2 weeks? Yes Has the pt only walked short distances because of back pain? no Has patient dressed more slowly because of back pain in the past 2 weeks? no Does patient think it's not safe for a person with this condition to be physically active? no Does patient have worrying thoughts a lot of the time? no Does patient feel back pain is terrible and will never get any better? no Has patient stopped enjoying things they usually enjoy? no

## 2024-04-28 ENCOUNTER — Ambulatory Visit: Admitting: Rehabilitation

## 2024-04-28 ENCOUNTER — Encounter: Payer: Self-pay | Admitting: Rehabilitation

## 2024-04-28 DIAGNOSIS — M6281 Muscle weakness (generalized): Secondary | ICD-10-CM

## 2024-04-28 DIAGNOSIS — M5459 Other low back pain: Secondary | ICD-10-CM | POA: Diagnosis not present

## 2024-04-28 DIAGNOSIS — M79604 Pain in right leg: Secondary | ICD-10-CM

## 2024-04-28 DIAGNOSIS — R2689 Other abnormalities of gait and mobility: Secondary | ICD-10-CM

## 2024-04-28 NOTE — Therapy (Signed)
 OUTPATIENT PHYSICAL THERAPY THORACOLUMBAR TREATMENT   Patient Name: Katelyn Fisher MRN: [id number] DOB:08/01/1950, 73 y.o., female Today's Date: 04/28/2024  END OF SESSION:  PT End of Session - 04/28/24 0940     Visit Number 7    Date for Recertification  06/02/24    PT Start Time 0934    PT Stop Time 1015    PT Time Calculation (min) 41 min    Activity Tolerance Patient tolerated treatment well;No increased pain    Behavior During Therapy [facility] for tasks assessed/performed              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   Anemia 11/07/2011   Anxiety    Arthritis    back   Asymptomatic postmenopausal status 10/01/2008   Qualifier: Diagnosis of   By: Josetta Corrigan      IMO SNOMED Dx Update Oct 2024     Chronic rhinitis 08/21/2021   Current use of beta blocker 06/30/2018   Depression    Diabetes mellitus without complication (HCC)    Endometrial cancer (HCC) 2015   Essential hypertension 08/13/2006   Qualifier: Diagnosis of   By: Nicholaus Channel         Family history of breast cancer    Family history of colon cancer    Family history of ovarian cancer    Family history of prostate cancer    Flank pain 09/14/2019   Gastroesophageal reflux disease without esophagitis 09/14/2010   Qualifier: Diagnosis of   By: Anice CMA, Darlene         GERD (gastroesophageal reflux disease)    H/O total hysterectomy with bilateral salpingo-oophorectomy (BSO) 06/24/2014   Formatting of this note might be different from the original.  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     History of colonic diverticulitis    History of colonic polyps 03/10/2008   Qualifier: Diagnosis of   By: Genie CMA LEODIS), Chick      IMO  SNOMED Dx Update Oct 2024     History of endometrial cancer 02/03/2018   Hyperlipidemia    Hypertension    Hypothyroidism 06/01/2013   hypothyroid     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   Low back pain radiating to right leg 08/18/2012   Mass of left elbow 09/14/2019   Microcytosis 09/28/2016   Mild intermittent asthma without complication 01/26/2016   Moderate persistent asthma without complication 08/07/2018   Morbid obesity (HCC) 11/09/2014   Obstructive sleep apnea 11/15/2010   Osteoarthritis 11/15/2010   B/l knees     Osteopenia 09/2017   T score -1.2 FRAX 3.2% / 0.3%, 2025 -2.0 frax 10.9, 2.2%   Perennial allergic rhinitis 06/30/2018   Personal history of chemotherapy 2015   Port catheter in place 11/15/2015   Screening Mammogram 11/05/2017   Subacromial impingement of left shoulder 12/30/2018   Thyroid  disease 06/01/2013   Type 2 diabetes mellitus with diabetic neuropathy, without long-term current use of insulin (HCC) 10/25/2016   Type 2 diabetes mellitus with obesity 03/12/2011   Past Surgical History:  Procedure Laterality Date   ABDOMINAL HYSTERECTOMY  06/23/2014   [facility], TRH/BSO   CHOLECYSTECTOMY  1991   COLONOSCOPY     DILATION AND CURETTAGE OF UTERUS  IR REMOVAL TUN ACCESS W/ PORT W/O FL MOD SED  04/30/2017   POLYPECTOMY     TUBAL LIGATION     Patient Active Problem List   Diagnosis Date Noted   Senile osteoporosis 04/20/2024   Syncope and collapse 11/19/2023   Dyspnea on exertion 11/19/2023   Palpitations 11/19/2023   Cardiac murmur 11/19/2023   Allergy    Anxiety    Arthritis    Depression    Diabetes mellitus without complication (HCC)    GERD (gastroesophageal reflux disease)    History of colonic diverticulitis    Hypertension    Acute cough 12/19/2022   Chronic rhinitis 08/21/2021   Mass of left elbow 09/14/2019   Flank pain 09/14/2019   Subacromial impingement of left shoulder 12/30/2018   Moderate persistent asthma without complication  08/07/2018   Current use of beta blocker 06/30/2018   Perennial allergic rhinitis 06/30/2018   Acute bronchitis due to other specified organisms 06/30/2018   History of endometrial cancer 02/03/2018   Screening Mammogram 11/05/2017   Osteopenia 09/2017   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   Type 2 diabetes mellitus with diabetic neuropathy, without long-term current use of insulin (HCC) 10/25/2016   Microcytosis 09/28/2016   Mild intermittent asthma without complication 01/26/2016   Port catheter in place 11/15/2015   Family history of breast cancer    Family history of ovarian cancer    Family history of prostate cancer    Family history of colon cancer    Morbid obesity (HCC) 11/09/2014   H/O total hysterectomy with bilateral salpingo-oophorectomy (BSO) 06/24/2014   Endometrial cancer (HCC) 05/20/2014   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   Personal history of chemotherapy 2015   Hypothyroidism 06/01/2013   Thyroid  disease 06/01/2013   Low back pain radiating to right leg 08/18/2012   Anemia 11/07/2011   Hyperlipidemia 06/15/2011   Type 2 diabetes mellitus with obesity 03/12/2011   Acute pain of right knee 01/27/2011   Obstructive sleep apnea 11/15/2010   Osteoarthritis 11/15/2010   Gastroesophageal reflux disease without esophagitis 09/14/2010   DIVERTICULITIS, HX OF 09/14/2010   Asymptomatic postmenopausal status 10/01/2008   History of colonic polyps 03/10/2008   Essential hypertension 08/13/2006    PCP: Frann Mabel Mt, DO   REFERRING PROVIDER: Frann Mabel Mt*   REFERRING DIAG: M54.50 (ICD-10-CM) - Acute bilateral low back pain without sciatica M25.512 (ICD-10-CM) - Acute pain of left shoulder M79.652 (ICD-10-CM) - Pain of left thigh  THERAPY DIAG:  Other low back pain  Pain in right leg  Other abnormalities of gait and mobility  Muscle weakness (generalized)  RATIONALE FOR EVALUATION AND TREATMENT: Rehabilitation  ONSET DATE:  02/21/24  NEXT MD VISIT:    SUBJECTIVE:  SUBJECTIVE STATEMENT: Patient is very pleased with her progress to date with PT.  Feels she is getting steadily better.    PAIN: Are you having pain? Yes: NPRS scale:  3/10 R low back Pain location: N/A Pain description: N/A Aggravating factors: Bending over Relieving factors: sitting with leg propped up  PERTINENT HISTORY:  Diabetes, HTN, depression, h/o endometrial CA 10 yrs ago, OA, anemia, obesity  PRECAUTIONS: Fall  RED FLAGS: None  WEIGHT BEARING RESTRICTIONS: No  FALLS:  Has patient fallen in last 6 months? Yes. Number of falls 1  LIVING ENVIRONMENT: Lives with: lives with their family and lives with their spouse Lives in: House/apartment Stairs: Yes: External: 2 steps; yes Has following equipment at home: Single point cane  OCCUPATION: retired [employer] operational officer  PLOF: Independent with gait  PATIENT GOALS: get rid of the pain   OBJECTIVE: (objective measures completed at initial evaluation unless otherwise dated)  DIAGNOSTIC FINDINGS:  EXAM: LEFT SHOULDER - 2+ VIEW   COMPARISON:  None Available.   FINDINGS: There is no evidence of fracture or dislocation. There is no evidence of arthropathy. Rounded calcification is seen over greater tuberosity suggesting calcific tendinosis. Soft tissues are unremarkable.   IMPRESSION: Possible calcific tendinosis.  No acute abnormality seen.     Electronically Signed   By: Lynwood Landy Raddle M.D.   On: 02/21/2024 14:27  EXAM: LUMBAR SPINE - COMPLETE 4+ VIEW   COMPARISON:  May 11, 2019.   FINDINGS: No fracture or significant spondylolisthesis. Minimal degenerative disc disease is noted at L2-3. Moderate degenerative disc disease is noted at L5-S1 hypertrophy of posterior facet  joints is seen at L4-5 and L5-S1 secondary to degenerative change.   IMPRESSION: Multilevel degenerative changes as noted above. No acute abnormality seen.     Electronically Signed   By: Lynwood Landy Raddle M.D.   On: 02/21/2024 14:25EXAM: LEFT KNEE - COMPLETE 4 VIEW; LEFT TIBIA AND FIBULA - 2 VIEW; LEFT FEMUR 2 VIEWS   COMPARISON:  None Available.   FINDINGS: There are no findings of fracture or dislocation. No joint effusion. Mild tricompartmental degenerative changes of the knee. Mild subcutaneous soft tissue reticulations of the lower leg.   IMPRESSION: 1. No acute fracture or dislocation. 2. Mild subcutaneous soft tissue reticulations of the lower leg, which may represent edema or contusion.     Electronically Signed   By: Limin  Xu M.D.   On: 02/21/2024 14:48  10/10/19 - Lumbar MRI IMPRESSION: 1. Facet osteoarthritis at L3-4 and below with L4-5 and L5-S1 anterolisthesis. 2. Variable disc degeneration at L2-3 and below, advanced at L5-S1. 3. Spinal stenosis that is high-grade at L4-5 and moderate at L5-S1. 4. Moderate foraminal narrowing on the left at L4-5 and bilaterally at L5-S1.10/10/19 - Lumbar MRI IMPRESSION: 1. Facet osteoarthritis at L3-4 and below with L4-5 and L5-S1 anterolisthesis. 2. Variable disc degeneration at L2-3 and below, advanced at L5-S1. 3. Spinal stenosis that is high-grade at L4-5 and moderate at L5-S1. 4. Moderate foraminal narrowing on the left at L4-5 and bilaterally at L5-S1.  PATIENT SURVEYS:  LEFS = 28/80 ODI = 29 / 50 = 58.0 % QuickDash =  38.6 / 100 = 38.6 %    SCREENING FOR RED FLAGS: Bowel or bladder incontinence: No Spinal tumors: No Cauda equina syndrome: No Compression fracture: No Abdominal aneurysm: No  COGNITION:  Overall cognitive status: Within functional limits for tasks assessed  SENSATION: WFL  POSTURE:  forward head; some R shift, lower L shoulder  PALPATION: TTP  over the R paraspinals, SI, and piriformis  areas  LUMBAR ROM:   Active  Eval 04/28/24  Flexion To knees;p! To mid shins; some p!  Extension 70%; feels better 90%;  better pain  Right lateral flexion To knee; some p! Just below knee  Left lateral flexion To mid thigh; p! To knee  Right rotation 70%; some p! 85%  Left rotation 50%; more p! 85%  (Blank rows = not tested)  MUSCLE LENGTH: Hamstrings: Right SLR 70 deg; Left SLR 40 deg Thomas test: Right NT deg; Left 10 deg Hamstrings: 90/90 is 20 deg on LLE ITB: mild tight L Piriformis: mild tight L Hip flexors: min tightness  LOWER EXTREMITY ROM:     Active  Right eval Left eval  Hip flexion    Hip extension    Hip abduction    Hip adduction    Hip internal rotation    Hip external rotation    Knee flexion 110 97  Knee extension    Ankle dorsiflexion    Ankle plantarflexion    Ankle inversion    Ankle eversion    (Blank rows = not tested)  LOWER EXTREMITY MMT:    MMT Right eval Left eval RLE 04/28/24 LLE 04/28/24  Hip flexion 5  5 5   Hip extension   4- 3  Hip abduction 4  4 4   Hip adduction      Hip internal rotation 5 4- 5 4  Hip external rotation 5 4 5 5   Knee flexion 5 4+ 5 5  Knee extension 5 4 5 5   Ankle dorsiflexion 5 5 5 5   Ankle plantarflexion 4 3    Ankle inversion      Ankle eversion       (Blank rows = not tested)  LUMBAR SPECIAL TESTS:  Straight leg raise test: Positive, Slump test: Positive, SI Compression/distraction test: Negative, and FABER test: Negative  FUNCTIONAL TESTS:  TBD  GAIT: Distance walked: into clinic Assistive device utilized: None Level of assistance: Complete Independence Gait pattern: limps on LLE, decreased L weight shift Comments:    TODAY'S TREATMENT:  04/28/24 THERAPEUTIC EXERCISE: To improve strength and endurance.  Demonstration, verbal and tactile cues throughout for technique. NuStep L5 x 7'  THERAPEUTIC ACTIVITIES: To improve functional performance.  Demonstration, verbal and tactile cues throughout  for technique. SL fire hydrants x 2/10 BLE Prone on elbows x 3' Prone hip ext x 2/10 BLE Rechecked strength and lumbar ROM  NEUROMUSCULAR RE-EDUCATION: To improve balance, kinesthesia, and posture. Supine bridge w/ blue TB hip ER stabilization x 2/10 BLE Prone plank from knees x 10  Seated Large swiss ball:  Bounce x 1'  Alternate arm raise and LAQ x 2/10 BLE  Alternate arm raise and leg march x 2/10 BLE  04/23/24 Nustep L5x23min All exercises seated on green pball:  Bouncing x 1 min Ab sets 15x3 LAQ x 10 BLE March x 10 BLE Lateral weight shifts x 20 Fwd/post weight shift x 20     04/21/24 Nustep L5x90min  Put rubber in L shoe to promote more neutral hip alignment Standing hip abduction x 15 BLE Standing hip extension x 15 BLE Supine ball squeeze with ab sets 2x10- 3 sec hold Bridge with RTB at knees 2x10 Sidelying clamshell RTB 2x10 R/L Seated on green pball for core stabilization:  Ab sets 10x3  Rows RTB x 10  Shoulder ext RTB x 10    04/17/24 Bike L1x19min Pelvic alignment assessed: L leg slightly  shorter than R Put cork in L shoe to promote more neutral pelvic alignment in standing IASTM with foam roll R glutes and R low back Passive R piriformis and figure 4 stretching DKTC on orange pball x 20 Bridge straight leg on orange pball x 10  LTR on orange pball x 10   04/15/24 NEUROMUSCULAR RE-EDUCATION: To improve coordination, kinesthesia, posture, and proprioception.  Quadruped cat/cow x 10 B Quadruped hip extension x 15 B Quadruped x 10 fire hydrants B- more difficult LLE, keeps foot on table Supine ab sets 10x5 Supine march + TrA 10x5 Bridge with TrA x 20  THERAPEUTIC EXERCISE: To improve strength, endurance, ROM, and flexibility.  Nustep L4x43min Seated pball rollout SB each way x 10 Seated R/L QL stretch x 30'  MANUAL THERAPY: To promote normalized muscle tension, improve joint mobility and/or for pain modulation  STM to R lumbar paraspinals, QL  closer to origin on iliac crest  04/09/24 THERAPEUTIC EXERCISE: To improve strength, endurance, and ROM.  Demonstration, verbal and tactile cues throughout for technique. NuStep L5 x 6'  THERAPEUTIC ACTIVITIES: To improve functional performance.  Demonstration, verbal and tactile cues throughout for technique. SKTC RLE x 10 Bridge x 20 BLE LTR x 20 BLE Prone lying over 2 pillows with MFR foam roll and manual x 10' Prone over 2 pillow press up x 10 Checked leg length and RLE is slightly longer Gait in clinic demonstrates increased hip ER and toe out on the LLE and fairly straight RLE  NEUROMUSCULAR RE-EDUCATION: To improve balance, posture, and proprioception. Prone over 2 pillows modified plank from knees x 2/10 Quadruped cat/cow x 20 Quadruped knee extension x 10 BLE Quadruped marching with hands x 10 BUE    04/07/24 SELF CARE: Provided education on PT POC progression.initial HEP   PATIENT EDUCATION:  Education details: HEP review and HEP update  Person educated: Patient Education method: Explanation, Demonstration, Verbal cues, Tactile cues, and Handouts Education comprehension: verbalized understanding, verbal cues required, tactile cues required, and needs further education  HOME EXERCISE PROGRAM: Access Code: [email or web address] URL: [email or web address] Date: 04/21/2024 Prepared by: Braylin Clark  Exercises - Supine Bridge with Resistance Band  - 1 x daily - 7 x weekly - 2-3 sets - 10 reps - Clamshell with Resistance  - 1 x daily - 7 x weekly - 2-3 sets - 10 reps - Supine Lower Trunk Rotation  - 1 x daily - 7 x weekly - 3 sets - 10 reps - Supine Piriformis Stretch with Leg Straight  - 1 x daily - 7 x weekly - 1 sets - 1 reps - 1 min hold - Modified Thomas Stretch  - 1 x daily - 7 x weekly - 1 sets - 1 reps - 1 min hold - Lying Prone with 2 Pillows  - 1 x daily - 7 x weekly - 1 sets - 1 reps - 2-3 min hold - Prone Press Up  - 1 x daily - 7 x weekly - 3 sets - 10  reps - Plank on Knees  - 1 x daily - 7 x weekly - 2 sets - 10 reps - Cat Cow  - 1 x daily - 7 x weekly - 3 sets - 10 reps - Quadruped Alternating Leg Extensions  - 1 x daily - 7 x weekly - 3 sets - 10 reps  ASSESSMENT:  CLINICAL IMPRESSION: Patient reports 50% subjective improvement today in her symptoms.  She is very please with progress.   She demonstrates  improvements in her strength vs. Initial visit, but still lacks hip abd and extension strength which are imperative to back stability.   Lumbar ROM is also improving and not as painful as initially.   PT is required to address strength, pain, functional deficits.   Continue per POC     EVAL: Katelyn Fisher is a 73 y.o. female who was referred to physical therapy for evaluation and treatment for LBP, L thigh pain, and L shoulder pain s/p mechanical fall on 02/21/24   Patient reports onset of low back pain and RLE beginning 02/21/24. Pain is worse with bending, twisting, and lifting.  She has been her for PT in the past for LBP w/ LLE radiculopathy and has an old MRI that shows foraminal stenosis.  Patient has deficits in lumbar ROM, L LE flexibility, LLE strength, abnormal posture, and TTP in the low back and L hip which are interfering with ADLs and are impacting quality of life.  On Modified Oswestry patient scored 29 / 50 = 58.0 %.  Avila will benefit from skilled PT to address above deficits to improve mobility and activity tolerance with decreased pain interference. Patient is advised that we are going to focus on her LBP and LLE pain since this seems to be her bigger problem rather than the shoulder for now.   If the back pain resolves then we can evaluate her shoulder pain, but we cannot adequately treat these 2 problems at the same time so we will focus on one. Patient is agreeable to this plan.  OBJECTIVE IMPAIRMENTS: Abnormal gait, difficulty walking, decreased ROM, decreased strength, and pain.   ACTIVITY LIMITATIONS: lifting, bending,  and locomotion level  PARTICIPATION LIMITATIONS: cleaning and laundry  PERSONAL FACTORS: Age, Fitness, Time since onset of injury/illness/exacerbation, and 3+ comorbidities:   Diabetes, HTN, depression, h/o endometrial CA 10 yrs ago, OA, anemia, obesityare also affecting patient's functional outcome.   REHAB POTENTIAL: Good  CLINICAL DECISION MAKING: Evolving/moderate complexity  EVALUATION COMPLEXITY: Moderate   GOALS: Goals reviewed with patient? Yes  SHORT TERM GOALS: Target date: 05/05/2024     Patient will be independent with initial HEP to improve outcomes and carryover.  Baseline: 100% PT assist required for correct completion Goal status: MET- 04/21/24  2.  Patient will report 25% improvement in low back pain to improve QOL. Baseline: 10/10 worst Goal status: MET- 04/15/24  LONG TERM GOALS: Target date: 06/02/2024  Patient will be independent with ongoing/advanced HEP for self-management at home.  Baseline: no advanced HEP yet 04/28/24:  advancing and updated Goal status: IN PROGRESS  2.  Patient will report 50-75% improvement in low back pain to improve QOL.  Baseline: 10/10 WORST Goal status: IN PROGRESS- 04/28/24 50% improvement   3.  Patient to demonstrate ability to achieve and maintain good spinal alignment/posturing and body mechanics needed for daily activities. Baseline: difficulty with PPT in standing with core contraction--shaky abdominals Goal status: IN PROGRESS- 04/21/24  4.  Patient will demonstrate full pain free lumbar ROM to perform ADLs.   Baseline: Refer to above lumbar ROM table 10/7 updated Goal status: IN PROGRESS  5.  Patient will demonstrate improved BLE strength to >/= 5/5 for improved stability and ease of mobility. Baseline: Refer to above LE MMT table 10/7:  see updated tables Goal status: IN PROGRESS  6. Patient will report </= 40% on Modified Oswestry (MCID = 12%) to demonstrate improved functional ability with decreased pain  interference. Baseline: 58% Goal status: IN PROGRESS- 04/23/24 23 /  50 = 46.0 %   PLAN:  PT FREQUENCY: 1-2x/week  PT DURATION: 8 weeks  PLANNED INTERVENTIONS: 97164- PT Re-evaluation, 97750- Physical Performance Testing, 97110-Therapeutic exercises, 97530- Therapeutic activity, [id number]- Neuromuscular re-education, 97535- Self Care, 02859- Manual therapy, [phone]- Gait training, [phone]- Electrical stimulation (unattended), 97016- Vasopneumatic device, [id number]- Ultrasound, [id number]- Traction (mechanical), [id number]- Ionotophoresis 4mg /ml Dexamethasone , 79439 (1-2 muscles), 20561 (3+ muscles)- Dry Needling, Patient/Family education, Balance training, Stair training, Taping, Joint mobilization, Spinal mobilization, Cryotherapy, and Moist heat  PLAN FOR NEXT SESSION: Continue lumbar stabilization, hip strengthening    Adonijah Baena, PT 04/28/2024, 9:31 PM   Date of referral: see referral Referring provider: Wendling  Referring diagnosis? M54.50 (ICD-10-CM) - Acute bilateral low back pain without sciatica M25.512 (ICD-10-CM) - Acute pain of left shoulder M79.652 (ICD-10-CM) - Pain of left thigh  Treatment diagnosis? (if different than referring diagnosis) R26.89, M54.59, M79.604, M62.81  What was this (referring dx) caused by? Felton Hawks of Condition: Initial Onset (within last 3 months)   Laterality: Lt  Current Functional Measure Score: Back Index modified ODI 23 / 50 = 46.0 %  Objective measurements identify impairments when they are compared to normal values, the uninvolved extremity, and prior level of function.  [x]  Yes  []  No  Objective assessment of functional ability: Moderate functional limitations   Briefly describe symptoms: 73 y/o referred to PT from PCP for a mechanical fall on 02/21/24.   Patient reports she was trying to get away from [place] in her neighbor's yard and turned quickly and fell on L side.   Went to the ED and had negative Xrays, but still having L low  back and leg pain.   States the pain is intermittent and worse with most all activities.  States pain begins in her low back and radiates down the L lateral hip, thigh and calf all the way to her ankle.  States the pain is stabbing and that she has to keep her leg elevated in sitting positions (such as on a footrest) to relieve her pain.  States can't bend over.  Likes to walk for exercise, work in her flowers, and baking.  States bending over to reach into her lower cabinets hurts.    How did symptoms start: fall  Average pain intensity:  Last 24 hours: 4/10  Past week: 4/10  How often does the pt experience symptoms? Intermittently  How much have the symptoms interfered with usual daily activities? Moderately  How has condition changed since care began at this facility? Better  In general, how is the patients overall health? Fair   BACK PAIN (STarT Back Screening Tool) Has pain spread down the leg(s) at some time in the last 2 weeks? Yes Has there been pain in the shoulder or neck at some time in the last 2 weeks? Yes Has the pt only walked short distances because of back pain? no Has patient dressed more slowly because of back pain in the past 2 weeks? no Does patient think it's not safe for a person with this condition to be physically active? no Does patient have worrying thoughts a lot of the time? no Does patient feel back pain is terrible and will never get any better? no Has patient stopped enjoying things they usually enjoy? no

## 2024-05-05 ENCOUNTER — Ambulatory Visit

## 2024-05-05 DIAGNOSIS — R2689 Other abnormalities of gait and mobility: Secondary | ICD-10-CM

## 2024-05-05 DIAGNOSIS — M79604 Pain in right leg: Secondary | ICD-10-CM

## 2024-05-05 DIAGNOSIS — M5459 Other low back pain: Secondary | ICD-10-CM | POA: Diagnosis not present

## 2024-05-05 DIAGNOSIS — M6281 Muscle weakness (generalized): Secondary | ICD-10-CM

## 2024-05-05 DIAGNOSIS — I8310 Varicose veins of unspecified lower extremity with inflammation: Secondary | ICD-10-CM | POA: Diagnosis not present

## 2024-05-05 DIAGNOSIS — M5416 Radiculopathy, lumbar region: Secondary | ICD-10-CM

## 2024-05-05 DIAGNOSIS — R262 Difficulty in walking, not elsewhere classified: Secondary | ICD-10-CM

## 2024-05-05 NOTE — Therapy (Signed)
 OUTPATIENT PHYSICAL THERAPY THORACOLUMBAR TREATMENT   Patient Name: Katelyn Fisher MRN: [id number] DOB:02/04/1951, 73 y.o., female Today's Date: 05/05/2024  END OF SESSION:  PT End of Session - 05/05/24 1713     Visit Number 8    Date for Recertification  06/02/24    PT Start Time 1620    PT Stop Time 1701    PT Time Calculation (min) 41 min    Activity Tolerance Patient tolerated treatment well;No increased pain    Behavior During Therapy [facility] for tasks assessed/performed               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   Anemia 11/07/2011   Anxiety    Arthritis    back   Asymptomatic postmenopausal status 10/01/2008   Qualifier: Diagnosis of   By: Josetta Corrigan      IMO SNOMED Dx Update Oct 2024     Chronic rhinitis 08/21/2021   Current use of beta blocker 06/30/2018   Depression    Diabetes mellitus without complication (HCC)    Endometrial cancer (HCC) 2015   Essential hypertension 08/13/2006   Qualifier: Diagnosis of   By: Nicholaus Channel         Family history of breast cancer    Family history of colon cancer    Family history of ovarian cancer    Family history of prostate cancer    Flank pain 09/14/2019   Gastroesophageal reflux disease without esophagitis 09/14/2010   Qualifier: Diagnosis of   By: Anice CMA, Darlene         GERD (gastroesophageal reflux disease)    H/O total hysterectomy with bilateral salpingo-oophorectomy (BSO) 06/24/2014   Formatting of this note might be different from the original.  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     History of colonic diverticulitis    History of colonic polyps 03/10/2008   Qualifier: Diagnosis of   By: Genie CMA LEODIS), Chick      IMO  SNOMED Dx Update Oct 2024     History of endometrial cancer 02/03/2018   Hyperlipidemia    Hypertension    Hypothyroidism 06/01/2013   hypothyroid     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   Low back pain radiating to right leg 08/18/2012   Mass of left elbow 09/14/2019   Microcytosis 09/28/2016   Mild intermittent asthma without complication 01/26/2016   Moderate persistent asthma without complication 08/07/2018   Morbid obesity (HCC) 11/09/2014   Obstructive sleep apnea 11/15/2010   Osteoarthritis 11/15/2010   B/l knees     Osteopenia 09/2017   T score -1.2 FRAX 3.2% / 0.3%, 2025 -2.0 frax 10.9, 2.2%   Perennial allergic rhinitis 06/30/2018   Personal history of chemotherapy 2015   Port catheter in place 11/15/2015   Screening Mammogram 11/05/2017   Subacromial impingement of left shoulder 12/30/2018   Thyroid  disease 06/01/2013   Type 2 diabetes mellitus with diabetic neuropathy, without long-term current use of insulin (HCC) 10/25/2016   Type 2 diabetes mellitus with obesity 03/12/2011   Past Surgical History:  Procedure Laterality Date   ABDOMINAL HYSTERECTOMY  06/23/2014   [facility], TRH/BSO   CHOLECYSTECTOMY  1991   COLONOSCOPY     DILATION AND CURETTAGE OF  UTERUS     IR REMOVAL TUN ACCESS W/ PORT W/O FL MOD SED  04/30/2017   POLYPECTOMY     TUBAL LIGATION     Patient Active Problem List   Diagnosis Date Noted   Senile osteoporosis 04/20/2024   Syncope and collapse 11/19/2023   Dyspnea on exertion 11/19/2023   Palpitations 11/19/2023   Cardiac murmur 11/19/2023   Allergy    Anxiety    Arthritis    Depression    Diabetes mellitus without complication (HCC)    GERD (gastroesophageal reflux disease)    History of colonic diverticulitis    Hypertension    Acute cough 12/19/2022   Chronic rhinitis 08/21/2021   Mass of left elbow 09/14/2019   Flank pain 09/14/2019   Subacromial impingement of left shoulder 12/30/2018   Moderate persistent asthma without complication  08/07/2018   Current use of beta blocker 06/30/2018   Perennial allergic rhinitis 06/30/2018   Acute bronchitis due to other specified organisms 06/30/2018   History of endometrial cancer 02/03/2018   Screening Mammogram 11/05/2017   Osteopenia 09/2017   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   Type 2 diabetes mellitus with diabetic neuropathy, without long-term current use of insulin (HCC) 10/25/2016   Microcytosis 09/28/2016   Mild intermittent asthma without complication 01/26/2016   Port catheter in place 11/15/2015   Family history of breast cancer    Family history of ovarian cancer    Family history of prostate cancer    Family history of colon cancer    Morbid obesity (HCC) 11/09/2014   H/O total hysterectomy with bilateral salpingo-oophorectomy (BSO) 06/24/2014   Endometrial cancer (HCC) 05/20/2014   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   Personal history of chemotherapy 2015   Hypothyroidism 06/01/2013   Thyroid  disease 06/01/2013   Low back pain radiating to right leg 08/18/2012   Anemia 11/07/2011   Hyperlipidemia 06/15/2011   Type 2 diabetes mellitus with obesity 03/12/2011   Acute pain of right knee 01/27/2011   Obstructive sleep apnea 11/15/2010   Osteoarthritis 11/15/2010   Gastroesophageal reflux disease without esophagitis 09/14/2010   DIVERTICULITIS, HX OF 09/14/2010   Asymptomatic postmenopausal status 10/01/2008   History of colonic polyps 03/10/2008   Essential hypertension 08/13/2006    PCP: Frann Mabel Mt, DO   REFERRING PROVIDER: Frann Mabel Mt*   REFERRING DIAG: M54.50 (ICD-10-CM) - Acute bilateral low back pain without sciatica M25.512 (ICD-10-CM) - Acute pain of left shoulder M79.652 (ICD-10-CM) - Pain of left thigh  THERAPY DIAG:  Other low back pain  Pain in right leg  Other abnormalities of gait and mobility  Muscle weakness (generalized)  Radiculopathy, lumbar region  Difficulty in walking, not elsewhere  classified  RATIONALE FOR EVALUATION AND TREATMENT: Rehabilitation  ONSET DATE: 02/21/24  NEXT MD VISIT:    SUBJECTIVE:  SUBJECTIVE STATEMENT: Patient is very pleased with her progress to date with PT.  Feels she is getting steadily better.    PAIN: Are you having pain? Yes: NPRS scale:  2/10 R low back Pain location: N/A Pain description: N/A Aggravating factors: Bending over Relieving factors: sitting with leg propped up  PERTINENT HISTORY:  Diabetes, HTN, depression, h/o endometrial CA 10 yrs ago, OA, anemia, obesity  PRECAUTIONS: Fall  RED FLAGS: None  WEIGHT BEARING RESTRICTIONS: No  FALLS:  Has patient fallen in last 6 months? Yes. Number of falls 1  LIVING ENVIRONMENT: Lives with: lives with their family and lives with their spouse Lives in: House/apartment Stairs: Yes: External: 2 steps; yes Has following equipment at home: Single point cane  OCCUPATION: retired [employer] operational officer  PLOF: Independent with gait  PATIENT GOALS: get rid of the pain   OBJECTIVE: (objective measures completed at initial evaluation unless otherwise dated)  DIAGNOSTIC FINDINGS:  EXAM: LEFT SHOULDER - 2+ VIEW   COMPARISON:  None Available.   FINDINGS: There is no evidence of fracture or dislocation. There is no evidence of arthropathy. Rounded calcification is seen over greater tuberosity suggesting calcific tendinosis. Soft tissues are unremarkable.   IMPRESSION: Possible calcific tendinosis.  No acute abnormality seen.     Electronically Signed   By: Lynwood Landy Raddle M.D.   On: 02/21/2024 14:27  EXAM: LUMBAR SPINE - COMPLETE 4+ VIEW   COMPARISON:  May 11, 2019.   FINDINGS: No fracture or significant spondylolisthesis. Minimal degenerative disc disease is noted at L2-3.  Moderate degenerative disc disease is noted at L5-S1 hypertrophy of posterior facet joints is seen at L4-5 and L5-S1 secondary to degenerative change.   IMPRESSION: Multilevel degenerative changes as noted above. No acute abnormality seen.     Electronically Signed   By: Lynwood Landy Raddle M.D.   On: 02/21/2024 14:25EXAM: LEFT KNEE - COMPLETE 4 VIEW; LEFT TIBIA AND FIBULA - 2 VIEW; LEFT FEMUR 2 VIEWS   COMPARISON:  None Available.   FINDINGS: There are no findings of fracture or dislocation. No joint effusion. Mild tricompartmental degenerative changes of the knee. Mild subcutaneous soft tissue reticulations of the lower leg.   IMPRESSION: 1. No acute fracture or dislocation. 2. Mild subcutaneous soft tissue reticulations of the lower leg, which may represent edema or contusion.     Electronically Signed   By: Limin  Xu M.D.   On: 02/21/2024 14:48  10/10/19 - Lumbar MRI IMPRESSION: 1. Facet osteoarthritis at L3-4 and below with L4-5 and L5-S1 anterolisthesis. 2. Variable disc degeneration at L2-3 and below, advanced at L5-S1. 3. Spinal stenosis that is high-grade at L4-5 and moderate at L5-S1. 4. Moderate foraminal narrowing on the left at L4-5 and bilaterally at L5-S1.10/10/19 - Lumbar MRI IMPRESSION: 1. Facet osteoarthritis at L3-4 and below with L4-5 and L5-S1 anterolisthesis. 2. Variable disc degeneration at L2-3 and below, advanced at L5-S1. 3. Spinal stenosis that is high-grade at L4-5 and moderate at L5-S1. 4. Moderate foraminal narrowing on the left at L4-5 and bilaterally at L5-S1.  PATIENT SURVEYS:  LEFS = 28/80 ODI = 29 / 50 = 58.0 % QuickDash =  38.6 / 100 = 38.6 %    SCREENING FOR RED FLAGS: Bowel or bladder incontinence: No Spinal tumors: No Cauda equina syndrome: No Compression fracture: No Abdominal aneurysm: No  COGNITION:  Overall cognitive status: Within functional limits for tasks assessed  SENSATION: WFL  POSTURE:  forward head; some R  shift, lower L shoulder  PALPATION:  TTP over the R paraspinals, SI, and piriformis areas  LUMBAR ROM:   Active  Eval 04/28/24  Flexion To knees;p! To mid shins; some p!  Extension 70%; feels better 90%;  better pain  Right lateral flexion To knee; some p! Just below knee  Left lateral flexion To mid thigh; p! To knee  Right rotation 70%; some p! 85%  Left rotation 50%; more p! 85%  (Blank rows = not tested)  MUSCLE LENGTH: Hamstrings: Right SLR 70 deg; Left SLR 40 deg Thomas test: Right NT deg; Left 10 deg Hamstrings: 90/90 is 20 deg on LLE ITB: mild tight L Piriformis: mild tight L Hip flexors: min tightness  LOWER EXTREMITY ROM:     Active  Right eval Left eval  Hip flexion    Hip extension    Hip abduction    Hip adduction    Hip internal rotation    Hip external rotation    Knee flexion 110 97  Knee extension    Ankle dorsiflexion    Ankle plantarflexion    Ankle inversion    Ankle eversion    (Blank rows = not tested)  LOWER EXTREMITY MMT:    MMT Right eval Left eval RLE 04/28/24 LLE 04/28/24  Hip flexion 5  5 5   Hip extension   4- 3  Hip abduction 4  4 4   Hip adduction      Hip internal rotation 5 4- 5 4  Hip external rotation 5 4 5 5   Knee flexion 5 4+ 5 5  Knee extension 5 4 5 5   Ankle dorsiflexion 5 5 5 5   Ankle plantarflexion 4 3    Ankle inversion      Ankle eversion       (Blank rows = not tested)  LUMBAR SPECIAL TESTS:  Straight leg raise test: Positive, Slump test: Positive, SI Compression/distraction test: Negative, and FABER test: Negative  FUNCTIONAL TESTS:  TBD  GAIT: Distance walked: into clinic Assistive device utilized: None Level of assistance: Complete Independence Gait pattern: limps on LLE, decreased L weight shift Comments:    TODAY'S TREATMENT:  05/05/24 THERAPEUTIC EXERCISE: To improve strength and endurance.  Demonstration, verbal and tactile cues throughout for technique. NuStep L5 x 8' SL hip abduction x 2/10  BLE Prone hip ext x 2/10 BLE THERAPEUTIC ACTIVITIES: To improve functional performance.  Demonstration, verbal and tactile cues throughout for technique. Lateral step ups 6' x 10 BLE Step ups 6' x 10 BLE Glute kickback 5lb x 10 BLE Squats x 10 04/28/24 THERAPEUTIC EXERCISE: To improve strength and endurance.  Demonstration, verbal and tactile cues throughout for technique. NuStep L5 x 7'  THERAPEUTIC ACTIVITIES: To improve functional performance.  Demonstration, verbal and tactile cues throughout for technique. SL fire hydrants x 2/10 BLE Prone on elbows x 3' Prone hip ext x 2/10 BLE Rechecked strength and lumbar ROM  NEUROMUSCULAR RE-EDUCATION: To improve balance, kinesthesia, and posture. Supine bridge w/ blue TB hip ER stabilization x 2/10 BLE Prone plank from knees x 10  Seated Large swiss ball:  Bounce x 1'  Alternate arm raise and LAQ x 2/10 BLE  Alternate arm raise and leg march x 2/10 BLE  04/23/24 Nustep L5x50min All exercises seated on green pball:  Bouncing x 1 min Ab sets 15x3 LAQ x 10 BLE March x 10 BLE Lateral weight shifts x 20 Fwd/post weight shift x 20     04/21/24 Nustep L5x61min  Put rubber in L shoe to promote more  neutral hip alignment Standing hip abduction x 15 BLE Standing hip extension x 15 BLE Supine ball squeeze with ab sets 2x10- 3 sec hold Bridge with RTB at knees 2x10 Sidelying clamshell RTB 2x10 R/L Seated on green pball for core stabilization:  Ab sets 10x3  Rows RTB x 10  Shoulder ext RTB x 10    04/17/24 Bike L1x40min Pelvic alignment assessed: L leg slightly shorter than R Put cork in L shoe to promote more neutral pelvic alignment in standing IASTM with foam roll R glutes and R low back Passive R piriformis and figure 4 stretching DKTC on orange pball x 20 Bridge straight leg on orange pball x 10  LTR on orange pball x 10   04/15/24 NEUROMUSCULAR RE-EDUCATION: To improve coordination, kinesthesia, posture, and proprioception.   Quadruped cat/cow x 10 B Quadruped hip extension x 15 B Quadruped x 10 fire hydrants B- more difficult LLE, keeps foot on table Supine ab sets 10x5 Supine march + TrA 10x5 Bridge with TrA x 20  THERAPEUTIC EXERCISE: To improve strength, endurance, ROM, and flexibility.  Nustep L4x18min Seated pball rollout SB each way x 10 Seated R/L QL stretch x 30'  MANUAL THERAPY: To promote normalized muscle tension, improve joint mobility and/or for pain modulation  STM to R lumbar paraspinals, QL closer to origin on iliac crest  04/09/24 THERAPEUTIC EXERCISE: To improve strength, endurance, and ROM.  Demonstration, verbal and tactile cues throughout for technique. NuStep L5 x 6'  THERAPEUTIC ACTIVITIES: To improve functional performance.  Demonstration, verbal and tactile cues throughout for technique. SKTC RLE x 10 Bridge x 20 BLE LTR x 20 BLE Prone lying over 2 pillows with MFR foam roll and manual x 10' Prone over 2 pillow press up x 10 Checked leg length and RLE is slightly longer Gait in clinic demonstrates increased hip ER and toe out on the LLE and fairly straight RLE  NEUROMUSCULAR RE-EDUCATION: To improve balance, posture, and proprioception. Prone over 2 pillows modified plank from knees x 2/10 Quadruped cat/cow x 20 Quadruped knee extension x 10 BLE Quadruped marching with hands x 10 BUE    04/07/24 SELF CARE: Provided education on PT POC progression.initial HEP   PATIENT EDUCATION:  Education details: HEP review and HEP update  Person educated: Patient Education method: Explanation, Demonstration, Verbal cues, Tactile cues, and Handouts Education comprehension: verbalized understanding, verbal cues required, tactile cues required, and needs further education  HOME EXERCISE PROGRAM: Access Code: [email or web address] URL: [email or web address] Date: 04/21/2024 Prepared by: Kincaid Tiger  Exercises - Supine Bridge with Resistance Band  - 1 x daily - 7 x weekly -  2-3 sets - 10 reps - Clamshell with Resistance  - 1 x daily - 7 x weekly - 2-3 sets - 10 reps - Supine Lower Trunk Rotation  - 1 x daily - 7 x weekly - 3 sets - 10 reps - Supine Piriformis Stretch with Leg Straight  - 1 x daily - 7 x weekly - 1 sets - 1 reps - 1 min hold - Modified Thomas Stretch  - 1 x daily - 7 x weekly - 1 sets - 1 reps - 1 min hold - Lying Prone with 2 Pillows  - 1 x daily - 7 x weekly - 1 sets - 1 reps - 2-3 min hold - Prone Press Up  - 1 x daily - 7 x weekly - 3 sets - 10 reps - Plank on Knees  - 1 x daily - 7 x  weekly - 2 sets - 10 reps - Cat Cow  - 1 x daily - 7 x weekly - 3 sets - 10 reps - Quadruped Alternating Leg Extensions  - 1 x daily - 7 x weekly - 3 sets - 10 reps  ASSESSMENT:  CLINICAL IMPRESSION: Pt tolerated treatment well. Advanced LE strengthening exercises based on strength deficits seen last visit. She was able to go on trip last week w/ no limitations d/t pain. PT is required to address strength, pain, functional deficits.   Continue per POC     EVAL: MARTHANN ABSHIER is a 73 y.o. female who was referred to physical therapy for evaluation and treatment for LBP, L thigh pain, and L shoulder pain s/p mechanical fall on 02/21/24   Patient reports onset of low back pain and RLE beginning 02/21/24. Pain is worse with bending, twisting, and lifting.  She has been her for PT in the past for LBP w/ LLE radiculopathy and has an old MRI that shows foraminal stenosis.  Patient has deficits in lumbar ROM, L LE flexibility, LLE strength, abnormal posture, and TTP in the low back and L hip which are interfering with ADLs and are impacting quality of life.  On Modified Oswestry patient scored 29 / 50 = 58.0 %.  [place] will benefit from skilled PT to address above deficits to improve mobility and activity tolerance with decreased pain interference. Patient is advised that we are going to focus on her LBP and LLE pain since this seems to be her bigger problem rather than the  shoulder for now.   If the back pain resolves then we can evaluate her shoulder pain, but we cannot adequately treat these 2 problems at the same time so we will focus on one. Patient is agreeable to this plan.  OBJECTIVE IMPAIRMENTS: Abnormal gait, difficulty walking, decreased ROM, decreased strength, and pain.   ACTIVITY LIMITATIONS: lifting, bending, and locomotion level  PARTICIPATION LIMITATIONS: cleaning and laundry  PERSONAL FACTORS: Age, Fitness, Time since onset of injury/illness/exacerbation, and 3+ comorbidities:   Diabetes, HTN, depression, h/o endometrial CA 10 yrs ago, OA, anemia, obesityare also affecting patient's functional outcome.   REHAB POTENTIAL: Good  CLINICAL DECISION MAKING: Evolving/moderate complexity  EVALUATION COMPLEXITY: Moderate   GOALS: Goals reviewed with patient? Yes  SHORT TERM GOALS: Target date: 05/05/2024     Patient will be independent with initial HEP to improve outcomes and carryover.  Baseline: 100% PT assist required for correct completion Goal status: MET- 04/21/24  2.  Patient will report 25% improvement in low back pain to improve QOL. Baseline: 10/10 worst Goal status: MET- 04/15/24  LONG TERM GOALS: Target date: 06/02/2024  Patient will be independent with ongoing/advanced HEP for self-management at home.  Baseline: no advanced HEP yet 04/28/24:  advancing and updated Goal status: IN PROGRESS- met for current  2.  Patient will report 50-75% improvement in low back pain to improve QOL.  Baseline: 10/10 WORST Goal status: IN PROGRESS- 04/28/24 50% improvement   3.  Patient to demonstrate ability to achieve and maintain good spinal alignment/posturing and body mechanics needed for daily activities. Baseline: difficulty with PPT in standing with core contraction--shaky abdominals Goal status: IN PROGRESS- 05/05/24  4.  Patient will demonstrate full pain free lumbar ROM to perform ADLs.   Baseline: Refer to above lumbar ROM  table 10/7 updated Goal status: IN PROGRESS  5.  Patient will demonstrate improved BLE strength to >/= 5/5 for improved stability and ease of mobility.  Baseline: Refer to above LE MMT table 10/7:  see updated tables Goal status: IN PROGRESS  6. Patient will report </= 40% on Modified Oswestry (MCID = 12%) to demonstrate improved functional ability with decreased pain interference. Baseline: 58% Goal status: IN PROGRESS- 04/23/24 23 / 50 = 46.0 %   PLAN:  PT FREQUENCY: 1-2x/week  PT DURATION: 8 weeks  PLANNED INTERVENTIONS: 97164- PT Re-evaluation, 97750- Physical Performance Testing, 97110-Therapeutic exercises, 97530- Therapeutic activity, [id number]- Neuromuscular re-education, 97535- Self Care, 02859- Manual therapy, [phone]- Gait training, [phone]- Electrical stimulation (unattended), 97016- Vasopneumatic device, [id number]- Ultrasound, [id number]- Traction (mechanical), [id number]- Ionotophoresis 4mg /ml Dexamethasone , 79439 (1-2 muscles), 20561 (3+ muscles)- Dry Needling, Patient/Family education, Balance training, Stair training, Taping, Joint mobilization, Spinal mobilization, Cryotherapy, and Moist heat  PLAN FOR NEXT SESSION: Continue lumbar stabilization, hip strengthening    Sol LITTIE Gaskins, PTA 05/05/2024, 5:17 PM   Date of referral: see referral Referring provider: [place]  Referring diagnosis? M54.50 (ICD-10-CM) - Acute bilateral low back pain without sciatica M25.512 (ICD-10-CM) - Acute pain of left shoulder M79.652 (ICD-10-CM) - Pain of left thigh  Treatment diagnosis? (if different than referring diagnosis) R26.89, M54.59, M79.604, M62.81  What was this (referring dx) caused by? Felton Hawks of Condition: Initial Onset (within last 3 months)   Laterality: Lt  Current Functional Measure Score: Back Index modified ODI 23 / 50 = 46.0 %  Objective measurements identify impairments when they are compared to normal values, the uninvolved extremity, and prior level of function.  [x]   Yes  []  No  Objective assessment of functional ability: Moderate functional limitations   Briefly describe symptoms: 73 y/o referred to PT from PCP for a mechanical fall on 02/21/24.   Patient reports she was trying to get away from [place] in her neighbor's yard and turned quickly and fell on L side.   Went to the ED and had negative Xrays, but still having L low back and leg pain.   States the pain is intermittent and worse with most all activities.  States pain begins in her low back and radiates down the L lateral hip, thigh and calf all the way to her ankle.  States the pain is stabbing and that she has to keep her leg elevated in sitting positions (such as on a footrest) to relieve her pain.  States can't bend over.  Likes to walk for exercise, work in her flowers, and baking.  States bending over to reach into her lower cabinets hurts.    How did symptoms start: fall  Average pain intensity:  Last 24 hours: 4/10  Past week: 4/10  How often does the pt experience symptoms? Intermittently  How much have the symptoms interfered with usual daily activities? Moderately  How has condition changed since care began at this facility? Better  In general, how is the patients overall health? Fair   BACK PAIN (STarT Back Screening Tool) Has pain spread down the leg(s) at some time in the last 2 weeks? Yes Has there been pain in the shoulder or neck at some time in the last 2 weeks? Yes Has the pt only walked short distances because of back pain? no Has patient dressed more slowly because of back pain in the past 2 weeks? no Does patient think it's not safe for a person with this condition to be physically active? no Does patient have worrying thoughts a lot of the time? no Does patient feel back pain is terrible and will never get any better?  no Has patient stopped enjoying things they usually enjoy? no

## 2024-05-12 ENCOUNTER — Ambulatory Visit

## 2024-05-13 ENCOUNTER — Ambulatory Visit

## 2024-05-18 ENCOUNTER — Other Ambulatory Visit: Payer: Self-pay

## 2024-05-18 ENCOUNTER — Ambulatory Visit (INDEPENDENT_AMBULATORY_CARE_PROVIDER_SITE_OTHER): Admitting: Obstetrics and Gynecology

## 2024-05-18 ENCOUNTER — Encounter: Payer: Self-pay | Admitting: Obstetrics and Gynecology

## 2024-05-18 ENCOUNTER — Other Ambulatory Visit (HOSPITAL_BASED_OUTPATIENT_CLINIC_OR_DEPARTMENT_OTHER): Payer: Self-pay

## 2024-05-18 ENCOUNTER — Other Ambulatory Visit (HOSPITAL_COMMUNITY): Payer: Self-pay

## 2024-05-18 ENCOUNTER — Ambulatory Visit: Admitting: Rehabilitation

## 2024-05-18 VITALS — BP 122/80 | HR 91 | Wt 219.6 lb

## 2024-05-18 DIAGNOSIS — M810 Age-related osteoporosis without current pathological fracture: Secondary | ICD-10-CM

## 2024-05-18 DIAGNOSIS — R2689 Other abnormalities of gait and mobility: Secondary | ICD-10-CM

## 2024-05-18 DIAGNOSIS — M5459 Other low back pain: Secondary | ICD-10-CM

## 2024-05-18 DIAGNOSIS — M6281 Muscle weakness (generalized): Secondary | ICD-10-CM

## 2024-05-18 DIAGNOSIS — M5416 Radiculopathy, lumbar region: Secondary | ICD-10-CM

## 2024-05-18 DIAGNOSIS — M79604 Pain in right leg: Secondary | ICD-10-CM

## 2024-05-18 DIAGNOSIS — R262 Difficulty in walking, not elsewhere classified: Secondary | ICD-10-CM

## 2024-05-18 MED ORDER — JUBBONTI 60 MG/ML ~~LOC~~ SOSY
60.0000 mg | PREFILLED_SYRINGE | SUBCUTANEOUS | 6 refills | Status: AC
  Filled 2024-05-18: qty 1, 180d supply, fill #0
  Filled 2024-05-18: qty 1, 90d supply, fill #0

## 2024-05-18 NOTE — Progress Notes (Addendum)
 Pharmacy Patient Advocate Encounter  Insurance verification completed.   The patient is insured through [employer]  Ran test claim for [employer]. Co-pay is $0.  This test claim was processed through [facility]- copay amounts may vary at other pharmacies due to pharmacy/plan contracts, or as the patient moves through the different stages of their insurance plan.

## 2024-05-18 NOTE — Therapy (Signed)
 OUTPATIENT PHYSICAL THERAPY THORACOLUMBAR TREATMENT   Patient Name: Katelyn Fisher MRN: [id number] DOB:[birth year], 73 y.o., female Today's Date: 05/18/2024  END OF SESSION:  PT End of Session - 05/18/24 1113     Visit Number 9    Date for Recertification  06/02/24    PT Start Time 1021    PT Stop Time 1115    PT Time Calculation (min) 54 min    Activity Tolerance Patient tolerated treatment well;No increased pain    Behavior During Therapy [facility] for tasks assessed/performed               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   Anemia 11/07/2011   Anxiety    Arthritis    back   Asymptomatic postmenopausal status 10/01/2008   Qualifier: Diagnosis of   By: Josetta Corrigan      IMO SNOMED Dx Update Oct 2024     Chronic rhinitis 08/21/2021   Current use of beta blocker 06/30/2018   Depression    Diabetes mellitus without complication (HCC)    Endometrial cancer (HCC) 2015   Essential hypertension 08/13/2006   Qualifier: Diagnosis of   By: Nicholaus Channel         Family history of breast cancer    Family history of colon cancer    Family history of ovarian cancer    Family history of prostate cancer    Flank pain 09/14/2019   Gastroesophageal reflux disease without esophagitis 09/14/2010   Qualifier: Diagnosis of   By: Anice CMA, Darlene         GERD (gastroesophageal reflux disease)    H/O total hysterectomy with bilateral salpingo-oophorectomy (BSO) 06/24/2014   Formatting of this note might be different from the original.  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     History of colonic diverticulitis    History of colonic polyps 03/10/2008   Qualifier: Diagnosis of   By: Genie CMA LEODIS), Chick      IMO  SNOMED Dx Update Oct 2024     History of endometrial cancer 02/03/2018   Hyperlipidemia    Hypertension    Hypothyroidism 06/01/2013   hypothyroid     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   Low back pain radiating to right leg 08/18/2012   Mass of left elbow 09/14/2019   Microcytosis 09/28/2016   Mild intermittent asthma without complication 01/26/2016   Moderate persistent asthma without complication 08/07/2018   Morbid obesity (HCC) 11/09/2014   Obstructive sleep apnea 11/15/2010   Osteoarthritis 11/15/2010   B/l knees     Osteopenia 09/2017   T score -1.2 FRAX 3.2% / 0.3%, 2025 -2.0 frax 10.9, 2.2%   Perennial allergic rhinitis 06/30/2018   Personal history of chemotherapy 2015   Port catheter in place 11/15/2015   Screening Mammogram 11/05/2017   Subacromial impingement of left shoulder 12/30/2018   Thyroid  disease 06/01/2013   Type 2 diabetes mellitus with diabetic neuropathy, without long-term current use of insulin (HCC) 10/25/2016   Type 2 diabetes mellitus with obesity 03/12/2011   Past Surgical History:  Procedure Laterality Date   ABDOMINAL HYSTERECTOMY  06/23/2014   [facility], TRH/BSO   CHOLECYSTECTOMY  1991   COLONOSCOPY     DILATION AND CURETTAGE OF  UTERUS     IR REMOVAL TUN ACCESS W/ PORT W/O FL MOD SED  04/30/2017   POLYPECTOMY     TUBAL LIGATION     Patient Active Problem List   Diagnosis Date Noted   Senile osteoporosis 04/20/2024   Syncope and collapse 11/19/2023   Dyspnea on exertion 11/19/2023   Palpitations 11/19/2023   Cardiac murmur 11/19/2023   Allergy    Anxiety    Arthritis    Depression    Diabetes mellitus without complication (HCC)    GERD (gastroesophageal reflux disease)    History of colonic diverticulitis    Hypertension    Acute cough 12/19/2022   Chronic rhinitis 08/21/2021   Mass of left elbow 09/14/2019   Flank pain 09/14/2019   Subacromial impingement of left shoulder 12/30/2018   Moderate persistent asthma without complication  08/07/2018   Current use of beta blocker 06/30/2018   Perennial allergic rhinitis 06/30/2018   Acute bronchitis due to other specified organisms 06/30/2018   History of endometrial cancer 02/03/2018   Screening Mammogram 11/05/2017   Osteopenia 09/2017   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   Type 2 diabetes mellitus with diabetic neuropathy, without long-term current use of insulin (HCC) 10/25/2016   Microcytosis 09/28/2016   Mild intermittent asthma without complication 01/26/2016   Port catheter in place 11/15/2015   Family history of breast cancer    Family history of ovarian cancer    Family history of prostate cancer    Family history of colon cancer    Morbid obesity (HCC) 11/09/2014   H/O total hysterectomy with bilateral salpingo-oophorectomy (BSO) 06/24/2014   Endometrial cancer (HCC) 05/20/2014   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   Personal history of chemotherapy 2015   Hypothyroidism 06/01/2013   Thyroid  disease 06/01/2013   Low back pain radiating to right leg 08/18/2012   Anemia 11/07/2011   Hyperlipidemia 06/15/2011   Type 2 diabetes mellitus with obesity 03/12/2011   Acute pain of right knee 01/27/2011   Obstructive sleep apnea 11/15/2010   Osteoarthritis 11/15/2010   Gastroesophageal reflux disease without esophagitis 09/14/2010   DIVERTICULITIS, HX OF 09/14/2010   Asymptomatic postmenopausal status 10/01/2008   History of colonic polyps 03/10/2008   Essential hypertension 08/13/2006    PCP: Frann Mabel Mt, DO   REFERRING PROVIDER: Frann Mabel Mt*   REFERRING DIAG: M54.50 (ICD-10-CM) - Acute bilateral low back pain without sciatica M25.512 (ICD-10-CM) - Acute pain of left shoulder M79.652 (ICD-10-CM) - Pain of left thigh  THERAPY DIAG:  Other low back pain  Pain in right leg  Other abnormalities of gait and mobility  Muscle weakness (generalized)  Radiculopathy, lumbar region  Difficulty in walking, not elsewhere  classified  RATIONALE FOR EVALUATION AND TREATMENT: Rehabilitation  ONSET DATE: 02/21/24  NEXT MD VISIT:    SUBJECTIVE:  SUBJECTIVE STATEMENT: Patient reports was on vacation at the [employer] last week and did a lot riding in the car.  Not a lot of time for doing her HEP.   States feels about 80-85% improved over initial visit. Rates pain about 2/10 on the R low back and hip  PAIN: Are you having pain? Yes: NPRS scale:  2/10 R low back Pain location: N/A Pain description: N/A Aggravating factors: Bending over Relieving factors: sitting with leg propped up  PERTINENT HISTORY:  Diabetes, HTN, depression, h/o endometrial CA 10 yrs ago, OA, anemia, obesity  PRECAUTIONS: Fall  RED FLAGS: None  WEIGHT BEARING RESTRICTIONS: No  FALLS:  Has patient fallen in last 6 months? Yes. Number of falls 1  LIVING ENVIRONMENT: Lives with: lives with their family and lives with their spouse Lives in: House/apartment Stairs: Yes: External: 2 steps; yes Has following equipment at home: Single point cane  OCCUPATION: retired [employer] services operational officer  PLOF: Independent with gait  PATIENT GOALS: get rid of the pain   OBJECTIVE: (objective measures completed at initial evaluation unless otherwise dated)  DIAGNOSTIC FINDINGS:  EXAM: LEFT SHOULDER - 2+ VIEW   COMPARISON:  None Available.   FINDINGS: There is no evidence of fracture or dislocation. There is no evidence of arthropathy. Rounded calcification is seen over greater tuberosity suggesting calcific tendinosis. Soft tissues are unremarkable.   IMPRESSION: Possible calcific tendinosis.  No acute abnormality seen.     Electronically Signed   By: Lynwood Landy Raddle M.D.   On: 02/21/2024 14:27  EXAM: LUMBAR SPINE - COMPLETE 4+ VIEW    COMPARISON:  May 11, 2019.   FINDINGS: No fracture or significant spondylolisthesis. Minimal degenerative disc disease is noted at L2-3. Moderate degenerative disc disease is noted at L5-S1 hypertrophy of posterior facet joints is seen at L4-5 and L5-S1 secondary to degenerative change.   IMPRESSION: Multilevel degenerative changes as noted above. No acute abnormality seen.     Electronically Signed   By: Lynwood Landy Raddle M.D.   On: 02/21/2024 14:25EXAM: LEFT KNEE - COMPLETE 4 VIEW; LEFT TIBIA AND FIBULA - 2 VIEW; LEFT FEMUR 2 VIEWS   COMPARISON:  None Available.   FINDINGS: There are no findings of fracture or dislocation. No joint effusion. Mild tricompartmental degenerative changes of the knee. Mild subcutaneous soft tissue reticulations of the lower leg.   IMPRESSION: 1. No acute fracture or dislocation. 2. Mild subcutaneous soft tissue reticulations of the lower leg, which may represent edema or contusion.     Electronically Signed   By: Limin  Xu M.D.   On: 02/21/2024 14:48  10/10/19 - Lumbar MRI IMPRESSION: 1. Facet osteoarthritis at L3-4 and below with L4-5 and L5-S1 anterolisthesis. 2. Variable disc degeneration at L2-3 and below, advanced at L5-S1. 3. Spinal stenosis that is high-grade at L4-5 and moderate at L5-S1. 4. Moderate foraminal narrowing on the left at L4-5 and bilaterally at L5-S1.10/10/19 - Lumbar MRI IMPRESSION: 1. Facet osteoarthritis at L3-4 and below with L4-5 and L5-S1 anterolisthesis. 2. Variable disc degeneration at L2-3 and below, advanced at L5-S1. 3. Spinal stenosis that is high-grade at L4-5 and moderate at L5-S1. 4. Moderate foraminal narrowing on the left at L4-5 and bilaterally at L5-S1.  PATIENT SURVEYS:  LEFS = 28/80 ODI = 29 / 50 = 58.0 % QuickDash =  38.6 / 100 = 38.6 %    SCREENING FOR RED FLAGS: Bowel or bladder incontinence: No Spinal tumors: No Cauda equina syndrome: No Compression fracture: No Abdominal  aneurysm: No  COGNITION:  Overall cognitive status: Within functional limits for tasks assessed  SENSATION: WFL  POSTURE:  forward head; some R shift, lower L shoulder  PALPATION: TTP over the R paraspinals, SI, and piriformis areas  LUMBAR ROM:   Active  Eval 04/28/24 05/18/24  Flexion To knees;p! To mid shins; some p! To ankles; no pain increase  Extension 70%; feels better 90%;  better pain 100%;  no pain increase  Right lateral flexion To knee; some p! Just below knee To knee  Left lateral flexion To mid thigh; p! To knee To knee  Right rotation 70%; some p! 85% 100%  Left rotation 50%; more p! 85% 100%  (Blank rows = not tested)  MUSCLE LENGTH: Hamstrings: Right SLR 70 deg; Left SLR 40 deg Thomas test: Right NT deg; Left 10 deg Hamstrings: 90/90 is 20 deg on LLE ITB: mild tight L Piriformis: mild tight L Hip flexors: min tightness  LOWER EXTREMITY ROM:     Active  Right eval Left eval  Hip flexion    Hip extension    Hip abduction    Hip adduction    Hip internal rotation    Hip external rotation    Knee flexion 110 97  Knee extension    Ankle dorsiflexion    Ankle plantarflexion    Ankle inversion    Ankle eversion    (Blank rows = not tested)  LOWER EXTREMITY MMT:    MMT Right eval Left eval RLE 04/28/24 LLE 04/28/24 R/L 05/18/24  Hip flexion 5  5 5  5/5  Hip extension   4- 3 3/4  Hip abduction 4  4 4  4/4+  Hip adduction       Hip internal rotation 5 4- 5 4 5/5  Hip external rotation 5 4 5 5  5/5  Knee flexion 5 4+ 5 5 5/5  Knee extension 5 4 5 5  5/5  Ankle dorsiflexion 5 5 5 5  5/5  Ankle plantarflexion 4 3     Ankle inversion       Ankle eversion        (Blank rows = not tested)  LUMBAR SPECIAL TESTS:  Straight leg raise test: Positive, Slump test: Positive, SI Compression/distraction test: Negative, and FABER test: Negative  FUNCTIONAL TESTS:  TBD  GAIT: Distance walked: into clinic Assistive device utilized: None Level of assistance:  Complete Independence Gait pattern: limps on LLE, decreased L weight shift Comments:    TODAY'S TREATMENT:  05/18/24 THERAPEUTIC EXERCISE: To improve strength and endurance.  Demonstration, verbal and tactile cues throughout for technique. Bike L0 x 7'  NEUROMUSCULAR RE-EDUCATION: To improve coordination, kinesthesia, and posture. Seated on dynadisc palloff press RTB x 10 each way Standing palloff press RTB x 10 each way MFI walkouts RTB x 4 laps at mat table RDL 5# x 2/10  (painful;  difficulty maintaining PPT) Supine PPT w/ marching x 20 BLE Supine PPT w/ LAQ x 20 BLE Supine peanut ball rollouts w PPT x 20 BLE  THERAPEUTIC ACTIVITIES: To improve functional performance.  Demonstration, verbal and tactile cues throughout for technique. Curl ups x 10 straight; x 10 L; x10 R Supine SLR ham stretch x 1' x 2 BLE Rechecked strength and lumbar ROM  MODALITIES: HP/IFC to low back in prone at machine default settings x 17-20 mA intensity x 15' for pain relief  05/05/24 THERAPEUTIC EXERCISE: To improve strength and endurance.  Demonstration, verbal and tactile cues throughout for technique. NuStep L5 x 8' SL  hip abduction x 2/10 BLE Prone hip ext x 2/10 BLE THERAPEUTIC ACTIVITIES: To improve functional performance.  Demonstration, verbal and tactile cues throughout for technique. Lateral step ups 6' x 10 BLE Step ups 6' x 10 BLE Glute kickback 5lb x 10 BLE Squats x 10  PATIENT EDUCATION:  Education details: HEP review and HEP update  Person educated: Patient Education method: Explanation, Demonstration, Verbal cues, Tactile cues, and Handouts Education comprehension: verbalized understanding, verbal cues required, tactile cues required, and needs further education  HOME EXERCISE PROGRAM: Access Code: [email or web address] URL: [email or web address] Date: 04/21/2024 Prepared by: Braylin Clark  Exercises - Supine Bridge with Resistance Band  - 1 x daily - 7 x weekly - 2-3  sets - 10 reps - Clamshell with Resistance  - 1 x daily - 7 x weekly - 2-3 sets - 10 reps - Supine Lower Trunk Rotation  - 1 x daily - 7 x weekly - 3 sets - 10 reps - Supine Piriformis Stretch with Leg Straight  - 1 x daily - 7 x weekly - 1 sets - 1 reps - 1 min hold - Modified Thomas Stretch  - 1 x daily - 7 x weekly - 1 sets - 1 reps - 1 min hold - Lying Prone with 2 Pillows  - 1 x daily - 7 x weekly - 1 sets - 1 reps - 2-3 min hold - Prone Press Up  - 1 x daily - 7 x weekly - 3 sets - 10 reps - Plank on Knees  - 1 x daily - 7 x weekly - 2 sets - 10 reps - Cat Cow  - 1 x daily - 7 x weekly - 3 sets - 10 reps - Quadruped Alternating Leg Extensions  - 1 x daily - 7 x weekly - 3 sets - 10 reps  ASSESSMENT:  CLINICAL IMPRESSION:   Patient is doing well overall and feels she has made good improvement.   She has much less pain now.  However, she does have residual weakness in the hip and back musculature with strength testing today.   She is doing better with posterior pelvic tilt postural corrections, but can still lose the position with challenging activities.   She could make more improvements with PT, but she is going to decide and let us  know if she wants to continue or DC on next visit.      EVAL: Katelyn Fisher is a 73 y.o. female who was referred to physical therapy for evaluation and treatment for LBP, L thigh pain, and L shoulder pain s/p mechanical fall on 02/21/24   Patient reports onset of low back pain and RLE beginning 02/21/24. Pain is worse with bending, twisting, and lifting.  She has been her for PT in the past for LBP w/ LLE radiculopathy and has an old MRI that shows foraminal stenosis.  Patient has deficits in lumbar ROM, L LE flexibility, LLE strength, abnormal posture, and TTP in the low back and L hip which are interfering with ADLs and are impacting quality of life.  On Modified Oswestry patient scored 29 / 50 = 58.0 %.  Chennel will benefit from skilled PT to address above  deficits to improve mobility and activity tolerance with decreased pain interference. Patient is advised that we are going to focus on her LBP and LLE pain since this seems to be her bigger problem rather than the shoulder for now.   If the back pain resolves then we can evaluate  her shoulder pain, but we cannot adequately treat these 2 problems at the same time so we will focus on one. Patient is agreeable to this plan.  OBJECTIVE IMPAIRMENTS: Abnormal gait, difficulty walking, decreased ROM, decreased strength, and pain.   ACTIVITY LIMITATIONS: lifting, bending, and locomotion level  PARTICIPATION LIMITATIONS: cleaning and laundry  PERSONAL FACTORS: Age, Fitness, Time since onset of injury/illness/exacerbation, and 3+ comorbidities:   Diabetes, HTN, depression, h/o endometrial CA 10 yrs ago, OA, anemia, obesityare also affecting patient's functional outcome.   REHAB POTENTIAL: Good  CLINICAL DECISION MAKING: Evolving/moderate complexity  EVALUATION COMPLEXITY: Moderate   GOALS: Goals reviewed with patient? Yes  SHORT TERM GOALS: Target date: 05/05/2024     Patient will be independent with initial HEP to improve outcomes and carryover.  Baseline: 100% PT assist required for correct completion Goal status: MET- 04/21/24  2.  Patient will report 25% improvement in low back pain to improve QOL. Baseline: 10/10 worst Goal status: MET- 04/15/24  LONG TERM GOALS: Target date: 06/02/2024  Patient will be independent with ongoing/advanced HEP for self-management at home.  Baseline: no advanced HEP yet 04/28/24:  advancing and updated Goal status: IN PROGRESS- met for current  2.  Patient will report 50-75% improvement in low back pain to improve QOL.  Baseline: 10/10 WORST 05/18/24:  3/10 worst over last week Goal status:  MET   3.  Patient to demonstrate ability to achieve and maintain good spinal alignment/posturing and body mechanics needed for daily activities. Baseline:  difficulty with PPT in standing with core contraction--shaky abdominals Goal status: IN PROGRESS- 05/05/24  4.  Patient will demonstrate full pain free lumbar ROM to perform ADLs.   Baseline: Refer to above lumbar ROM table 10/7 updated Goal status: MET--05/18/24  5.  Patient will demonstrate improved BLE strength to >/= 5/5 for improved stability and ease of mobility. Baseline: Refer to above LE MMT table 10/7:  see updated tables 05/18/24:   see updated tables above Goal status: IN PROGRESS  6. Patient will report </= 40% on Modified Oswestry (MCID = 12%) to demonstrate improved functional ability with decreased pain interference. Baseline: 58% 05/18/24:   Goal status: IN PROGRESS- 04/23/24 23 / 50 = 46.0 %   PLAN:  PT FREQUENCY: 1-2x/week  PT DURATION: 8 weeks  PLANNED INTERVENTIONS: 97164- PT Re-evaluation, 97750- Physical Performance Testing, 97110-Therapeutic exercises, 97530- Therapeutic activity, [id number]- Neuromuscular re-education, 97535- Self Care, 02859- Manual therapy, [id number]- Gait training, [phone]- Electrical stimulation (unattended), 97016- Vasopneumatic device, [id number]- Ultrasound, [id number]- Traction (mechanical), [id number]- Ionotophoresis 4mg /ml Dexamethasone , 79439 (1-2 muscles), 20561 (3+ muscles)- Dry Needling, Patient/Family education, Balance training, Stair training, Taping, Joint mobilization, Spinal mobilization, Cryotherapy, and Moist heat  PLAN FOR NEXT SESSION: Recheck ODI outcome, if she would like to continue will need new goals.   She may want D/C next visit.   Next visit is last scheduled visit   Shaelyn Decarli, PT 05/18/2024, 2:41 PM   Date of referral: see referral Referring provider: [place]  Referring diagnosis? M54.50 (ICD-10-CM) - Acute bilateral low back pain without sciatica M25.512 (ICD-10-CM) - Acute pain of left shoulder M79.652 (ICD-10-CM) - Pain of left thigh  Treatment diagnosis? (if different than referring diagnosis) R26.89, M54.59, M79.604,  M62.81  What was this (referring dx) caused by? Felton Hawks of Condition: Initial Onset (within last 3 months)   Laterality: Lt  Current Functional Measure Score: Back Index modified ODI 23 / 50 = 46.0 %  Objective measurements identify impairments when they are compared to normal  values, the uninvolved extremity, and prior level of function.  [x]  Yes  []  No  Objective assessment of functional ability: Moderate functional limitations   Briefly describe symptoms: 73 y/o referred to PT from PCP for a mechanical fall on 02/21/24.   Patient reports she was trying to get away from [employer] in her neighbor's yard and turned quickly and fell on L side.   Went to the ED and had negative Xrays, but still having L low back and leg pain.   States the pain is intermittent and worse with most all activities.  States pain begins in her low back and radiates down the L lateral hip, thigh and calf all the way to her ankle.  States the pain is stabbing and that she has to keep her leg elevated in sitting positions (such as on a footrest) to relieve her pain.  States can't bend over.  Likes to walk for exercise, work in her flowers, and baking.  States bending over to reach into her lower cabinets hurts.    How did symptoms start: fall  Average pain intensity:  Last 24 hours: 4/10  Past week: 4/10  How often does the pt experience symptoms? Intermittently  How much have the symptoms interfered with usual daily activities? Moderately  How has condition changed since care began at this facility? Better  In general, how is the patients overall health? Fair   BACK PAIN (STarT Back Screening Tool) Has pain spread down the leg(s) at some time in the last 2 weeks? Yes Has there been pain in the shoulder or neck at some time in the last 2 weeks? Yes Has the pt only walked short distances because of back pain? no Has patient dressed more slowly because of back pain in the past 2 weeks? no Does  patient think it's not safe for a person with this condition to be physically active? no Does patient have worrying thoughts a lot of the time? no Does patient feel back pain is terrible and will never get any better? no Has patient stopped enjoying things they usually enjoy? no

## 2024-05-18 NOTE — Progress Notes (Signed)
   Acute Office Visit  Subjective:    Patient ID: Katelyn Fisher, female    DOB: 01-23-1951, 73 y.o.   MRN: [id number]   HPI 73 y.o. presents today for Consult (Wants to discuss infusions for bones. Has questions about it ) . Patient with worsening bone density with most recent at [facility] -2.2 frax 10.9, 2.2% was at -1.2 With recent fall on left hip while trying to get away from a dog.  Multiple degenerative changes seen on xrays. No fractures. Patient is on inhaled steroid Patient approved for reclast but nervous about side effects and going to the hospital for an infusion. She does not want to take a pill either. She would feel more comfortable with prolia /jubonnti. Medical side denied prolia . To try pharmacy side through [facility] pharmacy  No LMP recorded. Patient has had a hysterectomy.    Review of Systems     Objective:    OBGyn Exam  BP 122/80   Pulse 91   Wt 219 lb 9.6 oz (99.6 kg)   SpO2 97%   BMI 36.54 kg/m  Wt Readings from Last 3 Encounters:  05/18/24 219 lb 9.6 oz (99.6 kg)  04/20/24 224 lb (101.6 kg)  04/16/24 218 lb (98.9 kg)      Assessment & Plan:  Borderline osteoporosis Inhaled steroid use Fall risk Counseled on options with prolia /jubbonti, bisphosponates oral or iv. She would like to try [facility] to see if coverage for prolia /jubbonti or just wait until her next bone density. She was encouraged to do weight bearing exercises, calcium , vit D. 30 minutes spent on reviewing records, imaging,  and one on one patient time and counseling patient and documentation Dr. Glennon Almarie Fisher Katelyn

## 2024-05-19 ENCOUNTER — Other Ambulatory Visit (HOSPITAL_BASED_OUTPATIENT_CLINIC_OR_DEPARTMENT_OTHER): Payer: Self-pay

## 2024-05-19 ENCOUNTER — Encounter (HOSPITAL_COMMUNITY)

## 2024-05-19 ENCOUNTER — Ambulatory Visit (INDEPENDENT_AMBULATORY_CARE_PROVIDER_SITE_OTHER)

## 2024-05-19 DIAGNOSIS — Z23 Encounter for immunization: Secondary | ICD-10-CM | POA: Diagnosis not present

## 2024-05-19 NOTE — Progress Notes (Signed)
 Pt was in office today for a Flu vaccine, vaccine was given in L deltoid and pt tolerated well.

## 2024-05-20 ENCOUNTER — Encounter (HOSPITAL_COMMUNITY)

## 2024-05-21 ENCOUNTER — Other Ambulatory Visit (HOSPITAL_COMMUNITY): Payer: Self-pay

## 2024-05-22 ENCOUNTER — Ambulatory Visit: Payer: Self-pay

## 2024-05-22 ENCOUNTER — Other Ambulatory Visit (HOSPITAL_COMMUNITY): Payer: Self-pay

## 2024-05-22 NOTE — Telephone Encounter (Signed)
 She can probably just use some Tylenol , ice and an oral antihistamine like Zyrtec or Xyzal .

## 2024-05-22 NOTE — Telephone Encounter (Signed)
 FYI Only or Action Required?: FYI only for provider: patient going to Urgent Care due to no availability at PCP office or surrounding areas & patient concerned about having a reaction.  Patient was last seen in primary care on 04/13/2024 by Frann Mabel Mt, DO.  Called Nurse Triage reporting Rash.  Symptoms began afterwards and has gotten worse each day.  Interventions attempted: OTC medications: tylenol .  Symptoms are: gradually worsening.  Triage Disposition: Call PCP Within 24 Hours  Patient/caregiver understands and will follow disposition?: Yes           Copied from CRM #[id number]. Topic: Clinical - Red Word Triage >> May 22, 2024  3:33 PM Taleah C wrote: Red Word that prompted transfer to Nurse Triage: allergic reaction to the flu shot, has a big pink rash on arm. Arm sore, can't move arm. Reason for Disposition  [1] Over 3 days (72 hours) since shot AND [2] redness, swelling or pain getting worse  Answer Assessment - Initial Assessment Questions Patient received the flu shot 3 days ago and states that now the area is sore, red, swollen, and itches She denies any difficulty breathing, chest pain, nausea/vomiting but states the day after her flu shot she did feel nauseous but that subsided Patient was concerned because she states she has a lot of allergies and she has gotten the flu shot every year and never had this happen before She states that she took Tylenol  and also has been putting alcohol on it Patient worried about this possible reaction and is advised that with no openings in her PCP office or any surrounding offices within the region today, it's recommended that she goes to Urgent Care for further evaluation Patient agrees with this and states that she will go to Urgent Care today Patient is advised to call us  back if anything changes or with any further questions/concerns. Patient is advised that if anything worsens to go to the Emergency Room. Patient  verbalized understanding.    1. SYMPTOMS: What is the main symptom? (e.g., pain, redness, or swelling at injection site; feeling tired, fever, muscle aches)      Swelling, redness 2. ONSET: When was the vaccine (shot) given? How much later did the redness/swelling/itching begin? (e.g., hours, days ago)      Started afterwards and has gotten worse each day 3. SEVERITY: How bad is it?      Patient states the area is red and itching and she has never had this happen before  Protocols used: Immunization Reactions-A-AH

## 2024-05-22 NOTE — Telephone Encounter (Signed)
 FYI

## 2024-05-25 NOTE — Telephone Encounter (Signed)
 Called pt and sent her message per Dr.Wendling recommendation.

## 2024-05-26 ENCOUNTER — Encounter: Admitting: Rehabilitation

## 2024-05-31 ENCOUNTER — Other Ambulatory Visit: Payer: Self-pay | Admitting: Family Medicine

## 2024-05-31 DIAGNOSIS — E669 Obesity, unspecified: Secondary | ICD-10-CM

## 2024-06-01 ENCOUNTER — Ambulatory Visit: Attending: Family Medicine | Admitting: Rehabilitation

## 2024-06-01 ENCOUNTER — Encounter: Payer: Self-pay | Admitting: Rehabilitation

## 2024-06-01 DIAGNOSIS — R2689 Other abnormalities of gait and mobility: Secondary | ICD-10-CM | POA: Diagnosis present

## 2024-06-01 DIAGNOSIS — M5459 Other low back pain: Secondary | ICD-10-CM | POA: Insufficient documentation

## 2024-06-01 DIAGNOSIS — M5416 Radiculopathy, lumbar region: Secondary | ICD-10-CM | POA: Insufficient documentation

## 2024-06-01 DIAGNOSIS — M79604 Pain in right leg: Secondary | ICD-10-CM | POA: Insufficient documentation

## 2024-06-01 DIAGNOSIS — R262 Difficulty in walking, not elsewhere classified: Secondary | ICD-10-CM | POA: Diagnosis present

## 2024-06-01 DIAGNOSIS — M6281 Muscle weakness (generalized): Secondary | ICD-10-CM | POA: Insufficient documentation

## 2024-06-01 NOTE — Therapy (Signed)
 OUTPATIENT PHYSICAL THERAPY THORACOLUMBAR TREATMENT / RECERTIFICATION   Progress Note Reporting Period 04/07/24 to 06/01/2024  See note below for Objective Data and Assessment of Progress/Goals.      Patient Name: Katelyn Fisher MRN: [id number] DOB:18-Feb-1951, 73 y.o., female Today's Date: 06/01/2024  END OF SESSION:  PT End of Session - 06/01/24 1727     Visit Number 10    Date for Recertification  06/02/24    PT Start Time 1530    PT Stop Time 1614    PT Time Calculation (min) 44 min    Activity Tolerance Patient tolerated treatment well;No increased pain    Behavior During Therapy [facility] for tasks assessed/performed                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   Anemia 11/07/2011   Anxiety    Arthritis    back   Asymptomatic postmenopausal status 10/01/2008   Qualifier: Diagnosis of   By: Josetta Corrigan      IMO SNOMED Dx Update Oct 2024     Chronic rhinitis 08/21/2021   Current use of beta blocker 06/30/2018   Depression    Diabetes mellitus without complication (HCC)    Endometrial cancer (HCC) 2015   Essential hypertension 08/13/2006   Qualifier: Diagnosis of   By: Nicholaus Channel         Family history of breast cancer    Family history of colon cancer    Family history of ovarian cancer    Family history of prostate cancer    Flank pain 09/14/2019   Gastroesophageal reflux disease without esophagitis 09/14/2010   Qualifier: Diagnosis of   By: Anice CMA, Darlene         GERD (gastroesophageal reflux disease)    H/O total hysterectomy with bilateral salpingo-oophorectomy (BSO) 06/24/2014   Formatting of this note might be different from the original.  s/p TRH/BSO on 06/23/2014, pLND with frozen path positive for lymphovascular space  invasion     History of colonic diverticulitis    History of colonic polyps 03/10/2008   Qualifier: Diagnosis of   By: Genie CMA LEODIS), Chick      IMO SNOMED Dx Update Oct 2024     History of endometrial cancer 02/03/2018   Hyperlipidemia    Hypertension    Hypothyroidism 06/01/2013   hypothyroid     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   Low back pain radiating to right leg 08/18/2012   Mass of left elbow 09/14/2019   Microcytosis 09/28/2016   Mild intermittent asthma without complication 01/26/2016   Moderate persistent asthma without complication 08/07/2018   Morbid obesity (HCC) 11/09/2014   Obstructive sleep apnea 11/15/2010   Osteoarthritis 11/15/2010   B/l knees     Osteopenia 09/2017   T score -1.2 FRAX 3.2% / 0.3%, 2025 -2.0 frax 10.9, 2.2%   Perennial allergic rhinitis 06/30/2018   Personal history of chemotherapy 2015   Port catheter in place 11/15/2015   Screening Mammogram 11/05/2017   Subacromial impingement of left shoulder 12/30/2018   Thyroid  disease 06/01/2013   Type 2 diabetes mellitus with diabetic neuropathy, without long-term current use of insulin (HCC) 10/25/2016   Type 2 diabetes mellitus with obesity 03/12/2011   Past Surgical History:  Procedure Laterality Date  ABDOMINAL HYSTERECTOMY  06/23/2014   [facility], TRH/BSO   CHOLECYSTECTOMY  1991   COLONOSCOPY     DILATION AND CURETTAGE OF UTERUS     IR REMOVAL TUN ACCESS W/ PORT W/O FL MOD SED  04/30/2017   POLYPECTOMY     TUBAL LIGATION     Patient Active Problem List   Diagnosis Date Noted   Senile osteoporosis 04/20/2024   Syncope and collapse 11/19/2023   Dyspnea on exertion 11/19/2023   Palpitations 11/19/2023   Cardiac murmur 11/19/2023   Allergy    Anxiety    Arthritis    Depression    Diabetes mellitus without complication (HCC)    GERD (gastroesophageal reflux disease)    History of colonic diverticulitis    Hypertension    Acute cough 12/19/2022   Chronic rhinitis 08/21/2021   Mass  of left elbow 09/14/2019   Flank pain 09/14/2019   Subacromial impingement of left shoulder 12/30/2018   Moderate persistent asthma without complication 08/07/2018   Current use of beta blocker 06/30/2018   Perennial allergic rhinitis 06/30/2018   Acute bronchitis due to other specified organisms 06/30/2018   History of endometrial cancer 02/03/2018   Screening Mammogram 11/05/2017   Osteopenia 09/2017   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   Type 2 diabetes mellitus with diabetic neuropathy, without long-term current use of insulin (HCC) 10/25/2016   Microcytosis 09/28/2016   Mild intermittent asthma without complication 01/26/2016   Port catheter in place 11/15/2015   Family history of breast cancer    Family history of ovarian cancer    Family history of prostate cancer    Family history of colon cancer    Morbid obesity (HCC) 11/09/2014   H/O total hysterectomy with bilateral salpingo-oophorectomy (BSO) 06/24/2014   Endometrial cancer (HCC) 05/20/2014   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   Personal history of chemotherapy 2015   Hypothyroidism 06/01/2013   Thyroid  disease 06/01/2013   Low back pain radiating to right leg 08/18/2012   Anemia 11/07/2011   Hyperlipidemia 06/15/2011   Type 2 diabetes mellitus with obesity 03/12/2011   Acute pain of right knee 01/27/2011   Obstructive sleep apnea 11/15/2010   Osteoarthritis 11/15/2010   Gastroesophageal reflux disease without esophagitis 09/14/2010   DIVERTICULITIS, HX OF 09/14/2010   Asymptomatic postmenopausal status 10/01/2008   History of colonic polyps 03/10/2008   Essential hypertension 08/13/2006    PCP: Frann Mabel Mt, DO   REFERRING PROVIDER: Frann Mabel Mt*   REFERRING DIAG: M54.50 (ICD-10-CM) - Acute bilateral low back pain without sciatica M25.512 (ICD-10-CM) - Acute pain of left shoulder M79.652 (ICD-10-CM) - Pain of left thigh  THERAPY DIAG:  Other low back pain  Pain in right  leg  Other abnormalities of gait and mobility  Muscle weakness (generalized)  Radiculopathy, lumbar region  Difficulty in walking, not elsewhere classified  RATIONALE FOR EVALUATION AND TREATMENT: Rehabilitation  ONSET DATE: 02/21/24  NEXT MD VISIT:    SUBJECTIVE:  SUBJECTIVE STATEMENT:  Patient reports worst pain over last week was 5/10.   Today 3/10.   Pain is more in the L lateral hip and thigh now.   Central low back pain seems to be better.    PAIN: Are you having pain? Yes: NPRS scale:  2/10 R low back Pain location: N/A Pain description: N/A Aggravating factors: Bending over Relieving factors: sitting with leg propped up  PERTINENT HISTORY:  Diabetes, HTN, depression, h/o endometrial CA 10 yrs ago, OA, anemia, obesity  PRECAUTIONS: Fall  RED FLAGS: None  WEIGHT BEARING RESTRICTIONS: No  FALLS:  Has patient fallen in last 6 months? Yes. Number of falls 1  LIVING ENVIRONMENT: Lives with: lives with their family and lives with their spouse Lives in: House/apartment Stairs: Yes: External: 2 steps; yes Has following equipment at home: Single point cane  OCCUPATION: retired [employer] services operational officer  PLOF: Independent with gait  PATIENT GOALS: get rid of the pain   OBJECTIVE: (objective measures completed at initial evaluation unless otherwise dated)  DIAGNOSTIC FINDINGS:  EXAM: LEFT SHOULDER - 2+ VIEW   COMPARISON:  None Available.   FINDINGS: There is no evidence of fracture or dislocation. There is no evidence of arthropathy. Rounded calcification is seen over greater tuberosity suggesting calcific tendinosis. Soft tissues are unremarkable.   IMPRESSION: Possible calcific tendinosis.  No acute abnormality seen.     Electronically Signed   By: Lynwood Landy Raddle  M.D.   On: 02/21/2024 14:27  EXAM: LUMBAR SPINE - COMPLETE 4+ VIEW   COMPARISON:  May 11, 2019.   FINDINGS: No fracture or significant spondylolisthesis. Minimal degenerative disc disease is noted at L2-3. Moderate degenerative disc disease is noted at L5-S1 hypertrophy of posterior facet joints is seen at L4-5 and L5-S1 secondary to degenerative change.   IMPRESSION: Multilevel degenerative changes as noted above. No acute abnormality seen.     Electronically Signed   By: Lynwood Landy Raddle M.D.   On: 02/21/2024 14:25EXAM: LEFT KNEE - COMPLETE 4 VIEW; LEFT TIBIA AND FIBULA - 2 VIEW; LEFT FEMUR 2 VIEWS   COMPARISON:  None Available.   FINDINGS: There are no findings of fracture or dislocation. No joint effusion. Mild tricompartmental degenerative changes of the knee. Mild subcutaneous soft tissue reticulations of the lower leg.   IMPRESSION: 1. No acute fracture or dislocation. 2. Mild subcutaneous soft tissue reticulations of the lower leg, which may represent edema or contusion.     Electronically Signed   By: Limin  Xu M.D.   On: 02/21/2024 14:48  10/10/19 - Lumbar MRI IMPRESSION: 1. Facet osteoarthritis at L3-4 and below with L4-5 and L5-S1 anterolisthesis. 2. Variable disc degeneration at L2-3 and below, advanced at L5-S1. 3. Spinal stenosis that is high-grade at L4-5 and moderate at L5-S1. 4. Moderate foraminal narrowing on the left at L4-5 and bilaterally at L5-S1.10/10/19 - Lumbar MRI IMPRESSION: 1. Facet osteoarthritis at L3-4 and below with L4-5 and L5-S1 anterolisthesis. 2. Variable disc degeneration at L2-3 and below, advanced at L5-S1. 3. Spinal stenosis that is high-grade at L4-5 and moderate at L5-S1. 4. Moderate foraminal narrowing on the left at L4-5 and bilaterally at L5-S1.  PATIENT SURVEYS:  LEFS = 28/80 ODI = 29 / 50 = 58.0 % QuickDash =  38.6 / 100 = 38.6 %   Modified Oswestry:  MODIFIED OSWESTRY DISABILITY SCALE  Date: 06/01/2024  Score  Pain intensity 2 =  Pain medication provides me with complete relief from pain.  2. Personal care (washing, dressing,  etc.) 1 =  I can take care of myself normally, but it increases my pain.  3. Lifting 4 = I can lift only very light weights  4. Walking 1 = Pain prevents me from walking more than 1 mile.  5. Sitting 1 =  I can only sit in my favorite chair as long as I like.  6. Standing 2 =  Pain prevents me from standing more than 1 hour  7. Sleeping 1 = I can sleep well only by using pain medication.  8. Social Life 2 = Pain prevents me from participating in more energetic activities (eg. sports, dancing).  9. Traveling 1 =  I can travel anywhere, but it increases my pain.  10. Employment/ Homemaking 2 = I can perform most of my homemaking/job duties, but pain prevents me from performing more physically stressful activities (eg, lifting, vacuuming).  Total 17/50 = 34%   Interpretation of scores: Score Category Description  0-20% Minimal Disability The patient can cope with most living activities. Usually no treatment is indicated apart from advice on lifting, sitting and exercise  21-40% Moderate Disability The patient experiences more pain and difficulty with sitting, lifting and standing. Travel and social life are more difficult and they may be disabled from work. Personal care, sexual activity and sleeping are not grossly affected, and the patient can usually be managed by conservative means  41-60% Severe Disability Pain remains the main problem in this group, but activities of daily living are affected. These patients require a detailed investigation  61-80% Crippled Back pain impinges on all aspects of the patient's life. Positive intervention is required  81-100% Bed-bound These patients are either bed-bound or exaggerating their symptoms  Bluford FORBES Zoe DELENA Karon DELENA, et al. Surgery versus conservative management of stable thoracolumbar fracture: the PRESTO feasibility RCT.  [place] ([place]): [employer]; 2021 Nov. [facility] Technology Assessment, No. 25.62.) Appendix 3, Oswestry Disability Index category descriptors. Available from: Findjewelers.cz  Minimally Clinically Important Difference (MCID) = 12.8%   SCREENING FOR RED FLAGS: Bowel or bladder incontinence: No Spinal tumors: No Cauda equina syndrome: No Compression fracture: No Abdominal aneurysm: No  COGNITION:  Overall cognitive status: Within functional limits for tasks assessed  SENSATION: WFL  POSTURE:  forward head; some R shift, lower L shoulder  PALPATION: TTP over the R paraspinals, SI, and piriformis areas  LUMBAR ROM:   Active  Eval 04/28/24 05/18/24  Flexion To knees;p! To mid shins; some p! To ankles; no pain increase  Extension 70%; feels better 90%;  better pain 100%;  no pain increase  Right lateral flexion To knee; some p! Just below knee To knee  Left lateral flexion To mid thigh; p! To knee To knee  Right rotation 70%; some p! 85% 100%  Left rotation 50%; more p! 85% 100%  (Blank rows = not tested)  MUSCLE LENGTH: Hamstrings: Right SLR 70 deg; Left SLR 40 deg Thomas test: Right NT deg; Left 10 deg Hamstrings: 90/90 is 20 deg on LLE ITB: mild tight L Piriformis: mild tight L Hip flexors: min tightness  LOWER EXTREMITY ROM:     Active  Right eval Left eval  Hip flexion    Hip extension    Hip abduction    Hip adduction    Hip internal rotation    Hip external rotation    Knee flexion 110 97  Knee extension    Ankle dorsiflexion    Ankle plantarflexion    Ankle inversion  Ankle eversion    (Blank rows = not tested)  LOWER EXTREMITY MMT:    MMT Right eval Left eval RLE 04/28/24 LLE 04/28/24 R/L 05/18/24 R/L 06/01/24  Hip flexion 5  5 5  5/5 5/5  Hip extension   4- 3 3/4 4/4-  Hip abduction 4  4 4  4/4+ 4/4  Hip adduction        Hip internal rotation 5 4- 5 4 5/5 5/5  Hip external rotation 5 4 5 5  5/5 5/5  Knee  flexion 5 4+ 5 5 5/5 5/5  Knee extension 5 4 5 5  5/5 5/5  Ankle dorsiflexion 5 5 5 5  5/5 5/5  Ankle plantarflexion 4 3      Ankle inversion        Ankle eversion         (Blank rows = not tested)  LUMBAR SPECIAL TESTS:  Straight leg raise test: Positive, Slump test: Positive, SI Compression/distraction test: Negative, and FABER test: Negative  FUNCTIONAL TESTS:  TBD  GAIT: Distance walked: into clinic Assistive device utilized: None Level of assistance: Complete Independence Gait pattern: limps on LLE, decreased L weight shift Comments:    TODAY'S TREATMENT:  06/01/24 THERAPEUTIC EXERCISE: To improve strength and endurance.  Demonstration, verbal and tactile cues throughout for technique. NuStep L5 x 7'  THERAPEUTIC ACTIVITIES: To improve functional performance.  Demonstration, verbal and tactile cues throughout for technique. Supine SLR hamstretch w/ PT assist x 1' x 2 BLE;    SLR LLE = 70 deg;  SLR RLE = 75 deg Seated hamstretch x 1' x 2 BLE Supine cross over piriformis stretch x 1' x 2 LLE SL hip abd x 20 LLE  SL clams x 20 LLE Resisted sidestepping blue TB at knees x 3 laps at counter  NEUROMUSCULAR RE-EDUCATION: To improve coordination, kinesthesia, posture, and proprioception. MFI walkouts x 4 laps each direction from [place] RTB  MANUAL THERAPY: To promote reduced pain utilizing myofascial release and percussion massage with massage gun. MFR to L IT band w/ foam roller;  Theragun to L piriformis  05/18/24 THERAPEUTIC EXERCISE: To improve strength and endurance.  Demonstration, verbal and tactile cues throughout for technique. Bike L0 x 7'  NEUROMUSCULAR RE-EDUCATION: To improve coordination, kinesthesia, and posture. Seated on dynadisc palloff press RTB x 10 each way Standing palloff press RTB x 10 each way MFI walkouts RTB x 4 laps at mat table RDL 5# x 2/10  (painful;  difficulty maintaining PPT) Supine PPT w/ marching x 20 BLE Supine PPT w/ LAQ x 20  BLE Supine peanut ball rollouts w PPT x 20 BLE  THERAPEUTIC ACTIVITIES: To improve functional performance.  Demonstration, verbal and tactile cues throughout for technique. Curl ups x 10 straight; x 10 L; x10 R Supine SLR ham stretch x 1' x 2 BLE Rechecked strength and lumbar ROM  MODALITIES: HP/IFC to low back in prone at machine default settings x 17-20 mA intensity x 15' for pain relief  05/05/24 THERAPEUTIC EXERCISE: To improve strength and endurance.  Demonstration, verbal and tactile cues throughout for technique. NuStep L5 x 8' SL hip abduction x 2/10 BLE Prone hip ext x 2/10 BLE THERAPEUTIC ACTIVITIES: To improve functional performance.  Demonstration, verbal and tactile cues throughout for technique. Lateral step ups 6' x 10 BLE Step ups 6' x 10 BLE Glute kickback 5lb x 10 BLE Squats x 10  PATIENT EDUCATION:  Education details: medbridge GO updated  Person educated: Patient Education method: Explanation, Demonstration, Verbal cues, Tactile  cues, and Handouts Education comprehension: verbalized understanding, verbal cues required, tactile cues required, and needs further education  HOME EXERCISE PROGRAM: Access Code: [email or web address] URL: [email or web address] Date: 06/01/2024 Prepared by: Garnette Montclair  Exercises - Supine Bridge with Resistance Band  - 1 x daily - 7 x weekly - 2-3 sets - 10 reps - Clamshell with Resistance  - 1 x daily - 7 x weekly - 2-3 sets - 10 reps - Supine Lower Trunk Rotation  - 1 x daily - 7 x weekly - 3 sets - 10 reps - Supine Piriformis Stretch with Leg Straight  - 1 x daily - 7 x weekly - 1 sets - 1 reps - 1 min hold - Lying Prone with 2 Pillows  - 1 x daily - 7 x weekly - 1 sets - 1 reps - 2-3 min hold - Prone Press Up  - 1 x daily - 7 x weekly - 3 sets - 10 reps - Plank on Knees  - 1 x daily - 7 x weekly - 2 sets - 10 reps - Cat Cow  - 1 x daily - 7 x weekly - 3 sets - 10 reps - Quadruped Alternating Leg Extensions  - 1 x  daily - 7 x weekly - 3 sets - 10 reps - Quadruped Fire Hydrant  - 1 x daily - 7 x weekly - 3 sets - 10 reps - Seated Hamstring Stretch  - 1 x daily - 7 x weekly - 1 sets - 2 reps - 1 min hold ASSESSMENT:  CLINICAL IMPRESSION:   Patient has been seen x 2 months PT for LBP starting after a mechanical fall.   She has made good progress to goals, but has not yet attained the goals that we have set for her.   However, she has good rehab potential to meet her goals.  Her outcome Modified Oswestry score has improved from 58% to 34% indiciating good subjective relief and improvement in function.   She reports her pain is more in the L hip and lateral thigh now rather than the central low back.   Her strength has also improved (see tables above), but hip abductor strength is lagging.   She still has TTP over the L piriformis and IT band areas and reports these areas are more painful than central low back now.   She still occasionally has central LBP, but it is more focused over the L hip.   Lumbar ROM has greatly improved and is not painful now, but the L hip is.  L SLR has improved from 40 to 70 degrees and is not positive for LBP.   Thinking her pain may be more sciatica than spinal issue.   PT remains necessary for pain, strength, HEP deficits.    Continue per POC.       EVAL: Katelyn Fisher is a 73 y.o. female who was referred to physical therapy for evaluation and treatment for LBP, L thigh pain, and L shoulder pain s/p mechanical fall on 02/21/24   Patient reports onset of low back pain and RLE beginning 02/21/24. Pain is worse with bending, twisting, and lifting.  She has been her for PT in the past for LBP w/ LLE radiculopathy and has an old MRI that shows foraminal stenosis.  Patient has deficits in lumbar ROM, L LE flexibility, LLE strength, abnormal posture, and TTP in the low back and L hip which are interfering with ADLs and are impacting quality of life.  On Modified Oswestry patient scored 29 / 50 =  58.0 %.  Zuleyka will benefit from skilled PT to address above deficits to improve mobility and activity tolerance with decreased pain interference. Patient is advised that we are going to focus on her LBP and LLE pain since this seems to be her bigger problem rather than the shoulder for now.   If the back pain resolves then we can evaluate her shoulder pain, but we cannot adequately treat these 2 problems at the same time so we will focus on one. Patient is agreeable to this plan.  OBJECTIVE IMPAIRMENTS: Abnormal gait, difficulty walking, decreased ROM, decreased strength, and pain.   ACTIVITY LIMITATIONS: lifting, bending, and locomotion level  PARTICIPATION LIMITATIONS: cleaning and laundry  PERSONAL FACTORS: Age, Fitness, Time since onset of injury/illness/exacerbation, and 3+ comorbidities:   Diabetes, HTN, depression, h/o endometrial CA 10 yrs ago, OA, anemia, obesityare also affecting patient's functional outcome.   REHAB POTENTIAL: Good  CLINICAL DECISION MAKING: Evolving/moderate complexity  EVALUATION COMPLEXITY: Moderate   GOALS: Goals reviewed with patient? Yes  SHORT TERM GOALS: Target date: 06/29/2024  Patient will be independent with initial HEP to improve outcomes and carryover.  Baseline: 100% PT assist required for correct completion Goal status: MET- 04/21/24  2.  Patient will report 25% improvement in low back pain to improve QOL. Baseline: 10/10 worst Goal status: MET- 04/15/24  3.  Patient will be independent with ongoing/advanced HEP for self-management at home.  Baseline: no advanced HEP yet 04/28/24:  advancing and updated 06/01/24:  still advancing:  Added seated hamstring stretch and quadruped fire hydrants today Goal status: IN PROGRESS-   LONG TERM GOALS: Target date: 07/27/2024   1.  Patient will report 50-75% improvement in low back pain to improve QOL.  Baseline:  06/01/24:  5/10 worst over last week Goal status:  IN PROGRESS   2.  Patient will  demonstrate full pain free lumbar ROM to perform ADLs.   Baseline: Refer to above lumbar ROM table 10/7 updated Goal status: MET--05/18/24  3.  Patient will demonstrate improved BLE strength to >/= 5/5 for improved stability and ease of mobility. Baseline: Refer to above LE MMT table 10/7:  see updated tables 05/18/24:   see updated tables above 06/01/24:  SEE UPDATED TABLES Goal status: IN PROGRESS  4. Patient will report </= 20% on Modified Oswestry (MCID = 12%) to demonstrate improved functional ability with decreased pain interference. Baseline: 58% 05/18/24:   04/23/24 23 / 50 = 46.0 % 06/01/24:  34% Goal status: IN PROGRESS-    PLAN:  PT FREQUENCY: 1-2x/week  PT DURATION: 8 weeks  PLANNED INTERVENTIONS: 97164- PT Re-evaluation, 97750- Physical Performance Testing, 97110-Therapeutic exercises, 97530- Therapeutic activity, 97112- Neuromuscular re-education, 97535- Self Care, 02859- Manual therapy, [id number]- Gait training, [phone]- Electrical stimulation (unattended), 97016- Vasopneumatic device, [id number]- Ultrasound, [id number]- Traction (mechanical), [id number]- Ionotophoresis 4mg /ml Dexamethasone , 79439 (1-2 muscles), 20561 (3+ muscles)- Dry Needling, Patient/Family education, Balance training, Stair training, Taping, Joint mobilization, Spinal mobilization, Cryotherapy, and Moist heat  PLAN FOR NEXT SESSION: patient is going to call to schedule future appointments.   She wants to try some on her own at home first, but will return if she is not continuing to improve.    If she returns would work on hip abductor strengthening in all positions with spine stabilization  Francisco Ostrovsky, PT 06/01/2024, 8:45 PM   Date of referral: see referral Referring provider: Wendling  Referring diagnosis? M54.50 (ICD-10-CM) - Acute bilateral low back pain without  sciatica M25.512 (ICD-10-CM) - Acute pain of left shoulder M79.652 (ICD-10-CM) - Pain of left thigh  Treatment diagnosis? (if different than  referring diagnosis) R26.89, M54.59, M79.604, M62.81  What was this (referring dx) caused by? Felton Hawks of Condition: Initial Onset (within last 3 months)   Laterality: Lt  Current Functional Measure Score: Back Index modified ODI =34%  Objective measurements identify impairments when they are compared to normal values, the uninvolved extremity, and prior level of function.  [x]  Yes  []  No  Objective assessment of functional ability: Moderate functional limitations   Briefly describe symptoms: RECERTIFICATION NOTE 06/01/24:  Patient has been seen x 2 months PT for LBP starting after a mechanical fall.   She has made good progress to goals, but has not yet attained the goals that we have set for her.   However, she has good rehab potential to meet her goals.  Her outcome Modified Oswestry score has improved from 58% to 34% indiciating good subjective relief and improvement in function.   She reports her pain is more in the L hip and lateral thigh now rather than the central low back.   Her strength has also improved (see tables above), but hip abductor strength is lagging.   She still has TTP over the L piriformis and IT band areas and reports these areas are more painful than central low back now.   She still occasionally has central LBP, but it is more focused over the L hip.   Lumbar ROM has greatly improved and is not painful now, but the L hip is.   Thinking her pain may be more sciatica than spinal issue.   PT remains necessary for pain, strength, HEP deficits.    Continue per POC.   How did symptoms start: fall  Average pain intensity:  Last 24 hours: 3/10  Past week: 5/10  How often does the pt experience symptoms? Intermittently  How much have the symptoms interfered with usual daily activities? Moderately  How has condition changed since care began at this facility? Better  In general, how is the patients overall health? Fair   BACK PAIN (STarT Back Screening Tool) Has  pain spread down the leg(s) at some time in the last 2 weeks? Yes Has there been pain in the shoulder or neck at some time in the last 2 weeks? Yes Has the pt only walked short distances because of back pain? no Has patient dressed more slowly because of back pain in the past 2 weeks? no Does patient think it's not safe for a person with this condition to be physically active? no Does patient have worrying thoughts a lot of the time? no Does patient feel back pain is terrible and will never get any better? no Has patient stopped enjoying things they usually enjoy? no

## 2024-06-02 ENCOUNTER — Other Ambulatory Visit: Payer: Self-pay

## 2024-06-03 ENCOUNTER — Other Ambulatory Visit (HOSPITAL_COMMUNITY): Payer: Self-pay

## 2024-06-03 ENCOUNTER — Other Ambulatory Visit: Payer: Self-pay

## 2024-06-09 ENCOUNTER — Other Ambulatory Visit: Payer: Self-pay

## 2024-06-09 NOTE — Progress Notes (Signed)
 Patient not sure if she wants to fill. She will contact office and call back if decides she wants. Dis-enrolling.

## 2024-06-11 ENCOUNTER — Other Ambulatory Visit: Payer: Self-pay

## 2024-06-15 ENCOUNTER — Other Ambulatory Visit (HOSPITAL_BASED_OUTPATIENT_CLINIC_OR_DEPARTMENT_OTHER): Payer: Self-pay

## 2024-07-20 ENCOUNTER — Encounter: Payer: Self-pay | Admitting: Oncology

## 2024-07-20 ENCOUNTER — Encounter (HOSPITAL_COMMUNITY): Payer: Self-pay | Admitting: Obstetrics and Gynecology

## 2024-07-20 ENCOUNTER — Other Ambulatory Visit (HOSPITAL_BASED_OUTPATIENT_CLINIC_OR_DEPARTMENT_OTHER): Payer: Self-pay

## 2024-07-20 ENCOUNTER — Other Ambulatory Visit: Payer: Self-pay | Admitting: Family Medicine

## 2024-07-20 DIAGNOSIS — E119 Type 2 diabetes mellitus without complications: Secondary | ICD-10-CM

## 2024-07-20 MED ORDER — RYBELSUS 14 MG PO TABS
14.0000 mg | ORAL_TABLET | Freq: Every day | ORAL | 1 refills | Status: AC
  Filled 2024-07-20: qty 90, 90d supply, fill #0

## 2024-07-20 MED ORDER — TRAMADOL HCL 50 MG PO TABS
50.0000 mg | ORAL_TABLET | Freq: Three times a day (TID) | ORAL | 0 refills | Status: AC | PRN
  Filled 2024-07-20: qty 60, 20d supply, fill #0

## 2024-07-21 ENCOUNTER — Other Ambulatory Visit: Payer: Self-pay

## 2024-07-21 ENCOUNTER — Encounter: Payer: Self-pay | Admitting: Obstetrics and Gynecology

## 2024-07-29 ENCOUNTER — Other Ambulatory Visit: Payer: Self-pay | Admitting: *Deleted

## 2024-07-29 MED ORDER — DENOSUMAB-BBDZ 60 MG/ML ~~LOC~~ SOSY: 60.0000 mg | PREFILLED_SYRINGE | SUBCUTANEOUS | Status: AC

## 2024-07-31 ENCOUNTER — Telehealth: Payer: Self-pay

## 2024-07-31 NOTE — Telephone Encounter (Addendum)
 [employer]UBBONTI  VOB initiated via [email or web address]  Next Prolia  inj DUE: NEW START

## 2024-08-04 ENCOUNTER — Ambulatory Visit: Admitting: Family Medicine

## 2024-08-04 ENCOUNTER — Other Ambulatory Visit: Payer: Self-pay | Admitting: Family Medicine

## 2024-08-04 ENCOUNTER — Other Ambulatory Visit (HOSPITAL_BASED_OUTPATIENT_CLINIC_OR_DEPARTMENT_OTHER): Payer: Self-pay

## 2024-08-04 ENCOUNTER — Encounter: Payer: Self-pay | Admitting: Family Medicine

## 2024-08-04 VITALS — BP 128/82 | HR 85 | Temp 97.8°F | Resp 20 | Wt 217.0 lb

## 2024-08-04 DIAGNOSIS — T7800XD Anaphylactic reaction due to unspecified food, subsequent encounter: Secondary | ICD-10-CM

## 2024-08-04 DIAGNOSIS — K219 Gastro-esophageal reflux disease without esophagitis: Secondary | ICD-10-CM

## 2024-08-04 DIAGNOSIS — J3089 Other allergic rhinitis: Secondary | ICD-10-CM

## 2024-08-04 DIAGNOSIS — T7800XA Anaphylactic reaction due to unspecified food, initial encounter: Secondary | ICD-10-CM

## 2024-08-04 DIAGNOSIS — J4540 Moderate persistent asthma, uncomplicated: Secondary | ICD-10-CM | POA: Diagnosis not present

## 2024-08-04 MED ORDER — ALBUTEROL SULFATE HFA 108 (90 BASE) MCG/ACT IN AERS
2.0000 | INHALATION_SPRAY | RESPIRATORY_TRACT | 1 refills | Status: AC | PRN
  Filled 2024-08-04: qty 6.7, 17d supply, fill #0

## 2024-08-04 NOTE — Progress Notes (Signed)
 "  [address] [place] [address] Dept: [phone]  FOLLOW UP NOTE  Patient ID: Katelyn Fisher, female    DOB: 02-29-1951  Age: 74 y.o. MRN: [id number] Date of Office Visit: 08/04/2024  Assessment  Chief Complaint: Asthma, Allergic Rhinitis  (R), Gastroesophageal Reflux, and Anaphylactic shock due to food (8/7)  HPI Katelyn Fisher is a 74 year old female who presents to the clinic for follow-up visit.  She was last seen in this clinic on 05/05/2024 by Arlean Mutter, FNP, for evaluation of asthma, allergic rhinitis, reflux, and food allergy to shellfish.  Discussed the use of AI scribe software for clinical note transcription with the patient, who gave verbal consent to proceed.  History of Present Illness Katelyn Fisher is a 74 year old female with asthma who presents for follow-up of her respiratory symptoms.  Her breathing has been generally stable, with only one episode necessitating the use of her inhaler, which she attributes to a possible viral infection and a sore throat. She used her inhaler effectively to maintain clear lungs. No shortness of breath or wheezing, but she experiences a dry cough occasionally, particularly in cold weather. She continues to use Symbicort  160 once daily, increasing to twice if needed, and uses her albuterol  inhaler infrequently as required.   Her allergy symptoms are manageable, with no rhinorrhea, nasal congestion, or postnasal drip. Occasional sneezing occurs, but not frequently. She uses levocetirizine as needed and has not recently used a nasal saline or nasal steroid spray.Her last environmental allergy skin testing was on 05/25/2010 that was positive to dust mite and negative to the remainder of the adult environmental panel.  She denies any issues with reflux and continues to use fruit gum for management.  She is not currently taking a medication to control reflux  She continues to avoid shellfish with no accidental ingestion or EpiPen   use since her last visit to this clinic.  EpiPen  set is up-to-date.  Her current medications are listed in the chart.  Drug Allergies:  Allergies[1]  Physical Exam: BP 128/82 (BP Location: Left Arm, Patient Position: Sitting, Cuff Size: Large)   Pulse 85   Temp 97.8 F (36.6 C) (Oral)   Resp 20   Wt 217 lb (98.4 kg)   SpO2 99%   BMI 36.11 kg/m    Physical Exam Vitals reviewed.  Constitutional:      Appearance: Normal appearance.  HENT:     Head: Normocephalic and atraumatic.     Right Ear: Tympanic membrane normal.     Left Ear: Tympanic membrane normal.     Nose:     Comments: Bilateral nares edematous and pale with thin clear nasal drainage noted.  Pharynx normal.  Ears normal.  Eyes normal.    Mouth/Throat:     Pharynx: Oropharynx is clear.  Eyes:     Conjunctiva/sclera: Conjunctivae normal.  Cardiovascular:     Rate and Rhythm: Normal rate and regular rhythm.     Heart sounds: Normal heart sounds. No murmur heard. Pulmonary:     Effort: Pulmonary effort is normal.     Breath sounds: Normal breath sounds.     Comments: Lungs clear to auscultation Musculoskeletal:        General: Normal range of motion.     Cervical back: Normal range of motion and neck supple.  Skin:    General: Skin is warm and dry.  Neurological:     Mental Status: She is alert and oriented to person, place,  and time.  Psychiatric:        Mood and Affect: Mood normal.        Behavior: Behavior normal.        Thought Content: Thought content normal.        Judgment: Judgment normal.     Diagnostics: FVC 1.99 which is 93% of predicted value, FEV1 1.47 which is 88% of predicted value.  Spirometry indicates normal ventilatory function.  Assessment and Plan: 1. Moderate persistent asthma without complication   2. Allergy with anaphylaxis due to food   3. Perennial allergic rhinitis   4. Gastroesophageal reflux disease without esophagitis     Meds ordered this encounter  Medications    albuterol  (PROAIR  HFA) 108 (90 Base) MCG/ACT inhaler    Sig: Inhale 2 puffs by mouth into the lungs every 4 (four) hours as needed for coughing or wheezing spells.    Dispense:  6.7 g    Refill:  1    Patient Instructions  Asthma Continue montelukast  (Singulair ) 10 mg once a day to prevent cough or wheeze Continue Symbicort  160-2 puffs once a day to prevent cough or wheeze Continue albuterol  2 puffs every 4 hours as needed for cough or wheeze OR Instead use albuterol  0.083% solution via nebulizer one unit vial every 4 hours as needed for cough or wheeze  You may use albuterol  2 puffs 5 to 15 minutes before activity to decrease cough or wheeze For asthma flare, increase Symbicort  160 to 2 puffs twice a day for 1 to 2 weeks or until cough and wheeze free, then return to original dosing  Allergic rhinitis Continue allergen avoidance measures directed toward dust mites as listed below Continue flonase  nasal spray 1-2 sprays each nostril once a day as needed for stuffy nose.  In the right nostril, point the applicator out toward the right ear. In the left nostril, point the applicator out toward the left ear Continue levocetirizine 5 mg one tablet once a day as needed for runny nose or itching May use saline nasal rinses as needed Consider updating your environmental allergy testing.  Remember to stop antihistamines for 3 days before your skin testing appointment.  Reflux Continue dietary and lifestyle modifications as listed below  Food allergy Continue to avoid shellfish.  In case of an allergic reaction, give Benadryl  50 mg  every 4 hours, and if life-threatening symptoms occur, inject with EpiPen  0.3 mg.  Call the clinic if this treatment plan is not working well for you  Follow up in 6 months or sooner if needed.  Return in about 6 months (around 02/01/2025), or if symptoms worsen or fail to improve.    Thank you for the opportunity to care for this patient.  Please do not hesitate  to contact me with questions.  Arlean Mutter, FNP [facility]          [1]  Allergies Allergen Reactions   Shellfish Allergy Hives, Shortness Of Breath and Swelling    Other Reaction(s): Unknown   Oxycodone Swelling    Facial swelling and tightness   Penicillins Swelling    Rash with welps   Iodine     Rash  Other Reaction(s): Unknown   Penicillin G     Other Reaction(s): Unknown   Iodinated Contrast Media Rash   Sulfa Antibiotics Rash   "

## 2024-08-04 NOTE — Patient Instructions (Addendum)
 Asthma Continue montelukast  (Singulair ) 10 mg once a day to prevent cough or wheeze Continue Symbicort  160-2 puffs once a day to prevent cough or wheeze Continue albuterol  2 puffs every 4 hours as needed for cough or wheeze OR Instead use albuterol  0.083% solution via nebulizer one unit vial every 4 hours as needed for cough or wheeze  You may use albuterol  2 puffs 5 to 15 minutes before activity to decrease cough or wheeze For asthma flare, increase Symbicort  160 to 2 puffs twice a day for 1 to 2 weeks or until cough and wheeze free, then return to original dosing  Allergic rhinitis Continue allergen avoidance measures directed toward dust mites as listed below Continue flonase  nasal spray 1-2 sprays each nostril once a day as needed for stuffy nose.  In the right nostril, point the applicator out toward the right ear. In the left nostril, point the applicator out toward the left ear Continue levocetirizine 5 mg one tablet once a day as needed for runny nose or itching May use saline nasal rinses as needed Consider updating your environmental allergy testing.  Remember to stop antihistamines for 3 days before your skin testing appointment.  Reflux Continue dietary and lifestyle modifications as listed below  Food allergy Continue to avoid shellfish.  In case of an allergic reaction, give Benadryl  50 mg  every 4 hours, and if life-threatening symptoms occur, inject with EpiPen  0.3 mg.  Call the clinic if this treatment plan is not working well for you  Follow up in 6 months or sooner if needed.   Control of Dust Mite Allergen Dust mites play a major role in allergic asthma and rhinitis. They occur in environments with high humidity wherever human skin is found. Dust mites absorb humidity from the atmosphere (ie, they do not drink) and feed on organic matter (including shed human and animal skin). Dust mites are a microscopic type of insect that you cannot see with the naked eye. High levels  of dust mites have been detected from mattresses, pillows, carpets, upholstered furniture, bed covers, clothes, soft toys and any woven material. The principal allergen of the dust mite is found in its feces. A gram of dust may contain 1,000 mites and 250,000 fecal particles. Mite antigen is easily measured in the air during house cleaning activities. Dust mites do not bite and do not cause harm to humans, other than by triggering allergies/asthma.  Ways to decrease your exposure to dust mites in your home:  1. Encase mattresses, box springs and pillows with a mite-impermeable barrier or cover  2. Wash sheets, blankets and drapes weekly in hot water (130 F) with detergent and dry them in a dryer on the hot setting.  3. Have the room cleaned frequently with a vacuum cleaner and a damp dust-mop. For carpeting or rugs, vacuuming with a vacuum cleaner equipped with a high-efficiency particulate air (HEPA) filter. The dust mite allergic individual should not be in a room which is being cleaned and should wait 1 hour after cleaning before going into the room.  4. Do not sleep on upholstered furniture (eg, couches).  5. If possible removing carpeting, upholstered furniture and drapery from the home is ideal. Horizontal blinds should be eliminated in the rooms where the person spends the most time (bedroom, study, television room). Washable vinyl, roller-type shades are optimal.  6. Remove all non-washable stuffed toys from the bedroom. Wash stuffed toys weekly like sheets and blankets above.  7. Reduce indoor humidity to less  than 50%. Inexpensive humidity monitors can be purchased at most hardware stores. Do not use a humidifier as can make the problem worse and are not recommended.

## 2024-08-10 NOTE — Telephone Encounter (Signed)
 Katelyn Fisher

## 2024-08-11 NOTE — Telephone Encounter (Signed)
 Katelyn Fisher

## 2024-08-11 NOTE — Telephone Encounter (Addendum)
 Referral was for [employer] . PA approved for [employer]  (buy and bill).

## 2024-08-11 NOTE — Telephone Encounter (Signed)
 Thank you. The summary of benefits said Prolia , so I wanted to make sure.

## 2024-08-11 NOTE — Telephone Encounter (Signed)
 This patient is to do Prolia  via [employer]? Just wanted to update the referral. Thanks.

## 2024-08-11 NOTE — Telephone Encounter (Signed)
 Buy/Bill (Office supplied medication)  Out-of-pocket cost due at time of clinic visit: $0  Number of injection/visits approved: 2  Primary: [employer] Co-insurance: 0% Admin fee co-insurance: 0%  Secondary: [employer] Co-insurance: plan will coordinate benefits and will consider 100% of remaining expenses. No deductible applies. Admin fee co-insurance:   Medical Benefit Details: Date Benefits were checked: 08/07/24 Deductible: NO/ Coinsurance: 0%/ Admin Fee: 0%  Prior Auth: APPROVED [facility]) PA# [id number] Expiration Date: 08/11/24-08/11/25  # of doses approved: 2 -----------------------------------------------------------------------  Patient NOT eligible for Copay Card. Copay Card can make patient's cost as little as $25. Link to apply: [email or web address]://[email or web address]  ** This summary of benefits is an estimation of the patient's out-of-pocket cost. Exact cost may very based on individual plan coverage.

## 2024-10-13 ENCOUNTER — Encounter: Admitting: Family Medicine

## 2025-02-02 ENCOUNTER — Ambulatory Visit: Admitting: Family Medicine

## 2025-04-22 ENCOUNTER — Ambulatory Visit
